# Patient Record
Sex: Female | Born: 1975 | Race: White | Hispanic: No | Marital: Married | Smoking: Never smoker
Health system: Southern US, Community
[De-identification: ages and names within clinical notes are randomized; demographics above are authoritative.]

## PROBLEM LIST (undated history)

## (undated) DIAGNOSIS — D242 Benign neoplasm of left breast: Secondary | ICD-10-CM

## (undated) DIAGNOSIS — Z1379 Encounter for other screening for genetic and chromosomal anomalies: Secondary | ICD-10-CM

## (undated) DIAGNOSIS — Z803 Family history of malignant neoplasm of breast: Secondary | ICD-10-CM

## (undated) DIAGNOSIS — R897 Abnormal histological findings in specimens from other organs, systems and tissues: Secondary | ICD-10-CM

## (undated) DIAGNOSIS — Z9189 Other specified personal risk factors, not elsewhere classified: Secondary | ICD-10-CM

## (undated) DIAGNOSIS — G43909 Migraine, unspecified, not intractable, without status migrainosus: Secondary | ICD-10-CM

## (undated) DIAGNOSIS — Z1502 Genetic susceptibility to malignant neoplasm of ovary: Secondary | ICD-10-CM

## (undated) DIAGNOSIS — Z9221 Personal history of antineoplastic chemotherapy: Secondary | ICD-10-CM

## (undated) DIAGNOSIS — Z8041 Family history of malignant neoplasm of ovary: Secondary | ICD-10-CM

## (undated) DIAGNOSIS — Z1501 Genetic susceptibility to malignant neoplasm of breast: Secondary | ICD-10-CM

## (undated) DIAGNOSIS — N6320 Unspecified lump in the left breast, unspecified quadrant: Secondary | ICD-10-CM

## (undated) DIAGNOSIS — Z923 Personal history of irradiation: Secondary | ICD-10-CM

## (undated) DIAGNOSIS — Z1589 Genetic susceptibility to other disease: Secondary | ICD-10-CM

## (undated) DIAGNOSIS — C50919 Malignant neoplasm of unspecified site of unspecified female breast: Secondary | ICD-10-CM

## (undated) DIAGNOSIS — Z1509 Genetic susceptibility to other malignant neoplasm: Secondary | ICD-10-CM

## (undated) DIAGNOSIS — I10 Essential (primary) hypertension: Secondary | ICD-10-CM

## (undated) HISTORY — DX: Family history of malignant neoplasm of breast: Z80.3

## (undated) HISTORY — DX: Migraine, unspecified, not intractable, without status migrainosus: G43.909

## (undated) HISTORY — PX: BREAST EXCISIONAL BIOPSY: SUR124

## (undated) HISTORY — DX: Unspecified lump in the left breast, unspecified quadrant: N63.20

## (undated) HISTORY — DX: Genetic susceptibility to other disease: Z15.89

## (undated) HISTORY — DX: Abnormal histological findings in specimens from other organs, systems and tissues: R89.7

## (undated) HISTORY — DX: Genetic susceptibility to malignant neoplasm of breast: Z15.01

## (undated) HISTORY — DX: Benign neoplasm of left breast: D24.2

## (undated) HISTORY — DX: Genetic susceptibility to other malignant neoplasm: Z15.09

## (undated) HISTORY — DX: Encounter for other screening for genetic and chromosomal anomalies: Z13.79

## (undated) HISTORY — DX: Other specified personal risk factors, not elsewhere classified: Z91.89

## (undated) HISTORY — DX: Genetic susceptibility to malignant neoplasm of ovary: Z15.02

## (undated) HISTORY — DX: Family history of malignant neoplasm of ovary: Z80.41

---

## 1998-08-18 HISTORY — PX: BREAST SURGERY: SHX581

## 2005-04-27 ENCOUNTER — Inpatient Hospital Stay: Payer: Self-pay

## 2008-10-30 ENCOUNTER — Observation Stay: Payer: Self-pay

## 2008-11-08 ENCOUNTER — Inpatient Hospital Stay: Payer: Self-pay | Admitting: Obstetrics & Gynecology

## 2010-01-20 ENCOUNTER — Encounter (INDEPENDENT_AMBULATORY_CARE_PROVIDER_SITE_OTHER): Payer: Self-pay | Admitting: *Deleted

## 2010-07-19 NOTE — Letter (Signed)
Summary: Nadara Eaton letter  [place] at [facility]  [address] [place], [place] [address]   Phone: [phone]  Fax: [phone]       01/20/2010 MRN: [id number]  [facility] [address] [place], [place]  [address]  Dear Ms. [facility] [place], and [place] announce the retirement of Arta Silence, M.D., from full-time practice at the [facility] office effective December 16, 2009 and his plans of returning part-time.  It is important to Dr. Hetty Ely and to our practice that you understand that [facility] has seven physicians in our office for your health care needs.  We will continue to offer the same exceptional care that you have today.    Dr. Hetty Ely has spoken to many of you about his plans for retirement and returning part-time in the fall.   We will continue to work with you through the transition to schedule appointments for you in the office and meet the high standards that [facility] is committed to.   Again, it is with great pleasure that we share the news that Dr. Hetty Ely will return to [facility] at [facility] in October of 2011 with a reduced schedule.    If you have any questions, or would like to request an appointment with one of our physicians, please call us at [phone] and press the option for Scheduling an appointment.  We take pleasure in providing you with excellent patient care and look forward to seeing you at your next office visit.  Our [facility] Physicians are:  Tillman Abide, M.D. Laurita Quint, M.D. Roxy Manns, M.D. Kerby Nora, M.D. Hannah Beat, M.D. Ruthe Mannan, M.D. We proudly welcomed Raechel Ache, M.D. and Eustaquio Boyden, M.D. to the practice in July/August 2011.  Sincerely,  [facility]

## 2013-07-02 ENCOUNTER — Ambulatory Visit: Payer: Self-pay

## 2013-07-23 ENCOUNTER — Ambulatory Visit: Payer: Self-pay | Admitting: General Surgery

## 2013-07-30 ENCOUNTER — Ambulatory Visit: Payer: Self-pay | Admitting: General Surgery

## 2013-07-30 ENCOUNTER — Ambulatory Visit (INDEPENDENT_AMBULATORY_CARE_PROVIDER_SITE_OTHER): Payer: BC Managed Care – PPO | Admitting: General Surgery

## 2013-07-30 ENCOUNTER — Encounter: Payer: Self-pay | Admitting: General Surgery

## 2013-07-30 ENCOUNTER — Other Ambulatory Visit: Payer: BC Managed Care – PPO

## 2013-07-30 VITALS — BP 128/84 | HR 82 | Resp 14 | Ht 67.0 in | Wt 165.0 lb

## 2013-07-30 DIAGNOSIS — N63 Unspecified lump in unspecified breast: Secondary | ICD-10-CM

## 2013-07-30 DIAGNOSIS — D249 Benign neoplasm of unspecified breast: Secondary | ICD-10-CM

## 2013-07-30 NOTE — Patient Instructions (Addendum)
Continue self breast exams. Call office for any new breast issues or concerns.  Patient's surgery has been scheduled for 08-08-13 at [facility].

## 2013-07-30 NOTE — Progress Notes (Signed)
Patient ID: Denise [name], female   DOB: 1976-02-05, 38 y.o.   MRN: [id number]  Chief Complaint  Patient presents with  . Breast Problem    mammogram, left breast lump    HPI Denise [name] is a 38 y.o. female.  who presents for a breast evaluation. The most recent mammogram was done on 06-20-13. Left breast ultrasound was 07-02-13. The patient is accompanied by her husband, Jaqlyn Gruenhagen, who was present for the interview and exam.   Patient does perform regular self breast checks and gets regular mammograms done.  States she can feel a lump in left breast since about November. Does not feel it has changed in size. Denies any breast injuries or pain. Recovering from upper respiratory sinus congestion.  The patient has 2 daughters, age 43 and 30. She nursed each without difficulty.  HPI  No past medical history on file.  Past Surgical History  Procedure Laterality Date  . Breast surgery Right March 2000    benign    Family History  Problem Relation Age of Onset  . Cancer Paternal Grandmother     breast  . Cancer Maternal Aunt     breast    Social History History  Substance Use Topics  . Smoking status: Never Smoker   . Smokeless tobacco: Not on file  . Alcohol Use: Yes     Comment: occasionally    No Known Allergies  Current Outpatient Prescriptions  Medication Sig Dispense Refill  . TRI-SPRINTEC 0.18/0.215/0.25 MG-35 MCG tablet Take 1 tablet by mouth daily.       No current facility-administered medications for this visit.    Review of Systems Review of Systems  Constitutional: Negative.   Respiratory: Negative.   Cardiovascular: Negative.     Blood pressure 128/84, pulse 82, resp. rate 14, height 5\' 7"  (1.702 m), weight 165 lb (74.844 kg), last menstrual period 07/13/2013.  Physical Exam Physical Exam  Constitutional: She is oriented to person, place, and time. She appears well-developed and well-nourished.  Neck: Neck supple.  Cardiovascular: Normal  rate, regular rhythm and normal heart sounds.   Pulmonary/Chest: Effort normal and breath sounds normal. Right breast exhibits no inverted nipple, no mass, no nipple discharge, no skin change and no tenderness. Left breast exhibits mass. Left breast exhibits no inverted nipple, no nipple discharge, no skin change and no tenderness.    Well heal circular areolar scar 12 to 3 o'clock  Right breast Left breast 4 CFN a 1.5 cm nodule  Lymphadenopathy:    She has no cervical adenopathy.    She has no axillary adenopathy.  Neurological: She is alert and oriented to person, place, and time.  Skin: Skin is warm and dry.    Data Reviewed Original mammogram dated 06/20/2013 showed dense breast with a suggestion of 2 nodular densities in the left. BI-RAD-0.  Focal spot compression views and ultrasound dated 07/02/2013 confirmed to well-circumscribed nodules. The palpable abnormality at 4:00 measuring 1.0 x 1.3 x 1.5 cm and at the 3:00 position, slightly deeper in the breast parenchyma a 0.8 x 1.8 x 2.1 cm nodules. These were thought to likely represent fibroadenomas.  Assessment    Left breast fibroadenoma. Past history right breast benign nodule excision.    Plan    Options for management were reviewed including 1) core biopsy of the larger lesion, was scheduled observation of both lesions; 2) core biopsy of the smaller, palpable lesion, 3) observation as recommended by the radiologist or 4) surgical excision  of both lesions. The patient has expressed a strong desire to proceed to excision.  The risks associated with surgery including those of bleeding, infection and less likely breast deformity were reviewed.  She expressed an interest in having a sedation, that reason the procedure would be completed as an outpatient at the hospital.    Patient's surgery has been scheduled for 08-08-13 at [facility].   [name] 07/31/2013, 8:40 AM

## 2013-07-31 ENCOUNTER — Other Ambulatory Visit: Payer: Self-pay | Admitting: General Surgery

## 2013-07-31 DIAGNOSIS — D249 Benign neoplasm of unspecified breast: Secondary | ICD-10-CM | POA: Insufficient documentation

## 2013-08-08 ENCOUNTER — Ambulatory Visit: Payer: Self-pay | Admitting: General Surgery

## 2013-08-08 DIAGNOSIS — N63 Unspecified lump in unspecified breast: Secondary | ICD-10-CM

## 2013-08-08 DIAGNOSIS — D249 Benign neoplasm of unspecified breast: Secondary | ICD-10-CM

## 2013-08-08 DIAGNOSIS — N6089 Other benign mammary dysplasias of unspecified breast: Secondary | ICD-10-CM | POA: Insufficient documentation

## 2013-08-08 HISTORY — PX: BREAST SURGERY: SHX581

## 2013-08-11 ENCOUNTER — Encounter: Payer: Self-pay | Admitting: General Surgery

## 2013-08-12 ENCOUNTER — Encounter: Payer: Self-pay | Admitting: General Surgery

## 2013-08-12 ENCOUNTER — Telehealth: Payer: Self-pay | Admitting: *Deleted

## 2013-08-12 LAB — PATHOLOGY REPORT

## 2013-08-12 NOTE — Progress Notes (Signed)
Patient ID: Denise [name], female   DOB: 12/25/1975, 38 y.o.   MRN: [id number]   Pathology report from the 08/08/2013 left breast biopsies showed a fibroadenoma at 3:00 with focal epithelial atypia. Margins were clear. The lesion at 4:00 was a simple fibroadenoma. The retroareolar tissue showed columnar cell changes without atypia.  The patient's [name] model risk for malignancy as 2% over the next 5 years, 43% lifetime. Will review the possibility of antiestrogen therapy at the time of her followup exam.  Family history is notable for a hand with breast cancer, no first degree relatives.

## 2013-08-12 NOTE — Telephone Encounter (Signed)
Message copied by Carson Myrtle on Tue Aug 12, 2013  2:04 PM ------      Message from: [place], [place]      Created: Tue Aug 12, 2013  1:55 PM       5 the patient that her recently completed breast biopsies of February 20 were all benign. Followup as scheduled. Thank you ------

## 2013-08-12 NOTE — Telephone Encounter (Signed)
Notified patient as instructed, patient pleased. Discussed follow-up appointments, patient agrees. Pt aware of weather forecast and may call sooner if needed to change appointment.

## 2013-08-14 ENCOUNTER — Ambulatory Visit: Payer: BC Managed Care – PPO | Admitting: General Surgery

## 2013-08-18 ENCOUNTER — Encounter: Payer: Self-pay | Admitting: General Surgery

## 2013-08-18 ENCOUNTER — Ambulatory Visit (INDEPENDENT_AMBULATORY_CARE_PROVIDER_SITE_OTHER): Payer: BC Managed Care – PPO | Admitting: General Surgery

## 2013-08-18 VITALS — BP 118/76 | HR 78 | Resp 12 | Ht 67.0 in | Wt 168.0 lb

## 2013-08-18 DIAGNOSIS — D249 Benign neoplasm of unspecified breast: Secondary | ICD-10-CM

## 2013-08-18 DIAGNOSIS — N6089 Other benign mammary dysplasias of unspecified breast: Secondary | ICD-10-CM

## 2013-08-18 NOTE — Patient Instructions (Addendum)
Follow up appointment to be announced. The patient is aware to use a heating pad as needed for comfort.

## 2013-08-18 NOTE — Progress Notes (Signed)
Patient ID: Denise [name], female   DOB: [birth year], 38 y.o.   MRN: [id number]  Chief Complaint  Patient presents with  . Routine Post Op    left breast excision    HPI Denise [name] is a 38 y.o. female here today for her post op left breast excision done on 08/08/13. Patient states she is doing well . HPI  No past medical history on file.  Past Surgical History  Procedure Laterality Date  . Breast surgery Right March 2000    benign  . Breast surgery Left 08/08/13    excision    Family History  Problem Relation Age of Onset  . Cancer Paternal Grandmother     breast  . Cancer Maternal Aunt     breast  . Bone cancer Paternal Aunt     Social History History  Substance Use Topics  . Smoking status: Never Smoker   . Smokeless tobacco: Never Used  . Alcohol Use: Yes     Comment: occasionally    No Known Allergies  Current Outpatient Prescriptions  Medication Sig Dispense Refill  . TRI-SPRINTEC 0.18/0.215/0.25 MG-35 MCG tablet Take 1 tablet by mouth daily.      Marland Kitchen HYDROcodone-acetaminophen (NORCO/VICODIN) 5-325 MG per tablet Take 1 tablet by mouth as needed.       No current facility-administered medications for this visit.    Review of Systems Review of Systems  Constitutional: Negative.   Respiratory: Negative.   Cardiovascular: Negative.     Blood pressure 118/76, pulse 78, resp. rate 12, height 5\' 7"  (1.702 m), weight 168 lb (76.204 kg), last menstrual period 07/13/2013.  Physical Exam Physical Exam  Constitutional: She is oriented to person, place, and time. She appears well-developed and well-nourished.  Pulmonary/Chest:  Left breast incisions looks clean and healing well. A little bruising noticed.   Neurological: She is alert and oriented to person, place, and time.  Skin: Skin is warm and dry.    Data Reviewed Pathology of the breast tissue showed evidence of flat epithelial hyperplasia with atypia at the 3:00 adjacent to the largest  fibroadenoma with negative margins. Retroareolar tissue at the 4:00 position showed columnar cell changes without atypia and the palpable mass which prompted the initial evaluation showed a simple fibroadenoma the 4:00 position.  [name] model risk analysis shows that she has a five-year risk of malignancy of 2%, lifetime risk of 40% based on her 3 previous biopsies and family history.  Assessment    Doing well status post breast biopsy.    Plan    The NSABP P.-1 chemoprevention trial involved woman's age over 31. The patient meets criteria for chemoprevention. The anticipated 50% reduction from 2% 1% is modest, especially considering it would be necessary for the patient to make use of an alternate form of contraception (presently using OCP) as well as assuming a mild risk of DVT, not greater from that of her present birth control pills. The risk of uterine cancers exceptionally low and menstruating women so this is not so much an issue. There is a potential for vasomotor instability.  We'll obtain a copy of the pathology from the biopsy done of the contralateral breast about 12 years ago when the patient forward to contact information.  We'll present her case at tumor Board for further recommendations regarding genetic screening. (The patient has a maternal grandmother and maternal aunt with breast cancer, but no first degree relatives. There is no family history of colon, pancreas, melanoma or ovarian cancer).  [name] 08/19/2013, 4:55 PM

## 2013-08-21 ENCOUNTER — Telehealth: Payer: Self-pay | Admitting: *Deleted

## 2013-08-21 NOTE — Telephone Encounter (Signed)
Notified patient as instructed, patient pleased. Discussed follow-up appointments, patient agrees  

## 2013-08-21 NOTE — Telephone Encounter (Signed)
Message copied by Carson Myrtle on Thu Aug 21, 2013  4:22 PM ------      Message from: [place], [place]      Created: Thu Aug 21, 2013  2:45 PM       Please notify the patient we received the [place] biopsy report and that I reviewed her situation with the senior medical oncologist for his recommendations re: anti-estrogen treatment.      Would recommend that she have BRCA testing completed and if negative, no additional treatment except careful clinical follow up.        You can arrange if she is amenable. Thanks. ------

## 2013-08-22 ENCOUNTER — Encounter: Payer: Self-pay | Admitting: General Surgery

## 2013-08-22 NOTE — Progress Notes (Signed)
Patient ID: Denise [name], female   DOB: 02-01-1976, 38 y.o.   MRN: [id number]   Outside pathology from [place] dated March 2000 showed multiple fibroadenomas in the right breast measuring from 0.7-2.8 cm.  February 2015 left breast biopsy showed 2 fibroadenomas measuring 1.6 and 2.8 cm respectively. The larger lesion was associated with focal epithelial atypia, margins negative. A separate area biopsy showed columnar cell changes without atypia.  The patient's family history for breast cancer is been clarified: Paternal grandmother, paternal aunt, maternal aunt.  [name] model risk for the patient is to percent 5 years, 40% lifetime. This puts her in a high risk group.  The case was reviewed with the head of medical oncology. No strong recommendation for tamoxifen therapy at this time, but BRCA testing was encouraged.  The patient has been contacted and is amenable to BRCA testing. This will be done next week with a nurse with the patient to be notified of the results when available.

## 2013-08-26 ENCOUNTER — Ambulatory Visit (INDEPENDENT_AMBULATORY_CARE_PROVIDER_SITE_OTHER): Payer: BC Managed Care – PPO | Admitting: *Deleted

## 2013-08-26 DIAGNOSIS — Z1379 Encounter for other screening for genetic and chromosomal anomalies: Secondary | ICD-10-CM

## 2013-08-26 DIAGNOSIS — N6089 Other benign mammary dysplasias of unspecified breast: Secondary | ICD-10-CM

## 2013-08-26 HISTORY — DX: Encounter for other screening for genetic and chromosomal anomalies: Z13.79

## 2013-08-26 NOTE — Progress Notes (Signed)
BRACA testing completed

## 2013-08-26 NOTE — Patient Instructions (Signed)
As scheduled

## 2013-09-05 ENCOUNTER — Telehealth: Payer: Self-pay | Admitting: General Surgery

## 2013-09-05 NOTE — Telephone Encounter (Signed)
Notified BRCA testing negative.

## 2013-09-09 ENCOUNTER — Encounter: Payer: Self-pay | Admitting: General Surgery

## 2014-07-24 ENCOUNTER — Ambulatory Visit: Payer: Self-pay

## 2014-08-11 ENCOUNTER — Ambulatory Visit (INDEPENDENT_AMBULATORY_CARE_PROVIDER_SITE_OTHER): Payer: BC Managed Care – PPO | Admitting: General Surgery

## 2014-08-11 ENCOUNTER — Encounter: Payer: Self-pay | Admitting: General Surgery

## 2014-08-11 VITALS — BP 124/72 | HR 76 | Resp 12 | Ht 67.0 in | Wt 178.0 lb

## 2014-08-11 DIAGNOSIS — D249 Benign neoplasm of unspecified breast: Secondary | ICD-10-CM

## 2014-08-11 NOTE — Progress Notes (Signed)
Patient ID: Denise [name], female   DOB: 02/23/1976, 39 y.o.   MRN: [id number]  Chief Complaint  Patient presents with  . Follow-up    mammogram    HPI Denise [name] is a 39 y.o. female who presents for a breast evaluation. The most recent mammogram was done on 07/24/14 . Patient states she has not feel any lumps in her breast. She states a little tenderness since her mammogram. Patient does perform regular self breast checks and gets regular mammograms done.    HPI  No past medical history on file.  Past Surgical History  Procedure Laterality Date  . Breast surgery Right March 2000    benign  . Breast surgery Left 08/08/13    excision    Family History  Problem Relation Age of Onset  . Cancer Paternal Grandmother     breast  . Cancer Maternal Aunt     breast  . Breast cancer Paternal Aunt 15    Social History History  Substance Use Topics  . Smoking status: Never Smoker   . Smokeless tobacco: Never Used  . Alcohol Use: Yes     Comment: occasionally    No Known Allergies  Current Outpatient Prescriptions  Medication Sig Dispense Refill  . TRI-SPRINTEC 0.18/0.215/0.25 MG-35 MCG tablet Take 1 tablet by mouth daily.     No current facility-administered medications for this visit.    Review of Systems Review of Systems  Constitutional: Negative.   Respiratory: Negative.   Cardiovascular: Negative.     Blood pressure 124/72, pulse 76, resp. rate 12, height 5' 7" (1.702 m), weight 178 lb (80.74 kg), last menstrual period 07/21/2014.  Physical Exam Physical Exam  Constitutional: She is oriented to person, place, and time. She appears well-developed and well-nourished.  Neck: Neck supple.  Cardiovascular: Normal rate, regular rhythm and normal heart sounds.   Pulmonary/Chest: Effort normal and breath sounds normal. Right breast exhibits no inverted nipple, no mass, no nipple discharge, no skin change and no tenderness. Left breast exhibits no inverted  nipple, no mass, no nipple discharge, no skin change and no tenderness.  Scar left breast 3 o'clock with slight irregularity and thickening  Lymphadenopathy:    She has no cervical adenopathy.    She has no axillary adenopathy.  Neurological: She is alert and oriented to person, place, and time.  Skin: Skin is warm and dry.    Data Reviewed PCP notes reviewed. Reported lifetime risk of breast cancer using the [place] reported 47%. Difficult to believe and a BRCA negative patient.  Recent mammogram and ultrasound completed [place] dated 07/24/2014 ported multiple nodular densities, questionable new versus old.  Ultrasound showed multiple lesions thought likely to be benign. Multiple recommendations for additional imaging studies and/or biopsy and/or observation. BI-RADS-3.  Assessment   Previous biopsies for fibroadenoma. Negative BRCA. Flat atypical changes on prior biopsy. (Margins of resection negative).    Plan    Options for management were reviewed: 1) serial ultrasound exam versus 2) core biopsy versus 3) antiestrogen therapy versus 4) bilateral breast MRI versus 5) bilateral prophylactic mastectomy. Opportunity for plastic surgical consultation was recommended, although I would not recommend mastectomy based on previous biopsy results. The patient was accompanied today by her husband who was present for the interview and exam.      Recommend follow up in 6 months. Discussed biopsy the areas of concern or anti-hormone therapy.  She will call back if she decides to complete a biopsy sooner.  PCP:  Denise [name] 08/12/2014, 7:23 PM

## 2014-08-11 NOTE — Patient Instructions (Signed)
Continue self breast exams. Call office for any new breast issues or concerns. 

## 2014-08-19 ENCOUNTER — Telehealth: Payer: Self-pay | Admitting: *Deleted

## 2014-08-19 NOTE — Telephone Encounter (Signed)
Please arrange for office ultrasound guided biopsy of both breasts when convenient for patient. She can be contacted any time (she is not in the classroom). Thanks.

## 2014-08-19 NOTE — Telephone Encounter (Signed)
Pt called and has decided that she wants to go ahead and have a BX on both breast. She wanted to talk to you about this and then schedule something.

## 2014-08-19 NOTE — Telephone Encounter (Signed)
Patient desires to proceed to biopsy of the areas recently identified. Will plan for a core biopsy of both to establish pathology and proceed from there.

## 2014-08-20 NOTE — Telephone Encounter (Signed)
Patient has been scheduled for 09-08-14 at 10 am. This is per patient's request to wait until after upcoming scheduled trip out of town with husband.

## 2014-09-08 ENCOUNTER — Encounter: Payer: Self-pay | Admitting: General Surgery

## 2014-09-08 ENCOUNTER — Other Ambulatory Visit: Payer: BC Managed Care – PPO

## 2014-09-08 ENCOUNTER — Ambulatory Visit (INDEPENDENT_AMBULATORY_CARE_PROVIDER_SITE_OTHER): Payer: BC Managed Care – PPO | Admitting: General Surgery

## 2014-09-08 VITALS — BP 174/68 | HR 74 | Resp 16 | Ht 67.0 in | Wt 174.0 lb

## 2014-09-08 DIAGNOSIS — N641 Fat necrosis of breast: Secondary | ICD-10-CM

## 2014-09-08 DIAGNOSIS — N6320 Unspecified lump in the left breast, unspecified quadrant: Secondary | ICD-10-CM

## 2014-09-08 DIAGNOSIS — N63 Unspecified lump in breast: Secondary | ICD-10-CM | POA: Diagnosis not present

## 2014-09-08 DIAGNOSIS — D249 Benign neoplasm of unspecified breast: Secondary | ICD-10-CM

## 2014-09-08 DIAGNOSIS — N6310 Unspecified lump in the right breast, unspecified quadrant: Secondary | ICD-10-CM

## 2014-09-08 HISTORY — PX: BREAST BIOPSY: SHX20

## 2014-09-08 NOTE — Progress Notes (Addendum)
Patient ID: Denise [name], female   DOB: Jun 23, 1975, 39 y.o.   MRN: [id number]  Chief Complaint  Patient presents with  . Procedure    bilateral biopsy    HPI [name] is a 39 y.o. female here today for a bilateral  breast biopsy. The patient is accompanied by her husband who was present for the procedure. HPI  No past medical history on file.  Past Surgical History  Procedure Laterality Date  . Breast surgery Right March 2000    benign  . Breast surgery Left 08/08/13    excision    Family History  Problem Relation Age of Onset  . Cancer Paternal Grandmother     breast  . Cancer Maternal Aunt     breast  . Breast cancer Paternal Aunt 40    Social History History  Substance Use Topics  . Smoking status: Never Smoker   . Smokeless tobacco: Never Used  . Alcohol Use: Yes     Comment: occasionally    No Known Allergies  Current Outpatient Prescriptions  Medication Sig Dispense Refill  . TRI-SPRINTEC 0.18/0.215/0.25 MG-35 MCG tablet Take 1 tablet by mouth daily.     No current facility-administered medications for this visit.    Review of Systems Review of Systems  Constitutional: Negative.   Respiratory: Negative.   Cardiovascular: Negative.     Blood pressure 174/68, pulse 74, resp. rate 16, height 5\' 7"  (1.702 m), weight 174 lb (78.926 kg), last menstrual period 07/21/2014.  Physical Exam Physical Exam  Pulmonary/Chest:      Data Reviewed Prior mammograms and ultrasound.  Assessment    Likely fat necrosis at the site of her previous wide excision in the 3:00 position of the left breast.  Additional fibroadenomas in the right and left breast.    Plan    The biopsy procedure was reviewed. The patient was amenable to proceed. In the left breast at the 4:00 position, 6 cm from the nipple a multilobulated area that was hypoechoic without significant acoustic enhancement or shadowing measuring in aggregate 1.18 cm in length and up to  0.8 cm in width was identified. At the 3:00 position of the left breast, 3 cm from the nipple a anechoic area with posterior acoustic enhancement at the medial aspect of her previous incision site was identified. On aspiration after alcohol prep this yielded 5 mL of this case fatty fluid consistent with liquefied fat. The area completely resolved.  Biopsy of the left breast lesion was completed making use of a 14-gauge Finesse vacuum-assisted device after the instillation of 10 mL of 0.5% Xylocaine with 0.25% Marcaine with 1-200,000 epinephrine.. This was passed under ultrasound guidance and core samples obtained from both dominant areas within the lesion. A postbiopsy clip was placed.  In the right breast a 0.86 x 1.1 x 1.12 cm well-circumscribed hypoechoic lesion with no stricture acoustic enhancement was identified. This was biopsied making use of a 14-gauge vacuum-assisted Finesse device after the instillation of 10 mL of 0.5% Xylocaine with 0.25% Marcaine with 1-200,000 epinephrine.. . Multiple core samples were obtained from both the superior and inferior aspect of the area. Postbiopsy clip placed.  Skin incisions were closed with benzoin and Steri-Strips followed by Telfa and Tegaderm dressing.  The patient tolerated the procedure well with minimal discomfort and no bleeding.  Postbiopsy instructions were provided.  The patient will follow-up with the nurse next week for wound evaluation. She will be contacted by phone tomorrow when pathology is available.  [name] 09/09/2014, 6:34 AM

## 2014-09-08 NOTE — Patient Instructions (Signed)

## 2014-09-09 DIAGNOSIS — N641 Fat necrosis of breast: Secondary | ICD-10-CM | POA: Insufficient documentation

## 2014-09-15 ENCOUNTER — Ambulatory Visit: Payer: BC Managed Care – PPO

## 2014-09-16 ENCOUNTER — Encounter: Payer: Self-pay | Admitting: General Surgery

## 2014-09-16 ENCOUNTER — Ambulatory Visit (INDEPENDENT_AMBULATORY_CARE_PROVIDER_SITE_OTHER): Payer: BC Managed Care – PPO | Admitting: General Surgery

## 2014-09-16 VITALS — BP 128/72 | HR 78 | Resp 12 | Ht 67.0 in | Wt 179.0 lb

## 2014-09-16 DIAGNOSIS — N6320 Unspecified lump in the left breast, unspecified quadrant: Secondary | ICD-10-CM

## 2014-09-16 DIAGNOSIS — N63 Unspecified lump in breast: Secondary | ICD-10-CM | POA: Diagnosis not present

## 2014-09-16 NOTE — Progress Notes (Signed)
Patient ID: Denise [name], female   DOB: 1975/07/14, 39 y.o.   MRN: [id number]  Chief Complaint  Patient presents with  . Routine Post Op    bilateral breast biopsy    HPI Denise [name] is a 39 y.o. female here today to discuss the path results from her bilateral breast biopsy done on 09/08/14.  HPI  No past medical history on file.  Past Surgical History  Procedure Laterality Date  . Breast surgery Right March 2000    benign  . Breast surgery Left 08/08/13    excision  . Breast biopsy Bilateral 09/08/14    Family History  Problem Relation Age of Onset  . Cancer Paternal Grandmother     breast  . Cancer Maternal Aunt     breast  . Breast cancer Paternal Aunt 19    Social History History  Substance Use Topics  . Smoking status: Never Smoker   . Smokeless tobacco: Never Used  . Alcohol Use: Yes     Comment: occasionally    No Known Allergies  Current Outpatient Prescriptions  Medication Sig Dispense Refill  . TRI-SPRINTEC 0.18/0.215/0.25 MG-35 MCG tablet Take 1 tablet by mouth daily.     No current facility-administered medications for this visit.    Review of Systems Review of Systems  Constitutional: Negative.   Respiratory: Negative.   Cardiovascular: Negative.     Blood pressure 128/72, pulse 78, resp. rate 12, height 5\' 7"  (1.702 m), weight 179 lb (81.194 kg).  Physical Exam Physical Exam  Constitutional: She is oriented to person, place, and time. She appears well-developed and well-nourished.  Eyes: Conjunctivae are normal. No scleral icterus.  Neck: Neck supple.  Cardiovascular: Normal rate, regular rhythm and normal heart sounds.   Pulmonary/Chest: Effort normal and breath sounds normal.  Lymphadenopathy:    She has no cervical adenopathy.  Neurological: She is alert and oriented to person, place, and time.  Skin: Skin is warm and dry.   Patient here today for follow up post bilateral breast biopsy. Minimal bruising noted.  The  patient is aware that a heating pad may be used for comfort as needed.     Data Reviewed Breast, left, needle core biopsy, 4 o'clock - BREAST TISSUE WITH PSEUDOANGIOMATOUS HYPERPLASIA AND BANAL SPINDLE CELL PROLIFERATION  Assessment    Likely fibroadenoma, formal excision recommended with conservative margins on phone review with the pathologist.  Plan    Discussed left breast excision. 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  While this is a superficial lesion, the patient reports that the core biopsy was is far she would like to go under local anesthesia or only receiving oral medications. For that reason I will be completed as an outpatient with sedation.    Patient's surgery has been scheduled for 09-23-14 at [facility].   PCP:  Deliah Boston 09/17/2014, 1:46 PM

## 2014-09-16 NOTE — Patient Instructions (Addendum)
Patient to have a left breast excision at [facility]   Patient's surgery has been scheduled for 09-23-14 at [facility].

## 2014-09-17 ENCOUNTER — Other Ambulatory Visit: Payer: Self-pay | Admitting: General Surgery

## 2014-09-17 DIAGNOSIS — C50419 Malignant neoplasm of upper-outer quadrant of unspecified female breast: Secondary | ICD-10-CM | POA: Insufficient documentation

## 2014-09-17 DIAGNOSIS — Z170 Estrogen receptor positive status [ER+]: Secondary | ICD-10-CM | POA: Insufficient documentation

## 2014-09-17 DIAGNOSIS — N6320 Unspecified lump in the left breast, unspecified quadrant: Secondary | ICD-10-CM

## 2014-09-17 DIAGNOSIS — C50412 Malignant neoplasm of upper-outer quadrant of left female breast: Secondary | ICD-10-CM | POA: Insufficient documentation

## 2014-09-18 HISTORY — PX: BREAST EXCISIONAL BIOPSY: SUR124

## 2014-09-23 ENCOUNTER — Ambulatory Visit: Admit: 2014-09-23 | Disposition: A | Discharge status: Home / Self Care | Payer: Self-pay | Admitting: General Surgery

## 2014-09-23 DIAGNOSIS — D242 Benign neoplasm of left breast: Secondary | ICD-10-CM | POA: Diagnosis not present

## 2014-09-23 HISTORY — PX: BREAST SURGERY: SHX581

## 2014-09-25 ENCOUNTER — Telehealth: Payer: Self-pay | Admitting: General Surgery

## 2014-09-25 NOTE — Telephone Encounter (Signed)
Notified path benign. Report minimal pain.  Follow up as scheduled.

## 2014-09-28 ENCOUNTER — Encounter: Payer: Self-pay | Admitting: General Surgery

## 2014-09-30 ENCOUNTER — Ambulatory Visit: Payer: BC Managed Care – PPO | Admitting: General Surgery

## 2014-09-30 ENCOUNTER — Encounter: Payer: Self-pay | Admitting: General Surgery

## 2014-09-30 ENCOUNTER — Ambulatory Visit (INDEPENDENT_AMBULATORY_CARE_PROVIDER_SITE_OTHER): Payer: BC Managed Care – PPO | Admitting: General Surgery

## 2014-09-30 DIAGNOSIS — D249 Benign neoplasm of unspecified breast: Secondary | ICD-10-CM

## 2014-09-30 NOTE — Patient Instructions (Signed)
Bilateral diagnostic mammograms in October 2016

## 2014-09-30 NOTE — Progress Notes (Signed)
Patient ID: Denise [name], female   DOB: 07-Jun-1976, 39 y.o.   MRN: [id number]  Chief Complaint  Patient presents with  . Follow-up    left breast excision     HPI Denise [name] is a 39 y.o. female here today for her post op left breast excision done on 09/23/14. Patient states she is doing well.  HPI  No past medical history on file.  Past Surgical History  Procedure Laterality Date  . Breast surgery Right March 2000    benign  . Breast surgery Left 08/08/13    excision  . Breast biopsy Bilateral 09/08/14  . Breast surgery Right 09/23/14    excision    Family History  Problem Relation Age of Onset  . Cancer Paternal Grandmother     breast  . Cancer Maternal Aunt     breast  . Breast cancer Paternal Aunt 18    Social History History  Substance Use Topics  . Smoking status: Never Smoker   . Smokeless tobacco: Never Used  . Alcohol Use: Yes     Comment: occasionally    No Known Allergies  Current Outpatient Prescriptions  Medication Sig Dispense Refill  . TRI-SPRINTEC 0.18/0.215/0.25 MG-35 MCG tablet Take 1 tablet by mouth daily.     No current facility-administered medications for this visit.    Review of Systems Review of Systems  Constitutional: Negative.   Respiratory: Negative.   Cardiovascular: Negative.     Blood pressure 124/80, pulse 78, resp. rate 12, height 5\' 7"  (1.702 m), weight 179 lb (81.194 kg).  Physical Exam Physical Exam  Constitutional: She is oriented to person, place, and time. She appears well-developed and well-nourished.  Pulmonary/Chest:    Left breast excision looks clean and healing well.   Neurological: She is alert and oriented to person, place, and time.  Skin: Skin is warm.    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 No residual fibroadenomatous tissue. No atypia.  Assessment    Doing well post biopsy.    Plan    Original evaluation with the area of focal alumni  cell atypia year ago included a discussion of chemoprevention. It was an estimated 1% overall improvement, and this was thought to be overwhelmed by the 1% risk of DVT/PE.  Patient to return in October 2016 bilateral diagnostic mammograms in October 2016     PCP:  Myrene Buddy, [name] 09/30/2014, 10:03 AM

## 2014-10-10 NOTE — Op Note (Signed)
PATIENT NAME:  Denise [name], Denise [name] MR#:  [id number] DATE OF BIRTH:  1976-02-21  DATE OF PROCEDURE:  08/08/2013  PREOPERATIVE DIAGNOSIS: Multiple left breast fibroadenomas.   POSTOPERATIVE DIAGNOSIS: Multiple left breast fibroadenomas.   OPERATIVE PROCEDURE: Excision of left breast fibroadenomas at the 3 o'clock and 4 o'clock position.   SURGEON: Hervey Ard, MD   ANESTHESIA: General by LMA, Marcaine 0.5% with 1:200,000 units epinephrine 27 mL local infiltration.   ESTIMATED BLOOD LOSS: Minimal.   CLINICAL NOTE: This 39 year old woman developed a palpable mass in the 4 o'clock position of the left breast. Subsequent evaluation suggested a second lesion at the 3 o'clock position that was deeper and nonpalpable. She desired excision.   DESCRIPTION OF PROCEDURE: With the patient under adequate general anesthesia, the breast was prepped with ChloraPrep and draped. Ultrasound was used to identify the area at the 3 o'clock position, approximately 4 cm from the nipple. A radial incision was made from the edge of the areola and carried down through the skin and subcutaneous tissue with hemostasis achieved by electrocautery. A multilobulated fibroadenoma was excised, orientated, and sent fresh per protocol. Palpation in the retroareolar space at the 4 o'clock position did not show a clearly definable area of thickening, but a 2 x 2 x 2 volume of tissue was removed and sent as retroareolar tissue. The 4 o'clock lesion was peripherally located 6 cm from the nipple. This was palpated and confirmed by ultrasound. A small radial incision overlying the mass was made and the area was excised and sent fresh for histology. The wounds were closed after assuring good hemostasis making use of 2-0 Vicryl figure-of-eight sutures. Skin incisions were closed with 4-0 Vicryl subcuticular sutures. Benzoin, Steri-Strips, Telfa, and Tegaderm dressings were applied. Telfa, fluff gauze, and the patient's bra were then applied  for dressing. She tolerated the procedure well.  ____________________________ [name], MD jwb:sb D: 08/08/2013 15:46:26 ET T: 08/08/2013 16:27:17 ET JOB#: [id number]  cc: [name], MD, <Dictator> Phillips Goulette Amedeo Kinsman MD ELECTRONICALLY SIGNED 08/08/2013 17:42

## 2014-10-12 LAB — SURGICAL PATHOLOGY

## 2014-10-18 NOTE — Op Note (Signed)
PATIENT NAME:  Denise [name], Denise [name] MR#:  [id number] DATE OF BIRTH:  September 07, 1975  DATE OF PROCEDURE:  09/23/2014  PREOPERATIVE DIAGNOSIS:  Left breast mass.   POSTOPERATIVE DIAGNOSIS:  Left breast mass.  OPERATIVE PROCEDURE:  Left breast biopsy with ultrasound guidance.   SURGEON:  [name], MD   ANESTHESIA:  Attended local, 10 mL of 0.5% Marcaine with 1:200,000 units of epinephrine.   ESTIMATED BLOOD LOSS:  Minimal.   CLINICAL NOTE:  This 39 year old woman had a new mammographic finding of a nodular density in the left breast. Ultrasound showed a multilobulated area at the 4 o'clock position, and biopsy completed on 09/09/2014, showed left breast tissue with pseudoangiomatous hyperplasia, as well as spindle cell proliferation thought to possibly represent a fibroadenoma or less likely a myofibroblastic tumor. Conservative resection had been recommended in discussion with the pathologist.   The patient is brought to the operating room at this time for planned excision.   OPERATIVE NOTE:  After adequate sedation, the breast was prepped with ChloraPrep and draped. The area at the 4 o'clock position, 6 cm from the nipple was again easily identified and the area marked. Local anesthesia was infiltrated and was well tolerated. A radial incision was made over the nodular area. The skin was incised sharply. The remaining dissection was completed with electrocautery. The nodular area was excised, orientated, and sent fresh for routine histology. The wound was approximated with 2-0 Vicryl to the breast parenchyma and then a running 4-0 Vicryl subcuticular suture for the skin. Benzoin, Steri-Strips, Telfa, and Tegaderm dressing was then applied.   The patient tolerated the procedure well and was taken to the recovery room in stable condition.   ____________________________ [name], MD jwb:nb D: 09/23/2014 21:42:36 ET T: 09/24/2014 06:25:57 ET JOB#: [id number]  cc: [name], MD, <Dictator> Deirdre Evener. Copland, PA Shiloh Swopes Amedeo Kinsman MD ELECTRONICALLY SIGNED 09/24/2014 19:05

## 2015-01-26 ENCOUNTER — Other Ambulatory Visit: Payer: Self-pay

## 2015-01-26 DIAGNOSIS — D242 Benign neoplasm of left breast: Secondary | ICD-10-CM

## 2015-02-18 ENCOUNTER — Ambulatory Visit: Payer: Self-pay

## 2015-02-18 ENCOUNTER — Other Ambulatory Visit: Payer: Self-pay

## 2015-02-19 ENCOUNTER — Other Ambulatory Visit: Payer: Self-pay | Admitting: General Surgery

## 2015-02-19 ENCOUNTER — Ambulatory Visit
Admission: RE | Admit: 2015-02-19 | Discharge: 2015-02-19 | Disposition: A | Discharge status: Home / Self Care | Payer: BC Managed Care – PPO | Source: Ambulatory Visit | Attending: General Surgery | Admitting: General Surgery

## 2015-02-19 DIAGNOSIS — N63 Unspecified lump in breast: Secondary | ICD-10-CM | POA: Insufficient documentation

## 2015-02-19 DIAGNOSIS — Z09 Encounter for follow-up examination after completed treatment for conditions other than malignant neoplasm: Secondary | ICD-10-CM | POA: Diagnosis present

## 2015-02-19 DIAGNOSIS — D242 Benign neoplasm of left breast: Secondary | ICD-10-CM | POA: Diagnosis present

## 2015-02-19 DIAGNOSIS — Z803 Family history of malignant neoplasm of breast: Secondary | ICD-10-CM | POA: Insufficient documentation

## 2015-02-19 DIAGNOSIS — Z808 Family history of malignant neoplasm of other organs or systems: Secondary | ICD-10-CM | POA: Insufficient documentation

## 2015-02-25 ENCOUNTER — Ambulatory Visit (INDEPENDENT_AMBULATORY_CARE_PROVIDER_SITE_OTHER): Payer: BC Managed Care – PPO | Admitting: General Surgery

## 2015-02-25 ENCOUNTER — Encounter: Payer: Self-pay | Admitting: General Surgery

## 2015-02-25 VITALS — BP 132/74 | HR 72 | Resp 16 | Ht 67.0 in | Wt 174.0 lb

## 2015-02-25 DIAGNOSIS — N63 Unspecified lump in breast: Secondary | ICD-10-CM | POA: Diagnosis not present

## 2015-02-25 DIAGNOSIS — N6320 Unspecified lump in the left breast, unspecified quadrant: Secondary | ICD-10-CM

## 2015-02-25 DIAGNOSIS — R928 Other abnormal and inconclusive findings on diagnostic imaging of breast: Secondary | ICD-10-CM | POA: Diagnosis not present

## 2015-02-25 NOTE — Patient Instructions (Addendum)
The patient is aware to call back for any questions or concerns.  Patient's surgery has been scheduled for 03-16-15 at [facility].

## 2015-02-25 NOTE — Progress Notes (Signed)
Patient ID: Denise [name], female   DOB: [birth year], 39 y.o.   MRN: [id number]  Chief Complaint  Patient presents with  . Follow-up    mammogram 02/19/15    HPI Denise [name] is a 39 y.o. female who presents for a breast evaluation. The most recent left breast mammogram and ultrasound was done on 02/19/15. Patient typically performs regular self breast checks and gets regular mammograms done. Patient states there are no new breast problems. The patient had undergone biopsies of the left breast earlier this year, and recently completed her first post biopsy imaging. She was distressed by the radiologist report, and especially stressed because the mother of a dear friend, Denise [name] had died that very same day from advanced breast cancer. The patient was accompanied today by her husband, Denise [name], who was present for the interview and exam.   HPI  No past medical history on file.  Past Surgical History  Procedure Laterality Date  . Breast surgery Right March 2000    benign fibroadenoma  . Breast surgery Left 08/08/13    excision  . Breast surgery Left 09/23/14    excision  . Breast biopsy Bilateral 09/08/14    neg    Family History  Problem Relation Age of Onset  . Cancer Paternal Grandmother     breast  . Cancer Maternal Aunt     breast  . Breast cancer Paternal Aunt 31    Social History Social History  Substance Use Topics  . Smoking status: Never Smoker   . Smokeless tobacco: Never Used  . Alcohol Use: Yes     Comment: occasionally    No Known Allergies  Current Outpatient Prescriptions  Medication Sig Dispense Refill  . TRI-SPRINTEC 0.18/0.215/0.25 MG-35 MCG tablet Take 1 tablet by mouth daily.     No current facility-administered medications for this visit.    Review of Systems Review of Systems  Constitutional: Negative.   Respiratory: Negative.   Cardiovascular: Negative.     Blood pressure 132/74, pulse 72, resp. rate 16, height _0  (1.702  m), weight 174 lb (78.926 kg), last menstrual period 01/31/2015.  Physical Exam Physical Exam  Constitutional: She is oriented to person, place, and time. She appears well-developed and well-nourished.  HENT:  Mouth/Throat: Oropharynx is clear and moist.  Eyes: Conjunctivae are normal. No scleral icterus.  Neck: Neck supple.  Cardiovascular: Normal rate, regular rhythm and normal heart sounds.   Pulmonary/Chest: Effort normal and breath sounds normal. Right breast exhibits no inverted nipple, no mass, no nipple discharge, no skin change and no tenderness. Left breast exhibits no inverted nipple, no mass, no nipple discharge, no skin change and no tenderness.    Lymphadenopathy:    She has no cervical adenopathy.    She has no axillary adenopathy.       Left: No supraclavicular adenopathy present.  Neurological: She is alert and oriented to person, place, and time.  Skin: Skin is warm and dry.  Psychiatric: Her behavior is normal.    Data Reviewed Left breast mammogram dated 02/19/2015 was reviewed as well as the report.: Biopsy clip noted in the 4:00 position of the left breast. Low-attenuation area in the lower outer quadrant unchanged.  Ultrasound at the 4:00 position, 6 cm from the left breast reported a 5 x 9 x 12 mm area which corresponds to the mammographic location of the March 2016 excision. (Biopsy site confirmed at the time of excisional biopsy completed on 09/23/2014). Periareolar intraductal solid hypoechoic  non-shadowing mass at 3:00 measuring 5 x 9 x 9 cm. (Site corresponds to location of aspiration site from 09/08/2014 at the 3:00 position of the left breast at which time liquefied fat was aspirated.) Incidentally noted a posterior lower outer quadrant mass in the left breast which is reported stable 6 month. BI-RADS-4.   Assessment    Patient with a history of multiple fibroadenomas of the left breast as well as confirmed benign papilloma of the right  breast.  Biopsy evidence of removal of the original site in the lower outer quadrant of the left breast based on identification of the biopsy cavity, albeit without the marking clip which may have migrated.  Retroareolar deformity at the site of previous excision of retroareolar tissue with findings of columnar cell changes.  Incidentally identified nodular density, 1 cm oval mass.    Plan    The patient had previously been classified at high risk for breast cancer based on ISIS algorithm with her GYN PCP. She is BRCA negative. Based on 4 previous biopsies (not including reexcision) and the findings of atypia, her 5 year [name] model risk is 2.3%, 32% lifetime risk. We had discussed chemoprevention at the time of the April 2016 visit, but with an estimated 1% overall risk reduction this was thought to be overshadowed by 1% risk of DVT/pulomonary embolus.  When the patient was originally evaluated she had toyed with the idea of prophylactic mastectomy. As this time, she is not pushing in this direction.  We'll arrange to have both the previously placed biopsy clip as well as the solid nodular density located with wires prior to excision. We'll assess the retroareolar area at the time of surgery, anticipating this is likely postsurgical change.      Plan left breast biopsy with needle localization at [facility] Day Surgery.  Patient's surgery has been scheduled for 03-16-15 at [facility]   PCP: Denise Perfect, PA  Denise [name] 02/26/2015, 8:22 AM

## 2015-02-26 ENCOUNTER — Encounter: Payer: Self-pay | Admitting: General Surgery

## 2015-02-26 DIAGNOSIS — R928 Other abnormal and inconclusive findings on diagnostic imaging of breast: Secondary | ICD-10-CM | POA: Insufficient documentation

## 2015-02-26 NOTE — H&P (Signed)
HPI  Denise [name] is a 39 y.o. female who presents for a breast evaluation. The most recent left breast mammogram and ultrasound was done on 02/19/15. Patient typically performs regular self breast checks and gets regular mammograms done. Patient states there are no new breast problems.  The patient had undergone biopsies of the left breast earlier this year, and recently completed her first post biopsy imaging. She was distressed by the radiologist report, and especially stressed because the mother of a dear friend, [name] had died that very same day from advanced breast cancer.  The patient was accompanied today by her husband, Linna Hoff, who was present for the interview and exam.  HPI  No past medical history on file.  Past Surgical History   Procedure  Laterality  Date   .  Breast surgery  Right  March 2000     benign fibroadenoma   .  Breast surgery  Left  08/08/13     excision   .  Breast surgery  Left  09/23/14     excision   .  Breast biopsy  Bilateral  09/08/14     neg    Family History   Problem  Relation  Age of Onset   .  Cancer  Paternal Grandmother      breast   .  Cancer  Maternal Aunt      breast   .  Breast cancer  Paternal Aunt  67    Social History  Social History   Substance Use Topics   .  Smoking status:  Never Smoker   .  Smokeless tobacco:  Never Used   .  Alcohol Use:  Yes      Comment: occasionally    No Known Allergies  Current Outpatient Prescriptions   Medication  Sig  Dispense  Refill   .  TRI-SPRINTEC 0.18/0.215/0.25 MG-35 MCG tablet  Take 1 tablet by mouth daily.      No current facility-administered medications for this visit.    Review of Systems  Review of Systems  Constitutional: Negative.  Respiratory: Negative.  Cardiovascular: Negative.   Blood pressure 132/74, pulse 72, resp. rate 16, height _0  (1.702 m), weight 174 lb (78.926 kg), last menstrual period 01/31/2015.  Physical Exam  Physical Exam  Constitutional: She is  oriented to person, place, and time. She appears well-developed and well-nourished.  HENT:  Mouth/Throat: Oropharynx is clear and moist.  Eyes: Conjunctivae are normal. No scleral icterus.  Neck: Neck supple.  Cardiovascular: Normal rate, regular rhythm and normal heart sounds.  Pulmonary/Chest: Effort normal and breath sounds normal. Right breast exhibits no inverted nipple, no mass, no nipple discharge, no skin change and no tenderness. Left breast exhibits no inverted nipple, no mass, no nipple discharge, no skin change and no tenderness.    Lymphadenopathy:  She has no cervical adenopathy.  She has no axillary adenopathy.  Left: No supraclavicular adenopathy present.  Neurological: She is alert and oriented to person, place, and time.  Skin: Skin is warm and dry.  Psychiatric: Her behavior is normal.   Data Reviewed  Left breast mammogram dated 02/19/2015 was reviewed as well as the report.: Biopsy clip noted in the 4:00 position of the left breast. Low-attenuation area in the lower outer quadrant unchanged.  Ultrasound at the 4:00 position, 6 cm from the left breast reported a 5 x 9 x 12 mm area which corresponds to the mammographic location of the March 2016 excision. (Biopsy site confirmed  at the time of excisional biopsy completed on 09/23/2014).  Periareolar intraductal solid hypoechoic non-shadowing mass at 3:00 measuring 5 x 9 x 9 cm. (Site corresponds to location of aspiration site from 09/08/2014 at the 3:00 position of the left breast at which time liquefied fat was aspirated.)  Incidentally noted a posterior lower outer quadrant mass in the left breast which is reported stable 6 month. BI-RADS-4.  Assessment   Patient with a history of multiple fibroadenomas of the left breast as well as confirmed benign papilloma of the right breast.  Biopsy evidence of removal of the original site in the lower outer quadrant of the left breast based on identification of the biopsy cavity,  albeit without the marking clip which may have migrated.  Retroareolar deformity at the site of previous excision of retroareolar tissue with findings of columnar cell changes.  Incidentally identified nodular density, 1 cm oval mass.   Plan   The patient had previously been classified at high risk for breast cancer based on ISIS algorithm with her GYN PCP. She is BRCA negative.  Based on 4 previous biopsies (not including reexcision) and the findings of atypia, her 5 year [name] model risk is 2.3%, 32% lifetime risk.  We had discussed chemoprevention at the time of the April 2016 visit, but with an estimated 1% overall risk reduction this was thought to be overshadowed by 1% risk of DVT/pulomonary embolus.  When the patient was originally evaluated she had toyed with the idea of prophylactic mastectomy. As this time, she is not pushing in this direction.  We'll arrange to have both the previously placed biopsy clip as well as the solid nodular density located with wires prior to excision. We'll assess the retroareolar area at the time of surgery, anticipating this is likely postsurgical change.   Plan left breast biopsy with needle localization at Same Day Surgery.  Patient's surgery has been scheduled for 03-16-15 at [facility].  PCP: Ardeth Perfect, PA  [name]  02/26/2015, 8:22 AM

## 2015-03-01 ENCOUNTER — Telehealth: Payer: Self-pay | Admitting: *Deleted

## 2015-03-01 ENCOUNTER — Telehealth: Payer: Self-pay | Admitting: General Surgery

## 2015-03-01 ENCOUNTER — Other Ambulatory Visit: Payer: Self-pay | Admitting: General Surgery

## 2015-03-01 DIAGNOSIS — R928 Other abnormal and inconclusive findings on diagnostic imaging of breast: Secondary | ICD-10-CM

## 2015-03-01 NOTE — Telephone Encounter (Signed)
-----   Message from [name], MD sent at 02/26/2015  8:59 AM EDT ----- Please notify radiology that both the previously placed biopsy clip as well as the new nodular density in the lower outer quadrant of the left breast need to be localized. Thank you

## 2015-03-01 NOTE — Telephone Encounter (Signed)
Patient asked to call back regarding MRI appropriateness.

## 2015-03-01 NOTE — Telephone Encounter (Signed)
Christy at the [facility] has been notified and needle localization has been scheduled accordingly.

## 2015-03-01 NOTE — Telephone Encounter (Signed)
Patient notified to be at the [facility] day of surgery, 03-16-15 at 9 am. She verbalizes understanding.   Also, questioning if she needs to have a breast MRI completed prior to surgery.

## 2015-03-01 NOTE — Telephone Encounter (Signed)
The patient had questions regarding whether an MRI would be of benefit prior to planned biopsy of the left breast. I think there is about a 5% chance that the MRI which shows something meaningful, 20% chance that she will wind up with an additional biopsy that was not malignant and a 75% chance that no new lesions would be appreciated. It would not change plans for the presently scheduled biopsy.  I'm not opposed to the study, but the patient is aware of a high false positive rate.  She'll consider her options and notify me in the near future. This will allow adequate time for scheduling and biopsy prior to her planned open surgical biopsy at the end of this month.

## 2015-03-02 ENCOUNTER — Telehealth: Payer: Self-pay | Admitting: *Deleted

## 2015-03-02 DIAGNOSIS — R928 Other abnormal and inconclusive findings on diagnostic imaging of breast: Secondary | ICD-10-CM

## 2015-03-02 DIAGNOSIS — N63 Unspecified lump in unspecified breast: Secondary | ICD-10-CM

## 2015-03-02 NOTE — Telephone Encounter (Signed)
Patient wanted to let you know that she wants to go ahead to proceed with the breast MRI. Also she wanted to let you know that she has been singing "meat loaf" all night!

## 2015-03-02 NOTE — Telephone Encounter (Signed)
We'll arrange for bilateral breast MRI prior to her upcoming surgical excision. Indication: Multiple breast masses, multiple previous biopsies. Increased risk for malignancy [name] model Denise [name] (.

## 2015-03-03 ENCOUNTER — Ambulatory Visit: Payer: BC Managed Care – PPO | Admitting: General Surgery

## 2015-03-03 ENCOUNTER — Other Ambulatory Visit: Payer: Self-pay | Admitting: *Deleted

## 2015-03-03 ENCOUNTER — Other Ambulatory Visit: Payer: Self-pay | Admitting: General Surgery

## 2015-03-03 DIAGNOSIS — R9402 Abnormal brain scan: Secondary | ICD-10-CM

## 2015-03-03 DIAGNOSIS — R928 Other abnormal and inconclusive findings on diagnostic imaging of breast: Secondary | ICD-10-CM

## 2015-03-03 DIAGNOSIS — N63 Unspecified lump in unspecified breast: Secondary | ICD-10-CM

## 2015-03-03 DIAGNOSIS — R9409 Abnormal results of other function studies of central nervous system: Principal | ICD-10-CM

## 2015-03-03 NOTE — Telephone Encounter (Signed)
Per new protocol at [facility], they wish for patient to call and arrange breast MRI.   Patient aware this needs to be completed prior to surgery that is scheduled at [facility] for 03-16-15.  This patient reports LMP was 02-28-15.

## 2015-03-03 NOTE — Progress Notes (Signed)
Patient has been scheduled for a bilateral breast MRI at the [facility] for 03-13-15 at 4 pm.  Per [name] at the [facility], patient will need to have a unilateral right breast mammogram prior to breast MRI.   Patient has been scheduled for a mammogram at the [facility] for 03-08-15 at 10 am. This patient is aware of date, time, and instructions.

## 2015-03-08 ENCOUNTER — Ambulatory Visit
Admission: RE | Admit: 2015-03-08 | Discharge: 2015-03-08 | Disposition: A | Discharge status: Home / Self Care | Payer: BC Managed Care – PPO | Source: Ambulatory Visit | Attending: General Surgery | Admitting: General Surgery

## 2015-03-08 DIAGNOSIS — N63 Unspecified lump in unspecified breast: Secondary | ICD-10-CM

## 2015-03-08 DIAGNOSIS — R9409 Abnormal results of other function studies of central nervous system: Principal | ICD-10-CM

## 2015-03-08 DIAGNOSIS — R928 Other abnormal and inconclusive findings on diagnostic imaging of breast: Secondary | ICD-10-CM

## 2015-03-08 DIAGNOSIS — D241 Benign neoplasm of right breast: Secondary | ICD-10-CM | POA: Insufficient documentation

## 2015-03-08 DIAGNOSIS — R9402 Abnormal brain scan: Secondary | ICD-10-CM

## 2015-03-11 ENCOUNTER — Other Ambulatory Visit: Payer: BC Managed Care – PPO

## 2015-03-11 ENCOUNTER — Encounter: Payer: Self-pay | Admitting: *Deleted

## 2015-03-11 NOTE — Patient Instructions (Signed)
  Your procedure is scheduled on: 03-15-16 Report to [place] @ 9 AM.   Remember: Instructions that are not followed completely may result in serious medical risk, up to and including death, or upon the discretion of your surgeon and anesthesiologist your surgery may need to be rescheduled.    __X__ 1. Do not eat food or drink liquids after midnight. No gum chewing or hard candies.     __X__ 2. No Alcohol for 24 hours before or after surgery.   ____ 3. Bring all medications with you on the day of surgery if instructed.    ____ 4. Notify your doctor if there is any change in your medical condition     (cold, fever, infections).     Do not wear jewelry, make-up, hairpins, clips or nail polish.  Do not wear lotions, powders, or perfumes. You may wear deodorant.  Do not shave 48 hours prior to surgery. Men may shave face and neck.  Do not bring valuables to the hospital.    [facility] is not responsible for any belongings or valuables.               Contacts, dentures or bridgework may not be worn into surgery.  Leave your suitcase in the car. After surgery it may be brought to your room.  For patients admitted to the hospital, discharge time is determined by your  treatment team.   Patients discharged the day of surgery will not be allowed to drive home.   Please read over the following fact sheets that you were given:     ____ Take these medicines the morning of surgery with A SIP OF WATER:    1. NONE  2.   3.   4.  5.  6.  ____ Fleet Enema (as directed)   ____ Use CHG Soap as directed  ____ Use inhalers on the day of surgery  ____ Stop metformin 2 days prior to surgery    ____ Take 1/2 of usual insulin dose the night before surgery and none on the morning of surgery.   ____ Stop Coumadin/Plavix/aspirin-N/A  ____ Stop Anti-inflammatories-NO NSAIDS OR ASA PRODUCTS-TYLENOL OK   ____ Stop supplements until after surgery.    ____ Bring C-Pap to the  hospital.

## 2015-03-13 ENCOUNTER — Inpatient Hospital Stay: Admission: RE | Admit: 2015-03-13 | Payer: BC Managed Care – PPO | Source: Ambulatory Visit

## 2015-03-15 ENCOUNTER — Telehealth: Payer: Self-pay | Admitting: General Surgery

## 2015-03-15 NOTE — Telephone Encounter (Signed)
PT CALLED TODAY AND HAD A RT MAMMO ON 03-08-15 & THEY RECOMMENDED A biopsy right breast mass demonstrating intraductal papilloma. Surgical excision is recommended.SHE WOULD LIKE TO KNOW IF THIS CAN BE DONE TOMORROW WITH THE SURGERY ALREADY SCH'D?

## 2015-03-15 NOTE — Telephone Encounter (Signed)
The radiologist had recommended excision of the benign papilloma previously identified in the right breast. There is no clinical or medical indication for this except relief of patient anxiety. We reviewed the pros and cons of reexcision.  The patient reports the MRI was declined by her insurance carrier. I've yet to receive notification of the rationale.  We plan to proceed to biopsy the 2 areas in the left breast which recently cause concern to [facility].

## 2015-03-16 ENCOUNTER — Ambulatory Visit: Payer: BC Managed Care – PPO

## 2015-03-16 ENCOUNTER — Encounter: Payer: Self-pay | Admitting: *Deleted

## 2015-03-16 ENCOUNTER — Ambulatory Visit
Admission: RE | Admit: 2015-03-16 | Discharge: 2015-03-16 | Disposition: A | Discharge status: Home / Self Care | Payer: BC Managed Care – PPO | Source: Ambulatory Visit | Attending: General Surgery | Admitting: General Surgery

## 2015-03-16 ENCOUNTER — Ambulatory Visit: Payer: BC Managed Care – PPO | Admitting: Anesthesiology

## 2015-03-16 ENCOUNTER — Encounter
Admission: RE | Disposition: A | Discharge status: Home / Self Care | Payer: Self-pay | Source: Ambulatory Visit | Attending: General Surgery

## 2015-03-16 DIAGNOSIS — Z9889 Other specified postprocedural states: Secondary | ICD-10-CM | POA: Insufficient documentation

## 2015-03-16 DIAGNOSIS — N62 Hypertrophy of breast: Secondary | ICD-10-CM | POA: Insufficient documentation

## 2015-03-16 DIAGNOSIS — Z803 Family history of malignant neoplasm of breast: Secondary | ICD-10-CM | POA: Insufficient documentation

## 2015-03-16 DIAGNOSIS — N6042 Mammary duct ectasia of left breast: Secondary | ICD-10-CM | POA: Diagnosis not present

## 2015-03-16 DIAGNOSIS — R928 Other abnormal and inconclusive findings on diagnostic imaging of breast: Secondary | ICD-10-CM | POA: Diagnosis present

## 2015-03-16 DIAGNOSIS — D242 Benign neoplasm of left breast: Secondary | ICD-10-CM | POA: Diagnosis not present

## 2015-03-16 DIAGNOSIS — N6022 Fibroadenosis of left breast: Secondary | ICD-10-CM | POA: Diagnosis not present

## 2015-03-16 HISTORY — PX: BREAST BIOPSY: SHX20

## 2015-03-16 LAB — POCT PREGNANCY, URINE: Preg Test, Ur: NEGATIVE

## 2015-03-16 SURGERY — BREAST BIOPSY WITH NEEDLE LOCALIZATION
Anesthesia: General | Laterality: Left | Wound class: Clean

## 2015-03-16 MED ORDER — FENTANYL CITRATE (PF) 100 MCG/2ML IJ SOLN
INTRAMUSCULAR | Status: AC
  Administered 2015-03-16: 25 ug via INTRAVENOUS
  Filled 2015-03-16: qty 2

## 2015-03-16 MED ORDER — FAMOTIDINE 20 MG PO TABS
ORAL_TABLET | ORAL | Status: AC
  Administered 2015-03-16: 20 mg via ORAL
  Filled 2015-03-16: qty 1

## 2015-03-16 MED ORDER — MIDAZOLAM HCL 5 MG/5ML IJ SOLN
INTRAMUSCULAR | Status: DC | PRN
  Administered 2015-03-16: 2 mg via INTRAVENOUS

## 2015-03-16 MED ORDER — KETOROLAC TROMETHAMINE 30 MG/ML IJ SOLN
INTRAMUSCULAR | Status: DC | PRN
  Administered 2015-03-16: 30 mg via INTRAVENOUS

## 2015-03-16 MED ORDER — ONDANSETRON HCL 4 MG/2ML IJ SOLN
INTRAMUSCULAR | Status: DC | PRN
  Administered 2015-03-16: 4 mg via INTRAVENOUS

## 2015-03-16 MED ORDER — BUPIVACAINE-EPINEPHRINE (PF) 0.5% -1:200000 IJ SOLN
INTRAMUSCULAR | Status: AC
  Filled 2015-03-16: qty 30

## 2015-03-16 MED ORDER — PROPOFOL 10 MG/ML IV BOLUS
INTRAVENOUS | Status: DC | PRN
  Administered 2015-03-16: 150 mg via INTRAVENOUS

## 2015-03-16 MED ORDER — BUPIVACAINE-EPINEPHRINE (PF) 0.5% -1:200000 IJ SOLN
INTRAMUSCULAR | Status: DC | PRN
  Administered 2015-03-16: 30 mL

## 2015-03-16 MED ORDER — OXYCODONE HCL 5 MG PO TABS: 5.0000 mg | ORAL_TABLET | Freq: Once | ORAL | Status: DC | PRN

## 2015-03-16 MED ORDER — FENTANYL CITRATE (PF) 100 MCG/2ML IJ SOLN
INTRAMUSCULAR | Status: DC | PRN
  Administered 2015-03-16 (×2): 50 ug via INTRAVENOUS

## 2015-03-16 MED ORDER — ACETAMINOPHEN 10 MG/ML IV SOLN
INTRAVENOUS | Status: DC | PRN
  Administered 2015-03-16: 1000 mg via INTRAVENOUS

## 2015-03-16 MED ORDER — EPHEDRINE SULFATE 50 MG/ML IJ SOLN: INTRAMUSCULAR | Status: DC | PRN

## 2015-03-16 MED ORDER — EPHEDRINE SULFATE 50 MG/ML IJ SOLN
INTRAMUSCULAR | Status: DC | PRN
  Administered 2015-03-16: 10 mg via INTRAVENOUS

## 2015-03-16 MED ORDER — DEXAMETHASONE SODIUM PHOSPHATE 10 MG/ML IJ SOLN
INTRAMUSCULAR | Status: DC | PRN
  Administered 2015-03-16: 10 mg via INTRAVENOUS

## 2015-03-16 MED ORDER — HYDROCODONE-ACETAMINOPHEN 5-325 MG PO TABS: 1.0000 | ORAL_TABLET | ORAL | Status: DC | PRN

## 2015-03-16 MED ORDER — LIDOCAINE HCL (CARDIAC) 20 MG/ML IV SOLN
INTRAVENOUS | Status: DC | PRN
  Administered 2015-03-16: 80 mg via INTRAVENOUS

## 2015-03-16 MED ORDER — LACTATED RINGERS IV SOLN
INTRAVENOUS | Status: DC
  Administered 2015-03-16: 12:00:00 via INTRAVENOUS

## 2015-03-16 MED ORDER — FAMOTIDINE 20 MG PO TABS
20.0000 mg | ORAL_TABLET | Freq: Once | ORAL | Status: AC
  Administered 2015-03-16: 20 mg via ORAL

## 2015-03-16 MED ORDER — FENTANYL CITRATE (PF) 100 MCG/2ML IJ SOLN
25.0000 ug | INTRAMUSCULAR | Status: DC | PRN
  Administered 2015-03-16 (×2): 25 ug via INTRAVENOUS

## 2015-03-16 MED ORDER — ACETAMINOPHEN 10 MG/ML IV SOLN
INTRAVENOUS | Status: AC
  Filled 2015-03-16: qty 100

## 2015-03-16 MED ORDER — OXYCODONE HCL 5 MG/5ML PO SOLN: 5.0000 mg | Freq: Once | ORAL | Status: DC | PRN

## 2015-03-16 SURGICAL SUPPLY — 38 items
BANDAGE ELASTIC 6 CLIP NS LF (GAUZE/BANDAGES/DRESSINGS) ×3 IMPLANT
BENZOIN TINCTURE PRP APPL 2/3 (GAUZE/BANDAGES/DRESSINGS) ×3 IMPLANT
BLADE SURG 15 STRL SS SAFETY (BLADE) ×6 IMPLANT
BNDG GAUZE 4.5X4.1 6PLY STRL (MISCELLANEOUS) ×3 IMPLANT
CANISTER SUCT 1200ML W/VALVE (MISCELLANEOUS) ×3 IMPLANT
CHLORAPREP W/TINT 26ML (MISCELLANEOUS) ×3 IMPLANT
CLOSURE WOUND 1/2 X4 (GAUZE/BANDAGES/DRESSINGS) ×1
CNTNR SPEC 2.5X3XGRAD LEK (MISCELLANEOUS)
CONT SPEC 4OZ STER OR WHT (MISCELLANEOUS)
CONTAINER SPEC 2.5X3XGRAD LEK (MISCELLANEOUS) IMPLANT
COVER PROBE FLX POLY STRL (MISCELLANEOUS) ×3 IMPLANT
DEVICE DUBIN SPECIMEN MAMMOGRA (MISCELLANEOUS) ×3 IMPLANT
DRAPE LAPAROTOMY 100X77 ABD (DRAPES) ×3 IMPLANT
DRESSING TELFA 4X3 1S ST N-ADH (GAUZE/BANDAGES/DRESSINGS) ×3 IMPLANT
ELECT CAUTERY BLADE TIP 2.5 (TIP) ×3
ELECTRODE CAUTERY BLDE TIP 2.5 (TIP) ×1 IMPLANT
GAUZE FLUFF 18X24 1PLY STRL (GAUZE/BANDAGES/DRESSINGS) ×3 IMPLANT
GLOVE BIO SURGEON STRL SZ7.5 (GLOVE) ×12 IMPLANT
GLOVE INDICATOR 8.0 STRL GRN (GLOVE) ×12 IMPLANT
GOWN STRL REUS W/ TWL LRG LVL3 (GOWN DISPOSABLE) ×4 IMPLANT
GOWN STRL REUS W/TWL LRG LVL3 (GOWN DISPOSABLE) ×8
KIT RM TURNOVER STRD PROC AR (KITS) ×3 IMPLANT
LABEL OR SOLS (LABEL) IMPLANT
MARGIN MAP 10MM (MISCELLANEOUS) ×3 IMPLANT
NDL SAFETY 22GX1.5 (NEEDLE) ×3 IMPLANT
NEEDLE HYPO 25X1 1.5 SAFETY (NEEDLE) ×3 IMPLANT
PACK BASIN MINOR ARMC (MISCELLANEOUS) ×3 IMPLANT
PAD GROUND ADULT SPLIT (MISCELLANEOUS) ×3 IMPLANT
STRIP CLOSURE SKIN 1/2X4 (GAUZE/BANDAGES/DRESSINGS) ×2 IMPLANT
SUT ETHILON 3-0 FS-10 30 BLK (SUTURE) ×3
SUT VIC AB 2-0 CT1 27 (SUTURE) ×2
SUT VIC AB 2-0 CT1 TAPERPNT 27 (SUTURE) ×1 IMPLANT
SUT VIC AB 4-0 FS2 27 (SUTURE) ×3 IMPLANT
SUTURE EHLN 3-0 FS-10 30 BLK (SUTURE) ×1 IMPLANT
SWABSTK COMLB BENZOIN TINCTURE (MISCELLANEOUS) ×3 IMPLANT
SYR CONTROL 10ML (SYRINGE) ×3 IMPLANT
TAPE TRANSPORE STRL 2 31045 (GAUZE/BANDAGES/DRESSINGS) ×3 IMPLANT
WATER STERILE IRR 1000ML POUR (IV SOLUTION) ×3 IMPLANT

## 2015-03-16 NOTE — Anesthesia Procedure Notes (Signed)
Procedure Name: LMA Insertion Date/Time: 03/16/2015 12:01 PM Performed by: Delaney Meigs Pre-anesthesia Checklist: Patient identified, Emergency Drugs available, Suction available, Patient being monitored and Timeout performed Patient Re-evaluated:Patient Re-evaluated prior to inductionOxygen Delivery Method: Circle system utilized and Simple face mask Preoxygenation: Pre-oxygenation with 100% oxygen Intubation Type: IV induction Ventilation: Mask ventilation without difficulty LMA Size: 3.5 Number of attempts: 1 Placement Confirmation: breath sounds checked- equal and bilateral and positive ETCO2 Tube secured with: Tape

## 2015-03-16 NOTE — H&P (Signed)
Patient for planned biopsy of 2 lesions of the left breast. Needle localization completed earlier today by radiology.  No change in clinical history.  No indication for rebiopsy of the retroareolar tissue on the left or formal excision of the benign papilloma the right breast.

## 2015-03-16 NOTE — Transfer of Care (Signed)
Immediate Anesthesia Transfer of Care Note  Patient: Denise [name]  Procedure(s) Performed: Procedure(s): BREAST BIOPSY WITH NEEDLE LOCALIZATION (Left)  Patient Location: PACU  Anesthesia Type:General  Level of Consciousness: sedated  Airway & Oxygen Therapy: Patient Spontanous Breathing and Patient connected to face mask oxygen  Post-op Assessment: Report given to RN and Post -op Vital signs reviewed and stable  Post vital signs: Reviewed and stable  Last Vitals: 102/53 bp Filed Vitals:   03/16/15 1312  BP:   Pulse: 75  Temp: 37 C  Resp: 14    Complications: No apparent anesthesia complications

## 2015-03-16 NOTE — Anesthesia Preprocedure Evaluation (Signed)
Anesthesia Evaluation  Patient identified by MRN, date of birth, ID band Patient awake    Reviewed: Allergy & Precautions, H&P , NPO status , Patient's Chart, lab work & pertinent test results  History of Anesthesia Complications Negative for: history of anesthetic complications  Airway Mallampati: II  TM Distance: >3 FB Neck ROM: full    Dental no notable dental hx. (+) Teeth Intact   Pulmonary neg pulmonary ROS, neg shortness of breath,    Pulmonary exam normal breath sounds clear to auscultation       Cardiovascular Exercise Tolerance: Good (-) Past MI negative cardio ROS Normal cardiovascular exam Rhythm:regular Rate:Normal     Neuro/Psych negative neurological ROS  negative psych ROS   GI/Hepatic negative GI ROS, Neg liver ROS, neg GERD  ,  Endo/Other  negative endocrine ROS  Renal/GU negative Renal ROS  negative genitourinary   Musculoskeletal   Abdominal   Peds  Hematology negative hematology ROS (+)   Anesthesia Other Findings History of breast cancer.   Reproductive/Obstetrics negative OB ROS                             Anesthesia Physical Anesthesia Plan  ASA: III  Anesthesia Plan: General LMA   Post-op Pain Management:    Induction:   Airway Management Planned:   Additional Equipment:   Intra-op Plan:   Post-operative Plan:   Informed Consent: I have reviewed the patients History and Physical, chart, labs and discussed the procedure including the risks, benefits and alternatives for the proposed anesthesia with the patient or authorized representative who has indicated his/her understanding and acceptance.   Dental Advisory Given  Plan Discussed with: Anesthesiologist, CRNA and Surgeon  Anesthesia Plan Comments:         Anesthesia Quick Evaluation

## 2015-03-16 NOTE — Op Note (Signed)
Preoperative diagnosis: Abnormal mammogram.  Postoperative diagnosis: Same.  Operative procedure: Wire localization of 2 lesions in the lower outer quadrant of the left breast.  Operative surgeon: Hervey Ard, M.D.  Anesthesia: Gen. by LMA, Marcaine 0.5% with 1-200,000 epinephrine, 30 mL local infiltration  Estimated blood loss: 10 mL  Clinical note: This 39 year old man has had a new change in her mammogram. The radiologist has raised a question about a residual biopsy clip status post excision of a fibroadenoma earlier this year. It was elected to have the patient have the previous clip as well as the new mammographic abnormality localized for excision.  Operative note: With the patient under adequate general anesthesia the breast was prepped with core prep and draped. Marcaine was infiltrated for postoperative analgesia. A radial incision from the 5:00 position at the edge of the areola to above the inframammary fold was made and carried down through skin and subcutaneous venous tissue encompassing both wires. The skin was incised sharply and the remaining dissection completed with electrocautery. A block of tissue 2 x 4 x 6 cm was removed, orientated and specimen radiograph confirmed the previously placed clip as well as the entirety of both wires.  The breast tissue was elevated off the underlying pectoralis fascia for approximation of the deep level with interrupted 2-0 Vicryls sutures. The adipose tissue was approximated similar fashion. The inferior skin was mobilized approximately 3 cm to allow easy and tension-free approximation and maintain the inframammary fold. The skin was approximated with a running 4-0 Vicryls septic suture. Benzoin, Steri-Strips, Telfa dressing applied. Fluff gauze, Kerlix and a strap place.  The patient tolerated the procedure well was brought to recovery in stable condition.

## 2015-03-16 NOTE — Anesthesia Postprocedure Evaluation (Signed)
  Anesthesia Post-op Note  Patient: Denise [name]  Procedure(s) Performed: Procedure(s): BREAST BIOPSY WITH NEEDLE LOCALIZATION (Left)  Anesthesia type:General LMA  Patient location: PACU  Post pain: Pain level controlled  Post assessment: Post-op Vital signs reviewed, Patient's Cardiovascular Status Stable, Respiratory Function Stable, Patent Airway and No signs of Nausea or vomiting  Post vital signs: Reviewed and stable  Last Vitals:  Filed Vitals:   03/16/15 1401  BP: 130/72  Pulse: 100  Temp: 36.4 C  Resp: 16    Level of consciousness: awake, alert  and patient cooperative  Complications: No apparent anesthesia complications

## 2015-03-17 ENCOUNTER — Ambulatory Visit
Admission: RE | Admit: 2015-03-17 | Discharge: 2015-03-17 | Disposition: A | Discharge status: Home / Self Care | Payer: BC Managed Care – PPO | Source: Ambulatory Visit | Attending: General Surgery | Admitting: General Surgery

## 2015-03-17 ENCOUNTER — Other Ambulatory Visit: Payer: Self-pay | Admitting: General Surgery

## 2015-03-17 ENCOUNTER — Telehealth: Payer: Self-pay | Admitting: General Surgery

## 2015-03-17 DIAGNOSIS — R928 Other abnormal and inconclusive findings on diagnostic imaging of breast: Secondary | ICD-10-CM

## 2015-03-17 DIAGNOSIS — N63 Unspecified lump in breast: Secondary | ICD-10-CM | POA: Diagnosis present

## 2015-03-17 LAB — SURGICAL PATHOLOGY

## 2015-03-17 NOTE — Telephone Encounter (Signed)
Notified path ok.  F/U as scheduled.

## 2015-03-23 ENCOUNTER — Ambulatory Visit (INDEPENDENT_AMBULATORY_CARE_PROVIDER_SITE_OTHER): Payer: BC Managed Care – PPO | Admitting: General Surgery

## 2015-03-23 ENCOUNTER — Encounter: Payer: Self-pay | Admitting: General Surgery

## 2015-03-23 VITALS — BP 116/70 | HR 78 | Resp 12 | Ht 67.0 in | Wt 168.0 lb

## 2015-03-23 DIAGNOSIS — N63 Unspecified lump in breast: Secondary | ICD-10-CM

## 2015-03-23 DIAGNOSIS — N6320 Unspecified lump in the left breast, unspecified quadrant: Secondary | ICD-10-CM

## 2015-03-23 DIAGNOSIS — D242 Benign neoplasm of left breast: Secondary | ICD-10-CM

## 2015-03-23 NOTE — Patient Instructions (Signed)
Patient to have plan on  having a MRI in December 2016.

## 2015-03-23 NOTE — Progress Notes (Signed)
Patient ID: Denise [name], female   DOB: [birth year], 39 y.o.   MRN: [id number]  Chief Complaint  Patient presents with  . Follow-up    left breast biopsy    HPI IVYANNA [name] is a 39 y.o. female. here today for her follow up left breast biopsy done on 03/16/15. Patient states she is doing well. HPI  No past medical history on file.  Past Surgical History  Procedure Laterality Date  . Breast surgery Right March 2000    benign fibroadenoma  . Breast surgery Left 08/08/13    excision  . Breast surgery Left 09/23/14    excision  . Breast biopsy Bilateral 09/08/14    neg  . Breast excisional biopsy Left 09/2014  . Breast biopsy Left 03/16/2015    Procedure: BREAST BIOPSY WITH NEEDLE LOCALIZATION;  Surgeon: [name], MD;  Location: [facility] ORS;  Service: General;  Laterality: Left;    Family History  Problem Relation Age of Onset  . Cancer Paternal Grandmother     breast  . Breast cancer Paternal Grandmother   . Cancer Maternal Aunt     breast  . Breast cancer Maternal Aunt   . Breast cancer Paternal Aunt 32    Social History Social History  Substance Use Topics  . Smoking status: Never Smoker   . Smokeless tobacco: Never Used  . Alcohol Use: Yes     Comment: occasionally    No Known Allergies  Current Outpatient Prescriptions  Medication Sig Dispense Refill  . HYDROcodone-acetaminophen (NORCO) 5-325 MG tablet Take 1-2 tablets by mouth every 4 (four) hours as needed. 30 tablet 0  . TRI-SPRINTEC 0.18/0.215/0.25 MG-35 MCG tablet Take 1 tablet by mouth daily.     No current facility-administered medications for this visit.    Review of Systems Review of Systems  Constitutional: Negative.   Respiratory: Negative.   Cardiovascular: Negative.     Blood pressure 116/70, pulse 78, resp. rate 12, height 5\' 7"  (1.702 m), weight 168 lb (76.204 kg), last menstrual period 02/28/2015.  Physical Exam Physical Exam  Constitutional: She is oriented to person, place,  and time. She appears well-developed and well-nourished.  Pulmonary/Chest:    Left breast incision site looks clean and healing well  Neurological: She is alert and oriented to person, place, and time.  Skin: Skin is warm and dry.    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    Plan    Plans were prebiopsy MRI were throated by an inability of the insurance company to appreciate her high risk status based on multiple previous biopsies rather than to address the lesions already identified on mammography. We'll attempt to arrange for an MRI in December, 3 months out from her recent surgery, hopefully far enough out to minimize any false positive studies.  Options for control of the plethora of benign lesion she is developed over the last several years were reviewed. This would include the use of antiestrogen therapy such as tamoxifen, but would necessitate some form of sterilization to illuminate the risk of fetal exposure. This would also put her at risk for significant vasomotor symptoms, but not osteoporosis based on the bone spurring effects of tamoxifen.      Patient to have plan on  Having a MRI in December 2016.   PCP:  Deliah Boston 03/23/2015, 8:20 PM

## 2015-03-24 ENCOUNTER — Other Ambulatory Visit: Payer: Self-pay | Admitting: *Deleted

## 2015-03-24 DIAGNOSIS — D242 Benign neoplasm of left breast: Secondary | ICD-10-CM

## 2015-03-24 DIAGNOSIS — N6320 Unspecified lump in the left breast, unspecified quadrant: Secondary | ICD-10-CM

## 2015-03-24 NOTE — Progress Notes (Unsigned)
Message left for patient to call the office.   Patient needs to call the [facility] to arrange breast MRI for early December. Their number is [phone]. She will need to notify our office once date is scheduled so we can work on getting approval since this was denied in the past.

## 2015-04-26 ENCOUNTER — Ambulatory Visit (INDEPENDENT_AMBULATORY_CARE_PROVIDER_SITE_OTHER): Payer: BC Managed Care – PPO

## 2015-04-26 ENCOUNTER — Ambulatory Visit (INDEPENDENT_AMBULATORY_CARE_PROVIDER_SITE_OTHER): Payer: BC Managed Care – PPO | Admitting: Podiatry

## 2015-04-26 ENCOUNTER — Encounter: Payer: Self-pay | Admitting: Podiatry

## 2015-04-26 VITALS — BP 116/66 | HR 84 | Resp 16

## 2015-04-26 DIAGNOSIS — M722 Plantar fascial fibromatosis: Secondary | ICD-10-CM

## 2015-04-26 MED ORDER — METHYLPREDNISOLONE 4 MG PO TBPK: ORAL_TABLET | ORAL | Status: DC

## 2015-04-26 MED ORDER — MELOXICAM 15 MG PO TABS: 15.0000 mg | ORAL_TABLET | Freq: Every day | ORAL | Status: DC

## 2015-04-26 NOTE — Patient Instructions (Signed)

## 2015-04-26 NOTE — Progress Notes (Signed)
   Subjective:    Patient ID: Denise [name], female    DOB: May 15, 1976, 39 y.o.   MRN: [id number]  HPI: He presents today with a 2 year duration of heel pain that initially was intermittent which has become consistent in its severity over the past several months. She states the mornings are particularly bad and after she's been sitting for a while and gets up to really ambulate. She has done nothing to treat it.    Review of Systems  Respiratory: Positive for cough and wheezing.   All other systems reviewed and are negative.      Objective:   Physical Exam: 39 year old female presents today with a chief complaint heel pain in no apparent distress. Vital signs stable alert and oriented 3. Pulses are strongly palpable. Neurologic sensorium is intact per Semmes-Weinstein monofilament. Deep tendon reflexes are intact. Muscle strength is 5 over 5 dorsiflexes plantar flexors and inverters everters all intrinsic musculature is intact. Orthopedic evaluation of his drapes all joints distal to the ankle for range of motion without crepitation. She has pain on palpation of the medial calcaneal tubercles bilateral. Radiographs do demonstrates small plantar distally oriented calcaneal heel spurs and soft tissue increase in density at the plantar fascial calcaneal insertion sites bilaterally. Cutaneous evaluation does demonstrates supple well-hydrated cutis no open lesions or wounds.          Assessment & Plan:  Plantar fasciitis bilateral right greater than left.  Plan: Started her on a Medrol Dosepak to be followed by meloxicam. Discussed appropriate shoe gear stretching exercises shoe gear therapy. We also injected the bilateral heels today with Kenalog and local anesthetic. Placed her in bilateral plantar fascial braces and a night splint. We'll follow up with her in 1 month may need to consider orthotics at that time.

## 2015-05-20 ENCOUNTER — Telehealth: Payer: Self-pay | Admitting: *Deleted

## 2015-05-20 NOTE — Telephone Encounter (Addendum)
Pt states has rash on her thigh, that was very red and itchy, didn't take Meloxicam today and the rash is much better.  I told pt to stop the Meloxicam and I would inform Dr. Milinda Pointer and ask for further instructions.  Dr. Stephenie Acres orders to pt and [facility].

## 2015-05-24 MED ORDER — DICLOFENAC SODIUM 75 MG PO TBEC: 75.0000 mg | DELAYED_RELEASE_TABLET | Freq: Two times a day (BID) | ORAL | Status: DC

## 2015-05-24 NOTE — Telephone Encounter (Signed)
Start her on Diclofenac 75mg  #60 one twice daily.

## 2015-05-31 ENCOUNTER — Ambulatory Visit
Admission: RE | Admit: 2015-05-31 | Discharge: 2015-05-31 | Disposition: A | Discharge status: Home / Self Care | Payer: BC Managed Care – PPO | Source: Ambulatory Visit | Attending: General Surgery | Admitting: General Surgery

## 2015-05-31 DIAGNOSIS — N6320 Unspecified lump in the left breast, unspecified quadrant: Secondary | ICD-10-CM

## 2015-05-31 DIAGNOSIS — D242 Benign neoplasm of left breast: Secondary | ICD-10-CM

## 2015-05-31 MED ORDER — GADOBENATE DIMEGLUMINE 529 MG/ML IV SOLN
15.0000 mL | Freq: Once | INTRAVENOUS | Status: AC | PRN
  Administered 2015-05-31: 15 mL via INTRAVENOUS

## 2015-06-01 ENCOUNTER — Other Ambulatory Visit: Payer: BC Managed Care – PPO

## 2015-06-01 ENCOUNTER — Telehealth: Payer: Self-pay | Admitting: General Surgery

## 2015-06-02 ENCOUNTER — Encounter: Payer: Self-pay | Admitting: Podiatry

## 2015-06-02 ENCOUNTER — Ambulatory Visit (INDEPENDENT_AMBULATORY_CARE_PROVIDER_SITE_OTHER): Payer: BC Managed Care – PPO | Admitting: Podiatry

## 2015-06-02 DIAGNOSIS — M722 Plantar fascial fibromatosis: Secondary | ICD-10-CM

## 2015-06-02 NOTE — Progress Notes (Signed)
She presents today for follow-up of her plantar fasciitis. She states that she is approximately 90% improved however the right heel is somewhat tender. She would like to consider a pair of orthotics.  Objective: Vital signs are stable she is alert and oriented 3. Pulses are strongly palpable. She has pain on palpation medial calcaneal tubercle of the right heel. No pain on palpation of the left heel.  Assessment: Resolving plantar fasciitis left foot. Slowly progressing plantar fasciitis right foot.  Plan: She was scanned for set of orthotics today after an injection to her right heel with Kenalog and local anesthetic. She will continue the use of plantar fascial braces and the night splint. She will also continue the use of appropriate shoe gear as well as her anti-inflammatories. Should she have questions or concerns she will notify us immediately otherwise I'll follow up with her typical for orthotics in the near future.

## 2015-06-02 NOTE — Telephone Encounter (Signed)
Patient returned your call, stated she would be available to speak with you today on her cell phone when you get a chance to call her back.

## 2015-06-03 NOTE — Telephone Encounter (Signed)
06-03-15 PT CALLED TO SEE IF YOU HAD RECEIVED HER MESSAGE. I EXPLAINED YOU WHERE IN CLINIC 06-02-15 & IN THE OR 06-03-15.IF SHE HAD NOT HEARD BACK FROM YOU ON 06-04-15 I WOULD CK WITH YOU DIRECTLY./MTH

## 2015-06-04 NOTE — Telephone Encounter (Signed)
Pt called again this morning. I just wanted to ck with you to make sure you received her message.Thank you Melanie.

## 2015-06-07 ENCOUNTER — Other Ambulatory Visit: Payer: Self-pay | Admitting: *Deleted

## 2015-06-07 ENCOUNTER — Other Ambulatory Visit: Payer: Self-pay | Admitting: General Surgery

## 2015-06-07 ENCOUNTER — Ambulatory Visit: Payer: BC Managed Care – PPO | Attending: General Surgery

## 2015-06-07 DIAGNOSIS — N6320 Unspecified lump in the left breast, unspecified quadrant: Secondary | ICD-10-CM

## 2015-06-15 ENCOUNTER — Other Ambulatory Visit: Payer: Self-pay | Admitting: General Surgery

## 2015-06-15 ENCOUNTER — Ambulatory Visit
Admission: RE | Admit: 2015-06-15 | Discharge: 2015-06-15 | Disposition: A | Discharge status: Home / Self Care | Payer: BC Managed Care – PPO | Source: Ambulatory Visit | Attending: General Surgery | Admitting: General Surgery

## 2015-06-15 DIAGNOSIS — N6320 Unspecified lump in the left breast, unspecified quadrant: Secondary | ICD-10-CM

## 2015-06-15 DIAGNOSIS — N63 Unspecified lump in unspecified breast: Secondary | ICD-10-CM

## 2015-06-16 LAB — SURGICAL PATHOLOGY

## 2015-06-18 ENCOUNTER — Telehealth: Payer: Self-pay | Admitting: General Surgery

## 2015-06-18 NOTE — Telephone Encounter (Signed)
Notified path benign.   Will need to discuss recommendation for MRI biopsy.

## 2015-06-20 DIAGNOSIS — Z9189 Other specified personal risk factors, not elsewhere classified: Secondary | ICD-10-CM

## 2015-06-20 DIAGNOSIS — Z1501 Genetic susceptibility to malignant neoplasm of breast: Secondary | ICD-10-CM

## 2015-06-20 DIAGNOSIS — Z1509 Genetic susceptibility to other malignant neoplasm: Secondary | ICD-10-CM

## 2015-06-20 DIAGNOSIS — Z1502 Genetic susceptibility to malignant neoplasm of ovary: Secondary | ICD-10-CM

## 2015-06-20 DIAGNOSIS — Z1589 Genetic susceptibility to other disease: Secondary | ICD-10-CM

## 2015-06-20 HISTORY — DX: Other specified personal risk factors, not elsewhere classified: Z91.89

## 2015-06-20 HISTORY — DX: Genetic susceptibility to malignant neoplasm of breast: Z15.01

## 2015-06-23 ENCOUNTER — Telehealth: Payer: Self-pay | Admitting: General Surgery

## 2015-06-23 NOTE — Telephone Encounter (Signed)
Patient was contacted to confirm she had had results from her recently completed left breast biopsy which showed a benign fibroadenoma.  We'll long discussion regarding the pros and cons of an MRI biopsy for the area in the upper inner quadrant of the left breast that is not visualized on either mammography or ultrasound.  At this time, and a new calendar year, with a $1250 deductible she has decided to postpone an intervention at this time.  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

## 2015-07-16 ENCOUNTER — Ambulatory Visit (INDEPENDENT_AMBULATORY_CARE_PROVIDER_SITE_OTHER): Payer: BC Managed Care – PPO | Admitting: *Deleted

## 2015-07-16 DIAGNOSIS — M722 Plantar fascial fibromatosis: Secondary | ICD-10-CM

## 2015-07-16 NOTE — Patient Instructions (Signed)

## 2015-07-16 NOTE — Progress Notes (Signed)
Patient presents today to pick up orthotics. Instructions were reviewed and a copy was given. Return for follow up in 6 weeks with Dr. Milinda Pointer.

## 2015-09-01 ENCOUNTER — Ambulatory Visit (INDEPENDENT_AMBULATORY_CARE_PROVIDER_SITE_OTHER): Payer: BC Managed Care – PPO | Admitting: Podiatry

## 2015-09-01 ENCOUNTER — Encounter: Payer: Self-pay | Admitting: Podiatry

## 2015-09-01 VITALS — BP 116/79 | HR 99 | Resp 16

## 2015-09-01 DIAGNOSIS — M722 Plantar fascial fibromatosis: Secondary | ICD-10-CM | POA: Diagnosis not present

## 2015-09-02 NOTE — Progress Notes (Signed)
She presents today for follow-up of her plantar fasciitis and her orthotics. She states that she is doing much better with her heels bilaterally. She states that only after she's been on them for a long period time to the refill little bit bruised but even that is getting better. She states that her cup each unit well of her orthotics but is still seems to do the job.  Objective: Vital signs are stable alert and oriented 3. Pulses are palpable. No pain on palpation medial calcaneal tubercles bilateral heels orthotics appear to fit well though the distal aspect of the right orthotic is somewhat chewed up.  Assessment: Well-healing plantar fasciitis.  Plan: Continue the use of the orthotics. We will see about getting her a different set of orthotics in the near future.

## 2015-09-16 ENCOUNTER — Other Ambulatory Visit: Payer: Self-pay | Admitting: *Deleted

## 2015-09-16 DIAGNOSIS — D242 Benign neoplasm of left breast: Secondary | ICD-10-CM

## 2015-09-21 ENCOUNTER — Encounter: Payer: Self-pay | Admitting: *Deleted

## 2015-11-22 ENCOUNTER — Ambulatory Visit
Admission: RE | Admit: 2015-11-22 | Discharge: 2015-11-22 | Disposition: A | Discharge status: Home / Self Care | Payer: BC Managed Care – PPO | Source: Ambulatory Visit | Attending: General Surgery | Admitting: General Surgery

## 2015-11-22 ENCOUNTER — Other Ambulatory Visit: Payer: Self-pay | Admitting: General Surgery

## 2015-11-22 DIAGNOSIS — D242 Benign neoplasm of left breast: Secondary | ICD-10-CM

## 2015-11-29 ENCOUNTER — Ambulatory Visit (INDEPENDENT_AMBULATORY_CARE_PROVIDER_SITE_OTHER): Payer: BC Managed Care – PPO | Admitting: General Surgery

## 2015-11-29 ENCOUNTER — Encounter: Payer: Self-pay | Admitting: General Surgery

## 2015-11-29 VITALS — BP 118/68 | HR 74 | Resp 12 | Ht 67.0 in | Wt 175.0 lb

## 2015-11-29 DIAGNOSIS — D242 Benign neoplasm of left breast: Secondary | ICD-10-CM | POA: Diagnosis not present

## 2015-11-29 DIAGNOSIS — D249 Benign neoplasm of unspecified breast: Secondary | ICD-10-CM | POA: Insufficient documentation

## 2015-11-29 DIAGNOSIS — D241 Benign neoplasm of right breast: Secondary | ICD-10-CM

## 2015-11-29 NOTE — Progress Notes (Signed)
Patient ID: Denise [name], female   DOB: August 03, 1975, 40 y.o.   MRN: [id number]  Chief Complaint  Patient presents with  . Follow-up    mammogram    HPI Denise [name] is a 40 y.o. female who presents for a breast evaluation. The most recent mammogram was done on 11/22/15.  Patient does perform regular self breast checks and gets regular mammograms done.  The patient reports perimenstrual breast tenderness, but no other clinical changes. The patient was accompanied today by her 37-year-old daughter, Denise [name].  I personally reviewed the patient's history and mammograms.    HPI  No past medical history on file.  Past Surgical History  Procedure Laterality Date  . Breast surgery Right March 2000    benign fibroadenoma  . Breast surgery Left 08/08/13    excision  . Breast surgery Left 09/23/14    excision  . Breast biopsy Bilateral 09/08/14    neg  . Breast excisional biopsy Left 09/2014  . Breast biopsy Left 03/16/2015    Procedure: BREAST BIOPSY WITH NEEDLE LOCALIZATION;  Surgeon: [name], MD;  Location: [facility] ORS;  Service: General;  Laterality: Left;    Family History  Problem Relation Age of Onset  . Cancer Paternal Grandmother     breast  . Breast cancer Paternal Grandmother   . Cancer Maternal Aunt     breast  . Breast cancer Maternal Aunt   . Breast cancer Paternal Aunt 88    Social History Social History  Substance Use Topics  . Smoking status: Never Smoker   . Smokeless tobacco: Never Used  . Alcohol Use: Yes     Comment: occasionally    No Known Allergies  Current Outpatient Prescriptions  Medication Sig Dispense Refill  . TRI-SPRINTEC 0.18/0.215/0.25 MG-35 MCG tablet Take 1 tablet by mouth daily.    . diclofenac (VOLTAREN) 75 MG EC tablet Take 1 tablet (75 mg total) by mouth 2 (two) times daily. (Patient not taking: Reported on 11/29/2015) 60 tablet 0  . meloxicam (MOBIC) 15 MG tablet Take 1 tablet (15 mg total) by mouth daily. (Patient not taking:  Reported on 11/29/2015) 30 tablet 3   No current facility-administered medications for this visit.    Review of Systems Review of Systems  Constitutional: Negative.   Respiratory: Negative.   Cardiovascular: Negative.     Blood pressure 118/68, pulse 74, resp. rate 12, height 5\' 7"  (1.702 m), weight 175 lb (79.379 kg), last menstrual period 11/07/2015.  Physical Exam Physical Exam  Constitutional: She is oriented to person, place, and time. She appears well-developed and well-nourished.  Eyes: Conjunctivae are normal. No scleral icterus.  Neck: Neck supple.  Cardiovascular: Normal rate, regular rhythm and normal heart sounds.   Pulmonary/Chest: Effort normal and breath sounds normal. Right breast exhibits no inverted nipple, no mass, no nipple discharge, no skin change and no tenderness. Left breast exhibits no inverted nipple, no mass, no nipple discharge, no skin change and no tenderness.    Abdominal: Soft. Normal appearance and bowel sounds are normal. There is no hepatomegaly. There is no tenderness. No hernia.  Lymphadenopathy:    She has no cervical adenopathy.    She has no axillary adenopathy.  Neurological: She is alert and oriented to person, place, and time.  Skin: Skin is warm and dry.    Data Reviewed Bilateral diagnostic mammograms with Toma synthesis dated 11/22/2015 were reviewed. 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 Not  supported by current surgical data. Recommend for biopsy of by MRI of a 4 mm indeterminate mass in the upper inner left breast not seen on past mammograms or ultrasound. BIRAD-2.  Previous recommendation for MRI biopsy had been reviewed with the patient and he elected to defer.  Assessment    Stable breast exam. Known right breast papilloma. No mammographic change.    Plan    The patient was concerned that on her review of the mammogram report she had had all she had completed was  a 2-D study. The Toma synthesis views were independently reviewed and this would constitute a 3-D exam.    Patient will be asked to return to the office in one year with a 3D bilateral screening mammogram.  PCP:  Copland, Elmo Putt   This information has been scribed by Gaspar Cola CMA.    [name] 11/29/2015, 8:59 PM

## 2015-11-29 NOTE — Patient Instructions (Signed)
Patient will be asked to return to the office in one year with a bilateral screening mammogram. 

## 2016-09-27 ENCOUNTER — Other Ambulatory Visit: Payer: Self-pay

## 2016-09-27 DIAGNOSIS — D249 Benign neoplasm of unspecified breast: Secondary | ICD-10-CM

## 2016-11-28 ENCOUNTER — Ambulatory Visit
Admission: RE | Admit: 2016-11-28 | Discharge: 2016-11-28 | Disposition: A | Discharge status: Home / Self Care | Payer: BC Managed Care – PPO | Source: Ambulatory Visit | Attending: General Surgery | Admitting: General Surgery

## 2016-11-28 DIAGNOSIS — D249 Benign neoplasm of unspecified breast: Secondary | ICD-10-CM

## 2016-11-28 DIAGNOSIS — D241 Benign neoplasm of right breast: Secondary | ICD-10-CM | POA: Insufficient documentation

## 2016-11-28 DIAGNOSIS — N6320 Unspecified lump in the left breast, unspecified quadrant: Secondary | ICD-10-CM | POA: Insufficient documentation

## 2016-12-05 ENCOUNTER — Ambulatory Visit (INDEPENDENT_AMBULATORY_CARE_PROVIDER_SITE_OTHER): Payer: BC Managed Care – PPO | Admitting: General Surgery

## 2016-12-05 ENCOUNTER — Encounter: Payer: Self-pay | Admitting: General Surgery

## 2016-12-05 ENCOUNTER — Inpatient Hospital Stay: Payer: Self-pay

## 2016-12-05 VITALS — BP 126/86 | HR 68 | Resp 12 | Ht 67.0 in | Wt 174.0 lb

## 2016-12-05 DIAGNOSIS — N6310 Unspecified lump in the right breast, unspecified quadrant: Secondary | ICD-10-CM

## 2016-12-05 NOTE — Patient Instructions (Addendum)
The patient is aware to call back for any questions or concerns. Patient will be asked to return to the office in one year with a bilateral screening mammogram. 

## 2016-12-05 NOTE — Progress Notes (Signed)
Patient ID: Denise [name], female   DOB: 29-Aug-1975, 41 y.o.   MRN: [id number]  Chief Complaint  Patient presents with  . Follow-up    mammogram    HPI Denise [name] is a 41 y.o. female who presents for a breast evaluation. The most recent mammogram was done on 11/28/16 . Occasional shooting pains both breast but most recently in right breast. The pain does not last very long. She is using frankincense oil to her chest once a week. Patient does perform regular self breast checks and gets regular mammograms done. Patient states no new breast changes.    HPI  No past medical history on file.  Past Surgical History:  Procedure Laterality Date  . BREAST BIOPSY Bilateral 09/08/14   neg  . BREAST BIOPSY Left 03/16/2015   Procedure: BREAST BIOPSY WITH NEEDLE LOCALIZATION;  Surgeon: [name], MD;  Location: [facility] ORS;  Service: General;  Laterality: Left;  . BREAST EXCISIONAL BIOPSY Left 09/2014  . BREAST SURGERY Right March 2000   benign fibroadenoma  . BREAST SURGERY Left 08/08/13   excision  . BREAST SURGERY Left 09/23/14   excision    Family History  Problem Relation Age of Onset  . Cancer Paternal Grandmother        breast  . Breast cancer Paternal Grandmother   . Cancer Maternal Aunt        breast  . Breast cancer Maternal Aunt   . Breast cancer Paternal Aunt 53    Social History Social History  Substance Use Topics  . Smoking status: Never Smoker  . Smokeless tobacco: Never Used  . Alcohol use Yes     Comment: occasionally    No Known Allergies  Current Outpatient Prescriptions  Medication Sig Dispense Refill  . TRI-SPRINTEC 0.18/0.215/0.25 MG-35 MCG tablet Take 1 tablet by mouth daily.     No current facility-administered medications for this visit.     Review of Systems Review of Systems  Constitutional: Negative.   Respiratory: Negative.   Cardiovascular: Negative.     Blood pressure 126/86, pulse 68, resp. rate 12, height 5\' 7"  (1.702 m),  weight 174 lb (78.9 kg), last menstrual period 12/03/2016.  Physical Exam Physical Exam  Constitutional: She is oriented to person, place, and time. She appears well-developed and well-nourished.  HENT:  Mouth/Throat: Oropharynx is clear and moist.  Eyes: Conjunctivae are normal. No scleral icterus.  Neck: Neck supple.  Cardiovascular: Normal rate, regular rhythm and normal heart sounds.   Pulmonary/Chest: Effort normal and breath sounds normal. Right breast exhibits no inverted nipple, no mass, no nipple discharge, no skin change and no tenderness. Left breast exhibits no inverted nipple, no mass, no nipple discharge, no skin change and no tenderness.    Thickening 11 o'clock 4 CFN right breast.   Lymphadenopathy:    She has no cervical adenopathy.    She has no axillary adenopathy.  Neurological: She is alert and oriented to person, place, and time.  Skin: Skin is warm and dry.  Psychiatric: Her behavior is normal.    Data Reviewed 11/28/2016 bilateral diagnostic mammograms were reviewed. Postsurgical change. BI-RADS-2.  Recommendation for repeat MRI to assess stability of indeterminate 4 mm left breast mass. Study originally completed in December 2016. No discernible mammographic change in 18 months. This was thought to possibly correlate with a previously described subareolar mass in the 3:00 position dating back to September 2016.  Ultrasound examination of the area of focal thickening in the 11:00  position of the left breast showed rest parenchyma approaching within 0.78 cm of the overall lying skin as opposed to the adjacent tissue where it was more typically 1.25 cm from the skin. This small plateau of breast parenchyma was otherwise unremarkable. BI-RADS-1.  06/15/2015 stereotactic biopsy other F breast at 3:00 position: Fibroadenoma.  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  09/23/2014 left breast biopsy: Focal pseudo-angiomatous stromal hyperplasia and biopsy cavity. No residual fibroadenoma.  09/08/2014 core biopsy S tissue with pseudo-angiomatous stromal hyperplasia and banal spindle cell proliferation:   08/08/2013 biopsies of the left breast showed a fibroadenoma with flat epithelial atypia, atypical columnar cell changes, margins negative. 3:00, 4:00 position: Fibroadenoma. (Based on the patient's very young age it was elected not to institute tamoxifen therapy.)  Assessment    Stable breast exam.  No mammographic interval change.  Previous identified 4 mm breast nodule unchanged over 18 months on serial mammograms.    Plan       Patient will be asked to return to the office in one year with a bilateral screening mammogram.   HPI, Physical Exam, Assessment and Plan have been scribed under the direction and in the presence of [name], MD.  Karie Fetch, RN  I have completed the exam and reviewed the above documentation for accuracy and completeness.  I agree with the above.  Haematologist has been used and any errors in dictation or transcription are unintentional.  Hervey Ard, M.D., F.A.C.S.  [name] 12/05/2016, 8:27 PM

## 2017-06-18 ENCOUNTER — Ambulatory Visit: Payer: BC Managed Care – PPO | Admitting: General Surgery

## 2017-06-18 ENCOUNTER — Inpatient Hospital Stay: Payer: Self-pay

## 2017-06-18 VITALS — BP 144/80 | HR 80 | Resp 14 | Ht 67.0 in | Wt 184.0 lb

## 2017-06-18 DIAGNOSIS — N6002 Solitary cyst of left breast: Secondary | ICD-10-CM

## 2017-06-18 DIAGNOSIS — N6323 Unspecified lump in the left breast, lower outer quadrant: Secondary | ICD-10-CM

## 2017-06-18 DIAGNOSIS — N6320 Unspecified lump in the left breast, unspecified quadrant: Secondary | ICD-10-CM

## 2017-06-18 HISTORY — PX: BREAST CYST ASPIRATION: SHX578

## 2017-06-18 HISTORY — DX: Unspecified lump in the left breast, unspecified quadrant: N63.20

## 2017-06-18 NOTE — Progress Notes (Addendum)
Patient ID: Denise [name], female   DOB: 1975/12/31, 41 y.o.   MRN: [id number]  Chief Complaint  Patient presents with  . Mass    HPI Denise [name] is a 41 y.o. female here today for a evaluation of a left breast mass. She found this about a month ago during her period. She reports it may have changed size or may be a different mass. She reports some off and on pain. Her last mammogram was in June. HPI  No past medical history on file.  Past Surgical History:  Procedure Laterality Date  . BREAST BIOPSY Bilateral 09/08/14   neg  . BREAST BIOPSY Left 03/16/2015   Procedure: BREAST BIOPSY WITH NEEDLE LOCALIZATION;  Surgeon: [name], MD;  Location: [facility] ORS;  Service: General;  Laterality: Left;  . BREAST EXCISIONAL BIOPSY Left 09/2014  . BREAST SURGERY Right March 2000   benign fibroadenoma  . BREAST SURGERY Left 08/08/13   excision  . BREAST SURGERY Left 09/23/14   excision    Family History  Problem Relation Age of Onset  . Cancer Paternal Grandmother        breast  . Breast cancer Paternal Grandmother   . Cancer Maternal Aunt        breast  . Breast cancer Maternal Aunt   . Breast cancer Paternal Aunt 62    Social History Social History   Tobacco Use  . Smoking status: Never Smoker  . Smokeless tobacco: Never Used  Substance Use Topics  . Alcohol use: Yes    Comment: occasionally  . Drug use: No    No Known Allergies  Current Outpatient Medications  Medication Sig Dispense Refill  . TRI-SPRINTEC 0.18/0.215/0.25 MG-35 MCG tablet Take 1 tablet by mouth daily.     No current facility-administered medications for this visit.     Review of Systems Review of Systems  Constitutional: Negative.   Respiratory: Negative.   Cardiovascular: Negative.     Blood pressure (!) 144/80, pulse 80, resp. rate 14, height 5\' 7"  (1.702 m), weight 184 lb (83.5 kg).  Physical Exam Physical Exam  Constitutional: She is oriented to person, place, and time. She  appears well-developed and well-nourished.  Eyes: Conjunctivae are normal. No scleral icterus.  Neck: Neck supple.  Cardiovascular: Normal rate, regular rhythm and normal heart sounds.  Pulmonary/Chest: Effort normal and breath sounds normal. Right breast exhibits no inverted nipple, no mass, no nipple discharge, no skin change and no tenderness. Left breast exhibits no inverted nipple, no mass, no nipple discharge, no skin change and no tenderness.    Lymphadenopathy:    She has no cervical adenopathy.    She has no axillary adenopathy.  Neurological: She is alert and oriented to person, place, and time.  Skin: Skin is warm and dry.  Psychiatric: She has a normal mood and affect.    Data Reviewed Ultrasound examination of the left breast in the area of palpable thickening in the 3 o'clock position approximately 4 cm from the nipple showed a well-defined anechoic lesion, good posterior acoustic enhancement and underlying fat necrosis as well as a small hypoechoic nodule just lateral to this with dense shadowing thought to represent scarring.  There is calcifications are identified inferior to the cystic mass at the 3 o'clock position, 3 cm from the nipple.  The cystic lesion measured 0.4 x 0.9 x 1.0 cm.  This is similar to appearance is that identified on September 08, 2014 when aspiration was undertaken.  The more solid area measuring 0.4 x 0.78 x 0.86 cm.  The patient was amenable to aspiration of the palpable cystic lesion.  This was completed with 1% Xylocaine and was well-tolerated.  The area completely resolved with return of viscus fluid.  Area completely resolved.  The smaller more lateral solid lesion was not clinically palpable.  BI-RADS-3.  Assessment    Palpable breast cyst, resolved on aspiration.  Hypoechoic nodule in the area of previous excision of a fibroadenoma with flat epithelial atypia in 2015, margins negative.  Left, 3:00, 4 cm from the nipple)    Plan    We will plan  for a screening mammogram exam as scheduled in spring 2019 with an office visit to follow.  Ultrasound reassessment of the 3:00 area will be completed at that time.     A diagram of the February 2015 through December 2018 breast procedures on the left side is scanned into the media section of the chart.  HPI, Physical Exam, Assessment and Plan have been scribed under the direction and in the presence of [name], MD  Concepcion Living, LPN  I have completed the exam and reviewed the above documentation for accuracy and completeness.  I agree with the above.  Haematologist has been used and any errors in dictation or transcription are unintentional.  Hervey Ard, M.D., F.A.C.S.    [name] 06/19/2017, 8:50 AM

## 2017-06-19 DIAGNOSIS — N6002 Solitary cyst of left breast: Secondary | ICD-10-CM | POA: Insufficient documentation

## 2017-08-02 ENCOUNTER — Encounter: Payer: Self-pay | Admitting: *Deleted

## 2017-08-06 ENCOUNTER — Ambulatory Visit (INDEPENDENT_AMBULATORY_CARE_PROVIDER_SITE_OTHER): Payer: BC Managed Care – PPO | Admitting: Obstetrics and Gynecology

## 2017-08-06 ENCOUNTER — Other Ambulatory Visit: Payer: Self-pay

## 2017-08-06 ENCOUNTER — Encounter: Payer: Self-pay | Admitting: Obstetrics and Gynecology

## 2017-08-06 VITALS — BP 118/78 | HR 84 | Ht 67.0 in | Wt 187.0 lb

## 2017-08-06 DIAGNOSIS — Z3041 Encounter for surveillance of contraceptive pills: Secondary | ICD-10-CM | POA: Diagnosis not present

## 2017-08-06 DIAGNOSIS — Z9189 Other specified personal risk factors, not elsewhere classified: Secondary | ICD-10-CM

## 2017-08-06 DIAGNOSIS — Z1589 Genetic susceptibility to other disease: Secondary | ICD-10-CM

## 2017-08-06 DIAGNOSIS — Z1501 Genetic susceptibility to malignant neoplasm of breast: Secondary | ICD-10-CM | POA: Diagnosis not present

## 2017-08-06 DIAGNOSIS — Z1509 Genetic susceptibility to other malignant neoplasm: Secondary | ICD-10-CM | POA: Diagnosis not present

## 2017-08-06 DIAGNOSIS — Z01419 Encounter for gynecological examination (general) (routine) without abnormal findings: Secondary | ICD-10-CM | POA: Diagnosis not present

## 2017-08-06 DIAGNOSIS — Z1239 Encounter for other screening for malignant neoplasm of breast: Secondary | ICD-10-CM

## 2017-08-06 DIAGNOSIS — Z1211 Encounter for screening for malignant neoplasm of colon: Secondary | ICD-10-CM | POA: Diagnosis not present

## 2017-08-06 DIAGNOSIS — Z1231 Encounter for screening mammogram for malignant neoplasm of breast: Secondary | ICD-10-CM

## 2017-08-06 DIAGNOSIS — Z148 Genetic carrier of other disease: Secondary | ICD-10-CM | POA: Insufficient documentation

## 2017-08-06 DIAGNOSIS — Z1502 Genetic susceptibility to malignant neoplasm of ovary: Secondary | ICD-10-CM | POA: Diagnosis not present

## 2017-08-06 MED ORDER — NORGESTIM-ETH ESTRAD TRIPHASIC 0.18/0.215/0.25 MG-35 MCG PO TABS: 1.0000 | ORAL_TABLET | Freq: Every day | ORAL | 3 refills | Status: DC

## 2017-08-06 NOTE — Patient Instructions (Signed)
I value your feedback and entrusting us with your care. If you get a [facility] patient survey, I would appreciate you taking the time to let us know about your experience today. Thank you! 

## 2017-08-06 NOTE — Progress Notes (Signed)
PCP:  Patient, No Pcp Per   Chief Complaint  Patient presents with  . Gynecologic Exam    No complaints    HPI:      Ms. Denise [name] is a 42 y.o. G2P2 who LMP was Patient's last menstrual period was 07/15/2017., presents today for her annual examination.  Her menses are regular every 28-30 days, lasting 5 days.  Dysmenorrhea none. She does not have intermenstrual bleeding.  Sex activity: single partner, contraception - OCP (estrogen/progesterone).  Last Pap: July 14, 2015  Results were: no abnormalities /neg HPV DNA  Hx of STDs: none  Last mammogram: December 05, 2016  Results were: normal--routine follow-up in 12 months There is a FH of breast cancer in her mat aunt, pat aunt and PGM. There is a FH of ovarian cancer in her mat aunt, and now pancreatic cancer in a 2rd mat aunt. 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 CHEK2 is increased risk of breast and colon cancer. She is s/p LT breast masses on mammo and MRI with PASH, atypia and fibroadenomas. IBIS=47%. The patient does do self-breast exams. She is taking Vit D supp. She declines scr breast MRI yearly for now due to cost. She has considered prophylactic mastectomy/reconstruction due to breast hx and CHEK2 but is following for right now. No other fam members have done genetic testing, which would give [place] more info since 2 mutations can be present in the family and since pt has FH on both sides. No FH colon cancer.   Tobacco use: The patient denies current or previous tobacco use. Alcohol use: social drinker No drug use.  Exercise: moderately active  She does get adequate calcium and Vitamin D in her diet.  Past Medical History:  Diagnosis Date  . Abnormal breast biopsy    PASH, [name]  . Family history of breast cancer   . Family history of ovarian cancer   . Fibroadenoma of breast, left 2000, 2016  . Genetic screening 08/26/2013   BRCA/BART negative/Myriad, CHEK2 POS 2017  . Increased risk of breast cancer 2017     IBIS=47%  . Left breast lump 06/18/2017   Fluid/drained [name]  . Migraine   . Monoallelic mutation of CHEK2 gene in female patient 2017   increased risk of breast and colon cancer    Past Surgical History:  Procedure Laterality Date  . BREAST BIOPSY Bilateral 09/08/14   neg  . BREAST BIOPSY Left 03/16/2015   Procedure: BREAST BIOPSY WITH NEEDLE LOCALIZATION;  Surgeon: [name], MD;  Location: [facility] ORS;  Service: General;  Laterality: Left;  . BREAST EXCISIONAL BIOPSY Left 09/2014  . BREAST SURGERY Right March 2000   benign fibroadenoma  . BREAST SURGERY Left 08/08/13   excision  . BREAST SURGERY Left 09/23/14   excision    Family History  Problem Relation Age of Onset  . Breast cancer Paternal Grandmother 42  . Breast cancer Maternal Aunt 60  . Ovarian cancer Maternal Aunt 70  . Breast cancer Paternal Aunt 29  . Pancreatic cancer Maternal Aunt 75    Social History   Socioeconomic History  . Marital status: Married    Spouse name: Not on file  . Number of children: Not on file  . Years of education: Not on file  . Highest education level: Not on file  Social Needs  . Financial resource strain: Not on file  . Food insecurity - worry: Not on file  . Food insecurity -  inability: Not on file  . Transportation needs - medical: Not on file  . Transportation needs - non-medical: Not on file  Occupational History  . Not on file  Tobacco Use  . Smoking status: Never Smoker  . Smokeless tobacco: Never Used  Substance and Sexual Activity  . Alcohol use: Yes    Comment: occasionally  . Drug use: No  . Sexual activity: Yes    Birth control/protection: Pill  Other Topics Concern  . Not on file  Social History Narrative  . Not on file    No outpatient medications have been marked as taking for the 08/06/17 encounter (Office Visit) with Copland, Deirdre Evener, PA-C.     ROS:  Review of Systems  Constitutional: Negative for fatigue, fever and unexpected weight  change.  Respiratory: Negative for cough, shortness of breath and wheezing.   Cardiovascular: Negative for chest pain, palpitations and leg swelling.  Gastrointestinal: Negative for blood in stool, constipation, diarrhea, nausea and vomiting.  Endocrine: Negative for cold intolerance, heat intolerance and polyuria.  Genitourinary: Negative for dyspareunia, dysuria, flank pain, frequency, genital sores, hematuria, menstrual problem, pelvic pain, urgency, vaginal bleeding, vaginal discharge and vaginal pain.  Musculoskeletal: Negative for back pain, joint swelling and myalgias.  Skin: Negative for rash.  Neurological: Negative for dizziness, syncope, light-headedness, numbness and headaches.  Hematological: Negative for adenopathy.  Psychiatric/Behavioral: Negative for agitation, confusion, sleep disturbance and suicidal ideas. The patient is not nervous/anxious.      Objective: BP 118/78 (BP Location: Left Arm, Patient Position: Sitting, Cuff Size: Normal)   Pulse 84   Ht 5' 7"  (1.702 m)   Wt 187 lb (84.8 kg)   LMP 07/15/2017   BMI 29.29 kg/m    Physical Exam  Constitutional: She is oriented to person, place, and time. She appears well-developed and well-nourished.  Genitourinary: Vagina normal and uterus normal. There is no rash or tenderness on the right labia. There is no rash or tenderness on the left labia. No erythema or tenderness in the vagina. No vaginal discharge found. Right adnexum does not display mass and does not display tenderness. Left adnexum does not display mass and does not display tenderness. Cervix does not exhibit motion tenderness or polyp. Uterus is not enlarged or tender.  Neck: Normal range of motion. No thyromegaly present.  Cardiovascular: Normal rate, regular rhythm and normal heart sounds.  No murmur heard. Pulmonary/Chest: Effort normal and breath sounds normal. Right breast exhibits no mass, no nipple discharge, no skin change and no tenderness. Left  breast exhibits no mass, no nipple discharge, no skin change and no tenderness.  Abdominal: Soft. There is no tenderness. There is no guarding.  Musculoskeletal: Normal range of motion.  Neurological: She is alert and oriented to person, place, and time. No cranial nerve deficit.  Psychiatric: She has a normal mood and affect. Her behavior is normal.  Vitals reviewed.   Assessment/Plan: Encounter for annual routine gynecological examination  Monoallelic mutation of CHEK2 gene in female patient - Increased risk of breast and colon cancer.  Screening for breast cancer - Pt followed by [name]. Mammo due 6/19.  Increased risk of breast cancer - IBIS=47%. Cont monthly SBE, yearly CBE and mammos. Offered scr br MRI if pt desires. Cont VIt D supp. Followed by [name]. Aware of preventive surg.  Screening for colon cancer - CHEK2 recommendations are for scr colonoscopy starting age 81 and Q5 yrs. Suggested pt discuss with [name] at 6/19 appt since he does  them.   Encounter for surveillance of contraceptive pills - OCP RF - Plan: Norgestimate-Ethinyl Estradiol Triphasic (TRI-SPRINTEC) 0.18/0.215/0.25 MG-35 MCG tablet  Meds ordered this encounter  Medications  . Norgestimate-Ethinyl Estradiol Triphasic (TRI-SPRINTEC) 0.18/0.215/0.25 MG-35 MCG tablet    Sig: Take 1 tablet by mouth daily.    Dispense:  3 Package    Refill:  3    Order Specific Question:   Supervising Provider    Answer:   Gae Dry [[id number]]             GYN counsel breast self exam, mammography screening, adequate intake of calcium and vitamin D, diet and exercise     F/U  Return in about 1 year (around 08/06/2018).  Alicia B. Copland, PA-C 08/06/2017 9:50 AM

## 2017-09-27 ENCOUNTER — Other Ambulatory Visit: Payer: Self-pay

## 2017-09-27 DIAGNOSIS — Z1231 Encounter for screening mammogram for malignant neoplasm of breast: Secondary | ICD-10-CM

## 2017-11-17 DIAGNOSIS — C50919 Malignant neoplasm of unspecified site of unspecified female breast: Secondary | ICD-10-CM

## 2017-11-17 HISTORY — PX: BREAST BIOPSY: SHX20

## 2017-11-17 HISTORY — DX: Malignant neoplasm of unspecified site of unspecified female breast: C50.919

## 2017-11-29 ENCOUNTER — Other Ambulatory Visit: Payer: Self-pay | Admitting: General Surgery

## 2017-11-29 ENCOUNTER — Ambulatory Visit
Admission: RE | Admit: 2017-11-29 | Discharge: 2017-11-29 | Disposition: A | Discharge status: Home / Self Care | Payer: BC Managed Care – PPO | Source: Ambulatory Visit | Attending: General Surgery | Admitting: General Surgery

## 2017-11-29 DIAGNOSIS — N6320 Unspecified lump in the left breast, unspecified quadrant: Secondary | ICD-10-CM

## 2017-11-29 DIAGNOSIS — Z1231 Encounter for screening mammogram for malignant neoplasm of breast: Secondary | ICD-10-CM | POA: Diagnosis not present

## 2017-11-29 DIAGNOSIS — R928 Other abnormal and inconclusive findings on diagnostic imaging of breast: Secondary | ICD-10-CM

## 2017-12-06 ENCOUNTER — Ambulatory Visit: Payer: Self-pay

## 2017-12-06 ENCOUNTER — Ambulatory Visit
Admission: RE | Admit: 2017-12-06 | Discharge: 2017-12-06 | Disposition: A | Discharge status: Home / Self Care | Payer: BC Managed Care – PPO | Source: Ambulatory Visit | Attending: General Surgery | Admitting: General Surgery

## 2017-12-06 ENCOUNTER — Encounter: Payer: Self-pay | Admitting: General Surgery

## 2017-12-06 ENCOUNTER — Ambulatory Visit: Payer: BC Managed Care – PPO | Admitting: General Surgery

## 2017-12-06 VITALS — BP 128/88 | HR 98 | Resp 20 | Ht 67.0 in | Wt 185.0 lb

## 2017-12-06 DIAGNOSIS — N6320 Unspecified lump in the left breast, unspecified quadrant: Secondary | ICD-10-CM | POA: Insufficient documentation

## 2017-12-06 DIAGNOSIS — N6322 Unspecified lump in the left breast, upper inner quadrant: Secondary | ICD-10-CM | POA: Diagnosis not present

## 2017-12-06 DIAGNOSIS — R590 Localized enlarged lymph nodes: Secondary | ICD-10-CM

## 2017-12-06 DIAGNOSIS — R928 Other abnormal and inconclusive findings on diagnostic imaging of breast: Secondary | ICD-10-CM | POA: Insufficient documentation

## 2017-12-06 NOTE — Progress Notes (Signed)
Patient ID: Denise [name], female   DOB: 1976-05-10, 42 y.o.   MRN: [id number]  Chief Complaint  Patient presents with  . Follow-up    HPI Denise [name] is a 42 y.o. female.  who presents for a breast evaluation. The most recent mammogram was done on 11-29-17.  Patient does perform regular self breast checks and gets regular mammograms done.   No new breast issues but she states the mammogram was abnormal and an ultrasound was done today. She is here with her husband, Denise [name].  HPI  Past Medical History:  Diagnosis Date  . Abnormal breast biopsy    PASH, Dr. Lemar Livings  . Family history of breast cancer   . Family history of ovarian cancer   . Fibroadenoma of breast, left 2000, 2016  . Genetic screening 08/26/2013   BRCA/BART negative/Myriad, CHEK2 POS 2017  . Increased risk of breast cancer 2017   IBIS=47%  . Left breast lump 06/18/2017   Fluid/drained J Demitri Kucinski  . Migraine   . Monoallelic mutation of CHEK2 gene in female patient 2017   increased risk of breast and colon cancer    Past Surgical History:  Procedure Laterality Date  . BREAST BIOPSY Bilateral 09/08/14   neg  . BREAST BIOPSY Left 03/16/2015   Procedure: BREAST BIOPSY WITH NEEDLE LOCALIZATION;  Surgeon: Earline Mayotte, MD;  Location: [facility] ORS;  Service: General;  Laterality: Left;  . BREAST CYST ASPIRATION Left 06/18/2017   cyst aspiration  . BREAST EXCISIONAL BIOPSY Left 09/2014  . BREAST EXCISIONAL BIOPSY Right    age 9's  . BREAST SURGERY Right March 2000   benign fibroadenoma  . BREAST SURGERY Left 08/08/13   excision  . BREAST SURGERY Left 09/23/14   excision    Family History  Problem Relation Age of Onset  . Prostate cancer Father 30  . Breast cancer Paternal Grandmother 10  . Breast cancer Maternal Aunt 60  . Ovarian cancer Maternal Aunt 70  . Breast cancer Paternal Aunt 24  . Pancreatic cancer Maternal Aunt 31    Social History Social History   Tobacco Use  . Smoking status: Never  Smoker  . Smokeless tobacco: Never Used  Substance Use Topics  . Alcohol use: Yes    Comment: occasionally  . Drug use: No    No Known Allergies  Current Outpatient Medications  Medication Sig Dispense Refill  . Norgestimate-Ethinyl Estradiol Triphasic (TRI-SPRINTEC) 0.18/0.215/0.25 MG-35 MCG tablet Take 1 tablet by mouth daily. 3 Package 3   No current facility-administered medications for this visit.     Review of Systems Review of Systems  Constitutional: Negative.   Respiratory: Negative.   Cardiovascular: Negative.     Blood pressure 128/88, pulse 98, resp. rate 20, height 5\' 7"  (1.702 m), weight 185 lb (83.9 kg), last menstrual period 12/02/2017.  Physical Exam Physical Exam  Constitutional: She is oriented to person, place, and time. She appears well-developed and well-nourished.  HENT:  Mouth/Throat: Oropharynx is clear and moist.  Eyes: Conjunctivae are normal. No scleral icterus.  Neck: Neck supple.  Cardiovascular: Normal rate, regular rhythm and normal heart sounds.  Pulmonary/Chest: Effort normal and breath sounds normal. Right breast exhibits no inverted nipple, no mass, no nipple discharge, no skin change and no tenderness. Left breast exhibits no inverted nipple, no mass, no nipple discharge, no skin change and no tenderness.    Lymphadenopathy:    She has no cervical adenopathy.    She has no axillary adenopathy.  Left: No supraclavicular adenopathy present.  Neurological: She is alert and oriented to person, place, and time.  Skin: Skin is warm and dry.  Psychiatric: Her behavior is normal.    Data Reviewed Bilateral mammograms and left breast ultrasound of November 29, 2017 reviewed.  Additional views and ultrasound completed earlier today reviewed.  Multiple findings, none in the previously biopsied areas of the lower outer quadrant of the left breast.  Right breast remains unremarkable.  Assessment    New, worrisome mammographic/ultrasound  findings involving the left breast.    Plan    We discussed the radiologic findings, and the patient is at a point where she is ready to let the left breast live elsewhere.  The importance of determining if malignancy is present was reviewed, and the patient is amenable to biopsy of the 2 most concerning areas 1) the new multilobulated mass in the 11 o'clock position of the breast and 2 the left axillary node which shows some thickening of the cortex.  Whether she has satellite lesions or not, and the posterior extent of calcifications will be immaterial if she chooses to undergo mastectomy.  Ultrasound examination of the upper inner quadrant of the left breast 3 cm from the nipple showed an irregular ill-defined hypoechoic mass measuring 1.1 x 1.38 x 1.45 cm.  The left axilla showed an enlarged node measuring 1.17 x 1.86 x 2.10 cm.  This is at the lower level of the axillary envelope.  Each area was infiltrated with 10 cc of 0.5% Xylocaine with 0.25% Marcaine with 1 to 200,000 units of epinephrine.  ChloraPrep was applied to the breast in the area draped.  A small incision was made and a 14-gauge Finesse biopsy device was passed under ultrasound guidance into the axillary node.  4 core samples were obtained with a good volume of tissue noted.  No bleeding.  Transient pain which passed with needle removal.  Postbiopsy clip placed.  The left breast mass was then approached in a similar fashion.  Pre-and post fire imaging showed the mass having been transversed in 2 separate areas were chosen for biopsy.  A core samples were obtained.  No significant bleeding noted.  Both sites were closed with benzoin and Steri-Strip followed by Telfa and Tegaderm dressing.  Post biopsy instructions reviewed.  The procedure was well-tolerated with transient pain.  The patient will be contacted when biopsy results are available.     HPI, Physical Exam, Assessment and Plan have been scribed under the direction and in  the presence of Earline Mayotte, MD. Dorathy Daft, RN  I have completed the exam and reviewed the above documentation for accuracy and completeness.  I agree with the above.  Museum/gallery conservator has been used and any errors in dictation or transcription are unintentional.  Donnalee Curry, M.D., F.A.C.S.  Merrily Pew Shardea Cwynar 12/06/2017, 9:53 PM

## 2017-12-06 NOTE — Patient Instructions (Addendum)

## 2017-12-07 ENCOUNTER — Other Ambulatory Visit: Payer: Self-pay

## 2017-12-07 DIAGNOSIS — C50212 Malignant neoplasm of upper-inner quadrant of left female breast: Secondary | ICD-10-CM

## 2017-12-10 ENCOUNTER — Ambulatory Visit
Admission: RE | Admit: 2017-12-10 | Discharge: 2017-12-10 | Disposition: A | Discharge status: Home / Self Care | Payer: BC Managed Care – PPO | Source: Ambulatory Visit | Attending: General Surgery | Admitting: General Surgery

## 2017-12-10 DIAGNOSIS — C50212 Malignant neoplasm of upper-inner quadrant of left female breast: Secondary | ICD-10-CM

## 2017-12-10 MED ORDER — GADOBENATE DIMEGLUMINE 529 MG/ML IV SOLN
15.0000 mL | Freq: Once | INTRAVENOUS | Status: AC | PRN
  Administered 2017-12-10: 15 mL via INTRAVENOUS

## 2017-12-12 ENCOUNTER — Encounter: Payer: Self-pay | Admitting: Urgent Care

## 2017-12-12 ENCOUNTER — Inpatient Hospital Stay: Payer: BC Managed Care – PPO | Attending: Hematology and Oncology | Admitting: Hematology and Oncology

## 2017-12-12 ENCOUNTER — Inpatient Hospital Stay: Payer: BC Managed Care – PPO

## 2017-12-12 VITALS — BP 144/89 | HR 103 | Temp 98.3°F | Resp 18 | Wt 183.9 lb

## 2017-12-12 DIAGNOSIS — C50412 Malignant neoplasm of upper-outer quadrant of left female breast: Secondary | ICD-10-CM

## 2017-12-12 DIAGNOSIS — Z170 Estrogen receptor positive status [ER+]: Secondary | ICD-10-CM | POA: Diagnosis not present

## 2017-12-12 DIAGNOSIS — C50212 Malignant neoplasm of upper-inner quadrant of left female breast: Secondary | ICD-10-CM | POA: Diagnosis present

## 2017-12-12 DIAGNOSIS — C773 Secondary and unspecified malignant neoplasm of axilla and upper limb lymph nodes: Secondary | ICD-10-CM | POA: Diagnosis not present

## 2017-12-12 DIAGNOSIS — Z803 Family history of malignant neoplasm of breast: Secondary | ICD-10-CM | POA: Insufficient documentation

## 2017-12-12 LAB — COMPREHENSIVE METABOLIC PANEL
ALT: 18 U/L (ref 0–44)
ANION GAP: 11 (ref 5–15)
AST: 23 U/L (ref 15–41)
Albumin: 4.1 g/dL (ref 3.5–5.0)
Alkaline Phosphatase: 53 U/L (ref 38–126)
BUN: 9 mg/dL (ref 6–20)
CALCIUM: 9 mg/dL (ref 8.9–10.3)
CO2: 24 mmol/L (ref 22–32)
CREATININE: 0.66 mg/dL (ref 0.44–1.00)
Chloride: 103 mmol/L (ref 98–111)
Glucose, Bld: 117 mg/dL — ABNORMAL HIGH (ref 70–99)
Potassium: 3.8 mmol/L (ref 3.5–5.1)
Sodium: 138 mmol/L (ref 135–145)
TOTAL PROTEIN: 7.7 g/dL (ref 6.5–8.1)
Total Bilirubin: 1 mg/dL (ref 0.3–1.2)

## 2017-12-12 LAB — CBC WITH DIFFERENTIAL/PLATELET
BASOS ABS: 0 10*3/uL (ref 0–0.1)
BASOS PCT: 1 %
EOS ABS: 0.1 10*3/uL (ref 0–0.7)
Eosinophils Relative: 1 %
HEMATOCRIT: 38.6 % (ref 35.0–47.0)
Hemoglobin: 13.1 g/dL (ref 12.0–16.0)
Lymphocytes Relative: 26 %
Lymphs Abs: 2 10*3/uL (ref 1.0–3.6)
MCH: 28.6 pg (ref 26.0–34.0)
MCHC: 34 g/dL (ref 32.0–36.0)
MCV: 84.1 fL (ref 80.0–100.0)
Monocytes Absolute: 0.4 10*3/uL (ref 0.2–0.9)
Monocytes Relative: 6 %
NEUTROS PCT: 66 %
Neutro Abs: 5.1 10*3/uL (ref 1.4–6.5)
Platelets: 203 10*3/uL (ref 150–440)
RBC: 4.6 MIL/uL (ref 3.80–5.20)
RDW: 13.2 % (ref 11.5–14.5)
WBC: 7.7 10*3/uL (ref 3.6–11.0)

## 2017-12-12 NOTE — Progress Notes (Signed)
  Oncology Nurse Navigator Documentation  Navigator Location: [facility] (12/12/17 1700)   )Navigator Encounter Type: Introductory phone call (12/12/17 1700)                         Barriers/Navigation Needs: Education (12/12/17 1700)   Interventions: Psycho-social support;Education (12/12/17 1700)        Support Groups/Services: [facility];Breast Support Group (12/12/17 1700)             Time Spent with Patient: 30 (12/12/17 1700)   Spoke to Denise [name] post patients initial Med Onc visit today. Phoned patient to introduce navigation service.  Patient has 2 daughters, and would like information on support group for her children.  Would like to meet Friday if possible.  She is spending a girls day with her 1 year younger daughter tomorrow. Will call tomorrow to schedule  meeting.

## 2017-12-12 NOTE — Progress Notes (Signed)
Patient here today as new evaluation regarding breast cancer.  Referred by [name].  Patient is accompanied by her husband, Denise [name] today.

## 2017-12-12 NOTE — Progress Notes (Signed)
[facility] day:  12/12/2017  Chief Complaint: Denise [name] is a 42 y.o. female with left breast cancer who is referred in consultation by [name] for assessment and management.  HPI: The patient has a history of multiple benign breast biopsies.  Patient had her first abnormal mammogram when she was in her 25s. Ultrasound core biopsy of the right breast at 6 o'clock in 08/2014 revealed intraductal papilloma.  Excision was not performed.  Breast MRI in 05/2015 demonstrated a 4 mm enhancing left breast mass for which biopsy was recommended, but not performed (mammographically and sonographically occult).  Bilateral diagnostic breast mammogram 11/28/2016 revealed stable biopsy-proven intraductal papilloma over the 6 o'clock position of the right breast.  Screening bilateral mammogram on 11/29/2017 revealed  Calcifications, possible mass, and possible left axillary adenopathy in the left breast.  There were no suspicious findings in the right breast.  Diagnostic left mammogram and ultrasound on 12/06/2017 revealed a 1.7 x 1.3 x 1.3 cm irregular hypoechoic mass with internal calcifications at the 11 o'clock position 2 cm from the nipple with internal calcifications. There was a similar appearing 0.9 x 0.8 x 0.7 cm mass at the 11 o'clock position 4 cm from the nipple (2.4 cm from the index lesion).  There were suspicious, segmental calcifications originating from the index lesion and extending 5 cm posteriorly.  There was a 0.6 cm morphologically abnormal left axillary lymph node.  She underwent left breast mass (index lesion) and axillary node biopsy on 12/06/2017.  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  There was metastatic carcinoma in 1 of 1 lymph nodes.  Tumor was ER + (100%), PR + (100%), and Ki67 5%.    Bilateral breast MRI on 12/10/2017 revealed the previously biopsied papilloma in the  right breast  There were no other suspicious changes on the right. The recently biopsied malignancy in the left breast (15 x 16 x 16 mm) was identified. The adjacent and more superiorly located 11 o'clock mass (7 x 12 mm) seen on recent ultrasound, 4 cm from the nipple on ultrasound, was also identified. There were 2 probable satellite lesions (7 mm, medial lesion; posterior and lateral lesion).  Assuming the probable satellite lesions are malignant, the total span of malignancy on the left was 3 x 3.1 x 4.5 cm.  There were calcifications extending up to 5 cm posterior to the biopsied malignancy which were highly suspicious on mammography but not appreciated.  There was a mass in the lower outer left breast which measured 5 mm, representing a change.  The known metastatic node in the left axilla was identified. A node along the posterolateral margin of the pectoralis minor (cortex 5 mm) was nonspecific but at least somewhat suspicious.  She has a family history of cancer:  Maternal aunt - recurrent breast cancer, ovarian cancer (age 67s)  Maternal aunt - pancreatic cancer (age 38s)  Maternal uncle - prostate cancer (age 56s)  Paternal grandmother - breast cancer (age 68s)  Paternal aunt - breast cancer (age 51s)  Father - prostate cancer (age 10)  Genetic testing on 08/26/2013 was negative for BRCA, BART negative. 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  She is premenopausal.  She had her first menses at age 37.  She is on birth control pills.  First pregnancy was at age 67.  She breast fed her children.  Patient follows up with surgery ([name])  every 6 months. She has routine follow up mammograms every 6 months due to her significant history.   Symptomatically, patient offers no significant complaints today. Patient  does perform monthly self breast examinations as recommended. She denies B symptoms and recurrent infections.   Patient advises that she  maintains an adequate appetite. She is eating well. Weight today is 183 lb 13.8 oz (83.4 kg).  Patient denies pain in the clinic today.   Past Medical History:  Diagnosis Date  . Abnormal breast biopsy    [facility], [name]  . Family history of breast cancer   . Family history of ovarian cancer   . Fibroadenoma of breast, left 2000, 2016  . Genetic screening 08/26/2013   BRCA/BART negative/[facility], CHEK2 POS 2017  . Increased risk of breast cancer 2017   IBIS=47%  . Left breast lump 06/18/2017   Fluid/drained [name]  . Migraine   . Monoallelic mutation of CHEK2 gene in female patient 2017   increased risk of breast and colon cancer    Past Surgical History:  Procedure Laterality Date  . BREAST BIOPSY Bilateral 09/08/14   neg  . BREAST BIOPSY Left 03/16/2015   Procedure: BREAST BIOPSY WITH NEEDLE LOCALIZATION;  Surgeon: [name], MD;  Location: [facility] ORS;  Service: General;  Laterality: Left;  . BREAST CYST ASPIRATION Left 06/18/2017   cyst aspiration  . BREAST EXCISIONAL BIOPSY Left 09/2014  . BREAST EXCISIONAL BIOPSY Right    age 60's  . BREAST SURGERY Right March 2000   benign fibroadenoma  . BREAST SURGERY Left 08/08/13   excision  . BREAST SURGERY Left 09/23/14   excision    Family History  Problem Relation Age of Onset  . Prostate cancer Father 65  . Breast cancer Paternal Grandmother 63  . Breast cancer Maternal Aunt 60  . Ovarian cancer Maternal Aunt 70  . Breast cancer Paternal Aunt 1  . Pancreatic cancer Maternal Aunt 75    Social History:  reports that she has never smoked. She has never used smokeless tobacco. She reports that she drinks alcohol. She reports that she does not use drugs.  She does not smoke. She "occasionally" drinks alcohol. 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 Patient denies known exposures to radiation on toxins. Her husband's name is [name]. She has 2 daughters, [name] and [name] (ages 58 1/2 and 42).   The patient is accompanied by her husband, [name], today.  Allergies: No Known Allergies  Current Medications: Current Outpatient Medications  Medication Sig Dispense Refill  . Norgestimate-Ethinyl Estradiol Triphasic (TRI-SPRINTEC) 0.18/0.215/0.25 MG-35 MCG tablet Take 1 tablet by mouth daily. 3 Package 3   No current facility-administered medications for this visit.     Review of Systems:  GENERAL:  Feels fine.  No fevers, sweats or weight loss. PERFORMANCE STATUS (ECOG):  0 HEENT:  Little congestion.  No visual changes, runny nose, sore throat, mouth sores or tenderness. Lungs: No shortness of breath or cough.  No hemoptysis. Cardiac:  No chest pain, palpitations, orthopnea, or PND. GI:  No nausea, vomiting, diarrhea, constipation, melena or hematochezia. GU:  No urgency, frequency, dysuria, or hematuria. Musculoskeletal:  No back pain.  No joint pain.  No muscle tenderness. Extremities:  No pain or swelling. Skin:  No rashes or skin changes. Neuro:  No headache, numbness or weakness, balance or coordination issues. Endocrine:  No diabetes, thyroid issues, hot flashes or night sweats. Psych:  No mood changes, depression or anxiety.  Pain:  No focal pain. Review of systems:  All other systems reviewed and found to be negative.  Physical Exam: Blood pressure (!) 144/89, pulse (!) 103, temperature 98.3 F (36.8 C), temperature source Tympanic, resp. rate 18, weight 183 lb 13.8 oz (83.4 kg), last menstrual period 12/02/2017. GENERAL:  Well developed, well nourished, woman sitting comfortably in the exam room in no acute distress. MENTAL STATUS:  Alert and oriented to person, place and time. HEAD:  Long brown hair.  Normocephalic, atraumatic, face symmetric, no Cushingoid features. EYES:  Pupils equal round and reactive to light and accomodation.  No conjunctivitis or scleral icterus. ENT:  Oropharynx clear without lesion.  Tongue normal. Mucous membranes moist.  RESPIRATORY:  Clear to  auscultation without rales, wheezes or rhonchi. CARDIOVASCULAR:  Regular rate and rhythm without murmur, rub or gallop. BREAST:  Right breast with fibrocystic changes superiorly.  No discrete masses, skin changes or nipple discharge.  Left breast with post biopsy changes.  No discrete masses, skin changes or nipple discharge.  ABDOMEN:  Soft, non-tender, with active bowel sounds, and no hepatosplenomegaly.  No masses. SKIN:  No rashes, ulcers or lesions. EXTREMITIES: No edema, no skin discoloration or tenderness.  No palpable cords. LYMPH NODES: No palpable cervical, supraclavicular, axillary or inguinal adenopathy  NEUROLOGICAL: Unremarkable. PSYCH:  Appropriate.   Office Visit on 12/12/2017  Component Date Value Ref Range Status  . Sodium 12/12/2017 138  135 - 145 mmol/L Final  . Potassium 12/12/2017 3.8  3.5 - 5.1 mmol/L Final  . Chloride 12/12/2017 103  98 - 111 mmol/L Final   Please note change in reference range.  . CO2 12/12/2017 24  22 - 32 mmol/L Final  . Glucose, Bld 12/12/2017 117* 70 - 99 mg/dL Final   Please note change in reference range.  . BUN 12/12/2017 9  6 - 20 mg/dL Final   Please note change in reference range.  . Creatinine, Ser 12/12/2017 0.66  0.44 - 1.00 mg/dL Final  . Calcium 12/12/2017 9.0  8.9 - 10.3 mg/dL Final  . Total Protein 12/12/2017 7.7  6.5 - 8.1 g/dL Final  . Albumin 12/12/2017 4.1  3.5 - 5.0 g/dL Final  . AST 12/12/2017 23  15 - 41 U/L Final  . ALT 12/12/2017 18  0 - 44 U/L Final   Please note change in reference range.  . Alkaline Phosphatase 12/12/2017 53  38 - 126 U/L Final  . Total Bilirubin 12/12/2017 1.0  0.3 - 1.2 mg/dL Final  . GFR calc non Af Amer 12/12/2017 >60  >60 mL/min Final  . GFR calc Af Amer 12/12/2017 >60  >60 mL/min Final   Comment: (NOTE) The eGFR has been calculated using the CKD EPI equation. This calculation has not been validated in all clinical situations. eGFR's persistently <60 mL/min signify possible Chronic  Kidney Disease.   Georgiann Hahn gap 12/12/2017 11  5 - 15 Final   Performed at [facility], [address] [place], [place] [address]  . WBC 12/12/2017 7.7  3.6 - 11.0 K/uL Final  . RBC 12/12/2017 4.60  3.80 - 5.20 MIL/uL Final  . Hemoglobin 12/12/2017 13.1  12.0 - 16.0 g/dL Final  . HCT 12/12/2017 38.6  35.0 - 47.0 % Final  . MCV 12/12/2017 84.1  80.0 - 100.0 fL Final  . MCH 12/12/2017 28.6  26.0 - 34.0 pg Final  . MCHC 12/12/2017 34.0  32.0 - 36.0 g/dL Final  . RDW 12/12/2017 13.2  11.5 -  14.5 % Final  . Platelets 12/12/2017 203  150 - 440 K/uL Final  . Neutrophils Relative % 12/12/2017 66  % Final  . Neutro Abs 12/12/2017 5.1  1.4 - 6.5 K/uL Final  . Lymphocytes Relative 12/12/2017 26  % Final  . Lymphs Abs 12/12/2017 2.0  1.0 - 3.6 K/uL Final  . Monocytes Relative 12/12/2017 6  % Final  . Monocytes Absolute 12/12/2017 0.4  0.2 - 0.9 K/uL Final  . Eosinophils Relative 12/12/2017 1  % Final  . Eosinophils Absolute 12/12/2017 0.1  0 - 0.7 K/uL Final  . Basophils Relative 12/12/2017 1  % Final  . Basophils Absolute 12/12/2017 0.0  0 - 0.1 K/uL Final   Performed at [facility], [address], [place], [place] [address]    Assessment:  Denise [name] is a 42 y.o. female with multi-focal stage IB left breast cancer s/p index breast mass and axillary node biopsy on 12/06/2017.  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  There was metastatic carcinoma in 1 of 1 lymph nodes.  Tumor was ER + (100%), PR + (100%), and Ki67 5%.  Her2/neu is pending.  Diagnostic left mammogram and ultrasound on 12/06/2017 revealed a 1.7 x 1.3 x 1.3 cm irregular hypoechoic mass with internal calcifications at the 11 o'clock position 2 cm from the nipple with internal calcifications. There was a similar appearing 0.9 x 0.8 x 0.7 cm mass at the 11 o'clock position 4 cm from the nipple (2.4 cm from the index lesion).  There were suspicious,  segmental calcifications originating from the index lesion and extending 5 cm posteriorly.  There was a 0.6 cm morphologically abnormal left axillary lymph node.  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 The adjacent and more superiorly located 11 o'clock mass (7 x 12 mm) seen on recent ultrasound, 4 cm from the nipple on ultrasound, was also identified. There were 2 probable satellite lesions (7 mm, medial lesion; posterior and lateral lesion). The total span of malignancy was 3 x 3.1 x 4.5 cm.  There were calcifications extending up to 5 cm posterior to the biopsied malignancy which were highly suspicious on mammography but not appreciated.  There was a mass in the lower outer left breast measured 5 mm, representing a change.  The known metastatic node in the left axilla was identified. A node along the posterolateral margin of the pectoralis minor (cortex 5 mm) was nonspecific but at least somewhat suspicious.  [facility] genetic testing on 08/26/2013 was negative for BRCA1 and 2. 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  She has a family history significant for breast, ovarian, pancreatic, and prostate cancer.  Symptomatically, she feels fine.  Exam reveals post-biopsy changes.  Plan: 1. Discuss multifocal T1c N1 tumor. ER/PR (+). Her2/neu by FISH is pending.  Proliferation rate low (Ki67 5%).  2. Discuss treatment options:  Surgical - given her significant past oncological history, patient strongly considering BILATERAL radical mastectomy, even though disease is currently only in the LEFT breast.  Discussed Oncotype testing if tumor is Her2/neu negative.   Medical oncology - if tumor is HER2/neu (+) patient will require neo-adjuvant chemotherapy. Discussed using TCHP regimen, followed by adjuvant Herceptin/Perjeta x 1 year. If Her2/neu (-) disease  confirmed, patient will proceed with surgery as planned by [name]. Discuss  post surgical endocrine therapy using tamoxifen (as she is premenopausal) or an aromatase inhibitor and a GNRH agonist (goserelin). Side effects briefly reviewed.  3. Labs today: CBC with diff, CMP, CA27.29 4. RTC after testing and tumor board - will plan on calling patient.   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 Plans are to pursue neoadjuvant therapy, as discussed, with TCHP chemotherapy every 21 days x 6 cycles. Patient will continue with adjuvant immunotherapy with trastuzumab, trastuzumab + pertuzumab, or Kadcyla x 1 year based on pathology from surgery. Patient will need the following in order to proceed with treatment: 1. PET scan 2. Port placement with [name] 3. Pre- authorization of chemotherapy 4. Pre-chemotherapy echocardiogram 5. Chemotherapy education class Attempted to contact patient to discuss further, however I was unable to reach her by phone. Brief summary of plan of care shared with patient via [place]. Patient is coming in on Friday, 12/13/2017, to meet he nurse navigator [place], RN). Plans will be reinforced at this time. I will proceed with ordering the aforementioned items in preparation for initiation of systemic neo-adjuvant chemotherapy.    Honor Loh, NP  12/12/2017, 3:15 PM   I saw and evaluated the patient, participating in the key portions of the service and reviewing pertinent diagnostic studies and records.  I reviewed the nurse practitioner's note and agree with the findings and the plan.  The assessment and plan were discussed with the patient. Multiple questions were asked by the patient and answered.   Nolon Stalls, MD 12/12/2017, 3:15 PM

## 2017-12-13 ENCOUNTER — Other Ambulatory Visit: Payer: Self-pay

## 2017-12-13 ENCOUNTER — Encounter: Payer: Self-pay | Admitting: Urgent Care

## 2017-12-13 LAB — CANCER ANTIGEN 27.29: CA 27.29: 24 U/mL (ref 0.0–38.6)

## 2017-12-14 ENCOUNTER — Other Ambulatory Visit: Payer: Self-pay | Admitting: General Surgery

## 2017-12-14 ENCOUNTER — Other Ambulatory Visit: Payer: Self-pay | Admitting: *Deleted

## 2017-12-14 ENCOUNTER — Other Ambulatory Visit: Payer: Self-pay

## 2017-12-14 ENCOUNTER — Encounter: Payer: Self-pay | Admitting: *Deleted

## 2017-12-14 ENCOUNTER — Telehealth: Payer: Self-pay | Admitting: General Surgery

## 2017-12-14 ENCOUNTER — Telehealth: Payer: Self-pay

## 2017-12-14 ENCOUNTER — Encounter
Admission: RE | Admit: 2017-12-14 | Discharge: 2017-12-14 | Disposition: A | Discharge status: Home / Self Care | Payer: BC Managed Care – PPO | Source: Ambulatory Visit | Attending: General Surgery | Admitting: General Surgery

## 2017-12-14 DIAGNOSIS — C50212 Malignant neoplasm of upper-inner quadrant of left female breast: Secondary | ICD-10-CM

## 2017-12-14 DIAGNOSIS — N6322 Unspecified lump in the left breast, upper inner quadrant: Secondary | ICD-10-CM

## 2017-12-14 NOTE — Progress Notes (Signed)
  Oncology Nurse Navigator Documentation  Navigator Location: [facility] (12/14/17 0800)   )Navigator Encounter Type: Telephone (12/14/17 0800)                                                    Time Spent with Patient: 15 (12/14/17 0800)   Left message for patient to return my call.  I would like to schedule her a time to come by today to discuss educational information.

## 2017-12-14 NOTE — Patient Instructions (Signed)
Your procedure is scheduled on: 12/17/17 Report to [facility] at 6 am .  Remember: Instructions that are not followed completely may result in serious medical risk, up to and including death, or upon the discretion of your surgeon and anesthesiologist your surgery may need to be rescheduled.     _X__ 1. Do not eat food after midnight the night before your procedure.                 No gum chewing or hard candies. You may drink clear liquids up to 2 hours                 before you are scheduled to arrive for your surgery- DO not drink clear                 liquids within 2 hours of the start of your surgery.                 Clear Liquids include:  water, apple juice without pulp, clear carbohydrate                 drink such as Clearfast or Gatorade, Black Coffee or Tea (Do not add                 anything to coffee or tea).  __X__2.  On the morning of surgery brush your teeth with toothpaste and water, you                 may rinse your mouth with mouthwash if you wish.  Do not swallow any              toothpaste of mouthwash.     _X__ 3.  No Alcohol for 24 hours before or after surgery.   _X__ 4.  Do Not Smoke or use e-cigarettes For 24 Hours Prior to Your Surgery.                 Do not use any chewable tobacco products for at least 6 hours prior to                 surgery.  ____  5.  Bring all medications with you on the day of surgery if instructed.   __X__  6.  Notify your doctor if there is any change in your medical condition      (cold, fever, infections).     Do not wear jewelry, make-up, hairpins, clips or nail polish. Do not wear lotions, powders, or perfumes.  Do not shave 48 hours prior to surgery. Men may shave face and neck. Do not bring valuables to the hospital.    [facility] is not responsible for any belongings or valuables.  Contacts, dentures/partials or body piercings may not be worn into surgery. Bring a  case for your contacts, glasses or hearing aids, a denture cup will be supplied. Leave your suitcase in the car. After surgery it may be brought to your room. For patients admitted to the hospital, discharge time is determined by your treatment team.   Patients discharged the day of surgery will not be allowed to drive home.   Please read over the following fact sheets that you were given:     __X__ Take these medicines the morning of surgery with A SIP OF WATER:    1.   2.   3.   4.  5.  6.  ____ Fleet Enema (as directed)   __ _ Use CHG Soap/SAGE wipes as directed  ____ Use inhalers on the day of surgery  ____ Stop metformin/Janumet/Farxiga 2 days prior to surgery    ____ Take 1/2 of usual insulin dose the night before surgery. No insulin the morning          of surgery.   ____ Stop Blood Thinners Coumadin/Plavix/Xarelto/Pleta/Pradaxa/Eliquis/Effient/Aspirin  on   Or contact your Surgeon, Cardiologist or Medical Doctor regarding  ability to stop your blood thinners  __X__ Stop Anti-inflammatories 7 days before surgery such as Advil, Ibuprofen, Motrin,  BC or Goodies Powder, Naprosyn, Naproxen, Aleve, Aspirin    __X__ Stopall herbal supplements, fish oil or vitamin E until after surgery.    ____ Bring C-Pap to the hospital.

## 2017-12-14 NOTE — Telephone Encounter (Signed)
We discussed the results of her recently completed consultation with medical oncology the took place on December 12, 2017.  As she is HER-2/neu positive, plans are for neoadjuvant chemotherapy.  I discussed the possibility of biopsying/removing the papilloma from the right breast previously identified to clear the right breast completely.  She expressed an interest in potentially undergoing bilateral mastectomy and reconstruction.  We had a moderate conversation conversation regarding this point, recognizing that contralateral mastectomy yields no survival advantage.  We will defer action on the small papilloma it has been biopsied in the past and it likely correlates to the MRI area on her recent study until later in the sequence of events when it is closer to the time for surgical intervention on the left side.  The risks associated with central venous access including arterial, pulmonary and venous injury were reviewed. The possible need for additional treatment if pulmonary injury occurs (chest tube placement) was discussed. 

## 2017-12-14 NOTE — Telephone Encounter (Signed)
Call to patient about setting up surgery. The patient is scheduled for surgery at [facility] on 12/17/17. She will pre admit by phone on 12/14/17. The patient is aware of dates and instructions.

## 2017-12-14 NOTE — Telephone Encounter (Signed)
-----   Message from [name], MD sent at 12/14/2017 10:24 AM EDT ----- Please call patient and arrange time for a right power placement. Thanks.

## 2017-12-14 NOTE — Progress Notes (Signed)
  Oncology Nurse Navigator Documentation  Navigator Location: [facility] (12/14/17 1100)   )Navigator Encounter Type: Clinic/MDC (12/14/17 1100)                     Patient Visit Type: Initial (12/14/17 1100)   Barriers/Navigation Needs: Education (12/14/17 1100) Education: Newly Diagnosed Cancer Education (12/14/17 1100) Interventions: Education (12/14/17 1100)     Education Method: Verbal;Written (12/14/17 1100)  Support Groups/Services: [facility];Breast Support Group (12/14/17 1100) Specialty Items/DME: Wigs (12/14/17 1100)           Time Spent with Patient: 60 (12/14/17 1100)   Met with patient today to review educational literature.  Gave patient breast cancer educational literature, "My Breast Cancer Treatment Handbook" by Josephine Igo, RN.  Reviewed treatment plan and next appointments.  Hand-out with plan and appointments given to patient.  Patient is very tearful today.  Offered support.  Referred to breast cancer support group, counseling services, [facility] and [place].  Patient is to call with any questions or needs.

## 2017-12-16 ENCOUNTER — Encounter: Payer: Self-pay | Admitting: Hematology and Oncology

## 2017-12-16 ENCOUNTER — Other Ambulatory Visit: Payer: Self-pay | Admitting: Hematology and Oncology

## 2017-12-16 DIAGNOSIS — C50412 Malignant neoplasm of upper-outer quadrant of left female breast: Secondary | ICD-10-CM

## 2017-12-16 DIAGNOSIS — Z170 Estrogen receptor positive status [ER+]: Principal | ICD-10-CM

## 2017-12-16 NOTE — Progress Notes (Signed)
START ON PATHWAY REGIMEN - Breast     A cycle is every 21 days:     Pertuzumab      Pertuzumab      Trastuzumab      Trastuzumab      Carboplatin      Docetaxel   **Always confirm dose/schedule in your pharmacy ordering system**  Patient Characteristics: Preoperative or Nonsurgical Candidate (Clinical Staging), Neoadjuvant Therapy followed by Surgery, Invasive Disease, Chemotherapy, HER2 Positive, ER Positive Therapeutic Status: Preoperative or Nonsurgical Candidate (Clinical Staging) AJCC M Category: cM0 AJCC Grade: GX Breast Surgical Plan: Neoadjuvant Therapy followed by Surgery ER Status: Positive (+) AJCC 8 Stage Grouping: Unknown HER2 Status: Positive (+) AJCC T Category: cT1c AJCC N Category: cN1 PR Status: Positive (+) Intent of Therapy: Curative Intent, Discussed with Patient

## 2017-12-17 ENCOUNTER — Telehealth: Payer: Self-pay | Admitting: *Deleted

## 2017-12-17 ENCOUNTER — Ambulatory Visit: Payer: BC Managed Care – PPO

## 2017-12-17 ENCOUNTER — Ambulatory Visit
Admission: RE | Admit: 2017-12-17 | Discharge: 2017-12-17 | Disposition: A | Discharge status: Home / Self Care | Payer: BC Managed Care – PPO | Source: Ambulatory Visit | Attending: General Surgery | Admitting: General Surgery

## 2017-12-17 ENCOUNTER — Encounter: Payer: Self-pay | Admitting: *Deleted

## 2017-12-17 ENCOUNTER — Encounter
Admission: RE | Disposition: A | Discharge status: Home / Self Care | Payer: Self-pay | Source: Ambulatory Visit | Attending: General Surgery

## 2017-12-17 ENCOUNTER — Ambulatory Visit: Payer: BC Managed Care – PPO | Admitting: Anesthesiology

## 2017-12-17 ENCOUNTER — Other Ambulatory Visit: Payer: Self-pay

## 2017-12-17 DIAGNOSIS — C50212 Malignant neoplasm of upper-inner quadrant of left female breast: Secondary | ICD-10-CM

## 2017-12-17 DIAGNOSIS — Z170 Estrogen receptor positive status [ER+]: Secondary | ICD-10-CM | POA: Insufficient documentation

## 2017-12-17 DIAGNOSIS — Z793 Long term (current) use of hormonal contraceptives: Secondary | ICD-10-CM | POA: Insufficient documentation

## 2017-12-17 DIAGNOSIS — Z8041 Family history of malignant neoplasm of ovary: Secondary | ICD-10-CM | POA: Diagnosis not present

## 2017-12-17 DIAGNOSIS — C773 Secondary and unspecified malignant neoplasm of axilla and upper limb lymph nodes: Secondary | ICD-10-CM | POA: Insufficient documentation

## 2017-12-17 DIAGNOSIS — Z803 Family history of malignant neoplasm of breast: Secondary | ICD-10-CM | POA: Diagnosis not present

## 2017-12-17 DIAGNOSIS — Z8042 Family history of malignant neoplasm of prostate: Secondary | ICD-10-CM | POA: Insufficient documentation

## 2017-12-17 DIAGNOSIS — Z95828 Presence of other vascular implants and grafts: Secondary | ICD-10-CM

## 2017-12-17 DIAGNOSIS — Z1501 Genetic susceptibility to malignant neoplasm of breast: Secondary | ICD-10-CM | POA: Diagnosis not present

## 2017-12-17 HISTORY — PX: PORTACATH PLACEMENT: SHX2246

## 2017-12-17 LAB — POCT PREGNANCY, URINE: PREG TEST UR: NEGATIVE

## 2017-12-17 SURGERY — INSERTION, TUNNELED CENTRAL VENOUS DEVICE, WITH PORT
Anesthesia: Monitor Anesthesia Care | Laterality: Right | Wound class: Clean

## 2017-12-17 MED ORDER — CEFAZOLIN SODIUM-DEXTROSE 2-4 GM/100ML-% IV SOLN
INTRAVENOUS | Status: AC
  Filled 2017-12-17: qty 100

## 2017-12-17 MED ORDER — SODIUM CHLORIDE 0.9 % IJ SOLN
INTRAMUSCULAR | Status: DC | PRN
  Administered 2017-12-17: 30 mL via INTRAVENOUS

## 2017-12-17 MED ORDER — FENTANYL CITRATE (PF) 100 MCG/2ML IJ SOLN: 25.0000 ug | INTRAMUSCULAR | Status: DC | PRN

## 2017-12-17 MED ORDER — PROPOFOL 500 MG/50ML IV EMUL
INTRAVENOUS | Status: AC
  Filled 2017-12-17: qty 50

## 2017-12-17 MED ORDER — HYDROCODONE-ACETAMINOPHEN 5-325 MG PO TABS
1.0000 | ORAL_TABLET | ORAL | Status: DC | PRN
  Administered 2017-12-17: 1 via ORAL

## 2017-12-17 MED ORDER — DEXAMETHASONE SODIUM PHOSPHATE 10 MG/ML IJ SOLN
INTRAMUSCULAR | Status: DC | PRN
  Administered 2017-12-17: 10 mg via INTRAVENOUS

## 2017-12-17 MED ORDER — LIDOCAINE HCL 1 % IJ SOLN
INTRAMUSCULAR | Status: DC | PRN
  Administered 2017-12-17: 10 mL

## 2017-12-17 MED ORDER — PROPOFOL 10 MG/ML IV BOLUS
INTRAVENOUS | Status: DC | PRN
  Administered 2017-12-17: 200 mg via INTRAVENOUS

## 2017-12-17 MED ORDER — ONDANSETRON HCL 4 MG/2ML IJ SOLN: 4.0000 mg | Freq: Once | INTRAMUSCULAR | Status: DC | PRN

## 2017-12-17 MED ORDER — SODIUM CHLORIDE FLUSH 0.9 % IV SOLN
INTRAVENOUS | Status: AC
  Filled 2017-12-17: qty 20

## 2017-12-17 MED ORDER — CEFAZOLIN SODIUM-DEXTROSE 2-4 GM/100ML-% IV SOLN
2.0000 g | INTRAVENOUS | Status: AC
  Administered 2017-12-17: 2 g via INTRAVENOUS

## 2017-12-17 MED ORDER — LIDOCAINE HCL (CARDIAC) PF 100 MG/5ML IV SOSY
PREFILLED_SYRINGE | INTRAVENOUS | Status: DC | PRN
  Administered 2017-12-17: 80 mg via INTRAVENOUS

## 2017-12-17 MED ORDER — MIDAZOLAM HCL 2 MG/2ML IJ SOLN
INTRAMUSCULAR | Status: AC
  Filled 2017-12-17: qty 2

## 2017-12-17 MED ORDER — LACTATED RINGERS IV SOLN
INTRAVENOUS | Status: DC
  Administered 2017-12-17 (×2): via INTRAVENOUS

## 2017-12-17 MED ORDER — FAMOTIDINE 20 MG PO TABS
ORAL_TABLET | ORAL | Status: AC
  Administered 2017-12-17: 20 mg via ORAL
  Filled 2017-12-17: qty 1

## 2017-12-17 MED ORDER — SODIUM CHLORIDE FLUSH 0.9 % IV SOLN
INTRAVENOUS | Status: AC
  Filled 2017-12-17: qty 30

## 2017-12-17 MED ORDER — FENTANYL CITRATE (PF) 100 MCG/2ML IJ SOLN
INTRAMUSCULAR | Status: DC | PRN
  Administered 2017-12-17: 50 ug via INTRAVENOUS

## 2017-12-17 MED ORDER — ONDANSETRON HCL 4 MG/2ML IJ SOLN
INTRAMUSCULAR | Status: DC | PRN
  Administered 2017-12-17: 4 mg via INTRAVENOUS

## 2017-12-17 MED ORDER — LIDOCAINE HCL (PF) 1 % IJ SOLN
INTRAMUSCULAR | Status: AC
  Filled 2017-12-17: qty 30

## 2017-12-17 MED ORDER — MIDAZOLAM HCL 2 MG/2ML IJ SOLN
INTRAMUSCULAR | Status: DC | PRN
  Administered 2017-12-17: 2 mg via INTRAVENOUS

## 2017-12-17 MED ORDER — HYDROCODONE-ACETAMINOPHEN 5-325 MG PO TABS
ORAL_TABLET | ORAL | Status: AC
  Filled 2017-12-17: qty 1

## 2017-12-17 MED ORDER — PROPOFOL 10 MG/ML IV BOLUS
INTRAVENOUS | Status: AC
  Filled 2017-12-17: qty 20

## 2017-12-17 MED ORDER — FAMOTIDINE 20 MG PO TABS
20.0000 mg | ORAL_TABLET | Freq: Once | ORAL | Status: AC
  Administered 2017-12-17: 20 mg via ORAL

## 2017-12-17 MED ORDER — FENTANYL CITRATE (PF) 100 MCG/2ML IJ SOLN
INTRAMUSCULAR | Status: AC
  Filled 2017-12-17: qty 2

## 2017-12-17 MED ORDER — HYDROCODONE-ACETAMINOPHEN 5-325 MG PO TABS: 1.0000 | ORAL_TABLET | ORAL | 0 refills | Status: DC | PRN

## 2017-12-17 SURGICAL SUPPLY — 30 items
BLADE SURG 15 STRL SS SAFETY (BLADE) ×3 IMPLANT
CHLORAPREP W/TINT 26ML (MISCELLANEOUS) ×3 IMPLANT
CLOSURE WOUND 1/2 X4 (GAUZE/BANDAGES/DRESSINGS) ×1
COVER LIGHT HANDLE STERIS (MISCELLANEOUS) ×6 IMPLANT
DECANTER SPIKE VIAL GLASS SM (MISCELLANEOUS) ×6 IMPLANT
DRAPE C-ARM XRAY 36X54 (DRAPES) ×3 IMPLANT
DRAPE LAPAROTOMY TRNSV 106X77 (MISCELLANEOUS) ×3 IMPLANT
DRSG TEGADERM 2-3/8X2-3/4 SM (GAUZE/BANDAGES/DRESSINGS) ×3 IMPLANT
DRSG TEGADERM 4X4.75 (GAUZE/BANDAGES/DRESSINGS) ×3 IMPLANT
DRSG TELFA 3X8 NADH (GAUZE/BANDAGES/DRESSINGS) ×3 IMPLANT
DRSG TELFA 4X3 1S NADH ST (GAUZE/BANDAGES/DRESSINGS) ×3 IMPLANT
ELECT REM PT RETURN 9FT ADLT (ELECTROSURGICAL) ×3
ELECTRODE REM PT RTRN 9FT ADLT (ELECTROSURGICAL) ×1 IMPLANT
GLOVE BIO SURGEON STRL SZ7.5 (GLOVE) ×3 IMPLANT
GLOVE INDICATOR 8.0 STRL GRN (GLOVE) ×3 IMPLANT
GOWN STRL REUS W/ TWL LRG LVL3 (GOWN DISPOSABLE) ×2 IMPLANT
GOWN STRL REUS W/TWL LRG LVL3 (GOWN DISPOSABLE) ×4
KIT PORT POWER 8FR ISP CVUE (Port) ×3 IMPLANT
KIT TURNOVER KIT A (KITS) ×3 IMPLANT
LABEL OR SOLS (LABEL) ×3 IMPLANT
NS IRRIG 500ML POUR BTL (IV SOLUTION) ×3 IMPLANT
PACK PORT-A-CATH (MISCELLANEOUS) ×3 IMPLANT
STRIP CLOSURE SKIN 1/2X4 (GAUZE/BANDAGES/DRESSINGS) ×2 IMPLANT
SUT PROLENE 3 0 SH DA (SUTURE) ×3 IMPLANT
SUT VIC AB 3-0 SH 27 (SUTURE) ×2
SUT VIC AB 3-0 SH 27X BRD (SUTURE) ×1 IMPLANT
SUT VIC AB 4-0 FS2 27 (SUTURE) ×3 IMPLANT
SWABSTK COMLB BENZOIN TINCTURE (MISCELLANEOUS) ×3 IMPLANT
SYR 10ML LL (SYRINGE) ×3 IMPLANT
SYR 10ML SLIP (SYRINGE) ×3 IMPLANT

## 2017-12-17 NOTE — Op Note (Signed)
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  Postoperative diagnosis: Same.  Operative procedure: Right subclavian PowerPort placement with ultrasound and fluoroscopic guidance.  Operating Surgeon: Hervey Ard, MD.  Anesthesia: LMA per anesthesia, 10 cc 1% plain Xylocaine.  Estimated blood loss: 0.  Clinical note: This 42 year old woman was recently diagnosed with breast cancer, HER-2/neu positive.  She is a candidate for adjuvant chemotherapy.  Central venous access was requested by her treating oncologist.  Operative note: With the patient under adequate general anesthesia the area of the right chest and neck was prepped with ChloraPrep and draped.  Ultrasound was used to confirm patency of the subclavian vein.  Local anesthesia was infiltrated.  The vein was cannulated under single stick under ultrasound guidance.  The guidewire was advanced followed by the dilator and catheter placement.  The catheter was tunneled to the right anterior chest where a pocket had been created.  The port was anchored to the deep tissue with 3-0 Prolene sutures x2.  The wound was closed with a running 3-0 Vicryl to the adipose layer and a running 4-0 Vicryl subcuticular suture for the skin.  The port was accessed and easily irrigated and aspirated in the supine position.  It was flushed with 10 cc of injectable saline.  Benzoin, Steri-Strips, Telfa and Tegaderm dressings were applied.  The patient tolerated the procedure well was taken recovery room in stable condition.  Portable chest x-ray in the recovery room showed no evidence pneumothorax and the catheter tip in the SVC/right atrial area.

## 2017-12-17 NOTE — Anesthesia Post-op Follow-up Note (Signed)
Anesthesia QCDR form completed.        

## 2017-12-17 NOTE — Discharge Instructions (Signed)

## 2017-12-17 NOTE — Anesthesia Procedure Notes (Signed)
Procedure Name: LMA Insertion Date/Time: 12/17/2017 7:38 AM Performed by: Justus Memory, CRNA Pre-anesthesia Checklist: Patient identified, Patient being monitored, Timeout performed, Emergency Drugs available and Suction available Patient Re-evaluated:Patient Re-evaluated prior to induction Oxygen Delivery Method: Circle system utilized Preoxygenation: Pre-oxygenation with 100% oxygen Induction Type: IV induction Ventilation: Mask ventilation without difficulty LMA: LMA inserted LMA Size: 3.5 Tube type: Oral Number of attempts: 1 Placement Confirmation: positive ETCO2 and breath sounds checked- equal and bilateral Tube secured with: Tape Dental Injury: Teeth and Oropharynx as per pre-operative assessment

## 2017-12-17 NOTE — OR Nursing (Signed)
[name] in to see pt postop, advises ok to d/c to home.

## 2017-12-17 NOTE — Telephone Encounter (Signed)
PET was cancelled as requested by [facility]. I called patient and make her aware that the PET scan was cancelled.

## 2017-12-17 NOTE — Anesthesia Preprocedure Evaluation (Signed)
Anesthesia Evaluation  Patient identified by MRN, date of birth, ID band Patient awake    Reviewed: Allergy & Precautions, H&P , NPO status , Patient's Chart, lab work & pertinent test results  History of Anesthesia Complications Negative for: history of anesthetic complications  Airway Mallampati: II  TM Distance: >3 FB Neck ROM: full    Dental no notable dental hx. (+) Teeth Intact   Pulmonary neg pulmonary ROS, neg shortness of breath,    Pulmonary exam normal breath sounds clear to auscultation       Cardiovascular Exercise Tolerance: Good (-) Past MI negative cardio ROS Normal cardiovascular exam Rhythm:regular Rate:Normal     Neuro/Psych negative neurological ROS  negative psych ROS   GI/Hepatic negative GI ROS, Neg liver ROS, neg GERD  ,  Endo/Other  negative endocrine ROS  Renal/GU negative Renal ROS  negative genitourinary   Musculoskeletal   Abdominal   Peds  Hematology negative hematology ROS (+)   Anesthesia Other Findings History of breast cancer.   Reproductive/Obstetrics negative OB ROS                             Anesthesia Physical  Anesthesia Plan  ASA: II  Anesthesia Plan: MAC and General   Post-op Pain Management:    Induction:   PONV Risk Score and Plan: TIVA  Airway Management Planned: Nasal Cannula  Additional Equipment:   Intra-op Plan:   Post-operative Plan:   Informed Consent: I have reviewed the patients History and Physical, chart, labs and discussed the procedure including the risks, benefits and alternatives for the proposed anesthesia with the patient or authorized representative who has indicated his/her understanding and acceptance.   Dental Advisory Given  Plan Discussed with: Anesthesiologist, CRNA and Surgeon  Anesthesia Plan Comments:         Anesthesia Quick Evaluation

## 2017-12-17 NOTE — Transfer of Care (Addendum)
Immediate Anesthesia Transfer of Care Note  Patient: Denise [name]  Procedure(s) Performed: INSERTION PORT-A-CATH (Right )  Patient Location: PACU  Anesthesia Type:General  Level of Consciousness: sedated  Airway & Oxygen Therapy: Patient Spontanous Breathing and Patient connected to face mask oxygen  Post-op Assessment: Report given to RN and Post -op Vital signs reviewed and stable  Post vital signs: Reviewed and stable  Last Vitals:  Vitals Value Taken Time  BP 117/73 12/17/2017  8:16 AM  Temp    Pulse 115 12/17/2017  8:23 AM  Resp 17 12/17/2017  8:23 AM  SpO2 98 % 12/17/2017  8:23 AM  Vitals shown include unvalidated device data.  Last Pain:  Vitals:   12/17/17 0623  PainSc: 0-No pain         Complications: No apparent anesthesia complications

## 2017-12-17 NOTE — H&P (Signed)
No change in clinical history or exam.  For right power port placement.

## 2017-12-18 ENCOUNTER — Ambulatory Visit: Admission: RE | Admit: 2017-12-18 | Payer: BC Managed Care – PPO | Source: Ambulatory Visit

## 2017-12-18 ENCOUNTER — Other Ambulatory Visit: Payer: Self-pay | Admitting: Hematology and Oncology

## 2017-12-18 ENCOUNTER — Telehealth: Payer: Self-pay | Admitting: *Deleted

## 2017-12-18 ENCOUNTER — Encounter: Payer: Self-pay | Admitting: General Surgery

## 2017-12-18 ENCOUNTER — Other Ambulatory Visit: Payer: Self-pay | Admitting: Urgent Care

## 2017-12-18 ENCOUNTER — Encounter: Payer: Self-pay | Admitting: *Deleted

## 2017-12-18 ENCOUNTER — Ambulatory Visit (HOSPITAL_COMMUNITY): Admission: RE | Admit: 2017-12-18 | Payer: BC Managed Care – PPO | Source: Ambulatory Visit

## 2017-12-18 DIAGNOSIS — Z006 Encounter for examination for normal comparison and control in clinical research program: Secondary | ICD-10-CM

## 2017-12-18 DIAGNOSIS — Z171 Estrogen receptor negative status [ER-]: Principal | ICD-10-CM

## 2017-12-18 DIAGNOSIS — C50412 Malignant neoplasm of upper-outer quadrant of left female breast: Secondary | ICD-10-CM

## 2017-12-18 NOTE — Addendum Note (Signed)
Addended by: Honor Loh on: 12/18/2017 03:09 PM   Modules accepted: Orders

## 2017-12-18 NOTE — Anesthesia Postprocedure Evaluation (Signed)
Anesthesia Post Note  Patient: Denise [name]  Procedure(s) Performed: INSERTION PORT-A-CATH (Right )  Patient location during evaluation: PACU Anesthesia Type: General Level of consciousness: awake and alert and oriented Pain management: pain level controlled Vital Signs Assessment: post-procedure vital signs reviewed and stable Respiratory status: spontaneous breathing Cardiovascular status: blood pressure returned to baseline Anesthetic complications: no     Last Vitals:  Vitals:   12/17/17 0858 12/17/17 0951  BP: 129/90 129/85  Pulse: 93 84  Resp: 14 14  Temp: 36.4 C   SpO2: 100% 99%    Last Pain:  Vitals:   12/17/17 0951  TempSrc:   PainSc: 6                  Aasha Dina

## 2017-12-18 NOTE — Progress Notes (Signed)
CT imaging of the chest, abdomen, and pelvis approved. Scheduling made aware. Will see if we can get imaging done STAT in order to determine immediate need for subsequent PET imaging prior to proceeding with treatment as planned on 12/21/2017.   Patient has been mae aware that CTs have been approved, and that we are in the process of scheduling now. Primary oncologist Mike Gip) updated.   Honor Loh, MSN, APRN, FNP-C, CEN Oncology/Hematology Nurse Practitioner  [facility] 12/18/17, 3:09 PM

## 2017-12-18 NOTE — Telephone Encounter (Signed)
Called patient to inform her that she is scheduled for CT of chest, abdomen and pelvis tomorrow morning (Wednesday, July 3) @ 8:00 am @ [facility] on [address].  Patient instructed to pick up contrast this afternoon @ [facility] before 5 PM.  NPO after midnight.  After her CT, she can eat and come to chemo class @ 9 AM.  Patient verbalized understanding and repeated instructions back to me.

## 2017-12-18 NOTE — Anesthesia Postprocedure Evaluation (Deleted)
Anesthesia Post Note  Patient: Denise [name]  Procedure(s) Performed: INSERTION PORT-A-CATH (Right )  Patient location during evaluation: PACU Anesthesia Type: MAC Level of consciousness: awake and alert and oriented Pain management: pain level controlled Vital Signs Assessment: post-procedure vital signs reviewed and stable Respiratory status: spontaneous breathing Cardiovascular status: blood pressure returned to baseline Anesthetic complications: no     Last Vitals:  Vitals:   12/17/17 0858 12/17/17 0951  BP: 129/90 129/85  Pulse: 93 84  Resp: 14 14  Temp: 36.4 C   SpO2: 100% 99%    Last Pain:  Vitals:   12/17/17 0951  TempSrc:   PainSc: 6                  Mariany Mackintosh

## 2017-12-18 NOTE — Progress Notes (Signed)
PET scan denied by insurance. They cited the need for standard imaging prior to authorization on PET imaging.   Insurance made aware of:  Patient age  Triple (+) tumor status  (+) lymph node biopsy  Concern for possible subpectoral lymph node involvement  Discussions from  multidisciplinary tumor board indicating need for PET imaging.   Despite the updated clinical information provided during P2P, the study was denied and we were advised to obtain standard imaging of the chest, abdomen, and pelvis.   Dr. Mike Gip and financials update. Orders placed for the CT scans.  Honor Loh, MSN, APRN, FNP-C, CEN Oncology/Hematology Nurse Practitioner  [facility] 12/18/17, 12:36 PM

## 2017-12-19 ENCOUNTER — Other Ambulatory Visit: Payer: Self-pay | Admitting: Urgent Care

## 2017-12-19 ENCOUNTER — Ambulatory Visit
Admission: RE | Admit: 2017-12-19 | Discharge: 2017-12-19 | Disposition: A | Discharge status: Home / Self Care | Payer: BC Managed Care – PPO | Source: Ambulatory Visit | Attending: Urgent Care | Admitting: Urgent Care

## 2017-12-19 ENCOUNTER — Other Ambulatory Visit: Payer: Self-pay | Admitting: Hematology and Oncology

## 2017-12-19 ENCOUNTER — Inpatient Hospital Stay: Payer: BC Managed Care – PPO | Attending: Hematology and Oncology

## 2017-12-19 ENCOUNTER — Ambulatory Visit: Payer: BC Managed Care – PPO

## 2017-12-19 DIAGNOSIS — Z170 Estrogen receptor positive status [ER+]: Principal | ICD-10-CM

## 2017-12-19 DIAGNOSIS — Z5112 Encounter for antineoplastic immunotherapy: Secondary | ICD-10-CM | POA: Insufficient documentation

## 2017-12-19 DIAGNOSIS — C50412 Malignant neoplasm of upper-outer quadrant of left female breast: Secondary | ICD-10-CM

## 2017-12-19 DIAGNOSIS — M899 Disorder of bone, unspecified: Secondary | ICD-10-CM | POA: Insufficient documentation

## 2017-12-19 DIAGNOSIS — R110 Nausea: Secondary | ICD-10-CM | POA: Insufficient documentation

## 2017-12-19 DIAGNOSIS — Z5111 Encounter for antineoplastic chemotherapy: Secondary | ICD-10-CM | POA: Insufficient documentation

## 2017-12-19 DIAGNOSIS — R197 Diarrhea, unspecified: Secondary | ICD-10-CM | POA: Insufficient documentation

## 2017-12-19 DIAGNOSIS — Z171 Estrogen receptor negative status [ER-]: Secondary | ICD-10-CM

## 2017-12-19 DIAGNOSIS — R51 Headache: Secondary | ICD-10-CM | POA: Insufficient documentation

## 2017-12-19 DIAGNOSIS — C773 Secondary and unspecified malignant neoplasm of axilla and upper limb lymph nodes: Secondary | ICD-10-CM | POA: Insufficient documentation

## 2017-12-19 DIAGNOSIS — Z5189 Encounter for other specified aftercare: Secondary | ICD-10-CM | POA: Insufficient documentation

## 2017-12-19 DIAGNOSIS — Z1501 Genetic susceptibility to malignant neoplasm of breast: Secondary | ICD-10-CM | POA: Insufficient documentation

## 2017-12-19 DIAGNOSIS — B370 Candidal stomatitis: Secondary | ICD-10-CM | POA: Insufficient documentation

## 2017-12-19 MED ORDER — PROCHLORPERAZINE MALEATE 10 MG PO TABS: 10.0000 mg | ORAL_TABLET | Freq: Four times a day (QID) | ORAL | 1 refills | Status: DC | PRN

## 2017-12-19 MED ORDER — IOHEXOL 300 MG/ML  SOLN
100.0000 mL | Freq: Once | INTRAMUSCULAR | Status: AC | PRN
  Administered 2017-12-19: 100 mL via INTRAVENOUS

## 2017-12-19 MED ORDER — ONDANSETRON HCL 8 MG PO TABS: 8.0000 mg | ORAL_TABLET | Freq: Two times a day (BID) | ORAL | 1 refills | Status: DC | PRN

## 2017-12-19 MED ORDER — LIDOCAINE-PRILOCAINE 2.5-2.5 % EX CREA
TOPICAL_CREAM | CUTANEOUS | 3 refills | Status: DC
Start: 2017-12-19 — End: 2018-05-07

## 2017-12-19 NOTE — Patient Instructions (Signed)
Trastuzumab injection for infusion What is this medicine? TRASTUZUMAB (tras TOO zoo mab) is a monoclonal antibody. It is used to treat breast cancer and stomach cancer. This medicine may be used for other purposes; ask your health care provider or pharmacist if you have questions. COMMON BRAND NAME(S): Herceptin What should I tell my health care provider before I take this medicine? They need to know if you have any of these conditions: -heart disease -heart failure -lung or breathing disease, like asthma -an unusual or allergic reaction to trastuzumab, benzyl alcohol, or other medications, foods, dyes, or preservatives -pregnant or trying to get pregnant -breast-feeding How should I use this medicine? This drug is given as an infusion into a vein. It is administered in a hospital or clinic by a specially trained health care professional. Talk to your pediatrician regarding the use of this medicine in children. This medicine is not approved for use in children. Overdosage: If you think you have taken too much of this medicine contact a poison control center or emergency room at once. NOTE: This medicine is only for you. Do not share this medicine with others. What if I miss a dose? It is important not to miss a dose. Call your doctor or health care professional if you are unable to keep an appointment. What may interact with this medicine? This medicine may interact with the following medications: -certain types of chemotherapy, such as daunorubicin, doxorubicin, epirubicin, and idarubicin This list may not describe all possible interactions. Give your health care provider a list of all the medicines, herbs, non-prescription drugs, or dietary supplements you use. Also tell them if you smoke, drink alcohol, or use illegal drugs. Some items may interact with your medicine. What should I watch for while using this medicine? Visit your doctor for checks on your progress. Report any side effects.  Continue your course of treatment even though you feel ill unless your doctor tells you to stop. Call your doctor or health care professional for advice if you get a fever, chills or sore throat, or other symptoms of a cold or flu. Do not treat yourself. Try to avoid being around people who are sick. You may experience fever, chills and shaking during your first infusion. These effects are usually mild and can be treated with other medicines. Report any side effects during the infusion to your health care professional. Fever and chills usually do not happen with later infusions. Do not become pregnant while taking this medicine or for 7 months after stopping it. Women should inform their doctor if they wish to become pregnant or think they might be pregnant. Women of child-bearing potential will need to have a negative pregnancy test before starting this medicine. There is a potential for serious side effects to an unborn child. Talk to your health care professional or pharmacist for more information. Do not breast-feed an infant while taking this medicine or for 7 months after stopping it. Women must use effective birth control with this medicine. What side effects may I notice from receiving this medicine? Side effects that you should report to your doctor or health care professional as soon as possible: -allergic reactions like skin rash, itching or hives, swelling of the face, lips, or tongue -chest pain or palpitations -cough -dizziness -feeling faint or lightheaded, falls -fever -general ill feeling or flu-like symptoms -signs of worsening heart failure like breathing problems; swelling in your legs and feet -unusually weak or tired Side effects that usually do not require medical   attention (report to your doctor or health care professional if they continue or are bothersome): -bone pain -changes in taste -diarrhea -joint pain -nausea/vomiting -weight loss This list may not describe all  possible side effects. Call your doctor for medical advice about side effects. You may report side effects to FDA at 1-800-FDA-1088. Where should I keep my medicine? This drug is given in a hospital or clinic and will not be stored at home. NOTE: This sheet is a summary. It may not cover all possible information. If you have questions about this medicine, talk to your doctor, pharmacist, or health care provider.  2018 [employer] Standard (2016-05-30 14:37:52) Pertuzumab injection What is this medicine? PERTUZUMAB (per TOOZ ue mab) is a monoclonal antibody. It is used to treat breast cancer. This medicine may be used for other purposes; ask your health care provider or pharmacist if you have questions. COMMON BRAND NAME(S): PERJETA What should I tell my health care provider before I take this medicine? They need to know if you have any of these conditions: -heart disease -heart failure -high blood pressure -history of irregular heart beat -recent or ongoing radiation therapy -an unusual or allergic reaction to pertuzumab, other medicines, foods, dyes, or preservatives -pregnant or trying to get pregnant -breast-feeding How should I use this medicine? This medicine is for infusion into a vein. It is given by a health care professional in a hospital or clinic setting. Talk to your pediatrician regarding the use of this medicine in children. Special care may be needed. Overdosage: If you think you have taken too much of this medicine contact a poison control center or emergency room at once. NOTE: This medicine is only for you. Do not share this medicine with others. What if I miss a dose? It is important not to miss your dose. Call your doctor or health care professional if you are unable to keep an appointment. What may interact with this medicine? Interactions are not expected. Give your health care provider a list of all the medicines, herbs, non-prescription drugs, or dietary  supplements you use. Also tell them if you smoke, drink alcohol, or use illegal drugs. Some items may interact with your medicine. This list may not describe all possible interactions. Give your health care provider a list of all the medicines, herbs, non-prescription drugs, or dietary supplements you use. Also tell them if you smoke, drink alcohol, or use illegal drugs. Some items may interact with your medicine. What should I watch for while using this medicine? Your condition will be monitored carefully while you are receiving this medicine. Report any side effects. Continue your course of treatment even though you feel ill unless your doctor tells you to stop. Do not become pregnant while taking this medicine or for 7 months after stopping it. Women should inform their doctor if they wish to become pregnant or think they might be pregnant. Women of child-bearing potential will need to have a negative pregnancy test before starting this medicine. There is a potential for serious side effects to an unborn child. Talk to your health care professional or pharmacist for more information. Do not breast-feed an infant while taking this medicine or for 7 months after stopping it. Women must use effective birth control with this medicine. Call your doctor or health care professional for advice if you get a fever, chills or sore throat, or other symptoms of a cold or flu. Do not treat yourself. Try to avoid being around people who are sick. You  may experience fever, chills, and headache during the infusion. Report any side effects during the infusion to your health care professional. What side effects may I notice from receiving this medicine? Side effects that you should report to your doctor or health care professional as soon as possible: -breathing problems -chest pain or palpitations -dizziness -feeling faint or lightheaded -fever or chills -skin rash, itching or hives -sore throat -swelling of the  face, lips, or tongue -swelling of the legs or ankles -unusually weak or tired Side effects that usually do not require medical attention (report to your doctor or health care professional if they continue or are bothersome): -diarrhea -hair loss -nausea, vomiting -tiredness This list may not describe all possible side effects. Call your doctor for medical advice about side effects. You may report side effects to FDA at 1-800-FDA-1088. Where should I keep my medicine? This drug is given in a hospital or clinic and will not be stored at home. NOTE: This sheet is a summary. It may not cover all possible information. If you have questions about this medicine, talk to your doctor, pharmacist, or health care provider.  2018 [employer] Standard (2015-07-08 12:08:50) Docetaxel injection What is this medicine? DOCETAXEL (doe se TAX el) is a chemotherapy drug. It targets fast dividing cells, like cancer cells, and causes these cells to die. This medicine is used to treat many types of cancers like breast cancer, certain stomach cancers, head and neck cancer, lung cancer, and prostate cancer. This medicine may be used for other purposes; ask your health care provider or pharmacist if you have questions. COMMON BRAND NAME(S): Docefrez, Taxotere What should I tell my health care provider before I take this medicine? They need to know if you have any of these conditions: -infection (especially a virus infection such as chickenpox, cold sores, or herpes) -liver disease -low blood counts, like low white cell, platelet, or red cell counts -an unusual or allergic reaction to docetaxel, polysorbate 80, other chemotherapy agents, other medicines, foods, dyes, or preservatives -pregnant or trying to get pregnant -breast-feeding How should I use this medicine? This drug is given as an infusion into a vein. It is administered in a hospital or clinic by a specially trained health care professional. Talk to  your pediatrician regarding the use of this medicine in children. Special care may be needed. Overdosage: If you think you have taken too much of this medicine contact a poison control center or emergency room at once. NOTE: This medicine is only for you. Do not share this medicine with others. What if I miss a dose? It is important not to miss your dose. Call your doctor or health care professional if you are unable to keep an appointment. What may interact with this medicine? -cyclosporine -erythromycin -ketoconazole -medicines to increase blood counts like filgrastim, pegfilgrastim, sargramostim -vaccines Talk to your doctor or health care professional before taking any of these medicines: -acetaminophen -aspirin -ibuprofen -ketoprofen -naproxen This list may not describe all possible interactions. Give your health care provider a list of all the medicines, herbs, non-prescription drugs, or dietary supplements you use. Also tell them if you smoke, drink alcohol, or use illegal drugs. Some items may interact with your medicine. What should I watch for while using this medicine? Your condition will be monitored carefully while you are receiving this medicine. You will need important blood work done while you are taking this medicine. This drug may make you feel generally unwell. This is not uncommon, as chemotherapy can  affect healthy cells as well as cancer cells. Report any side effects. Continue your course of treatment even though you feel ill unless your doctor tells you to stop. In some cases, you may be given additional medicines to help with side effects. Follow all directions for their use. Call your doctor or health care professional for advice if you get a fever, chills or sore throat, or other symptoms of a cold or flu. Do not treat yourself. This drug decreases your body's ability to fight infections. Try to avoid being around people who are sick. This medicine may increase your  risk to bruise or bleed. Call your doctor or health care professional if you notice any unusual bleeding. This medicine may contain alcohol in the product. You may get drowsy or dizzy. Do not drive, use machinery, or do anything that needs mental alertness until you know how this medicine affects you. Do not stand or sit up quickly, especially if you are an older patient. This reduces the risk of dizzy or fainting spells. Avoid alcoholic drinks. Do not become pregnant while taking this medicine. Women should inform their doctor if they wish to become pregnant or think they might be pregnant. There is a potential for serious side effects to an unborn child. Talk to your health care professional or pharmacist for more information. Do not breast-feed an infant while taking this medicine. What side effects may I notice from receiving this medicine? Side effects that you should report to your doctor or health care professional as soon as possible: -allergic reactions like skin rash, itching or hives, swelling of the face, lips, or tongue -low blood counts - This drug may decrease the number of white blood cells, red blood cells and platelets. You may be at increased risk for infections and bleeding. -signs of infection - fever or chills, cough, sore throat, pain or difficulty passing urine -signs of decreased platelets or bleeding - bruising, pinpoint red spots on the skin, black, tarry stools, nosebleeds -signs of decreased red blood cells - unusually weak or tired, fainting spells, lightheadedness -breathing problems -fast or irregular heartbeat -low blood pressure -mouth sores -nausea and vomiting -pain, swelling, redness or irritation at the injection site -pain, tingling, numbness in the hands or feet -swelling of the ankle, feet, hands -weight gain Side effects that usually do not require medical attention (report to your doctor or health care professional if they continue or are  bothersome): -bone pain -complete hair loss including hair on your head, underarms, pubic hair, eyebrows, and eyelashes -diarrhea -excessive tearing -changes in the color of fingernails -loosening of the fingernails -nausea -muscle pain -red flush to skin -sweating -weak or tired This list may not describe all possible side effects. Call your doctor for medical advice about side effects. You may report side effects to FDA at 1-800-FDA-1088. Where should I keep my medicine? This drug is given in a hospital or clinic and will not be stored at home. NOTE: This sheet is a summary. It may not cover all possible information. If you have questions about this medicine, talk to your doctor, pharmacist, or health care provider.  2018 [employer] Standard (2015-07-08 12:32:56) Carboplatin injection What is this medicine? CARBOPLATIN (KAR boe pla tin) is a chemotherapy drug. It targets fast dividing cells, like cancer cells, and causes these cells to die. This medicine is used to treat ovarian cancer and many other cancers. This medicine may be used for other purposes; ask your health care provider or pharmacist if  you have questions. COMMON BRAND NAME(S): Paraplatin What should I tell my health care provider before I take this medicine? They need to know if you have any of these conditions: -blood disorders -hearing problems -kidney disease -recent or ongoing radiation therapy -an unusual or allergic reaction to carboplatin, cisplatin, other chemotherapy, other medicines, foods, dyes, or preservatives -pregnant or trying to get pregnant -breast-feeding How should I use this medicine? This drug is usually given as an infusion into a vein. It is administered in a hospital or clinic by a specially trained health care professional. Talk to your pediatrician regarding the use of this medicine in children. Special care may be needed. Overdosage: If you think you have taken too much of this  medicine contact a poison control center or emergency room at once. NOTE: This medicine is only for you. Do not share this medicine with others. What if I miss a dose? It is important not to miss a dose. Call your doctor or health care professional if you are unable to keep an appointment. What may interact with this medicine? -medicines for seizures -medicines to increase blood counts like filgrastim, pegfilgrastim, sargramostim -some antibiotics like amikacin, gentamicin, neomycin, streptomycin, tobramycin -vaccines Talk to your doctor or health care professional before taking any of these medicines: -acetaminophen -aspirin -ibuprofen -ketoprofen -naproxen This list may not describe all possible interactions. Give your health care provider a list of all the medicines, herbs, non-prescription drugs, or dietary supplements you use. Also tell them if you smoke, drink alcohol, or use illegal drugs. Some items may interact with your medicine. What should I watch for while using this medicine? Your condition will be monitored carefully while you are receiving this medicine. You will need important blood work done while you are taking this medicine. This drug may make you feel generally unwell. This is not uncommon, as chemotherapy can affect healthy cells as well as cancer cells. Report any side effects. Continue your course of treatment even though you feel ill unless your doctor tells you to stop. In some cases, you may be given additional medicines to help with side effects. Follow all directions for their use. Call your doctor or health care professional for advice if you get a fever, chills or sore throat, or other symptoms of a cold or flu. Do not treat yourself. This drug decreases your body's ability to fight infections. Try to avoid being around people who are sick. This medicine may increase your risk to bruise or bleed. Call your doctor or health care professional if you notice any  unusual bleeding. Be careful brushing and flossing your teeth or using a toothpick because you may get an infection or bleed more easily. If you have any dental work done, tell your dentist you are receiving this medicine. Avoid taking products that contain aspirin, acetaminophen, ibuprofen, naproxen, or ketoprofen unless instructed by your doctor. These medicines may hide a fever. Do not become pregnant while taking this medicine. Women should inform their doctor if they wish to become pregnant or think they might be pregnant. There is a potential for serious side effects to an unborn child. Talk to your health care professional or pharmacist for more information. Do not breast-feed an infant while taking this medicine. What side effects may I notice from receiving this medicine? Side effects that you should report to your doctor or health care professional as soon as possible: -allergic reactions like skin rash, itching or hives, swelling of the face, lips, or tongue -signs  of infection - fever or chills, cough, sore throat, pain or difficulty passing urine -signs of decreased platelets or bleeding - bruising, pinpoint red spots on the skin, black, tarry stools, nosebleeds -signs of decreased red blood cells - unusually weak or tired, fainting spells, lightheadedness -breathing problems -changes in hearing -changes in vision -chest pain -high blood pressure -low blood counts - This drug may decrease the number of white blood cells, red blood cells and platelets. You may be at increased risk for infections and bleeding. -nausea and vomiting -pain, swelling, redness or irritation at the injection site -pain, tingling, numbness in the hands or feet -problems with balance, talking, walking -trouble passing urine or change in the amount of urine Side effects that usually do not require medical attention (report to your doctor or health care professional if they continue or are bothersome): -hair  loss -loss of appetite -metallic taste in the mouth or changes in taste This list may not describe all possible side effects. Call your doctor for medical advice about side effects. You may report side effects to FDA at 1-800-FDA-1088. Where should I keep my medicine? This drug is given in a hospital or clinic and will not be stored at home. NOTE: This sheet is a summary. It may not cover all possible information. If you have questions about this medicine, talk to your doctor, pharmacist, or health care provider.  2018 [employer] Standard (2007-09-10 14:38:05)

## 2017-12-20 ENCOUNTER — Other Ambulatory Visit: Payer: Self-pay | Admitting: Hematology and Oncology

## 2017-12-20 ENCOUNTER — Ambulatory Visit
Admission: RE | Admit: 2017-12-20 | Discharge: 2017-12-20 | Disposition: A | Discharge status: Home / Self Care | Payer: BC Managed Care – PPO | Source: Ambulatory Visit | Attending: Urgent Care | Admitting: Urgent Care

## 2017-12-20 DIAGNOSIS — I081 Rheumatic disorders of both mitral and tricuspid valves: Secondary | ICD-10-CM | POA: Insufficient documentation

## 2017-12-20 DIAGNOSIS — C50412 Malignant neoplasm of upper-outer quadrant of left female breast: Secondary | ICD-10-CM | POA: Insufficient documentation

## 2017-12-20 DIAGNOSIS — Z170 Estrogen receptor positive status [ER+]: Secondary | ICD-10-CM | POA: Diagnosis not present

## 2017-12-20 MED ORDER — DEXAMETHASONE 4 MG PO TABS: 8.0000 mg | ORAL_TABLET | Freq: Two times a day (BID) | ORAL | 1 refills | Status: DC

## 2017-12-21 ENCOUNTER — Inpatient Hospital Stay: Payer: BC Managed Care – PPO

## 2017-12-21 ENCOUNTER — Encounter: Payer: Self-pay | Admitting: Hematology and Oncology

## 2017-12-21 ENCOUNTER — Inpatient Hospital Stay (HOSPITAL_BASED_OUTPATIENT_CLINIC_OR_DEPARTMENT_OTHER): Payer: BC Managed Care – PPO | Admitting: Hematology and Oncology

## 2017-12-21 VITALS — BP 125/76 | HR 86 | Resp 20

## 2017-12-21 VITALS — BP 142/90 | HR 105 | Temp 97.4°F | Resp 18 | Wt 180.0 lb

## 2017-12-21 DIAGNOSIS — R110 Nausea: Secondary | ICD-10-CM | POA: Diagnosis not present

## 2017-12-21 DIAGNOSIS — C50412 Malignant neoplasm of upper-outer quadrant of left female breast: Secondary | ICD-10-CM

## 2017-12-21 DIAGNOSIS — Z170 Estrogen receptor positive status [ER+]: Secondary | ICD-10-CM

## 2017-12-21 DIAGNOSIS — Z5112 Encounter for antineoplastic immunotherapy: Secondary | ICD-10-CM | POA: Diagnosis not present

## 2017-12-21 DIAGNOSIS — Z1501 Genetic susceptibility to malignant neoplasm of breast: Secondary | ICD-10-CM

## 2017-12-21 DIAGNOSIS — B370 Candidal stomatitis: Secondary | ICD-10-CM | POA: Diagnosis not present

## 2017-12-21 DIAGNOSIS — C773 Secondary and unspecified malignant neoplasm of axilla and upper limb lymph nodes: Secondary | ICD-10-CM | POA: Diagnosis not present

## 2017-12-21 DIAGNOSIS — R51 Headache: Secondary | ICD-10-CM | POA: Diagnosis not present

## 2017-12-21 DIAGNOSIS — Z5111 Encounter for antineoplastic chemotherapy: Secondary | ICD-10-CM | POA: Diagnosis not present

## 2017-12-21 DIAGNOSIS — Z5189 Encounter for other specified aftercare: Secondary | ICD-10-CM | POA: Diagnosis not present

## 2017-12-21 DIAGNOSIS — R197 Diarrhea, unspecified: Secondary | ICD-10-CM | POA: Diagnosis not present

## 2017-12-21 DIAGNOSIS — N6322 Unspecified lump in the left breast, upper inner quadrant: Secondary | ICD-10-CM

## 2017-12-21 LAB — CBC WITH DIFFERENTIAL/PLATELET
Basophils Absolute: 0 10*3/uL (ref 0–0.1)
Basophils Relative: 0 %
Eosinophils Absolute: 0 10*3/uL (ref 0–0.7)
Eosinophils Relative: 0 %
HCT: 40.7 % (ref 35.0–47.0)
Hemoglobin: 14.3 g/dL (ref 12.0–16.0)
Lymphocytes Relative: 5 %
Lymphs Abs: 0.8 10*3/uL — ABNORMAL LOW (ref 1.0–3.6)
MCH: 30 pg (ref 26.0–34.0)
MCHC: 35.3 g/dL (ref 32.0–36.0)
MCV: 85.1 fL (ref 80.0–100.0)
Monocytes Absolute: 0.3 10*3/uL (ref 0.2–0.9)
Monocytes Relative: 2 %
Neutro Abs: 13.3 10*3/uL — ABNORMAL HIGH (ref 1.4–6.5)
Neutrophils Relative %: 93 %
Platelets: 267 10*3/uL (ref 150–440)
RBC: 4.78 MIL/uL (ref 3.80–5.20)
RDW: 13.1 % (ref 11.5–14.5)
WBC: 14.4 10*3/uL — ABNORMAL HIGH (ref 3.6–11.0)

## 2017-12-21 LAB — COMPREHENSIVE METABOLIC PANEL
ALT: 33 U/L (ref 0–44)
AST: 41 U/L (ref 15–41)
Albumin: 4.3 g/dL (ref 3.5–5.0)
Alkaline Phosphatase: 55 U/L (ref 38–126)
Anion gap: 14 (ref 5–15)
BUN: 12 mg/dL (ref 6–20)
CO2: 19 mmol/L — ABNORMAL LOW (ref 22–32)
Calcium: 9.6 mg/dL (ref 8.9–10.3)
Chloride: 103 mmol/L (ref 98–111)
Creatinine, Ser: 0.78 mg/dL (ref 0.44–1.00)
GFR calc Af Amer: >60 mL/min (ref >60)
GFR calc non Af Amer: >60 mL/min (ref >60)
Glucose, Bld: 279 mg/dL — ABNORMAL HIGH (ref 70–99)
Potassium: 3.7 mmol/L (ref 3.5–5.1)
Sodium: 136 mmol/L (ref 135–145)
Total Bilirubin: 0.8 mg/dL (ref 0.3–1.2)
Total Protein: 8.1 g/dL (ref 6.5–8.1)

## 2017-12-21 LAB — PREGNANCY, URINE: Preg Test, Ur: NEGATIVE

## 2017-12-21 LAB — MAGNESIUM: Magnesium: 2 mg/dL (ref 1.7–2.4)

## 2017-12-21 MED ORDER — SODIUM CHLORIDE 0.9 % IV SOLN
840.0000 mg | Freq: Once | INTRAVENOUS | Status: AC
  Administered 2017-12-21: 840 mg via INTRAVENOUS
  Filled 2017-12-21: qty 28

## 2017-12-21 MED ORDER — SODIUM CHLORIDE 0.9% FLUSH
10.0000 mL | Freq: Once | INTRAVENOUS | Status: AC
  Administered 2017-12-21: 10 mL via INTRAVENOUS
  Filled 2017-12-21: qty 10

## 2017-12-21 MED ORDER — PEGFILGRASTIM 6 MG/0.6ML ~~LOC~~ PSKT
6.0000 mg | PREFILLED_SYRINGE | Freq: Once | SUBCUTANEOUS | Status: AC
  Administered 2017-12-21: 6 mg via SUBCUTANEOUS
  Filled 2017-12-21: qty 0.6

## 2017-12-21 MED ORDER — HEPARIN SOD (PORK) LOCK FLUSH 100 UNIT/ML IV SOLN
500.0000 [IU] | Freq: Once | INTRAVENOUS | Status: AC
  Administered 2017-12-21: 500 [IU] via INTRAVENOUS
  Filled 2017-12-21: qty 5

## 2017-12-21 MED ORDER — HEPARIN SOD (PORK) LOCK FLUSH 100 UNIT/ML IV SOLN: 500.0000 [IU] | Freq: Once | INTRAVENOUS | Status: DC | PRN

## 2017-12-21 MED ORDER — SODIUM CHLORIDE 0.9 % IV SOLN
75.0000 mg/m2 | Freq: Once | INTRAVENOUS | Status: AC
  Administered 2017-12-21: 150 mg via INTRAVENOUS
  Filled 2017-12-21: qty 15

## 2017-12-21 MED ORDER — SODIUM CHLORIDE 0.9 % IV SOLN
Freq: Once | INTRAVENOUS | Status: AC
  Administered 2017-12-21: 10:00:00 via INTRAVENOUS
  Filled 2017-12-21: qty 1000

## 2017-12-21 MED ORDER — TRASTUZUMAB CHEMO 150 MG IV SOLR
600.0000 mg | Freq: Once | INTRAVENOUS | Status: AC
  Administered 2017-12-21: 600 mg via INTRAVENOUS
  Filled 2017-12-21: qty 28.57

## 2017-12-21 MED ORDER — PALONOSETRON HCL INJECTION 0.25 MG/5ML
0.2500 mg | Freq: Once | INTRAVENOUS | Status: AC
  Administered 2017-12-21: 0.25 mg via INTRAVENOUS
  Filled 2017-12-21: qty 5

## 2017-12-21 MED ORDER — SODIUM CHLORIDE 0.9 % IV SOLN
870.0000 mg | Freq: Once | INTRAVENOUS | Status: AC
  Administered 2017-12-21: 870 mg via INTRAVENOUS
  Filled 2017-12-21: qty 87

## 2017-12-21 MED ORDER — DIPHENHYDRAMINE HCL 25 MG PO CAPS
50.0000 mg | ORAL_CAPSULE | Freq: Once | ORAL | Status: AC
  Administered 2017-12-21: 50 mg via ORAL
  Filled 2017-12-21: qty 2

## 2017-12-21 MED ORDER — SODIUM CHLORIDE 0.9 % IV SOLN
Freq: Once | INTRAVENOUS | Status: AC
  Administered 2017-12-21: 11:00:00 via INTRAVENOUS
  Filled 2017-12-21: qty 5

## 2017-12-21 MED ORDER — ACETAMINOPHEN 325 MG PO TABS
650.0000 mg | ORAL_TABLET | Freq: Once | ORAL | Status: AC
  Administered 2017-12-21: 650 mg via ORAL
  Filled 2017-12-21: qty 2

## 2017-12-21 NOTE — Progress Notes (Signed)
[facility] day:  12/21/2017  Chief Complaint: Denise [name] is a 42 y.o. female with multi-focal Her2/neu + left breast cancer who is seen for assessment prior to cycle #1 neoadjuvant TCHP.  HPI: The patient was last seen in the medical oncology clinic on 12/12/2017 for initial consultation.  She had undergone biopsy which had revealed a grade II invasive ductal carcinoma with calcifications at the 11 o'clock position.  There was metastatic carcinoma in 1 of 1 lymph nodes.  Tumor was ER + (100%), PR + (100%), and Ki67 5%.  Her2/neu was pending and returned [place] positive.  Follow-up IHC testing was 3+.  Bilateral breast MRI on 12/10/2017 had also revealed a second suspicious node along the revealed the posterolateral margin of the pectoralis minor (cortex 5 mm).  She was denied PET scan.  Chest, abdomen, and pelvic CT on 12/19/2017 revealed a solitary enlarged biopsy-proven metastatic left axillary lymph node.  There were no additional findings suspicious for metastatic disease in the chest, abdomen or pelvis.  There was a nonspecific tiny sclerotic upper right sacral lesion, more likely a benign bone island. Bone scintigraphy correlation versus follow-up CT could be considered.  She underwent port-a-cath placement on 12/17/2017.  She had chemotherapy class on 12/19/2017.  Echo on 12/20/2017 revealed an EF of 60-65%.  During the interim, she has done well.  She denies any concerns.  She notes an allergy to the tape.   Past Medical History:  Diagnosis Date  . Abnormal breast biopsy    [facility], [name]  . Family history of breast cancer   . Family history of ovarian cancer   . Fibroadenoma of breast, left 2000, 2016  . Genetic screening 08/26/2013   BRCA/BART negative/[facility], CHEK2 POS 2017  . Increased risk of breast cancer 2017   IBIS=47%  . Left breast lump 06/18/2017   Fluid/drained [name]  . Migraine   . Monoallelic mutation of  CHEK2 gene in female patient 2017   increased risk of breast and colon cancer    Past Surgical History:  Procedure Laterality Date  . BREAST BIOPSY Bilateral 09/08/14   neg  . BREAST BIOPSY Left 03/16/2015   Procedure: BREAST BIOPSY WITH NEEDLE LOCALIZATION;  Surgeon: [name], MD;  Location: [facility] ORS;  Service: General;  Laterality: Left;  . BREAST CYST ASPIRATION Left 06/18/2017   cyst aspiration  . BREAST EXCISIONAL BIOPSY Left 09/2014  . BREAST EXCISIONAL BIOPSY Right    age 41's  . BREAST SURGERY Right March 2000   benign fibroadenoma  . BREAST SURGERY Left 08/08/13   excision  . BREAST SURGERY Left 09/23/14   excision  . PORTACATH PLACEMENT Right 12/17/2017   Procedure: INSERTION PORT-A-CATH;  Surgeon: [name], MD;  Location: [facility] ORS;  Service: General;  Laterality: Right;    Family History  Problem Relation Age of Onset  . Prostate cancer Father 55  . Breast cancer Paternal Grandmother 38  . Breast cancer Maternal Aunt 60  . Ovarian cancer Maternal Aunt 70  . Breast cancer Paternal Aunt 26  . Pancreatic cancer Maternal Aunt 75    Social History:  reports that she has never smoked. She has never used smokeless tobacco. She reports that she drinks alcohol. She reports that she does not use drugs.  She does not smoke. She "occasionally" drinks alcohol. 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 Patient denies known exposures to radiation on toxins.  Her husband's name is [name]. She has 2 daughters, [name] and [name] (ages 75 1/2 and 72).  The patient is accompanied by her husband, [name], today.  Allergies: No Known Allergies  Current Medications: Current Outpatient Medications  Medication Sig Dispense Refill  . dexamethasone (DECADRON) 4 MG tablet Take 2 tablets (8 mg total) by mouth 2 (two) times daily. Start the day before Taxotere. Take once the day after, then 2 times a day x 2d. 30 tablet 1  . lidocaine-prilocaine (EMLA) cream Apply  to affected area once 30 g 3  . HYDROcodone-acetaminophen (NORCO/VICODIN) 5-325 MG tablet Take 1 tablet by mouth every 4 (four) hours as needed for moderate pain. (Patient not taking: Reported on 12/21/2017) 10 tablet 0  . naproxen sodium (ALEVE) 220 MG tablet Take 440 mg by mouth daily as needed (pain).    . ondansetron (ZOFRAN) 8 MG tablet Take 1 tablet (8 mg total) by mouth 2 (two) times daily as needed (Nausea or vomiting). Begin 4 days after chemotherapy. (Patient not taking: Reported on 12/21/2017) 30 tablet 1  . prochlorperazine (COMPAZINE) 10 MG tablet Take 1 tablet (10 mg total) by mouth every 6 (six) hours as needed (Nausea or vomiting). (Patient not taking: Reported on 12/21/2017) 30 tablet 1   No current facility-administered medications for this visit.     Review of Systems:  GENERAL:  Feels good.  No fevers, sweats or weight loss. PERFORMANCE STATUS (ECOG):  0 HEENT:  No visual changes, runny nose, sore throat, mouth sores or tenderness. Lungs: No shortness of breath or cough.  No hemoptysis. Cardiac:  No chest pain, palpitations, orthopnea, or PND. GI:  No nausea, vomiting, diarrhea, constipation, melena or hematochezia. GU:  No urgency, frequency, dysuria, or hematuria. Musculoskeletal:  No back pain.  No joint pain.  No muscle tenderness. Extremities:  No pain or swelling. Skin:  Tape allergy.  Neuro:  No headache, numbness or weakness, balance or coordination issues. Endocrine:  No diabetes, thyroid issues, hot flashes or night sweats. Psych:  No mood changes, depression or anxiety. Pain:  No focal pain. Review of systems:  All other systems reviewed and found to be negative.   Physical Exam: Blood pressure (!) 142/90, pulse (!) 105, temperature (!) 97.4 F (36.3 C), temperature source Tympanic, resp. rate 18, weight 180 lb (81.6 kg), last menstrual period 12/15/2017. GENERAL:  Well developed, well nourished, woman sitting comfortably in the exam room in no acute  distress. MENTAL STATUS:  Alert and oriented to person, place and time. HEAD:  Short brown hair.  Normocephalic, atraumatic, face symmetric, no Cushingoid features. EYES:  Pupils equal round and reactive to light and accomodation.  No conjunctivitis or scleral icterus. ENT:  Oropharynx clear without lesion.  Tongue normal. Mucous membranes moist.  RESPIRATORY:  Clear to auscultation without rales, wheezes or rhonchi. CARDIOVASCULAR:  Regular rate and rhythm without murmur, rub or gallop. CHEST WALL:  Right sided port-a-cath.  Tape irritation.  No increased warmth or tenderness. ABDOMEN:  Soft, non-tender, with active bowel sounds, and no hepatosplenomegaly.  No masses. SKIN:  No rashes, ulcers or lesions. EXTREMITIES: No edema, no skin discoloration or tenderness.  No palpable cords. LYMPH NODES: No palpable cervical, supraclavicular, axillary or inguinal adenopathy  NEUROLOGICAL: Unremarkable. PSYCH:  Appropriate.    Infusion on 12/21/2017  Component Date Value Ref Range Status  . Preg Test, Ur 12/21/2017 NEGATIVE  NEGATIVE Final   Performed at [facility], [address] [place],  [address]  . Magnesium  12/21/2017 2.0  1.7 - 2.4 mg/dL Final   Performed at [facility], [address], [place], [place] [address]  . Sodium 12/21/2017 136  135 - 145 mmol/L Final  . Potassium 12/21/2017 3.7  3.5 - 5.1 mmol/L Final  . Chloride 12/21/2017 103  98 - 111 mmol/L Final   Please note change in reference range.  . CO2 12/21/2017 19* 22 - 32 mmol/L Final  . Glucose, Bld 12/21/2017 279* 70 - 99 mg/dL Final   Please note change in reference range.  . BUN 12/21/2017 12  6 - 20 mg/dL Final   Please note change in reference range.  . Creatinine, Ser 12/21/2017 0.78  0.44 - 1.00 mg/dL Final  . Calcium 12/21/2017 9.6  8.9 - 10.3 mg/dL Final  . Total Protein 12/21/2017 8.1  6.5 - 8.1 g/dL Final  . Albumin 12/21/2017 4.3  3.5 - 5.0 g/dL Final  . AST 12/21/2017 41  15 - 41 U/L  Final  . ALT 12/21/2017 33  0 - 44 U/L Final   Please note change in reference range.  . Alkaline Phosphatase 12/21/2017 55  38 - 126 U/L Final  . Total Bilirubin 12/21/2017 0.8  0.3 - 1.2 mg/dL Final  . GFR calc non Af Amer 12/21/2017 >60  >60 mL/min Final  . GFR calc Af Amer 12/21/2017 >60  >60 mL/min Final   Comment: (NOTE) The eGFR has been calculated using the CKD EPI equation. This calculation has not been validated in all clinical situations. eGFR's persistently <60 mL/min signify possible Chronic Kidney Disease.   Georgiann Hahn gap 12/21/2017 14  5 - 15 Final   Performed at [facility], [address] [place], [place] [address]  . WBC 12/21/2017 14.4* 3.6 - 11.0 K/uL Final  . RBC 12/21/2017 4.78  3.80 - 5.20 MIL/uL Final  . Hemoglobin 12/21/2017 14.3  12.0 - 16.0 g/dL Final  . HCT 12/21/2017 40.7  35.0 - 47.0 % Final  . MCV 12/21/2017 85.1  80.0 - 100.0 fL Final  . MCH 12/21/2017 30.0  26.0 - 34.0 pg Final  . MCHC 12/21/2017 35.3  32.0 - 36.0 g/dL Final  . RDW 12/21/2017 13.1  11.5 - 14.5 % Final  . Platelets 12/21/2017 267  150 - 440 K/uL Final  . Neutrophils Relative % 12/21/2017 93  % Final  . Neutro Abs 12/21/2017 13.3* 1.4 - 6.5 K/uL Final  . Lymphocytes Relative 12/21/2017 5  % Final  . Lymphs Abs 12/21/2017 0.8* 1.0 - 3.6 K/uL Final  . Monocytes Relative 12/21/2017 2  % Final  . Monocytes Absolute 12/21/2017 0.3  0.2 - 0.9 K/uL Final  . Eosinophils Relative 12/21/2017 0  % Final  . Eosinophils Absolute 12/21/2017 0.0  0 - 0.7 K/uL Final  . Basophils Relative 12/21/2017 0  % Final  . Basophils Absolute 12/21/2017 0.0  0 - 0.1 K/uL Final   Performed at [facility], [address] [place], [place] [address]    Assessment:  QUANESHIA WAREING is a 42 y.o. female with multi-focal stage IB Her2/neu + left breast cancer s/p index breast mass and axillary node biopsy on 12/06/2017.  Pathology revealed grade II invasive ductal carcinoma with calcifications at  the 11 o'clock position.  There was metastatic carcinoma in 1 of 1 lymph nodes.  Tumor was ER + (100%), PR + (100%), and Ki67 5%.  Her2/neu was 3+ by IHC (heterogeneous) and FISH +.  Diagnostic left mammogram and ultrasound on  12/06/2017 revealed a 1.7 x 1.3 x 1.3 cm irregular hypoechoic mass with internal calcifications at the 11 o'clock position 2 cm from the nipple with internal calcifications. There was a similar appearing 0.9 x 0.8 x 0.7 cm mass at the 11 o'clock position 4 cm from the nipple (2.4 cm from the index lesion).  There were suspicious, segmental calcifications originating from the index lesion and extending 5 cm posteriorly.  There was a 0.6 cm morphologically abnormal left axillary lymph node.  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 The adjacent and more superiorly located 11 o'clock mass (7 x 12 mm) seen on recent ultrasound, 4 cm from the nipple on ultrasound, was also identified. There were 2 probable satellite lesions (7 mm, medial lesion; posterior and lateral lesion). The total span of malignancy was 3 x 3.1 x 4.5 cm.  There were calcifications extending up to 5 cm posterior to the biopsied malignancy which were highly suspicious on mammography but not appreciated.  There was a mass in the lower outer left breast measured 5 mm, representing a change.  The known metastatic node in the left axilla was identified. A node along the posterolateral margin of the pectoralis minor (cortex 5 mm) was nonspecific but at least somewhat suspicious.  Chest, abdomen, and pelvic CT on 12/19/2017 revealed a solitary enlarged biopsy-proven metastatic left axillary lymph node.  There were no additional findings suspicious for metastatic disease in the chest, abdomen or pelvis.  There was a nonspecific tiny sclerotic upper right sacral lesion, more likely a benign bone island. Bone  scintigraphy correlation versus follow-up CT could be considered.  [facility] genetic testing on 08/26/2013 was negative for BRCA1 and 2. 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  She has a family history significant for breast, ovarian, pancreatic, and prostate cancer.  Symptomatically, she feels good.  She denies any bone pain.  Exam reveals minor tape irritation.  Plan: 1.  Labs today:  CBC with diff, CMP, Mg. 2.  Discuss chest, abdomen, pelvic CT.  Discuss known lymph node disease.  Discuss sacral lesion.  Lesion is very small (elliptical 9 mm x 3 mm) and appears to be a bone island.  Imaging reviewed with radiology.  Discuss inability to get PET scan approved.  Discuss planned bone scan.  Unable to be performed until next week.  Discuss delaying chemotherapy or rescheduling bone scan until right before 2nd cycle as imaging likely to be affected by Neulasta.  Patient wishes to proceed with chemotherapy. 3.  Discuss Her2/neu testing- confirmed by IHC (3+ heterogeneous). 4.  Discuss chemotherapy plan.  Side effects of chemotherapy reviewed.  Discussed use of Claritin for Neulasta induced bone pain.  Discuss use of anti-emetics. 5.  Cycle #1 TCHP today with OnPro Neulasta support.  Patient consented to treatment. 6.  Reschedule bone bone scan to 01/11/2018. 7.  RTC on 12/31/2017 for NP assessment and labs (CBC with diff, BMP). 8.  RTC on 01/14/2018 for MD assessment, labs (CBC with diff, CMP, Mg), and cycle #2 TCHP followed by Margarette Canada the next day.   Lequita Asal, MD  12/21/2017, 2:35 PM

## 2017-12-21 NOTE — Progress Notes (Signed)
Patient scheduled for 1st chemo treatment today.

## 2017-12-27 ENCOUNTER — Telehealth: Payer: Self-pay | Admitting: *Deleted

## 2017-12-27 ENCOUNTER — Other Ambulatory Visit: Payer: Self-pay | Admitting: Urgent Care

## 2017-12-27 ENCOUNTER — Encounter: Payer: Self-pay | Admitting: Urgent Care

## 2017-12-27 MED ORDER — HYDROCODONE-ACETAMINOPHEN 5-325 MG PO TABS: 1.0000 | ORAL_TABLET | Freq: Four times a day (QID) | ORAL | 0 refills | Status: DC | PRN

## 2017-12-27 NOTE — Telephone Encounter (Signed)
Spoke to patient and informed her the [place] had been sent to her pharmacy. She was instructed to call us if pain gets worse and not better. She verbalized understanding.     dhs

## 2017-12-27 NOTE — Telephone Encounter (Signed)
Patient called to inquire about what she can take for severe bony pain due to Neulasta injection. She has been taking Claritin as instructed every morning and has tried Tylenol with no relief. Please advise.  [phone]    dhs

## 2017-12-27 NOTE — Telephone Encounter (Signed)
Spoke to patient regarding her tolerance of the Norco she was on recently, and she stated that she had no problems with it. She is fine taking whatever we decide to send in for her. Patient's pharmacy is [facility].      dhs

## 2017-12-27 NOTE — Telephone Encounter (Signed)
How does she tolerate pain medications? I seen she has been on Norco in the recent past. If that was too strong, we could try some Tramadol. I am willing to do either in order to get her pain under control.

## 2017-12-27 NOTE — Telephone Encounter (Signed)
Very good. I sent in Norco 5/325 mg q6h PRN (Disp #20) to [facility] on [address].   Honor Loh, MSN, APRN, FNP-C, CEN Oncology/Hematology Nurse Practitioner  [facility] 12/27/17, 9:12 AM

## 2017-12-28 ENCOUNTER — Encounter: Payer: Self-pay | Admitting: *Deleted

## 2017-12-28 MED ORDER — NYSTATIN 100000 UNIT/ML MT SUSP: OROMUCOSAL | 0 refills | Status: DC

## 2017-12-30 NOTE — Progress Notes (Signed)
[facility] day:  12/31/2017  Chief Complaint: Denise [name] is a 42 y.o. female with multi-focal Her2/neu + left breast cancer who is seen for 10 day nadir assessment following cycle #1 neoadjuvant TCHP.  HPI: The patient was last seen in the medical oncology clinic on 12/21/2017. At that time, patient was doing well. She denied acute complaints. She was anxious about her first treatment. Exam revealed minor skin irritation related to the tape that was used. WBC 14,400 (ANC 13,300). She received cycle #1 TCHP with OnPro Neulasta.   Patient is scheduled for a bone scan on 01/11/2018 to further evaluate a nonspecific tiny sclerotic upper right sacral lesion noted on recent CT imaging. Of note, radiologist commented that area likely represents a benign bone island.  She contacted the clinic on 12/27/2017 with complaints of significant bone pain. She was using the recommended loratadine following her Neulasta injection, however it was not helping. Patient also using APAP without relief. Norco 5/325 mg q6h PRN (Disp #20) was sent in for patient to use. She notes that this intervention was effective in managing her pain.  Patient contacted the clinic on 12/28/2017 with concerns that she was developing oral candidiasis. Patient stated that her  "taste buds all feel like they are burnt, and they are a little raised. It also hurts a bit when I swallow". She did not some white coating on her tongue as well. Nystatin mouthwash was called in for patient to used 4x/day x 1 week, then PRN.   In the interim, patient has done well overall following her first chemotherapy treatment.  For the first 3 days following treatment patient had a nonspecific headache.  She developed a minor sore throat.  patient experiencing nausea, however described ondansetron is effective in managing this symptom.  She has had diarrhea 3x/day over the course of the last few days.  She is  taking loperamide, which has been effective.  Patient's energy remains stable.  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  Patient complains of xerostomia for the last 3 days.  Previously reported issues with her taste buds and sore throat resolved with the use of the prescribed antifungal mouthwash.  Patient experienced Neulasta associated bone pain which was not relieved with the use of, the recommended loratadine.  Patient required the use of opioid analgesic medications to help with her bone pain.  Patient only required the use of this medication "a few times".  Patient denies that she has experienced any B symptoms. She denies any interval infections. Patient advises that she maintains an adequate appetite. She is eating well. Weight today is 179 lb 4 oz (81.3 kg), which compared to her last visit to the clinic, represents a 1 pound decrease  Patient denies pain in the clinic today.  Past Medical History:  Diagnosis Date  . Abnormal breast biopsy    [facility], [name]  . Family history of breast cancer   . Family history of ovarian cancer   . Fibroadenoma of breast, left 2000, 2016  . Genetic screening 08/26/2013   BRCA/BART negative/[facility], CHEK2 POS 2017  . Increased risk of breast cancer 2017   IBIS=47%  . Left breast lump 06/18/2017   Fluid/drained [name]  . Migraine   . Monoallelic mutation of CHEK2 gene in female patient 2017   increased risk of breast and colon cancer    Past Surgical History:  Procedure Laterality Date  . BREAST BIOPSY  Bilateral 09/08/14   neg  . BREAST BIOPSY Left 03/16/2015   Procedure: BREAST BIOPSY WITH NEEDLE LOCALIZATION;  Surgeon: [name], MD;  Location: [facility] ORS;  Service: General;  Laterality: Left;  . BREAST CYST ASPIRATION Left 06/18/2017   cyst aspiration  . BREAST EXCISIONAL BIOPSY Left 09/2014  . BREAST EXCISIONAL BIOPSY Right    age 24's  . BREAST SURGERY Right March 2000   benign fibroadenoma  . BREAST  SURGERY Left 08/08/13   excision  . BREAST SURGERY Left 09/23/14   excision  . PORTACATH PLACEMENT Right 12/17/2017   Procedure: INSERTION PORT-A-CATH;  Surgeon: [name], MD;  Location: [facility] ORS;  Service: General;  Laterality: Right;    Family History  Problem Relation Age of Onset  . Prostate cancer Father 53  . Breast cancer Paternal Grandmother 82  . Breast cancer Maternal Aunt 60  . Ovarian cancer Maternal Aunt 70  . Breast cancer Paternal Aunt 34  . Pancreatic cancer Maternal Aunt 75    Social History:  reports that she has never smoked. She has never used smokeless tobacco. She reports that she drinks alcohol. She reports that she does not use drugs.  She does not smoke. She "occasionally" drinks alcohol. 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 Patient denies known exposures to radiation on toxins. Her husband's name is [name]. She has 2 daughters, [name] and [name] (ages 37 1/2 and 74).  The patient is accompanied by her husband, [name], today.  Allergies: No Known Allergies  Current Medications: Current Outpatient Medications  Medication Sig Dispense Refill  . dexamethasone (DECADRON) 4 MG tablet Take 2 tablets (8 mg total) by mouth 2 (two) times daily. Start the day before Taxotere. Take once the day after, then 2 times a day x 2d. 30 tablet 1  . HYDROcodone-acetaminophen (NORCO/VICODIN) 5-325 MG tablet Take 1 tablet by mouth every 6 (six) hours as needed for moderate pain. 20 tablet 0  . lidocaine-prilocaine (EMLA) cream Apply to affected area once 30 g 3  . nystatin (MYCOSTATIN) 100000 UNIT/ML suspension Swish and swallow 5 cc QID x 5 days, then PRN. 240 mL 0  . ondansetron (ZOFRAN) 8 MG tablet Take 1 tablet (8 mg total) by mouth every 8 (eight) hours as needed (Nausea or vomiting). 30 tablet 3  . prochlorperazine (COMPAZINE) 10 MG tablet Take 1 tablet (10 mg total) by mouth every 6 (six) hours as needed (Nausea or vomiting). 30 tablet 1  .  naproxen sodium (ALEVE) 220 MG tablet Take 440 mg by mouth daily as needed (pain).     No current facility-administered medications for this visit.     Review of Systems  Constitutional: Positive for weight loss (down 1 pound). Negative for diaphoresis, fever and malaise/fatigue.       "I am doing okay today".  HENT: Positive for sore throat (mild posterior pharyngeal erythema). Negative for nosebleeds.        Oral candidiasis declared as taste buds feeling "burnt and raised".  Xerostomia.  Eyes: Negative.   Respiratory: Negative for cough, hemoptysis, sputum production and shortness of breath.   Cardiovascular: Negative for chest pain, palpitations, orthopnea, leg swelling and PND.  Gastrointestinal: Positive for diarrhea (improved with loperamide) and nausea (controlled with ondansetron). Negative for abdominal pain, blood in stool, constipation, melena and vomiting.  Genitourinary: Negative for dysuria, frequency, hematuria and urgency.  Musculoskeletal: Negative for back pain, falls, joint pain and myalgias.  Neulasta-induced bone pain  Skin: Negative for itching and rash.       Power port to RIGHT chest wall.   Neurological: Positive for headaches (x 3 days following treatment ). Negative for dizziness, tremors and weakness.  Endo/Heme/Allergies: Does not bruise/bleed easily.  Psychiatric/Behavioral: Negative for depression, memory loss and suicidal ideas. The patient is not nervous/anxious and does not have insomnia.   All other systems reviewed and are negative.  Performance status (ECOG): 0 - Asymptomatic  Vital Signs BP 122/87 (BP Location: Left Arm, Patient Position: Sitting)   Pulse (!) 111 Comment: Patient came up the stairs  Temp 98.8 F (37.1 C) (Tympanic)   Resp 18   Wt 179 lb 4 oz (81.3 kg)   LMP 12/15/2017 Comment: neg preg test 12/17/17  BMI 28.07 kg/m   Physical Exam  Constitutional: She is oriented to person, place, and time and well-developed,  well-nourished, and in no distress.  HENT:  Head: Normocephalic and atraumatic.  Mouth/Throat: Uvula is midline. Mucous membranes are dry. Posterior oropharyngeal erythema present. No oropharyngeal exudate, posterior oropharyngeal edema or tonsillar abscesses.  Slight white coating to tongue and buccal mucosa. Short brown hair.  Eyes: Pupils are equal, round, and reactive to light. EOM are normal. No scleral icterus.  Neck: Normal range of motion. Neck supple. No tracheal deviation present. No thyromegaly present.  Cardiovascular: Normal rate, regular rhythm and normal heart sounds. Exam reveals no gallop and no friction rub.  No murmur heard. Pulmonary/Chest: Effort normal and breath sounds normal. No respiratory distress. She has no wheezes. She has no rales.  Port to RIGHT chest wall.   Abdominal: Soft. Bowel sounds are normal. She exhibits no distension. There is no tenderness.  Musculoskeletal: Normal range of motion. She exhibits no edema or tenderness.  Lymphadenopathy:    She has no cervical adenopathy.    She has no axillary adenopathy.       Right: No inguinal and no supraclavicular adenopathy present.       Left: No inguinal and no supraclavicular adenopathy present.  Neurological: She is alert and oriented to person, place, and time.  Skin: Skin is warm and dry. No rash noted. No erythema.  Psychiatric: Mood, affect and judgment normal.  Nursing note and vitals reviewed.   Appointment on 12/31/2017  Component Date Value Ref Range Status  . Sodium 12/31/2017 137  135 - 145 mmol/L Final  . Potassium 12/31/2017 3.6  3.5 - 5.1 mmol/L Final  . Chloride 12/31/2017 105  98 - 111 mmol/L Final   Please note change in reference range.  . CO2 12/31/2017 24  22 - 32 mmol/L Final  . Glucose, Bld 12/31/2017 93  70 - 99 mg/dL Final   Please note change in reference range.  . BUN 12/31/2017 8  6 - 20 mg/dL Final   Please note change in reference range.  . Creatinine, Ser 12/31/2017  0.65  0.44 - 1.00 mg/dL Final  . Calcium 12/31/2017 8.6* 8.9 - 10.3 mg/dL Final  . GFR calc non Af Amer 12/31/2017 >60  >60 mL/min Final  . GFR calc Af Amer 12/31/2017 >60  >60 mL/min Final   Comment: (NOTE) The eGFR has been calculated using the CKD EPI equation. This calculation has not been validated in all clinical situations. eGFR's persistently <60 mL/min signify possible Chronic Kidney Disease.   Georgiann Hahn gap 12/31/2017 8  5 - 15 Final   Performed at [facility], [address] [place], [place] [address]  .  WBC 12/31/2017 26.6* 3.6 - 11.0 K/uL Final  . RBC 12/31/2017 4.77  3.80 - 5.20 MIL/uL Final  . Hemoglobin 12/31/2017 13.6  12.0 - 16.0 g/dL Final  . HCT 12/31/2017 40.1  35.0 - 47.0 % Final  . MCV 12/31/2017 84.0  80.0 - 100.0 fL Final  . MCH 12/31/2017 28.6  26.0 - 34.0 pg Final  . MCHC 12/31/2017 34.0  32.0 - 36.0 g/dL Final  . RDW 12/31/2017 13.2  11.5 - 14.5 % Final  . Platelets 12/31/2017 185  150 - 440 K/uL Final  . Neutrophils Relative % 12/31/2017 82  % Final  . Neutro Abs 12/31/2017 21.8* 1.4 - 6.5 K/uL Final  . Lymphocytes Relative 12/31/2017 14  % Final  . Lymphs Abs 12/31/2017 3.6  1.0 - 3.6 K/uL Final  . Monocytes Relative 12/31/2017 4  % Final  . Monocytes Absolute 12/31/2017 1.0* 0.2 - 0.9 K/uL Final  . Eosinophils Relative 12/31/2017 0  % Final  . Eosinophils Absolute 12/31/2017 0.0  0 - 0.7 K/uL Final  . Basophils Relative 12/31/2017 0  % Final  . Basophils Absolute 12/31/2017 0.1  0 - 0.1 K/uL Final   Performed at [facility], [address] [place], [place] [address]    Assessment:  Denise [name] is a 41 y.o. female with multi-focal stage IB Her2/neu + left breast cancer s/p index breast mass and axillary node biopsy on 12/06/2017.  Pathology revealed grade II invasive ductal carcinoma with calcifications at the 11 o'clock position.  There was metastatic carcinoma in 1 of 1 lymph nodes.  Tumor was ER + (100%), PR + (100%), and Ki67  5%.  Her2/neu was 3+ by IHC (heterogeneous) and FISH +.  Diagnostic left mammogram and ultrasound on 12/06/2017 revealed a 1.7 x 1.3 x 1.3 cm irregular hypoechoic mass with internal calcifications at the 11 o'clock position 2 cm from the nipple with internal calcifications. There was a similar appearing 0.9 x 0.8 x 0.7 cm mass at the 11 o'clock position 4 cm from the nipple (2.4 cm from the index lesion).  There were suspicious, segmental calcifications originating from the index lesion and extending 5 cm posteriorly.  There was a 0.6 cm morphologically abnormal left axillary lymph node.  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 The adjacent and more superiorly located 11 o'clock mass (7 x 12 mm) seen on recent ultrasound, 4 cm from the nipple on ultrasound, was also identified. There were 2 probable satellite lesions (7 mm, medial lesion; posterior and lateral lesion). The total span of malignancy was 3 x 3.1 x 4.5 cm.  There were calcifications extending up to 5 cm posterior to the biopsied malignancy which were highly suspicious on mammography but not appreciated.  There was a mass in the lower outer left breast measured 5 mm, representing a change.  The known metastatic node in the left axilla was identified. A node along the posterolateral margin of the pectoralis minor (cortex 5 mm) was nonspecific but at least somewhat suspicious.  Chest, abdomen, and pelvic CT on 12/19/2017 revealed a solitary enlarged biopsy-proven metastatic left axillary lymph node.  There were no additional findings suspicious for metastatic disease in the chest, abdomen or pelvis.  There was a nonspecific tiny sclerotic upper right sacral lesion, more likely a benign bone island. Bone scintigraphy correlation versus follow-up CT could be considered.  [facility] genetic testing on 08/26/2013 was negative for BRCA1  and 2. 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  She has a family history significant for breast, ovarian, pancreatic, and prostate cancer.  She is day 10 s/p cycle #1 TCHP chemotherapy. She is tolerating treatment well.  Symptomatically, patient tolerated her first cycle of chemotherapy well.  She developed headache, sore throat, oral candidiasis, and diarrhea following her treatment.  Her mouth has been dry for the last 3 days.  Patient experiences some nausea, which is controlled by the prescribed ondansetron.  She denies vomiting.  Patient eating well and maintaining an adequate level of hydration.  Exam reveals slight posterior pharyngeal erythema, and evidence of persistent oral candidiasis.  Patient continues on nystatin mouthwash as prescribed.  WBC 26,600 with an [place] of 21,800.  Hemoglobin 13.6, hematocrit 40.1, and platelets 185,000.  Electrolytes normal.  BUN 18 creatinine 0.65 (CrCl 102.6 mL/min  Plan: 1. Labs today:  CBC with diff, CMP, Mg. 2. Discuss thrush.  Patient notes that her symptoms have markedly improved with the use of the nystatin.  Patient has been using mouthwash x3 days.  Encourage patient to continue to use as prescribed. Discuss symptom management.  Patient has antiemetics and pain medications at home to use on a PRN basis. Patient  advising that the  prescribed interventions are adequate at this point. Continue all medications as previously prescribed.  New refill called and on Zofran 8 mg.  Dose increased to 3 times daily (Disp #30 r2).  Discuss plans for bone scan on 01/11/2018 to further evaluate a nonspecific tiny sclerotic upper right sacral lesion noted on recent CT imaging RTC on 01/14/2018 for MD assessment, labs (CBC with diff, CMP, Mg), and cycle #2 TCHP followed by Margarette Canada the next day.   Honor Loh, NP  12/31/2017, 10:07 PM

## 2017-12-31 ENCOUNTER — Inpatient Hospital Stay: Payer: BC Managed Care – PPO | Admitting: Urgent Care

## 2017-12-31 ENCOUNTER — Inpatient Hospital Stay: Payer: BC Managed Care – PPO

## 2017-12-31 VITALS — BP 122/87 | HR 111 | Temp 98.8°F | Resp 18 | Wt 179.2 lb

## 2017-12-31 DIAGNOSIS — T451X5A Adverse effect of antineoplastic and immunosuppressive drugs, initial encounter: Secondary | ICD-10-CM

## 2017-12-31 DIAGNOSIS — B370 Candidal stomatitis: Secondary | ICD-10-CM | POA: Insufficient documentation

## 2017-12-31 DIAGNOSIS — R110 Nausea: Secondary | ICD-10-CM | POA: Diagnosis not present

## 2017-12-31 DIAGNOSIS — Z170 Estrogen receptor positive status [ER+]: Secondary | ICD-10-CM

## 2017-12-31 DIAGNOSIS — R682 Dry mouth, unspecified: Secondary | ICD-10-CM

## 2017-12-31 DIAGNOSIS — K117 Disturbances of salivary secretion: Secondary | ICD-10-CM

## 2017-12-31 DIAGNOSIS — C50412 Malignant neoplasm of upper-outer quadrant of left female breast: Secondary | ICD-10-CM

## 2017-12-31 DIAGNOSIS — R197 Diarrhea, unspecified: Secondary | ICD-10-CM

## 2017-12-31 DIAGNOSIS — C773 Secondary and unspecified malignant neoplasm of axilla and upper limb lymph nodes: Secondary | ICD-10-CM

## 2017-12-31 LAB — CBC WITH DIFFERENTIAL/PLATELET
Basophils Absolute: 0.1 10*3/uL (ref 0–0.1)
Basophils Relative: 0 %
Eosinophils Absolute: 0 10*3/uL (ref 0–0.7)
Eosinophils Relative: 0 %
HCT: 40.1 % (ref 35.0–47.0)
Hemoglobin: 13.6 g/dL (ref 12.0–16.0)
Lymphocytes Relative: 14 %
Lymphs Abs: 3.6 10*3/uL (ref 1.0–3.6)
MCH: 28.6 pg (ref 26.0–34.0)
MCHC: 34 g/dL (ref 32.0–36.0)
MCV: 84 fL (ref 80.0–100.0)
Monocytes Absolute: 1 10*3/uL — ABNORMAL HIGH (ref 0.2–0.9)
Monocytes Relative: 4 %
Neutro Abs: 21.8 10*3/uL — ABNORMAL HIGH (ref 1.4–6.5)
Neutrophils Relative %: 82 %
Platelets: 185 10*3/uL (ref 150–440)
RBC: 4.77 MIL/uL (ref 3.80–5.20)
RDW: 13.2 % (ref 11.5–14.5)
WBC: 26.6 10*3/uL — ABNORMAL HIGH (ref 3.6–11.0)

## 2017-12-31 LAB — BASIC METABOLIC PANEL
Anion gap: 8 (ref 5–15)
BUN: 8 mg/dL (ref 6–20)
CO2: 24 mmol/L (ref 22–32)
Calcium: 8.6 mg/dL — ABNORMAL LOW (ref 8.9–10.3)
Chloride: 105 mmol/L (ref 98–111)
Creatinine, Ser: 0.65 mg/dL (ref 0.44–1.00)
GFR calc Af Amer: >60 mL/min (ref >60)
GFR calc non Af Amer: >60 mL/min (ref >60)
Glucose, Bld: 93 mg/dL (ref 70–99)
Potassium: 3.6 mmol/L (ref 3.5–5.1)
Sodium: 137 mmol/L (ref 135–145)

## 2017-12-31 MED ORDER — ONDANSETRON HCL 8 MG PO TABS: 8.0000 mg | ORAL_TABLET | Freq: Three times a day (TID) | ORAL | 3 refills | Status: DC | PRN

## 2017-12-31 NOTE — Progress Notes (Signed)
Patient states she is experiencing dry mouth  Also states Dr. Mike Gip gave her different instructions for her Decadron than what is on the bottle.

## 2018-01-02 DIAGNOSIS — R197 Diarrhea, unspecified: Secondary | ICD-10-CM | POA: Insufficient documentation

## 2018-01-11 ENCOUNTER — Ambulatory Visit
Admission: RE | Admit: 2018-01-11 | Discharge: 2018-01-11 | Disposition: A | Discharge status: Home / Self Care | Payer: BC Managed Care – PPO | Source: Ambulatory Visit | Attending: Urgent Care | Admitting: Urgent Care

## 2018-01-11 ENCOUNTER — Encounter: Payer: Self-pay | Admitting: Urgent Care

## 2018-01-11 ENCOUNTER — Encounter: Admission: RE | Admit: 2018-01-11 | Payer: BC Managed Care – PPO | Source: Ambulatory Visit

## 2018-01-11 DIAGNOSIS — C50412 Malignant neoplasm of upper-outer quadrant of left female breast: Secondary | ICD-10-CM | POA: Diagnosis present

## 2018-01-11 DIAGNOSIS — Z170 Estrogen receptor positive status [ER+]: Secondary | ICD-10-CM | POA: Insufficient documentation

## 2018-01-11 MED ORDER — TECHNETIUM TC 99M MEDRONATE IV KIT
20.0000 | PACK | Freq: Once | INTRAVENOUS | Status: AC | PRN
  Administered 2018-01-11: 21.681 via INTRAVENOUS

## 2018-01-12 NOTE — Progress Notes (Signed)
[facility] day:  01/14/18  Chief Complaint: Denise [name] is a 42 y.o. female with multi-focal Her2/neu + left breast cancer who is seen for assessment prior to cycle #2 neoadjuvant TCHP with Udencya support.   HPI: The patient was last seen in the medical oncology clinic on 12/31/2017. At that time, patient was doing well following her initial TCHP cycle. She had a nonspecific headache x 3 days following treatment. She noted nausea without vomiting and some minor diarrhea. She experienced xerostomia and developed early mucositis, which was treated early with Nystatin mouthwash. She received Neulasta with her first cycle that caused her to experience significant bone pain not controlled with use of loratadine. Norco called in, which was effective. Eating well overall; weight down 1 pound. No pain in clinic at cycle 1 nadir visit. She was scheduled for a bone scan on 01/11/2018.   Bone scan performed on 01/11/2018 to further assess area of concern on recent CT imaging. Imaging was negative for metastatic pattern. No focal uptake noted in the sacral area highlighted on previous CT. Finding, as mentioned on the previous CT read, likely represents a bone island. Patient was contacted and results were reviewed.   In the interim, patient is feeling generally well today. Post-chemotherapy symptoms have fully resolved. She denies any recurrent symptoms. Nausea, vomiting, and diarrhea has resolved. Her thrush has fully resolved with the use of the prescribed Nystatin mouthwash. Patient denies that she has experienced any B symptoms. She denies any interval infections. Patient denies palmar-plantar erythrodysesthesia, neuropathy, and cold sensitivity associated with the current treatment regimen. She has some very mild tearing.   Patient advises that she maintains an adequate appetite. She is eating well. Weight today is 180 lb 9 oz (81.9 kg), which compared to her  last visit to the clinic, represents a 1 pound increase.   Patient denies pain in the clinic today.   Past Medical History:  Diagnosis Date  . Abnormal breast biopsy    [facility], [name]  . Family history of breast cancer   . Family history of ovarian cancer   . Fibroadenoma of breast, left 2000, 2016  . Genetic screening 08/26/2013   BRCA/BART negative/[facility], CHEK2 POS 2017  . Increased risk of breast cancer 2017   IBIS=47%  . Left breast lump 06/18/2017   Fluid/drained [name]  . Migraine   . Monoallelic mutation of CHEK2 gene in female patient 2017   increased risk of breast and colon cancer    Past Surgical History:  Procedure Laterality Date  . BREAST BIOPSY Bilateral 09/08/14   neg  . BREAST BIOPSY Left 03/16/2015   Procedure: BREAST BIOPSY WITH NEEDLE LOCALIZATION;  Surgeon: [name], MD;  Location: [facility] ORS;  Service: General;  Laterality: Left;  . BREAST CYST ASPIRATION Left 06/18/2017   cyst aspiration  . BREAST EXCISIONAL BIOPSY Left 09/2014  . BREAST EXCISIONAL BIOPSY Right    age 57's  . BREAST SURGERY Right March 2000   benign fibroadenoma  . BREAST SURGERY Left 08/08/13   excision  . BREAST SURGERY Left 09/23/14   excision  . PORTACATH PLACEMENT Right 12/17/2017   Procedure: INSERTION PORT-A-CATH;  Surgeon: [name], MD;  Location: [facility] ORS;  Service: General;  Laterality: Right;    Family History  Problem Relation Age of Onset  . Prostate cancer Father 53  . Breast cancer Paternal Grandmother 3  . Breast cancer Maternal Aunt 60  .  Ovarian cancer Maternal Aunt 70  . Breast cancer Paternal Aunt 83  . Pancreatic cancer Maternal Aunt 75    Social History:  reports that she has never smoked. She has never used smokeless tobacco. She reports that she drinks alcohol. She reports that she does not use drugs.  She does not smoke. She "occasionally" drinks alcohol. 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 Patient  denies known exposures to radiation on toxins. Her husband's name is [name]. She has 2 daughters, [name] and [name] (ages 46 1/2 and 62).  Her husband's name is [name].  Allergies:  Allergies  Allergen Reactions  . Tape     Minor skin irritation     Current Medications: Current Outpatient Medications  Medication Sig Dispense Refill  . dexamethasone (DECADRON) 4 MG tablet Take 2 tablets (8 mg total) by mouth 2 (two) times daily. Start the day before Taxotere. Take once the day after, then 2 times a day x 2d. 30 tablet 1  . HYDROcodone-acetaminophen (NORCO/VICODIN) 5-325 MG tablet Take 1 tablet by mouth every 6 (six) hours as needed for moderate pain. 20 tablet 0  . lidocaine-prilocaine (EMLA) cream Apply to affected area once 30 g 3  . naproxen sodium (ALEVE) 220 MG tablet Take 440 mg by mouth daily as needed (pain).    . nystatin (MYCOSTATIN) 100000 UNIT/ML suspension Swish and swallow 5 cc QID x 5 days, then PRN. 240 mL 0  . ondansetron (ZOFRAN) 8 MG tablet Take 1 tablet (8 mg total) by mouth every 8 (eight) hours as needed (Nausea or vomiting). 30 tablet 3  . prochlorperazine (COMPAZINE) 10 MG tablet Take 1 tablet (10 mg total) by mouth every 6 (six) hours as needed (Nausea or vomiting). 30 tablet 1   No current facility-administered medications for this visit.     Review of Systems  Constitutional: Negative for diaphoresis, fever, malaise/fatigue and weight loss.       "I feel so much better".   HENT: Negative for nosebleeds and sore throat.        Xerostomia - improved. Oral candidiasis - resolved.   Eyes: Negative.  Negative for pain and redness.       Mild tearing  Respiratory: Negative for cough, hemoptysis, sputum production and shortness of breath.   Cardiovascular: Negative for chest pain, palpitations, orthopnea, leg swelling and PND.       TACHYcardic to 120  Gastrointestinal: Negative for abdominal pain, blood in stool, constipation, diarrhea (resolved), melena, nausea  (resolved) and vomiting.  Genitourinary: Negative for dysuria, frequency, hematuria and urgency.  Musculoskeletal: Negative for back pain, falls, joint pain and myalgias.       (+) bone pain following Neulasta  Skin: Negative for itching and rash.       Power port to RIGHT chest wall  Neurological: Negative for dizziness, tremors, weakness and headaches.  Endo/Heme/Allergies: Does not bruise/bleed easily.  Psychiatric/Behavioral: Negative for depression, memory loss and suicidal ideas. The patient is not nervous/anxious and does not have insomnia.   All other systems reviewed and are negative.  Performance status (ECOG): 0 - Asymptomatic  Vital Signs BP 126/90 (BP Location: Left Arm, Patient Position: Sitting)   Pulse (!) 120   Temp (!) 96.7 F (35.9 C) (Tympanic)   Resp 18   Wt 180 lb 9 oz (81.9 kg)   LMP 12/15/2017 Comment: neg preg test 12/17/17  BMI 28.28 kg/m   Physical Exam  Constitutional: She is oriented to person, place, and  time and well-developed, well-nourished, and in no distress.  HENT:  Head: Normocephalic and atraumatic.  Wearing a dark gray wrap.  Eyes: Pupils are equal, round, and reactive to light. EOM are normal. No scleral icterus.  Neck: Normal range of motion. Neck supple. No tracheal deviation present. No thyromegaly present.  Cardiovascular: Regular rhythm and normal heart sounds. Tachycardia present. Exam reveals no gallop and no friction rub.  No murmur heard. Pulmonary/Chest: Effort normal and breath sounds normal. No respiratory distress. She has no wheezes. She has no rales.  Abdominal: Soft. Bowel sounds are normal. She exhibits no distension. There is no tenderness.  Musculoskeletal: Normal range of motion. She exhibits no edema or tenderness.  Lymphadenopathy:    She has no cervical adenopathy.    She has no axillary adenopathy.       Right: No inguinal and no supraclavicular adenopathy present.       Left: No inguinal and no supraclavicular  adenopathy present.  Neurological: She is alert and oriented to person, place, and time.  Skin: Skin is warm and dry. No rash noted. No erythema.  Power power to RIGHT chest wall  Psychiatric: Mood, affect and judgment normal.  Nursing note and vitals reviewed.   Infusion on 01/14/2018  Component Date Value Ref Range Status  . Magnesium 01/14/2018 1.9  1.7 - 2.4 mg/dL Final   Performed at [facility], [address], [place], [place] [address]  . Sodium 01/14/2018 136  135 - 145 mmol/L Final  . Potassium 01/14/2018 4.0  3.5 - 5.1 mmol/L Final  . Chloride 01/14/2018 104  98 - 111 mmol/L Final  . CO2 01/14/2018 20* 22 - 32 mmol/L Final  . Glucose, Bld 01/14/2018 263* 70 - 99 mg/dL Final  . BUN 01/14/2018 12  6 - 20 mg/dL Final  . Creatinine, Ser 01/14/2018 0.72  0.44 - 1.00 mg/dL Final  . Calcium 01/14/2018 9.6  8.9 - 10.3 mg/dL Final  . Total Protein 01/14/2018 7.7  6.5 - 8.1 g/dL Final  . Albumin 01/14/2018 4.2  3.5 - 5.0 g/dL Final  . AST 01/14/2018 45* 15 - 41 U/L Final  . ALT 01/14/2018 66* 0 - 44 U/L Final  . Alkaline Phosphatase 01/14/2018 67  38 - 126 U/L Final  . Total Bilirubin 01/14/2018 0.9  0.3 - 1.2 mg/dL Final  . GFR calc non Af Amer 01/14/2018 >60  >60 mL/min Final  . GFR calc Af Amer 01/14/2018 >60  >60 mL/min Final   Comment: (NOTE) The eGFR has been calculated using the CKD EPI equation. This calculation has not been validated in all clinical situations. eGFR's persistently <60 mL/min signify possible Chronic Kidney Disease.   Georgiann Hahn gap 01/14/2018 12  5 - 15 Final   Performed at [facility], [address] [place], [place] [address]  . WBC 01/14/2018 13.6* 3.6 - 11.0 K/uL Final  . RBC 01/14/2018 4.38  3.80 - 5.20 MIL/uL Final  . Hemoglobin 01/14/2018 12.7  12.0 - 16.0 g/dL Final  . HCT 01/14/2018 36.7  35.0 - 47.0 % Final  . MCV 01/14/2018 83.8  80.0 - 100.0 fL Final  . MCH 01/14/2018 28.9  26.0 - 34.0 pg Final  . MCHC 01/14/2018 34.5   32.0 - 36.0 g/dL Final  . RDW 01/14/2018 14.1  11.5 - 14.5 % Final  . Platelets 01/14/2018 392  150 - 440 K/uL Final  . Neutrophils Relative % 01/14/2018 94  % Final  . Neutro Abs 01/14/2018  12.8* 1.4 - 6.5 K/uL Final  . Lymphocytes Relative 01/14/2018 5  % Final  . Lymphs Abs 01/14/2018 0.6* 1.0 - 3.6 K/uL Final  . Monocytes Relative 01/14/2018 1  % Final  . Monocytes Absolute 01/14/2018 0.2  0.2 - 0.9 K/uL Final  . Eosinophils Relative 01/14/2018 0  % Final  . Eosinophils Absolute 01/14/2018 0.0  0 - 0.7 K/uL Final  . Basophils Relative 01/14/2018 0  % Final  . Basophils Absolute 01/14/2018 0.0  0 - 0.1 K/uL Final   Performed at [facility], [address] [place], [place] [address]  . Preg Test, Ur 01/14/2018 NEGATIVE  NEGATIVE Final   Performed at [facility], [address] [place], [place] [address]    Assessment:  Denise [name] is a 42 y.o. female with multi-focal stage IB Her2/neu + left breast cancer s/p index breast mass and axillary node biopsy on 12/06/2017.  Pathology revealed grade II invasive ductal carcinoma with calcifications at the 11 o'clock position.  There was metastatic carcinoma in 1 of 1 lymph nodes.  Tumor was ER + (100%), PR + (100%), and Ki67 5%.  Her2/neu was 3+ by IHC (heterogeneous) and FISH +.  Diagnostic left mammogram and ultrasound on 12/06/2017 revealed a 1.7 x 1.3 x 1.3 cm irregular hypoechoic mass with internal calcifications at the 11 o'clock position 2 cm from the nipple with internal calcifications. There was a similar appearing 0.9 x 0.8 x 0.7 cm mass at the 11 o'clock position 4 cm from the nipple (2.4 cm from the index lesion).  There were suspicious, segmental calcifications originating from the index lesion and extending 5 cm posteriorly.  There was a 0.6 cm morphologically abnormal left axillary lymph node.  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 The adjacent and more superiorly located 11 o'clock mass (7 x 12 mm) seen on recent ultrasound, 4 cm from the nipple on ultrasound, was also identified. There were 2 probable satellite lesions (7 mm, medial lesion; posterior and lateral lesion). The total span of malignancy was 3 x 3.1 x 4.5 cm.  There were calcifications extending up to 5 cm posterior to the biopsied malignancy which were highly suspicious on mammography but not appreciated.  There was a mass in the lower outer left breast measured 5 mm, representing a change.  The known metastatic node in the left axilla was identified. A node along the posterolateral margin of the pectoralis minor (cortex 5 mm) was nonspecific but at least somewhat suspicious.  Chest, abdomen, and pelvic CT on 12/19/2017 revealed a solitary enlarged biopsy-proven metastatic left axillary lymph node.  There were no additional findings suspicious for metastatic disease in the chest, abdomen or pelvis.  There was a nonspecific tiny sclerotic upper right sacral lesion, more likely a benign bone island. Bone scintigraphy correlation versus follow-up CT could be considered.  Bone scan on 01/11/2018 was negative for metastatic pattern. No areas of focal tracer uptake. Area of concern in sacrum on CT likely represents benign bone island.   [facility] genetic testing on 08/26/2013 was negative for BRCA1 and 2. 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  She has a family history significant for breast, ovarian, pancreatic, and prostate cancer.  She is s/p cycle #1 TCHP chemotherapy (12/21/2017) with Neulasta support. She is tolerating treatment well.  Symptomatically, patient is doing  well. Her post treatment symptoms have fully resolved. She denies nausea, vomiting, diarrhea, and pain. Oral candidiasis has resolved.  Patient eating well and maintaining an adequate level of hydration.  Exam stable.  WBC 13,600  with an ANC of 12,800.  Hemoglobin 12.7, hematocrit 36.7, and platelets 392,000.  Electrolytes normal.  BUN 12 creatinine 0.72 (CrCl 102.9 mL/min).  Plan: 1. Labs today:  CBC with diff, CMP, Mg. 2. Review bone scan - negative. 3. Labs reviewed. Blood counts stable and adequate enough for treatment. Will proceed with cycle #2 TCHP with Udencya tomorrow.  4. Discuss symptom management.  Patient has antiemetics and pain medications at home to use on a PRN basis. Patient  advising that the  prescribed interventions are adequate at this point. Continue all medications as previously prescribed.  5. RTC on 02/04/2018 for MD assessment, labs (CBC with diff, CMP, Mg), and cycle #3 TCHP followed by Udencya the next day.   Honor Loh, NP  01/14/18, 9:47 AM   I saw and evaluated the patient, participating in the key portions of the service and reviewing pertinent diagnostic studies and records.  I reviewed the nurse practitioner's note and agree with the findings and the plan.  The assessment and plan were discussed with the patient. Several questions were asked by the patient and answered.   Nolon Stalls, MD 01/14/2018,9:47 AM

## 2018-01-14 ENCOUNTER — Inpatient Hospital Stay: Payer: BC Managed Care – PPO

## 2018-01-14 ENCOUNTER — Encounter: Payer: Self-pay | Admitting: Hematology and Oncology

## 2018-01-14 ENCOUNTER — Inpatient Hospital Stay: Payer: BC Managed Care – PPO | Admitting: Hematology and Oncology

## 2018-01-14 VITALS — HR 118

## 2018-01-14 VITALS — BP 126/90 | HR 120 | Temp 96.7°F | Resp 18 | Wt 180.6 lb

## 2018-01-14 DIAGNOSIS — R110 Nausea: Secondary | ICD-10-CM | POA: Diagnosis not present

## 2018-01-14 DIAGNOSIS — Z170 Estrogen receptor positive status [ER+]: Secondary | ICD-10-CM

## 2018-01-14 DIAGNOSIS — C773 Secondary and unspecified malignant neoplasm of axilla and upper limb lymph nodes: Secondary | ICD-10-CM

## 2018-01-14 DIAGNOSIS — Z5111 Encounter for antineoplastic chemotherapy: Secondary | ICD-10-CM

## 2018-01-14 DIAGNOSIS — C50412 Malignant neoplasm of upper-outer quadrant of left female breast: Secondary | ICD-10-CM | POA: Diagnosis not present

## 2018-01-14 DIAGNOSIS — R197 Diarrhea, unspecified: Secondary | ICD-10-CM | POA: Diagnosis not present

## 2018-01-14 DIAGNOSIS — B370 Candidal stomatitis: Secondary | ICD-10-CM

## 2018-01-14 DIAGNOSIS — Z1501 Genetic susceptibility to malignant neoplasm of breast: Secondary | ICD-10-CM

## 2018-01-14 DIAGNOSIS — Z5112 Encounter for antineoplastic immunotherapy: Secondary | ICD-10-CM

## 2018-01-14 LAB — CBC WITH DIFFERENTIAL/PLATELET
Basophils Absolute: 0 10*3/uL (ref 0–0.1)
Basophils Relative: 0 %
Eosinophils Absolute: 0 10*3/uL (ref 0–0.7)
Eosinophils Relative: 0 %
HCT: 36.7 % (ref 35.0–47.0)
Hemoglobin: 12.7 g/dL (ref 12.0–16.0)
Lymphocytes Relative: 5 %
Lymphs Abs: 0.6 10*3/uL — ABNORMAL LOW (ref 1.0–3.6)
MCH: 28.9 pg (ref 26.0–34.0)
MCHC: 34.5 g/dL (ref 32.0–36.0)
MCV: 83.8 fL (ref 80.0–100.0)
Monocytes Absolute: 0.2 10*3/uL (ref 0.2–0.9)
Monocytes Relative: 1 %
Neutro Abs: 12.8 10*3/uL — ABNORMAL HIGH (ref 1.4–6.5)
Neutrophils Relative %: 94 %
Platelets: 392 10*3/uL (ref 150–440)
RBC: 4.38 MIL/uL (ref 3.80–5.20)
RDW: 14.1 % (ref 11.5–14.5)
WBC: 13.6 10*3/uL — ABNORMAL HIGH (ref 3.6–11.0)

## 2018-01-14 LAB — COMPREHENSIVE METABOLIC PANEL
ALT: 66 U/L — ABNORMAL HIGH (ref 0–44)
AST: 45 U/L — ABNORMAL HIGH (ref 15–41)
Albumin: 4.2 g/dL (ref 3.5–5.0)
Alkaline Phosphatase: 67 U/L (ref 38–126)
Anion gap: 12 (ref 5–15)
BUN: 12 mg/dL (ref 6–20)
CO2: 20 mmol/L — ABNORMAL LOW (ref 22–32)
Calcium: 9.6 mg/dL (ref 8.9–10.3)
Chloride: 104 mmol/L (ref 98–111)
Creatinine, Ser: 0.72 mg/dL (ref 0.44–1.00)
GFR calc Af Amer: >60 mL/min (ref >60)
GFR calc non Af Amer: >60 mL/min (ref >60)
Glucose, Bld: 263 mg/dL — ABNORMAL HIGH (ref 70–99)
Potassium: 4 mmol/L (ref 3.5–5.1)
Sodium: 136 mmol/L (ref 135–145)
Total Bilirubin: 0.9 mg/dL (ref 0.3–1.2)
Total Protein: 7.7 g/dL (ref 6.5–8.1)

## 2018-01-14 LAB — PREGNANCY, URINE: Preg Test, Ur: NEGATIVE

## 2018-01-14 LAB — MAGNESIUM: Magnesium: 1.9 mg/dL (ref 1.7–2.4)

## 2018-01-14 MED ORDER — SODIUM CHLORIDE 0.9 % IV SOLN
420.0000 mg | Freq: Once | INTRAVENOUS | Status: AC
  Administered 2018-01-14: 420 mg via INTRAVENOUS
  Filled 2018-01-14: qty 14

## 2018-01-14 MED ORDER — HEPARIN SOD (PORK) LOCK FLUSH 100 UNIT/ML IV SOLN
500.0000 [IU] | Freq: Once | INTRAVENOUS | Status: AC | PRN
  Administered 2018-01-14: 500 [IU]
  Filled 2018-01-14: qty 5

## 2018-01-14 MED ORDER — PALONOSETRON HCL INJECTION 0.25 MG/5ML
0.2500 mg | Freq: Once | INTRAVENOUS | Status: AC
  Administered 2018-01-14: 0.25 mg via INTRAVENOUS
  Filled 2018-01-14: qty 5

## 2018-01-14 MED ORDER — SODIUM CHLORIDE 0.9 % IV SOLN
450.0000 mg | Freq: Once | INTRAVENOUS | Status: AC
  Administered 2018-01-14: 450 mg via INTRAVENOUS
  Filled 2018-01-14: qty 21.43

## 2018-01-14 MED ORDER — ACETAMINOPHEN 325 MG PO TABS
650.0000 mg | ORAL_TABLET | Freq: Once | ORAL | Status: AC
  Administered 2018-01-14: 650 mg via ORAL
  Filled 2018-01-14: qty 2

## 2018-01-14 MED ORDER — SODIUM CHLORIDE 0.9 % IV SOLN
870.0000 mg | Freq: Once | INTRAVENOUS | Status: AC
  Administered 2018-01-14: 870 mg via INTRAVENOUS
  Filled 2018-01-14: qty 87

## 2018-01-14 MED ORDER — DIPHENHYDRAMINE HCL 25 MG PO CAPS
50.0000 mg | ORAL_CAPSULE | Freq: Once | ORAL | Status: AC
  Administered 2018-01-14: 50 mg via ORAL
  Filled 2018-01-14: qty 2

## 2018-01-14 MED ORDER — SODIUM CHLORIDE 0.9% FLUSH
10.0000 mL | INTRAVENOUS | Status: DC | PRN
  Filled 2018-01-14: qty 10

## 2018-01-14 MED ORDER — SODIUM CHLORIDE 0.9 % IV SOLN
75.0000 mg/m2 | Freq: Once | INTRAVENOUS | Status: AC
  Administered 2018-01-14: 150 mg via INTRAVENOUS
  Filled 2018-01-14: qty 15

## 2018-01-14 MED ORDER — SODIUM CHLORIDE 0.9 % IV SOLN
Freq: Once | INTRAVENOUS | Status: AC
  Administered 2018-01-14: 11:00:00 via INTRAVENOUS
  Filled 2018-01-14: qty 1000

## 2018-01-14 MED ORDER — SODIUM CHLORIDE 0.9 % IV SOLN
Freq: Once | INTRAVENOUS | Status: AC
  Administered 2018-01-14: 11:00:00 via INTRAVENOUS
  Filled 2018-01-14: qty 5

## 2018-01-14 NOTE — Progress Notes (Signed)
Patient states she took a Tylenol yesterday for a headache.  Otherwise, offers no complaints  Patient needs clarification on her steroid.

## 2018-01-14 NOTE — Progress Notes (Signed)
Per Honor Loh NP okay to proceed with treatment with HR of 118.

## 2018-01-15 ENCOUNTER — Inpatient Hospital Stay: Payer: BC Managed Care – PPO

## 2018-01-15 DIAGNOSIS — C50412 Malignant neoplasm of upper-outer quadrant of left female breast: Secondary | ICD-10-CM | POA: Diagnosis not present

## 2018-01-15 DIAGNOSIS — Z170 Estrogen receptor positive status [ER+]: Secondary | ICD-10-CM

## 2018-01-15 DIAGNOSIS — B370 Candidal stomatitis: Secondary | ICD-10-CM

## 2018-01-15 MED ORDER — PEGFILGRASTIM-CBQV 6 MG/0.6ML ~~LOC~~ SOSY
6.0000 mg | PREFILLED_SYRINGE | Freq: Once | SUBCUTANEOUS | Status: AC
  Administered 2018-01-15: 6 mg via SUBCUTANEOUS

## 2018-01-24 ENCOUNTER — Encounter: Payer: Self-pay | Admitting: Urgent Care

## 2018-02-04 ENCOUNTER — Encounter: Payer: Self-pay | Admitting: Hematology and Oncology

## 2018-02-04 ENCOUNTER — Inpatient Hospital Stay: Payer: BC Managed Care – PPO

## 2018-02-04 ENCOUNTER — Inpatient Hospital Stay: Payer: BC Managed Care – PPO | Attending: Hematology and Oncology

## 2018-02-04 ENCOUNTER — Inpatient Hospital Stay (HOSPITAL_BASED_OUTPATIENT_CLINIC_OR_DEPARTMENT_OTHER): Payer: BC Managed Care – PPO | Admitting: Hematology and Oncology

## 2018-02-04 ENCOUNTER — Encounter: Payer: Self-pay | Admitting: *Deleted

## 2018-02-04 VITALS — BP 123/85 | HR 111 | Temp 96.6°F | Resp 18 | Wt 183.3 lb

## 2018-02-04 DIAGNOSIS — R945 Abnormal results of liver function studies: Secondary | ICD-10-CM

## 2018-02-04 DIAGNOSIS — Z170 Estrogen receptor positive status [ER+]: Principal | ICD-10-CM

## 2018-02-04 DIAGNOSIS — R110 Nausea: Secondary | ICD-10-CM | POA: Insufficient documentation

## 2018-02-04 DIAGNOSIS — Z5112 Encounter for antineoplastic immunotherapy: Secondary | ICD-10-CM

## 2018-02-04 DIAGNOSIS — C773 Secondary and unspecified malignant neoplasm of axilla and upper limb lymph nodes: Secondary | ICD-10-CM

## 2018-02-04 DIAGNOSIS — Z5189 Encounter for other specified aftercare: Secondary | ICD-10-CM | POA: Insufficient documentation

## 2018-02-04 DIAGNOSIS — Z1501 Genetic susceptibility to malignant neoplasm of breast: Secondary | ICD-10-CM

## 2018-02-04 DIAGNOSIS — Z5111 Encounter for antineoplastic chemotherapy: Secondary | ICD-10-CM | POA: Insufficient documentation

## 2018-02-04 DIAGNOSIS — C50412 Malignant neoplasm of upper-outer quadrant of left female breast: Secondary | ICD-10-CM

## 2018-02-04 DIAGNOSIS — R7989 Other specified abnormal findings of blood chemistry: Secondary | ICD-10-CM | POA: Diagnosis not present

## 2018-02-04 LAB — COMPREHENSIVE METABOLIC PANEL
ALT: 146 U/L — ABNORMAL HIGH (ref 0–44)
ALT: 149 U/L — ABNORMAL HIGH (ref 0–44)
AST: 69 U/L — ABNORMAL HIGH (ref 15–41)
AST: 62 U/L — ABNORMAL HIGH (ref 15–41)
Albumin: 4 g/dL (ref 3.5–5.0)
Albumin: 4.3 g/dL (ref 3.5–5.0)
Alkaline Phosphatase: 68 U/L (ref 38–126)
Alkaline Phosphatase: 65 U/L (ref 38–126)
Anion gap: 12 (ref 5–15)
Anion gap: 8 (ref 5–15)
BUN: 12 mg/dL (ref 6–20)
BUN: 11 mg/dL (ref 6–20)
CO2: 20 mmol/L — ABNORMAL LOW (ref 22–32)
CO2: 23 mmol/L (ref 22–32)
Calcium: 9.4 mg/dL (ref 8.9–10.3)
Calcium: 9.6 mg/dL (ref 8.9–10.3)
Chloride: 106 mmol/L (ref 98–111)
Chloride: 107 mmol/L (ref 98–111)
Creatinine, Ser: 0.76 mg/dL (ref 0.44–1.00)
Creatinine, Ser: 0.57 mg/dL (ref 0.44–1.00)
GFR calc Af Amer: >60 mL/min (ref >60)
GFR calc Af Amer: >60 mL/min (ref >60)
GFR calc non Af Amer: >60 mL/min (ref >60)
GFR calc non Af Amer: >60 mL/min (ref >60)
Glucose, Bld: 291 mg/dL — ABNORMAL HIGH (ref 70–99)
Glucose, Bld: 166 mg/dL — ABNORMAL HIGH (ref 70–99)
Potassium: 4 mmol/L (ref 3.5–5.1)
Potassium: 3.9 mmol/L (ref 3.5–5.1)
Sodium: 138 mmol/L (ref 135–145)
Sodium: 138 mmol/L (ref 135–145)
Total Bilirubin: 0.8 mg/dL (ref 0.3–1.2)
Total Bilirubin: 0.8 mg/dL (ref 0.3–1.2)
Total Protein: 7 g/dL (ref 6.5–8.1)
Total Protein: 7.4 g/dL (ref 6.5–8.1)

## 2018-02-04 LAB — CBC WITH DIFFERENTIAL/PLATELET
Basophils Absolute: 0 10*3/uL (ref 0–0.1)
Basophils Relative: 0 %
Eosinophils Absolute: 0 10*3/uL (ref 0–0.7)
Eosinophils Relative: 0 %
HCT: 33.4 % — ABNORMAL LOW (ref 35.0–47.0)
Hemoglobin: 11.7 g/dL — ABNORMAL LOW (ref 12.0–16.0)
Lymphocytes Relative: 6 %
Lymphs Abs: 0.6 10*3/uL — ABNORMAL LOW (ref 1.0–3.6)
MCH: 30.1 pg (ref 26.0–34.0)
MCHC: 35 g/dL (ref 32.0–36.0)
MCV: 85.9 fL (ref 80.0–100.0)
Monocytes Absolute: 0.1 10*3/uL — ABNORMAL LOW (ref 0.2–0.9)
Monocytes Relative: 1 %
Neutro Abs: 9.6 10*3/uL — ABNORMAL HIGH (ref 1.4–6.5)
Neutrophils Relative %: 93 %
Platelets: 268 10*3/uL (ref 150–440)
RBC: 3.88 MIL/uL (ref 3.80–5.20)
RDW: 14.4 % (ref 11.5–14.5)
WBC: 10.3 10*3/uL (ref 3.6–11.0)

## 2018-02-04 LAB — MAGNESIUM: Magnesium: 1.7 mg/dL (ref 1.7–2.4)

## 2018-02-04 LAB — GAMMA GT: GGT: 56 U/L — AB (ref 7–50)

## 2018-02-04 LAB — PREGNANCY, URINE: Preg Test, Ur: NEGATIVE

## 2018-02-04 MED ORDER — HEPARIN SOD (PORK) LOCK FLUSH 100 UNIT/ML IV SOLN
INTRAVENOUS | Status: AC
  Filled 2018-02-04: qty 5

## 2018-02-04 MED ORDER — HEPARIN SOD (PORK) LOCK FLUSH 100 UNIT/ML IV SOLN
500.0000 [IU] | Freq: Once | INTRAVENOUS | Status: AC
  Administered 2018-02-04: 500 [IU] via INTRAVENOUS

## 2018-02-04 MED ORDER — SODIUM CHLORIDE 0.9% FLUSH
10.0000 mL | Freq: Once | INTRAVENOUS | Status: AC
  Administered 2018-02-04: 10 mL via INTRAVENOUS
  Filled 2018-02-04: qty 10

## 2018-02-04 NOTE — Progress Notes (Signed)
Patient states over the past few days her eyes have been watering a lot and her nose running.  Otherwise, offers no complaints.

## 2018-02-04 NOTE — Progress Notes (Signed)
[facility] day:  02/04/18  Chief Complaint: Denise [name] is a 42 y.o. female with multi-focal Her2/neu + left breast cancer who is seen for assessment prior to cycle #3 neoadjuvant TCHP with Udencya support.   HPI: The patient was last seen in the medical oncology clinic on 01/14/2018. At that time, she was doing well. Her post treatment symptoms had fully resolved. She denied nausea, vomiting, diarrhea, and pain. Oral candidiasis had resolved. She received cycle #3 TCHP with Udencya support.  During the interim, patient is doing well overall. She notes that the pain was "more intense" and "lasted longer". She is using the prescribed pain medications, which have been effective in managing her pain. Patient experienced a recurrence of previously treated oral candidiasis. Nausea has been well controlled. Patient denies that she has experienced any B symptoms. She denies any interval infections.   Patient advises that she maintains an adequate appetite. She is eating well. Weight today is 183 lb 5 oz (83.2 kg), which compared to her last visit to the clinic, represents a 3 pound increase.  Patient denies alcohol intake. She has never had hepatitis. She notes that she was going to donate blood to her friend with cancer 11 years ago. Results came back that she was unable to done because of concerns over "some hepatitis thing". Patient denies the use of any new medications or herbal products.   Patient denies pain in the clinic today.   Past Medical History:  Diagnosis Date  . Abnormal breast biopsy    [facility], [name]  . Family history of breast cancer   . Family history of ovarian cancer   . Fibroadenoma of breast, left 2000, 2016  . Genetic screening 08/26/2013   BRCA/BART negative/[facility], CHEK2 POS 2017  . Increased risk of breast cancer 2017   IBIS=47%  . Left breast lump 06/18/2017   Fluid/drained [name]  . Migraine   . Monoallelic  mutation of CHEK2 gene in female patient 2017   increased risk of breast and colon cancer    Past Surgical History:  Procedure Laterality Date  . BREAST BIOPSY Bilateral 09/08/14   neg  . BREAST BIOPSY Left 03/16/2015   Procedure: BREAST BIOPSY WITH NEEDLE LOCALIZATION;  Surgeon: [name], MD;  Location: [facility] ORS;  Service: General;  Laterality: Left;  . BREAST CYST ASPIRATION Left 06/18/2017   cyst aspiration  . BREAST EXCISIONAL BIOPSY Left 09/2014  . BREAST EXCISIONAL BIOPSY Right    age 83's  . BREAST SURGERY Right March 2000   benign fibroadenoma  . BREAST SURGERY Left 08/08/13   excision  . BREAST SURGERY Left 09/23/14   excision  . PORTACATH PLACEMENT Right 12/17/2017   Procedure: INSERTION PORT-A-CATH;  Surgeon: [name], MD;  Location: [facility] ORS;  Service: General;  Laterality: Right;    Family History  Problem Relation Age of Onset  . Prostate cancer Father 87  . Breast cancer Paternal Grandmother 6  . Breast cancer Maternal Aunt 60  . Ovarian cancer Maternal Aunt 70  . Breast cancer Paternal Aunt 63  . Pancreatic cancer Maternal Aunt 75    Social History:  reports that she has never smoked. She has never used smokeless tobacco. She reports that she drinks alcohol. She reports that she does not use drugs.  She does not smoke. She "occasionally" drinks alcohol. 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 Patient denies known exposures to  radiation on toxins. Her husband's name is [name]. She has 2 daughters, [name] and [name] (ages 80 1/2 and 33).  The patient is accompanied by her husband, [name], today.  Allergies:  Allergies  Allergen Reactions  . Tape     Minor skin irritation     Current Medications: Current Outpatient Medications  Medication Sig Dispense Refill  . dexamethasone (DECADRON) 4 MG tablet Take 2 tablets (8 mg total) by mouth 2 (two) times daily. Start the day before Taxotere. Take once the day after, then 2 times  a day x 2d. 30 tablet 1  . HYDROcodone-acetaminophen (NORCO/VICODIN) 5-325 MG tablet Take 1 tablet by mouth every 6 (six) hours as needed for moderate pain. 20 tablet 0  . lidocaine-prilocaine (EMLA) cream Apply to affected area once 30 g 3  . naproxen sodium (ALEVE) 220 MG tablet Take 440 mg by mouth daily as needed (pain).    . nystatin (MYCOSTATIN) 100000 UNIT/ML suspension Swish and swallow 5 cc QID x 5 days, then PRN. 240 mL 0  . ondansetron (ZOFRAN) 8 MG tablet Take 1 tablet (8 mg total) by mouth every 8 (eight) hours as needed (Nausea or vomiting). 30 tablet 3  . prochlorperazine (COMPAZINE) 10 MG tablet Take 1 tablet (10 mg total) by mouth every 6 (six) hours as needed (Nausea or vomiting). 30 tablet 1   No current facility-administered medications for this visit.     Review of Systems  Constitutional: Negative for diaphoresis, fever, malaise/fatigue and weight loss (weight up 3 pounds).       "I feel pretty good overall".   HENT: Negative.        Xerostomia - improved. (+) recurrence of oral candidiasis.   Eyes: Negative.        Mild tearing  Respiratory: Negative for cough, hemoptysis, sputum production and shortness of breath.   Cardiovascular: Negative for chest pain, palpitations, orthopnea, leg swelling and PND.  Gastrointestinal: Positive for nausea (controlled). Negative for abdominal pain, blood in stool, constipation, diarrhea, melena and vomiting.  Genitourinary: Positive for hematuria. Negative for dysuria, frequency and urgency.  Musculoskeletal: Negative for back pain, falls, joint pain and myalgias.       (+) bone pain following Udencya.  Skin: Negative for itching and rash.       Power port to RIGHT chest wall  Neurological: Negative for dizziness, tremors, weakness and headaches.  Endo/Heme/Allergies: Does not bruise/bleed easily.  Psychiatric/Behavioral: Negative for depression, memory loss and suicidal ideas. The patient is not nervous/anxious and does not have  insomnia.   All other systems reviewed and are negative.  Performance status (ECOG): 1 - Symptomatic but completely ambulatory  Vital Signs BP 123/85 (BP Location: Left Arm, Patient Position: Sitting)   Pulse (!) 111   Temp (!) 96.6 F (35.9 C) (Tympanic)   Resp 18   Wt 183 lb 5 oz (83.2 kg)   BMI 28.71 kg/m   Physical Exam  Constitutional: She is oriented to person, place, and time and well-developed, well-nourished, and in no distress.  HENT:  Head: Normocephalic and atraumatic.  Wearing a scarf.  Eyes: Pupils are equal, round, and reactive to light. EOM are normal. No scleral icterus.  Neck: Normal range of motion. Neck supple. No tracheal deviation present. No thyromegaly present.  Cardiovascular: Normal rate, regular rhythm and normal heart sounds. Exam reveals no gallop and no friction rub.  No murmur heard. Pulmonary/Chest: Effort normal and breath sounds normal. No respiratory distress. She has no wheezes. She has  no rales.  Power port to RIGHT chest wall  Abdominal: Soft. Bowel sounds are normal. She exhibits no distension. There is no tenderness.  Musculoskeletal: Normal range of motion. She exhibits no edema or tenderness.  Lymphadenopathy:    She has no cervical adenopathy.    She has no axillary adenopathy.       Right: No inguinal and no supraclavicular adenopathy present.       Left: No inguinal and no supraclavicular adenopathy present.  Neurological: She is alert and oriented to person, place, and time.  Skin: Skin is warm and dry. No rash noted. No erythema.  Psychiatric: Mood, affect and judgment normal.  Nursing note and vitals reviewed.   Appointment on 02/04/2018  Component Date Value Ref Range Status  . Sodium 02/04/2018 138  135 - 145 mmol/L Final  . Potassium 02/04/2018 3.9  3.5 - 5.1 mmol/L Final  . Chloride 02/04/2018 107  98 - 111 mmol/L Final  . CO2 02/04/2018 23  22 - 32 mmol/L Final  . Glucose, Bld 02/04/2018 166* 70 - 99 mg/dL Final  . BUN  02/04/2018 11  6 - 20 mg/dL Final  . Creatinine, Ser 02/04/2018 0.57  0.44 - 1.00 mg/dL Final  . Calcium 02/04/2018 9.6  8.9 - 10.3 mg/dL Final  . Total Protein 02/04/2018 7.4  6.5 - 8.1 g/dL Final  . Albumin 02/04/2018 4.3  3.5 - 5.0 g/dL Final  . AST 02/04/2018 62* 15 - 41 U/L Final  . ALT 02/04/2018 149* 0 - 44 U/L Final  . Alkaline Phosphatase 02/04/2018 65  38 - 126 U/L Final  . Total Bilirubin 02/04/2018 0.8  0.3 - 1.2 mg/dL Final  . GFR calc non Af Amer 02/04/2018 >60  >60 mL/min Final  . GFR calc Af Amer 02/04/2018 >60  >60 mL/min Final   Comment: (NOTE) The eGFR has been calculated using the CKD EPI equation. This calculation has not been validated in all clinical situations. eGFR's persistently <60 mL/min signify possible Chronic Kidney Disease.   Georgiann Hahn gap 02/04/2018 8  5 - 15 Final   Performed at [facility], [address] [place], [place] [address]  . GGT 02/04/2018 56* 7 - 50 U/L Final   Performed at [facility] [address], [place], [place] [address]  Appointment on 02/04/2018  Component Date Value Ref Range Status  . Magnesium 02/04/2018 1.7  1.7 - 2.4 mg/dL Final   Performed at [facility], [address] [place], [place] [address]  . Sodium 02/04/2018 138  135 - 145 mmol/L Final  . Potassium 02/04/2018 4.0  3.5 - 5.1 mmol/L Final  . Chloride 02/04/2018 106  98 - 111 mmol/L Final  . CO2 02/04/2018 20* 22 - 32 mmol/L Final  . Glucose, Bld 02/04/2018 291* 70 - 99 mg/dL Final  . BUN 02/04/2018 12  6 - 20 mg/dL Final  . Creatinine, Ser 02/04/2018 0.76  0.44 - 1.00 mg/dL Final  . Calcium 02/04/2018 9.4  8.9 - 10.3 mg/dL Final  . Total Protein 02/04/2018 7.0  6.5 - 8.1 g/dL Final  . Albumin 02/04/2018 4.0  3.5 - 5.0 g/dL Final  . AST 02/04/2018 69* 15 - 41 U/L Final  . ALT 02/04/2018 146* 0 - 44 U/L Final  . Alkaline Phosphatase 02/04/2018 68  38 - 126 U/L Final  . Total Bilirubin 02/04/2018 0.8  0.3 - 1.2 mg/dL Final  . GFR calc non  Af Amer 02/04/2018 >60  >60  mL/min Final  . GFR calc Af Amer 02/04/2018 >60  >60 mL/min Final   Comment: (NOTE) The eGFR has been calculated using the CKD EPI equation. This calculation has not been validated in all clinical situations. eGFR's persistently <60 mL/min signify possible Chronic Kidney Disease.   Georgiann Hahn gap 02/04/2018 12  5 - 15 Final   Performed at [facility], [address] [place], [place] [address]  . WBC 02/04/2018 10.3  3.6 - 11.0 K/uL Final  . RBC 02/04/2018 3.88  3.80 - 5.20 MIL/uL Final  . Hemoglobin 02/04/2018 11.7* 12.0 - 16.0 g/dL Final  . HCT 02/04/2018 33.4* 35.0 - 47.0 % Final  . MCV 02/04/2018 85.9  80.0 - 100.0 fL Final  . MCH 02/04/2018 30.1  26.0 - 34.0 pg Final  . MCHC 02/04/2018 35.0  32.0 - 36.0 g/dL Final  . RDW 02/04/2018 14.4  11.5 - 14.5 % Final  . Platelets 02/04/2018 268  150 - 440 K/uL Final  . Neutrophils Relative % 02/04/2018 93  % Final  . Neutro Abs 02/04/2018 9.6* 1.4 - 6.5 K/uL Final  . Lymphocytes Relative 02/04/2018 6  % Final  . Lymphs Abs 02/04/2018 0.6* 1.0 - 3.6 K/uL Final  . Monocytes Relative 02/04/2018 1  % Final  . Monocytes Absolute 02/04/2018 0.1* 0.2 - 0.9 K/uL Final  . Eosinophils Relative 02/04/2018 0  % Final  . Eosinophils Absolute 02/04/2018 0.0  0 - 0.7 K/uL Final  . Basophils Relative 02/04/2018 0  % Final  . Basophils Absolute 02/04/2018 0.0  0 - 0.1 K/uL Final   Performed at [facility], [address], [place], [place] [address]  . Preg Test, Ur 02/04/2018 NEGATIVE  NEGATIVE Final   Performed at [facility], [address] [place], [place] [address]    Assessment:  KYLEAH PENSABENE is a 42 y.o. female with multi-focal stage IB Her2/neu + left breast cancer s/p index breast mass and axillary node biopsy on 12/06/2017.  Pathology revealed grade II invasive ductal carcinoma with calcifications at the 11 o'clock position.  There was metastatic carcinoma in 1 of 1 lymph nodes.  Tumor  was ER + (100%), PR + (100%), and Ki67 5%.  Her2/neu was 3+ by IHC (heterogeneous) and FISH +.  Diagnostic left mammogram and ultrasound on 12/06/2017 revealed a 1.7 x 1.3 x 1.3 cm irregular hypoechoic mass with internal calcifications at the 11 o'clock position 2 cm from the nipple with internal calcifications. There was a similar appearing 0.9 x 0.8 x 0.7 cm mass at the 11 o'clock position 4 cm from the nipple (2.4 cm from the index lesion).  There were suspicious, segmental calcifications originating from the index lesion and extending 5 cm posteriorly.  There was a 0.6 cm morphologically abnormal left axillary lymph node.  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 The adjacent and more superiorly located 11 o'clock mass (7 x 12 mm) seen on recent ultrasound, 4 cm from the nipple on ultrasound, was also identified. There were 2 probable satellite lesions (7 mm, medial lesion; posterior and lateral lesion). The total span of malignancy was 3 x 3.1 x 4.5 cm.  There were calcifications extending up to 5 cm posterior to the biopsied malignancy which were highly suspicious on mammography but not appreciated.  There was a mass in the lower outer left breast  measured 5 mm, representing a change.  The known metastatic node in the left axilla was identified. A node along the posterolateral margin of the pectoralis minor (cortex 5 mm) was nonspecific but at least somewhat suspicious.  Chest, abdomen, and pelvic CT on 12/19/2017 revealed a solitary enlarged biopsy-proven metastatic left axillary lymph node.  There were no additional findings suspicious for metastatic disease in the chest, abdomen or pelvis.  There was a nonspecific tiny sclerotic upper right sacral lesion, more likely a benign bone island. Bone scintigraphy correlation versus follow-up CT could be considered.  Bone scan on  01/11/2018 was negative for metastatic pattern. No areas of focal tracer uptake. Area of concern in sacrum on CT likely represents benign bone island.   [facility] genetic testing on 08/26/2013 was negative for BRCA1 and 2. 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  She has a family history significant for breast, ovarian, pancreatic, and prostate cancer.  She is s/p 2 cycles of TCHP chemotherapy (12/21/2017 - 01/14/2018) with Neulasta support. She is tolerating treatment well.  Symptomatically, patient is doing well overall. She had more Neulasta induced pain with this last cycle. Nausea was well controlled. She has had a recurrence of oral candidiasis for which she was using Nystatin mouthwash. Exam is grossly unremarkable. WBC 10,300 ([place] [address]). Glucose 291 (steroid induced). AST 69 and ALT 146.  Plan: 1. Labs today:  CBC with diff, CMP, Mg, HBV sAg, HBV cAb, HCV Ab, GGT 2. Breast cancer - treatment ongoing  Doing well. Tolerating treatment with minimal side effects.  Labs reviewed. Blood counts stable and adequate enough for treatment, however there has been an acute elevation in her LFTs. Will postpone cycle #3 TCHP. Discuss symptom management.  Patient has antiemetics and pain medications at home to use on a PRN basis. Patient  advising that the  prescribed interventions are adequate at this point. Continue all medications as previously prescribed. 3. Elevated LFTs - acute  AST 69 and ALT 146.  Will had HBV sAg, HBV cAb, HCV Ab to further assess.  If negative, check abdominal ultrasound. 4. Oral candidiasis - recurrent  Related to systemic steroids.  Using PRN antifungal mouthwash as prescribed.  5. RTC on 02/11/2018 for MD assessment, labs (CBC with diff, CMP, Mg), and cycle #3 TCHP follow by Margarette Canada the next day.   Honor Loh, NP  02/04/18, 1:40 PM   I saw and evaluated the patient, participating in the key portions of the service and reviewing  pertinent diagnostic studies and records.  I reviewed the nurse practitioner's note and agree with the findings and the plan.  The assessment and plan were discussed with the patient.  Several questions were asked by the patient and answered.   Nolon Stalls, MD 02/04/2018,1:40 PM

## 2018-02-04 NOTE — Progress Notes (Signed)
No treatment today per Dr. Mike Gip.  Extra lab tubes obtained as ordered.  Pt left infusion suite stable and ambulatory.

## 2018-02-05 ENCOUNTER — Other Ambulatory Visit: Payer: Self-pay | Admitting: Urgent Care

## 2018-02-05 ENCOUNTER — Encounter: Payer: Self-pay | Admitting: Urgent Care

## 2018-02-05 ENCOUNTER — Inpatient Hospital Stay: Payer: BC Managed Care – PPO

## 2018-02-05 DIAGNOSIS — R1011 Right upper quadrant pain: Secondary | ICD-10-CM

## 2018-02-05 DIAGNOSIS — R7989 Other specified abnormal findings of blood chemistry: Secondary | ICD-10-CM

## 2018-02-05 DIAGNOSIS — R945 Abnormal results of liver function studies: Principal | ICD-10-CM

## 2018-02-05 LAB — HEPATITIS C ANTIBODY: HCV Ab: <0.1 s/co ratio (ref 0.0–0.9)

## 2018-02-05 LAB — HEPATITIS B SURFACE ANTIGEN: Hepatitis B Surface Ag: NEGATIVE

## 2018-02-05 LAB — HEPATITIS B CORE ANTIBODY, TOTAL: HEP B C TOTAL AB: NEGATIVE

## 2018-02-06 ENCOUNTER — Inpatient Hospital Stay: Payer: BC Managed Care – PPO

## 2018-02-06 ENCOUNTER — Ambulatory Visit
Admission: RE | Admit: 2018-02-06 | Discharge: 2018-02-06 | Disposition: A | Discharge status: Home / Self Care | Payer: BC Managed Care – PPO | Source: Ambulatory Visit | Attending: Urgent Care | Admitting: Urgent Care

## 2018-02-06 ENCOUNTER — Telehealth: Payer: Self-pay | Admitting: *Deleted

## 2018-02-06 ENCOUNTER — Encounter: Payer: Self-pay | Admitting: Urgent Care

## 2018-02-06 DIAGNOSIS — R945 Abnormal results of liver function studies: Principal | ICD-10-CM

## 2018-02-06 DIAGNOSIS — R1011 Right upper quadrant pain: Secondary | ICD-10-CM

## 2018-02-06 DIAGNOSIS — K824 Cholesterolosis of gallbladder: Secondary | ICD-10-CM | POA: Insufficient documentation

## 2018-02-06 DIAGNOSIS — C50412 Malignant neoplasm of upper-outer quadrant of left female breast: Secondary | ICD-10-CM | POA: Diagnosis not present

## 2018-02-06 DIAGNOSIS — R7989 Other specified abnormal findings of blood chemistry: Secondary | ICD-10-CM

## 2018-02-06 LAB — HEPATIC FUNCTION PANEL
ALBUMIN: 4 g/dL (ref 3.5–5.0)
ALT: 108 U/L — AB (ref 0–44)
AST: 33 U/L (ref 15–41)
Alkaline Phosphatase: 59 U/L (ref 38–126)
BILIRUBIN INDIRECT: 0.9 mg/dL (ref 0.3–0.9)
Bilirubin, Direct: 0.1 mg/dL (ref 0.0–0.2)
TOTAL PROTEIN: 6.7 g/dL (ref 6.5–8.1)
Total Bilirubin: 1 mg/dL (ref 0.3–1.2)

## 2018-02-06 LAB — AMYLASE: AMYLASE: 66 U/L (ref 28–100)

## 2018-02-06 LAB — LIPASE, BLOOD: Lipase: 30 U/L (ref 11–51)

## 2018-02-06 NOTE — Telephone Encounter (Signed)
I have already relayed the results to the patient regarding labs and [place]. [place] (-), amylase and lipase (-), and LFTs are improving.

## 2018-02-06 NOTE — Telephone Encounter (Signed)
Called report:  IMPRESSION: Small anterior gallbladder wall polyp. No gallstones or evidence of acute cholecystitis.  No acute findings.   Electronically Signed   By: Rolm Baptise M.D.   On: 02/06/2018 09:33

## 2018-02-10 NOTE — Progress Notes (Signed)
[facility] day:  02/11/18  Chief Complaint: Denise [name] is a 42 y.o. female with multi-focal Her2/neu + left breast cancer who is seen for assessment prior to cycle #3 neoadjuvant TCHP with Udencya support.   HPI: The patient was last seen in the medical oncology clinic on 02/04/2018. At that time, patient was doing well overall. She noted that she experienced more pain with this cycles following her Udencya injection. Pain well controlled with the prescribed interventions. She experienced a recurrence of oral candidiasis. Nausea controlled. Appetite stable; weight up 3 pounds. Exam was unremarkable. WBC 10,300 ([place] 9600). Glucose 291 (steriods induced). LFTs elevated; AST 69 and ALT 146. Cycle #3 TCHP was postponed due to acute elevation in LFTs. Additional lab studies were ordered.   In response of elevated LFTs, hepatitis serologies were checked on 02/04/2018. HBV sAg (-), HBV cAb (-), and HCV Ab (-). There was a mild elevation in her GGT level to 56 (7 - 50 U/L).  Patient contacted the clinic on 02/05/2018 reporting pain in the RIGHT upper quadrant of her abdomen. She denies increased nausea or vomiting. No fevers. Symptom not felt to be related to dietary intake, as she diet greasy/fatty/spicy/excess dairy intake. Given her acutely elevated LFTs, the decision was made to send patient for further lab testing and ultrasound imaging to assess of biliary obstruction.   Additional lab studies were done on 02/06/2018 that revealed some improvement in her LFTs; AST 33 and ALT 108. Amylase and lipase levels were normal. Ultrasound imaging of the RIGHT upper quadrant revealed a small anterior gallbladder wall polyp. There was no evidence of cholelithiasis or cholecystis. CBD measured normal at 2 mm, with no evidence of choledocholithiasis. There was normal direction of blood flow towards the liver noted. Patient notified of results by clinic NP.   In the  interim, she has done well.  She denies any complaint.  She comments that she took her Decadron yesterday.  She also ate a 3 scoop ice cream banana split.  Blood sugar is elevated today.   Past Medical History:  Diagnosis Date  . Abnormal breast biopsy    [facility], [name]  . Family history of breast cancer   . Family history of ovarian cancer   . Fibroadenoma of breast, left 2000, 2016  . Genetic screening 08/26/2013   BRCA/BART negative/[facility], CHEK2 POS 2017  . Increased risk of breast cancer 2017   IBIS=47%  . Left breast lump 06/18/2017   Fluid/drained [name]  . Migraine   . Monoallelic mutation of CHEK2 gene in female patient 2017   increased risk of breast and colon cancer    Past Surgical History:  Procedure Laterality Date  . BREAST BIOPSY Bilateral 09/08/14   neg  . BREAST BIOPSY Left 03/16/2015   Procedure: BREAST BIOPSY WITH NEEDLE LOCALIZATION;  Surgeon: [name], MD;  Location: [facility] ORS;  Service: General;  Laterality: Left;  . BREAST CYST ASPIRATION Left 06/18/2017   cyst aspiration  . BREAST EXCISIONAL BIOPSY Left 09/2014  . BREAST EXCISIONAL BIOPSY Right    age 49's  . BREAST SURGERY Right March 2000   benign fibroadenoma  . BREAST SURGERY Left 08/08/13   excision  . BREAST SURGERY Left 09/23/14   excision  . PORTACATH PLACEMENT Right 12/17/2017   Procedure: INSERTION PORT-A-CATH;  Surgeon: [name], MD;  Location: [facility] ORS;  Service: General;  Laterality: Right;    Family History  Problem  Relation Age of Onset  . Prostate cancer Father 89  . Breast cancer Paternal Grandmother 46  . Breast cancer Maternal Aunt 60  . Ovarian cancer Maternal Aunt 70  . Breast cancer Paternal Aunt 42  . Pancreatic cancer Maternal Aunt 75    Social History:  reports that she has never smoked. She has never used smokeless tobacco. She reports that she drinks alcohol. She reports that she does not use drugs.  She does not smoke. She "occasionally" drinks  alcohol. 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 Patient denies known exposures to radiation on toxins. Her husband's name is [name]. She has 2 daughters, [name] and [name] (ages 44 1/2 and 43).  The patient is accompanied by her husband, [name], today.  Allergies:  Allergies  Allergen Reactions  . Tape     Minor skin irritation     Current Medications: Current Outpatient Medications  Medication Sig Dispense Refill  . dexamethasone (DECADRON) 4 MG tablet Take 2 tablets (8 mg total) by mouth 2 (two) times daily. Start the day before Taxotere. Take once the day after, then 2 times a day x 2d. 30 tablet 1  . HYDROcodone-acetaminophen (NORCO/VICODIN) 5-325 MG tablet Take 1 tablet by mouth every 6 (six) hours as needed for moderate pain. 20 tablet 0  . lidocaine-prilocaine (EMLA) cream Apply to affected area once 30 g 3  . naproxen sodium (ALEVE) 220 MG tablet Take 440 mg by mouth daily as needed (pain).    . nystatin (MYCOSTATIN) 100000 UNIT/ML suspension Swish and swallow 5 cc QID x 5 days, then PRN. 240 mL 0  . omeprazole (PRILOSEC) 10 MG capsule Take 10 mg by mouth daily.    . ondansetron (ZOFRAN) 8 MG tablet Take 1 tablet (8 mg total) by mouth every 8 (eight) hours as needed (Nausea or vomiting). 30 tablet 3  . prochlorperazine (COMPAZINE) 10 MG tablet Take 1 tablet (10 mg total) by mouth every 6 (six) hours as needed (Nausea or vomiting). 30 tablet 1   No current facility-administered medications for this visit.     Review of Systems  Constitutional: Negative for diaphoresis, fever, malaise/fatigue and weight loss (1 pound).       Feels good.  HENT: Negative.  Negative for congestion, ear pain, hearing loss, nosebleeds, sinus pain, sore throat and tinnitus.   Eyes: Negative.  Negative for blurred vision, double vision, pain and redness.  Respiratory: Negative.  Negative for cough, hemoptysis, sputum production and shortness of breath.   Cardiovascular: Negative.   Negative for chest pain, palpitations, orthopnea, leg swelling and PND.  Gastrointestinal: Negative.  Negative for abdominal pain, blood in stool, constipation, diarrhea, melena, nausea and vomiting.  Genitourinary: Negative.  Negative for dysuria, frequency, hematuria and urgency.  Musculoskeletal: Negative.  Negative for back pain, falls, joint pain, myalgias and neck pain.  Skin: Negative.  Negative for itching and rash.  Neurological: Negative.  Negative for dizziness, tremors, sensory change, speech change, focal weakness, weakness and headaches.  Endo/Heme/Allergies: Does not bruise/bleed easily.       Elevated blood sugar s/p Decadron and ice cream last night.  Psychiatric/Behavioral: Negative for depression, memory loss and suicidal ideas. The patient is not nervous/anxious and does not have insomnia.   All other systems reviewed and are negative.  Performance status (ECOG): 1 - Symptomatic but completely ambulatory  Vital Signs BP 124/85 (BP Location: Left Arm, Patient Position: Sitting)   Pulse (!) 103   Temp (!) 96.7 F (  35.9 C) (Tympanic)   Resp 18   Wt 182 lb 9 oz (82.8 kg)   BMI 28.59 kg/m   Physical Exam  Constitutional: She is oriented to person, place, and time and well-developed, well-nourished, and in no distress.  HENT:  Head: Normocephalic and atraumatic.  Wearing a scarf.  Alopecia.  Eyes: Pupils are equal, round, and reactive to light. EOM are normal. No scleral icterus.  Neck: Normal range of motion. Neck supple. No tracheal deviation present. No thyromegaly present.  Cardiovascular: Normal rate, regular rhythm and normal heart sounds. Exam reveals no gallop and no friction rub.  No murmur heard. Pulmonary/Chest: Effort normal and breath sounds normal. No respiratory distress. She has no wheezes. She has no rales.  Power port to RIGHT chest wall  Abdominal: Soft. Bowel sounds are normal. She exhibits no distension. There is no tenderness.  Musculoskeletal:  Normal range of motion. She exhibits no edema or tenderness.  Lymphadenopathy:    She has no cervical adenopathy.    She has no axillary adenopathy.       Right: No inguinal and no supraclavicular adenopathy present.       Left: No inguinal and no supraclavicular adenopathy present.  Neurological: She is alert and oriented to person, place, and time.  Skin: Skin is warm and dry. No rash noted. No erythema.  Psychiatric: Mood, affect and judgment normal.  Nursing note and vitals reviewed.   Appointment on 02/11/2018  Component Date Value Ref Range Status  . Magnesium 02/11/2018 1.9  1.7 - 2.4 mg/dL Final   Performed at [facility], [address] [place], [place] [address]  . Sodium 02/11/2018 136  135 - 145 mmol/L Final  . Potassium 02/11/2018 4.0  3.5 - 5.1 mmol/L Final  . Chloride 02/11/2018 106  98 - 111 mmol/L Final  . CO2 02/11/2018 20* 22 - 32 mmol/L Final  . Glucose, Bld 02/11/2018 330* 70 - 99 mg/dL Final  . BUN 02/11/2018 12  6 - 20 mg/dL Final  . Creatinine, Ser 02/11/2018 0.77  0.44 - 1.00 mg/dL Final  . Calcium 02/11/2018 9.4  8.9 - 10.3 mg/dL Final  . Total Protein 02/11/2018 7.2  6.5 - 8.1 g/dL Final  . Albumin 02/11/2018 4.3  3.5 - 5.0 g/dL Final  . AST 02/11/2018 34  15 - 41 U/L Final  . ALT 02/11/2018 61* 0 - 44 U/L Final  . Alkaline Phosphatase 02/11/2018 60  38 - 126 U/L Final  . Total Bilirubin 02/11/2018 0.8  0.3 - 1.2 mg/dL Final  . GFR calc non Af Amer 02/11/2018 >60  >60 mL/min Final  . GFR calc Af Amer 02/11/2018 >60  >60 mL/min Final   Comment: (NOTE) The eGFR has been calculated using the CKD EPI equation. This calculation has not been validated in all clinical situations. eGFR's persistently <60 mL/min signify possible Chronic Kidney Disease.   Georgiann Hahn gap 02/11/2018 10  5 - 15 Final   Performed at [facility], [address] [place], [place] [address]  . WBC 02/11/2018 13.1* 3.6 - 11.0 K/uL Final  . RBC 02/11/2018 4.05  3.80 -  5.20 MIL/uL Final  . Hemoglobin 02/11/2018 12.2  12.0 - 16.0 g/dL Final  . HCT 02/11/2018 35.1  35.0 - 47.0 % Final  . MCV 02/11/2018 86.7  80.0 - 100.0 fL Final  . MCH 02/11/2018 30.1  26.0 - 34.0 pg Final  . MCHC 02/11/2018 34.8  32.0 - 36.0 g/dL Final  .  RDW 02/11/2018 16.5* 11.5 - 14.5 % Final  . Platelets 02/11/2018 304  150 - 440 K/uL Final  . Neutrophils Relative % 02/11/2018 92  % Final  . Neutro Abs 02/11/2018 12.1* 1.4 - 6.5 K/uL Final  . Lymphocytes Relative 02/11/2018 6  % Final  . Lymphs Abs 02/11/2018 0.7* 1.0 - 3.6 K/uL Final  . Monocytes Relative 02/11/2018 2  % Final  . Monocytes Absolute 02/11/2018 0.3  0.2 - 0.9 K/uL Final  . Eosinophils Relative 02/11/2018 0  % Final  . Eosinophils Absolute 02/11/2018 0.0  0 - 0.7 K/uL Final  . Basophils Relative 02/11/2018 0  % Final  . Basophils Absolute 02/11/2018 0.0  0 - 0.1 K/uL Final   Performed at [facility], [address], [place], [place] [address]  . Preg Test, Ur 02/11/2018 NEGATIVE  NEGATIVE Final   Performed at [facility], [address] [place], [place] [address]    Assessment:  RHYTHM GUBBELS is a 42 y.o. female with multi-focal stage IB Her2/neu + left breast cancer s/p index breast mass and axillary node biopsy on 12/06/2017.  Pathology revealed grade II invasive ductal carcinoma with calcifications at the 11 o'clock position.  There was metastatic carcinoma in 1 of 1 lymph nodes.  Tumor was ER + (100%), PR + (100%), and Ki67 5%.  Her2/neu was 3+ by IHC (heterogeneous) and FISH +.  Diagnostic left mammogram and ultrasound on 12/06/2017 revealed a 1.7 x 1.3 x 1.3 cm irregular hypoechoic mass with internal calcifications at the 11 o'clock position 2 cm from the nipple with internal calcifications. There was a similar appearing 0.9 x 0.8 x 0.7 cm mass at the 11 o'clock position 4 cm from the nipple (2.4 cm from the index lesion).  There were suspicious, segmental calcifications originating from the  index lesion and extending 5 cm posteriorly.  There was a 0.6 cm morphologically abnormal left axillary lymph node.  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 The adjacent and more superiorly located 11 o'clock mass (7 x 12 mm) seen on recent ultrasound, 4 cm from the nipple on ultrasound, was also identified. There were 2 probable satellite lesions (7 mm, medial lesion; posterior and lateral lesion). The total span of malignancy was 3 x 3.1 x 4.5 cm.  There were calcifications extending up to 5 cm posterior to the biopsied malignancy which were highly suspicious on mammography but not appreciated.  There was a mass in the lower outer left breast measured 5 mm, representing a change.  The known metastatic node in the left axilla was identified. A node along the posterolateral margin of the pectoralis minor (cortex 5 mm) was nonspecific but at least somewhat suspicious.  Chest, abdomen, and pelvic CT on 12/19/2017 revealed a solitary enlarged biopsy-proven metastatic left axillary lymph node.  There were no additional findings suspicious for metastatic disease in the chest, abdomen or pelvis.  There was a nonspecific tiny sclerotic upper right sacral lesion, more likely a benign bone island. Bone scintigraphy correlation versus follow-up CT could be considered.  Bone scan on 01/11/2018 was negative for metastatic pattern. No areas of focal tracer uptake. Area of concern in sacrum on CT likely represents benign bone island.   [facility] genetic testing on 08/26/2013 was negative for BRCA1 and 2. 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  She  has a family history significant for breast, ovarian, pancreatic, and prostate cancer.  She is s/p 2 cycles of TCHP chemotherapy (12/21/2017 - 01/14/2018) with Margarette Canada support. Cycle #3 was held due to elevated LFTs on  02/04/2018. She is tolerating treatment well.  She has a history of elevated LFTs.  Hepatitis B and C serologies were negative on 02/04/2018.  RUQ ultrasound on 02/06/2018 revealed a small anterior gallbladder wall polyp. There was no evidence of cholelithiasis or cholecystis. CBD measured normal at 2 mm, with no evidence of choledocholithiasis. There was normal direction of blood flow towards the liver noted.  Symptomatically, she is doing well.  Exam is normal.  WBC 13,100 (ANC 12,100). Glucose 330 (steroid and ice cream induced).  AST 34 and ALT 61.  Plan: 1. Labs today:  CBC with diff, CMP, Mg 2. Breast cancer - treatment ongoing  Doing well. Tolerating treatments with minimal side effects.   Labs reviewed. Blood counts stable and adequate enough for treatment. Will proceed with cycle #3 TCHP with Udencya tomorrow. Discuss symptom management.  Patient has antiemetics and pain medications at home to use on a PRN basis. Patient  advising that the  prescribed interventions are adequate at this point. Continue all medications as previously prescribed.  3. Elevated LFTs - improved  Labs reviewed. AST 34, ALT 61, ALP 60, and total bilirubin 0.8.  Review hepatitis serology results - negative.  Review mild elevation in GGT to 56 (7 -50 U/L).   Discuss RUQ ultrasound - no gallstones or evidence of bilary obstruction. Small gallbladder polyp.   Etiology unclear.  Possibly related to Decadron.  Check LFTs prior to next cycle. 4. Oral candidiasis  - resolved  Related to systemic steroid use.   Has PRN antifungal mouthwash to use for recurrence.  5. Elevated blood sugar:   Discuss referral to PCP for management.   Patient has appointment today. 6. RTC on 03/01/2018 for LFTs. 7. RTC on 03/04/2018 for MD assessment, labs (CBC with diff, CMP, Mg), and cycle #4 TCHP. 8. RTC on 03/05/2018 for Udencya.   Lequita Asal, MD  02/11/2018, 4:20 PM

## 2018-02-11 ENCOUNTER — Encounter: Payer: Self-pay | Admitting: Hematology and Oncology

## 2018-02-11 ENCOUNTER — Inpatient Hospital Stay: Payer: BC Managed Care – PPO

## 2018-02-11 ENCOUNTER — Inpatient Hospital Stay (HOSPITAL_BASED_OUTPATIENT_CLINIC_OR_DEPARTMENT_OTHER): Payer: BC Managed Care – PPO | Admitting: Hematology and Oncology

## 2018-02-11 VITALS — BP 124/85 | HR 103 | Temp 96.7°F | Resp 18 | Wt 182.6 lb

## 2018-02-11 DIAGNOSIS — Z1501 Genetic susceptibility to malignant neoplasm of breast: Secondary | ICD-10-CM

## 2018-02-11 DIAGNOSIS — C50412 Malignant neoplasm of upper-outer quadrant of left female breast: Secondary | ICD-10-CM

## 2018-02-11 DIAGNOSIS — Z5112 Encounter for antineoplastic immunotherapy: Secondary | ICD-10-CM

## 2018-02-11 DIAGNOSIS — R945 Abnormal results of liver function studies: Secondary | ICD-10-CM | POA: Diagnosis not present

## 2018-02-11 DIAGNOSIS — R7989 Other specified abnormal findings of blood chemistry: Secondary | ICD-10-CM

## 2018-02-11 DIAGNOSIS — Z5111 Encounter for antineoplastic chemotherapy: Secondary | ICD-10-CM

## 2018-02-11 DIAGNOSIS — B370 Candidal stomatitis: Secondary | ICD-10-CM

## 2018-02-11 DIAGNOSIS — Z170 Estrogen receptor positive status [ER+]: Secondary | ICD-10-CM

## 2018-02-11 DIAGNOSIS — C773 Secondary and unspecified malignant neoplasm of axilla and upper limb lymph nodes: Secondary | ICD-10-CM

## 2018-02-11 DIAGNOSIS — R739 Hyperglycemia, unspecified: Secondary | ICD-10-CM | POA: Diagnosis not present

## 2018-02-11 LAB — COMPREHENSIVE METABOLIC PANEL
ALT: 61 U/L — ABNORMAL HIGH (ref 0–44)
AST: 34 U/L (ref 15–41)
Albumin: 4.3 g/dL (ref 3.5–5.0)
Alkaline Phosphatase: 60 U/L (ref 38–126)
Anion gap: 10 (ref 5–15)
BUN: 12 mg/dL (ref 6–20)
CO2: 20 mmol/L — ABNORMAL LOW (ref 22–32)
Calcium: 9.4 mg/dL (ref 8.9–10.3)
Chloride: 106 mmol/L (ref 98–111)
Creatinine, Ser: 0.77 mg/dL (ref 0.44–1.00)
GFR calc Af Amer: >60 mL/min (ref >60)
GFR calc non Af Amer: >60 mL/min (ref >60)
Glucose, Bld: 330 mg/dL — ABNORMAL HIGH (ref 70–99)
Potassium: 4 mmol/L (ref 3.5–5.1)
Sodium: 136 mmol/L (ref 135–145)
Total Bilirubin: 0.8 mg/dL (ref 0.3–1.2)
Total Protein: 7.2 g/dL (ref 6.5–8.1)

## 2018-02-11 LAB — CBC WITH DIFFERENTIAL/PLATELET
Basophils Absolute: 0 10*3/uL (ref 0–0.1)
Basophils Relative: 0 %
Eosinophils Absolute: 0 10*3/uL (ref 0–0.7)
Eosinophils Relative: 0 %
HCT: 35.1 % (ref 35.0–47.0)
Hemoglobin: 12.2 g/dL (ref 12.0–16.0)
Lymphocytes Relative: 6 %
Lymphs Abs: 0.7 10*3/uL — ABNORMAL LOW (ref 1.0–3.6)
MCH: 30.1 pg (ref 26.0–34.0)
MCHC: 34.8 g/dL (ref 32.0–36.0)
MCV: 86.7 fL (ref 80.0–100.0)
Monocytes Absolute: 0.3 10*3/uL (ref 0.2–0.9)
Monocytes Relative: 2 %
Neutro Abs: 12.1 10*3/uL — ABNORMAL HIGH (ref 1.4–6.5)
Neutrophils Relative %: 92 %
Platelets: 304 10*3/uL (ref 150–440)
RBC: 4.05 MIL/uL (ref 3.80–5.20)
RDW: 16.5 % — ABNORMAL HIGH (ref 11.5–14.5)
WBC: 13.1 10*3/uL — ABNORMAL HIGH (ref 3.6–11.0)

## 2018-02-11 LAB — MAGNESIUM: Magnesium: 1.9 mg/dL (ref 1.7–2.4)

## 2018-02-11 LAB — PREGNANCY, URINE: Preg Test, Ur: NEGATIVE

## 2018-02-11 MED ORDER — SODIUM CHLORIDE 0.9 % IV SOLN
Freq: Once | INTRAVENOUS | Status: AC
  Administered 2018-02-11: 10:00:00 via INTRAVENOUS
  Filled 2018-02-11: qty 250

## 2018-02-11 MED ORDER — SODIUM CHLORIDE 0.9 % IV SOLN
75.0000 mg/m2 | Freq: Once | INTRAVENOUS | Status: AC
  Administered 2018-02-11: 150 mg via INTRAVENOUS
  Filled 2018-02-11: qty 15

## 2018-02-11 MED ORDER — PALONOSETRON HCL INJECTION 0.25 MG/5ML
0.2500 mg | Freq: Once | INTRAVENOUS | Status: AC
  Administered 2018-02-11: 0.25 mg via INTRAVENOUS
  Filled 2018-02-11: qty 5

## 2018-02-11 MED ORDER — SODIUM CHLORIDE 0.9 % IV SOLN
420.0000 mg | Freq: Once | INTRAVENOUS | Status: AC
  Administered 2018-02-11: 420 mg via INTRAVENOUS
  Filled 2018-02-11: qty 14

## 2018-02-11 MED ORDER — SODIUM CHLORIDE 0.9 % IV SOLN
870.0000 mg | Freq: Once | INTRAVENOUS | Status: AC
  Administered 2018-02-11: 870 mg via INTRAVENOUS
  Filled 2018-02-11: qty 87

## 2018-02-11 MED ORDER — TRASTUZUMAB CHEMO 150 MG IV SOLR
500.0000 mg | Freq: Once | INTRAVENOUS | Status: AC
  Administered 2018-02-11: 500 mg via INTRAVENOUS
  Filled 2018-02-11: qty 23.81

## 2018-02-11 MED ORDER — DIPHENHYDRAMINE HCL 25 MG PO CAPS
50.0000 mg | ORAL_CAPSULE | Freq: Once | ORAL | Status: AC
  Administered 2018-02-11: 50 mg via ORAL
  Filled 2018-02-11: qty 2

## 2018-02-11 MED ORDER — SODIUM CHLORIDE 0.9 % IV SOLN
Freq: Once | INTRAVENOUS | Status: AC
  Administered 2018-02-11: 10:00:00 via INTRAVENOUS
  Filled 2018-02-11: qty 5

## 2018-02-11 MED ORDER — ACETAMINOPHEN 325 MG PO TABS
650.0000 mg | ORAL_TABLET | Freq: Once | ORAL | Status: AC
  Administered 2018-02-11: 650 mg via ORAL
  Filled 2018-02-11: qty 2

## 2018-02-11 MED ORDER — HEPARIN SOD (PORK) LOCK FLUSH 100 UNIT/ML IV SOLN
500.0000 [IU] | Freq: Once | INTRAVENOUS | Status: AC
  Administered 2018-02-11: 500 [IU] via INTRAVENOUS

## 2018-02-11 MED ORDER — HEPARIN SOD (PORK) LOCK FLUSH 100 UNIT/ML IV SOLN
INTRAVENOUS | Status: AC
  Filled 2018-02-11: qty 5

## 2018-02-11 NOTE — Progress Notes (Signed)
Patient states she has noticed when she brushes her teeth the back of her throat feels "like she has been eating altoids, (minty feeling).  States this is the way she feels when she is getting thrush.  Otherwise, offers no complaints.

## 2018-02-12 ENCOUNTER — Inpatient Hospital Stay: Payer: BC Managed Care – PPO

## 2018-02-12 DIAGNOSIS — B370 Candidal stomatitis: Secondary | ICD-10-CM

## 2018-02-12 DIAGNOSIS — C50412 Malignant neoplasm of upper-outer quadrant of left female breast: Secondary | ICD-10-CM | POA: Diagnosis not present

## 2018-02-12 DIAGNOSIS — Z170 Estrogen receptor positive status [ER+]: Secondary | ICD-10-CM

## 2018-02-12 MED ORDER — PEGFILGRASTIM-CBQV 6 MG/0.6ML ~~LOC~~ SOSY
6.0000 mg | PREFILLED_SYRINGE | Freq: Once | SUBCUTANEOUS | Status: AC
  Administered 2018-02-12: 6 mg via SUBCUTANEOUS

## 2018-02-19 ENCOUNTER — Other Ambulatory Visit: Payer: Self-pay | Admitting: Urgent Care

## 2018-02-19 MED ORDER — NYSTATIN 100000 UNIT/ML MT SUSP: OROMUCOSAL | 0 refills | Status: DC

## 2018-03-01 ENCOUNTER — Other Ambulatory Visit: Payer: Self-pay | Admitting: *Deleted

## 2018-03-01 ENCOUNTER — Other Ambulatory Visit: Payer: Self-pay

## 2018-03-01 ENCOUNTER — Inpatient Hospital Stay: Payer: BC Managed Care – PPO | Attending: Hematology and Oncology

## 2018-03-01 DIAGNOSIS — Z7984 Long term (current) use of oral hypoglycemic drugs: Secondary | ICD-10-CM | POA: Diagnosis not present

## 2018-03-01 DIAGNOSIS — Z170 Estrogen receptor positive status [ER+]: Secondary | ICD-10-CM | POA: Diagnosis not present

## 2018-03-01 DIAGNOSIS — R197 Diarrhea, unspecified: Secondary | ICD-10-CM | POA: Diagnosis not present

## 2018-03-01 DIAGNOSIS — C50412 Malignant neoplasm of upper-outer quadrant of left female breast: Secondary | ICD-10-CM | POA: Diagnosis present

## 2018-03-01 DIAGNOSIS — C773 Secondary and unspecified malignant neoplasm of axilla and upper limb lymph nodes: Secondary | ICD-10-CM | POA: Insufficient documentation

## 2018-03-01 DIAGNOSIS — Z79899 Other long term (current) drug therapy: Secondary | ICD-10-CM | POA: Insufficient documentation

## 2018-03-01 DIAGNOSIS — Z5112 Encounter for antineoplastic immunotherapy: Secondary | ICD-10-CM | POA: Insufficient documentation

## 2018-03-01 DIAGNOSIS — Z5111 Encounter for antineoplastic chemotherapy: Secondary | ICD-10-CM | POA: Insufficient documentation

## 2018-03-01 DIAGNOSIS — N6310 Unspecified lump in the right breast, unspecified quadrant: Secondary | ICD-10-CM

## 2018-03-01 LAB — HEPATIC FUNCTION PANEL
ALT: 91 U/L — ABNORMAL HIGH (ref 0–44)
AST: 42 U/L — ABNORMAL HIGH (ref 15–41)
Albumin: 3.9 g/dL (ref 3.5–5.0)
Alkaline Phosphatase: 55 U/L (ref 38–126)
Bilirubin, Direct: <0.1 mg/dL (ref 0.0–0.2)
Total Bilirubin: 0.6 mg/dL (ref 0.3–1.2)
Total Protein: 6.3 g/dL — ABNORMAL LOW (ref 6.5–8.1)

## 2018-03-03 ENCOUNTER — Encounter: Payer: Self-pay | Admitting: Urgent Care

## 2018-03-03 DIAGNOSIS — Z170 Estrogen receptor positive status [ER+]: Principal | ICD-10-CM

## 2018-03-03 DIAGNOSIS — C50412 Malignant neoplasm of upper-outer quadrant of left female breast: Secondary | ICD-10-CM

## 2018-03-03 MED ORDER — DEXAMETHASONE 4 MG PO TABS: 8.0000 mg | ORAL_TABLET | Freq: Two times a day (BID) | ORAL | 2 refills | Status: DC

## 2018-03-03 NOTE — Progress Notes (Signed)
Denise [name] day:  03/04/18  Chief Complaint: Denise [name] is a 42 y.o. female with multi-focal Her2/neu + left breast cancer who is seen for assessment prior to cycle #4 neoadjuvant TCHP with Udencya support.   HPI: The patient was last seen in the medical oncology [name] on 02/11/2018. At that time, patient was doing well overall. She denies any acute symptoms. No significant nausea, vomiting, or changes to bowel habits. Denies fevers or infections. Exam stable. WBC 13,100 (ANC 12,100). Glucose 330. AST 34 and ALT 61. She received cycle #3 TCHP.   Patient was seen in consult on 02/11/2018 by Grayland Ormond, PA [facility]) to establish primary care. Notes reviewed.  Patient prescribed Metformin 500 mg BID x 7 days, then 1000 mg BID. She was provided with a prescription for a glucometer and advised to check CBGs daily. CBGs running in the 90-100s on the 500 mg dose, she was ultimately advised NOT to increase dose. Patient to follow up in 1 month.   Repeat hepatic function panel on 03/01/2018 revealed persistently elevated transaminases. AST 42 (15 - 41 U/L) and ALT 91 (0 - 44 U/L). ALP normal at 55. Total bilirubin normal at 0.6.   In the interim, patient has been having diarrhea. She has been having daily watery stools. Diarrhea attributed to initiation of oral antidiabetic medication (Metformin). Patient denies that she has experienced any B symptoms. She denies any interval infections.   Patient advises that she maintains an adequate appetite. She is eating well. Weight today is 180 lb 9.6 oz (81.9 kg), which compared to her last visit to the [name], represents a stable weight.    Patient denies pain in the [name] today.  PCP asking for additional labs (TSH, FLP, UA) to be obtained with routine labs in the cancer center.    Past Medical History:  Diagnosis Date  . Abnormal breast biopsy    [facility], [name]  . Family history of breast  cancer   . Family history of ovarian cancer   . Fibroadenoma of breast, left 2000, 2016  . Genetic screening 08/26/2013   BRCA/BART negative/Myriad, CHEK2 POS 2017  . Increased risk of breast cancer 2017   IBIS=47%  . Left breast lump 06/18/2017   Fluid/drained [name]  . Migraine   . Monoallelic mutation of CHEK2 gene in female patient 2017   increased risk of breast and colon cancer    Past Surgical History:  Procedure Laterality Date  . BREAST BIOPSY Bilateral 09/08/14   neg  . BREAST BIOPSY Left 03/16/2015   Procedure: BREAST BIOPSY WITH NEEDLE LOCALIZATION;  Surgeon: [name], MD;  Location: [facility] ORS;  Service: General;  Laterality: Left;  . BREAST CYST ASPIRATION Left 06/18/2017   cyst aspiration  . BREAST EXCISIONAL BIOPSY Left 09/2014  . BREAST EXCISIONAL BIOPSY Right    age 17's  . BREAST SURGERY Right March 2000   benign fibroadenoma  . BREAST SURGERY Left 08/08/13   excision  . BREAST SURGERY Left 09/23/14   excision  . PORTACATH PLACEMENT Right 12/17/2017   Procedure: INSERTION PORT-A-CATH;  Surgeon: [name], MD;  Location: [facility] ORS;  Service: General;  Laterality: Right;    Family History  Problem Relation Age of Onset  . Prostate cancer Father 9  . Breast cancer Paternal Grandmother 48  . Breast cancer Maternal Aunt 60  . Ovarian cancer Maternal Aunt 70  . Breast cancer Paternal Aunt 67  .  Pancreatic cancer Maternal Aunt 75    Social History:  reports that she has never smoked. She has never used smokeless tobacco. She reports that she drinks alcohol. She reports that she does not use drugs.  She does not smoke. She "occasionally" drinks alcohol. 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 Patient denies known exposures to radiation on toxins. Her husband's name is Denise [name]. She has 2 daughters, Denise [name] and Denise [name] (ages 36 1/2 and 76).  Her husband's name is Denise [name].  The patient is alone today.  Allergies:  Allergies   Allergen Reactions  . Tape     Minor skin irritation     Current Medications: Current Outpatient Medications  Medication Sig Dispense Refill  . dexamethasone (DECADRON) 4 MG tablet Take 2 tablets (8 mg total) by mouth 2 (two) times daily. Start the day before Taxotere. Take once the day after, then 2 times a day x 2d. 30 tablet 2  . lidocaine-prilocaine (EMLA) cream Apply to affected area once 30 g 3  . loperamide (IMODIUM) 2 MG capsule Take by mouth.    . loratadine (CLARITIN) 10 MG tablet Take by mouth.    . metFORMIN (GLUCOPHAGE) 500 MG tablet Take 500 mg by mouth 2 (two) times daily with a meal.     . ondansetron (ZOFRAN) 8 MG tablet Take 1 tablet (8 mg total) by mouth every 8 (eight) hours as needed (Nausea or vomiting). 30 tablet 3  . prochlorperazine (COMPAZINE) 10 MG tablet Take 1 tablet (10 mg total) by mouth every 6 (six) hours as needed (Nausea or vomiting). 30 tablet 1  . Blood Glucose Monitoring Suppl (FIFTY50 GLUCOSE METER 2.0) w/Device KIT Use as directed    . glucose blood (PRECISION QID TEST) test strip Use 1 each (1 strip total) once daily Use as instructed.    Marland Kitchen HYDROcodone-acetaminophen (NORCO/VICODIN) 5-325 MG tablet Take 1 tablet by mouth every 6 (six) hours as needed for moderate pain. (Patient not taking: Reported on 03/04/2018) 20 tablet 0  . naproxen sodium (ALEVE) 220 MG tablet Take 440 mg by mouth daily as needed (pain).    . nystatin (MYCOSTATIN) 100000 UNIT/ML suspension Swish and swallow 5 cc QID x 5 days, then PRN. (Patient not taking: Reported on 03/04/2018) 240 mL 0  . omeprazole (PRILOSEC) 10 MG capsule Take 10 mg by mouth daily.     No current facility-administered medications for this visit.    Facility-Administered Medications Ordered in Other Visits  Medication Dose Route Frequency Provider Last Rate Last Dose  . 0.9 %  sodium chloride infusion   Intravenous Continuous Lequita Asal, MD 10 mL/hr at 03/04/18 1049    . [COMPLETED] heparin lock  flush 100 unit/mL  500 Units Intravenous Once Lequita Asal, MD   500 Units at 03/04/18 1200  . magnesium sulfate IVPB 2 g 50 mL  2 g Intravenous Once Karen Kitchens, NP 50 mL/hr at 03/04/18 1050 2 g at 03/04/18 1050  . sodium chloride flush (NS) 0.9 % injection 10 mL  10 mL Intravenous PRN Lequita Asal, MD   10 mL at 03/04/18 2751    Review of Systems  Constitutional: Negative for diaphoresis, fever, malaise/fatigue and weight loss (stable).       Feels "ok".  HENT: Negative.   Eyes: Negative.   Respiratory: Negative for cough, hemoptysis, sputum production and shortness of breath.   Cardiovascular: Negative for chest pain, palpitations, orthopnea, leg swelling and PND.  Gastrointestinal: Positive  for diarrhea. Negative for abdominal pain, blood in stool, constipation, melena, nausea and vomiting.  Genitourinary: Negative for dysuria, frequency, hematuria and urgency.  Musculoskeletal: Negative for back pain, falls, joint pain and myalgias.  Skin: Negative for itching and rash.       Power port to RIGHT chest  Neurological: Negative for dizziness, tremors, weakness and headaches.  Endo/Heme/Allergies: Does not bruise/bleed easily.       Diabetes on Metformin  Psychiatric/Behavioral: Negative for depression, memory loss and suicidal ideas. The patient is not nervous/anxious and does not have insomnia.   All other systems reviewed and are negative.  Performance status (ECOG): 1 - Symptomatic but completely ambulatory  Vital Signs BP 122/83 (BP Location: Left Arm, Patient Position: Sitting)   Pulse 86   Temp 98.7 F (37.1 C) (Tympanic)   Resp 18   Wt 180 lb 9.6 oz (81.9 kg)   BMI 28.29 kg/m   Physical Exam  Constitutional: She is oriented to person, place, and time and well-developed, well-nourished, and in no distress.  HENT:  Head: Normocephalic and atraumatic.  Wearing a gray wrap.  Alopecia.  Eyes: Pupils are equal, round, and reactive to light. EOM are normal.  No scleral icterus.  Neck: Normal range of motion. Neck supple. No tracheal deviation present. No thyromegaly present.  Cardiovascular: Normal rate, regular rhythm and normal heart sounds. Exam reveals no gallop and no friction rub.  No murmur heard. Pulmonary/Chest: Effort normal and breath sounds normal. No respiratory distress. She has no wheezes. She has no rales.  Power port to RIGHT chest wall  Abdominal: Soft. Bowel sounds are normal. She exhibits no distension. There is no tenderness.  Musculoskeletal: Normal range of motion. She exhibits no edema or tenderness.  Lymphadenopathy:    She has no cervical adenopathy.    She has no axillary adenopathy.       Right: No inguinal and no supraclavicular adenopathy present.       Left: No inguinal and no supraclavicular adenopathy present.  Neurological: She is alert and oriented to person, place, and time.  Skin: Skin is warm and dry. No rash noted. No erythema.  Psychiatric: Mood, affect and judgment normal.  Nursing note and vitals reviewed.   Infusion on 03/04/2018  Component Date Value Ref Range Status  . Color, Urine 03/04/2018 YELLOW* YELLOW Final  . APPearance 03/04/2018 CLEAR* CLEAR Final  . Specific Gravity, Urine 03/04/2018 1.014  1.005 - 1.030 Final  . pH 03/04/2018 6.0  5.0 - 8.0 Final  . Glucose, UA 03/04/2018 NEGATIVE  NEGATIVE mg/dL Final  . Hgb urine dipstick 03/04/2018 NEGATIVE  NEGATIVE Final  . Bilirubin Urine 03/04/2018 NEGATIVE  NEGATIVE Final  . Ketones, ur 03/04/2018 NEGATIVE  NEGATIVE mg/dL Final  . Protein, ur 03/04/2018 NEGATIVE  NEGATIVE mg/dL Final  . Nitrite 03/04/2018 NEGATIVE  NEGATIVE Final  . Leukocytes, UA 03/04/2018 NEGATIVE  NEGATIVE Final  . RBC / HPF 03/04/2018 0-5  0 - 5 RBC/hpf Final  . WBC, UA 03/04/2018 0-5  0 - 5 WBC/hpf Final  . Bacteria, UA 03/04/2018 NONE SEEN  NONE SEEN Final  . Squamous Epithelial / LPF 03/04/2018 0-5  0 - 5 Final  . Mucus 03/04/2018 PRESENT   Final   Performed at  [facility], [address], [place], [place] [address]  . Cholesterol 03/04/2018 191  0 - 200 mg/dL Final  . Triglycerides 03/04/2018 47  <150 mg/dL Final  . HDL 03/04/2018 63  >40 mg/dL Final  . Total  CHOL/HDL Ratio 03/04/2018 3.0  RATIO Final  . VLDL 03/04/2018 9  0 - 40 mg/dL Final  . LDL Cholesterol 03/04/2018 119* 0 - 99 mg/dL Final   Comment:        Total Cholesterol/HDL:CHD Risk Coronary Heart Disease Risk Table                     Men   Women  1/2 Average Risk   3.4   3.3  Average Risk       5.0   4.4  2 X Average Risk   9.6   7.1  3 X Average Risk  23.4   11.0        Use the calculated Patient Ratio above and the CHD Risk Table to determine the patient's CHD Risk.        ATP III CLASSIFICATION (LDL):  <100     mg/dL   Optimal  100-129  mg/dL   Near or Above                    Optimal  130-159  mg/dL   Borderline  160-189  mg/dL   High  >190     mg/dL   Very High Performed at [facility], [address], [place], [place] [address]   . TSH 03/04/2018 0.169* 0.350 - 4.500 uIU/mL Final   Comment: Performed by a 3rd Generation assay with a functional sensitivity of <=0.01 uIU/mL. Performed at [facility], [address] [place], [place] [address]   . WBC 03/04/2018 12.8* 3.6 - 11.0 K/uL Final  . RBC 03/04/2018 3.76* 3.80 - 5.20 MIL/uL Final  . Hemoglobin 03/04/2018 11.3* 12.0 - 16.0 g/dL Final  . HCT 03/04/2018 32.8* 35.0 - 47.0 % Final  . MCV 03/04/2018 87.2  80.0 - 100.0 fL Final  . MCH 03/04/2018 30.1  26.0 - 34.0 pg Final  . MCHC 03/04/2018 34.5  32.0 - 36.0 g/dL Final  . RDW 03/04/2018 17.3* 11.5 - 14.5 % Final  . Platelets 03/04/2018 264  150 - 440 K/uL Final  . Neutrophils Relative % 03/04/2018 91  % Final  . Neutro Abs 03/04/2018 11.6* 1.4 - 6.5 K/uL Final  . Lymphocytes Relative 03/04/2018 7  % Final  . Lymphs Abs 03/04/2018 0.8* 1.0 - 3.6 K/uL Final  . Monocytes Relative 03/04/2018 2  % Final  . Monocytes Absolute  03/04/2018 0.3  0.2 - 0.9 K/uL Final  . Eosinophils Relative 03/04/2018 0  % Final  . Eosinophils Absolute 03/04/2018 0.0  0 - 0.7 K/uL Final  . Basophils Relative 03/04/2018 0  % Final  . Basophils Absolute 03/04/2018 0.0  0 - 0.1 K/uL Final   Performed at [facility], [address] [place], [place] [address]  . Magnesium 03/04/2018 1.6* 1.7 - 2.4 mg/dL Final   Performed at [facility], [address] [place], [place] [address]  . Sodium 03/04/2018 139  135 - 145 mmol/L Final  . Potassium 03/04/2018 4.0  3.5 - 5.1 mmol/L Final  . Chloride 03/04/2018 108  98 - 111 mmol/L Final  . CO2 03/04/2018 22  22 - 32 mmol/L Final  . Glucose, Bld 03/04/2018 138* 70 - 99 mg/dL Final  . BUN 03/04/2018 12  6 - 20 mg/dL Final  . Creatinine, Ser 03/04/2018 0.62  0.44 - 1.00 mg/dL Final  . Calcium 03/04/2018 9.5  8.9 - 10.3 mg/dL Final  . Total Protein 03/04/2018 6.9  6.5 -  8.1 g/dL Final  . Albumin 03/04/2018 4.4  3.5 - 5.0 g/dL Final  . AST 03/04/2018 38  15 - 41 U/L Final  . ALT 03/04/2018 78* 0 - 44 U/L Final  . Alkaline Phosphatase 03/04/2018 54  38 - 126 U/L Final  . Total Bilirubin 03/04/2018 0.8  0.3 - 1.2 mg/dL Final  . GFR calc non Af Amer 03/04/2018 >60  >60 mL/min Final  . GFR calc Af Amer 03/04/2018 >60  >60 mL/min Final   Comment: (NOTE) The eGFR has been calculated using the CKD EPI equation. This calculation has not been validated in all clinical situations. eGFR's persistently <60 mL/min signify possible Chronic Kidney Disease.   Georgiann Hahn gap 03/04/2018 9  5 - 15 Final   Performed at [facility], [address] [place], [place] [address]  . Preg Test, Ur 03/04/2018 NEGATIVE  NEGATIVE Final   Performed at [facility], [address] [place], [place] [address]    Assessment:  KALIANNA VERBEKE is a 42 y.o. female with multi-focal stage IB Her2/neu + left breast cancer s/p index breast mass and axillary node biopsy on 12/06/2017.  Pathology  revealed grade II invasive ductal carcinoma with calcifications at the 11 o'clock position.  There was metastatic carcinoma in 1 of 1 lymph nodes.  Tumor was ER + (100%), PR + (100%), and Ki67 5%.  Her2/neu was 3+ by IHC (heterogeneous) and FISH +.  Diagnostic left mammogram and ultrasound on 12/06/2017 revealed a 1.7 x 1.3 x 1.3 cm irregular hypoechoic mass with internal calcifications at the 11 o'clock position 2 cm from the nipple with internal calcifications. There was a similar appearing 0.9 x 0.8 x 0.7 cm mass at the 11 o'clock position 4 cm from the nipple (2.4 cm from the index lesion).  There were suspicious, segmental calcifications originating from the index lesion and extending 5 cm posteriorly.  There was a 0.6 cm morphologically abnormal left axillary lymph node.  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 The adjacent and more superiorly located 11 o'clock mass (7 x 12 mm) seen on recent ultrasound, 4 cm from the nipple on ultrasound, was also identified. There were 2 probable satellite lesions (7 mm, medial lesion; posterior and lateral lesion). The total span of malignancy was 3 x 3.1 x 4.5 cm.  There were calcifications extending up to 5 cm posterior to the biopsied malignancy which were highly suspicious on mammography but not appreciated.  There was a mass in the lower outer left breast measured 5 mm, representing a change.  The known metastatic node in the left axilla was identified. A node along the posterolateral margin of the pectoralis minor (cortex 5 mm) was nonspecific but at least somewhat suspicious.  Chest, abdomen, and pelvic CT on 12/19/2017 revealed a solitary enlarged biopsy-proven metastatic left axillary lymph node.  There were no additional findings suspicious for metastatic disease in the chest, abdomen or pelvis.  There was a nonspecific tiny sclerotic  upper right sacral lesion, more likely a benign bone island. Bone scintigraphy correlation versus follow-up CT could be considered.  Bone scan on 01/11/2018 was negative for metastatic pattern. No areas of focal tracer uptake. Area of concern in sacrum on CT likely represents benign bone island.   Myriad genetic testing on 08/26/2013 was negative for BRCA1 and 2. CHEK2 mutation positive (increased risk  of breast and colon cancer).  She has a family history significant for breast, ovarian, pancreatic, and prostate cancer.  She is s/p 3 cycles of TCHP chemotherapy (12/21/2017 - 02/11/2018) with Margarette Canada support. Cycle #3 was held due to elevated LFTs on 02/04/2018. She is tolerating treatment well.  She has a history of elevated LFTs.  Hepatitis B and C serologies were negative on 02/04/2018.  RUQ ultrasound on 02/06/2018 revealed a small anterior gallbladder wall polyp. There was no evidence of cholelithiasis or cholecystis. CBD measured normal at 2 mm, with no evidence of choledocholithiasis. There was normal direction of blood flow towards the liver noted.  Symptomatically, patient has been experiencing diarrhea. No fevers or associated abdominal pain. Patient denies that she has experienced any B symptoms. She denies any interval infections. Exam is stable. WBC 12,800 (ANC 11,600). Magnesium low at 1.6. Potassium normal at 4.0. Glucose 138. TSH low at 0.169.  Plan: 1. Labs today: CBC with diff, CMP, Mg, TSH, FLP, UA. 2. Breast cancer  Doing well. Tolerating treatments with minimal side effects.   Labs reviewed. Blood counts stable and adequate enough for treatment, however patient has an acute diarrheal illness of unknown etiology.  Will postpone cycle #4 TCH.  Discuss plan for follow-up ultrasound with  [name]. Spoke with surgeon via phone and he agreed to see her in follow at 1400 today.  Discuss symptom management.  Patient has antiemetics and pain medications at home to use on a PRN  basis. Patient  advising that the  prescribed interventions are adequate at this point. Continue all medications as previously prescribed.  3. Diarrhea  Etiology unknown. Could be related to Metformin, chemotherapy, or be of infectious nature.  Check C.diff and GI panel.   Encouraged to increase oral hydration with electrolyte fortified fluids.   Avoid antidiarrheals until infectious etiology ruled out.  4. Hypomagnesemia  Discuss low magnesium. Magnesium low at 1.6.   Will replace with 2 grams intravenous magnesium today.  5. Elevated LFTs  Labs reviewed. AST 38, ALT 78, ALP 54, and total bilirubin 0.8.  Review LFTs from 03/01/2018 (higher than 03/04/2018).  Etiology thus not related to Decadron. 6. Elevated blood sugars  Glucose 138 today. Patient took Decadron dose yesterday.   CBGs running 90-100s on Metformin 500 mg BID. Levels increase with oral steroid use.  Spoke with PCP's office via phone. Patient to be switched to Metformin ER 500 mg BID.    Scheduled follow up with PCP on 03/11/2018.  7. RTC based on resolution of diarrhea.    Addendum:  Stool for C diff and GI panel negative.  Diarrhea less.  Patient scheduled for cycle #4 TCHP on 03/07/2018 with Neulasta support.   Honor Loh, NP  03/04/18, 11:08 AM   I saw and evaluated the patient, participating in the key portions of the service and reviewing pertinent diagnostic studies and records.  I reviewed the nurse practitioner's note and agree with the findings and the plan.  The assessment and plan were discussed with the patient.  Multiple questions were asked by the patient and answered.   Nolon Stalls, MD 03/04/2018,11:08 AM

## 2018-03-04 ENCOUNTER — Inpatient Hospital Stay (HOSPITAL_BASED_OUTPATIENT_CLINIC_OR_DEPARTMENT_OTHER): Payer: BC Managed Care – PPO | Admitting: Hematology and Oncology

## 2018-03-04 ENCOUNTER — Encounter: Payer: Self-pay | Admitting: Hematology and Oncology

## 2018-03-04 ENCOUNTER — Encounter: Payer: Self-pay | Admitting: General Surgery

## 2018-03-04 ENCOUNTER — Inpatient Hospital Stay: Payer: BC Managed Care – PPO

## 2018-03-04 ENCOUNTER — Other Ambulatory Visit: Payer: Self-pay

## 2018-03-04 ENCOUNTER — Ambulatory Visit: Payer: Self-pay

## 2018-03-04 ENCOUNTER — Ambulatory Visit (INDEPENDENT_AMBULATORY_CARE_PROVIDER_SITE_OTHER): Payer: BC Managed Care – PPO | Admitting: General Surgery

## 2018-03-04 VITALS — BP 124/68 | HR 72 | Temp 98.6°F | Resp 13 | Ht 65.0 in | Wt 182.0 lb

## 2018-03-04 VITALS — BP 122/83 | HR 86 | Temp 98.7°F | Resp 18 | Wt 180.6 lb

## 2018-03-04 VITALS — BP 109/73 | HR 93 | Temp 97.8°F | Resp 18

## 2018-03-04 DIAGNOSIS — N6310 Unspecified lump in the right breast, unspecified quadrant: Secondary | ICD-10-CM

## 2018-03-04 DIAGNOSIS — C50212 Malignant neoplasm of upper-inner quadrant of left female breast: Secondary | ICD-10-CM

## 2018-03-04 DIAGNOSIS — Z170 Estrogen receptor positive status [ER+]: Secondary | ICD-10-CM

## 2018-03-04 DIAGNOSIS — R197 Diarrhea, unspecified: Secondary | ICD-10-CM

## 2018-03-04 DIAGNOSIS — C50412 Malignant neoplasm of upper-outer quadrant of left female breast: Secondary | ICD-10-CM

## 2018-03-04 DIAGNOSIS — R7989 Other specified abnormal findings of blood chemistry: Secondary | ICD-10-CM

## 2018-03-04 DIAGNOSIS — C773 Secondary and unspecified malignant neoplasm of axilla and upper limb lymph nodes: Secondary | ICD-10-CM | POA: Diagnosis not present

## 2018-03-04 DIAGNOSIS — R739 Hyperglycemia, unspecified: Secondary | ICD-10-CM

## 2018-03-04 DIAGNOSIS — R945 Abnormal results of liver function studies: Secondary | ICD-10-CM

## 2018-03-04 DIAGNOSIS — Z7189 Other specified counseling: Secondary | ICD-10-CM

## 2018-03-04 LAB — COMPREHENSIVE METABOLIC PANEL
ALT: 78 U/L — ABNORMAL HIGH (ref 0–44)
AST: 38 U/L (ref 15–41)
Albumin: 4.4 g/dL (ref 3.5–5.0)
Alkaline Phosphatase: 54 U/L (ref 38–126)
Anion gap: 9 (ref 5–15)
BUN: 12 mg/dL (ref 6–20)
CO2: 22 mmol/L (ref 22–32)
Calcium: 9.5 mg/dL (ref 8.9–10.3)
Chloride: 108 mmol/L (ref 98–111)
Creatinine, Ser: 0.62 mg/dL (ref 0.44–1.00)
GFR calc Af Amer: >60 mL/min (ref >60)
GFR calc non Af Amer: >60 mL/min (ref >60)
Glucose, Bld: 138 mg/dL — ABNORMAL HIGH (ref 70–99)
Potassium: 4 mmol/L (ref 3.5–5.1)
Sodium: 139 mmol/L (ref 135–145)
Total Bilirubin: 0.8 mg/dL (ref 0.3–1.2)
Total Protein: 6.9 g/dL (ref 6.5–8.1)

## 2018-03-04 LAB — URINALYSIS, COMPLETE (UACMP) WITH MICROSCOPIC
Bacteria, UA: NONE SEEN
Bilirubin Urine: NEGATIVE
Glucose, UA: NEGATIVE mg/dL
Hgb urine dipstick: NEGATIVE
Ketones, ur: NEGATIVE mg/dL
Leukocytes, UA: NEGATIVE
Nitrite: NEGATIVE
Protein, ur: NEGATIVE mg/dL
Specific Gravity, Urine: 1.014 (ref 1.005–1.030)
pH: 6 (ref 5.0–8.0)

## 2018-03-04 LAB — CBC WITH DIFFERENTIAL/PLATELET
Basophils Absolute: 0 10*3/uL (ref 0–0.1)
Basophils Relative: 0 %
Eosinophils Absolute: 0 10*3/uL (ref 0–0.7)
Eosinophils Relative: 0 %
HCT: 32.8 % — ABNORMAL LOW (ref 35.0–47.0)
Hemoglobin: 11.3 g/dL — ABNORMAL LOW (ref 12.0–16.0)
Lymphocytes Relative: 7 %
Lymphs Abs: 0.8 10*3/uL — ABNORMAL LOW (ref 1.0–3.6)
MCH: 30.1 pg (ref 26.0–34.0)
MCHC: 34.5 g/dL (ref 32.0–36.0)
MCV: 87.2 fL (ref 80.0–100.0)
Monocytes Absolute: 0.3 10*3/uL (ref 0.2–0.9)
Monocytes Relative: 2 %
Neutro Abs: 11.6 10*3/uL — ABNORMAL HIGH (ref 1.4–6.5)
Neutrophils Relative %: 91 %
Platelets: 264 10*3/uL (ref 150–440)
RBC: 3.76 MIL/uL — ABNORMAL LOW (ref 3.80–5.20)
RDW: 17.3 % — ABNORMAL HIGH (ref 11.5–14.5)
WBC: 12.8 10*3/uL — ABNORMAL HIGH (ref 3.6–11.0)

## 2018-03-04 LAB — PREGNANCY, URINE: Preg Test, Ur: NEGATIVE

## 2018-03-04 LAB — LIPID PANEL
Cholesterol: 191 mg/dL (ref 0–200)
HDL: 63 mg/dL (ref >40)
LDL Cholesterol: 119 mg/dL — ABNORMAL HIGH (ref 0–99)
Total CHOL/HDL Ratio: 3 RATIO
Triglycerides: 47 mg/dL (ref <150)
VLDL: 9 mg/dL (ref 0–40)

## 2018-03-04 LAB — TSH: TSH: 0.169 u[IU]/mL — ABNORMAL LOW (ref 0.350–4.500)

## 2018-03-04 LAB — MAGNESIUM: Magnesium: 1.6 mg/dL — ABNORMAL LOW (ref 1.7–2.4)

## 2018-03-04 MED ORDER — SODIUM CHLORIDE 0.9% FLUSH
10.0000 mL | INTRAVENOUS | Status: DC | PRN
  Administered 2018-03-04: 10 mL via INTRAVENOUS
  Filled 2018-03-04: qty 10

## 2018-03-04 MED ORDER — SODIUM CHLORIDE 0.9 % IV SOLN
INTRAVENOUS | Status: DC
  Administered 2018-03-04: 11:00:00 via INTRAVENOUS
  Filled 2018-03-04: qty 250

## 2018-03-04 MED ORDER — HEPARIN SOD (PORK) LOCK FLUSH 100 UNIT/ML IV SOLN
500.0000 [IU] | Freq: Once | INTRAVENOUS | Status: AC
  Administered 2018-03-04: 500 [IU] via INTRAVENOUS
  Filled 2018-03-04: qty 5

## 2018-03-04 MED ORDER — MAGNESIUM SULFATE 2 GM/50ML IV SOLN
2.0000 g | Freq: Once | INTRAVENOUS | Status: AC
  Administered 2018-03-04: 2 g via INTRAVENOUS
  Filled 2018-03-04: qty 50

## 2018-03-04 NOTE — Progress Notes (Signed)
Here for follow up. Per pt "alright today" stated she has been having " watery diarrhea stools daily"

## 2018-03-04 NOTE — Patient Instructions (Signed)
Referral to be made to Dr. Audelia Hives. Her contact info is as follows: Phone: [phone] Address: [address] [place] [address]

## 2018-03-04 NOTE — Progress Notes (Signed)
Patient ID: Denise [name], female   DOB: September 06, 1975, 42 y.o.   MRN: [id number]  Chief Complaint  Patient presents with  . Follow-up    HPI Denise [name] is a 42 y.o. female here today for a left breast ultrasound.  HPI  Past Medical History:  Diagnosis Date  . Abnormal breast biopsy    [facility], [name]  . Family history of breast cancer   . Family history of ovarian cancer   . Fibroadenoma of breast, left 2000, 2016  . Genetic screening 08/26/2013   BRCA/BART negative/[facility], CHEK2 POS 2017  . Increased risk of breast cancer 2017   IBIS=47%  . Left breast lump 06/18/2017   Fluid/drained J Denise [name]  . Migraine   . Monoallelic mutation of CHEK2 gene in female patient 2017   increased risk of breast and colon cancer    Past Surgical History:  Procedure Laterality Date  . BREAST BIOPSY Bilateral 09/08/14   neg  . BREAST BIOPSY Left 03/16/2015   Procedure: BREAST BIOPSY WITH NEEDLE LOCALIZATION;  Surgeon: [name], MD;  Location: [facility] ORS;  Service: General;  Laterality: Left;  . BREAST CYST ASPIRATION Left 06/18/2017   cyst aspiration  . BREAST EXCISIONAL BIOPSY Left 09/2014  . BREAST EXCISIONAL BIOPSY Right    age 39's  . BREAST SURGERY Right March 2000   benign fibroadenoma  . BREAST SURGERY Left 08/08/13   excision  . BREAST SURGERY Left 09/23/14   excision  . PORTACATH PLACEMENT Right 12/17/2017   Procedure: INSERTION PORT-A-CATH;  Surgeon: [name], MD;  Location: [facility] OR[facility];  Service: General;  Laterality: Right;    Family History  Problem Relation Age of Onset  . Prostate cancer Father 31  . Breast cancer Paternal Grandmother 2  . Breast cancer Maternal Aunt 60  . Ovarian cancer Maternal Aunt 70  . Breast cancer Paternal Aunt 75  . Pancreatic cancer Maternal Aunt 75    Social History Social History   Tobacco Use  . Smoking status: Never Smoker  . Smokeless tobacco: Never Used  Substance Use Topics  . Alcohol use: Yes    Comment:  occasionally  . Drug use: No    Allergies  Allergen Reactions  . Tape     Minor skin irritation     Current Outpatient Medications  Medication Sig Dispense Refill  . Blood Glucose Monitoring Suppl (FIFTY50 GLUCOSE METER 2.0) w/Device KIT Use as directed    . dexamethasone (DECADRON) 4 MG tablet Take 2 tablets (8 mg total) by mouth 2 (two) times daily. Start the day before Taxotere. Take once the day after, then 2 times a day x 2d. 30 tablet 2  . glucose blood (PRECISION QID TEST) test strip Use 1 each (1 strip total) once daily Use as instructed.    Marland Kitchen HYDROcodone-acetaminophen (NORCO/VICODIN) 5-325 MG tablet Take 1 tablet by mouth every 6 (six) hours as needed for moderate pain. 20 tablet 0  . lidocaine-prilocaine (EMLA) cream Apply to affected area once 30 g 3  . loperamide (IMODIUM) 2 MG capsule Take by mouth.    . loratadine (CLARITIN) 10 MG tablet Take by mouth.    . metFORMIN (GLUCOPHAGE) 500 MG tablet Take 500 mg by mouth 2 (two) times daily with a meal.     . naproxen sodium (ALEVE) 220 MG tablet Take 440 mg by mouth daily as needed (pain).    . nystatin (MYCOSTATIN) 100000 UNIT/ML suspension Swish and swallow 5 cc QID x 5  days, then PRN. 240 mL 0  . omeprazole (PRILOSEC) 10 MG capsule Take 10 mg by mouth daily.    . ondansetron (ZOFRAN) 8 MG tablet Take 1 tablet (8 mg total) by mouth every 8 (eight) hours as needed (Nausea or vomiting). 30 tablet 3  . prochlorperazine (COMPAZINE) 10 MG tablet Take 1 tablet (10 mg total) by mouth every 6 (six) hours as needed (Nausea or vomiting). 30 tablet 1   No current facility-administered medications for this visit.     Review of Systems Review of Systems  Constitutional: Negative.   Respiratory: Negative.   Cardiovascular: Negative.     Blood pressure 124/68, pulse 72, temperature 98.6 F (37 C), temperature source Skin, resp. rate 13, height 5' 5"  (1.651 m), weight 182 lb (82.6 kg).  Physical Exam Physical Exam  Constitutional:  She is oriented to person, place, and time. She appears well-developed and well-nourished.  Cardiovascular: Normal rate, regular rhythm and normal heart sounds.  Pulmonary/Chest: Effort normal and breath sounds normal.    Lymphadenopathy:    She has no cervical adenopathy.    She has no axillary adenopathy.       Right: No supraclavicular adenopathy present.       Left: No supraclavicular adenopathy present.  Neurological: She is alert and oriented to person, place, and time.  Skin: Skin is warm and dry.    Data Reviewed Ultrasound examination was completed at the request of her medical oncologist to assess response to neoadjuvant chemotherapy.  In the 11 o'clock position of the left breast 3 cm from the nipple the previous biopsy site is well identified measuring 0.48 x 0.77 x 0.83.  This suggests a volume of 0.3 cm. Prior to treatment the area measured 3.5 cm.  The left axillary lymph node is less well visualized than prior to treatment, but remains 1.75 cm in maximal diameter.  Previously 1.86 cm.  Perhaps a 10% change.  BI-RADS-6.  Assessment    Significant response and primary tumor response to neoadjuvant chemotherapy.    Plan  Had a long discussion regarding her progress, she is been frustrated by high sugars, and likely related to her steroid prep, and also mild elevation in serum transaminases.  Recently completed CT does not show evidence of metastatic disease.  We will arrange for consultation with plastic surgery as she is planning on mastectomy on the left side, and would like to have bilateral mastectomies.  We have gone over the pros and cons of bilateral procedures including the increased operative time, potential increased surgical risks and low risk of contralateral disease.  She is young, and certainly she is been through quite a bit with multiple biopsies on the left, none of which were in the quadrant where her eventual tumor developed.  All appear with based on  her completion of her neoadjuvant chemotherapy.   HPI, Physical Exam, Assessment and Plan have been scribed under the direction and in the presence of Hervey Ard, MD.  Gaspar Cola, CMA  I have completed the exam and reviewed the above documentation for accuracy and completeness.  I agree with the above.  Haematologist has been used and any errors in dictation or transcription are unintentional.  Hervey Ard, M.D., F.A.C.S.  Forest Gleason Bairon Klemann 03/04/2018, 3:40 PM

## 2018-03-05 ENCOUNTER — Inpatient Hospital Stay: Payer: BC Managed Care – PPO

## 2018-03-06 ENCOUNTER — Ambulatory Visit: Payer: BC Managed Care – PPO

## 2018-03-06 ENCOUNTER — Other Ambulatory Visit: Payer: Self-pay | Admitting: Urgent Care

## 2018-03-06 DIAGNOSIS — C50412 Malignant neoplasm of upper-outer quadrant of left female breast: Secondary | ICD-10-CM | POA: Diagnosis not present

## 2018-03-06 DIAGNOSIS — Z5111 Encounter for antineoplastic chemotherapy: Secondary | ICD-10-CM

## 2018-03-06 LAB — GASTROINTESTINAL PANEL BY PCR, STOOL (REPLACES STOOL CULTURE)
ADENOVIRUS F40/41: NOT DETECTED
ASTROVIRUS: NOT DETECTED
Campylobacter species: NOT DETECTED
Cryptosporidium: NOT DETECTED
Cyclospora cayetanensis: NOT DETECTED
ENTAMOEBA HISTOLYTICA: NOT DETECTED
ENTEROAGGREGATIVE E COLI (EAEC): NOT DETECTED
ENTEROTOXIGENIC E COLI (ETEC): NOT DETECTED
Enteropathogenic E coli (EPEC): NOT DETECTED
GIARDIA LAMBLIA: NOT DETECTED
Norovirus GI/GII: NOT DETECTED
Plesimonas shigelloides: NOT DETECTED
Rotavirus A: NOT DETECTED
Salmonella species: NOT DETECTED
Sapovirus (I, II, IV, and V): NOT DETECTED
Shiga like toxin producing E coli (STEC): NOT DETECTED
Shigella/Enteroinvasive E coli (EIEC): NOT DETECTED
Vibrio cholerae: NOT DETECTED
Vibrio species: NOT DETECTED
Yersinia enterocolitica: NOT DETECTED

## 2018-03-06 LAB — T4, FREE: Free T4: 0.71 ng/dL — ABNORMAL LOW (ref 0.82–1.77)

## 2018-03-06 LAB — C DIFFICILE QUICK SCREEN W PCR REFLEX
C DIFFICLE (CDIFF) ANTIGEN: NEGATIVE
C Diff interpretation: NOT DETECTED
C Diff toxin: NEGATIVE

## 2018-03-07 ENCOUNTER — Encounter: Payer: Self-pay | Admitting: Urgent Care

## 2018-03-07 ENCOUNTER — Inpatient Hospital Stay: Payer: BC Managed Care – PPO

## 2018-03-07 ENCOUNTER — Other Ambulatory Visit: Payer: Self-pay | Admitting: Hematology and Oncology

## 2018-03-07 VITALS — BP 116/72 | HR 92 | Temp 97.0°F | Resp 16

## 2018-03-07 DIAGNOSIS — C50412 Malignant neoplasm of upper-outer quadrant of left female breast: Secondary | ICD-10-CM | POA: Diagnosis not present

## 2018-03-07 DIAGNOSIS — Z170 Estrogen receptor positive status [ER+]: Principal | ICD-10-CM

## 2018-03-07 DIAGNOSIS — Z5111 Encounter for antineoplastic chemotherapy: Secondary | ICD-10-CM

## 2018-03-07 DIAGNOSIS — B370 Candidal stomatitis: Secondary | ICD-10-CM

## 2018-03-07 LAB — CBC WITH DIFFERENTIAL/PLATELET
Basophils Absolute: 0 10*3/uL (ref 0–0.1)
Basophils Relative: 0 %
Eosinophils Absolute: 0 10*3/uL (ref 0–0.7)
Eosinophils Relative: 0 %
HCT: 33.9 % — ABNORMAL LOW (ref 35.0–47.0)
Hemoglobin: 11.6 g/dL — ABNORMAL LOW (ref 12.0–16.0)
Lymphocytes Relative: 5 %
Lymphs Abs: 0.6 10*3/uL — ABNORMAL LOW (ref 1.0–3.6)
MCH: 29.9 pg (ref 26.0–34.0)
MCHC: 34.1 g/dL (ref 32.0–36.0)
MCV: 87.6 fL (ref 80.0–100.0)
Monocytes Absolute: 0.5 10*3/uL (ref 0.2–0.9)
Monocytes Relative: 5 %
Neutro Abs: 10.9 10*3/uL — ABNORMAL HIGH (ref 1.4–6.5)
Neutrophils Relative %: 90 %
Platelets: 293 10*3/uL (ref 150–440)
RBC: 3.88 MIL/uL (ref 3.80–5.20)
RDW: 17.4 % — ABNORMAL HIGH (ref 11.5–14.5)
WBC: 12 10*3/uL — ABNORMAL HIGH (ref 3.6–11.0)

## 2018-03-07 LAB — COMPREHENSIVE METABOLIC PANEL
ALT: 59 U/L — ABNORMAL HIGH (ref 0–44)
AST: 29 U/L (ref 15–41)
Albumin: 4.3 g/dL (ref 3.5–5.0)
Alkaline Phosphatase: 54 U/L (ref 38–126)
Anion gap: 11 (ref 5–15)
BUN: 13 mg/dL (ref 6–20)
CO2: 23 mmol/L (ref 22–32)
Calcium: 9.7 mg/dL (ref 8.9–10.3)
Chloride: 104 mmol/L (ref 98–111)
Creatinine, Ser: 0.57 mg/dL (ref 0.44–1.00)
GFR calc Af Amer: >60 mL/min (ref >60)
GFR calc non Af Amer: >60 mL/min (ref >60)
Glucose, Bld: 176 mg/dL — ABNORMAL HIGH (ref 70–99)
Potassium: 3.8 mmol/L (ref 3.5–5.1)
Sodium: 138 mmol/L (ref 135–145)
Total Bilirubin: 0.6 mg/dL (ref 0.3–1.2)
Total Protein: 7.3 g/dL (ref 6.5–8.1)

## 2018-03-07 LAB — MAGNESIUM: MAGNESIUM: 1.8 mg/dL (ref 1.7–2.4)

## 2018-03-07 MED ORDER — SODIUM CHLORIDE 0.9 % IV SOLN
Freq: Once | INTRAVENOUS | Status: AC
  Administered 2018-03-07: 10:00:00 via INTRAVENOUS
  Filled 2018-03-07: qty 250

## 2018-03-07 MED ORDER — TRASTUZUMAB CHEMO 150 MG IV SOLR
500.0000 mg | Freq: Once | INTRAVENOUS | Status: AC
  Administered 2018-03-07: 500 mg via INTRAVENOUS
  Filled 2018-03-07: qty 23.81

## 2018-03-07 MED ORDER — PALONOSETRON HCL INJECTION 0.25 MG/5ML
0.2500 mg | Freq: Once | INTRAVENOUS | Status: AC
  Administered 2018-03-07: 0.25 mg via INTRAVENOUS
  Filled 2018-03-07: qty 5

## 2018-03-07 MED ORDER — SODIUM CHLORIDE 0.9 % IV SOLN
Freq: Once | INTRAVENOUS | Status: AC
  Administered 2018-03-07: 10:00:00 via INTRAVENOUS
  Filled 2018-03-07: qty 5

## 2018-03-07 MED ORDER — DIPHENHYDRAMINE HCL 25 MG PO CAPS
50.0000 mg | ORAL_CAPSULE | Freq: Once | ORAL | Status: AC
  Administered 2018-03-07: 50 mg via ORAL
  Filled 2018-03-07: qty 2

## 2018-03-07 MED ORDER — SODIUM CHLORIDE 0.9 % IV SOLN
870.0000 mg | Freq: Once | INTRAVENOUS | Status: AC
  Administered 2018-03-07: 870 mg via INTRAVENOUS
  Filled 2018-03-07: qty 87

## 2018-03-07 MED ORDER — SODIUM CHLORIDE 0.9 % IV SOLN
420.0000 mg | Freq: Once | INTRAVENOUS | Status: AC
  Administered 2018-03-07: 420 mg via INTRAVENOUS
  Filled 2018-03-07: qty 14

## 2018-03-07 MED ORDER — SODIUM CHLORIDE 0.9% FLUSH
10.0000 mL | INTRAVENOUS | Status: DC | PRN
  Administered 2018-03-07: 10 mL via INTRAVENOUS
  Filled 2018-03-07: qty 10

## 2018-03-07 MED ORDER — SODIUM CHLORIDE 0.9 % IV SOLN
75.0000 mg/m2 | Freq: Once | INTRAVENOUS | Status: AC
  Administered 2018-03-07: 150 mg via INTRAVENOUS
  Filled 2018-03-07: qty 15

## 2018-03-07 MED ORDER — ACETAMINOPHEN 325 MG PO TABS
650.0000 mg | ORAL_TABLET | Freq: Once | ORAL | Status: AC
  Administered 2018-03-07: 650 mg via ORAL
  Filled 2018-03-07: qty 2

## 2018-03-07 MED ORDER — HEPARIN SOD (PORK) LOCK FLUSH 100 UNIT/ML IV SOLN
500.0000 [IU] | Freq: Once | INTRAVENOUS | Status: AC
  Administered 2018-03-07: 500 [IU] via INTRAVENOUS
  Filled 2018-03-07: qty 5

## 2018-03-07 NOTE — Patient Instructions (Signed)
Carboplatin injection What is this medicine? CARBOPLATIN (KAR boe pla tin) is a chemotherapy drug. It targets fast dividing cells, like cancer cells, and causes these cells to die. This medicine is used to treat ovarian cancer and many other cancers. This medicine may be used for other purposes; ask your health care provider or pharmacist if you have questions. COMMON BRAND NAME(S): Paraplatin What should I tell my health care provider before I take this medicine? They need to know if you have any of these conditions: -blood disorders -hearing problems -kidney disease -recent or ongoing radiation therapy -an unusual or allergic reaction to carboplatin, cisplatin, other chemotherapy, other medicines, foods, dyes, or preservatives -pregnant or trying to get pregnant -breast-feeding How should I use this medicine? This drug is usually given as an infusion into a vein. It is administered in a hospital or clinic by a specially trained health care professional. Talk to your pediatrician regarding the use of this medicine in children. Special care may be needed. Overdosage: If you think you have taken too much of this medicine contact a poison control center or emergency room at once. NOTE: This medicine is only for you. Do not share this medicine with others. What if I miss a dose? It is important not to miss a dose. Call your doctor or health care professional if you are unable to keep an appointment. What may interact with this medicine? -medicines for seizures -medicines to increase blood counts like filgrastim, pegfilgrastim, sargramostim -some antibiotics like amikacin, gentamicin, neomycin, streptomycin, tobramycin -vaccines Talk to your doctor or health care professional before taking any of these medicines: -acetaminophen -aspirin -ibuprofen -ketoprofen -naproxen This list may not describe all possible interactions. Give your health care provider a list of all the medicines, herbs,  non-prescription drugs, or dietary supplements you use. Also tell them if you smoke, drink alcohol, or use illegal drugs. Some items may interact with your medicine. What should I watch for while using this medicine? Your condition will be monitored carefully while you are receiving this medicine. You will need important blood work done while you are taking this medicine. This drug may make you feel generally unwell. This is not uncommon, as chemotherapy can affect healthy cells as well as cancer cells. Report any side effects. Continue your course of treatment even though you feel ill unless your doctor tells you to stop. In some cases, you may be given additional medicines to help with side effects. Follow all directions for their use. Call your doctor or health care professional for advice if you get a fever, chills or sore throat, or other symptoms of a cold or flu. Do not treat yourself. This drug decreases your body's ability to fight infections. Try to avoid being around people who are sick. This medicine may increase your risk to bruise or bleed. Call your doctor or health care professional if you notice any unusual bleeding. Be careful brushing and flossing your teeth or using a toothpick because you may get an infection or bleed more easily. If you have any dental work done, tell your dentist you are receiving this medicine. Avoid taking products that contain aspirin, acetaminophen, ibuprofen, naproxen, or ketoprofen unless instructed by your doctor. These medicines may hide a fever. Do not become pregnant while taking this medicine. Women should inform their doctor if they wish to become pregnant or think they might be pregnant. There is a potential for serious side effects to an unborn child. Talk to your health care professional or  pharmacist for more information. Do not breast-feed an infant while taking this medicine. What side effects may I notice from receiving this medicine? Side effects  that you should report to your doctor or health care professional as soon as possible: -allergic reactions like skin rash, itching or hives, swelling of the face, lips, or tongue -signs of infection - fever or chills, cough, sore throat, pain or difficulty passing urine -signs of decreased platelets or bleeding - bruising, pinpoint red spots on the skin, black, tarry stools, nosebleeds -signs of decreased red blood cells - unusually weak or tired, fainting spells, lightheadedness -breathing problems -changes in hearing -changes in vision -chest pain -high blood pressure -low blood counts - This drug may decrease the number of white blood cells, red blood cells and platelets. You may be at increased risk for infections and bleeding. -nausea and vomiting -pain, swelling, redness or irritation at the injection site -pain, tingling, numbness in the hands or feet -problems with balance, talking, walking -trouble passing urine or change in the amount of urine Side effects that usually do not require medical attention (report to your doctor or health care professional if they continue or are bothersome): -hair loss -loss of appetite -metallic taste in the mouth or changes in taste This list may not describe all possible side effects. Call your doctor for medical advice about side effects. You may report side effects to FDA at 1-800-FDA-1088. Where should I keep my medicine? This drug is given in a hospital or clinic and will not be stored at home. NOTE: This sheet is a summary. It may not cover all possible information. If you have questions about this medicine, talk to your doctor, pharmacist, or health care provider.  2018 Elsevier/Gold Standard (2007-09-10 14:38:05) Docetaxel injection What is this medicine? DOCETAXEL (doe se TAX el) is a chemotherapy drug. It targets fast dividing cells, like cancer cells, and causes these cells to die. This medicine is used to treat many types of cancers  like breast cancer, certain stomach cancers, head and neck cancer, lung cancer, and prostate cancer. This medicine may be used for other purposes; ask your health care provider or pharmacist if you have questions. COMMON BRAND NAME(S): Docefrez, Taxotere What should I tell my health care provider before I take this medicine? They need to know if you have any of these conditions: -infection (especially a virus infection such as chickenpox, cold sores, or herpes) -liver disease -low blood counts, like low white cell, platelet, or red cell counts -an unusual or allergic reaction to docetaxel, polysorbate 80, other chemotherapy agents, other medicines, foods, dyes, or preservatives -pregnant or trying to get pregnant -breast-feeding How should I use this medicine? This drug is given as an infusion into a vein. It is administered in a hospital or clinic by a specially trained health care professional. Talk to your pediatrician regarding the use of this medicine in children. Special care may be needed. Overdosage: If you think you have taken too much of this medicine contact a poison control center or emergency room at once. NOTE: This medicine is only for you. Do not share this medicine with others. What if I miss a dose? It is important not to miss your dose. Call your doctor or health care professional if you are unable to keep an appointment. What may interact with this medicine? -cyclosporine -erythromycin -ketoconazole -medicines to increase blood counts like filgrastim, pegfilgrastim, sargramostim -vaccines Talk to your doctor or health care professional before taking any of these  medicines: -acetaminophen -aspirin -ibuprofen -ketoprofen -naproxen This list may not describe all possible interactions. Give your health care provider a list of all the medicines, herbs, non-prescription drugs, or dietary supplements you use. Also tell them if you smoke, drink alcohol, or use illegal drugs.  Some items may interact with your medicine. What should I watch for while using this medicine? Your condition will be monitored carefully while you are receiving this medicine. You will need important blood work done while you are taking this medicine. This drug may make you feel generally unwell. This is not uncommon, as chemotherapy can affect healthy cells as well as cancer cells. Report any side effects. Continue your course of treatment even though you feel ill unless your doctor tells you to stop. In some cases, you may be given additional medicines to help with side effects. Follow all directions for their use. Call your doctor or health care professional for advice if you get a fever, chills or sore throat, or other symptoms of a cold or flu. Do not treat yourself. This drug decreases your body's ability to fight infections. Try to avoid being around people who are sick. This medicine may increase your risk to bruise or bleed. Call your doctor or health care professional if you notice any unusual bleeding. This medicine may contain alcohol in the product. You may get drowsy or dizzy. Do not drive, use machinery, or do anything that needs mental alertness until you know how this medicine affects you. Do not stand or sit up quickly, especially if you are an older patient. This reduces the risk of dizzy or fainting spells. Avoid alcoholic drinks. Do not become pregnant while taking this medicine. Women should inform their doctor if they wish to become pregnant or think they might be pregnant. There is a potential for serious side effects to an unborn child. Talk to your health care professional or pharmacist for more information. Do not breast-feed an infant while taking this medicine. What side effects may I notice from receiving this medicine? Side effects that you should report to your doctor or health care professional as soon as possible: -allergic reactions like skin rash, itching or hives,  swelling of the face, lips, or tongue -low blood counts - This drug may decrease the number of white blood cells, red blood cells and platelets. You may be at increased risk for infections and bleeding. -signs of infection - fever or chills, cough, sore throat, pain or difficulty passing urine -signs of decreased platelets or bleeding - bruising, pinpoint red spots on the skin, black, tarry stools, nosebleeds -signs of decreased red blood cells - unusually weak or tired, fainting spells, lightheadedness -breathing problems -fast or irregular heartbeat -low blood pressure -mouth sores -nausea and vomiting -pain, swelling, redness or irritation at the injection site -pain, tingling, numbness in the hands or feet -swelling of the ankle, feet, hands -weight gain Side effects that usually do not require medical attention (report to your doctor or health care professional if they continue or are bothersome): -bone pain -complete hair loss including hair on your head, underarms, pubic hair, eyebrows, and eyelashes -diarrhea -excessive tearing -changes in the color of fingernails -loosening of the fingernails -nausea -muscle pain -red flush to skin -sweating -weak or tired This list may not describe all possible side effects. Call your doctor for medical advice about side effects. You may report side effects to FDA at 1-800-FDA-1088. Where should I keep my medicine? This drug is given in a hospital  or clinic and will not be stored at home. NOTE: This sheet is a summary. It may not cover all possible information. If you have questions about this medicine, talk to your doctor, pharmacist, or health care provider.  2018 Elsevier/Gold Standard (2015-07-08 12:32:56) Pertuzumab injection What is this medicine? PERTUZUMAB (per TOOZ ue mab) is a monoclonal antibody. It is used to treat breast cancer. This medicine may be used for other purposes; ask your health care provider or pharmacist if you  have questions. COMMON BRAND NAME(S): PERJETA What should I tell my health care provider before I take this medicine? They need to know if you have any of these conditions: -heart disease -heart failure -high blood pressure -history of irregular heart beat -recent or ongoing radiation therapy -an unusual or allergic reaction to pertuzumab, other medicines, foods, dyes, or preservatives -pregnant or trying to get pregnant -breast-feeding How should I use this medicine? This medicine is for infusion into a vein. It is given by a health care professional in a hospital or clinic setting. Talk to your pediatrician regarding the use of this medicine in children. Special care may be needed. Overdosage: If you think you have taken too much of this medicine contact a poison control center or emergency room at once. NOTE: This medicine is only for you. Do not share this medicine with others. What if I miss a dose? It is important not to miss your dose. Call your doctor or health care professional if you are unable to keep an appointment. What may interact with this medicine? Interactions are not expected. Give your health care provider a list of all the medicines, herbs, non-prescription drugs, or dietary supplements you use. Also tell them if you smoke, drink alcohol, or use illegal drugs. Some items may interact with your medicine. This list may not describe all possible interactions. Give your health care provider a list of all the medicines, herbs, non-prescription drugs, or dietary supplements you use. Also tell them if you smoke, drink alcohol, or use illegal drugs. Some items may interact with your medicine. What should I watch for while using this medicine? Your condition will be monitored carefully while you are receiving this medicine. Report any side effects. Continue your course of treatment even though you feel ill unless your doctor tells you to stop. Do not become pregnant while taking  this medicine or for 7 months after stopping it. Women should inform their doctor if they wish to become pregnant or think they might be pregnant. Women of child-bearing potential will need to have a negative pregnancy test before starting this medicine. There is a potential for serious side effects to an unborn child. Talk to your health care professional or pharmacist for more information. Do not breast-feed an infant while taking this medicine or for 7 months after stopping it. Women must use effective birth control with this medicine. Call your doctor or health care professional for advice if you get a fever, chills or sore throat, or other symptoms of a cold or flu. Do not treat yourself. Try to avoid being around people who are sick. You may experience fever, chills, and headache during the infusion. Report any side effects during the infusion to your health care professional. What side effects may I notice from receiving this medicine? Side effects that you should report to your doctor or health care professional as soon as possible: -breathing problems -chest pain or palpitations -dizziness -feeling faint or lightheaded -fever or chills -skin rash, itching or  hives -sore throat -swelling of the face, lips, or tongue -swelling of the legs or ankles -unusually weak or tired Side effects that usually do not require medical attention (report to your doctor or health care professional if they continue or are bothersome): -diarrhea -hair loss -nausea, vomiting -tiredness This list may not describe all possible side effects. Call your doctor for medical advice about side effects. You may report side effects to FDA at 1-800-FDA-1088. Where should I keep my medicine? This drug is given in a hospital or clinic and will not be stored at home. NOTE: This sheet is a summary. It may not cover all possible information. If you have questions about this medicine, talk to your doctor, pharmacist, or  health care provider.  2018 Elsevier/Gold Standard (2015-07-08 12:08:50)

## 2018-03-08 ENCOUNTER — Inpatient Hospital Stay: Payer: BC Managed Care – PPO

## 2018-03-08 DIAGNOSIS — B370 Candidal stomatitis: Secondary | ICD-10-CM

## 2018-03-08 DIAGNOSIS — C50412 Malignant neoplasm of upper-outer quadrant of left female breast: Secondary | ICD-10-CM

## 2018-03-08 DIAGNOSIS — Z170 Estrogen receptor positive status [ER+]: Secondary | ICD-10-CM

## 2018-03-08 MED ORDER — PEGFILGRASTIM-CBQV 6 MG/0.6ML ~~LOC~~ SOSY
6.0000 mg | PREFILLED_SYRINGE | Freq: Once | SUBCUTANEOUS | Status: AC
  Administered 2018-03-08: 6 mg via SUBCUTANEOUS

## 2018-03-14 ENCOUNTER — Encounter: Payer: Self-pay | Admitting: Urgent Care

## 2018-03-14 ENCOUNTER — Other Ambulatory Visit: Payer: Self-pay | Admitting: *Deleted

## 2018-03-14 ENCOUNTER — Telehealth: Payer: Self-pay

## 2018-03-14 DIAGNOSIS — C50212 Malignant neoplasm of upper-inner quadrant of left female breast: Secondary | ICD-10-CM

## 2018-03-14 DIAGNOSIS — Z170 Estrogen receptor positive status [ER+]: Principal | ICD-10-CM

## 2018-03-14 NOTE — Telephone Encounter (Signed)
Pt returning ABC's call from a couple of weeks ago. She is off work all next week if ABC would want to get together then to drop off that thing. Cb#[phone]

## 2018-03-17 DIAGNOSIS — R7989 Other specified abnormal findings of blood chemistry: Secondary | ICD-10-CM | POA: Insufficient documentation

## 2018-03-17 DIAGNOSIS — Z7189 Other specified counseling: Secondary | ICD-10-CM | POA: Insufficient documentation

## 2018-03-17 DIAGNOSIS — R945 Abnormal results of liver function studies: Secondary | ICD-10-CM

## 2018-03-18 NOTE — Telephone Encounter (Signed)
Spoke with pt

## 2018-03-25 ENCOUNTER — Ambulatory Visit: Payer: BC Managed Care – PPO

## 2018-03-25 ENCOUNTER — Other Ambulatory Visit: Payer: BC Managed Care – PPO

## 2018-03-25 ENCOUNTER — Ambulatory Visit: Payer: BC Managed Care – PPO | Admitting: Hematology and Oncology

## 2018-03-26 ENCOUNTER — Telehealth: Payer: Self-pay | Admitting: *Deleted

## 2018-03-26 ENCOUNTER — Other Ambulatory Visit: Payer: Self-pay | Admitting: Urgent Care

## 2018-03-26 ENCOUNTER — Ambulatory Visit: Payer: BC Managed Care – PPO

## 2018-03-26 DIAGNOSIS — Z5111 Encounter for antineoplastic chemotherapy: Secondary | ICD-10-CM

## 2018-03-26 DIAGNOSIS — Z01818 Encounter for other preprocedural examination: Secondary | ICD-10-CM

## 2018-03-26 NOTE — Telephone Encounter (Signed)
Per Gaspar Bidding on 03/26/18 to schedule a STAT Echo.  STAT Echo is scheduled for 03/27/18 @ 11:00 Pt. must arrive by 10:45 a message was  left on patient vmail to make her aware of the Location, Date and Time  of the scheduled appt.

## 2018-03-27 ENCOUNTER — Ambulatory Visit: Payer: BC Managed Care – PPO

## 2018-03-27 ENCOUNTER — Ambulatory Visit
Admission: RE | Admit: 2018-03-27 | Discharge: 2018-03-27 | Disposition: A | Discharge status: Home / Self Care | Payer: BC Managed Care – PPO | Source: Ambulatory Visit | Attending: Urgent Care | Admitting: Urgent Care

## 2018-03-27 ENCOUNTER — Other Ambulatory Visit: Payer: Self-pay

## 2018-03-27 DIAGNOSIS — I071 Rheumatic tricuspid insufficiency: Secondary | ICD-10-CM | POA: Insufficient documentation

## 2018-03-27 DIAGNOSIS — Z01818 Encounter for other preprocedural examination: Secondary | ICD-10-CM | POA: Insufficient documentation

## 2018-03-27 DIAGNOSIS — G43009 Migraine without aura, not intractable, without status migrainosus: Secondary | ICD-10-CM | POA: Diagnosis not present

## 2018-03-27 DIAGNOSIS — C50412 Malignant neoplasm of upper-outer quadrant of left female breast: Secondary | ICD-10-CM

## 2018-03-27 DIAGNOSIS — Z803 Family history of malignant neoplasm of breast: Secondary | ICD-10-CM | POA: Diagnosis not present

## 2018-03-27 DIAGNOSIS — Z5111 Encounter for antineoplastic chemotherapy: Secondary | ICD-10-CM | POA: Diagnosis not present

## 2018-03-27 DIAGNOSIS — Z170 Estrogen receptor positive status [ER+]: Principal | ICD-10-CM

## 2018-03-27 DIAGNOSIS — Z8041 Family history of malignant neoplasm of ovary: Secondary | ICD-10-CM | POA: Diagnosis not present

## 2018-03-27 NOTE — Progress Notes (Signed)
*  PRELIMINARY RESULTS* Echocardiogram 2D Echocardiogram has been performed.  Denise [name] 03/27/2018, 11:48 AM

## 2018-03-28 ENCOUNTER — Inpatient Hospital Stay: Payer: BC Managed Care – PPO

## 2018-03-28 ENCOUNTER — Encounter: Payer: Self-pay | Admitting: Hematology and Oncology

## 2018-03-28 ENCOUNTER — Other Ambulatory Visit: Payer: Self-pay | Admitting: Hematology and Oncology

## 2018-03-28 ENCOUNTER — Inpatient Hospital Stay: Payer: BC Managed Care – PPO | Attending: Hematology and Oncology | Admitting: Hematology and Oncology

## 2018-03-28 VITALS — BP 126/84 | HR 92 | Temp 97.8°F | Resp 18 | Ht 65.0 in | Wt 181.8 lb

## 2018-03-28 DIAGNOSIS — B370 Candidal stomatitis: Secondary | ICD-10-CM

## 2018-03-28 DIAGNOSIS — C50412 Malignant neoplasm of upper-outer quadrant of left female breast: Secondary | ICD-10-CM | POA: Insufficient documentation

## 2018-03-28 DIAGNOSIS — R7989 Other specified abnormal findings of blood chemistry: Secondary | ICD-10-CM

## 2018-03-28 DIAGNOSIS — R232 Flushing: Secondary | ICD-10-CM | POA: Insufficient documentation

## 2018-03-28 DIAGNOSIS — Z5189 Encounter for other specified aftercare: Secondary | ICD-10-CM | POA: Diagnosis not present

## 2018-03-28 DIAGNOSIS — Z5112 Encounter for antineoplastic immunotherapy: Secondary | ICD-10-CM | POA: Diagnosis not present

## 2018-03-28 DIAGNOSIS — R197 Diarrhea, unspecified: Secondary | ICD-10-CM | POA: Diagnosis not present

## 2018-03-28 DIAGNOSIS — Z5111 Encounter for antineoplastic chemotherapy: Secondary | ICD-10-CM | POA: Insufficient documentation

## 2018-03-28 DIAGNOSIS — C773 Secondary and unspecified malignant neoplasm of axilla and upper limb lymph nodes: Secondary | ICD-10-CM | POA: Diagnosis not present

## 2018-03-28 DIAGNOSIS — N951 Menopausal and female climacteric states: Secondary | ICD-10-CM

## 2018-03-28 DIAGNOSIS — R945 Abnormal results of liver function studies: Secondary | ICD-10-CM

## 2018-03-28 DIAGNOSIS — R739 Hyperglycemia, unspecified: Secondary | ICD-10-CM | POA: Diagnosis not present

## 2018-03-28 DIAGNOSIS — Z170 Estrogen receptor positive status [ER+]: Secondary | ICD-10-CM | POA: Diagnosis not present

## 2018-03-28 DIAGNOSIS — Z23 Encounter for immunization: Secondary | ICD-10-CM | POA: Insufficient documentation

## 2018-03-28 DIAGNOSIS — Z79899 Other long term (current) drug therapy: Secondary | ICD-10-CM | POA: Insufficient documentation

## 2018-03-28 LAB — COMPREHENSIVE METABOLIC PANEL
ALT: 110 U/L — ABNORMAL HIGH (ref 0–44)
AST: 59 U/L — ABNORMAL HIGH (ref 15–41)
Albumin: 4.3 g/dL (ref 3.5–5.0)
Alkaline Phosphatase: 52 U/L (ref 38–126)
Anion gap: 11 (ref 5–15)
BUN: 16 mg/dL (ref 6–20)
CO2: 23 mmol/L (ref 22–32)
Calcium: 9.9 mg/dL (ref 8.9–10.3)
Chloride: 105 mmol/L (ref 98–111)
Creatinine, Ser: 0.63 mg/dL (ref 0.44–1.00)
GFR calc Af Amer: >60 mL/min (ref >60)
GFR calc non Af Amer: >60 mL/min (ref >60)
Glucose, Bld: 221 mg/dL — ABNORMAL HIGH (ref 70–99)
Potassium: 3.9 mmol/L (ref 3.5–5.1)
Sodium: 139 mmol/L (ref 135–145)
Total Bilirubin: 1.1 mg/dL (ref 0.3–1.2)
Total Protein: 7 g/dL (ref 6.5–8.1)

## 2018-03-28 LAB — CBC WITH DIFFERENTIAL/PLATELET
Abs Immature Granulocytes: 0.14 10*3/uL — ABNORMAL HIGH (ref 0.00–0.07)
Basophils Absolute: 0 10*3/uL (ref 0.0–0.1)
Basophils Relative: 0 %
Eosinophils Absolute: 0 10*3/uL (ref 0.0–0.5)
Eosinophils Relative: 0 %
HCT: 34.1 % — ABNORMAL LOW (ref 36.0–46.0)
Hemoglobin: 11.2 g/dL — ABNORMAL LOW (ref 12.0–15.0)
Immature Granulocytes: 1 %
Lymphocytes Relative: 6 %
Lymphs Abs: 0.7 10*3/uL (ref 0.7–4.0)
MCH: 29.9 pg (ref 26.0–34.0)
MCHC: 32.8 g/dL (ref 30.0–36.0)
MCV: 91.2 fL (ref 80.0–100.0)
Monocytes Absolute: 0.2 10*3/uL (ref 0.1–1.0)
Monocytes Relative: 2 %
Neutro Abs: 11.4 10*3/uL — ABNORMAL HIGH (ref 1.7–7.7)
Neutrophils Relative %: 91 %
Platelets: 245 10*3/uL (ref 150–400)
RBC: 3.74 MIL/uL — ABNORMAL LOW (ref 3.87–5.11)
RDW: 17.3 % — ABNORMAL HIGH (ref 11.5–15.5)
WBC: 12.5 10*3/uL — ABNORMAL HIGH (ref 4.0–10.5)
nRBC: 0 % (ref 0.0–0.2)

## 2018-03-28 LAB — PREGNANCY, URINE: Preg Test, Ur: NEGATIVE

## 2018-03-28 LAB — MAGNESIUM: Magnesium: 1.7 mg/dL (ref 1.7–2.4)

## 2018-03-28 MED ORDER — SODIUM CHLORIDE 0.9 % IV SOLN
Freq: Once | INTRAVENOUS | Status: AC
  Administered 2018-03-28: 11:00:00 via INTRAVENOUS
  Filled 2018-03-28: qty 5

## 2018-03-28 MED ORDER — SODIUM CHLORIDE 0.9% FLUSH
10.0000 mL | INTRAVENOUS | Status: DC | PRN
  Administered 2018-03-28: 10 mL via INTRAVENOUS
  Filled 2018-03-28: qty 10

## 2018-03-28 MED ORDER — HEPARIN SOD (PORK) LOCK FLUSH 100 UNIT/ML IV SOLN
500.0000 [IU] | Freq: Once | INTRAVENOUS | Status: AC
  Administered 2018-03-28: 500 [IU] via INTRAVENOUS
  Filled 2018-03-28: qty 5

## 2018-03-28 MED ORDER — PALONOSETRON HCL INJECTION 0.25 MG/5ML
0.2500 mg | Freq: Once | INTRAVENOUS | Status: AC
  Administered 2018-03-28: 0.25 mg via INTRAVENOUS
  Filled 2018-03-28: qty 5

## 2018-03-28 MED ORDER — SODIUM CHLORIDE 0.9 % IV SOLN
420.0000 mg | Freq: Once | INTRAVENOUS | Status: AC
  Administered 2018-03-28: 420 mg via INTRAVENOUS
  Filled 2018-03-28: qty 14

## 2018-03-28 MED ORDER — DIPHENHYDRAMINE HCL 25 MG PO CAPS
50.0000 mg | ORAL_CAPSULE | Freq: Once | ORAL | Status: AC
  Administered 2018-03-28: 50 mg via ORAL
  Filled 2018-03-28: qty 2

## 2018-03-28 MED ORDER — TRASTUZUMAB CHEMO 150 MG IV SOLR
500.0000 mg | Freq: Once | INTRAVENOUS | Status: AC
  Administered 2018-03-28: 500 mg via INTRAVENOUS
  Filled 2018-03-28: qty 23.81

## 2018-03-28 MED ORDER — VENLAFAXINE HCL ER 37.5 MG PO CP24: 37.5000 mg | ORAL_CAPSULE | Freq: Every day | ORAL | 0 refills | Status: DC

## 2018-03-28 MED ORDER — ACETAMINOPHEN 325 MG PO TABS
650.0000 mg | ORAL_TABLET | Freq: Once | ORAL | Status: AC
  Administered 2018-03-28: 650 mg via ORAL
  Filled 2018-03-28: qty 2

## 2018-03-28 MED ORDER — SODIUM CHLORIDE 0.9 % IV SOLN
55.0000 mg/m2 | Freq: Once | INTRAVENOUS | Status: AC
  Administered 2018-03-28: 110 mg via INTRAVENOUS
  Filled 2018-03-28: qty 11

## 2018-03-28 MED ORDER — INFLUENZA VAC SPLIT QUAD 0.5 ML IM SUSY: 0.5000 mL | PREFILLED_SYRINGE | Freq: Once | INTRAMUSCULAR | Status: DC

## 2018-03-28 MED ORDER — SODIUM CHLORIDE 0.9 % IV SOLN
870.0000 mg | Freq: Once | INTRAVENOUS | Status: AC
  Administered 2018-03-28: 870 mg via INTRAVENOUS
  Filled 2018-03-28: qty 87

## 2018-03-28 MED ORDER — SODIUM CHLORIDE 0.9 % IV SOLN
Freq: Once | INTRAVENOUS | Status: AC
  Administered 2018-03-28: 11:00:00 via INTRAVENOUS
  Filled 2018-03-28: qty 250

## 2018-03-28 NOTE — Progress Notes (Signed)
No new changes noted today 

## 2018-03-28 NOTE — Progress Notes (Signed)
AST/ALT levels high today, MD will reduce docetaxel to 75%

## 2018-03-28 NOTE — Progress Notes (Signed)
[facility] day:  03/28/18  Chief Complaint: Denise [name] is a 42 y.o. female with multi-focal Her2/neu + left breast cancer who is seen for assessment prior to cycle #5 neoadjuvant TCHP with Udencya support.   HPI: The patient was last seen in the medical oncology clinic on 03/04/2018. At that time,  patient had diarrhea.  She denied any fevers or associated abdominal pain.  Exam was stable. WBC was 12,800 (ANC 11,600). Magnesium was 1.6. Potassium was 4.0. Glucose 138. TSH was low at 0.169.  Diarrhea was felt possibly related to chemotherapy or Metformin.  Chemotherapy was held.  Stool for C diff and GI panel was negative.  Diarrhea decreased. She received cycle #4 on 03/07/2018 with Udencya support.  She saw Dr. Bary [name] on 03/04/2018.  Notes reviewed.  Ultrasound in the 11 o'clock position of the left breast 3 cm from the nipple the previous biopsy site revealed a 0.48 x 0.77 x 0.83 mass (volume 0.3 cm) which previously measured 3.5 cm.  The left axillary lymph node was less well visualized than prior to treatment, 1.75 cm in maximal diameter (previously 1.86 cm), a 10% change.   Echo on 03/27/2018 revealed an EF of 60-65%.   During the interim, patient is doing "just fine". Bowels have improved with the ER formulation of Metformin. She denies nausea or vomiting. She notes that she has been having difficulties sleeping due to nocturnal hot flashes. She is not having menstrual cycles. Patient denies fevers. She has no significant breast concerns. Patient has been referred to reconstructive surgery. She is scheduled to see Dr. Marla [name] on 04/12/2018 for initial consult.   Patient advises that she maintains an adequate appetite. She is eating well. Weight today is 181 lb 12.8 oz (82.5 kg), which compared to her last visit to the clinic, represents a 1 pound decrease.    Patient denies pain in the clinic today.   Past Medical History:   Diagnosis Date  . Abnormal breast biopsy    [facility], Dr. Bary [name]  . Family history of breast cancer   . Family history of ovarian cancer   . Fibroadenoma of breast, left 2000, 2016  . Genetic screening 08/26/2013   BRCA/BART negative/[facility], CHEK2 POS 2017  . Increased risk of breast cancer 2017   IBIS=47%  . Left breast lump 06/18/2017   Fluid/drained [name]  . Migraine   . Monoallelic mutation of CHEK2 gene in female patient 2017   increased risk of breast and colon cancer    Past Surgical History:  Procedure Laterality Date  . BREAST BIOPSY Bilateral 09/08/14   neg  . BREAST BIOPSY Left 03/16/2015   Procedure: BREAST BIOPSY WITH NEEDLE LOCALIZATION;  Surgeon: [name], MD;  Location: [facility] ORS;  Service: General;  Laterality: Left;  . BREAST CYST ASPIRATION Left 06/18/2017   cyst aspiration  . BREAST EXCISIONAL BIOPSY Left 09/2014  . BREAST EXCISIONAL BIOPSY Right    age 37's  . BREAST SURGERY Right March 2000   benign fibroadenoma  . BREAST SURGERY Left 08/08/13   excision  . BREAST SURGERY Left 09/23/14   excision  . PORTACATH PLACEMENT Right 12/17/2017   Procedure: INSERTION PORT-A-CATH;  Surgeon: [name], MD;  Location: [facility] ORS;  Service: General;  Laterality: Right;    Family History  Problem Relation Age of Onset  . Prostate cancer Father 80  . Breast cancer Paternal Grandmother 27  . Breast cancer Maternal Aunt 60  .  Ovarian cancer Maternal Aunt 70  . Breast cancer Paternal Aunt 26  . Pancreatic cancer Maternal Aunt 75    Social History:  reports that she has never smoked. She has never used smokeless tobacco. She reports that she drinks alcohol. She reports that she does not use drugs.  She does not smoke. She "occasionally" drinks alcohol. 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 Patient denies known exposures to radiation on toxins. Her husband's name is Denise [name]. She has 2 daughters, Denise [name] and Denise [name] (ages 49 1/2 and  30).  Her husband's name is Denise [name].  The patient is accompanied by her husband today.  Allergies:  Allergies  Allergen Reactions  . Tape     Minor skin irritation     Current Medications: Current Outpatient Medications  Medication Sig Dispense Refill  . Blood Glucose Monitoring Suppl (FIFTY50 GLUCOSE METER 2.0) w/Device KIT Use as directed    . glucose blood (PRECISION QID TEST) test strip Use 1 each (1 strip total) once daily Use as instructed.    . loratadine (CLARITIN) 10 MG tablet Take by mouth.    . metFORMIN (GLUCOPHAGE) 500 MG tablet Take 500 mg by mouth 2 (two) times daily with a meal.     . dexamethasone (DECADRON) 4 MG tablet Take 2 tablets (8 mg total) by mouth 2 (two) times daily. Start the day before Taxotere. Take once the day after, then 2 times a day x 2d. (Patient not taking: Reported on 03/28/2018) 30 tablet 2  . HYDROcodone-acetaminophen (NORCO/VICODIN) 5-325 MG tablet Take 1 tablet by mouth every 6 (six) hours as needed for moderate pain. (Patient not taking: Reported on 03/28/2018) 20 tablet 0  . lidocaine-prilocaine (EMLA) cream Apply to affected area once (Patient not taking: Reported on 03/28/2018) 30 g 3  . loperamide (IMODIUM) 2 MG capsule Take by mouth.    . naproxen sodium (ALEVE) 220 MG tablet Take 440 mg by mouth daily as needed (pain).    . nystatin (MYCOSTATIN) 100000 UNIT/ML suspension Swish and swallow 5 cc QID x 5 days, then PRN. (Patient not taking: Reported on 03/28/2018) 240 mL 0  . omeprazole (PRILOSEC) 10 MG capsule Take 10 mg by mouth daily.    . ondansetron (ZOFRAN) 8 MG tablet Take 1 tablet (8 mg total) by mouth every 8 (eight) hours as needed (Nausea or vomiting). (Patient not taking: Reported on 03/28/2018) 30 tablet 3  . prochlorperazine (COMPAZINE) 10 MG tablet Take 1 tablet (10 mg total) by mouth every 6 (six) hours as needed (Nausea or vomiting). (Patient not taking: Reported on 03/28/2018) 30 tablet 1   No current facility-administered  medications for this visit.    Facility-Administered Medications Ordered in Other Visits  Medication Dose Route Frequency Provider Last Rate Last Dose  . heparin lock flush 100 unit/mL  500 Units Intravenous Once Corcoran, Melissa C, MD      . sodium chloride flush (NS) 0.9 % injection 10 mL  10 mL Intravenous PRN Lequita Asal, MD   10 mL at 03/28/18 0905    Review of Systems  Constitutional: Positive for diaphoresis (nocturnal 2/2 hot flashes) and weight loss (down 1 pound). Negative for fever and malaise/fatigue.       Feels "alright".  HENT: Negative.  Negative for congestion, ear discharge, ear pain, hearing loss, nosebleeds, sinus pain, sore throat and tinnitus.   Eyes: Negative.  Negative for blurred vision, double vision, photophobia, pain, discharge and redness.  Respiratory: Negative.  Negative  for cough, hemoptysis, sputum production and shortness of breath.   Cardiovascular: Negative.  Negative for chest pain, palpitations, orthopnea, leg swelling and PND.  Gastrointestinal: Negative for abdominal pain, blood in stool, constipation, diarrhea, melena, nausea and vomiting.       Bowels are "better".  Genitourinary: Negative.  Negative for dysuria, frequency, hematuria and urgency.  Musculoskeletal: Negative.  Negative for back pain, falls, joint pain and myalgias.  Skin: Negative for itching and rash.       Power port to RIGHT chest wall  Neurological: Negative for dizziness, tremors, sensory change, focal weakness, weakness and headaches.  Endo/Heme/Allergies: Does not bruise/bleed easily.       PMH (+) for diabetes - on Metformin.  Blod sugar typically 80-110.  Psychiatric/Behavioral: Negative for depression and memory loss. The patient has insomnia (2/2 hot flashes). The patient is not nervous/anxious.   All other systems reviewed and are negative.  Performance status (ECOG): 1 - Symptomatic but completely ambulatory  Vital Signs BP 126/84 (BP Location: Left Arm,  Patient Position: Sitting)   Pulse 92   Temp 97.8 F (36.6 C) (Tympanic)   Resp 18   Ht 5' 5"  (1.651 m)   Wt 181 lb 12.8 oz (82.5 kg)   SpO2 97%   BMI 30.25 kg/m   Physical Exam  Constitutional: She is oriented to person, place, and time and well-developed, well-nourished, and in no distress.  HENT:  Head: Normocephalic and atraumatic. 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  Wearing a wrap.  Eyes: Pupils are equal, round, and reactive to light. Conjunctivae and EOM are normal. No scleral icterus.  Neck: Normal range of motion. Neck supple. No JVD present.  Cardiovascular: Normal rate, regular rhythm and normal heart sounds. Exam reveals no gallop and no friction rub.  No murmur heard. Pulmonary/Chest: Effort normal and breath sounds normal. No respiratory distress. She has no wheezes. She has no rales.  Power port to RIGHT chest wall  Abdominal: Soft. Bowel sounds are normal. She exhibits no distension and no mass. There is no tenderness. There is no rebound and no guarding.  Musculoskeletal: Normal range of motion. She exhibits no edema or tenderness.  Lymphadenopathy:    She has no cervical adenopathy.    She has no axillary adenopathy.       Right: No inguinal and no supraclavicular adenopathy present.       Left: No inguinal and no supraclavicular adenopathy present.  Neurological: She is alert and oriented to person, place, and time. Gait normal.  Skin: Skin is warm and dry. No rash noted. No erythema.  Psychiatric: Mood, affect and judgment normal.  Nursing note and vitals reviewed.   Infusion on 03/28/2018  Component Date Value Ref Range Status  . Magnesium 03/28/2018 1.7  1.7 - 2.4 mg/dL Final   Performed at [facility], [address], [place], [place] [address]  . Sodium 03/28/2018 139  135 - 145 mmol/L Final  . Potassium 03/28/2018 3.9  3.5 - 5.1 mmol/L Final  . Chloride 03/28/2018 105  98 - 111 mmol/L Final  . CO2 03/28/2018 23  22 - 32 mmol/L  Final  . Glucose, Bld 03/28/2018 221* 70 - 99 mg/dL Final  . BUN 03/28/2018 16  6 - 20 mg/dL Final  . Creatinine, Ser 03/28/2018 0.63  0.44 - 1.00 mg/dL Final  . Calcium 03/28/2018 9.9  8.9 - 10.3 mg/dL Final  . Total Protein 03/28/2018 7.0  6.5 - 8.1 g/dL Final  . Albumin 03/28/2018  4.3  3.5 - 5.0 g/dL Final  . AST 03/28/2018 59* 15 - 41 U/L Final  . ALT 03/28/2018 110* 0 - 44 U/L Final  . Alkaline Phosphatase 03/28/2018 52  38 - 126 U/L Final  . Total Bilirubin 03/28/2018 1.1  0.3 - 1.2 mg/dL Final  . GFR calc non Af Amer 03/28/2018 >60  >60 mL/min Final  . GFR calc Af Amer 03/28/2018 >60  >60 mL/min Final   Comment: (NOTE) The eGFR has been calculated using the CKD EPI equation. This calculation has not been validated in all clinical situations. eGFR's persistently <60 mL/min signify possible Chronic Kidney Disease.   Georgiann Hahn gap 03/28/2018 11  5 - 15 Final   Performed at [facility], [address] [place], [place] [address]  . WBC 03/28/2018 12.5* 4.0 - 10.5 K/uL Final  . RBC 03/28/2018 3.74* 3.87 - 5.11 MIL/uL Final  . Hemoglobin 03/28/2018 11.2* 12.0 - 15.0 g/dL Final  . HCT 03/28/2018 34.1* 36.0 - 46.0 % Final  . MCV 03/28/2018 91.2  80.0 - 100.0 fL Final  . MCH 03/28/2018 29.9  26.0 - 34.0 pg Final  . MCHC 03/28/2018 32.8  30.0 - 36.0 g/dL Final  . RDW 03/28/2018 17.3* 11.5 - 15.5 % Final  . Platelets 03/28/2018 245  150 - 400 K/uL Final  . nRBC 03/28/2018 0.0  0.0 - 0.2 % Final  . Neutrophils Relative % 03/28/2018 91  % Final  . Neutro Abs 03/28/2018 11.4* 1.7 - 7.7 K/uL Final  . Lymphocytes Relative 03/28/2018 6  % Final  . Lymphs Abs 03/28/2018 0.7  0.7 - 4.0 K/uL Final  . Monocytes Relative 03/28/2018 2  % Final  . Monocytes Absolute 03/28/2018 0.2  0.1 - 1.0 K/uL Final  . Eosinophils Relative 03/28/2018 0  % Final  . Eosinophils Absolute 03/28/2018 0.0  0.0 - 0.5 K/uL Final  . Basophils Relative 03/28/2018 0  % Final  . Basophils Absolute 03/28/2018 0.0   0.0 - 0.1 K/uL Final  . Immature Granulocytes 03/28/2018 1  % Final  . Abs Immature Granulocytes 03/28/2018 0.14* 0.00 - 0.07 K/uL Final   Performed at [facility], [address] [place], [place] [address]    Assessment:  Denise [name] is a 42 y.o. female with multi-focal stage IB Her2/neu + left breast cancer s/p index breast mass and axillary node biopsy on 12/06/2017.  Pathology revealed grade II invasive ductal carcinoma with calcifications at the 11 o'clock position.  There was metastatic carcinoma in 1 of 1 lymph nodes.  Tumor was ER + (100%), PR + (100%), and Ki67 5%.  Her2/neu was 3+ by IHC (heterogeneous) and FISH +.  Diagnostic left mammogram and ultrasound on 12/06/2017 revealed a 1.7 x 1.3 x 1.3 cm irregular hypoechoic mass with internal calcifications at the 11 o'clock position 2 cm from the nipple with internal calcifications. There was a similar appearing 0.9 x 0.8 x 0.7 cm mass at the 11 o'clock position 4 cm from the nipple (2.4 cm from the index lesion).  There were suspicious, segmental calcifications originating from the index lesion and extending 5 cm posteriorly.  There was a 0.6 cm morphologically abnormal left axillary lymph node.  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 The adjacent and more superiorly located 11 o'clock mass (7 x 12 mm) seen on recent ultrasound, 4  cm from the nipple on ultrasound, was also identified. There were 2 probable satellite lesions (7 mm, medial lesion; posterior and lateral lesion). The total span of malignancy was 3 x 3.1 x 4.5 cm.  There were calcifications extending up to 5 cm posterior to the biopsied malignancy which were highly suspicious on mammography but not appreciated.  There was a mass in the lower outer left breast measured 5 mm, representing a change.  The known metastatic node in the left axilla was  identified. A node along the posterolateral margin of the pectoralis minor (cortex 5 mm) was nonspecific but at least somewhat suspicious.  Chest, abdomen, and pelvic CT on 12/19/2017 revealed a solitary enlarged biopsy-proven metastatic left axillary lymph node.  There were no additional findings suspicious for metastatic disease in the chest, abdomen or pelvis.  There was a nonspecific tiny sclerotic upper right sacral lesion, more likely a benign bone island. Bone scintigraphy correlation versus follow-up CT could be considered.  Bone scan on 01/11/2018 was negative for metastatic pattern. No areas of focal tracer uptake. Area of concern in sacrum on CT likely represents benign bone island.   [facility] genetic testing on 08/26/2013 was negative for BRCA1 and 2. 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  She has a family history significant for breast, ovarian, pancreatic, and prostate cancer.  She is s/p 4 cycles of TCHP chemotherapy (12/21/2017 - 03/07/2018) with Margarette Canada support. Cycle #3 was held due to elevated LFTs on 02/04/2018. She is tolerating treatment well.  Echo on 12/20/2017 revealed an EF of 60-65%.  Echo on 03/27/2018 revealed an EF of 60-65%.  She has a history of elevated LFTs.  Hepatitis B and C serologies were negative on 02/04/2018.  RUQ ultrasound on 02/06/2018 revealed a small anterior gallbladder wall polyp. There was no evidence of cholelithiasis or cholecystis. CBD measured normal at 2 mm, with no evidence of choledocholithiasis. There was normal direction of blood flow towards the liver noted.  Symptomatically, she is doing well overall. She is having difficulties sleeping secondary to developing nocturnal diaphoresis (hot flashes). No nausea or vomiting. Bowels have improved with the change in her Metformin to the ER formulation. No breast concerns. Exam is grossly unremarkable.  WBC 12,500 (ANC 11,400).  Platelets 245,000.  Blood sugar elevated 221.   AST 59, ALT 110, ALP 52, and total bilirubin 1.1.  Magnesium normal 1.7.  Urine pregnancy test negative.  Plan: 1. Labs:  CBC with diff, CMP, Mg. 2. Multifocal stage IB LEFT breast cancer Doing well overall. Tolerating treatments with minimal side effects. Discuss recent visit with surgery Denise Castilla, MD). Ultrasound demonstrates improvement. Mass measures 0.48 x 0.77 x 0.83 cm (previously 3.5 cm). Labs reviewed. Blood counts stable and adequate enough for treatment. Will proceed with cycle #5 neoadjuvant TCHP with Udencya support.  Discuss symptom management.  Patient has antiemetics and pain medications at home to use on a PRN basis. Patient  advising that the  prescribed interventions are adequate at this point. Continue all medications as previously prescribed.  3. Transaminitis  Labs reviewed. AST 59, ALT 110, ALP 52, and total bilirubin 1.1.  Discuss need for potential dose reduction with pharmacy prior to today's treatment. 4. Vasomotor symptoms  Related to current chemotherapy regimen.   Significant symptoms that are affecting patient's quality of life and ability to sleep well at night.   Will try low dose venlafaxine XR 37.5 mg daily. 5. Hyperglycemia  Glucose elevated at 221 today.  Continues on  oral steroids as part of her pretreatment regimen.  Recent change in Metformin to the extended release formulation.  Patient followed regularly by PCP. 6. Electrolyte derangements  CMP demonstrates normal electrolytes today. No supplementation needed.   Continue efforts to increase oral hydration with electrolyte fortified fluids duration and further electrolyte derangements.  We will continue routine lab monitoring. 7. Diarrhea  Improved significantly with change to Metformin ER.   Bowels are well controlled at this time.  8. Annual influenza vaccination  Patient requesting annual influenza vaccination. Discussed risks and benefits of vaccination.    Patient took  steroid dose as part of her premedication regimen for her chemotherapy treatments.    Encouraged patient to hold off on vaccination x1 week.  Orders placed for Fluarix to be given on 04/05/2018.  In the interim, patient was encouraged to avoid sick contacts and ensure strict handwashing as we are at the beginning of influenza season. blood counts reviewed and found to be adequate enough for patient to proceed with vaccination.  9. RTC on 04/05/2018 for influenza vaccination.  10. RTC on 04/17/2018 for MD assessment, labs (CBC with diff, CMP, Mg), and cycle #6 TCHP with Udencya support   Honor Loh, NP  03/28/18, 9:46 AM   I saw and evaluated the patient, participating in the key portions of the service and reviewing pertinent diagnostic studies and records.  I reviewed the nurse practitioner's note and agree with the findings and the plan.  The assessment and plan were discussed with the patient.  Multiple questions were asked by the patient and answered.    Nolon Stalls, MD 03/28/2018,9:46 AM

## 2018-03-28 NOTE — Patient Instructions (Signed)
Venlafaxine tablets What is this medicine? VENLAFAXINE (VEN la fax een) is used to treat depression, anxiety and panic disorder. This medicine may be used for other purposes; ask your health care provider or pharmacist if you have questions. COMMON BRAND NAME(S): Effexor What should I tell my health care provider before I take this medicine? They need to know if you have any of these conditions: -bleeding disorders -glaucoma -heart disease -high blood pressure -high cholesterol -kidney disease -liver disease -low levels of sodium in the blood -mania or bipolar disorder -seizures -suicidal thoughts, plans, or attempt; a previous suicide attempt by you or a family -take medicines that treat or prevent blood clots -thyroid disease -an unusual or allergic reaction to venlafaxine, desvenlafaxine, other medicines, foods, dyes, or preservatives -pregnant or trying to get pregnant -breast-feeding How should I use this medicine? Take this medicine by mouth with a glass of water. Follow the directions on the prescription label. Take it with food. Take your medicine at regular intervals. Do not take your medicine more often than directed. Do not stop taking this medicine suddenly except upon the advice of your doctor. Stopping this medicine too quickly may cause serious side effects or your condition may worsen. A special MedGuide will be given to you by the pharmacist with each prescription and refill. Be sure to read this information carefully each time. Talk to your pediatrician regarding the use of this medicine in children. Special care may be needed. Overdosage: If you think you have taken too much of this medicine contact a poison control center or emergency room at once. NOTE: This medicine is only for you. Do not share this medicine with others. What if I miss a dose? If you miss a dose, take it as soon as you can. If it is almost time for your next dose, take only that dose. Do not take  double or extra doses. What may interact with this medicine? Do not take this medicine with any of the following medications: -certain medicines for fungal infections like fluconazole, itraconazole, ketoconazole, posaconazole, voriconazole -cisapride -desvenlafaxine -dofetilide -dronedarone -duloxetine -levomilnacipran -linezolid -MAOIs like Carbex, Eldepryl, Marplan, Nardil, and Parnate -methylene blue (injected into a vein) -milnacipran -pimozide -thioridazine -ziprasidone This medicine may also interact with the following medications: -amphetamines -aspirin and aspirin-like medicines -certain medicines for depression, anxiety, or psychotic disturbances -certain medicines for migraine headaches like almotriptan, eletriptan, frovatriptan, naratriptan, rizatriptan, sumatriptan, zolmitriptan -certain medicines for sleep -certain medicines that treat or prevent blood clots like dalteparin, enoxaparin, warfarin -cimetidine -clozapine -diuretics -fentanyl -furazolidone -indinavir -isoniazid -lithium -metoprolol -NSAIDS, medicines for pain and inflammation, like ibuprofen or naproxen -other medicines that prolong the QT interval (cause an abnormal heart rhythm) -procarbazine -rasagiline -supplements like St. John's wort, kava kava, valerian -tramadol -tryptophan This list may not describe all possible interactions. Give your health care provider a list of all the medicines, herbs, non-prescription drugs, or dietary supplements you use. Also tell them if you smoke, drink alcohol, or use illegal drugs. Some items may interact with your medicine. What should I watch for while using this medicine? Tell your doctor if your symptoms do not get better or if they get worse. Visit your doctor or health care professional for regular checks on your progress. Because it may take several weeks to see the full effects of this medicine, it is important to continue your treatment as prescribed  by your doctor. Patients and their families should watch out for new or worsening thoughts of suicide or depression. Also   watch out for sudden changes in feelings such as feeling anxious, agitated, panicky, irritable, hostile, aggressive, impulsive, severely restless, overly excited and hyperactive, or not being able to sleep. If this happens, especially at the beginning of treatment or after a change in dose, call your health care professional. This medicine can cause an increase in blood pressure. Check with your doctor for instructions on monitoring your blood pressure while taking this medicine. You may get drowsy or dizzy. Do not drive, use machinery, or do anything that needs mental alertness until you know how this medicine affects you. Do not stand or sit up quickly, especially if you are an older patient. This reduces the risk of dizzy or fainting spells. Alcohol may interfere with the effect of this medicine. Avoid alcoholic drinks. Your mouth may get dry. Chewing sugarless gum, sucking hard candy and drinking plenty of water will help. Contact your doctor if the problem does not go away or is severe. What side effects may I notice from receiving this medicine? Side effects that you should report to your doctor or health care professional as soon as possible: -allergic reactions like skin rash, itching or hives, swelling of the face, lips, or tongue -anxious -breathing problems -confusion -changes in vision -chest pain -confusion -elevated mood, decreased need for sleep, racing thoughts, impulsive behavior -eye pain -fast, irregular heartbeat -feeling faint or lightheaded, falls -feeling agitated, angry, or irritable -hallucination, loss of contact with reality -high blood pressure -loss of balance or coordination -palpitations -redness, blistering, peeling or loosening of the skin, including inside the mouth -restlessness, pacing, inability to keep still -seizures -stiff  muscles -suicidal thoughts or other mood changes -trouble passing urine or change in the amount of urine -trouble sleeping -unusual bleeding or bruising -unusually weak or tired -vomiting Side effects that usually do not require medical attention (report to your doctor or health care professional if they continue or are bothersome): -change in sex drive or performance -change in appetite or weight -constipation -dizziness -dry mouth -headache -increased sweating -nausea -tired This list may not describe all possible side effects. Call your doctor for medical advice about side effects. You may report side effects to FDA at 1-800-FDA-1088. Where should I keep my medicine? Keep out of the reach of children. Store at a controlled temperature between 20 and 25 degrees C (68 and 77 degrees F), in a dry place. Throw away any unused medicine after the expiration date. NOTE: This sheet is a summary. It may not cover all possible information. If you have questions about this medicine, talk to your doctor, pharmacist, or health care provider.  2018 Elsevier/Gold Standard (2015-11-04 18:42:26)

## 2018-03-29 ENCOUNTER — Inpatient Hospital Stay: Payer: BC Managed Care – PPO

## 2018-03-29 DIAGNOSIS — C50412 Malignant neoplasm of upper-outer quadrant of left female breast: Secondary | ICD-10-CM

## 2018-03-29 DIAGNOSIS — B370 Candidal stomatitis: Secondary | ICD-10-CM

## 2018-03-29 DIAGNOSIS — Z170 Estrogen receptor positive status [ER+]: Secondary | ICD-10-CM

## 2018-03-29 MED ORDER — PEGFILGRASTIM-CBQV 6 MG/0.6ML ~~LOC~~ SOSY
6.0000 mg | PREFILLED_SYRINGE | Freq: Once | SUBCUTANEOUS | Status: AC
  Administered 2018-03-29: 6 mg via SUBCUTANEOUS

## 2018-04-04 ENCOUNTER — Encounter: Payer: Self-pay | Admitting: Urgent Care

## 2018-04-05 ENCOUNTER — Inpatient Hospital Stay: Payer: BC Managed Care – PPO | Admitting: Urgent Care

## 2018-04-05 ENCOUNTER — Ambulatory Visit (INDEPENDENT_AMBULATORY_CARE_PROVIDER_SITE_OTHER): Payer: BC Managed Care – PPO | Admitting: Plastic Surgery

## 2018-04-05 ENCOUNTER — Inpatient Hospital Stay: Payer: BC Managed Care – PPO

## 2018-04-05 ENCOUNTER — Encounter: Payer: Self-pay | Admitting: Plastic Surgery

## 2018-04-05 VITALS — BP 110/60 | HR 110 | Resp 16 | Ht 67.0 in | Wt 180.0 lb

## 2018-04-05 DIAGNOSIS — Z23 Encounter for immunization: Secondary | ICD-10-CM

## 2018-04-05 DIAGNOSIS — C50412 Malignant neoplasm of upper-outer quadrant of left female breast: Secondary | ICD-10-CM

## 2018-04-05 DIAGNOSIS — Z170 Estrogen receptor positive status [ER+]: Secondary | ICD-10-CM

## 2018-04-05 MED ORDER — INFLUENZA VAC SPLIT QUAD 0.5 ML IM SUSY
0.5000 mL | PREFILLED_SYRINGE | Freq: Once | INTRAMUSCULAR | Status: AC
  Administered 2018-04-05: 0.5 mL via INTRAMUSCULAR

## 2018-04-05 NOTE — Progress Notes (Signed)
Patient ID: Denise [name], female    DOB: 11-04-1975, 42 y.o.   MRN: [id number]   Chief Complaint  Patient presents with  . Breast Cancer    The patient is a 42 year old white female here with her husband for consultation for breast reconstruction.  She has had several biopsies of her breast over the past several years.  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 The biopsy was done in June 2019 at the 11 o'clock position of the left breast.  One lymph node was positive.  The tumor was ER / PR positive and Ki-67 5%.  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  She has a very strong history in her family of breast cancer.  She denies any nicotine use. She is 5 feet 7 inches tall, weight 180 pounds, preop bra = 36 C.  She is interested in bilateral mastectomies.  She is aware Dr. Tollie Pizza is thinking to focus on the left side first.  She is open to that suggestion but would like a little more time to think about it.  She is still getting chemotherapy and this will be finished the end of October.  She is hoping to have her surgery the beginning of December.   Review of Systems  Constitutional: Negative.  Negative for activity change and appetite change.  HENT: Negative.   Eyes: Negative.   Respiratory: Negative.  Negative for shortness of breath.   Cardiovascular: Negative.   Gastrointestinal: Negative.   Endocrine: Negative.   Genitourinary: Negative.   Musculoskeletal: Negative.   Skin: Negative.  Negative for color change and wound.  Neurological: Negative.   Hematological: Negative.   Psychiatric/Behavioral: Negative.     Past Medical History:  Diagnosis Date  . Abnormal breast biopsy    [facility], [name]  . Family history of breast cancer   . Family history of ovarian cancer   . Fibroadenoma of breast, left 2000, 2016  . Genetic screening 08/26/2013   BRCA/BART  negative/[facility], CHEK2 POS 2017  . Increased risk of breast cancer 2017   IBIS=47%  . Left breast lump 06/18/2017   Fluid/drained [name]  . Migraine   . Monoallelic mutation of CHEK2 gene in female patient 2017   increased risk of breast and colon cancer    Past Surgical History:  Procedure Laterality Date  . BREAST BIOPSY Bilateral 09/08/14   neg  . BREAST BIOPSY Left 03/16/2015   Procedure: BREAST BIOPSY WITH NEEDLE LOCALIZATION;  Surgeon: [name], MD;  Location: [facility] ORS;  Service: General;  Laterality: Left;  . BREAST CYST ASPIRATION Left 06/18/2017   cyst aspiration  . BREAST EXCISIONAL BIOPSY Left 09/2014  . BREAST EXCISIONAL BIOPSY Right    age 42's  . BREAST SURGERY Right March 2000   benign fibroadenoma  . BREAST SURGERY Left 08/08/13   excision  . BREAST SURGERY Left 09/23/14   excision  . PORTACATH PLACEMENT Right 12/17/2017   Procedure: INSERTION PORT-A-CATH;  Surgeon: [name], MD;  Location: [facility] OR[facility];  Service: General;  Laterality: Right;      Current Outpatient Medications:  .  Blood Glucose Monitoring Suppl (FIFTY50 GLUCOSE METER 2.0) w/Device KIT, Use as directed, Disp: , Rfl:  .  dexamethasone (DECADRON) 4 MG tablet, Take 2 tablets (8 mg total) by mouth 2 (two) times daily. Start the day  before Taxotere. Take once the day after, then 2 times a day x 2d., Disp: 30 tablet, Rfl: 2 .  glucose blood (PRECISION QID TEST) test strip, Use 1 each (1 strip total) once daily Use as instructed., Disp: , Rfl:  .  lidocaine-prilocaine (EMLA) cream, Apply to affected area once, Disp: 30 g, Rfl: 3 .  loperamide (IMODIUM) 2 MG capsule, Take by mouth., Disp: , Rfl:  .  loratadine (CLARITIN) 10 MG tablet, Take by mouth., Disp: , Rfl:  .  metFORMIN (GLUCOPHAGE) 500 MG tablet, Take 500 mg by mouth 2 (two) times daily with a meal. , Disp: , Rfl:  .  naproxen sodium (ALEVE) 220 MG tablet, Take 440 mg by mouth daily as needed (pain)., Disp: , Rfl:  .  nystatin  (MYCOSTATIN) 100000 UNIT/ML suspension, Swish and swallow 5 cc QID x 5 days, then PRN., Disp: 240 mL, Rfl: 0 .  omeprazole (PRILOSEC) 10 MG capsule, Take 10 mg by mouth daily., Disp: , Rfl:  .  ondansetron (ZOFRAN) 8 MG tablet, Take 1 tablet (8 mg total) by mouth every 8 (eight) hours as needed (Nausea or vomiting)., Disp: 30 tablet, Rfl: 3 .  prochlorperazine (COMPAZINE) 10 MG tablet, Take 1 tablet (10 mg total) by mouth every 6 (six) hours as needed (Nausea or vomiting)., Disp: 30 tablet, Rfl: 1 .  venlafaxine XR (EFFEXOR-XR) 37.5 MG 24 hr capsule, Take 1 capsule (37.5 mg total) by mouth daily with breakfast., Disp: 30 capsule, Rfl: 0 No current facility-administered medications for this visit.   Facility-Administered Medications Ordered in Other Visits:  .  Influenza vac split quadrivalent PF (FLUARIX) injection 0.5 mL, 0.5 mL, Intramuscular, Once, Karen Kitchens, NP   Objective:   Vitals:   04/05/18 1014  BP: 110/60  Pulse: (!) 110  Resp: 16  SpO2: 98%    Physical Exam  Constitutional: She is oriented to person, place, and time. She appears well-developed and well-nourished.  HENT:  Head: Normocephalic and atraumatic.  Eyes: Pupils are equal, round, and reactive to light. EOM are normal.  Cardiovascular: Normal rate.  Pulmonary/Chest: Effort normal. No respiratory distress. She exhibits no tenderness.  Abdominal: Soft. She exhibits no distension.  Neurological: She is alert and oriented to person, place, and time.  Skin: Skin is warm.  Psychiatric: She has a normal mood and affect. Her behavior is normal. Thought content normal.    Assessment & Plan:  Malignant neoplasm of upper-outer quadrant of left breast in female, estrogen receptor positive ([place])   Assessment and Plan:  A long, detailed conversation was had regarding the patient's options for breast reconstruction. Five main points, which are explained to all breast reconstruction patients, were discussed.  1. Breast  reconstruction is an optional process.  2. Breast reconstruction is a multi-stage process which involves multiple surgeries spaced several months apart. The entire process can take over one year.  3. The major goal of breast reconstruction is to have the patient look normal in clothing. When naked, there will always be scars.  4. Asymmetries are often present during the reconstruction process. Several operations may be needed, including surgery to the non-cancerous breast, to achieve satisfactory results.  5. No matter the reconstructive method, there are ways that the reconstruction can fail and a secondary reconstructive plan would need to be created.   A general discussion regarding all available methods of breast reconstruction were discussed. The types of reconstructions described included.  1. Tissue expander and implant based reconstruction, both single and  multi-stage approaches.  2. Autologous only reconstructions, including free abdominal-tissue based reconstructions.  3. Combination procedures, particularly latissismus dorsi flaps combined with either expanders or implants.  For each of the reconstruction methods mentioned above, the risks, benefits, alternatives, scarring, and recovery time were discussed in great detail. Specific risks detailed included bleeding, infection, hematoma, seroma, scarring, pain, wound healing complications, flap loss, fat necrosis, capsular contracture, need for implant removal, donor site complications, bulge, hernia, umbilical necrosis, need for urgent reoperation, and need for dressing changes were discussed.   Assessment  Once all reconstruction options were presented, a focused discussion was had regarding the patient's suitability for each of these procedures.  A total of 50 minutes of face-to-face time was spent in this encounter, of which >50% was spent in counseling. The patient is interested in bilateral mastectomies.  She would like immediate  reconstruction with expanders and flex HD.  She is open to the suggestion of waiting to have the prophylactic mastectomy on the right until after she has completely healed up on the LEFT.  She needs a little more time to think about it.  She will let Dr. Tollie Pizza know next week.  We will plan for the beginning of December for her surgery.  Dr. Tollie Pizza is aware that I saw her today.  [place], DO

## 2018-04-06 ENCOUNTER — Encounter: Payer: Self-pay | Admitting: Urgent Care

## 2018-04-06 MED ORDER — NYSTATIN 100000 UNIT/ML MT SUSP: OROMUCOSAL | 1 refills | Status: DC

## 2018-04-17 ENCOUNTER — Encounter: Payer: Self-pay | Admitting: Hematology and Oncology

## 2018-04-17 ENCOUNTER — Other Ambulatory Visit: Payer: Self-pay | Admitting: Hematology and Oncology

## 2018-04-17 ENCOUNTER — Inpatient Hospital Stay: Payer: BC Managed Care – PPO

## 2018-04-17 ENCOUNTER — Inpatient Hospital Stay (HOSPITAL_BASED_OUTPATIENT_CLINIC_OR_DEPARTMENT_OTHER): Payer: BC Managed Care – PPO | Admitting: Hematology and Oncology

## 2018-04-17 VITALS — BP 123/85 | HR 98 | Temp 96.7°F | Resp 18 | Wt 183.2 lb

## 2018-04-17 DIAGNOSIS — R197 Diarrhea, unspecified: Secondary | ICD-10-CM

## 2018-04-17 DIAGNOSIS — Z170 Estrogen receptor positive status [ER+]: Secondary | ICD-10-CM | POA: Diagnosis not present

## 2018-04-17 DIAGNOSIS — Z5111 Encounter for antineoplastic chemotherapy: Secondary | ICD-10-CM

## 2018-04-17 DIAGNOSIS — R739 Hyperglycemia, unspecified: Secondary | ICD-10-CM

## 2018-04-17 DIAGNOSIS — C773 Secondary and unspecified malignant neoplasm of axilla and upper limb lymph nodes: Secondary | ICD-10-CM

## 2018-04-17 DIAGNOSIS — B370 Candidal stomatitis: Secondary | ICD-10-CM

## 2018-04-17 DIAGNOSIS — C50412 Malignant neoplasm of upper-outer quadrant of left female breast: Secondary | ICD-10-CM | POA: Diagnosis not present

## 2018-04-17 DIAGNOSIS — Z5112 Encounter for antineoplastic immunotherapy: Secondary | ICD-10-CM

## 2018-04-17 DIAGNOSIS — N951 Menopausal and female climacteric states: Secondary | ICD-10-CM | POA: Insufficient documentation

## 2018-04-17 DIAGNOSIS — Z79899 Other long term (current) drug therapy: Secondary | ICD-10-CM

## 2018-04-17 LAB — CBC WITH DIFFERENTIAL/PLATELET
Abs Immature Granulocytes: 0.09 10*3/uL — ABNORMAL HIGH (ref 0.00–0.07)
Basophils Absolute: 0 10*3/uL (ref 0.0–0.1)
Basophils Relative: 0 %
Eosinophils Absolute: 0 10*3/uL (ref 0.0–0.5)
Eosinophils Relative: 0 %
HCT: 31.7 % — ABNORMAL LOW (ref 36.0–46.0)
Hemoglobin: 10.6 g/dL — ABNORMAL LOW (ref 12.0–15.0)
Immature Granulocytes: 1 %
Lymphocytes Relative: 4 %
Lymphs Abs: 0.5 10*3/uL — ABNORMAL LOW (ref 0.7–4.0)
MCH: 31.5 pg (ref 26.0–34.0)
MCHC: 33.4 g/dL (ref 30.0–36.0)
MCV: 94.1 fL (ref 80.0–100.0)
Monocytes Absolute: 0.4 10*3/uL (ref 0.1–1.0)
Monocytes Relative: 3 %
Neutro Abs: 11 10*3/uL — ABNORMAL HIGH (ref 1.7–7.7)
Neutrophils Relative %: 92 %
Platelets: 187 10*3/uL (ref 150–400)
RBC: 3.37 MIL/uL — ABNORMAL LOW (ref 3.87–5.11)
RDW: 16.4 % — ABNORMAL HIGH (ref 11.5–15.5)
WBC: 12 10*3/uL — ABNORMAL HIGH (ref 4.0–10.5)
nRBC: 0 % (ref 0.0–0.2)

## 2018-04-17 LAB — MAGNESIUM: Magnesium: 1.5 mg/dL — ABNORMAL LOW (ref 1.7–2.4)

## 2018-04-17 LAB — COMPREHENSIVE METABOLIC PANEL
ALT: 53 U/L — ABNORMAL HIGH (ref 0–44)
AST: 50 U/L — ABNORMAL HIGH (ref 15–41)
Albumin: 4.1 g/dL (ref 3.5–5.0)
Alkaline Phosphatase: 56 U/L (ref 38–126)
Anion gap: 11 (ref 5–15)
BUN: 12 mg/dL (ref 6–20)
CO2: 21 mmol/L — ABNORMAL LOW (ref 22–32)
Calcium: 9.1 mg/dL (ref 8.9–10.3)
Chloride: 104 mmol/L (ref 98–111)
Creatinine, Ser: 0.65 mg/dL (ref 0.44–1.00)
GFR calc Af Amer: >60 mL/min (ref >60)
GFR calc non Af Amer: >60 mL/min (ref >60)
Glucose, Bld: 218 mg/dL — ABNORMAL HIGH (ref 70–99)
Potassium: 3.6 mmol/L (ref 3.5–5.1)
Sodium: 136 mmol/L (ref 135–145)
Total Bilirubin: 0.6 mg/dL (ref 0.3–1.2)
Total Protein: 6.8 g/dL (ref 6.5–8.1)

## 2018-04-17 LAB — PREGNANCY, URINE: Preg Test, Ur: NEGATIVE

## 2018-04-17 MED ORDER — SODIUM CHLORIDE 0.9 % IV SOLN
Freq: Once | INTRAVENOUS | Status: AC
  Administered 2018-04-17: 11:00:00 via INTRAVENOUS
  Filled 2018-04-17: qty 5

## 2018-04-17 MED ORDER — ACETAMINOPHEN 325 MG PO TABS
650.0000 mg | ORAL_TABLET | Freq: Once | ORAL | Status: AC
  Administered 2018-04-17: 650 mg via ORAL
  Filled 2018-04-17: qty 2

## 2018-04-17 MED ORDER — MAGNESIUM SULFATE 2 GM/50ML IV SOLN
2.0000 g | Freq: Once | INTRAVENOUS | Status: AC
  Administered 2018-04-17: 2 g via INTRAVENOUS
  Filled 2018-04-17: qty 50

## 2018-04-17 MED ORDER — SODIUM CHLORIDE 0.9 % IV SOLN
870.0000 mg | Freq: Once | INTRAVENOUS | Status: AC
  Administered 2018-04-17: 870 mg via INTRAVENOUS
  Filled 2018-04-17: qty 87

## 2018-04-17 MED ORDER — PALONOSETRON HCL INJECTION 0.25 MG/5ML
0.2500 mg | Freq: Once | INTRAVENOUS | Status: AC
  Administered 2018-04-17: 0.25 mg via INTRAVENOUS
  Filled 2018-04-17: qty 5

## 2018-04-17 MED ORDER — DIPHENHYDRAMINE HCL 25 MG PO CAPS
50.0000 mg | ORAL_CAPSULE | Freq: Once | ORAL | Status: AC
  Administered 2018-04-17: 50 mg via ORAL
  Filled 2018-04-17: qty 2

## 2018-04-17 MED ORDER — SODIUM CHLORIDE 0.9 % IV SOLN
Freq: Once | INTRAVENOUS | Status: AC
  Administered 2018-04-17: 10:00:00 via INTRAVENOUS
  Filled 2018-04-17: qty 250

## 2018-04-17 MED ORDER — HEPARIN SOD (PORK) LOCK FLUSH 100 UNIT/ML IV SOLN
500.0000 [IU] | Freq: Once | INTRAVENOUS | Status: AC
  Administered 2018-04-17: 500 [IU] via INTRAVENOUS
  Filled 2018-04-17: qty 5

## 2018-04-17 MED ORDER — TRASTUZUMAB CHEMO 150 MG IV SOLR
500.0000 mg | Freq: Once | INTRAVENOUS | Status: AC
  Administered 2018-04-17: 500 mg via INTRAVENOUS
  Filled 2018-04-17: qty 23.81

## 2018-04-17 MED ORDER — SODIUM CHLORIDE 0.9 % IV SOLN
75.0000 mg/m2 | Freq: Once | INTRAVENOUS | Status: AC
  Administered 2018-04-17: 150 mg via INTRAVENOUS
  Filled 2018-04-17: qty 15

## 2018-04-17 MED ORDER — SODIUM CHLORIDE 0.9 % IV SOLN
420.0000 mg | Freq: Once | INTRAVENOUS | Status: AC
  Administered 2018-04-17: 420 mg via INTRAVENOUS
  Filled 2018-04-17: qty 14

## 2018-04-17 MED ORDER — SODIUM CHLORIDE 0.9% FLUSH
10.0000 mL | INTRAVENOUS | Status: AC | PRN
  Administered 2018-04-17: 10 mL via INTRAVENOUS
  Filled 2018-04-17: qty 10

## 2018-04-17 NOTE — Progress Notes (Signed)
[facility] day:  04/17/18  Chief Complaint: Denise [name] is a 42 y.o. female with multi-focal Her2/neu + left breast cancer who is seen for assessment prior to cycle #6 neoadjuvant TCHP with Udencya support.   HPI: The patient was last seen in the medical oncology clinic on 03/28/2018.  At that time, she was doing well overall. She was having difficulties sleeping secondary to developing nocturnal diaphoresis (hot flashes). She denied any nausea or vomiting. Bowels had improved with the change in her Metformin to the ER formulation. She denied any breast concerns. Exam was grossly unremarkable.  WBC 12,500 (ANC 11,400).  Platelets 245,000.  Blood sugar was elevated 221.  AST 59, ALT 110, ALP 52, and total bilirubin 1.1.  Magnesium was normal 1.7.  Urine pregnancy test was negative.  She received cycle #5 TCHP with Udencya support.  Taxotere was decreased secondary to elevated LFTs.  She was started on Effexor XR 37.5 mg mg a day.  She received her influenza vaccine on 04/05/2018.  She met with Dr. Audelia Hives, plastic surgeon, on 04/05/2018.  Notes reviewed.  She was interested in bilateral mastectomies.  She would like immediate reconstruction with expanders and flex HD.  She was open to the suggestion of waiting to have the prophylactic mastectomy on the right until after she has completely healed up on the LEFT.  She needed a little more time to think about it.  She was to discuss further with Dr. Tollie Pizza.  Surgery was tentatively planned for the beginning of 05/2018.  During the interim, patient has been doing well. She tolerated her last chemotherapy cycles well. Patient's bowels are stable. She states, "It is definitely the Metformin. When I stop it and start back, my stools get yucky again". Vasomotor symptoms have improved on the low dose venlafaxine therapy. Patient denies that she has experienced any B symptoms. She denies any interval  infections.   Patient advises that she maintains an adequate appetite. She is eating well. Weight today is 183 lb 3.2 oz (83.1 kg), which compared to her last visit to the clinic, represents a 2 pound increase.  Patient denies pain in the clinic today.  Patient plans to go to a wedding in [place]. She is asking if she will be allowed to fly that soon following her surgery.    Past Medical History:  Diagnosis Date  . Abnormal breast biopsy    [facility], [name]  . Family history of breast cancer   . Family history of ovarian cancer   . Fibroadenoma of breast, left 2000, 2016  . Genetic screening 08/26/2013   BRCA/BART negative/[facility], CHEK2 POS 2017  . Increased risk of breast cancer 2017   IBIS=47%  . Left breast lump 06/18/2017   Fluid/drained [name]  . Migraine   . Monoallelic mutation of CHEK2 gene in female patient 2017   increased risk of breast and colon cancer    Past Surgical History:  Procedure Laterality Date  . BREAST BIOPSY Bilateral 09/08/14   neg  . BREAST BIOPSY Left 03/16/2015   Procedure: BREAST BIOPSY WITH NEEDLE LOCALIZATION;  Surgeon: [name], MD;  Location: [facility] ORS;  Service: General;  Laterality: Left;  . BREAST CYST ASPIRATION Left 06/18/2017   cyst aspiration  . BREAST EXCISIONAL BIOPSY Left 09/2014  . BREAST EXCISIONAL BIOPSY Right    age 27's  . BREAST SURGERY Right March 2000   benign fibroadenoma  . BREAST SURGERY Left  08/08/13   excision  . BREAST SURGERY Left 09/23/14   excision  . PORTACATH PLACEMENT Right 12/17/2017   Procedure: INSERTION PORT-A-CATH;  Surgeon: [name], MD;  Location: [facility] ORS;  Service: General;  Laterality: Right;    Family History  Problem Relation Age of Onset  . Prostate cancer Father 56  . Breast cancer Paternal Grandmother 24  . Breast cancer Maternal Aunt 60  . Ovarian cancer Maternal Aunt 70  . Breast cancer Paternal Aunt 63  . Pancreatic cancer Maternal Aunt 75    Social History:   reports that she has never smoked. She has never used smokeless tobacco. She reports that she drinks alcohol. She reports that she does not use drugs.  She does not smoke. She "occasionally" drinks alcohol. 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 Patient denies known exposures to radiation on toxins. Her husband's name is [name]. She has 2 daughters, [name] and [name] (ages 44 1/2 and 81).  Her husband's name is [name].  The patient is accompanied by her husband today.  Allergies:  Allergies  Allergen Reactions  . Tape     Minor skin irritation     Current Medications: Current Outpatient Medications  Medication Sig Dispense Refill  . Blood Glucose Monitoring Suppl (FIFTY50 GLUCOSE METER 2.0) w/Device KIT Use as directed    . dexamethasone (DECADRON) 4 MG tablet Take 2 tablets (8 mg total) by mouth 2 (two) times daily. Start the day before Taxotere. Take once the day after, then 2 times a day x 2d. 30 tablet 2  . glucose blood (PRECISION QID TEST) test strip Use 1 each (1 strip total) once daily Use as instructed.    . lidocaine-prilocaine (EMLA) cream Apply to affected area once 30 g 3  . loperamide (IMODIUM) 2 MG capsule Take by mouth.    . loratadine (CLARITIN) 10 MG tablet Take by mouth.    . metFORMIN (GLUCOPHAGE) 500 MG tablet Take 500 mg by mouth 2 (two) times daily with a meal.     . naproxen sodium (ALEVE) 220 MG tablet Take 440 mg by mouth daily as needed (pain).    . nystatin (MYCOSTATIN) 100000 UNIT/ML suspension Swish and swallow 5 cc QID x 5 days, then PRN. 240 mL 1  . omeprazole (PRILOSEC) 10 MG capsule Take 10 mg by mouth daily.    . ondansetron (ZOFRAN) 8 MG tablet Take 1 tablet (8 mg total) by mouth every 8 (eight) hours as needed (Nausea or vomiting). 30 tablet 3  . prochlorperazine (COMPAZINE) 10 MG tablet Take 1 tablet (10 mg total) by mouth every 6 (six) hours as needed (Nausea or vomiting). 30 tablet 1  . venlafaxine XR (EFFEXOR-XR) 37.5 MG 24 hr  capsule Take 1 capsule (37.5 mg total) by mouth daily with breakfast. 30 capsule 0   No current facility-administered medications for this visit.    Facility-Administered Medications Ordered in Other Visits  Medication Dose Route Frequency Provider Last Rate Last Dose  . heparin lock flush 100 unit/mL  500 Units Intravenous Once ,  C, MD      . sodium chloride flush (NS) 0.9 % injection 10 mL  10 mL Intravenous PRN Lequita Asal, MD   10 mL at 04/17/18 0844    Review of Systems  Constitutional: Positive for diaphoresis (nocturnal; improved). Negative for fever, malaise/fatigue and weight loss (up 2 pounds).       Feels "fine".  Notes "same ole, same ole".  HENT: Negative.   Eyes: Negative.   Respiratory: Negative for cough, hemoptysis, sputum production and shortness of breath.   Cardiovascular: Negative for chest pain, palpitations, orthopnea, leg swelling and PND.  Gastrointestinal: Negative for abdominal pain, blood in stool, constipation, diarrhea, melena, nausea and vomiting.       Bowels stable; loose stools 2/2 metformin  Genitourinary: Negative for dysuria, frequency, hematuria and urgency.  Musculoskeletal: Negative for back pain, falls, joint pain and myalgias.  Skin: Negative for itching and rash.       PowerPort right chest wall.  Neurological: Negative for dizziness, tremors, weakness and headaches.  Endo/Heme/Allergies: Does not bruise/bleed easily.       PMH (+) for diabetes - on metformin. (+) Vasomotor symptoms; improved.   Psychiatric/Behavioral: Negative for depression, memory loss and suicidal ideas. The patient is not nervous/anxious and does not have insomnia (improved).   All other systems reviewed and are negative.  Performance status (ECOG): 1 - Symptomatic but completely ambulatory  Vital Signs BP 123/85 (BP Location: Left Arm, Patient Position: Sitting)   Pulse 98   Temp (!) 96.7 F (35.9 C) (Tympanic)   Resp 18   Wt 183 lb 3.2 oz  (83.1 kg)   BMI 28.69 kg/m   Physical Exam  Constitutional: She is oriented to person, place, and time and well-developed, well-nourished, and in no distress.  HENT:  Head: Normocephalic and atraumatic. Hair is abnormal (chemotherapy induced alopecia; wearing a  head wrap).  Mouth/Throat: Oropharynx is clear and moist and mucous membranes are normal.  Eyes: Pupils are equal, round, and reactive to light. EOM are normal. No scleral icterus.  Neck: Normal range of motion. Neck supple. No tracheal deviation present. No thyromegaly present.  Cardiovascular: Normal rate, regular rhythm, normal heart sounds and intact distal pulses. Exam reveals no gallop and no friction rub.  No murmur heard. Pulmonary/Chest: Effort normal and breath sounds normal. No respiratory distress. She has no wheezes. She has no rales.  PowerPort to right chest wall.  Abdominal: Soft. Bowel sounds are normal. She exhibits no distension. There is no tenderness.  Musculoskeletal: Normal range of motion. She exhibits no edema or tenderness.  Lymphadenopathy:    She has no cervical adenopathy.    She has no axillary adenopathy.       Right: No inguinal and no supraclavicular adenopathy present.       Left: No inguinal and no supraclavicular adenopathy present.  Neurological: She is alert and oriented to person, place, and time.  Skin: Skin is warm and dry. No rash noted. No erythema.  Psychiatric: Mood, affect and judgment normal.  Nursing note and vitals reviewed.   Infusion on 04/17/2018  Component Date Value Ref Range Status  . Magnesium 04/17/2018 1.5* 1.7 - 2.4 mg/dL Final   Performed at [facility], [address] [place], [place] [address]  . Sodium 04/17/2018 136  135 - 145 mmol/L Final  . Potassium 04/17/2018 3.6  3.5 - 5.1 mmol/L Final  . Chloride 04/17/2018 104  98 - 111 mmol/L Final  . CO2 04/17/2018 21* 22 - 32 mmol/L Final  . Glucose, Bld 04/17/2018 218* 70 - 99 mg/dL Final  . BUN  04/17/2018 12  6 - 20 mg/dL Final  . Creatinine, Ser 04/17/2018 0.65  0.44 - 1.00 mg/dL Final  . Calcium 04/17/2018 9.1  8.9 - 10.3 mg/dL Final  . Total Protein 04/17/2018 6.8  6.5 - 8.1 g/dL Final  . Albumin 04/17/2018 4.1  3.5 -  5.0 g/dL Final  . AST 04/17/2018 50* 15 - 41 U/L Final  . ALT 04/17/2018 53* 0 - 44 U/L Final  . Alkaline Phosphatase 04/17/2018 56  38 - 126 U/L Final  . Total Bilirubin 04/17/2018 0.6  0.3 - 1.2 mg/dL Final  . GFR calc non Af Amer 04/17/2018 >60  >60 mL/min Final  . GFR calc Af Amer 04/17/2018 >60  >60 mL/min Final   Comment: (NOTE) The eGFR has been calculated using the CKD EPI equation. This calculation has not been validated in all clinical situations. eGFR's persistently <60 mL/min signify possible Chronic Kidney Disease.   Georgiann Hahn gap 04/17/2018 11  5 - 15 Final   Performed at [facility], [address], [place], [place] [address]  . WBC 04/17/2018 12.0* 4.0 - 10.5 K/uL Final  . RBC 04/17/2018 3.37* 3.87 - 5.11 MIL/uL Final  . Hemoglobin 04/17/2018 10.6* 12.0 - 15.0 g/dL Final  . HCT 04/17/2018 31.7* 36.0 - 46.0 % Final  . MCV 04/17/2018 94.1  80.0 - 100.0 fL Final  . MCH 04/17/2018 31.5  26.0 - 34.0 pg Final  . MCHC 04/17/2018 33.4  30.0 - 36.0 g/dL Final  . RDW 04/17/2018 16.4* 11.5 - 15.5 % Final  . Platelets 04/17/2018 187  150 - 400 K/uL Final  . nRBC 04/17/2018 0.0  0.0 - 0.2 % Final  . Neutrophils Relative % 04/17/2018 92  % Final  . Neutro Abs 04/17/2018 11.0* 1.7 - 7.7 K/uL Final  . Lymphocytes Relative 04/17/2018 4  % Final  . Lymphs Abs 04/17/2018 0.5* 0.7 - 4.0 K/uL Final  . Monocytes Relative 04/17/2018 3  % Final  . Monocytes Absolute 04/17/2018 0.4  0.1 - 1.0 K/uL Final  . Eosinophils Relative 04/17/2018 0  % Final  . Eosinophils Absolute 04/17/2018 0.0  0.0 - 0.5 K/uL Final  . Basophils Relative 04/17/2018 0  % Final  . Basophils Absolute 04/17/2018 0.0  0.0 - 0.1 K/uL Final  . Immature Granulocytes 04/17/2018 1   % Final  . Abs Immature Granulocytes 04/17/2018 0.09* 0.00 - 0.07 K/uL Final   Performed at [facility], [address] [place], [place] [address]    Assessment:  Denise [name] is a 42 y.o. female with multi-focal stage IB Her2/neu + left breast cancer s/p index breast mass and axillary node biopsy on 12/06/2017.  Pathology revealed grade II invasive ductal carcinoma with calcifications at the 11 o'clock position.  There was metastatic carcinoma in 1 of 1 lymph nodes.  Tumor was ER + (100%), PR + (100%), and Ki67 5%.  Her2/neu was 3+ by IHC (heterogeneous) and FISH +.  Diagnostic left mammogram and ultrasound on 12/06/2017 revealed a 1.7 x 1.3 x 1.3 cm irregular hypoechoic mass with internal calcifications at the 11 o'clock position 2 cm from the nipple with internal calcifications. There was a similar appearing 0.9 x 0.8 x 0.7 cm mass at the 11 o'clock position 4 cm from the nipple (2.4 cm from the index lesion).  There were suspicious, segmental calcifications originating from the index lesion and extending 5 cm posteriorly.  There was a 0.6 cm morphologically abnormal left axillary lymph node.  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 The adjacent and more superiorly located 11 o'clock mass (7 x 12 mm) seen on recent ultrasound, 4 cm from the  nipple on ultrasound, was also identified. There were 2 probable satellite lesions (7 mm, medial lesion; posterior and lateral lesion). The total span of malignancy was 3 x 3.1 x 4.5 cm.  There were calcifications extending up to 5 cm posterior to the biopsied malignancy which were highly suspicious on mammography but not appreciated.  There was a mass in the lower outer left breast measured 5 mm, representing a change.  The known metastatic node in the left axilla was identified. A node along the posterolateral margin of the  pectoralis minor (cortex 5 mm) was nonspecific but at least somewhat suspicious.  Chest, abdomen, and pelvic CT on 12/19/2017 revealed a solitary enlarged biopsy-proven metastatic left axillary lymph node.  There were no additional findings suspicious for metastatic disease in the chest, abdomen or pelvis.  There was a nonspecific tiny sclerotic upper right sacral lesion, more likely a benign bone island. Bone scintigraphy correlation versus follow-up CT could be considered.  Bone scan on 01/11/2018 was negative for metastatic pattern. No areas of focal tracer uptake. Area of concern in sacrum on CT likely represents benign bone island.   [facility] genetic testing on 08/26/2013 was negative for BRCA1 and 2. 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  She has a family history significant for breast, ovarian, pancreatic, and prostate cancer.  She is s/p 4 cycles of TCHP chemotherapy (12/21/2017 - 03/07/2018) with Margarette Canada support. Cycle #3 was held due to elevated LFTs on 02/04/2018. She is tolerating treatment well.  Echo on 12/20/2017 revealed an EF of 60-65%.  Echo on 03/27/2018 revealed an EF of 60-65%.  She has a history of elevated LFTs.  Hepatitis B and C serologies were negative on 02/04/2018.  RUQ ultrasound on 02/06/2018 revealed a small anterior gallbladder wall polyp. There was no evidence of cholelithiasis or cholecystis. CBD measured normal at 2 mm, with no evidence of choledocholithiasis. There was normal direction of blood flow towards the liver noted.  Symptomatically, she is doing well. She denies any major side effects associated with her last chemotherapy cycle. No B symptoms or interval infections. Vasomotor symptoms and sleep has improved with addition of low dose venlafaxine. Exam is grossly unremarkable.  WBC 12,000 (ANC 11,000).  Hemoglobin 10.6, hematocrit 31.7, and platelets 187,000.  Glucose elevated at 218.  AST 50, ALT 53, ALP 56, and total bilirubin  0.6.  Magnesium low at 1.5 mg/dL.  Plan: 1. Labs today:  CBC with diff, CMP, Mg. 2. Multifocal stage IB left breast cancer Doing well overall. Tolerating treatments with minimal side effects. Anticipate interval echocardiogram in 06/2018. Review surgical plans following neoadjuvant chemotherapy course.  Surgery being considered in early 05/2018. Asking if she will be ready to fly in 06/2018 - encouraged her to discuss with [name].  Discuss switch to Herceptin + Perjeta alone starting in 3 weeks.  Plan 1 year of therapy.  Consider Kadcyla if residual disease at time of surgery. Labs reviewed. Blood counts stable and adequate enough for treatment. Urine HCG (-). Will proceed with cycle #6 TCHP (final cycle) with Udenyca support. Discuss symptom management.  Patient has antiemetics and pain medications at home to use on a PRN basis. Patient  advising that the  prescribed interventions are adequate at this point. Continue all medications as previously prescribed.  3. Transaminitis  Improved. AST 50, ALT 53, ALP 56, and total bilirubin 0.6. 4. Vasomotor symptoms  Improved on low dose venlafaxine.   Symptoms persist, however patient not interested in up  titration of venlafaxine dose at this time.   Monitor for improvement.  5. Hypomagnesemia  Discuss low magnesium. Magnesium 1.5 today.   Will replace with 2 grams intravenous magnesium.  Continue routine monitoring.  6. Hyperglycemia  Blood sugars under better control.   Elevated at 218 mg/dL today due to pre-treatment steroids.   Continues on metformin ER. 7. Diarrhea  Improved overall. Controlled with OTC interventions.  Attributed to Metformin dose.   Encouraged ORS to prevent dehydration and electrolyte derangements.  8. RTC on 05/09/2018 for MD assessment, labs (CBC with diff, CMP, Mg), and continuation of Herceptin + Perjeta    Honor Loh, NP  04/17/18, 9:15 AM   I saw and evaluated the patient, participating  in the key portions of the service and reviewing pertinent diagnostic studies and records.  I reviewed the nurse practitioner's note and agree with the findings and the plan.  The assessment and plan were discussed with the patient.  Multiple questions were asked by the patient and answered.    Nolon Stalls, MD 04/17/2018,9:15 AM

## 2018-04-17 NOTE — Patient Instructions (Signed)
Carboplatin injection What is this medicine? CARBOPLATIN (KAR boe pla tin) is a chemotherapy drug. It targets fast dividing cells, like cancer cells, and causes these cells to die. This medicine is used to treat ovarian cancer and many other cancers. This medicine may be used for other purposes; ask your health care provider or pharmacist if you have questions. COMMON BRAND NAME(S): Paraplatin What should I tell my health care provider before I take this medicine? They need to know if you have any of these conditions: -blood disorders -hearing problems -kidney disease -recent or ongoing radiation therapy -an unusual or allergic reaction to carboplatin, cisplatin, other chemotherapy, other medicines, foods, dyes, or preservatives -pregnant or trying to get pregnant -breast-feeding How should I use this medicine? This drug is usually given as an infusion into a vein. It is administered in a hospital or clinic by a specially trained health care professional. Talk to your pediatrician regarding the use of this medicine in children. Special care may be needed. Overdosage: If you think you have taken too much of this medicine contact a poison control center or emergency room at once. NOTE: This medicine is only for you. Do not share this medicine with others. What if I miss a dose? It is important not to miss a dose. Call your doctor or health care professional if you are unable to keep an appointment. What may interact with this medicine? -medicines for seizures -medicines to increase blood counts like filgrastim, pegfilgrastim, sargramostim -some antibiotics like amikacin, gentamicin, neomycin, streptomycin, tobramycin -vaccines Talk to your doctor or health care professional before taking any of these medicines: -acetaminophen -aspirin -ibuprofen -ketoprofen -naproxen This list may not describe all possible interactions. Give your health care provider a list of all the medicines, herbs,  non-prescription drugs, or dietary supplements you use. Also tell them if you smoke, drink alcohol, or use illegal drugs. Some items may interact with your medicine. What should I watch for while using this medicine? Your condition will be monitored carefully while you are receiving this medicine. You will need important blood work done while you are taking this medicine. This drug may make you feel generally unwell. This is not uncommon, as chemotherapy can affect healthy cells as well as cancer cells. Report any side effects. Continue your course of treatment even though you feel ill unless your doctor tells you to stop. In some cases, you may be given additional medicines to help with side effects. Follow all directions for their use. Call your doctor or health care professional for advice if you get a fever, chills or sore throat, or other symptoms of a cold or flu. Do not treat yourself. This drug decreases your body's ability to fight infections. Try to avoid being around people who are sick. This medicine may increase your risk to bruise or bleed. Call your doctor or health care professional if you notice any unusual bleeding. Be careful brushing and flossing your teeth or using a toothpick because you may get an infection or bleed more easily. If you have any dental work done, tell your dentist you are receiving this medicine. Avoid taking products that contain aspirin, acetaminophen, ibuprofen, naproxen, or ketoprofen unless instructed by your doctor. These medicines may hide a fever. Do not become pregnant while taking this medicine. Women should inform their doctor if they wish to become pregnant or think they might be pregnant. There is a potential for serious side effects to an unborn child. Talk to your health care professional or  pharmacist for more information. Do not breast-feed an infant while taking this medicine. What side effects may I notice from receiving this medicine? Side effects  that you should report to your doctor or health care professional as soon as possible: -allergic reactions like skin rash, itching or hives, swelling of the face, lips, or tongue -signs of infection - fever or chills, cough, sore throat, pain or difficulty passing urine -signs of decreased platelets or bleeding - bruising, pinpoint red spots on the skin, black, tarry stools, nosebleeds -signs of decreased red blood cells - unusually weak or tired, fainting spells, lightheadedness -breathing problems -changes in hearing -changes in vision -chest pain -high blood pressure -low blood counts - This drug may decrease the number of white blood cells, red blood cells and platelets. You may be at increased risk for infections and bleeding. -nausea and vomiting -pain, swelling, redness or irritation at the injection site -pain, tingling, numbness in the hands or feet -problems with balance, talking, walking -trouble passing urine or change in the amount of urine Side effects that usually do not require medical attention (report to your doctor or health care professional if they continue or are bothersome): -hair loss -loss of appetite -metallic taste in the mouth or changes in taste This list may not describe all possible side effects. Call your doctor for medical advice about side effects. You may report side effects to FDA at 1-800-FDA-1088. Where should I keep my medicine? This drug is given in a hospital or clinic and will not be stored at home. NOTE: This sheet is a summary. It may not cover all possible information. If you have questions about this medicine, talk to your doctor, pharmacist, or health care provider.  2018 Elsevier/Gold Standard (2007-09-10 14:38:05) Docetaxel injection What is this medicine? DOCETAXEL (doe se TAX el) is a chemotherapy drug. It targets fast dividing cells, like cancer cells, and causes these cells to die. This medicine is used to treat many types of cancers  like breast cancer, certain stomach cancers, head and neck cancer, lung cancer, and prostate cancer. This medicine may be used for other purposes; ask your health care provider or pharmacist if you have questions. COMMON BRAND NAME(S): Docefrez, Taxotere What should I tell my health care provider before I take this medicine? They need to know if you have any of these conditions: -infection (especially a virus infection such as chickenpox, cold sores, or herpes) -liver disease -low blood counts, like low white cell, platelet, or red cell counts -an unusual or allergic reaction to docetaxel, polysorbate 80, other chemotherapy agents, other medicines, foods, dyes, or preservatives -pregnant or trying to get pregnant -breast-feeding How should I use this medicine? This drug is given as an infusion into a vein. It is administered in a hospital or clinic by a specially trained health care professional. Talk to your pediatrician regarding the use of this medicine in children. Special care may be needed. Overdosage: If you think you have taken too much of this medicine contact a poison control center or emergency room at once. NOTE: This medicine is only for you. Do not share this medicine with others. What if I miss a dose? It is important not to miss your dose. Call your doctor or health care professional if you are unable to keep an appointment. What may interact with this medicine? -cyclosporine -erythromycin -ketoconazole -medicines to increase blood counts like filgrastim, pegfilgrastim, sargramostim -vaccines Talk to your doctor or health care professional before taking any of these  medicines: -acetaminophen -aspirin -ibuprofen -ketoprofen -naproxen This list may not describe all possible interactions. Give your health care provider a list of all the medicines, herbs, non-prescription drugs, or dietary supplements you use. Also tell them if you smoke, drink alcohol, or use illegal drugs.  Some items may interact with your medicine. What should I watch for while using this medicine? Your condition will be monitored carefully while you are receiving this medicine. You will need important blood work done while you are taking this medicine. This drug may make you feel generally unwell. This is not uncommon, as chemotherapy can affect healthy cells as well as cancer cells. Report any side effects. Continue your course of treatment even though you feel ill unless your doctor tells you to stop. In some cases, you may be given additional medicines to help with side effects. Follow all directions for their use. Call your doctor or health care professional for advice if you get a fever, chills or sore throat, or other symptoms of a cold or flu. Do not treat yourself. This drug decreases your body's ability to fight infections. Try to avoid being around people who are sick. This medicine may increase your risk to bruise or bleed. Call your doctor or health care professional if you notice any unusual bleeding. This medicine may contain alcohol in the product. You may get drowsy or dizzy. Do not drive, use machinery, or do anything that needs mental alertness until you know how this medicine affects you. Do not stand or sit up quickly, especially if you are an older patient. This reduces the risk of dizzy or fainting spells. Avoid alcoholic drinks. Do not become pregnant while taking this medicine. Women should inform their doctor if they wish to become pregnant or think they might be pregnant. There is a potential for serious side effects to an unborn child. Talk to your health care professional or pharmacist for more information. Do not breast-feed an infant while taking this medicine. What side effects may I notice from receiving this medicine? Side effects that you should report to your doctor or health care professional as soon as possible: -allergic reactions like skin rash, itching or hives,  swelling of the face, lips, or tongue -low blood counts - This drug may decrease the number of white blood cells, red blood cells and platelets. You may be at increased risk for infections and bleeding. -signs of infection - fever or chills, cough, sore throat, pain or difficulty passing urine -signs of decreased platelets or bleeding - bruising, pinpoint red spots on the skin, black, tarry stools, nosebleeds -signs of decreased red blood cells - unusually weak or tired, fainting spells, lightheadedness -breathing problems -fast or irregular heartbeat -low blood pressure -mouth sores -nausea and vomiting -pain, swelling, redness or irritation at the injection site -pain, tingling, numbness in the hands or feet -swelling of the ankle, feet, hands -weight gain Side effects that usually do not require medical attention (report to your doctor or health care professional if they continue or are bothersome): -bone pain -complete hair loss including hair on your head, underarms, pubic hair, eyebrows, and eyelashes -diarrhea -excessive tearing -changes in the color of fingernails -loosening of the fingernails -nausea -muscle pain -red flush to skin -sweating -weak or tired This list may not describe all possible side effects. Call your doctor for medical advice about side effects. You may report side effects to FDA at 1-800-FDA-1088. Where should I keep my medicine? This drug is given in a hospital  or clinic and will not be stored at home. NOTE: This sheet is a summary. It may not cover all possible information. If you have questions about this medicine, talk to your doctor, pharmacist, or health care provider.  2018 Elsevier/Gold Standard (2015-07-08 12:32:56) Trastuzumab injection for infusion What is this medicine? TRASTUZUMAB (tras TOO zoo mab) is a monoclonal antibody. It is used to treat breast cancer and stomach cancer. This medicine may be used for other purposes; ask your health  care provider or pharmacist if you have questions. COMMON BRAND NAME(S): Herceptin What should I tell my health care provider before I take this medicine? They need to know if you have any of these conditions: -heart disease -heart failure -lung or breathing disease, like asthma -an unusual or allergic reaction to trastuzumab, benzyl alcohol, or other medications, foods, dyes, or preservatives -pregnant or trying to get pregnant -breast-feeding How should I use this medicine? This drug is given as an infusion into a vein. It is administered in a hospital or clinic by a specially trained health care professional. Talk to your pediatrician regarding the use of this medicine in children. This medicine is not approved for use in children. Overdosage: If you think you have taken too much of this medicine contact a poison control center or emergency room at once. NOTE: This medicine is only for you. Do not share this medicine with others. What if I miss a dose? It is important not to miss a dose. Call your doctor or health care professional if you are unable to keep an appointment. What may interact with this medicine? This medicine may interact with the following medications: -certain types of chemotherapy, such as daunorubicin, doxorubicin, epirubicin, and idarubicin This list may not describe all possible interactions. Give your health care provider a list of all the medicines, herbs, non-prescription drugs, or dietary supplements you use. Also tell them if you smoke, drink alcohol, or use illegal drugs. Some items may interact with your medicine. What should I watch for while using this medicine? Visit your doctor for checks on your progress. Report any side effects. Continue your course of treatment even though you feel ill unless your doctor tells you to stop. Call your doctor or health care professional for advice if you get a fever, chills or sore throat, or other symptoms of a cold or flu.  Do not treat yourself. Try to avoid being around people who are sick. You may experience fever, chills and shaking during your first infusion. These effects are usually mild and can be treated with other medicines. Report any side effects during the infusion to your health care professional. Fever and chills usually do not happen with later infusions. Do not become pregnant while taking this medicine or for 7 months after stopping it. Women should inform their doctor if they wish to become pregnant or think they might be pregnant. Women of child-bearing potential will need to have a negative pregnancy test before starting this medicine. There is a potential for serious side effects to an unborn child. Talk to your health care professional or pharmacist for more information. Do not breast-feed an infant while taking this medicine or for 7 months after stopping it. Women must use effective birth control with this medicine. What side effects may I notice from receiving this medicine? Side effects that you should report to your doctor or health care professional as soon as possible: -allergic reactions like skin rash, itching or hives, swelling of the face, lips, or  tongue -chest pain or palpitations -cough -dizziness -feeling faint or lightheaded, falls -fever -general ill feeling or flu-like symptoms -signs of worsening heart failure like breathing problems; swelling in your legs and feet -unusually weak or tired Side effects that usually do not require medical attention (report to your doctor or health care professional if they continue or are bothersome): -bone pain -changes in taste -diarrhea -joint pain -nausea/vomiting -weight loss This list may not describe all possible side effects. Call your doctor for medical advice about side effects. You may report side effects to FDA at 1-800-FDA-1088. Where should I keep my medicine? This drug is given in a hospital or clinic and will not be stored  at home. NOTE: This sheet is a summary. It may not cover all possible information. If you have questions about this medicine, talk to your doctor, pharmacist, or health care provider.  2018 Elsevier/Gold Standard (2016-05-30 14:37:52) Pertuzumab injection What is this medicine? PERTUZUMAB (per TOOZ ue mab) is a monoclonal antibody. It is used to treat breast cancer. This medicine may be used for other purposes; ask your health care provider or pharmacist if you have questions. COMMON BRAND NAME(S): PERJETA What should I tell my health care provider before I take this medicine? They need to know if you have any of these conditions: -heart disease -heart failure -high blood pressure -history of irregular heart beat -recent or ongoing radiation therapy -an unusual or allergic reaction to pertuzumab, other medicines, foods, dyes, or preservatives -pregnant or trying to get pregnant -breast-feeding How should I use this medicine? This medicine is for infusion into a vein. It is given by a health care professional in a hospital or clinic setting. Talk to your pediatrician regarding the use of this medicine in children. Special care may be needed. Overdosage: If you think you have taken too much of this medicine contact a poison control center or emergency room at once. NOTE: This medicine is only for you. Do not share this medicine with others. What if I miss a dose? It is important not to miss your dose. Call your doctor or health care professional if you are unable to keep an appointment. What may interact with this medicine? Interactions are not expected. Give your health care provider a list of all the medicines, herbs, non-prescription drugs, or dietary supplements you use. Also tell them if you smoke, drink alcohol, or use illegal drugs. Some items may interact with your medicine. This list may not describe all possible interactions. Give your health care provider a list of all the  medicines, herbs, non-prescription drugs, or dietary supplements you use. Also tell them if you smoke, drink alcohol, or use illegal drugs. Some items may interact with your medicine. What should I watch for while using this medicine? Your condition will be monitored carefully while you are receiving this medicine. Report any side effects. Continue your course of treatment even though you feel ill unless your doctor tells you to stop. Do not become pregnant while taking this medicine or for 7 months after stopping it. Women should inform their doctor if they wish to become pregnant or think they might be pregnant. Women of child-bearing potential will need to have a negative pregnancy test before starting this medicine. There is a potential for serious side effects to an unborn child. Talk to your health care professional or pharmacist for more information. Do not breast-feed an infant while taking this medicine or for 7 months after stopping it. Women must use effective  birth control with this medicine. Call your doctor or health care professional for advice if you get a fever, chills or sore throat, or other symptoms of a cold or flu. Do not treat yourself. Try to avoid being around people who are sick. You may experience fever, chills, and headache during the infusion. Report any side effects during the infusion to your health care professional. What side effects may I notice from receiving this medicine? Side effects that you should report to your doctor or health care professional as soon as possible: -breathing problems -chest pain or palpitations -dizziness -feeling faint or lightheaded -fever or chills -skin rash, itching or hives -sore throat -swelling of the face, lips, or tongue -swelling of the legs or ankles -unusually weak or tired Side effects that usually do not require medical attention (report to your doctor or health care professional if they continue or are  bothersome): -diarrhea -hair loss -nausea, vomiting -tiredness This list may not describe all possible side effects. Call your doctor for medical advice about side effects. You may report side effects to FDA at 1-800-FDA-1088. Where should I keep my medicine? This drug is given in a hospital or clinic and will not be stored at home. NOTE: This sheet is a summary. It may not cover all possible information. If you have questions about this medicine, talk to your doctor, pharmacist, or health care provider.  2018 Elsevier/Gold Standard (2015-07-08 12:08:50)

## 2018-04-17 NOTE — Progress Notes (Signed)
Patient offers no complaints today.  Does want to discuss some upcoming travel plans.

## 2018-04-18 ENCOUNTER — Inpatient Hospital Stay: Payer: BC Managed Care – PPO

## 2018-04-18 ENCOUNTER — Other Ambulatory Visit: Payer: BC Managed Care – PPO

## 2018-04-18 ENCOUNTER — Ambulatory Visit: Payer: BC Managed Care – PPO

## 2018-04-18 ENCOUNTER — Ambulatory Visit: Payer: BC Managed Care – PPO | Admitting: Hematology and Oncology

## 2018-04-18 DIAGNOSIS — C50412 Malignant neoplasm of upper-outer quadrant of left female breast: Secondary | ICD-10-CM | POA: Diagnosis not present

## 2018-04-18 DIAGNOSIS — B370 Candidal stomatitis: Secondary | ICD-10-CM

## 2018-04-18 DIAGNOSIS — Z170 Estrogen receptor positive status [ER+]: Secondary | ICD-10-CM

## 2018-04-18 MED ORDER — PEGFILGRASTIM-CBQV 6 MG/0.6ML ~~LOC~~ SOSY
6.0000 mg | PREFILLED_SYRINGE | Freq: Once | SUBCUTANEOUS | Status: AC
  Administered 2018-04-18: 6 mg via SUBCUTANEOUS

## 2018-04-19 ENCOUNTER — Ambulatory Visit: Payer: BC Managed Care – PPO

## 2018-04-23 ENCOUNTER — Encounter: Payer: Self-pay | Admitting: Urgent Care

## 2018-04-30 ENCOUNTER — Encounter: Payer: Self-pay | Admitting: Urgent Care

## 2018-04-30 ENCOUNTER — Other Ambulatory Visit: Payer: Self-pay | Admitting: General Surgery

## 2018-04-30 ENCOUNTER — Telehealth: Payer: Self-pay

## 2018-04-30 ENCOUNTER — Other Ambulatory Visit: Payer: Self-pay

## 2018-04-30 ENCOUNTER — Other Ambulatory Visit: Payer: Self-pay | Admitting: Urgent Care

## 2018-04-30 DIAGNOSIS — C50212 Malignant neoplasm of upper-inner quadrant of left female breast: Secondary | ICD-10-CM

## 2018-04-30 DIAGNOSIS — Z170 Estrogen receptor positive status [ER+]: Principal | ICD-10-CM

## 2018-04-30 MED ORDER — VENLAFAXINE HCL ER 37.5 MG PO CP24: 37.5000 mg | ORAL_CAPSULE | Freq: Every day | ORAL | 2 refills | Status: DC

## 2018-04-30 NOTE — Telephone Encounter (Signed)
Call to patient to discuss surgery. The patient is scheduled for surgery at [facility] with Dr Bary Castilla on 05/20/18. She will arrive that morning and report to the Radiology desk at 7:45 am. She will make use of EMLA cream and apply to her left areola one hour prior to leaving and cover with plastic wrap. She will be seen in office for a short pre op visit on 05/09/18 at 4:15 pm. The patient is aware of dates, times, and instructions.

## 2018-05-03 ENCOUNTER — Encounter: Payer: Self-pay | Admitting: Urgent Care

## 2018-05-07 ENCOUNTER — Other Ambulatory Visit: Payer: Self-pay | Admitting: Hematology and Oncology

## 2018-05-07 NOTE — Progress Notes (Signed)
DISCONTINUE ON PATHWAY REGIMEN - Breast     A cycle is every 21 days:     Pertuzumab      Pertuzumab      Trastuzumab      Trastuzumab      Carboplatin      Docetaxel   **Always confirm dose/schedule in your pharmacy ordering system**  REASON: Continuation Of Treatment PRIOR TREATMENT: [id number]: Docetaxel + Carboplatin + Trastuzumab + Pertuzumab (TCHP) q21 Days x 6 Cycles TREATMENT RESPONSE: Unable to Evaluate    Patient Characteristics: Post-Neoadjuvant Therapy and Resection, HER2 Positive, ER Positive Therapeutic Status: Post-Neoadjuvant Therapy and Resection ER Status: Positive (+) HER2 Status: Positive (+) PR Status: Positive (+)

## 2018-05-07 NOTE — Progress Notes (Signed)
ON PATHWAY REGIMEN - Breast  No Change  Continue With Treatment as Ordered.     A cycle is every 21 days:     Pertuzumab      Pertuzumab      Trastuzumab      Trastuzumab      Carboplatin      Docetaxel   **Always confirm dose/schedule in your pharmacy ordering system**  Patient Characteristics: Preoperative or Nonsurgical Candidate (Clinical Staging), Neoadjuvant Therapy followed by Surgery, Invasive Disease, Chemotherapy, HER2 Positive, ER Positive Therapeutic Status: Preoperative or Nonsurgical Candidate (Clinical Staging) AJCC M Category: cM0 AJCC Grade: GX Breast Surgical Plan: Neoadjuvant Therapy followed by Surgery ER Status: Positive (+) AJCC 8 Stage Grouping: Unknown HER2 Status: Positive (+) AJCC T Category: cT1c AJCC N Category: cN1 PR Status: Positive (+) Intent of Therapy: Curative Intent, Discussed with Patient 

## 2018-05-08 ENCOUNTER — Encounter: Payer: Self-pay | Admitting: Hematology and Oncology

## 2018-05-09 ENCOUNTER — Inpatient Hospital Stay: Payer: BC Managed Care – PPO

## 2018-05-09 ENCOUNTER — Encounter: Payer: Self-pay | Admitting: General Surgery

## 2018-05-09 ENCOUNTER — Other Ambulatory Visit: Payer: Self-pay

## 2018-05-09 ENCOUNTER — Inpatient Hospital Stay: Payer: BC Managed Care – PPO | Attending: Hematology and Oncology

## 2018-05-09 ENCOUNTER — Ambulatory Visit: Payer: Self-pay

## 2018-05-09 ENCOUNTER — Inpatient Hospital Stay (HOSPITAL_BASED_OUTPATIENT_CLINIC_OR_DEPARTMENT_OTHER): Payer: BC Managed Care – PPO | Admitting: Hematology and Oncology

## 2018-05-09 ENCOUNTER — Encounter: Payer: Self-pay | Admitting: Hematology and Oncology

## 2018-05-09 ENCOUNTER — Ambulatory Visit (INDEPENDENT_AMBULATORY_CARE_PROVIDER_SITE_OTHER): Payer: BC Managed Care – PPO | Admitting: General Surgery

## 2018-05-09 ENCOUNTER — Other Ambulatory Visit: Payer: Self-pay | Admitting: Hematology and Oncology

## 2018-05-09 VITALS — BP 130/85 | HR 104 | Temp 98.2°F | Resp 18 | Wt 185.3 lb

## 2018-05-09 VITALS — BP 113/74 | HR 103 | Temp 96.0°F | Resp 18

## 2018-05-09 VITALS — BP 130/85 | HR 101 | Temp 97.7°F | Resp 13 | Ht 64.0 in | Wt 187.0 lb

## 2018-05-09 DIAGNOSIS — Z79899 Other long term (current) drug therapy: Secondary | ICD-10-CM

## 2018-05-09 DIAGNOSIS — Z170 Estrogen receptor positive status [ER+]: Principal | ICD-10-CM

## 2018-05-09 DIAGNOSIS — C773 Secondary and unspecified malignant neoplasm of axilla and upper limb lymph nodes: Secondary | ICD-10-CM

## 2018-05-09 DIAGNOSIS — Z5112 Encounter for antineoplastic immunotherapy: Secondary | ICD-10-CM

## 2018-05-09 DIAGNOSIS — J40 Bronchitis, not specified as acute or chronic: Secondary | ICD-10-CM

## 2018-05-09 DIAGNOSIS — C50412 Malignant neoplasm of upper-outer quadrant of left female breast: Secondary | ICD-10-CM | POA: Insufficient documentation

## 2018-05-09 DIAGNOSIS — C50212 Malignant neoplasm of upper-inner quadrant of left female breast: Secondary | ICD-10-CM

## 2018-05-09 DIAGNOSIS — N951 Menopausal and female climacteric states: Secondary | ICD-10-CM

## 2018-05-09 LAB — CBC WITH DIFFERENTIAL/PLATELET
Abs Immature Granulocytes: 0.02 10*3/uL (ref 0.00–0.07)
Basophils Absolute: 0 10*3/uL (ref 0.0–0.1)
Basophils Relative: 0 %
Eosinophils Absolute: 0 10*3/uL (ref 0.0–0.5)
Eosinophils Relative: 0 %
HCT: 32 % — ABNORMAL LOW (ref 36.0–46.0)
Hemoglobin: 10.3 g/dL — ABNORMAL LOW (ref 12.0–15.0)
Immature Granulocytes: 0 %
Lymphocytes Relative: 19 %
Lymphs Abs: 1.4 10*3/uL (ref 0.7–4.0)
MCH: 30.3 pg (ref 26.0–34.0)
MCHC: 32.2 g/dL (ref 30.0–36.0)
MCV: 94.1 fL (ref 80.0–100.0)
Monocytes Absolute: 0.7 10*3/uL (ref 0.1–1.0)
Monocytes Relative: 10 %
Neutro Abs: 5.3 10*3/uL (ref 1.7–7.7)
Neutrophils Relative %: 71 %
Platelets: 214 10*3/uL (ref 150–400)
RBC: 3.4 MIL/uL — ABNORMAL LOW (ref 3.87–5.11)
RDW: 15.7 % — ABNORMAL HIGH (ref 11.5–15.5)
WBC: 7.5 10*3/uL (ref 4.0–10.5)
nRBC: 0 % (ref 0.0–0.2)

## 2018-05-09 LAB — COMPREHENSIVE METABOLIC PANEL
ALT: 46 U/L — ABNORMAL HIGH (ref 0–44)
AST: 39 U/L (ref 15–41)
Albumin: 3.7 g/dL (ref 3.5–5.0)
Alkaline Phosphatase: 54 U/L (ref 38–126)
Anion gap: 6 (ref 5–15)
BUN: 11 mg/dL (ref 6–20)
CO2: 26 mmol/L (ref 22–32)
Calcium: 8.9 mg/dL (ref 8.9–10.3)
Chloride: 108 mmol/L (ref 98–111)
Creatinine, Ser: 0.5 mg/dL (ref 0.44–1.00)
GFR calc Af Amer: >60 mL/min (ref >60)
GFR calc non Af Amer: >60 mL/min (ref >60)
Glucose, Bld: 111 mg/dL — ABNORMAL HIGH (ref 70–99)
Potassium: 4 mmol/L (ref 3.5–5.1)
Sodium: 140 mmol/L (ref 135–145)
Total Bilirubin: 0.6 mg/dL (ref 0.3–1.2)
Total Protein: 6.3 g/dL — ABNORMAL LOW (ref 6.5–8.1)

## 2018-05-09 LAB — MAGNESIUM: Magnesium: 1.8 mg/dL (ref 1.7–2.4)

## 2018-05-09 MED ORDER — DIPHENHYDRAMINE HCL 25 MG PO CAPS
50.0000 mg | ORAL_CAPSULE | Freq: Once | ORAL | Status: AC
  Administered 2018-05-09: 50 mg via ORAL
  Filled 2018-05-09: qty 2

## 2018-05-09 MED ORDER — ACETAMINOPHEN 325 MG PO TABS
650.0000 mg | ORAL_TABLET | Freq: Once | ORAL | Status: AC
  Administered 2018-05-09: 650 mg via ORAL
  Filled 2018-05-09: qty 2

## 2018-05-09 MED ORDER — SODIUM CHLORIDE 0.9 % IV SOLN
Freq: Once | INTRAVENOUS | Status: AC
  Administered 2018-05-09: 10:00:00 via INTRAVENOUS
  Filled 2018-05-09: qty 250

## 2018-05-09 MED ORDER — SODIUM CHLORIDE 0.9 % IV SOLN
420.0000 mg | Freq: Once | INTRAVENOUS | Status: AC
  Administered 2018-05-09: 420 mg via INTRAVENOUS
  Filled 2018-05-09: qty 14

## 2018-05-09 MED ORDER — TRASTUZUMAB CHEMO 150 MG IV SOLR
500.0000 mg | Freq: Once | INTRAVENOUS | Status: AC
  Administered 2018-05-09: 500 mg via INTRAVENOUS
  Filled 2018-05-09: qty 23.81

## 2018-05-09 MED ORDER — AZITHROMYCIN 250 MG PO TABS: ORAL_TABLET | ORAL | 0 refills | Status: DC

## 2018-05-09 MED ORDER — HEPARIN SOD (PORK) LOCK FLUSH 100 UNIT/ML IV SOLN: 500.0000 [IU] | Freq: Once | INTRAVENOUS | Status: DC | PRN

## 2018-05-09 MED ORDER — HEPARIN SOD (PORK) LOCK FLUSH 100 UNIT/ML IV SOLN
500.0000 [IU] | Freq: Once | INTRAVENOUS | Status: AC
  Administered 2018-05-09: 500 [IU] via INTRAVENOUS
  Filled 2018-05-09: qty 5

## 2018-05-09 MED ORDER — SODIUM CHLORIDE 0.9% FLUSH
10.0000 mL | INTRAVENOUS | Status: DC | PRN
  Administered 2018-05-09: 10 mL via INTRAVENOUS
  Filled 2018-05-09: qty 10

## 2018-05-09 NOTE — Progress Notes (Signed)
Pt declines to stay for 30 minutes post Perjeta observation. Pt tolerated infusion well. Pt and VS stable at discharge.

## 2018-05-09 NOTE — Progress Notes (Signed)
Patient ID: Denise [name], female   DOB: [birth year], 42 y.o.   MRN: [id number]  Chief Complaint  Patient presents with  . Pre-op Exam    HPI Denise [name] is a 42 y.o. female here today f\or her pre op left mastectomy scheduled for 05/20/2018.Patient states she is doing well at this time.  HPI  Past Medical History:  Diagnosis Date  . Abnormal breast biopsy    PASH, Dr. Lemar [name]  . Family history of breast cancer   . Family history of ovarian cancer   . Fibroadenoma of breast, left 2000, 2016  . Genetic screening 08/26/2013   BRCA/BART negative/[facility], CHEK2 POS 2017  . Increased risk of breast cancer 2017   IBIS=47%  . Left breast lump 06/18/2017   Fluid/drained Denise [name]  . Migraine   . Monoallelic mutation of CHEK2 gene in female patient 2017   increased risk of breast and colon cancer    Past Surgical History:  Procedure Laterality Date  . BREAST BIOPSY Bilateral 09/08/14   neg  . BREAST BIOPSY Left 03/16/2015   Procedure: BREAST BIOPSY WITH NEEDLE LOCALIZATION;  Surgeon: Denise Mayotte, MD;  Location: [facility] ORS;  Service: General;  Laterality: Left;  . BREAST CYST ASPIRATION Left 06/18/2017   cyst aspiration  . BREAST EXCISIONAL BIOPSY Left 09/2014  . BREAST EXCISIONAL BIOPSY Right    age 5's  . BREAST SURGERY Right March 2000   benign fibroadenoma  . BREAST SURGERY Left 08/08/13   excision  . BREAST SURGERY Left 09/23/14   excision  . PORTACATH PLACEMENT Right 12/17/2017   Procedure: INSERTION PORT-A-CATH;  Surgeon: Denise Mayotte, MD;  Location: [facility] ORS;  Service: General;  Laterality: Right;    Family History  Problem Relation Age of Onset  . Prostate cancer Father 67  . Breast cancer Paternal Grandmother 80  . Breast cancer Maternal Aunt 60  . Ovarian cancer Maternal Aunt 70  . Breast cancer Paternal Aunt 29  . Pancreatic cancer Maternal Aunt 88    Social History Social History   Tobacco Use  . Smoking status: Never Smoker  . Smokeless  tobacco: Never Used  Substance Use Topics  . Alcohol use: Yes    Comment: occasionally  . Drug use: No    Allergies  Allergen Reactions  . Steri-Strip Compound Benzoin [Benzoin Compound]     Steri-strip ,   . Tape Other (See Comments)    Minor skin irritation     Current Outpatient Medications  Medication Sig Dispense Refill  . acetaminophen (TYLENOL) 500 MG tablet Take 500 mg by mouth every 6 (six) hours as needed for moderate pain or headache.    Marland Kitchen azithromycin (ZITHROMAX) 250 MG tablet 2 tabs (500 mg) x 1 day, then 1 tab (250 mg) x 4 days. 6 tablet 0  . Chlorphen-Pseudoephed-APAP (CORICIDIN D PO) Take by mouth.    Marland Kitchen omeprazole (PRILOSEC OTC) 20 MG tablet Take 20 mg by mouth daily as needed (for acid reflux).    . venlafaxine XR (EFFEXOR-XR) 37.5 MG 24 hr capsule Take 1 capsule (37.5 mg total) by mouth daily with breakfast. 30 capsule 2   No current facility-administered medications for this visit.    Facility-Administered Medications Ordered in Other Visits  Medication Dose Route Frequency Provider Last Rate Last Dose  . sodium chloride flush (NS) 0.9 % injection 10 mL  10 mL Intravenous PRN Denise Bath, MD   10 mL at 04/17/18 0844    Review of  Systems Review of Systems  Constitutional: Negative.   Respiratory: Negative.   Cardiovascular: Negative.     Blood pressure 130/85, pulse (!) 101, temperature 97.7 F (36.5 C), temperature source Skin, resp. rate 13, height 5\' 4"  (1.626 m), weight 187 lb (84.8 kg), SpO2 99 %.  Physical Exam Physical Exam  Constitutional: She is oriented to person, place, and time. She appears well-developed and well-nourished.  Cardiovascular: Normal rate, regular rhythm and normal heart sounds.  Pulmonary/Chest: Effort normal and breath sounds normal.    Lymphadenopathy:    She has cervical adenopathy.    She has axillary adenopathy (fullness left mid-axilla).       Right: No supraclavicular adenopathy present.       Left: No  supraclavicular adenopathy present.  Neurological: She is alert and oriented to person, place, and time.  Skin: Skin is warm and dry.    Data Reviewed Ultrasound examination was undertaken to assess visibility of the previously biopsied axillary node.  At the level of the sternal notch a 0.84 x 1.03 x 1.16 lymph node is identified corresponding to the previously noted lesion.  BI-RADS-6.  Assessment    Completion of neoadjuvant chemotherapy, plans for left mastectomy, sentinel node biopsy with possible axillary dissection followed by immediate reconstruction.  Plan  The surgical procedure was reviewed.  Anticipated time away from activities reviewed.  HPI, Physical Exam, Assessment and Plan have been scribed under the direction and in the presence of Denise Curry, MD.  Denise [name], CMA   I have completed the exam and reviewed the above documentation for accuracy and completeness.  I agree with the above.  Denise [name] has been used and any errors in dictation or transcription are unintentional.  Denise [name], M.D., F.A.C.S.   Denise [name] 05/09/2018, 9:02 PM

## 2018-05-09 NOTE — Progress Notes (Signed)
Patient had had a cold and is currently taking Coricidin.  Offers no other complaints today.

## 2018-05-09 NOTE — Progress Notes (Signed)
[facility] day:  05/09/18  Chief Complaint: Denise [name] is a 42 y.o. female with multi-focal Her2/neu + left breast cancer who is seen for assessment prior to ongoing neoadjuvant Herceptin and Perjeta.   HPI: The patient was last seen in the medical oncology clinic on 04/17/2018.  At that time,  she was doing well. She denied any major side effects associated with her last chemotherapy cycle.  Vasomotor symptoms and sleep had improved with addition of low dose venlafaxine. Exam was grossly unremarkable.  WBC was 12,000 (ANC 11,000).  Hemoglobin was 10.6, hematocrit 31.7, and platelets 187,000.  Glucose was 218.  AST 50, ALT 53, ALP 56, and total bilirubin 0.6.  Magnesium was 1.5 mg/dL.  She received cycle #6 TCHP with Udencya support.  She received 2 gm of magnesium.  She is scheduled for surgery on 05/20/2018.  During the interim, patient has been having cold symptoms since 05/03/2018. She has been having congestion, post nasal drip, sore throat, and low grade temperatures (tmax 99). She has a "phlegmy" cough. Sputum is yellow in color. Cough is only in the am and when supine. Patient has treating with conservative measures, which have caused her symptoms to wax and wane. She began Corcidin yesterday, which also has helped improved her symptoms. Patient denies any chills.   She has not had any diarrhea in over a week. She notes that her vasomotor symptoms have resolved with the addition of the prescribed venlafaxine. Patient notes "chemo acne" the week after chemotherapy treatment. She states, "I have little white heads popping up on my arms. Some are little red dots. They have lasted longer this time.   Patient advises that she maintains an adequate appetite. She is eating well. Weight today is 185 lb 5 oz (84.1 kg), which compared to her last visit to the clinic, represents a 2 pound increase.    Patient denies pain in the clinic  today.   Past Medical History:  Diagnosis Date  . Abnormal breast biopsy    PASH, [name]  . Family history of breast cancer   . Family history of ovarian cancer   . Fibroadenoma of breast, left 2000, 2016  . Genetic screening 08/26/2013   BRCA/BART negative/[facility], CHEK2 POS 2017  . Increased risk of breast cancer 2017   IBIS=47%  . Left breast lump 06/18/2017   Fluid/drained [name]  . Migraine   . Monoallelic mutation of CHEK2 gene in female patient 2017   increased risk of breast and colon cancer    Past Surgical History:  Procedure Laterality Date  . BREAST BIOPSY Bilateral 09/08/14   neg  . BREAST BIOPSY Left 03/16/2015   Procedure: BREAST BIOPSY WITH NEEDLE LOCALIZATION;  Surgeon: [name], MD;  Location: [facility] ORS;  Service: General;  Laterality: Left;  . BREAST CYST ASPIRATION Left 06/18/2017   cyst aspiration  . BREAST EXCISIONAL BIOPSY Left 09/2014  . BREAST EXCISIONAL BIOPSY Right    age 75's  . BREAST SURGERY Right March 2000   benign fibroadenoma  . BREAST SURGERY Left 08/08/13   excision  . BREAST SURGERY Left 09/23/14   excision  . PORTACATH PLACEMENT Right 12/17/2017   Procedure: INSERTION PORT-A-CATH;  Surgeon: [name], MD;  Location: [facility] ORS;  Service: General;  Laterality: Right;    Family History  Problem Relation Age of Onset  . Prostate cancer Father 30  . Breast cancer Paternal Grandmother 14  . Breast  cancer Maternal Aunt 60  . Ovarian cancer Maternal Aunt 70  . Breast cancer Paternal Aunt 11  . Pancreatic cancer Maternal Aunt 75    Social History:  reports that she has never smoked. She has never used smokeless tobacco. She reports that she drinks alcohol. She reports that she does not use drugs.  She does not smoke. She "occasionally" drinks alcohol. 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 Patient denies known exposures to radiation on toxins. Her husband's name is [name]. She has 2 daughters,  [name] and [name] (ages 61 1/2 and 58).  Her husband's name is [name].  The patient is accompanied by her husband today.  Allergies:  Allergies  Allergen Reactions  . Tape Other (See Comments)    Minor skin irritation     Current Medications: Current Outpatient Medications  Medication Sig Dispense Refill  . acetaminophen (TYLENOL) 500 MG tablet Take 500 mg by mouth every 6 (six) hours as needed for moderate pain or headache.    . Chlorphen-Pseudoephed-APAP (CORICIDIN D PO) Take by mouth.    Marland Kitchen omeprazole (PRILOSEC OTC) 20 MG tablet Take 20 mg by mouth daily as needed (for acid reflux).    . venlafaxine XR (EFFEXOR-XR) 37.5 MG 24 hr capsule Take 1 capsule (37.5 mg total) by mouth daily with breakfast. 30 capsule 2   No current facility-administered medications for this visit.    Facility-Administered Medications Ordered in Other Visits  Medication Dose Route Frequency Provider Last Rate Last Dose  . heparin lock flush 100 unit/mL  500 Units Intravenous Once Corcoran, Melissa C, MD      . sodium chloride flush (NS) 0.9 % injection 10 mL  10 mL Intravenous PRN Lequita Asal, MD   10 mL at 04/17/18 0844  . sodium chloride flush (NS) 0.9 % injection 10 mL  10 mL Intravenous PRN Lequita Asal, MD   10 mL at 05/09/18 0855    Review of Systems  Constitutional: Negative for chills, diaphoresis, fever, malaise/fatigue and weight loss (up 2 pounds).       Feels "ok".   HENT: Positive for congestion and sore throat. Negative for ear discharge, ear pain, nosebleeds and tinnitus.   Eyes: Negative.  Negative for blurred vision, double vision, photophobia, pain, discharge and redness.  Respiratory: Positive for cough and sputum production (yellow). Negative for hemoptysis and shortness of breath.   Cardiovascular: Negative.  Negative for chest pain, palpitations, orthopnea, leg swelling and PND.  Gastrointestinal: Positive for diarrhea (occasional). Negative for abdominal pain, blood in  stool, constipation, melena, nausea and vomiting.  Genitourinary: Negative for dysuria, frequency, hematuria and urgency.  Musculoskeletal: Negative for back pain, falls, joint pain and myalgias.  Skin: Negative.  Negative for itching and rash.       Acne.  Neurological: Negative for dizziness, tremors, sensory change, speech change, focal weakness, weakness and headaches.  Endo/Heme/Allergies: Does not bruise/bleed easily.       Diabetes - on metformin. Vasomotor symptoms- improved on Effexor.   Psychiatric/Behavioral: Negative for depression and memory loss. The patient is not nervous/anxious and does not have insomnia.   All other systems reviewed and are negative.  Performance status (ECOG): 1  Vital Signs BP 130/85 (BP Location: Left Arm, Patient Position: Sitting)   Pulse (!) 104   Temp 98.2 F (36.8 C) (Tympanic)   Resp 18   Wt 185 lb 5 oz (84.1 kg)   BMI 29.02 kg/m   Physical Exam  Constitutional:  She is oriented to person, place, and time and well-developed, well-nourished, and in no distress. No distress.  HENT:  Head: Normocephalic and atraumatic. Hair is abnormal (chemotherapy induced alopecia; wearing a scarf).  Mouth/Throat: Oropharynx is clear and moist and mucous membranes are normal.  Eyes: Pupils are equal, round, and reactive to light. Conjunctivae and EOM are normal. No scleral icterus.  Neck: Normal range of motion. Neck supple. No JVD present.  Cardiovascular: Normal rate, regular rhythm, normal heart sounds and intact distal pulses. Exam reveals no gallop and no friction rub.  No murmur heard. Pulmonary/Chest: Effort normal and breath sounds normal. No respiratory distress. She has no wheezes. She has no rales.  PowerPort to right chest wall.  Abdominal: Soft. Bowel sounds are normal. She exhibits no distension and no mass. There is no abdominal tenderness. There is no rebound and no guarding.  Musculoskeletal: Normal range of motion.        General: No  tenderness or edema.  Lymphadenopathy:    She has no cervical adenopathy.    She has no axillary adenopathy.       Right: No inguinal and no supraclavicular adenopathy present.       Left: No inguinal and no supraclavicular adenopathy present.  Neurological: She is alert and oriented to person, place, and time. Gait normal.  Skin: Skin is warm and dry. No rash noted. She is not diaphoretic. No erythema.  Psychiatric: Mood, affect and judgment normal.  Nursing note and vitals reviewed.   Infusion on 05/09/2018  Component Date Value Ref Range Status  . WBC 05/09/2018 7.5  4.0 - 10.5 K/uL Final  . RBC 05/09/2018 3.40* 3.87 - 5.11 MIL/uL Final  . Hemoglobin 05/09/2018 10.3* 12.0 - 15.0 g/dL Final  . HCT 05/09/2018 32.0* 36.0 - 46.0 % Final  . MCV 05/09/2018 94.1  80.0 - 100.0 fL Final  . MCH 05/09/2018 30.3  26.0 - 34.0 pg Final  . MCHC 05/09/2018 32.2  30.0 - 36.0 g/dL Final  . RDW 05/09/2018 15.7* 11.5 - 15.5 % Final  . Platelets 05/09/2018 214  150 - 400 K/uL Final  . nRBC 05/09/2018 0.0  0.0 - 0.2 % Final  . Neutrophils Relative % 05/09/2018 71  % Final  . Neutro Abs 05/09/2018 5.3  1.7 - 7.7 K/uL Final  . Lymphocytes Relative 05/09/2018 19  % Final  . Lymphs Abs 05/09/2018 1.4  0.7 - 4.0 K/uL Final  . Monocytes Relative 05/09/2018 10  % Final  . Monocytes Absolute 05/09/2018 0.7  0.1 - 1.0 K/uL Final  . Eosinophils Relative 05/09/2018 0  % Final  . Eosinophils Absolute 05/09/2018 0.0  0.0 - 0.5 K/uL Final  . Basophils Relative 05/09/2018 0  % Final  . Basophils Absolute 05/09/2018 0.0  0.0 - 0.1 K/uL Final  . Immature Granulocytes 05/09/2018 0  % Final  . Abs Immature Granulocytes 05/09/2018 0.02  0.00 - 0.07 K/uL Final   Performed at [facility], [address] [place], [place] [address]  . Magnesium 05/09/2018 1.8  1.7 - 2.4 mg/dL Final   Performed at [facility], [address] [place], [place] [address]  . Sodium 05/09/2018 140  135 - 145 mmol/L Final  .  Potassium 05/09/2018 4.0  3.5 - 5.1 mmol/L Final  . Chloride 05/09/2018 108  98 - 111 mmol/L Final  . CO2 05/09/2018 26  22 - 32 mmol/L Final  . Glucose, Bld 05/09/2018 111* 70 - 99 mg/dL Final  .  BUN 05/09/2018 11  6 - 20 mg/dL Final  . Creatinine, Ser 05/09/2018 0.50  0.44 - 1.00 mg/dL Final  . Calcium 05/09/2018 8.9  8.9 - 10.3 mg/dL Final  . Total Protein 05/09/2018 6.3* 6.5 - 8.1 g/dL Final  . Albumin 05/09/2018 3.7  3.5 - 5.0 g/dL Final  . AST 05/09/2018 39  15 - 41 U/L Final  . ALT 05/09/2018 46* 0 - 44 U/L Final  . Alkaline Phosphatase 05/09/2018 54  38 - 126 U/L Final  . Total Bilirubin 05/09/2018 0.6  0.3 - 1.2 mg/dL Final  . GFR calc non Af Amer 05/09/2018 >60  >60 mL/min Final  . GFR calc Af Amer 05/09/2018 >60  >60 mL/min Final   Comment: (NOTE) The eGFR has been calculated using the CKD EPI equation. This calculation has not been validated in all clinical situations. eGFR's persistently <60 mL/min signify possible Chronic Kidney Disease.   Georgiann Hahn gap 05/09/2018 6  5 - 15 Final   Performed at [facility], [address] [place], [place] [address]    Assessment:  Denise [name] is a 42 y.o. female with multi-focal stage IB Her2/neu + left breast cancer s/p index breast mass and axillary node biopsy on 12/06/2017.  Pathology revealed grade II invasive ductal carcinoma with calcifications at the 11 o'clock position.  There was metastatic carcinoma in 1 of 1 lymph nodes.  Tumor was ER + (100%), PR + (100%), and Ki67 5%.  Her2/neu was 3+ by IHC (heterogeneous) and FISH +.  Diagnostic left mammogram and ultrasound on 12/06/2017 revealed a 1.7 x 1.3 x 1.3 cm irregular hypoechoic mass with internal calcifications at the 11 o'clock position 2 cm from the nipple with internal calcifications. There was a similar appearing 0.9 x 0.8 x 0.7 cm mass at the 11 o'clock position 4 cm from the nipple (2.4 cm from the index lesion).  There were suspicious, segmental calcifications  originating from the index lesion and extending 5 cm posteriorly.  There was a 0.6 cm morphologically abnormal left axillary lymph node.  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 The adjacent and more superiorly located 11 o'clock mass (7 x 12 mm) seen on recent ultrasound, 4 cm from the nipple on ultrasound, was also identified. There were 2 probable satellite lesions (7 mm, medial lesion; posterior and lateral lesion). The total span of malignancy was 3 x 3.1 x 4.5 cm.  There were calcifications extending up to 5 cm posterior to the biopsied malignancy which were highly suspicious on mammography but not appreciated.  There was a mass in the lower outer left breast measured 5 mm, representing a change.  The known metastatic node in the left axilla was identified. A node along the posterolateral margin of the pectoralis minor (cortex 5 mm) was nonspecific but at least somewhat suspicious.  Chest, abdomen, and pelvic CT on 12/19/2017 revealed a solitary enlarged biopsy-proven metastatic left axillary lymph node.  There were no additional findings suspicious for metastatic disease in the chest, abdomen or pelvis.  There was a nonspecific tiny sclerotic upper right sacral lesion, more likely a benign bone island. Bone scintigraphy correlation versus follow-up CT could be considered.  Bone scan on 01/11/2018 was negative for metastatic pattern. No areas of focal tracer uptake. Area of concern in sacrum on CT likely represents benign bone island.   [facility]  genetic testing on 08/26/2013 was negative for BRCA1 and 2. 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  She has a family history significant for breast, ovarian, pancreatic, and prostate cancer.  She is s/p 6 cycles of TCHP chemotherapy (12/21/2017 - 04/17/2018) with Margarette Canada support. Cycle #3 was held due to  elevated LFTs on 02/04/2018. She is tolerating treatment well.  Echo on 12/20/2017 revealed an EF of 60-65%.  Echo on 03/27/2018 revealed an EF of 60-65%.  She has a history of elevated LFTs.  Hepatitis B and C serologies were negative on 02/04/2018.  RUQ ultrasound on 02/06/2018 revealed a small anterior gallbladder wall polyp. There was no evidence of cholelithiasis or cholecystis. CBD measured normal at 2 mm, with no evidence of choledocholithiasis. There was normal direction of blood flow towards the liver noted.  Symptomatically, she has bronchitis.  She denies any breast concerns.  Vasomotor symptoms have improved on Effexor.  Exam is stable.  Plan: 1. Labs today:  CBC with diff, CMP, Mg. 2. Multifocal stage IB left breast cancer Clinically doing well. Surgery planned 05/20/2018. Follow-up echocardiogram on 06/27/2018. Continue Herceptin + Perjeta alone.  Plan 1 year of therapy.  Consider Kadcyla if residual disease at time of surgery. Labs reviewed. Blood counts stable and adequate enough for treatment. Urine HCG (-). Will proceed with Herceptin and Perjeta. Discuss symptom management.  She has antiemetics and pain medications at home to use on a prn bases.  Interventions are adequate.     3. Transaminitis  Improved. AST 39, ALT 46, ALP 54, and total bilirubin 0.6. 4. Vasomotor symptoms  Improved on Effexor 37.5 mg a day.   5. Hypomagnesemia  Magnesium 1.8 today.   Continue routine monitoring.  6. Hyperglycemia  Blood sugar 111 and under better control.  7. Diarrhea  Occasionally.  No intervention needed. 8. Bronchitis  Rx:  Azithromycin (Z-pack). 9.   RTC on 05/30/2018 for MD assessment, labs (CBC with diff, CMP, Mg), and continuation of Herceptin + Perjeta    Honor Loh, NP  05/09/18, 9:33 AM   I saw and evaluated the patient, participating in the key portions of the service and reviewing pertinent diagnostic studies and records.  I reviewed the nurse  practitioner's note and agree with the findings and the plan.  The assessment and plan were discussed with the patient.  Multiple questions were asked by the patient and answered.    Nolon Stalls, MD 05/09/2018,9:33 AM

## 2018-05-10 ENCOUNTER — Encounter
Admission: RE | Admit: 2018-05-10 | Discharge: 2018-05-10 | Disposition: A | Discharge status: Home / Self Care | Payer: BC Managed Care – PPO | Source: Ambulatory Visit | Attending: General Surgery | Admitting: General Surgery

## 2018-05-10 ENCOUNTER — Encounter: Payer: Self-pay | Admitting: General Surgery

## 2018-05-10 ENCOUNTER — Other Ambulatory Visit: Payer: Self-pay | Admitting: Hematology and Oncology

## 2018-05-10 DIAGNOSIS — R748 Abnormal levels of other serum enzymes: Secondary | ICD-10-CM

## 2018-05-10 NOTE — Patient Instructions (Signed)
Your procedure is scheduled on: 05-20-18 Report to [facility] @ [phone]  Remember: Instructions that are not followed completely may result in serious medical risk, up to and including death, or upon the discretion of your surgeon and anesthesiologist your surgery may need to be rescheduled.    _x___ 1. Do not eat food after midnight the night before your procedure. You may drink clear liquids up to 2 hours before you are scheduled to arrive at the hospital for your procedure.  Do not drink clear liquids within 2 hours of your scheduled arrival to the hospital.  Clear liquids include  --Water or Apple juice without pulp  --Clear carbohydrate beverage such as ClearFast or Gatorade  --Black Coffee or Clear Tea (No milk, no creamers, do not add anything to the coffee or Tea   ____Ensure clear carbohydrate drink on the way to the hospital for bariatric patients  ____Ensure clear carbohydrate drink 3 hours before surgery for Dr Dwyane Luo patients if physician instructed.   No gum chewing or hard candies.     __x__ 2. No Alcohol for 24 hours before or after surgery.   __x__3. No Smoking or e-cigarettes for 24 prior to surgery.  Do not use any chewable tobacco products for at least 6 hour prior to surgery   ____  4. Bring all medications with you on the day of surgery if instructed.    __x__ 5. Notify your doctor if there is any change in your medical condition     (cold, fever, infections).    x___6. On the morning of surgery brush your teeth with toothpaste and water.  You may rinse your mouth with mouth wash if you wish.  Do not swallow any toothpaste or mouthwash.   Do not wear jewelry, make-up, hairpins, clips or nail polish.  Do not wear lotions, powders, or perfumes. You may wear deodorant.  Do not shave 48 hours prior to surgery. Men may shave face and neck.  Do not bring valuables to the hospital.    [facility] is not responsible for any belongings or  valuables.               Contacts, dentures or bridgework may not be worn into surgery.  Leave your suitcase in the car. After surgery it may be brought to your room.  For patients admitted to the hospital, discharge time is determined by your treatment team.  _  Patients discharged the day of surgery will not be allowed to drive home.  You will need someone to drive you home and stay with you the night of your procedure.    Please read over the following fact sheets that you were given:   [facility] Preparing for Surgery and or MRSA Information   _x___ TAKE THE FOLLOWING MEDICATION THE MORNING OF SURGERY WITH A SMALL SIP OF WATER. These include:  1. PRILOSEC   2. TAKE A PRILOSEC THE NIGHT BEFORE YOUR SURGERY  3. EFFEXOR  4.  5.  6.  ____Fleets enema or Magnesium Citrate as directed.   ____ Use CHG Soap or sage wipes as directed on instruction sheet   ____ Use inhalers on the day of surgery and bring to hospital day of surgery  ____ Stop Metformin and Janumet 2 days prior to surgery.    ____ Take 1/2 of usual insulin dose the night before surgery and none on the morning surgery.   ____ Follow recommendations from Cardiologist, Pulmonologist or PCP regarding stopping  Aspirin, Coumadin, Plavix ,Eliquis, Effient, or Pradaxa, and Pletal.  ____Stop Anti-inflammatories such as Advil, Aleve, Ibuprofen, Motrin, Naproxen, Naprosyn, Goodies powders or aspirin products. OK to take Tylenol    ____ Stop supplements until after surgery.     ____ Bring C-Pap to the hospital.

## 2018-05-10 NOTE — Pre-Procedure Instructions (Signed)
Denise [name]  ECHO COMPLETE WO IMAGING ENHANCING AGENT  Order# [id number]  Reading physician: Minna Merritts, MD Ordering physician: Karen Kitchens, NP Study date: 03/27/18  Study Result   Result status: Final result                   [facility]*                       [place], [place] [address]                            [phone]  ------------------------------------------------------------------- Transthoracic Echocardiography  Patient:    Denise [name], Denise [name] MR #:       [id number] Study Date: 03/27/2018 Gender:     F Age:        42 Height:     165.1 cm Weight:     82.6 kg BSA:        1.97 m^2 Pt. Status: Room:   SONOGRAPHER  [facility] RD[facility]  PERFORMING   [facility]  REFERRING    Venetia Maxon, Corene Cornea Hestle  ATTENDING    Winifred Olive E  REFERRING    Honor Loh E  cc:  ------------------------------------------------------------------- LV EF: 60% -   65%  ------------------------------------------------------------------- Indications:      Encounter for antineoplastic chemotherapy, Pre- procedural examination.  ------------------------------------------------------------------- History:   PMH:  Family history of breast and ovarian cancer, migraine, fibroadenomia of left breast.  ------------------------------------------------------------------- Study Conclusions  - Left ventricle: The cavity size was normal. Systolic function was   normal. The estimated ejection fraction was in the range of 60%   to 65%. Wall motion was normal; there were no regional wall   motion abnormalities. Doppler parameters are consistent with   abnormal left ventricular relaxation (grade 1 diastolic   dysfunction). - Left atrium: The atrium was normal in size. - Right ventricle: Systolic function was normal. - Pulmonary arteries: Systolic pressure was within the normal    range.  ------------------------------------------------------------------- Study data:   Study status:  Routine.  Procedure:  The patient reported no pain pre or post test. Transthoracic echocardiography. Image quality was good.          Transthoracic echocardiography. M-mode, complete 2D, spectral Doppler, and color Doppler. Birthdate:  Patient birthdate: 1976/03/18.  Age:  Patient is 42 yr old.  Sex:  Gender: female.    BMI: 30.3 kg/m^2.  Blood pressure:   116/72  Patient status:  Outpatient.  Study date:  Study date: 03/27/2018. Study time: 11:17 AM.  Location:  Echo laboratory.  -------------------------------------------------------------------  ------------------------------------------------------------------- Left ventricle:  The cavity size was normal. Systolic function was normal. The estimated ejection fraction was in the range of 60% to 65%. Wall motion was normal; there were no regional wall motion abnormalities. Doppler parameters are consistent with abnormal left ventricular relaxation (grade 1 diastolic dysfunction).  ------------------------------------------------------------------- Aortic valve:  Poorly visualized.  Trileaflet; normal thickness leaflets. Mobility was not restricted.  Doppler:  Transvalvular velocity was within the normal range. There was no stenosis. There was no regurgitation.    Peak velocity ratio of LVOT to aortic valve: 0.88. Valve area (Vmax): 2.77 cm^2. Indexed valve area (Vmax): 1.4 cm^2/m^2.    Peak  gradient (S): 7 mm Hg.  ------------------------------------------------------------------- Aorta:  Aortic root: The aortic root was normal in size.  ------------------------------------------------------------------- Mitral valve:   Structurally normal valve.   Mobility was not restricted.  Doppler:  Transvalvular velocity was within the normal range. There was no evidence for stenosis. There was no regurgitation.    Peak gradient  (D): 2 mm Hg.  ------------------------------------------------------------------- Left atrium:  The atrium was normal in size.  ------------------------------------------------------------------- Right ventricle:  The cavity size was normal. Wall thickness was normal. Systolic function was normal.  ------------------------------------------------------------------- Pulmonic valve:    Doppler:  Transvalvular velocity was within the normal range. There was no evidence for stenosis.  ------------------------------------------------------------------- Tricuspid valve:   Structurally normal valve.    Doppler: Transvalvular velocity was within the normal range. There was mild regurgitation.  ------------------------------------------------------------------- Pulmonary artery:   The main pulmonary artery was normal-sized. Systolic pressure was within the normal range.  ------------------------------------------------------------------- Right atrium:  The atrium was normal in size.  ------------------------------------------------------------------- Pericardium:  There was no pericardial effusion.  ------------------------------------------------------------------- Systemic veins: Inferior vena cava: The vessel was normal in size. The respirophasic diameter changes were in the normal range (>= 50%), consistent with normal central venous pressure.  ------------------------------------------------------------------- Measurements   Left ventricle                           Value          Reference  LV ID, ED, PLAX chordal                  47    mm       43 - 52  LV ID, ES, PLAX chordal                  30.9  mm       23 - 38  LV fx shortening, PLAX chordal           34    %        >=29  LV PW thickness, ED                      9.48  mm       ----------  IVS/LV PW ratio, ED                      0.9            <=1.3  LV e&', lateral                           9.57  cm/s      ----------  LV E/e&', lateral                         8.06           ----------  LV e&', medial                            7.18  cm/s     ----------  LV E/e&', medial                          10.74          ----------  LV e&', average  8.38  cm/s     ----------  LV E/e&', average                         9.21           ----------    Ventricular septum                       Value          Reference  IVS thickness, ED                        8.55  mm       ----------    LVOT                                     Value          Reference  LVOT ID, S                               20    mm       ----------  LVOT area                                3.14  cm^2     ----------  LVOT peak velocity, S                    114   cm/s     ----------  LVOT peak gradient, S                    5     mm Hg    ----------    Aortic valve                             Value          Reference  Aortic valve peak velocity, S            129   cm/s     ----------  Aortic peak gradient, S                  7     mm Hg    ----------  Velocity ratio, peak, LVOT/AV            0.88           ----------  Aortic valve area, peak velocity         2.77  cm^2     ----------  Aortic valve area/bsa, peak              1.4   cm^2/m^2 ----------  velocity    Aorta                                    Value          Reference  Aortic root ID, ED                       27    mm       ----------  Left atrium                              Value          Reference  LA ID, A-P, ES                           33    mm       ----------  LA ID/bsa, A-P                           1.67  cm/m^2   <=2.2  LA volume, S                             52.2  ml       ----------  LA volume/bsa, S                         26.5  ml/m^2   ----------  LA volume, ES, 1-p A4C                   54.9  ml       ----------  LA volume/bsa, ES, 1-p A4C               27.8  ml/m^2   ----------  LA volume, ES, 1-p A2C                   45.1  ml        ----------  LA volume/bsa, ES, 1-p A2C               22.9  ml/m^2   ----------    Mitral valve                             Value          Reference  Mitral E-wave peak velocity              77.1  cm/s     ----------  Mitral A-wave peak velocity              94.1  cm/s     ----------  Mitral deceleration time         (L)     4     ms       150 - 230  Mitral peak gradient, D                  2     mm Hg    ----------  Mitral E/A ratio, peak                   0.8            ----------    Right atrium                             Value          Reference  RA ID, S-I, ES, A4C                      41.8  mm       34 - 49  RA  area, ES, A4C                         13.6  cm^2     8.3 - 19.5  RA volume, ES, A/L                       35.3  ml       ----------  RA volume/bsa, ES, A/L                   17.9  ml/m^2   ----------    Right ventricle                          Value          Reference  RV ID, ED, PLAX                          29.6  mm       19 - 38  RV ID, minor axis, ED, A4C base          31.3  mm       ----------  TAPSE                                    28.4  mm       ----------  RV s&', lateral, S                        17.2  cm/s     ----------    Pulmonic valve                           Value          Reference  Pulmonic valve peak velocity, S          63.5  cm/s     ----------  Legend: (L)  and  (H)  mark values outside specified reference range.  ------------------------------------------------------------------- Prepared and Electronically Authenticated by  Esmond Plants, MD, [facility] 2019-10-09T13:04:13  [facility] Images   Show images for ECHOCARDIOGRAM COMPLETE  Patient Information   Patient Name Denise [name], Denise [name] Sex Female DOB [birth year] SSN [id number]  Reason for Exam  Priority: STAT  Dx: Encounter for antineoplastic chemotherapy [Z51.11 (ICD-10-CM)]; Pre-procedural examination [M09.470 (ICD-10-CM)]  Comments:   Surgical History   Surgical History   No past  medical history on file.    Other Surgical History   Procedure Laterality Date Comment Source  BREAST BIOPSY Bilateral 09/08/14 neg Provider  BREAST BIOPSY Left 03/16/2015 Procedure: BREAST BIOPSY WITH NEEDLE LOCALIZATION; Surgeon: [name], MD; Location: [facility] ORS; Service: General; Laterality: Left; Provider  BREAST CYST ASPIRATION Left 06/18/2017 cyst aspiration Provider  BREAST EXCISIONAL BIOPSY Left 09/2014  Provider  BREAST EXCISIONAL BIOPSY Right  age 63's Provider  BREAST SURGERY Right March 2000 benign fibroadenoma Provider  BREAST SURGERY Left 08/08/13 excision Provider  BREAST SURGERY Left 09/23/14 excision Provider  PORTACATH PLACEMENT Right 12/17/2017 Procedure: INSERTION PORT-A-CATH; Surgeon: [name], MD; Location: [facility] ORS; Service: General; Laterality: Right; Provider    Performing Technologist/Nurse   Performing Technologist/Nurse: Gabriel Cirri                    Implants  No active implants to display in this view.  Order-Level Documents:   There are no order-level documents.  Encounter-Level Documents - 03/27/2018:   Electronic signature on 03/27/2018 10:42 AM - Signed      Signed   Electronically signed by Minna Merritts, MD on 03/27/18 at 100 EDT  Printable Result Report   Result Report   External Result Report   External Result Report

## 2018-05-13 ENCOUNTER — Telehealth: Payer: Self-pay | Admitting: *Deleted

## 2018-05-13 ENCOUNTER — Encounter: Payer: Self-pay | Admitting: *Deleted

## 2018-05-13 NOTE — Telephone Encounter (Signed)
Letter to be picked up

## 2018-05-13 NOTE — Telephone Encounter (Signed)
Patient called and stated that she needs a note for her work about her upcoming surgery that is scheduled from Monday 05/20/18

## 2018-05-13 NOTE — Telephone Encounter (Signed)
We can wrote a note for work . Please call office

## 2018-05-14 ENCOUNTER — Ambulatory Visit (INDEPENDENT_AMBULATORY_CARE_PROVIDER_SITE_OTHER): Payer: Self-pay | Admitting: Plastic Surgery

## 2018-05-14 ENCOUNTER — Encounter: Payer: Self-pay | Admitting: Plastic Surgery

## 2018-05-14 VITALS — BP 125/82 | HR 89 | Resp 12 | Ht 67.0 in | Wt 183.0 lb

## 2018-05-14 DIAGNOSIS — Z170 Estrogen receptor positive status [ER+]: Secondary | ICD-10-CM

## 2018-05-14 DIAGNOSIS — C50412 Malignant neoplasm of upper-outer quadrant of left female breast: Secondary | ICD-10-CM

## 2018-05-14 NOTE — Progress Notes (Signed)
Patient ID: Denise [name], female    DOB: [birth year], 42 y.o.   MRN: [id number]   Chief Complaint  Patient presents with  . Breast Cancer    The patient is a 42 yrs old wf here with her husband for a history and physical for breast reconstruction.  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  It was at the 11 o'clock position of the left breast.  She also has 1+ lymph node on the left side.  She works with the school system.  Her family history is positive for breast cancer.  She is not a smoker.  She is 5 feet 7 inches tall and weighs 180 pounds.  Preop bra = 36 C.  She was initially interested in bilateral mastectomies and may do a prophylactic mastectomy in the future.  For now she would like to have the left mastectomy with immediate reconstruction.  She has finished chemotherapy.  She has had no recent illnesses and seems to be emotionally ready for the surgery.    Review of Systems  Constitutional: Negative.  Negative for activity change and appetite change.  Eyes: Negative.   Respiratory: Negative.  Negative for chest tightness.   Cardiovascular: Negative.   Endocrine: Negative.   Musculoskeletal: Negative.   Skin: Negative.  Negative for color change and wound.  Hematological: Negative.   Psychiatric/Behavioral: Negative.     Past Medical History:  Diagnosis Date  . Abnormal breast biopsy    [facility], [name]  . Family history of breast cancer   . Family history of ovarian cancer   . Fibroadenoma of breast, left 2000, 2016  . Genetic screening 08/26/2013   BRCA/BART negative/[facility], CHEK2 POS 2017  . Increased risk of breast cancer 2017   IBIS=47%  . Left breast lump 06/18/2017   Fluid/drained [name]  . Migraine   . Monoallelic mutation of CHEK2 gene in female patient 2017   increased risk of breast and colon cancer    Past Surgical History:  Procedure Laterality Date  .  BREAST BIOPSY Bilateral 09/08/14   neg  . BREAST BIOPSY Left 03/16/2015   Procedure: BREAST BIOPSY WITH NEEDLE LOCALIZATION;  Surgeon: [name], MD;  Location: [facility] ORS;  Service: General;  Laterality: Left;  . BREAST CYST ASPIRATION Left 06/18/2017   cyst aspiration  . BREAST EXCISIONAL BIOPSY Left 09/2014  . BREAST EXCISIONAL BIOPSY Right    age 74's  . BREAST SURGERY Right March 2000   benign fibroadenoma  . BREAST SURGERY Left 08/08/13   excision  . BREAST SURGERY Left 09/23/14   excision  . PORTACATH PLACEMENT Right 12/17/2017   Procedure: INSERTION PORT-A-CATH;  Surgeon: [name], MD;  Location: [facility] ORS;  Service: General;  Laterality: Right;      Current Outpatient Medications:  .  acetaminophen (TYLENOL) 500 MG tablet, Take 500 mg by mouth every 6 (six) hours as needed for moderate pain or headache., Disp: , Rfl:  .  azithromycin (ZITHROMAX) 250 MG tablet, 2 tabs (500 mg) x 1 day, then 1 tab (250 mg) x 4 days., Disp: 6 tablet, Rfl: 0 .  Chlorphen-Pseudoephed-APAP (CORICIDIN D PO), Take by mouth., Disp: , Rfl:  .  omeprazole (PRILOSEC OTC) 20 MG tablet, Take 20 mg by mouth daily as needed (for acid reflux)., Disp: , Rfl:  .  venlafaxine XR (EFFEXOR-XR) 37.5 MG 24 hr capsule,  Take 1 capsule (37.5 mg total) by mouth daily with breakfast., Disp: 30 capsule, Rfl: 2 No current facility-administered medications for this visit.   Facility-Administered Medications Ordered in Other Visits:  .  sodium chloride flush (NS) 0.9 % injection 10 mL, 10 mL, Intravenous, PRN, Mike Gip, Melissa C, MD, 10 mL at 04/17/18 0844   Objective:   Vitals:   05/14/18 1537  BP: 125/82  Pulse: 89  Resp: 12  SpO2: 100%    Physical Exam  Constitutional: She is oriented to person, place, and time. She appears well-developed and well-nourished.  HENT:  Head: Normocephalic and atraumatic.  Eyes: Pupils are equal, round, and reactive to light. EOM are normal.  Cardiovascular: Normal rate.   Pulmonary/Chest: Effort normal.  Abdominal: Soft. She exhibits no distension. There is no tenderness.  Neurological: She is alert and oriented to person, place, and time.  Skin: Skin is warm.  Psychiatric: She has a normal mood and affect. Her behavior is normal. Judgment and thought content normal.    Assessment & Plan:  Malignant neoplasm of upper-outer quadrant of left breast in female, estrogen receptor positive ([place])  Plan is for a left immediate breast reconstruction with expander and Flex HD. The risks that can be encountered with and after placement of a breast expander placement were discussed and include the following but not limited to these: bleeding, infection, delayed healing, anesthesia risks, skin sensation changes, injury to structures including nerves, blood vessels, and muscles which may be temporary or permanent, allergies to tape, suture materials and glues, blood products, topical preparations or injected agents, skin contour irregularities, skin discoloration and swelling, deep vein thrombosis, cardiac and pulmonary complications, pain, which may persist, fluid accumulation, wrinkling of the skin over the expander, changes in nipple or breast sensation, expander leakage or rupture, faulty position of the expander, persistent pain, formation of tight scar tissue around the expander (capsular contracture), possible need for revisional surgery or staged procedures.   Cass, DO

## 2018-05-14 NOTE — H&P (View-Only) (Signed)
Patient ID: Denise [name], female    DOB: May 08, 1976, 42 y.o.   MRN: [id number]   Chief Complaint  Patient presents with  . Breast Cancer    The patient is a 42 yrs old wf here with her husband for a history and physical for breast reconstruction.  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  It was at the 11 o'clock position of the left breast.  She also has 1+ lymph node on the left side.  She works with the school system.  Her family history is positive for breast cancer.  She is not a smoker.  She is 5 feet 7 inches tall and weighs 180 pounds.  Preop bra = 36 C.  She was initially interested in bilateral mastectomies and may do a prophylactic mastectomy in the future.  For now she would like to have the left mastectomy with immediate reconstruction.  She has finished chemotherapy.  She has had no recent illnesses and seems to be emotionally ready for the surgery.    Review of Systems  Constitutional: Negative.  Negative for activity change and appetite change.  Eyes: Negative.   Respiratory: Negative.  Negative for chest tightness.   Cardiovascular: Negative.   Endocrine: Negative.   Musculoskeletal: Negative.   Skin: Negative.  Negative for color change and wound.  Hematological: Negative.   Psychiatric/Behavioral: Negative.     Past Medical History:  Diagnosis Date  . Abnormal breast biopsy    PASH, [name]  . Family history of breast cancer   . Family history of ovarian cancer   . Fibroadenoma of breast, left 2000, 2016  . Genetic screening 08/26/2013   BRCA/BART negative/Myriad, CHEK2 POS 2017  . Increased risk of breast cancer 2017   IBIS=47%  . Left breast lump 06/18/2017   Fluid/drained [name]  . Migraine   . Monoallelic mutation of CHEK2 gene in female patient 2017   increased risk of breast and colon cancer    Past Surgical History:  Procedure Laterality Date  .  BREAST BIOPSY Bilateral 09/08/14   neg  . BREAST BIOPSY Left 03/16/2015   Procedure: BREAST BIOPSY WITH NEEDLE LOCALIZATION;  Surgeon: [name], MD;  Location: [facility] ORS;  Service: General;  Laterality: Left;  . BREAST CYST ASPIRATION Left 06/18/2017   cyst aspiration  . BREAST EXCISIONAL BIOPSY Left 09/2014  . BREAST EXCISIONAL BIOPSY Right    age 42's  . BREAST SURGERY Right March 2000   benign fibroadenoma  . BREAST SURGERY Left 08/08/13   excision  . BREAST SURGERY Left 09/23/14   excision  . PORTACATH PLACEMENT Right 12/17/2017   Procedure: INSERTION PORT-A-CATH;  Surgeon: [name], MD;  Location: [facility] ORS;  Service: General;  Laterality: Right;      Current Outpatient Medications:  .  acetaminophen (TYLENOL) 500 MG tablet, Take 500 mg by mouth every 6 (six) hours as needed for moderate pain or headache., Disp: , Rfl:  .  azithromycin (ZITHROMAX) 250 MG tablet, 2 tabs (500 mg) x 1 day, then 1 tab (250 mg) x 4 days., Disp: 6 tablet, Rfl: 0 .  Chlorphen-Pseudoephed-APAP (CORICIDIN D PO), Take by mouth., Disp: , Rfl:  .  omeprazole (PRILOSEC OTC) 20 MG tablet, Take 20 mg by mouth daily as needed (for acid reflux)., Disp: , Rfl:  .  venlafaxine XR (EFFEXOR-XR) 37.5 MG 24 hr capsule,  Take 1 capsule (37.5 mg total) by mouth daily with breakfast., Disp: 30 capsule, Rfl: 2 No current facility-administered medications for this visit.   Facility-Administered Medications Ordered in Other Visits:  .  sodium chloride flush (NS) 0.9 % injection 10 mL, 10 mL, Intravenous, PRN, Mike Gip, Melissa C, MD, 10 mL at 04/17/18 0844   Objective:   Vitals:   05/14/18 1537  BP: 125/82  Pulse: 89  Resp: 12  SpO2: 100%    Physical Exam  Constitutional: She is oriented to person, place, and time. She appears well-developed and well-nourished.  HENT:  Head: Normocephalic and atraumatic.  Eyes: Pupils are equal, round, and reactive to light. EOM are normal.  Cardiovascular: Normal rate.   Pulmonary/Chest: Effort normal.  Abdominal: Soft. She exhibits no distension. There is no tenderness.  Neurological: She is alert and oriented to person, place, and time.  Skin: Skin is warm.  Psychiatric: She has a normal mood and affect. Her behavior is normal. Judgment and thought content normal.    Assessment & Plan:  Malignant neoplasm of upper-outer quadrant of left breast in female, estrogen receptor positive ([place])  Plan is for a left immediate breast reconstruction with expander and Flex HD. The risks that can be encountered with and after placement of a breast expander placement were discussed and include the following but not limited to these: bleeding, infection, delayed healing, anesthesia risks, skin sensation changes, injury to structures including nerves, blood vessels, and muscles which may be temporary or permanent, allergies to tape, suture materials and glues, blood products, topical preparations or injected agents, skin contour irregularities, skin discoloration and swelling, deep vein thrombosis, cardiac and pulmonary complications, pain, which may persist, fluid accumulation, wrinkling of the skin over the expander, changes in nipple or breast sensation, expander leakage or rupture, faulty position of the expander, persistent pain, formation of tight scar tissue around the expander (capsular contracture), possible need for revisional surgery or staged procedures.   [place], DO

## 2018-05-17 ENCOUNTER — Encounter: Payer: Self-pay | Admitting: General Surgery

## 2018-05-19 ENCOUNTER — Encounter: Payer: Self-pay | Admitting: Plastic Surgery

## 2018-05-19 MED ORDER — DIAZEPAM 2 MG PO TABS: 2.0000 mg | ORAL_TABLET | Freq: Three times a day (TID) | ORAL | 0 refills | Status: AC | PRN

## 2018-05-19 MED ORDER — ONDANSETRON HCL 4 MG PO TABS: 4.0000 mg | ORAL_TABLET | Freq: Three times a day (TID) | ORAL | 0 refills | Status: AC | PRN

## 2018-05-19 MED ORDER — HYDROCODONE-ACETAMINOPHEN 5-325 MG PO TABS: 1.0000 | ORAL_TABLET | Freq: Three times a day (TID) | ORAL | 0 refills | Status: DC | PRN

## 2018-05-19 MED ORDER — CEPHALEXIN 500 MG PO CAPS: 500.0000 mg | ORAL_CAPSULE | Freq: Four times a day (QID) | ORAL | 0 refills | Status: AC

## 2018-05-20 ENCOUNTER — Observation Stay
Admission: RE | Admit: 2018-05-20 | Discharge: 2018-05-21 | Disposition: A | Discharge status: Home / Self Care | Payer: BC Managed Care – PPO | Source: Ambulatory Visit | Attending: General Surgery | Admitting: General Surgery

## 2018-05-20 ENCOUNTER — Other Ambulatory Visit: Payer: Self-pay

## 2018-05-20 ENCOUNTER — Encounter
Admission: RE | Disposition: A | Discharge status: Home / Self Care | Payer: Self-pay | Source: Ambulatory Visit | Attending: General Surgery

## 2018-05-20 ENCOUNTER — Ambulatory Visit: Payer: BC Managed Care – PPO | Admitting: Registered Nurse

## 2018-05-20 ENCOUNTER — Encounter
Admission: RE | Admit: 2018-05-20 | Discharge: 2018-05-20 | Disposition: A | Discharge status: Home / Self Care | Payer: BC Managed Care – PPO | Source: Ambulatory Visit | Attending: General Surgery | Admitting: General Surgery

## 2018-05-20 ENCOUNTER — Ambulatory Visit: Payer: BC Managed Care – PPO

## 2018-05-20 DIAGNOSIS — Z170 Estrogen receptor positive status [ER+]: Principal | ICD-10-CM

## 2018-05-20 DIAGNOSIS — Z803 Family history of malignant neoplasm of breast: Secondary | ICD-10-CM | POA: Insufficient documentation

## 2018-05-20 DIAGNOSIS — C50412 Malignant neoplasm of upper-outer quadrant of left female breast: Principal | ICD-10-CM | POA: Insufficient documentation

## 2018-05-20 DIAGNOSIS — C773 Secondary and unspecified malignant neoplasm of axilla and upper limb lymph nodes: Secondary | ICD-10-CM | POA: Diagnosis not present

## 2018-05-20 DIAGNOSIS — C50212 Malignant neoplasm of upper-inner quadrant of left female breast: Secondary | ICD-10-CM | POA: Diagnosis not present

## 2018-05-20 DIAGNOSIS — Z79899 Other long term (current) drug therapy: Secondary | ICD-10-CM | POA: Insufficient documentation

## 2018-05-20 DIAGNOSIS — Z8041 Family history of malignant neoplasm of ovary: Secondary | ICD-10-CM | POA: Insufficient documentation

## 2018-05-20 DIAGNOSIS — C50919 Malignant neoplasm of unspecified site of unspecified female breast: Secondary | ICD-10-CM | POA: Diagnosis present

## 2018-05-20 DIAGNOSIS — C50912 Malignant neoplasm of unspecified site of left female breast: Secondary | ICD-10-CM

## 2018-05-20 HISTORY — PX: BREAST RECONSTRUCTION WITH PLACEMENT OF TISSUE EXPANDER AND FLEX HD (ACELLULAR HYDRATED DERMIS): SHX6295

## 2018-05-20 HISTORY — PX: MASTECTOMY: SHX3

## 2018-05-20 HISTORY — PX: MASTECTOMY W/ SENTINEL NODE BIOPSY: SHX2001

## 2018-05-20 LAB — POCT PREGNANCY, URINE: PREG TEST UR: NEGATIVE

## 2018-05-20 SURGERY — MASTECTOMY WITH SENTINEL LYMPH NODE BIOPSY
Anesthesia: General | Laterality: Left

## 2018-05-20 MED ORDER — POLYETHYLENE GLYCOL 3350 17 G PO PACK
17.0000 g | PACK | Freq: Every day | ORAL | Status: DC | PRN
  Filled 2018-05-20: qty 1

## 2018-05-20 MED ORDER — KETOROLAC TROMETHAMINE 30 MG/ML IJ SOLN
INTRAMUSCULAR | Status: DC | PRN
  Administered 2018-05-20: 30 mg via INTRAVENOUS

## 2018-05-20 MED ORDER — HYDROMORPHONE HCL 1 MG/ML IJ SOLN: 1.0000 mg | INTRAMUSCULAR | Status: DC | PRN

## 2018-05-20 MED ORDER — BUPIVACAINE-EPINEPHRINE (PF) 0.25% -1:200000 IJ SOLN
INTRAMUSCULAR | Status: AC
  Filled 2018-05-20: qty 30

## 2018-05-20 MED ORDER — VENLAFAXINE HCL ER 37.5 MG PO CP24
37.5000 mg | ORAL_CAPSULE | Freq: Every day | ORAL | Status: DC
  Administered 2018-05-21: 37.5 mg via ORAL
  Filled 2018-05-20: qty 1

## 2018-05-20 MED ORDER — DEXAMETHASONE SODIUM PHOSPHATE 10 MG/ML IJ SOLN
INTRAMUSCULAR | Status: DC | PRN
  Administered 2018-05-20: 5 mg via INTRAVENOUS

## 2018-05-20 MED ORDER — LIDOCAINE HCL (CARDIAC) PF 100 MG/5ML IV SOSY
PREFILLED_SYRINGE | INTRAVENOUS | Status: DC | PRN
  Administered 2018-05-20: 60 mg via INTRAVENOUS

## 2018-05-20 MED ORDER — PANTOPRAZOLE SODIUM 40 MG PO TBEC: 40.0000 mg | DELAYED_RELEASE_TABLET | Freq: Every day | ORAL | Status: DC | PRN

## 2018-05-20 MED ORDER — IBUPROFEN 400 MG PO TABS: 400.0000 mg | ORAL_TABLET | ORAL | Status: DC | PRN

## 2018-05-20 MED ORDER — KETOROLAC TROMETHAMINE 30 MG/ML IJ SOLN
INTRAMUSCULAR | Status: AC
  Filled 2018-05-20: qty 1

## 2018-05-20 MED ORDER — CEFAZOLIN SODIUM-DEXTROSE 2-4 GM/100ML-% IV SOLN: 2.0000 g | INTRAVENOUS | Status: DC

## 2018-05-20 MED ORDER — NAPROXEN 500 MG PO TABS
500.0000 mg | ORAL_TABLET | Freq: Two times a day (BID) | ORAL | Status: DC | PRN
  Filled 2018-05-20: qty 1

## 2018-05-20 MED ORDER — CELECOXIB 200 MG PO CAPS
200.0000 mg | ORAL_CAPSULE | ORAL | Status: AC
  Administered 2018-05-20: 200 mg via ORAL

## 2018-05-20 MED ORDER — ONDANSETRON HCL 4 MG/2ML IJ SOLN
INTRAMUSCULAR | Status: AC
  Filled 2018-05-20: qty 2

## 2018-05-20 MED ORDER — FENTANYL CITRATE (PF) 100 MCG/2ML IJ SOLN
INTRAMUSCULAR | Status: AC
  Filled 2018-05-20: qty 2

## 2018-05-20 MED ORDER — DEXAMETHASONE SODIUM PHOSPHATE 10 MG/ML IJ SOLN
INTRAMUSCULAR | Status: AC
  Filled 2018-05-20: qty 1

## 2018-05-20 MED ORDER — ACETAMINOPHEN 10 MG/ML IV SOLN
INTRAVENOUS | Status: AC
  Filled 2018-05-20: qty 100

## 2018-05-20 MED ORDER — LIDOCAINE HCL URETHRAL/MUCOSAL 2 % EX GEL
CUTANEOUS | Status: AC
  Filled 2018-05-20: qty 5

## 2018-05-20 MED ORDER — SODIUM CHLORIDE (PF) 0.9 % IJ SOLN
INTRAMUSCULAR | Status: AC
  Filled 2018-05-20: qty 10

## 2018-05-20 MED ORDER — TECHNETIUM TC 99M SULFUR COLLOID FILTERED
0.7600 | Freq: Once | INTRAVENOUS | Status: AC | PRN
  Administered 2018-05-20: 0.76 via INTRADERMAL

## 2018-05-20 MED ORDER — PROPOFOL 10 MG/ML IV BOLUS
INTRAVENOUS | Status: AC
  Filled 2018-05-20: qty 20

## 2018-05-20 MED ORDER — ONDANSETRON HCL 4 MG/2ML IJ SOLN
INTRAMUSCULAR | Status: DC | PRN
  Administered 2018-05-20: 4 mg via INTRAVENOUS

## 2018-05-20 MED ORDER — PROMETHAZINE HCL 25 MG/ML IJ SOLN: 6.2500 mg | INTRAMUSCULAR | Status: DC | PRN

## 2018-05-20 MED ORDER — PHENYLEPHRINE HCL 10 MG/ML IJ SOLN
INTRAMUSCULAR | Status: DC | PRN
  Administered 2018-05-20 (×2): 50 ug via INTRAVENOUS

## 2018-05-20 MED ORDER — LIDOCAINE HCL (PF) 2 % IJ SOLN
INTRAMUSCULAR | Status: AC
  Filled 2018-05-20: qty 10

## 2018-05-20 MED ORDER — MORPHINE SULFATE (PF) 2 MG/ML IV SOLN
2.0000 mg | INTRAVENOUS | Status: DC | PRN
  Administered 2018-05-20: 2 mg via INTRAVENOUS
  Filled 2018-05-20: qty 1

## 2018-05-20 MED ORDER — LACTATED RINGERS IV SOLN
INTRAVENOUS | Status: DC
  Administered 2018-05-20: 09:00:00 via INTRAVENOUS

## 2018-05-20 MED ORDER — PROPOFOL 10 MG/ML IV BOLUS
INTRAVENOUS | Status: DC | PRN
  Administered 2018-05-20: 150 mg via INTRAVENOUS

## 2018-05-20 MED ORDER — FENTANYL CITRATE (PF) 100 MCG/2ML IJ SOLN
INTRAMUSCULAR | Status: DC | PRN
  Administered 2018-05-20 (×7): 25 ug via INTRAVENOUS
  Administered 2018-05-20: 50 ug via INTRAVENOUS

## 2018-05-20 MED ORDER — ENOXAPARIN SODIUM 40 MG/0.4ML ~~LOC~~ SOLN
40.0000 mg | SUBCUTANEOUS | Status: DC
  Filled 2018-05-20: qty 0.4

## 2018-05-20 MED ORDER — HYDROCODONE-ACETAMINOPHEN 5-325 MG PO TABS
1.0000 | ORAL_TABLET | ORAL | Status: DC | PRN
  Administered 2018-05-20: 1 via ORAL
  Administered 2018-05-20: 2 via ORAL
  Filled 2018-05-20: qty 1
  Filled 2018-05-20: qty 2

## 2018-05-20 MED ORDER — CELECOXIB 200 MG PO CAPS
ORAL_CAPSULE | ORAL | Status: AC
  Filled 2018-05-20: qty 1

## 2018-05-20 MED ORDER — MIDAZOLAM HCL 2 MG/2ML IJ SOLN
INTRAMUSCULAR | Status: AC
  Filled 2018-05-20: qty 2

## 2018-05-20 MED ORDER — ACETAMINOPHEN 325 MG PO TABS
650.0000 mg | ORAL_TABLET | ORAL | Status: DC
  Administered 2018-05-20 – 2018-05-21 (×3): 650 mg via ORAL
  Filled 2018-05-20 (×4): qty 2

## 2018-05-20 MED ORDER — NEOMYCIN-POLYMYXIN B GU 40-200000 IR SOLN
Status: AC
  Filled 2018-05-20: qty 1

## 2018-05-20 MED ORDER — ACETAMINOPHEN 500 MG PO TABS: 500.0000 mg | ORAL_TABLET | Freq: Four times a day (QID) | ORAL | Status: DC

## 2018-05-20 MED ORDER — CEFAZOLIN SODIUM-DEXTROSE 2-4 GM/100ML-% IV SOLN
INTRAVENOUS | Status: AC
  Filled 2018-05-20: qty 100

## 2018-05-20 MED ORDER — GABAPENTIN 300 MG PO CAPS
ORAL_CAPSULE | ORAL | Status: AC
  Administered 2018-05-20: 300 mg via ORAL
  Filled 2018-05-20: qty 1

## 2018-05-20 MED ORDER — ONDANSETRON HCL 4 MG/2ML IJ SOLN: 4.0000 mg | Freq: Four times a day (QID) | INTRAMUSCULAR | Status: DC | PRN

## 2018-05-20 MED ORDER — SEVOFLURANE IN SOLN
RESPIRATORY_TRACT | Status: AC
  Filled 2018-05-20: qty 250

## 2018-05-20 MED ORDER — GABAPENTIN 300 MG PO CAPS
300.0000 mg | ORAL_CAPSULE | ORAL | Status: AC
  Administered 2018-05-20: 300 mg via ORAL

## 2018-05-20 MED ORDER — METHYLENE BLUE 0.5 % INJ SOLN
INTRAVENOUS | Status: AC
  Filled 2018-05-20: qty 10

## 2018-05-20 MED ORDER — MIDAZOLAM HCL 2 MG/2ML IJ SOLN
INTRAMUSCULAR | Status: DC | PRN
  Administered 2018-05-20: 2 mg via INTRAVENOUS

## 2018-05-20 MED ORDER — ONDANSETRON 4 MG PO TBDP: 4.0000 mg | ORAL_TABLET | Freq: Four times a day (QID) | ORAL | Status: DC | PRN

## 2018-05-20 MED ORDER — KCL IN DEXTROSE-NACL 20-5-0.45 MEQ/L-%-% IV SOLN
INTRAVENOUS | Status: DC
  Administered 2018-05-20 – 2018-05-21 (×2): via INTRAVENOUS
  Filled 2018-05-20 (×5): qty 1000

## 2018-05-20 MED ORDER — DIPHENHYDRAMINE HCL 12.5 MG/5ML PO ELIX
12.5000 mg | ORAL_SOLUTION | Freq: Four times a day (QID) | ORAL | Status: DC | PRN
  Filled 2018-05-20: qty 5

## 2018-05-20 MED ORDER — ONDANSETRON HCL 4 MG PO TABS: 4.0000 mg | ORAL_TABLET | Freq: Three times a day (TID) | ORAL | Status: DC | PRN

## 2018-05-20 MED ORDER — DIPHENHYDRAMINE HCL 50 MG/ML IJ SOLN: 12.5000 mg | Freq: Four times a day (QID) | INTRAMUSCULAR | Status: DC | PRN

## 2018-05-20 MED ORDER — CEFAZOLIN SODIUM-DEXTROSE 2-4 GM/100ML-% IV SOLN
2.0000 g | Freq: Three times a day (TID) | INTRAVENOUS | Status: DC
  Administered 2018-05-20 – 2018-05-21 (×3): 2 g via INTRAVENOUS
  Filled 2018-05-20 (×5): qty 100

## 2018-05-20 MED ORDER — ACETAMINOPHEN 10 MG/ML IV SOLN
INTRAVENOUS | Status: DC | PRN
  Administered 2018-05-20: 1000 mg via INTRAVENOUS

## 2018-05-20 MED ORDER — HYDROCODONE-ACETAMINOPHEN 5-325 MG PO TABS: 1.0000 | ORAL_TABLET | ORAL | Status: DC | PRN

## 2018-05-20 MED ORDER — SENNA 8.6 MG PO TABS
1.0000 | ORAL_TABLET | Freq: Two times a day (BID) | ORAL | Status: DC
  Administered 2018-05-20 – 2018-05-21 (×3): 8.6 mg via ORAL
  Filled 2018-05-20 (×3): qty 1

## 2018-05-20 MED ORDER — FAMOTIDINE 20 MG PO TABS: 20.0000 mg | ORAL_TABLET | Freq: Once | ORAL | Status: DC

## 2018-05-20 MED ORDER — OMEPRAZOLE MAGNESIUM 20 MG PO TBEC: 20.0000 mg | DELAYED_RELEASE_TABLET | Freq: Every day | ORAL | Status: DC | PRN

## 2018-05-20 MED ORDER — FENTANYL CITRATE (PF) 100 MCG/2ML IJ SOLN
INTRAMUSCULAR | Status: AC
  Administered 2018-05-20: 50 ug via INTRAVENOUS
  Filled 2018-05-20: qty 2

## 2018-05-20 MED ORDER — DIAZEPAM 2 MG PO TABS: 2.0000 mg | ORAL_TABLET | Freq: Two times a day (BID) | ORAL | Status: DC | PRN

## 2018-05-20 MED ORDER — METHYLENE BLUE 0.5 % INJ SOLN
INTRAVENOUS | Status: DC | PRN
  Administered 2018-05-20: 5 mL via SUBMUCOSAL

## 2018-05-20 MED ORDER — CEFAZOLIN SODIUM-DEXTROSE 2-4 GM/100ML-% IV SOLN
2.0000 g | INTRAVENOUS | Status: AC
  Administered 2018-05-20: 2 g via INTRAVENOUS

## 2018-05-20 MED ORDER — DIAZEPAM 2 MG PO TABS: 2.0000 mg | ORAL_TABLET | Freq: Three times a day (TID) | ORAL | Status: DC | PRN

## 2018-05-20 MED ORDER — FENTANYL CITRATE (PF) 100 MCG/2ML IJ SOLN
25.0000 ug | INTRAMUSCULAR | Status: DC | PRN
  Administered 2018-05-20 (×3): 50 ug via INTRAVENOUS

## 2018-05-20 SURGICAL SUPPLY — 92 items
APPLIER CLIP 11 MED OPEN (CLIP)
APPLIER CLIP 13 LRG OPEN (CLIP)
BAG DECANTER FOR FLEXI CONT (MISCELLANEOUS) IMPLANT
BINDER BREAST LRG (GAUZE/BANDAGES/DRESSINGS) ×3 IMPLANT
BINDER BREAST MEDIUM (GAUZE/BANDAGES/DRESSINGS) IMPLANT
BINDER BREAST XLRG (GAUZE/BANDAGES/DRESSINGS) IMPLANT
BINDER BREAST XXLRG (GAUZE/BANDAGES/DRESSINGS) IMPLANT
BIOPATCH WHT 1IN DISK W/4.0 H (GAUZE/BANDAGES/DRESSINGS) ×3 IMPLANT
BLADE BOVIE TIP EXT 4 (BLADE) IMPLANT
BLADE PHOTON ILLUMINATED (MISCELLANEOUS) ×3 IMPLANT
BLADE SURG 15 STRL LF DISP TIS (BLADE) ×1 IMPLANT
BLADE SURG 15 STRL SS (BLADE) ×2
BLADE SURG 15 STRL SS SAFETY (BLADE) ×3 IMPLANT
BNDG GAUZE 4.5X4.1 6PLY STRL (MISCELLANEOUS) IMPLANT
BULB RESERV EVAC DRAIN JP 100C (MISCELLANEOUS) IMPLANT
CANISTER SUCT 1200ML W/VALVE (MISCELLANEOUS) ×3 IMPLANT
CHLORAPREP W/TINT 26ML (MISCELLANEOUS) ×3 IMPLANT
CLIP APPLIE 11 MED OPEN (CLIP) IMPLANT
CLIP APPLIE 13 LRG OPEN (CLIP) IMPLANT
CLOSURE WOUND 1/2 X4 (GAUZE/BANDAGES/DRESSINGS)
CNTNR SPEC 2.5X3XGRAD LEK (MISCELLANEOUS) ×1
CONT SPEC 4OZ STER OR WHT (MISCELLANEOUS) ×2
CONTAINER SPEC 2.5X3XGRAD LEK (MISCELLANEOUS) ×1 IMPLANT
COVER WAND RF STERILE (DRAPES) IMPLANT
DECANTER SPIKE VIAL GLASS SM (MISCELLANEOUS) ×3 IMPLANT
DERMABOND ADVANCED (GAUZE/BANDAGES/DRESSINGS) ×2
DERMABOND ADVANCED .7 DNX12 (GAUZE/BANDAGES/DRESSINGS) ×1 IMPLANT
DRAIN CHANNEL 19F RND (DRAIN) ×3 IMPLANT
DRAIN CHANNEL JP 15F RND 16 (MISCELLANEOUS) IMPLANT
DRAPE LAPAROTOMY TRNSV 106X77 (MISCELLANEOUS) IMPLANT
DRSG GAUZE FLUFF 36X18 (GAUZE/BANDAGES/DRESSINGS) IMPLANT
DRSG TELFA 3X8 NADH (GAUZE/BANDAGES/DRESSINGS) ×3 IMPLANT
ELECT CAUTERY BLADE 6.4 (BLADE) ×3 IMPLANT
ELECT CAUTERY BLADE TIP 2.5 (TIP) ×3
ELECT REM PT RETURN 9FT ADLT (ELECTROSURGICAL) ×3
ELECTRODE CAUTERY BLDE TIP 2.5 (TIP) ×1 IMPLANT
ELECTRODE REM PT RTRN 9FT ADLT (ELECTROSURGICAL) ×1 IMPLANT
GAUZE SPONGE 4X4 12PLY STRL (GAUZE/BANDAGES/DRESSINGS) IMPLANT
GLOVE BIO SURGEON STRL SZ 6.5 (GLOVE) ×4 IMPLANT
GLOVE BIO SURGEON STRL SZ7.5 (GLOVE) ×12 IMPLANT
GLOVE BIO SURGEONS STRL SZ 6.5 (GLOVE) ×2
GLOVE INDICATOR 8.0 STRL GRN (GLOVE) ×12 IMPLANT
GOWN STRL REUS W/ TWL LRG LVL3 (GOWN DISPOSABLE) ×4 IMPLANT
GOWN STRL REUS W/TWL LRG LVL3 (GOWN DISPOSABLE) ×8
GRAFT FLEX HD 4X16 THICK (Tissue Mesh) ×3 IMPLANT
IMPL EXPANDER BREAST 375CC (Breast) ×1 IMPLANT
IMPLANT BREAST 375CC (Breast) ×2 IMPLANT
IMPLANT EXPANDER BREAST 375CC (Breast) ×1 IMPLANT
IV NS 1000ML (IV SOLUTION)
IV NS 1000ML BAXH (IV SOLUTION) IMPLANT
IV NS 500ML (IV SOLUTION) ×2
IV NS 500ML BAXH (IV SOLUTION) ×1 IMPLANT
LABEL OR SOLS (LABEL) ×3 IMPLANT
NEEDLE 21 GA WING INFUSION (NEEDLE) ×3 IMPLANT
NEEDLE HYPO 25X1 1.5 SAFETY (NEEDLE) ×3 IMPLANT
PACK BASIN MAJOR ARMC (MISCELLANEOUS) ×3 IMPLANT
PACK BASIN MINOR ARMC (MISCELLANEOUS) ×3 IMPLANT
PACK UNIVERSAL (MISCELLANEOUS) IMPLANT
PAD ABD DERMACEA PRESS 5X9 (GAUZE/BANDAGES/DRESSINGS) IMPLANT
PIN SAFETY STRL (MISCELLANEOUS) IMPLANT
RETRACTOR RING XSMALL (MISCELLANEOUS) IMPLANT
RTRCTR WOUND ALEXIS 13CM XS SH (MISCELLANEOUS)
SET ASEPTIC TRANSFER (MISCELLANEOUS) ×3 IMPLANT
SHEARS FOC LG CVD HARMONIC 17C (MISCELLANEOUS) IMPLANT
SLEVE PROBE SENORX GAMMA FIND (MISCELLANEOUS) ×3 IMPLANT
SPONGE LAP 18X18 RF (DISPOSABLE) ×6 IMPLANT
STRIP CLOSURE SKIN 1/2X4 (GAUZE/BANDAGES/DRESSINGS) IMPLANT
SUT ETHILON 3-0 FS-10 30 BLK (SUTURE) ×3
SUT MNCRL 3-0 UNDYED SH (SUTURE) ×2 IMPLANT
SUT MNCRL AB 4-0 PS2 18 (SUTURE) ×6 IMPLANT
SUT MNCRL+ 5-0 UNDYED PC-3 (SUTURE) IMPLANT
SUT MONOCRYL 3-0 UNDYED (SUTURE) ×4
SUT MONOCRYL 5-0 (SUTURE)
SUT PDS AB 1 CT1 27 (SUTURE) IMPLANT
SUT PDS PLUS 2 (SUTURE) ×4
SUT PDS PLUS AB 2-0 CT-1 (SUTURE) ×2 IMPLANT
SUT SILK 2 0 (SUTURE) ×2
SUT SILK 2-0 30XBRD TIE 12 (SUTURE) ×1 IMPLANT
SUT SILK 3 0 (SUTURE)
SUT SILK 3-0 18XBRD TIE 12 (SUTURE) IMPLANT
SUT SILK 4 0 SH (SUTURE) ×3 IMPLANT
SUT VIC AB 2-0 CT1 27 (SUTURE) ×4
SUT VIC AB 2-0 CT1 TAPERPNT 27 (SUTURE) ×2 IMPLANT
SUT VIC AB 3-0 SH 27 (SUTURE) ×4
SUT VIC AB 3-0 SH 27X BRD (SUTURE) ×2 IMPLANT
SUT VICRYL+ 3-0 144IN (SUTURE) IMPLANT
SUTURE EHLN 3-0 FS-10 30 BLK (SUTURE) ×1 IMPLANT
SWABSTK COMLB BENZOIN TINCTURE (MISCELLANEOUS) IMPLANT
SYR BULB IRRIG 60ML STRL (SYRINGE) ×3 IMPLANT
SYR CONTROL 10ML (SYRINGE) IMPLANT
TAPE TRANSPORE STRL 2 31045 (GAUZE/BANDAGES/DRESSINGS) ×3 IMPLANT
TOWEL OR 17X26 4PK STRL BLUE (TOWEL DISPOSABLE) ×3 IMPLANT

## 2018-05-20 NOTE — Op Note (Signed)
Op report    DATE OF OPERATION:  05/20/2018  LOCATION: [facility]  SURGICAL DIVISION: Plastic Surgery  PREOPERATIVE DIAGNOSES:  1. Left Breast cancer.    POSTOPERATIVE DIAGNOSES:  1. Left Breast cancer.   PROCEDURE:  1. Left immediate breast reconstruction with placement of Acellular Dermal Matrix (FlexHD) and tissue expanders.  SURGEON: Chanc Kervin Sanger Shanae Luo, DO  ANESTHESIA:  General.   COMPLICATIONS: None.   IMPLANTS: Left - Mentor 375 cc. Ref #[id number].  Serial Number [id number], 150 cc of injectable saline placed in the expander. Acellular Dermal Matrix - Flex HD 4 x 16 cm  INDICATIONS FOR PROCEDURE:  The patient, Denise [name], is a 42 y.o. female born on 05/21/1976, is here for  immediate first stage breast reconstruction with placement of left tissue expander and Acellular dermal matrix. MRN: [id number]  CONSENT:  Informed consent was obtained directly from the patient. Risks, benefits and alternatives were fully discussed. Specific risks including but not limited to bleeding, infection, hematoma, seroma, scarring, pain, implant infection, implant extrusion, capsular contracture, asymmetry, wound healing problems, and need for further surgery were all discussed. The patient did have an ample opportunity to have her questions answered to her satisfaction.   DESCRIPTION OF PROCEDURE:  The patient was taken to the operating room by the general surgery team. SCDs were placed and IV antibiotics were given. The patient's chest was prepped and draped in a sterile fashion. A time out was performed and the implants to be used were identified.  A left mastectomy was performed.  Once the general surgery team had completed their portion of the case the patient was rendered to the plastic and reconstructive surgery team.  Left:  The pectoralis major muscle was lifted from the chest wall with release of the lateral edge and lateral inframammary fold.  The pocket was  irrigated with antibiotic solution and hemostasis was achieved with electrocautery.  The ADM was then prepared according to the manufacture guidelines and slits placed to help with postoperative fluid management.  The ADM was then sutured to the inferior and lateral edge of the inframammary fold with 2-0 PDS starting with an interrupted stitch and then a running stitch.  The lateral portion was sutured to with interrupted sutures after the expander was placed.  The expander was prepared according to the manufacture guidelines, the air evacuated and then it was placed under the ADM and pectoralis major muscle.  The inferior and lateral tabs were used to secure the expander to the chest wall with 2-0 PDS.  The drain was placed at the inframammary fold over the ADM and secured to the skin with 4-0 Silk.    The deep layers were closed with 3-0 Monocryl followed by 4-0 Monocryl.  The skin was closed with 5-0 Monocryl and then dermabond was applied.  The ABDs and breast binder were placed.  The patient tolerated the procedure well and there were no complications.  The patient was allowed to wake from anesthesia and taken to the recovery room in satisfactory condition.

## 2018-05-20 NOTE — Discharge Instructions (Signed)
INSTRUCTIONS FOR AFTER BREAST SURGERY ° ° °You are getting ready to undergo breast surgery.  You will likely have some questions about what to expect following your operation.  The following information will help you and your family understand what to expect when you are discharged from the hospital.  Following these guidelines will help ensure a smooth recovery and reduce risks of complications.   °Postoperative instructions include information on: diet, wound care, medications and physical activity. ° °AFTER SURGERY °Expect to go home after the procedure.  In some cases, you may need to spend one night in the hospital for observation. ° °DIET °Breast surgery does not require a specific diet.  However, I have to mention that the healthier you eat the better your body can start healing. It is important to increasing your protein intake.  This means limiting the foods with sugar and carbohydrates.  Focus on vegetables and some meat.  If you have any liposuction during your procedure be sure to drink water.  If your urine is bright yellow, then it is concentrated, and you need to drink more water.  As a general rule after surgery, you should have 8 ounces of water every hour while awake.  If you find you are persistently nauseated or unable to take in liquids let us know.  NO TOBACCO USE or EXPOSURE.  This will slow your healing process and increase the risk of a wound. ° °WOUND CARE °You can shower the day after surgery if you don't have a drain.  Use fragrance free soap.  Dial, Dove and Ivory are usually mild on the skin. If you have a drain clean with baby wipes until the drain is removed.  If you have steri-strips / tape directly attached to your skin leave them in place. It is OK to get these wet.  No baths, pools or hot tubs for two weeks. °We close your incision to leave the smallest and best-looking scar. No ointment or creams on your incisions until given the go ahead.  Especially not Neosporin (Too many skin  reactions with this one).  A few weeks after surgery you can use Mederma and start massaging the scar. °We ask you to wear your binder or sports bra for the first 6 weeks around the clock, including while sleeping. This provides added comfort and helps reduce the fluid accumulation at the surgery site. ° °ACTIVITY °No heavy lifting until cleared by the doctor.  This usually means no more than a half-gallon of milk.  It is OK to walk and climb stairs. In fact, moving your legs is very important to decrease your risk of a blood clot.  It will also help keep you from getting deconditioned.  Every 1 to 2 hours get up and walk for 5 minutes. This will help with a quicker recovery back to normal.  Let pain be your guide so you don't do too much.  NO, you cannot do the spring cleaning and don't plan on taking care of anyone else.  This is your time for TLC.  °You will be more comfortable if you sleep and rest with your head elevated either with a few pillows under you or in a recliner.  No stomach sleeping for a few months. ° °WORK °Everyone returns to work at different times. As a rough guide, most people take at least 1 - 2 weeks off prior to returning to work. If you need documentation for your job, bring the forms to your postoperative follow   up visit. ° °DRIVING °Arrange for someone to bring you home from the hospital.  You may be able to drive a few days after surgery but not while taking any narcotics or valium. ° °BOWEL MOVEMENTS °Constipation can occur after anesthesia and while taking pain medication.  It is important to stay ahead for your comfort.  We recommend taking Milk of Magnesia (2 tablespoons; twice a day) while taking the pain pills. ° °SEROMA °This is fluid your body tried to put in the surgical site.  This is normal but if it creates tight skinny skin let us know.  It usually decreases in a few weeks. ° °WHEN TO CALL °Call your surgeon's office if any of the following occur: °• Fever 101 degrees F or  greater °• Excessive bleeding or fluid from the incision site. °• Pain that increases over time without aid from the medications °• Redness, warmth, or pus draining from incision sites °• Persistent nausea or inability to take in liquids °• Severe misshapen area that underwent the operation. ° °Here are some resources: ° °1. Plastic surgery website: https://www.plasticsurgery.org/for-medical-professionals/education-and-resources/publications/breast-reconstruction-magazine °2. Breast Reconstruction Awareness Campaign:  http://www.breastreconusa.org/ °3. Plastic surgery Implant information:  https://www.plasticsurgery.org/patient-safety/breast-implant-safety ° °

## 2018-05-20 NOTE — Interval H&P Note (Signed)
History and Physical Interval Note:  05/20/2018 10:43 AM  Denise [name]  has presented today for surgery, with the diagnosis of left breast cancer  The various methods of treatment have been discussed with the patient and family. After consideration of risks, benefits and other options for treatment, the patient has consented to  Procedure(s): MASTECTOMY WITH SENTINEL LYMPH NODE BIOPSY (Left) BREAST RECONSTRUCTION WITH PLACEMENT OF TISSUE EXPANDER AND FLEX HD (ACELLULAR HYDRATED DERMIS) (Bilateral) as a surgical intervention .  The patient's history has been reviewed, patient examined, no change in status, stable for surgery.  I have reviewed the patient's chart and labs.  Questions were answered to the patient's satisfaction.     [name] Shantae Vantol

## 2018-05-20 NOTE — Anesthesia Post-op Follow-up Note (Signed)
Anesthesia QCDR form completed.        

## 2018-05-20 NOTE — Anesthesia Procedure Notes (Signed)
Procedure Name: LMA Insertion Date/Time: 05/20/2018 10:29 AM Performed by: Hedda Slade, CRNA Pre-anesthesia Checklist: Patient identified, Patient being monitored, Timeout performed, Emergency Drugs available and Suction available Patient Re-evaluated:Patient Re-evaluated prior to induction Oxygen Delivery Method: Circle system utilized Preoxygenation: Pre-oxygenation with 100% oxygen Induction Type: IV induction Ventilation: Mask ventilation without difficulty LMA: LMA inserted LMA Size: 3.5 Tube type: Oral Number of attempts: 1 Placement Confirmation: positive ETCO2 and breath sounds checked- equal and bilateral Tube secured with: Tape Dental Injury: Teeth and Oropharynx as per pre-operative assessment

## 2018-05-20 NOTE — Anesthesia Preprocedure Evaluation (Signed)
Anesthesia Evaluation  Patient identified by MRN, date of birth, ID band Patient awake    Reviewed: Allergy & Precautions, H&P , NPO status , Patient's Chart, lab work & pertinent test results  History of Anesthesia Complications Negative for: history of anesthetic complications  Airway Mallampati: II  TM Distance: >3 FB Neck ROM: full    Dental no notable dental hx. (+) Teeth Intact   Pulmonary neg pulmonary ROS, neg shortness of breath,    Pulmonary exam normal breath sounds clear to auscultation       Cardiovascular Exercise Tolerance: Good (-) Past MI negative cardio ROS Normal cardiovascular exam Rhythm:regular Rate:Normal     Neuro/Psych negative neurological ROS  negative psych ROS   GI/Hepatic negative GI ROS, Neg liver ROS, neg GERD  ,  Endo/Other  negative endocrine ROS  Renal/GU negative Renal ROS  negative genitourinary   Musculoskeletal   Abdominal   Peds  Hematology negative hematology ROS (+)   Anesthesia Other Findings History of breast cancer.  Past Medical History: No date: Abnormal breast biopsy     Comment:  PASH, [name] No date: Family history of breast cancer No date: Family history of ovarian cancer 2000, 2016: Fibroadenoma of breast, left 08/26/2013: Genetic screening     Comment:  BRCA/BART negative/[facility]riad, CHEK2 POS 2017 2017: Increased risk of breast cancer     Comment:  IBIS=47% 06/18/2017: Left breast lump     Comment:  Fluid/drained [name] No date: Migraine 4199: Monoallelic mutation of CHEK2 gene in female patient     Comment:  increased risk of breast and colon cancer   Reproductive/Obstetrics negative OB ROS                             Anesthesia Physical  Anesthesia Plan  ASA: II  Anesthesia Plan: General   Post-op Pain Management:    Induction: Intravenous  PONV Risk Score and Plan: Ondansetron, Dexamethasone, Midazolam,  Promethazine and Treatment may vary due to age or medical condition  Airway Management Planned: LMA  Additional Equipment:   Intra-op Plan:   Post-operative Plan: Extubation in OR  Informed Consent: I have reviewed the patients History and Physical, chart, labs and discussed the procedure including the risks, benefits and alternatives for the proposed anesthesia with the patient or authorized representative who has indicated his/her understanding and acceptance.   Dental Advisory Given  Plan Discussed with: Anesthesiologist, CRNA and Surgeon  Anesthesia Plan Comments:         Anesthesia Quick Evaluation

## 2018-05-20 NOTE — Transfer of Care (Signed)
Immediate Anesthesia Transfer of Care Note  Patient: Denise [name]  Procedure(s) Performed: MASTECTOMY WITH SENTINEL LYMPH NODE BIOPSY (Left ) BREAST RECONSTRUCTION WITH PLACEMENT OF TISSUE EXPANDER AND FLEX HD (ACELLULAR HYDRATED DERMIS) (Left )  Patient Location: PACU  Anesthesia Type:General  Level of Consciousness: sedated  Airway & Oxygen Therapy: Patient Spontanous Breathing and Patient connected to face mask oxygen  Post-op Assessment: Report given to RN and Post -op Vital signs reviewed and stable  Post vital signs: Reviewed and stable  Last Vitals:  Vitals Value Taken Time  BP 123/70 05/20/2018  1:14 PM  Temp    Pulse 98 05/20/2018  1:14 PM  Resp 13 05/20/2018  1:14 PM  SpO2 96 % 05/20/2018  1:14 PM  Vitals shown include unvalidated device data.  Last Pain:  Vitals:   05/20/18 1313  TempSrc:   PainSc: 0-No pain         Complications: No apparent anesthesia complications

## 2018-05-20 NOTE — H&P (Signed)
No interval change in clinical exam or history. For left mastectomy with immediate reconstruction.

## 2018-05-20 NOTE — Op Note (Signed)
Preoperative diagnosis: Left breast cancer.  Status post neoadjuvant treatment.  Desire for immediate reconstruction.  Postoperative diagnosis: Same.  Operative procedure: Left simple mastectomy with sentinel node biopsy.  Hervey Ard, MD); left breast tissue expander placement Audelia Hives, DO)  Operating Surgeon: Hervey Ard, MD; Audelia Hives, DO.  Assistant: Arvilla Meres, RNFA.  Anesthesia: General by LMA.  Estimated blood loss: 50 cc.  Clinical note: This 42 year old woman developed a her to positive breast cancer with a positive node on preoperative biopsy.  She underwent neoadjuvant chemotherapy.  She showed a tremendous response in the breast with a less impressive response in the lymph gland.  She is brought to the operative at this time for planned mastectomy due to tumor size and node biopsy with possibility of axillary dissection.  She had SCD stockings for DVT prevention.  She received Ancef prior to the procedure.  Operative note: With the patient under adequate general anesthesia 5 cc of 0.5% methylene blue was injected in subareolar plexus.  The left breast chest and axilla was then cleansed with ChloraPrep and draped.  Ultrasound was used to confirm the location of the previously identified lymph node at the level of the sternal notch adjacent to the posterior chest wall.  The breast flaps are outlined maintaining about a 5-6 mm edge from the areola.  The skin was incised sharply and the remaining dissection completed with the photon blade.  Flaps were maintained at about 5 mm in thickness.  The superior flap was elevated in the axillary envelope entered.  A large hot node with counts of 14,000 was identified as well as a large non-hot lymph node immediately adjacent to 1 out of the.  These were sent as sentinel nodes 1 and 2.  The first hot, blue node showed the previously placed biopsy clip within it.  Touch preps due to the fatty replacement of both nodes showed  a few atypical cells but no evidence of clear-cut malignancy.  Permanent sections deferred.  A third small 6 mm normal sized lymph node was identified in the axillary tail that had counts of 1400.  This was sent in formalin for routine histology.  Once the flaps were elevated the breast was removed from the underlying pectoralis muscle taking the fascia that muscle with the specimen.  Hemostasis here was with 3-0 Vicryl ties for the perforating vessels.  With the breast removed and sentinel nodes removed the procedure was turned over to [facility], DO who will dictate the reconstructive portion of the procedure.

## 2018-05-21 ENCOUNTER — Encounter: Payer: Self-pay | Admitting: General Surgery

## 2018-05-21 DIAGNOSIS — C50412 Malignant neoplasm of upper-outer quadrant of left female breast: Secondary | ICD-10-CM | POA: Diagnosis not present

## 2018-05-21 LAB — HIV ANTIBODY (ROUTINE TESTING W REFLEX): HIV Screen 4th Generation wRfx: NONREACTIVE

## 2018-05-21 NOTE — Care Management (Signed)
During progression there were no discharge needs identified by members of the care team.

## 2018-05-21 NOTE — Discharge Summary (Signed)
Physician Discharge Summary  Patient ID: Denise [name] MRN: [id number] DOB/AGE: 40/06/1975 40 y.o.  Admit date: 05/20/2018 Discharge date: 05/21/2018  Admission Diagnoses: Breast cancer  Discharge Diagnoses:  Active Problems:   Breast cancer [facility])   Discharged Condition: good  Hospital Course: Minimal pain. Tolerating diet.   Consults: None  Significant Diagnostic Studies: pathology pending.   Treatments: surgery: Mastectomy with expander inplantation.  Discharge Exam: Blood pressure 116/66, pulse 87, temperature 98 F (36.7 C), temperature source Oral, resp. rate 18, height 5\' 7"  (1.702 m), weight 83 kg, last menstrual period 11/28/2017, SpO2 100 %. Flaps healthy.   Disposition: Discharge disposition: 01-Home or [place], DO In 1 week.   Specialty:  Plastic Surgery Contact information: [address] [place] [address] [phone]        [name], MD In 2 weeks.   Specialties:  General Surgery, Radiology Contact information: [address] [place] [address] [phone]           Signed: [name] 05/21/2018, 8:09 AM

## 2018-05-21 NOTE — Progress Notes (Signed)
Miquel Dunn Ramos  A and O x 4. VSS. Pt tolerating diet well. No complaints of pain or nausea. IV removed intact, no new prescriptions given. RN provided education to pt how to empty JP drain. Also provided pt with supplies. Pt voiced understanding of discharge instructions with no further questions. Pt discharged via wheelchair with axillary.    Allergies as of 05/21/2018      Reactions   Steri-strip Compound Benzoin [benzoin Compound]    Steri-strip ,    Tape Other (See Comments)   Minor skin irritation      Medication List    STOP taking these medications   azithromycin 250 MG tablet Commonly known as:  ZITHROMAX     TAKE these medications   acetaminophen 500 MG tablet Commonly known as:  TYLENOL Take 500 mg by mouth every 6 (six) hours as needed for moderate pain or headache.   cephALEXin 500 MG capsule Commonly known as:  KEFLEX Take 1 capsule (500 mg total) by mouth 4 (four) times daily for 5 days.   CORICIDIN D PO Take by mouth.   diazepam 2 MG tablet Commonly known as:  VALIUM Take 1 tablet (2 mg total) by mouth every 8 (eight) hours as needed for up to 10 days for muscle spasms.   HYDROcodone-acetaminophen 5-325 MG tablet Commonly known as:  NORCO/VICODIN Take 1 tablet by mouth every 8 (eight) hours as needed for moderate pain.   omeprazole 20 MG tablet Commonly known as:  PRILOSEC OTC Take 20 mg by mouth daily as needed (for acid reflux).   ondansetron 4 MG tablet Commonly known as:  ZOFRAN Take 1 tablet (4 mg total) by mouth every 8 (eight) hours as needed for up to 5 days for nausea or vomiting.   venlafaxine XR 37.5 MG 24 hr capsule Commonly known as:  EFFEXOR-XR Take 1 capsule (37.5 mg total) by mouth daily with breakfast.       Vitals:   05/21/18 0013 05/21/18 0504  BP: 114/82 116/66  Pulse: 94 87  Resp: 16 18  Temp: 98.2 F (36.8 C) 98 F (36.7 C)  SpO2: 96% 100%    Francesco Sor

## 2018-05-21 NOTE — Anesthesia Postprocedure Evaluation (Signed)
Anesthesia Post Note  Patient: Denise [name]  Procedure(s) Performed: MASTECTOMY WITH SENTINEL LYMPH NODE BIOPSY (Left ) BREAST RECONSTRUCTION WITH PLACEMENT OF TISSUE EXPANDER AND FLEX HD (ACELLULAR HYDRATED DERMIS) (Left )  Patient location during evaluation: PACU Anesthesia Type: General Level of consciousness: awake and alert Pain management: pain level controlled Vital Signs Assessment: post-procedure vital signs reviewed and stable Respiratory status: spontaneous breathing, nonlabored ventilation, respiratory function stable and patient connected to nasal cannula oxygen Cardiovascular status: blood pressure returned to baseline and stable Postop Assessment: no apparent nausea or vomiting Anesthetic complications: no     Last Vitals:  Vitals:   05/21/18 0013 05/21/18 0504  BP: 114/82 116/66  Pulse: 94 87  Resp: 16 18  Temp: 36.8 C 36.7 C  SpO2: 96% 100%    Last Pain:  Vitals:   05/21/18 0928  TempSrc:   PainSc: 6                  Martha Clan

## 2018-05-22 LAB — SURGICAL PATHOLOGY

## 2018-05-23 ENCOUNTER — Other Ambulatory Visit: Payer: Self-pay | Admitting: General Surgery

## 2018-05-28 ENCOUNTER — Ambulatory Visit (INDEPENDENT_AMBULATORY_CARE_PROVIDER_SITE_OTHER): Payer: BC Managed Care – PPO | Admitting: Plastic Surgery

## 2018-05-28 ENCOUNTER — Encounter: Payer: Self-pay | Admitting: Plastic Surgery

## 2018-05-28 VITALS — BP 123/86 | HR 113 | Resp 18 | Ht 67.0 in | Wt 183.0 lb

## 2018-05-28 DIAGNOSIS — Z170 Estrogen receptor positive status [ER+]: Secondary | ICD-10-CM

## 2018-05-28 DIAGNOSIS — Z9882 Breast implant status: Secondary | ICD-10-CM

## 2018-05-28 DIAGNOSIS — C50412 Malignant neoplasm of upper-outer quadrant of left female breast: Secondary | ICD-10-CM

## 2018-05-28 DIAGNOSIS — Z9889 Other specified postprocedural states: Secondary | ICD-10-CM | POA: Insufficient documentation

## 2018-05-28 NOTE — Progress Notes (Signed)
   Subjective:    Patient ID: Denise [name], female    DOB: 1975/10/22, 42 y.o.   MRN: [id number]  Denise [name] is a 42 year old white female here with her husband for postop follow-up after left breast reconstruction.  She underwent a left mastectomy and expander flex HD placement last week.  She did very well over the past week.  The drain output is as expected.  There does not appear to be any seroma or hematoma.  Her incision is healing well.  There is no redness or sign of infection.  She is a little tearful today but overall handling things very well.   Review of Systems  Constitutional: Negative.   HENT: Negative.   Eyes: Negative.   Respiratory: Negative.   Gastrointestinal: Negative.   Endocrine: Negative.   Genitourinary: Negative.   Musculoskeletal: Negative.   Skin: Negative.        Objective:   Physical Exam  Constitutional: She appears well-developed and well-nourished.  HENT:  Head: Normocephalic and atraumatic.  Eyes: Pupils are equal, round, and reactive to light.  Pulmonary/Chest: Effort normal.  Neurological: She is alert.  Psychiatric: She has a normal mood and affect. Her behavior is normal. Thought content normal.      Assessment & Plan:  Malignant neoplasm of upper-outer quadrant of left breast in female, estrogen receptor positive (HCC)  S/P breast reconstruction We placed injectable saline in the Expander using a sterile technique: Left: 50 cc for a total of 200 / 375 cc

## 2018-05-30 ENCOUNTER — Encounter: Payer: Self-pay | Admitting: Hematology and Oncology

## 2018-05-30 ENCOUNTER — Inpatient Hospital Stay: Payer: BC Managed Care – PPO | Attending: Hematology and Oncology

## 2018-05-30 ENCOUNTER — Inpatient Hospital Stay (HOSPITAL_BASED_OUTPATIENT_CLINIC_OR_DEPARTMENT_OTHER): Payer: BC Managed Care – PPO | Admitting: Hematology and Oncology

## 2018-05-30 ENCOUNTER — Inpatient Hospital Stay: Payer: BC Managed Care – PPO

## 2018-05-30 VITALS — BP 132/88 | HR 76 | Temp 97.9°F | Resp 18 | Wt 181.2 lb

## 2018-05-30 DIAGNOSIS — C50412 Malignant neoplasm of upper-outer quadrant of left female breast: Secondary | ICD-10-CM | POA: Insufficient documentation

## 2018-05-30 DIAGNOSIS — C773 Secondary and unspecified malignant neoplasm of axilla and upper limb lymph nodes: Secondary | ICD-10-CM | POA: Diagnosis not present

## 2018-05-30 DIAGNOSIS — Z170 Estrogen receptor positive status [ER+]: Principal | ICD-10-CM

## 2018-05-30 DIAGNOSIS — Z7189 Other specified counseling: Secondary | ICD-10-CM

## 2018-05-30 DIAGNOSIS — Z5112 Encounter for antineoplastic immunotherapy: Secondary | ICD-10-CM

## 2018-05-30 DIAGNOSIS — R945 Abnormal results of liver function studies: Secondary | ICD-10-CM | POA: Insufficient documentation

## 2018-05-30 DIAGNOSIS — R17 Unspecified jaundice: Secondary | ICD-10-CM

## 2018-05-30 DIAGNOSIS — Z9012 Acquired absence of left breast and nipple: Secondary | ICD-10-CM

## 2018-05-30 DIAGNOSIS — Z79899 Other long term (current) drug therapy: Secondary | ICD-10-CM | POA: Diagnosis not present

## 2018-05-30 LAB — CBC WITH DIFFERENTIAL/PLATELET
Abs Immature Granulocytes: 0.01 10*3/uL (ref 0.00–0.07)
Basophils Absolute: 0 10*3/uL (ref 0.0–0.1)
Basophils Relative: 1 %
Eosinophils Absolute: 0.3 10*3/uL (ref 0.0–0.5)
Eosinophils Relative: 6 %
HCT: 35.8 % — ABNORMAL LOW (ref 36.0–46.0)
Hemoglobin: 11.8 g/dL — ABNORMAL LOW (ref 12.0–15.0)
Immature Granulocytes: 0 %
Lymphocytes Relative: 32 %
Lymphs Abs: 1.6 10*3/uL (ref 0.7–4.0)
MCH: 29.9 pg (ref 26.0–34.0)
MCHC: 33 g/dL (ref 30.0–36.0)
MCV: 90.6 fL (ref 80.0–100.0)
Monocytes Absolute: 0.4 10*3/uL (ref 0.1–1.0)
Monocytes Relative: 8 %
Neutro Abs: 2.6 10*3/uL (ref 1.7–7.7)
Neutrophils Relative %: 53 %
Platelets: 212 10*3/uL (ref 150–400)
RBC: 3.95 MIL/uL (ref 3.87–5.11)
RDW: 13 % (ref 11.5–15.5)
WBC: 4.9 10*3/uL (ref 4.0–10.5)
nRBC: 0 % (ref 0.0–0.2)

## 2018-05-30 LAB — BILIRUBIN, DIRECT: Bilirubin, Direct: 0.2 mg/dL (ref 0.0–0.2)

## 2018-05-30 LAB — MAGNESIUM: Magnesium: 1.8 mg/dL (ref 1.7–2.4)

## 2018-05-30 LAB — COMPREHENSIVE METABOLIC PANEL
ALT: 43 U/L (ref 0–44)
AST: 31 U/L (ref 15–41)
Albumin: 4 g/dL (ref 3.5–5.0)
Alkaline Phosphatase: 62 U/L (ref 38–126)
Anion gap: 6 (ref 5–15)
BUN: 18 mg/dL (ref 6–20)
CO2: 26 mmol/L (ref 22–32)
Calcium: 9.2 mg/dL (ref 8.9–10.3)
Chloride: 105 mmol/L (ref 98–111)
Creatinine, Ser: 0.61 mg/dL (ref 0.44–1.00)
GFR calc Af Amer: >60 mL/min (ref >60)
GFR calc non Af Amer: >60 mL/min (ref >60)
Glucose, Bld: 119 mg/dL — ABNORMAL HIGH (ref 70–99)
Potassium: 3.7 mmol/L (ref 3.5–5.1)
Sodium: 137 mmol/L (ref 135–145)
Total Bilirubin: 1.3 mg/dL — ABNORMAL HIGH (ref 0.3–1.2)
Total Protein: 6.7 g/dL (ref 6.5–8.1)

## 2018-05-30 MED ORDER — TRASTUZUMAB CHEMO 150 MG IV SOLR
500.0000 mg | Freq: Once | INTRAVENOUS | Status: AC
  Administered 2018-05-30: 500 mg via INTRAVENOUS
  Filled 2018-05-30: qty 23.81

## 2018-05-30 MED ORDER — HEPARIN SOD (PORK) LOCK FLUSH 100 UNIT/ML IV SOLN
500.0000 [IU] | Freq: Once | INTRAVENOUS | Status: AC
  Administered 2018-05-30: 500 [IU] via INTRAVENOUS
  Filled 2018-05-30: qty 5

## 2018-05-30 MED ORDER — SODIUM CHLORIDE 0.9 % IV SOLN
420.0000 mg | Freq: Once | INTRAVENOUS | Status: AC
  Administered 2018-05-30: 420 mg via INTRAVENOUS
  Filled 2018-05-30: qty 14

## 2018-05-30 MED ORDER — ACETAMINOPHEN 325 MG PO TABS
650.0000 mg | ORAL_TABLET | Freq: Once | ORAL | Status: AC
  Administered 2018-05-30: 650 mg via ORAL
  Filled 2018-05-30: qty 2

## 2018-05-30 MED ORDER — DIPHENHYDRAMINE HCL 25 MG PO CAPS
50.0000 mg | ORAL_CAPSULE | Freq: Once | ORAL | Status: AC
  Administered 2018-05-30: 50 mg via ORAL
  Filled 2018-05-30: qty 2

## 2018-05-30 MED ORDER — SODIUM CHLORIDE 0.9 % IV SOLN
Freq: Once | INTRAVENOUS | Status: AC
  Administered 2018-05-30: 10:00:00 via INTRAVENOUS
  Filled 2018-05-30: qty 250

## 2018-05-30 MED ORDER — SODIUM CHLORIDE 0.9% FLUSH
10.0000 mL | INTRAVENOUS | Status: DC | PRN
  Administered 2018-05-30: 10 mL via INTRAVENOUS
  Filled 2018-05-30: qty 10

## 2018-05-30 NOTE — Progress Notes (Signed)
[facility] day:  05/30/18  Chief Complaint: Denise [name] is a 42 y.o. female with multi-focal Her2/neu + left breast cancer who is seen for assessment following interval surgery and continuation of Herceptin and Perjeta.   HPI: The patient was last seen in the medical oncology clinic on 05/09/2018.  At that time, she had bronchitis.  Vasomotor symptoms had resolved on venlafaxine.  She received Hereptin and Perjeta.  She received azithromycin.  She underwent mastectomy with sentinel lymph node biopsy on 05/20/2018 by [name].  Pathology revealed no residual carcinoma in the breast.  One of three sentinel lymph nodes were positive.  One lymph node had 2 metastatic foci (2.1 mm and 1 mm).  Pathologic stage revealed ypT0 ypN1a.  During the interim, patient has done well following her mastectomy. She denies any significant complications. She has been seen in post-operative consult by surgery and advised that she was progressing as expected.   During her last visit, patient complained of URI symptoms. She was prescribed a course of azithromycin. She notes complete resolution symptoms prior to her surgery.   Patient denies that she has experienced any B symptoms. She denies any interval infections. Patient advises that she maintains an adequate appetite. She is eating well. Weight today is 181 lb 3 oz (82.2 kg), which compared to her last visit to the clinic, represents a 4 pound decrease.    Patient denies pain in the clinic today.   Past Medical History:  Diagnosis Date  . Abnormal breast biopsy    [facility], [name]  . Family history of breast cancer   . Family history of ovarian cancer   . Fibroadenoma of breast, left 2000, 2016  . Genetic screening 08/26/2013   BRCA/BART negative/[facility], CHEK2 POS 2017  . Increased risk of breast cancer 2017   IBIS=47%  . Left breast lump 06/18/2017   Fluid/drained [name]  . Migraine   .  Monoallelic mutation of CHEK2 gene in female patient 2017   increased risk of breast and colon cancer    Past Surgical History:  Procedure Laterality Date  . BREAST BIOPSY Bilateral 09/08/14   neg  . BREAST BIOPSY Left 03/16/2015   Procedure: BREAST BIOPSY WITH NEEDLE LOCALIZATION;  Surgeon: [name], MD;  Location: [facility] ORS;  Service: General;  Laterality: Left;  . BREAST CYST ASPIRATION Left 06/18/2017   cyst aspiration  . BREAST EXCISIONAL BIOPSY Left 09/2014  . BREAST EXCISIONAL BIOPSY Right    age 44's  . BREAST RECONSTRUCTION WITH PLACEMENT OF TISSUE EXPANDER AND FLEX HD (ACELLULAR HYDRATED DERMIS) Left 05/20/2018   Procedure: BREAST RECONSTRUCTION WITH PLACEMENT OF TISSUE EXPANDER AND FLEX HD (ACELLULAR HYDRATED DERMIS);  Surgeon: [name], DO;  Location: [facility] ORS;  Service: Plastics;  Laterality: Left;  . BREAST SURGERY Right March 2000   benign fibroadenoma  . BREAST SURGERY Left 08/08/13   excision  . BREAST SURGERY Left 09/23/14   excision  . MASTECTOMY W/ SENTINEL NODE BIOPSY Left 05/20/2018   Procedure: MASTECTOMY WITH SENTINEL LYMPH NODE BIOPSY;  Surgeon: [name], MD;  Location: [facility] ORS;  Service: General;  Laterality: Left;  . PORTACATH PLACEMENT Right 12/17/2017   Procedure: INSERTION PORT-A-CATH;  Surgeon: [name], MD;  Location: [facility] ORS;  Service: General;  Laterality: Right;    Family History  Problem Relation Age of Onset  . Prostate cancer Father 38  . Breast cancer Paternal Grandmother 7  .  Breast cancer Maternal Aunt 60  . Ovarian cancer Maternal Aunt 70  . Breast cancer Paternal Aunt 85  . Pancreatic cancer Maternal Aunt 75    Social History:  reports that she has never smoked. She has never used smokeless tobacco. She reports current alcohol use. She reports that she does not use drugs.  She does not smoke. She "occasionally" drinks alcohol. 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 Patient  denies known exposures to radiation on toxins. Her husband's name is [name]. She has 2 daughters, [name] and [name] (ages 60 1/2 and 95).  Her husband's name is [name].  The patient is accompanied by her mother today.  Allergies:  Allergies  Allergen Reactions  . Steri-Strip Compound Benzoin [Benzoin Compound]     Steri-strip ,   . Tape Other (See Comments)    Minor skin irritation     Current Medications: Current Outpatient Medications  Medication Sig Dispense Refill  . diazepam (VALIUM) 2 MG tablet Take 2 mg by mouth every 6 (six) hours as needed for anxiety.    Marland Kitchen venlafaxine XR (EFFEXOR-XR) 37.5 MG 24 hr capsule Take 1 capsule (37.5 mg total) by mouth daily with breakfast. 30 capsule 2  . acetaminophen (TYLENOL) 500 MG tablet Take 500 mg by mouth every 6 (six) hours as needed for moderate pain or headache.    . Chlorphen-Pseudoephed-APAP (CORICIDIN D PO) Take by mouth.    Marland Kitchen HYDROcodone-acetaminophen (NORCO) 5-325 MG tablet Take 1 tablet by mouth every 8 (eight) hours as needed for moderate pain. (Patient not taking: Reported on 05/30/2018) 20 tablet 0  . omeprazole (PRILOSEC OTC) 20 MG tablet Take 20 mg by mouth daily as needed (for acid reflux).     No current facility-administered medications for this visit.    Facility-Administered Medications Ordered in Other Visits  Medication Dose Route Frequency Provider Last Rate Last Dose  . heparin lock flush 100 unit/mL  500 Units Intravenous Once Corcoran, Melissa C, MD      . sodium chloride flush (NS) 0.9 % injection 10 mL  10 mL Intravenous PRN Lequita Asal, MD   10 mL at 04/17/18 0844  . sodium chloride flush (NS) 0.9 % injection 10 mL  10 mL Intravenous PRN Lequita Asal, MD   10 mL at 05/30/18 3664    Review of Systems  Constitutional: Positive for weight loss (4 pounds). Negative for chills, diaphoresis, fever and malaise/fatigue.       Feels "much better".  HENT: Negative.  Negative for congestion, ear discharge, ear  pain, nosebleeds, sinus pain, sore throat and tinnitus.   Eyes: Negative.  Negative for blurred vision, double vision, photophobia, pain, discharge and redness.  Respiratory: Negative.  Negative for cough, hemoptysis, sputum production, shortness of breath and wheezing.   Cardiovascular: Negative.  Negative for chest pain, palpitations, orthopnea, leg swelling and PND.  Gastrointestinal: Negative for abdominal pain, blood in stool, constipation, diarrhea, melena, nausea and vomiting.       Bowels stable.  Genitourinary: Negative.  Negative for dysuria, frequency, hematuria and urgency.  Musculoskeletal: Negative.  Negative for back pain, falls, joint pain, myalgias and neck pain.  Skin: Negative.  Negative for itching and rash.       PowerPort right chest wall.  Neurological: Negative for dizziness, tingling, tremors, sensory change, speech change, focal weakness, weakness and headaches.  Endo/Heme/Allergies: Does not bruise/bleed easily.       Diabetes - on metformin. Vasomotor symptoms on Effexor.  Psychiatric/Behavioral: Negative for depression, memory loss and suicidal ideas. The patient is not nervous/anxious and does not have insomnia (improved).   All other systems reviewed and are negative.  Performance status (ECOG): 1 - Symptomatic but completely ambulatory  Vital Signs BP 132/88 (BP Location: Right Arm, Patient Position: Sitting)   Pulse 76   Temp 97.9 F (36.6 C) (Tympanic)   Resp 18   Wt 181 lb 3 oz (82.2 kg)   BMI 28.38 kg/m   Physical Exam  Constitutional: She is oriented to person, place, and time and well-developed, well-nourished, and in no distress. No distress.  HENT:  Head: Normocephalic and atraumatic. 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  Mouth/Throat: Oropharynx is clear and moist and mucous membranes are normal. No oropharyngeal exudate.  Eyes: Pupils are equal, round, and reactive to light. Conjunctivae and EOM are normal. No scleral icterus.   Neck: Normal range of motion. Neck supple. No JVD present.  Cardiovascular: Normal rate, regular rhythm, normal heart sounds and intact distal pulses. Exam reveals no gallop and no friction rub.  No murmur heard. Pulmonary/Chest: Effort normal and breath sounds normal. No respiratory distress. She has no wheezes. She has no rales. Right breast exhibits no mass, no nipple discharge, no skin change and no tenderness. Breasts are asymmetrical (left breast s/p mastectomy with reconstruction.  Drain in place.).  PowerPort to right chest wall.  Abdominal: Soft. Bowel sounds are normal. She exhibits no distension and no mass. There is no abdominal tenderness. There is no rebound and no guarding.  Musculoskeletal: Normal range of motion.        General: No tenderness or edema.  Lymphadenopathy:    She has no cervical adenopathy.    She has no axillary adenopathy.       Right: No supraclavicular adenopathy present.       Left: No supraclavicular adenopathy present.  Neurological: She is alert and oriented to person, place, and time. Gait normal.  Skin: Skin is warm and dry. No rash noted. She is not diaphoretic. No erythema.  Psychiatric: Mood, affect and judgment normal.  Nursing note and vitals reviewed.   Infusion on 05/30/2018  Component Date Value Ref Range Status  . Magnesium 05/30/2018 1.8  1.7 - 2.4 mg/dL Final   Performed at [facility], [address] [place], [place] [address]  . Sodium 05/30/2018 137  135 - 145 mmol/L Final  . Potassium 05/30/2018 3.7  3.5 - 5.1 mmol/L Final  . Chloride 05/30/2018 105  98 - 111 mmol/L Final  . CO2 05/30/2018 26  22 - 32 mmol/L Final  . Glucose, Bld 05/30/2018 119* 70 - 99 mg/dL Final  . BUN 05/30/2018 18  6 - 20 mg/dL Final  . Creatinine, Ser 05/30/2018 0.61  0.44 - 1.00 mg/dL Final  . Calcium 05/30/2018 9.2  8.9 - 10.3 mg/dL Final  . Total Protein 05/30/2018 6.7  6.5 - 8.1 g/dL Final  . Albumin 05/30/2018 4.0  3.5 - 5.0 g/dL Final  .  AST 05/30/2018 31  15 - 41 U/L Final  . ALT 05/30/2018 43  0 - 44 U/L Final  . Alkaline Phosphatase 05/30/2018 62  38 - 126 U/L Final  . Total Bilirubin 05/30/2018 1.3* 0.3 - 1.2 mg/dL Final  . GFR calc non Af Amer 05/30/2018 >60  >60 mL/min Final  . GFR calc Af Amer 05/30/2018 >60  >60 mL/min Final  . Anion gap 05/30/2018 6  5 - 15 Final   Performed at  [facility], [address], [place], [place] [address]  . WBC 05/30/2018 4.9  4.0 - 10.5 K/uL Final  . RBC 05/30/2018 3.95  3.87 - 5.11 MIL/uL Final  . Hemoglobin 05/30/2018 11.8* 12.0 - 15.0 g/dL Final  . HCT 05/30/2018 35.8* 36.0 - 46.0 % Final  . MCV 05/30/2018 90.6  80.0 - 100.0 fL Final  . MCH 05/30/2018 29.9  26.0 - 34.0 pg Final  . MCHC 05/30/2018 33.0  30.0 - 36.0 g/dL Final  . RDW 05/30/2018 13.0  11.5 - 15.5 % Final  . Platelets 05/30/2018 212  150 - 400 K/uL Final  . nRBC 05/30/2018 0.0  0.0 - 0.2 % Final  . Neutrophils Relative % 05/30/2018 53  % Final  . Neutro Abs 05/30/2018 2.6  1.7 - 7.7 K/uL Final  . Lymphocytes Relative 05/30/2018 32  % Final  . Lymphs Abs 05/30/2018 1.6  0.7 - 4.0 K/uL Final  . Monocytes Relative 05/30/2018 8  % Final  . Monocytes Absolute 05/30/2018 0.4  0.1 - 1.0 K/uL Final  . Eosinophils Relative 05/30/2018 6  % Final  . Eosinophils Absolute 05/30/2018 0.3  0.0 - 0.5 K/uL Final  . Basophils Relative 05/30/2018 1  % Final  . Basophils Absolute 05/30/2018 0.0  0.0 - 0.1 K/uL Final  . Immature Granulocytes 05/30/2018 0  % Final  . Abs Immature Granulocytes 05/30/2018 0.01  0.00 - 0.07 K/uL Final   Performed at [facility], [address] [place], [place] [address]    Assessment:  Denise [name] is a 42 y.o. 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  Pathology revealed no residual carcinoma in the breast.  One of three sentinel lymph nodes were positive.  One  lymph node had 2 metastatic foci (2.1 mm and 1 mm).  Pathologic stage revealed ypT0 ypN1a.  Index breast mass and axillary node biopsy on 12/06/2017 revealed grade II invasive ductal carcinoma with calcifications at the 11 o'clock position.  There was metastatic carcinoma in 1 of 1 lymph nodes.  Tumor was ER + (100%), PR + (100%), and Ki67 5%.  Her2/neu was 3+ by IHC (heterogeneous) and FISH +.  Diagnostic left mammogram and ultrasound on 12/06/2017 revealed a 1.7 x 1.3 x 1.3 cm irregular hypoechoic mass with internal calcifications at the 11 o'clock position 2 cm from the nipple with internal calcifications. There was a similar appearing 0.9 x 0.8 x 0.7 cm mass at the 11 o'clock position 4 cm from the nipple (2.4 cm from the index lesion).  There were suspicious, segmental calcifications originating from the index lesion and extending 5 cm posteriorly.  There was a 0.6 cm morphologically abnormal left axillary lymph node.  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 The adjacent and more superiorly located 11 o'clock mass (7 x 12 mm) seen on recent ultrasound, 4 cm from the nipple on ultrasound, was also identified. There were 2 probable satellite lesions (7 mm, medial lesion; posterior and lateral lesion). The total span of malignancy was 3 x 3.1 x 4.5 cm.  There were calcifications extending up to 5 cm posterior to the biopsied malignancy which were highly suspicious on mammography but not appreciated.  There was a mass in the lower outer left breast measured 5 mm,  representing a change.  The known metastatic node in the left axilla was identified. A node along the posterolateral margin of the pectoralis minor (cortex 5 mm) was nonspecific but at least somewhat suspicious.  Chest, abdomen, and pelvic CT on 12/19/2017 revealed a solitary enlarged biopsy-proven metastatic left axillary  lymph node.  There were no additional findings suspicious for metastatic disease in the chest, abdomen or pelvis.  There was a nonspecific tiny sclerotic upper right sacral lesion, more likely a benign bone island. Bone scintigraphy correlation versus follow-up CT could be considered.  Bone scan on 01/11/2018 was negative for metastatic pattern. No areas of focal tracer uptake. Area of concern in sacrum on CT likely represents benign bone island.   [facility] genetic testing on 08/26/2013 was negative for BRCA1 and 2. 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  She has a family history significant for breast, ovarian, pancreatic, and prostate cancer.  She received 6 cycles of TCHP chemotherapy (12/21/2017 - 04/17/2018) with Margarette Canada support. Cycle #3 was held due to elevated LFTs on 02/04/2018. She is tolerating treatment well.  Echo on 12/20/2017 revealed an EF of 60-65%.  Echo on 03/27/2018 revealed an EF of 60-65%.  She has a history of elevated LFTs.  Hepatitis B and C serologies were negative on 02/04/2018.  RUQ ultrasound on 02/06/2018 revealed a small anterior gallbladder wall polyp. There was no evidence of cholelithiasis or cholecystis. CBD measured normal at 2 mm, with no evidence of choledocholithiasis. There was normal direction of blood flow towards the liver noted.  Symptomatically, she is doing well following mastectomy.  Exam reveals post-operative changes.  Plan: 1. Labs today:  CBC with diff, CMP, Mg. 2. Multifocal stage IB left breast cancer:    Review interval pathology.    Discuss referral to radiation oncology.    Discuss NEJM study: Herceptin versus Kadcyla in patients with residual disease after neoadjuvant therapy.   Randomized trial of 1486 women with 14 cycles of T-DM1 (Kadcyla) vs trastuzumab.   Improvement in 3-year invasive DFS (88% vs 77%; hazard ratio 0.50, 95% CI 0.39-0.64)   Patients treated with neoadjuvant trastuzumab +  Perjeta:    Nonsignificant trend towards improved invasive DFS (HR 0.54, 95% CI 0.27-1.06).     Herceptin and Perjeta today. Information on Kadcyla provided.  Patient considering. Discuss symptom management.  Patient has antiemetics and pain medications at home to use on a PRN basis. Patient  advising that the  prescribed interventions are adequate at this point. Continue all medications as previously prescribed.  3. Abnormal LFTs  AST 31, ALT 43, ALP 62, and total bilirubin 1.3.  Enzymes normal.  Fractionate bilirubin. 4. Vasomotor symptoms  Improved on venlafaxine.  5.   Echo on 06/18/2018. 6.   RTC on 06/20/2018 in [place] for NP assessment, labs (CBC with diff, CMP, Mg, direct bilirubin), review of echo, and continuation of therapy (Herceptin and Perjeta OR Kadcyla).   Honor Loh, NP  05/30/18, 9:27 AM   I saw and evaluated the patient, participating in the key portions of the service and reviewing pertinent diagnostic studies and records.  I reviewed the nurse practitioner's note and agree with the findings and the plan.  The assessment and plan were discussed with the patient.  Multiple questions were asked by the patient and answered.    Nolon Stalls, MD 05/30/2018,9:27 AM

## 2018-05-30 NOTE — Progress Notes (Signed)
Pt in for follow up reports having some "muscle soreness in legs and arms".  States finished antibiotic 'over a week ago".

## 2018-05-31 ENCOUNTER — Encounter: Payer: Self-pay | Admitting: Urgent Care

## 2018-06-04 ENCOUNTER — Encounter: Payer: Self-pay | Admitting: General Surgery

## 2018-06-04 ENCOUNTER — Ambulatory Visit (INDEPENDENT_AMBULATORY_CARE_PROVIDER_SITE_OTHER): Payer: BC Managed Care – PPO | Admitting: General Surgery

## 2018-06-04 ENCOUNTER — Encounter: Payer: Self-pay | Admitting: Plastic Surgery

## 2018-06-04 ENCOUNTER — Ambulatory Visit (INDEPENDENT_AMBULATORY_CARE_PROVIDER_SITE_OTHER): Payer: BC Managed Care – PPO | Admitting: Plastic Surgery

## 2018-06-04 ENCOUNTER — Other Ambulatory Visit: Payer: Self-pay

## 2018-06-04 VITALS — BP 116/70 | HR 104 | Ht 67.0 in | Wt 181.0 lb

## 2018-06-04 VITALS — BP 144/95 | HR 111 | Temp 98.0°F | Ht 67.0 in | Wt 181.0 lb

## 2018-06-04 DIAGNOSIS — Z9012 Acquired absence of left breast and nipple: Secondary | ICD-10-CM

## 2018-06-04 DIAGNOSIS — C50212 Malignant neoplasm of upper-inner quadrant of left female breast: Secondary | ICD-10-CM

## 2018-06-04 DIAGNOSIS — Z170 Estrogen receptor positive status [ER+]: Secondary | ICD-10-CM

## 2018-06-04 NOTE — Progress Notes (Signed)
   Subjective:    Patient ID: Denise [name], female    DOB: 1975-12-16, 42 y.o.   MRN: [id number]  Denise [name] is a 42 year old female here with her husband for follow-up on her left breast reconstruction from 2 weeks ago.  The drain is in with minimal output.  There is no sign of infection or seroma.  Overall she is doing well and handling the changes emotionally well.   Review of Systems  Constitutional: Negative.   HENT: Negative.   Respiratory: Negative.   Gastrointestinal: Negative.   Musculoskeletal: Negative.   Skin: Negative.        Objective:   Physical Exam Vitals signs and nursing note reviewed.  HENT:     Head: Normocephalic.  Neck:     Musculoskeletal: Normal range of motion.  Cardiovascular:     Pulses: Normal pulses.  Neurological:     General: No focal deficit present.     Mental Status: She is alert.  Psychiatric:        Mood and Affect: Mood normal.        Thought Content: Thought content normal.        Judgment: Judgment normal.        Assessment & Plan:  Status post left mastectomy - Plan: Ambulatory referral to Physical Therapy  Acquired absence of left breast  We placed injectable saline in the Expander using a sterile technique: Left: 50 cc for a total of 250 / 375 cc

## 2018-06-04 NOTE — Progress Notes (Signed)
Patient ID: Denise [name], female   DOB: 1975/12/31, 42 y.o.   MRN: [id number]  Chief Complaint  Patient presents with  . Routine Post Op    HPI Denise [name] is a 42 y.o. female here today for post op left mastectomy done on 05/20/2018.Patient states she is doing well at this time. Drain was removed this morning.   HPI  Past Medical History:  Diagnosis Date  . Abnormal breast biopsy    [facility], Dr. Lemar Livings  . Family history of breast cancer   . Family history of ovarian cancer   . Fibroadenoma of breast, left 2000, 2016  . Genetic screening 08/26/2013   BRCA/BART negative/[facility], CHEK2 POS 2017  . Increased risk of breast cancer 2017   IBIS=47%  . Left breast lump 06/18/2017   Fluid/drained [name]  . Migraine   . Monoallelic mutation of CHEK2 gene in female patient 2017   increased risk of breast and colon cancer    Past Surgical History:  Procedure Laterality Date  . BREAST BIOPSY Bilateral 09/08/14   neg  . BREAST BIOPSY Left 03/16/2015   Procedure: BREAST BIOPSY WITH NEEDLE LOCALIZATION;  Surgeon: Earline Mayotte, MD;  Location: [facility];  Service: General;  Laterality: Left;  . BREAST CYST ASPIRATION Left 06/18/2017   cyst aspiration  . BREAST EXCISIONAL BIOPSY Left 09/2014  . BREAST EXCISIONAL BIOPSY Right    age 25's  . BREAST RECONSTRUCTION WITH PLACEMENT OF TISSUE EXPANDER AND FLEX HD (ACELLULAR HYDRATED DERMIS) Left 05/20/2018   Procedure: BREAST RECONSTRUCTION WITH PLACEMENT OF TISSUE EXPANDER AND FLEX HD (ACELLULAR HYDRATED DERMIS);  Surgeon: Peggye Form, DO;  Location: [facility];  Service: Plastics;  Laterality: Left;  . BREAST SURGERY Right March 2000   benign fibroadenoma  . BREAST SURGERY Left 08/08/13   excision  . BREAST SURGERY Left 09/23/14   excision  . MASTECTOMY W/ SENTINEL NODE BIOPSY Left 05/20/2018   Procedure: MASTECTOMY WITH SENTINEL LYMPH NODE BIOPSY;  Surgeon: Earline Mayotte, MD;  Location: [facility];  Service: General;   Laterality: Left;  . PORTACATH PLACEMENT Right 12/17/2017   Procedure: INSERTION PORT-A-CATH;  Surgeon: Earline Mayotte, MD;  Location: [facility];  Service: General;  Laterality: Right;    Family History  Problem Relation Age of Onset  . Prostate cancer Father 57  . Breast cancer Paternal Grandmother 46  . Breast cancer Maternal Aunt 60  . Ovarian cancer Maternal Aunt 70  . Breast cancer Paternal Aunt 87  . Pancreatic cancer Maternal Aunt 38    Social History Social History   Tobacco Use  . Smoking status: Never Smoker  . Smokeless tobacco: Never Used  Substance Use Topics  . Alcohol use: Yes    Comment: occasionally  . Drug use: No    Allergies  Allergen Reactions  . Steri-Strip Compound Benzoin [Benzoin Compound]     Steri-strip ,   . Tape Other (See Comments)    Minor skin irritation     Current Outpatient Medications  Medication Sig Dispense Refill  . acetaminophen (TYLENOL) 500 MG tablet Take 500 mg by mouth every 6 (six) hours as needed for moderate pain or headache.    . Chlorphen-Pseudoephed-APAP (CORICIDIN D PO) Take by mouth.    . diazepam (VALIUM) 2 MG tablet Take 2 mg by mouth every 6 (six) hours as needed for anxiety.    Marland Kitchen HYDROcodone-acetaminophen (NORCO) 5-325 MG tablet Take 1 tablet by mouth every 8 (eight) hours as needed for  moderate pain. 20 tablet 0  . omeprazole (PRILOSEC OTC) 20 MG tablet Take 20 mg by mouth daily as needed (for acid reflux).    . venlafaxine XR (EFFEXOR-XR) 37.5 MG 24 hr capsule Take 1 capsule (37.5 mg total) by mouth daily with breakfast. 30 capsule 2   No current facility-administered medications for this visit.    Facility-Administered Medications Ordered in Other Visits  Medication Dose Route Frequency Provider Last Rate Last Dose  . sodium chloride flush (NS) 0.9 % injection 10 mL  10 mL Intravenous PRN [name], MD   10 mL at 04/17/18 0844    Review of Systems Review of Systems  Constitutional: Negative.    Respiratory: Negative.   Cardiovascular: Negative.     Blood pressure (!) 144/95, pulse (!) 111, temperature 98 F (36.7 C), temperature source Skin, height 5\' 7"  (1.702 m), weight 181 lb (82.1 kg), SpO2 98 %.  Physical Exam Physical Exam Chest:    Skin:    General: Skin is warm.  Neurological:     Mental Status: She is alert and oriented to person, place, and time.     Data Reviewed Measurements of the upper extremities were obtained at a location 15 cm above as well as 10 and 20 cm below the olecranon process.  Right: 34.5, 28, 21 cm.  Left: 35, 27, 20 cm.  No evidence of lymphedema.  Assessment    Doing well status post a mastectomy with immediate reconstruction.  Near complete pathologic response with only a 2 mm and smaller secondary foci of disease in 1 of multiple axillary nodes.    Plan Present discussion is for radiation therapy some time in the future.  Its unclear whether this will be done after the permanent implant is placed or before.  At present we will plan for repeat examination in 2 months.   The patient is aware to call back for any questions or concerns.   HPI, Physical Exam, Assessment and Plan have been scribed under the direction and in the presence of Donnalee Curry, MD.  Ples Specter, CMA  I have completed the exam and reviewed the above documentation for accuracy and completeness.  I agree with the above.  Museum/gallery conservator has been used and any errors in dictation or transcription are unintentional.  Donnalee Curry, M.D., F.A.C.S.  Merrily Pew Tamim Skog 06/05/2018, 5:47 AM

## 2018-06-04 NOTE — Patient Instructions (Signed)
Return in two months.The patient is aware to call back for any questions or concerns. 

## 2018-06-05 ENCOUNTER — Ambulatory Visit
Admission: RE | Admit: 2018-06-05 | Discharge: 2018-06-05 | Disposition: A | Discharge status: Home / Self Care | Payer: BC Managed Care – PPO | Source: Ambulatory Visit | Attending: Radiation Oncology | Admitting: Radiation Oncology

## 2018-06-05 ENCOUNTER — Other Ambulatory Visit: Payer: Self-pay

## 2018-06-05 ENCOUNTER — Encounter: Payer: Self-pay | Admitting: Radiation Oncology

## 2018-06-05 VITALS — BP 132/78 | HR 79 | Temp 97.9°F | Wt 183.8 lb

## 2018-06-05 DIAGNOSIS — Z8041 Family history of malignant neoplasm of ovary: Secondary | ICD-10-CM | POA: Diagnosis not present

## 2018-06-05 DIAGNOSIS — C773 Secondary and unspecified malignant neoplasm of axilla and upper limb lymph nodes: Secondary | ICD-10-CM | POA: Insufficient documentation

## 2018-06-05 DIAGNOSIS — Z1501 Genetic susceptibility to malignant neoplasm of breast: Secondary | ICD-10-CM | POA: Insufficient documentation

## 2018-06-05 DIAGNOSIS — Z9012 Acquired absence of left breast and nipple: Secondary | ICD-10-CM | POA: Insufficient documentation

## 2018-06-05 DIAGNOSIS — Z79899 Other long term (current) drug therapy: Secondary | ICD-10-CM | POA: Diagnosis not present

## 2018-06-05 DIAGNOSIS — Z170 Estrogen receptor positive status [ER+]: Secondary | ICD-10-CM | POA: Diagnosis not present

## 2018-06-05 DIAGNOSIS — C50412 Malignant neoplasm of upper-outer quadrant of left female breast: Secondary | ICD-10-CM | POA: Insufficient documentation

## 2018-06-05 DIAGNOSIS — C50411 Malignant neoplasm of upper-outer quadrant of right female breast: Secondary | ICD-10-CM

## 2018-06-05 DIAGNOSIS — Z9221 Personal history of antineoplastic chemotherapy: Secondary | ICD-10-CM | POA: Diagnosis not present

## 2018-06-05 DIAGNOSIS — Z803 Family history of malignant neoplasm of breast: Secondary | ICD-10-CM | POA: Diagnosis not present

## 2018-06-05 NOTE — Consult Note (Signed)
NEW PATIENT EVALUATION  Name: Denise [name]  MRN: [id number]  Date:   06/05/2018     DOB: [birth year]   This 42 y.o. 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  REFERRING PHYSICIAN: Lequita Asal, MD  CHIEF COMPLAINT:  Chief Complaint  Patient presents with  . Breast Cancer    Initial Eval    DIAGNOSIS: The encounter diagnosis was Malignant neoplasm of upper-outer quadrant of right female breast, unspecified estrogen receptor status ([place]).   PREVIOUS INVESTIGATIONS:  Mammograms ultrasound and MRI scans reviewed Clinical notes reviewed Pathology reports reviewed  HPI: patient is a 42 year old female presented with an abnormal mammogram of her left breast showing a 1.6 cm mass in the upper outer quadrant as well as abnormal axillary lymph node.showed an MRI scan showing mass at 11:00 position 4 cm from the nipple and probably 2 satellite lesions as noted above. Calcifications extend up to 5 cm posteriorly to the biopsy malignancy.there is also a node suspicious in the posterior lateral margin of the pectoralis minor.Biopsy was positive for triple positive invasive mammary carcinoma.she underwent neoadjuvant chemotherapy withTCHP with Udencya support. She tolerated her chemotherapy well she then underwent a left modified radical mastectomy with spacer plate stage for reconstruction.pathology revealed2 metastatic foci in one of 2 axillary sentinel lymph nodes measuring 2 and 1 mm. The was evidence of tumor progression. Left breast had no evidence of residual carcinoma with evidence of tumor regression and extensive microcalcifications.she's had a spacer placed at the time of mastectomy. She is now continuing on Herceptin and perJetta which she is tolerating well. Her case was discussed at our breast  cancer conference based on the limited axillary dissection and one of 2 positive nodes as well as triple-positive disease and young age I recommended adjuvant whole breast and peripheral lymphatic radiation. She is seen today she is doing well. She specifically denies breast tenderness cough or bone pain. She did have a negative bone scan as part of her workup.  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    PAST SURGICAL HISTORY:  Past Surgical History:  Procedure Laterality Date  . BREAST BIOPSY Bilateral 09/08/14   neg  . BREAST BIOPSY Left 03/16/2015   Procedure: BREAST BIOPSY WITH NEEDLE LOCALIZATION;  Surgeon: [name], MD;  Location: [facility];  Service: General;  Laterality: Left;  . BREAST CYST ASPIRATION Left 06/18/2017   cyst aspiration  . BREAST EXCISIONAL BIOPSY Left 09/2014  . BREAST EXCISIONAL BIOPSY Right    age 72's  . BREAST RECONSTRUCTION WITH PLACEMENT OF TISSUE EXPANDER AND FLEX HD (ACELLULAR HYDRATED DERMIS) Left 05/20/2018   Procedure: BREAST RECONSTRUCTION WITH PLACEMENT OF TISSUE EXPANDER AND FLEX HD (ACELLULAR HYDRATED DERMIS);  Surgeon: [name], DO;  Location: [facility];  Service: Plastics;  Laterality: Left;  . BREAST SURGERY Right March 2000   benign fibroadenoma  . BREAST SURGERY Left 08/08/13   excision  . BREAST SURGERY Left 09/23/14   excision  . MASTECTOMY W/ SENTINEL NODE BIOPSY Left  05/20/2018   Procedure: MASTECTOMY WITH SENTINEL LYMPH NODE BIOPSY;  Surgeon: [name], MD;  Location: [facility];  Service: General;  Laterality: Left;  .  PORTACATH PLACEMENT Right 12/17/2017   Procedure: INSERTION PORT-A-CATH;  Surgeon: [name], MD;  Location: [facility];  Service: General;  Laterality: Right;    FAMILY HISTORY: family history includes Breast cancer (age of onset: 50) in her maternal aunt and paternal aunt; Breast cancer (age of onset: 74) in her paternal grandmother; Ovarian cancer (age of onset: 84) in her maternal aunt; Pancreatic cancer (age of onset: 52) in her maternal aunt; Prostate cancer (age of onset: 64) in her father.  SOCIAL HISTORY:  reports that she has never smoked. She has never used smokeless tobacco. She reports current alcohol use. She reports that she does not use drugs.  ALLERGIES: Steri-strip compound benzoin [benzoin compound] and Tape  MEDICATIONS:  Current Outpatient Medications  Medication Sig Dispense Refill  . acetaminophen (TYLENOL) 500 MG tablet Take 500 mg by mouth every 6 (six) hours as needed for moderate pain or headache.    . Chlorphen-Pseudoephed-APAP (CORICIDIN D PO) Take by mouth.    . diazepam (VALIUM) 2 MG tablet Take 2 mg by mouth every 6 (six) hours as needed for anxiety.    Marland Kitchen HYDROcodone-acetaminophen (NORCO) 5-325 MG tablet Take 1 tablet by mouth every 8 (eight) hours as needed for moderate pain. 20 tablet 0  . omeprazole (PRILOSEC OTC) 20 MG tablet Take 20 mg by mouth daily as needed (for acid reflux).    . venlafaxine XR (EFFEXOR-XR) 37.5 MG 24 hr capsule Take 1 capsule (37.5 mg total) by mouth daily with breakfast. 30 capsule 2   No current facility-administered medications for this encounter.    Facility-Administered Medications Ordered in Other Encounters  Medication Dose Route Frequency Provider Last Rate Last Dose  . sodium chloride flush (NS) 0.9 % injection 10 mL  10 mL Intravenous PRN Nolon Stalls C, MD   10 mL at 04/17/18 0844    ECOG PERFORMANCE STATUS:  0 - Asymptomatic  REVIEW OF SYSTEMS: Patient denies any weight loss, fatigue, weakness, fever,  chills or night sweats. Patient denies any loss of vision, blurred vision. Patient denies any ringing  of the ears or hearing loss. No irregular heartbeat. Patient denies heart murmur or history of fainting. Patient denies any chest pain or pain radiating to her upper extremities. Patient denies any shortness of breath, difficulty breathing at night, cough or hemoptysis. Patient denies any swelling in the lower legs. Patient denies any nausea vomiting, vomiting of blood, or coffee ground material in the vomitus. Patient denies any stomach pain. Patient states has had normal bowel movements no significant constipation or diarrhea. Patient denies any dysuria, hematuria or significant nocturia. Patient denies any problems walking, swelling in the joints or loss of balance. Patient denies any skin changes, loss of hair or loss of weight. Patient denies any excessive worrying or anxiety or significant depression. Patient denies any problems with insomnia. Patient denies excessive thirst, polyuria, polydipsia. Patient denies any swollen glands, patient denies easy bruising or easy bleeding. Patient denies any recent infections, allergies or URI. Patient "s visual fields have not changed significantly in recent time.   PHYSICAL EXAM: BP 132/78   Pulse 79   Temp 97.9 F (36.6 C)   Wt 183 lb 12.1 oz (83.3 kg)   BMI 28.78 kg/m  Patient is status post left modified radical mastectomy with spacer placed. No dominant mass or  nodularity is noted in the left chest wall or right breast in 2 positions examined no axillary or supraclavicular adenopathy is identified. No evidence of lymphedema of her left upper extremity is noted.Well-developed well-nourished patient in NAD. HEENT reveals PERLA, EOMI, discs not visualized.  Oral cavity is clear. No oral mucosal lesions are identified. Neck is clear without evidence of cervical or supraclavicular adenopathy. Lungs are clear to A&P. Cardiac examination is essentially  unremarkable with regular rate and rhythm without murmur rub or thrill. Abdomen is benign with no organomegaly or masses noted. Motor sensory and DTR levels are equal and symmetric in the upper and lower extremities. Cranial nerves II through XII are grossly intact. Proprioception is intact. No peripheral adenopathy or edema is identified. No motor or sensory levels are noted. Crude visual fields are within normal range.  LABORATORY DATA: pathology reports reviewed    RADIOLOGY RESULTS:mammogram and ultrasound reviewed   IMPRESSION: stage IIa(T1 N1 M0) triple positive invasive mammary carcinoma left breast as was new adjuvant chemotherapy than left modified radical mastectomy in 42 year old female  PLAN: at the present time based on her young age triple positive disease one of 2 sentinel nodes positive without full axillary clearing would recommend left chest wall peripheral lymphatic radiation. Would plan on delivering 5040 cGy in 28 fractions to both areas. Risks and benefits of treatment including skin reaction fatigue alteration of blood counts and slight chance of lymphedema of left upper extremity and chance of scarring preventing reconstruction all were discussed in detail with the patient and her husband. They both seem to comprehend my treatment plan well. Plastic surgery has recommended 6 months between radiation and reconstruction. I have personally set up and ordered CT simulation after the Christmas holiday.There will be extra effort by both professional staff as well as technical staff to coordinate and manage concurrent chemoradiation and ensuing side effects during her treatments.patient seems to comprehend my treatment plan well.  I would like to take this opportunity to thank you for allowing me to participate in the care of your patient.Noreene Filbert, MD

## 2018-06-07 ENCOUNTER — Other Ambulatory Visit: Payer: Self-pay | Admitting: Hematology and Oncology

## 2018-06-07 DIAGNOSIS — Z170 Estrogen receptor positive status [ER+]: Principal | ICD-10-CM

## 2018-06-07 DIAGNOSIS — C50412 Malignant neoplasm of upper-outer quadrant of left female breast: Secondary | ICD-10-CM

## 2018-06-07 NOTE — Progress Notes (Signed)
START ON PATHWAY REGIMEN - Breast     A cycle is every 21 days:     Ado-trastuzumab emtansine   **Always confirm dose/schedule in your pharmacy ordering system**  Patient Characteristics: Post-Neoadjuvant Therapy and Resection, HER2 Positive, ER Positive, Residual Disease, Adjuvant Targeted Therapy After Neoadjuvant Chemo/Targeted Therapy Therapeutic Status: Post-Neoadjuvant Therapy and Resection ER Status: Positive (+) HER2 Status: Positive (+) PR Status: Positive (+) Residual Invasive Disease Post-Neoadjuvant Therapy<= Yes Intent of Therapy: Curative Intent, Not Discussed with Patient

## 2018-06-17 ENCOUNTER — Ambulatory Visit
Admission: RE | Admit: 2018-06-17 | Discharge: 2018-06-17 | Disposition: A | Discharge status: Home / Self Care | Payer: BC Managed Care – PPO | Source: Ambulatory Visit | Attending: Radiation Oncology | Admitting: Radiation Oncology

## 2018-06-17 DIAGNOSIS — Z170 Estrogen receptor positive status [ER+]: Secondary | ICD-10-CM | POA: Diagnosis not present

## 2018-06-17 DIAGNOSIS — C50412 Malignant neoplasm of upper-outer quadrant of left female breast: Secondary | ICD-10-CM | POA: Insufficient documentation

## 2018-06-18 ENCOUNTER — Ambulatory Visit (INDEPENDENT_AMBULATORY_CARE_PROVIDER_SITE_OTHER): Payer: BC Managed Care – PPO | Admitting: Plastic Surgery

## 2018-06-18 ENCOUNTER — Encounter: Payer: Self-pay | Admitting: Plastic Surgery

## 2018-06-18 ENCOUNTER — Ambulatory Visit
Admission: RE | Admit: 2018-06-18 | Discharge: 2018-06-18 | Disposition: A | Discharge status: Home / Self Care | Payer: BC Managed Care – PPO | Source: Ambulatory Visit | Attending: Hematology and Oncology | Admitting: Hematology and Oncology

## 2018-06-18 VITALS — BP 134/94 | HR 90 | Temp 98.2°F | Ht 67.0 in | Wt 181.0 lb

## 2018-06-18 DIAGNOSIS — Z170 Estrogen receptor positive status [ER+]: Secondary | ICD-10-CM | POA: Insufficient documentation

## 2018-06-18 DIAGNOSIS — C50411 Malignant neoplasm of upper-outer quadrant of right female breast: Secondary | ICD-10-CM

## 2018-06-18 DIAGNOSIS — C50412 Malignant neoplasm of upper-outer quadrant of left female breast: Secondary | ICD-10-CM | POA: Insufficient documentation

## 2018-06-18 DIAGNOSIS — N6310 Unspecified lump in the right breast, unspecified quadrant: Secondary | ICD-10-CM

## 2018-06-18 DIAGNOSIS — Z9889 Other specified postprocedural states: Secondary | ICD-10-CM

## 2018-06-18 DIAGNOSIS — Z9882 Breast implant status: Secondary | ICD-10-CM

## 2018-06-18 DIAGNOSIS — N951 Menopausal and female climacteric states: Secondary | ICD-10-CM | POA: Insufficient documentation

## 2018-06-18 DIAGNOSIS — Z9012 Acquired absence of left breast and nipple: Secondary | ICD-10-CM

## 2018-06-18 NOTE — Progress Notes (Signed)
   Subjective:    Patient ID: Denise [name], female    DOB: 08-08-1975, 42 y.o.   MRN: [id number]  Denise [name] is a 42 year old female here with her husband for follow-up on her left breast reconstruction.  She had the mastectomy and the expander placed and we have been expanding.  She has been doing very well with the expansion.  She was marked for the radiation and that is scheduled to start this week.  She did check with the radiation oncologist who said she could continue to expand.  I will keep in touch with them to be sure that there is no interference.     Review of Systems  Constitutional: Negative.   HENT: Negative.   Eyes: Negative.   Respiratory: Negative.   Cardiovascular: Negative.   Gastrointestinal: Negative.   Endocrine: Negative.   Genitourinary: Negative.   Musculoskeletal: Negative.        Objective:   Physical Exam Vitals signs and nursing note reviewed.  Constitutional:      Appearance: Normal appearance.  HENT:     Head: Normocephalic.  Cardiovascular:     Rate and Rhythm: Normal rate.  Skin:    General: Skin is warm.  Neurological:     Mental Status: She is alert.  Psychiatric:        Mood and Affect: Mood normal.        Thought Content: Thought content normal.        Judgment: Judgment normal.        Assessment & Plan:  Malignant neoplasm of upper-outer quadrant of right female breast, unspecified estrogen receptor status ([facility])  Breast mass, right  Status post left mastectomy  S/P breast reconstruction  We placed injectable saline in the Expander using a sterile technique: Left: 50 cc for a total of 300 / 375 cc

## 2018-06-18 NOTE — Progress Notes (Signed)
*  PRELIMINARY RESULTS* Echocardiogram 2D Echocardiogram has been performed.  Denise [name] 06/18/2018, 10:49 AM

## 2018-06-19 LAB — ECHOCARDIOGRAM COMPLETE
Height: 67 in
Weight: 2896 oz

## 2018-06-20 ENCOUNTER — Inpatient Hospital Stay: Payer: BC Managed Care – PPO

## 2018-06-20 ENCOUNTER — Inpatient Hospital Stay: Payer: BC Managed Care – PPO | Attending: Urgent Care

## 2018-06-20 ENCOUNTER — Inpatient Hospital Stay (HOSPITAL_BASED_OUTPATIENT_CLINIC_OR_DEPARTMENT_OTHER): Payer: BC Managed Care – PPO | Admitting: Urgent Care

## 2018-06-20 VITALS — BP 138/91 | HR 94 | Temp 97.9°F | Resp 18 | Wt 184.1 lb

## 2018-06-20 VITALS — BP 130/87 | HR 87

## 2018-06-20 DIAGNOSIS — R945 Abnormal results of liver function studies: Secondary | ICD-10-CM | POA: Diagnosis not present

## 2018-06-20 DIAGNOSIS — C50412 Malignant neoplasm of upper-outer quadrant of left female breast: Secondary | ICD-10-CM

## 2018-06-20 DIAGNOSIS — C773 Secondary and unspecified malignant neoplasm of axilla and upper limb lymph nodes: Secondary | ICD-10-CM

## 2018-06-20 DIAGNOSIS — Z79899 Other long term (current) drug therapy: Secondary | ICD-10-CM | POA: Insufficient documentation

## 2018-06-20 DIAGNOSIS — R232 Flushing: Secondary | ICD-10-CM

## 2018-06-20 DIAGNOSIS — Z170 Estrogen receptor positive status [ER+]: Secondary | ICD-10-CM | POA: Insufficient documentation

## 2018-06-20 DIAGNOSIS — Z5112 Encounter for antineoplastic immunotherapy: Secondary | ICD-10-CM | POA: Insufficient documentation

## 2018-06-20 DIAGNOSIS — N6310 Unspecified lump in the right breast, unspecified quadrant: Secondary | ICD-10-CM

## 2018-06-20 DIAGNOSIS — Z9012 Acquired absence of left breast and nipple: Secondary | ICD-10-CM | POA: Insufficient documentation

## 2018-06-20 DIAGNOSIS — M791 Myalgia, unspecified site: Secondary | ICD-10-CM | POA: Insufficient documentation

## 2018-06-20 DIAGNOSIS — Z5111 Encounter for antineoplastic chemotherapy: Secondary | ICD-10-CM

## 2018-06-20 DIAGNOSIS — C50419 Malignant neoplasm of upper-outer quadrant of unspecified female breast: Secondary | ICD-10-CM

## 2018-06-20 DIAGNOSIS — R7989 Other specified abnormal findings of blood chemistry: Secondary | ICD-10-CM

## 2018-06-20 DIAGNOSIS — N951 Menopausal and female climacteric states: Secondary | ICD-10-CM

## 2018-06-20 LAB — CBC WITH DIFFERENTIAL/PLATELET
Abs Immature Granulocytes: 0 10*3/uL (ref 0.00–0.07)
Basophils Absolute: 0 10*3/uL (ref 0.0–0.1)
Basophils Relative: 1 %
Eosinophils Absolute: 0.3 10*3/uL (ref 0.0–0.5)
Eosinophils Relative: 8 %
HCT: 37.1 % (ref 36.0–46.0)
Hemoglobin: 12.3 g/dL (ref 12.0–15.0)
Immature Granulocytes: 0 %
Lymphocytes Relative: 37 %
Lymphs Abs: 1.5 10*3/uL (ref 0.7–4.0)
MCH: 29.4 pg (ref 26.0–34.0)
MCHC: 33.2 g/dL (ref 30.0–36.0)
MCV: 88.8 fL (ref 80.0–100.0)
Monocytes Absolute: 0.4 10*3/uL (ref 0.1–1.0)
Monocytes Relative: 10 %
Neutro Abs: 1.7 10*3/uL (ref 1.7–7.7)
Neutrophils Relative %: 44 %
Platelets: 168 10*3/uL (ref 150–400)
RBC: 4.18 MIL/uL (ref 3.87–5.11)
RDW: 12.1 % (ref 11.5–15.5)
WBC: 3.9 10*3/uL — ABNORMAL LOW (ref 4.0–10.5)
nRBC: 0 % (ref 0.0–0.2)

## 2018-06-20 LAB — COMPREHENSIVE METABOLIC PANEL
ALT: 42 U/L (ref 0–44)
AST: 32 U/L (ref 15–41)
Albumin: 4.3 g/dL (ref 3.5–5.0)
Alkaline Phosphatase: 78 U/L (ref 38–126)
Anion gap: 7 (ref 5–15)
BUN: 11 mg/dL (ref 6–20)
CO2: 27 mmol/L (ref 22–32)
Calcium: 9.4 mg/dL (ref 8.9–10.3)
Chloride: 106 mmol/L (ref 98–111)
Creatinine, Ser: 0.6 mg/dL (ref 0.44–1.00)
GFR calc Af Amer: >60 mL/min (ref >60)
GFR calc non Af Amer: >60 mL/min (ref >60)
Glucose, Bld: 105 mg/dL — ABNORMAL HIGH (ref 70–99)
Potassium: 4 mmol/L (ref 3.5–5.1)
Sodium: 140 mmol/L (ref 135–145)
Total Bilirubin: 0.9 mg/dL (ref 0.3–1.2)
Total Protein: 7.1 g/dL (ref 6.5–8.1)

## 2018-06-20 LAB — MAGNESIUM: Magnesium: 1.7 mg/dL (ref 1.7–2.4)

## 2018-06-20 LAB — BILIRUBIN, DIRECT: Bilirubin, Direct: 0.1 mg/dL (ref 0.0–0.2)

## 2018-06-20 MED ORDER — SODIUM CHLORIDE 0.9 % IV SOLN
320.0000 mg | Freq: Once | INTRAVENOUS | Status: AC
  Administered 2018-06-20: 320 mg via INTRAVENOUS
  Filled 2018-06-20: qty 8

## 2018-06-20 MED ORDER — SODIUM CHLORIDE 0.9 % IV SOLN
Freq: Once | INTRAVENOUS | Status: AC
  Administered 2018-06-20: 10:00:00 via INTRAVENOUS
  Filled 2018-06-20: qty 250

## 2018-06-20 MED ORDER — HEPARIN SOD (PORK) LOCK FLUSH 100 UNIT/ML IV SOLN
500.0000 [IU] | Freq: Once | INTRAVENOUS | Status: AC | PRN
  Administered 2018-06-20: 500 [IU]
  Filled 2018-06-20: qty 5

## 2018-06-20 MED ORDER — ACETAMINOPHEN 325 MG PO TABS
650.0000 mg | ORAL_TABLET | Freq: Once | ORAL | Status: AC
  Administered 2018-06-20: 650 mg via ORAL
  Filled 2018-06-20: qty 2

## 2018-06-20 MED ORDER — DIPHENHYDRAMINE HCL 25 MG PO CAPS
50.0000 mg | ORAL_CAPSULE | Freq: Once | ORAL | Status: AC
  Administered 2018-06-20: 50 mg via ORAL
  Filled 2018-06-20: qty 2

## 2018-06-20 NOTE — Progress Notes (Signed)
[facility] day:  06/20/18  Chief Complaint: Denise [name] is a 43 y.o. female with multi-focal Her2/neu + left breast cancer who is seen for assessment prior to initiation of Kadcyla (new).  HPI: The patient was last seen in the medical oncology clinic on 05/30/2018.  At that time, URI symptoms had resolved with the prescribed course of azithromycin. She was recovering well from her mastectomy (no complications). Patient denies B symptoms. Eating well; weight down 4 pounds. Treatment related symptoms well managed. Exam revealed post-operative changes.  WBC 4900 ([place] 2600).  Hemoglobin 11.8, hematocrit 35.8, and platelets 212,000.  Total bilirubin elevated 1.3 mg/dL (direct 0.2 mg/dL).  She received Herceptin + Perjeta on 05/30/2018.  She was seen in follow up consult on 06/04/2018 and 06/18/2018 by Dr. Audelia Hives (plastic surgery).  Notes reviewed.  Patient with tissue expander in place following breast resonstruction.  Additional 50 cc fluid added each visit (total 300 of 375 cc capacity).    She was seen in follow up consult on 06/04/2018 by Dr. Hervey Ard (general surgery).  Notes reviewed.  Patient was doing well. There was no evidence of seroma or infection noted. Drain was removed. Patient scheduled to follow-up with surgery in 2 months.  Patient was seen in consult on 06/05/2018 by Dr. Noreene Filbert (radiation oncology). She was deemed to be an appropriate candidate for treatment with radiation. Plans are for radiation therapy to LEFT chest wall and peripheral lymphatics, delivering 5040 cGy in 28 fractions. CT simulation planned for 06/17/2018. Radiation therapy treatments scheduled to begin on 06/25/2018.  Repeat echocardiogram on 06/18/2018 revealed an LVEF of 55 to 60%, with no evidence of RWMA's.  Symptomatically, patient is doing well. She denies any acute problems. Her energy level has been stable. She has had some minor  "head cold" symptoms over the last few days, however she has not mounted a fever response. Symptoms started on 06/18/2018. 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 She denies nausea, vomiting, or changes to her bowel habits. Patient denies that she has experienced any B symptoms.   Patient advises that she maintains an adequate appetite. She is eating well. Weight today is 184 lb 2 oz (83.5 kg), which compared to her last visit to the clinic, represents a 3 pond increase.     Patient denies pain in the clinic today.   Past Medical History:  Diagnosis Date  . Abnormal breast biopsy    [facility], [name]  . Family history of breast cancer   . Family history of ovarian cancer   . Fibroadenoma of breast, left 2000, 2016  . Genetic screening 08/26/2013   BRCA/BART negative/[facility], CHEK2 POS 2017  . Increased risk of breast cancer 2017   IBIS=47%  . Left breast lump 06/18/2017   Fluid/drained [name]  . Migraine   . Monoallelic mutation of CHEK2 gene in female patient 2017   increased risk of breast and colon cancer    Past Surgical History:  Procedure Laterality Date  . BREAST BIOPSY Bilateral 09/08/14   neg  . BREAST BIOPSY Left 03/16/2015   Procedure: BREAST BIOPSY WITH NEEDLE LOCALIZATION;  Surgeon: [name], MD;  Location: [facility] ORS;  Service: General;  Laterality: Left;  . BREAST CYST ASPIRATION Left 06/18/2017   cyst aspiration  . BREAST EXCISIONAL BIOPSY Left 09/2014  . BREAST EXCISIONAL BIOPSY Right    age 61's  .  BREAST RECONSTRUCTION WITH PLACEMENT OF TISSUE EXPANDER AND FLEX HD (ACELLULAR HYDRATED DERMIS) Left 05/20/2018   Procedure: BREAST RECONSTRUCTION WITH PLACEMENT OF TISSUE EXPANDER AND FLEX HD (ACELLULAR HYDRATED DERMIS);  Surgeon: [name], DO;  Location: [facility] ORS;  Service: Plastics;  Laterality: Left;  . BREAST SURGERY Right March 2000   benign fibroadenoma  . BREAST SURGERY Left 08/08/13   excision   . BREAST SURGERY Left 09/23/14   excision  . MASTECTOMY W/ SENTINEL NODE BIOPSY Left 05/20/2018   Procedure: MASTECTOMY WITH SENTINEL LYMPH NODE BIOPSY;  Surgeon: [name], MD;  Location: [facility] OR[facility];  Service: General;  Laterality: Left;  . PORTACATH PLACEMENT Right 12/17/2017   Procedure: INSERTION PORT-A-CATH;  Surgeon: [name], MD;  Location: [facility] OR[facility];  Service: General;  Laterality: Right;    Family History  Problem Relation Age of Onset  . Prostate cancer Father 60  . Breast cancer Paternal Grandmother 67  . Breast cancer Maternal Aunt 60  . Ovarian cancer Maternal Aunt 70  . Breast cancer Paternal Aunt 60  . Pancreatic cancer Maternal Aunt 75    Social History:  reports that she has never smoked. She has never used smokeless tobacco. She reports current alcohol use. She reports that she does not use drugs.  She does not smoke. She "occasionally" drinks alcohol. 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 Patient denies known exposures to radiation on toxins. Her husband's name is [name]. She has 2 daughters, [name] and [name] (ages 31 1/2 and 62).  Her husband's name is [name].  The patient is accompanied by her mother today.  Allergies:  Allergies  Allergen Reactions  . Steri-Strip Compound Benzoin [Benzoin Compound]     Steri-strip ,   . Tape Other (See Comments)    Minor skin irritation     Current Medications: Current Outpatient Medications  Medication Sig Dispense Refill  . acetaminophen (TYLENOL) 500 MG tablet Take 500 mg by mouth every 6 (six) hours as needed for moderate pain or headache.    . Chlorphen-Pseudoephed-APAP (CORICIDIN D PO) Take by mouth.    . diazepam (VALIUM) 2 MG tablet Take 2 mg by mouth every 6 (six) hours as needed for anxiety.    Marland Kitchen HYDROcodone-acetaminophen (NORCO) 5-325 MG tablet Take 1 tablet by mouth every 8 (eight) hours as needed for moderate pain. 20 tablet 0  . omeprazole (PRILOSEC OTC) 20 MG tablet Take  20 mg by mouth daily as needed (for acid reflux).    . venlafaxine XR (EFFEXOR-XR) 37.5 MG 24 hr capsule Take 1 capsule (37.5 mg total) by mouth daily with breakfast. 30 capsule 2   No current facility-administered medications for this visit.    Facility-Administered Medications Ordered in Other Visits  Medication Dose Route Frequency Provider Last Rate Last Dose  . sodium chloride flush (NS) 0.9 % injection 10 mL  10 mL Intravenous PRN Nolon Stalls C, MD   10 mL at 04/17/18 0844    Review of Systems  Constitutional: Negative for diaphoresis, fever, malaise/fatigue and weight loss (up 3 pounds).  HENT: Positive for congestion. Negative for sinus pain and sore throat.   Eyes: Negative.   Respiratory: Negative for cough, hemoptysis, sputum production and shortness of breath.   Cardiovascular: Negative for chest pain, palpitations, orthopnea, leg swelling and PND.  Gastrointestinal: Negative for abdominal pain, blood in stool, constipation, diarrhea, melena, nausea and vomiting.  Genitourinary: Negative for dysuria, frequency, hematuria and urgency.  Musculoskeletal: Negative for  back pain, falls, joint pain and myalgias.  Skin: Negative for itching and rash.  Neurological: Negative for dizziness, tremors, weakness and headaches.  Endo/Heme/Allergies: Does not bruise/bleed easily.       PMH (+) for diabetes. Vasomotor symptoms; improved.   Psychiatric/Behavioral: Negative for depression, memory loss and suicidal ideas. The patient is not nervous/anxious and does not have insomnia.   All other systems reviewed and are negative.  Performance status (ECOG): 1 - Symptomatic but completely ambulatory  Vital Signs BP (!) 138/91 (BP Location: Left Arm, Patient Position: Sitting)   Pulse 94   Temp 97.9 F (36.6 C) (Tympanic)   Resp 18   Wt 184 lb 2 oz (83.5 kg)   BMI 28.84 kg/m   Physical Exam  Constitutional: She is oriented to person, place, and time and well-developed,  well-nourished, and in no distress.  HENT:  Head: Normocephalic and atraumatic. 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  Nose: Rhinorrhea present. No mucosal edema or sinus tenderness.  Mouth/Throat: Oropharynx is clear and moist and mucous membranes are normal.  Eyes: Pupils are equal, round, and reactive to light. EOM are normal. No scleral icterus.  Neck: Normal range of motion. Neck supple. No tracheal deviation present. No thyromegaly present.  Cardiovascular: Normal rate, regular rhythm, normal heart sounds and intact distal pulses. Exam reveals no gallop and no friction rub.  No murmur heard. Pulmonary/Chest: Effort normal and breath sounds normal. No respiratory distress. She has no wheezes. She has no rales. Breasts are asymmetrical (LEFT mastectomy s/p resconstruction; tissue expander in place).  Abdominal: Soft. Bowel sounds are normal. She exhibits no distension. There is no abdominal tenderness.  Musculoskeletal: Normal range of motion.        General: No tenderness or edema.  Lymphadenopathy:    She has no cervical adenopathy.    She has no axillary adenopathy.       Right: No inguinal and no supraclavicular adenopathy present.       Left: No inguinal and no supraclavicular adenopathy present.  Neurological: She is alert and oriented to person, place, and time.  Skin: Skin is warm and dry. No rash noted. No erythema.  Psychiatric: Mood, affect and judgment normal.  Nursing note and vitals reviewed.   Infusion on 06/20/2018  Component Date Value Ref Range Status  . Sodium 06/20/2018 140  135 - 145 mmol/L Final  . Potassium 06/20/2018 4.0  3.5 - 5.1 mmol/L Final  . Chloride 06/20/2018 106  98 - 111 mmol/L Final  . CO2 06/20/2018 27  22 - 32 mmol/L Final  . Glucose, Bld 06/20/2018 105* 70 - 99 mg/dL Final  . BUN 06/20/2018 11  6 - 20 mg/dL Final  . Creatinine, Ser 06/20/2018 0.60  0.44 - 1.00 mg/dL Final  . Calcium 06/20/2018 9.4  8.9 - 10.3 mg/dL Final  . Total  Protein 06/20/2018 7.1  6.5 - 8.1 g/dL Final  . Albumin 06/20/2018 4.3  3.5 - 5.0 g/dL Final  . AST 06/20/2018 32  15 - 41 U/L Final  . ALT 06/20/2018 42  0 - 44 U/L Final  . Alkaline Phosphatase 06/20/2018 78  38 - 126 U/L Final  . Total Bilirubin 06/20/2018 0.9  0.3 - 1.2 mg/dL Final  . GFR calc non Af Amer 06/20/2018 >60  >60 mL/min Final  . GFR calc Af Amer 06/20/2018 >60  >60 mL/min Final  . Anion gap 06/20/2018 7  5 - 15 Final   Performed at [facility], [place]  [address] [place], [place] [address]  . WBC 06/20/2018 3.9* 4.0 - 10.5 K/uL Final  . RBC 06/20/2018 4.18  3.87 - 5.11 MIL/uL Final  . Hemoglobin 06/20/2018 12.3  12.0 - 15.0 g/dL Final  . HCT 06/20/2018 37.1  36.0 - 46.0 % Final  . MCV 06/20/2018 88.8  80.0 - 100.0 fL Final  . MCH 06/20/2018 29.4  26.0 - 34.0 pg Final  . MCHC 06/20/2018 33.2  30.0 - 36.0 g/dL Final  . RDW 06/20/2018 12.1  11.5 - 15.5 % Final  . Platelets 06/20/2018 168  150 - 400 K/uL Final  . nRBC 06/20/2018 0.0  0.0 - 0.2 % Final  . Neutrophils Relative % 06/20/2018 44  % Final  . Neutro Abs 06/20/2018 1.7  1.7 - 7.7 K/uL Final  . Lymphocytes Relative 06/20/2018 37  % Final  . Lymphs Abs 06/20/2018 1.5  0.7 - 4.0 K/uL Final  . Monocytes Relative 06/20/2018 10  % Final  . Monocytes Absolute 06/20/2018 0.4  0.1 - 1.0 K/uL Final  . Eosinophils Relative 06/20/2018 8  % Final  . Eosinophils Absolute 06/20/2018 0.3  0.0 - 0.5 K/uL Final  . Basophils Relative 06/20/2018 1  % Final  . Basophils Absolute 06/20/2018 0.0  0.0 - 0.1 K/uL Final  . Immature Granulocytes 06/20/2018 0  % Final  . Abs Immature Granulocytes 06/20/2018 0.00  0.00 - 0.07 K/uL Final   Performed at [facility], [address] [place], [place] [address]  . Bilirubin, Direct 06/20/2018 0.1  0.0 - 0.2 mg/dL Final   Performed at [facility], [address] [place], [place] [address]  . Magnesium 06/20/2018 1.7  1.7 - 2.4 mg/dL Final   Performed at [facility],  [address], [place], [place] [address]  Hospital Outpatient Visit on 06/18/2018  Component Date Value Ref Range Status  . Weight 06/18/2018 2,896  oz Final  . Height 06/18/2018 67  in Final  . BP 06/18/2018 134/94  mmHg Final    Assessment:  BONNITA NEWBY is a 43 y.o. 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  Pathology revealed no residual carcinoma in the breast.  One of three sentinel lymph nodes were positive.  One lymph node had 2 metastatic foci (2.1 mm and 1 mm).  Pathologic stage revealed ypT0 ypN1a.  Index breast mass and axillary node biopsy on 12/06/2017 revealed grade II invasive ductal carcinoma with calcifications at the 11 o'clock position.  There was metastatic carcinoma in 1 of 1 lymph nodes.  Tumor was ER + (100%), PR + (100%), and Ki67 5%.  Her2/neu was 3+ by IHC (heterogeneous) and FISH +.  Diagnostic left mammogram and ultrasound on 12/06/2017 revealed a 1.7 x 1.3 x 1.3 cm irregular hypoechoic mass with internal calcifications at the 11 o'clock position 2 cm from the nipple with internal calcifications. There was a similar appearing 0.9 x 0.8 x 0.7 cm mass at the 11 o'clock position 4 cm from the nipple (2.4 cm from the index lesion).  There were suspicious, segmental calcifications originating from the index lesion and extending 5 cm posteriorly.  There was a 0.6 cm morphologically abnormal left axillary lymph node.  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 The adjacent and more superiorly located  11 o'clock mass (7 x 12 mm) seen on recent ultrasound, 4 cm from the nipple on ultrasound, was also identified. There were 2 probable satellite lesions (7 mm, medial lesion; posterior and lateral lesion). The total span of malignancy was 3 x  3.1 x 4.5 cm.  There were calcifications extending up to 5 cm posterior to the biopsied malignancy which were highly suspicious on mammography but not appreciated.  There was a mass in the lower outer left breast measured 5 mm, representing a change.  The known metastatic node in the left axilla was identified. A node along the posterolateral margin of the pectoralis minor (cortex 5 mm) was nonspecific but at least somewhat suspicious.  Chest, abdomen, and pelvic CT on 12/19/2017 revealed a solitary enlarged biopsy-proven metastatic left axillary lymph node.  There were no additional findings suspicious for metastatic disease in the chest, abdomen or pelvis.  There was a nonspecific tiny sclerotic upper right sacral lesion, more likely a benign bone island. Bone scintigraphy correlation versus follow-up CT could be considered.  Bone scan on 01/11/2018 was negative for metastatic pattern. No areas of focal tracer uptake. Area of concern in sacrum on CT likely represents benign bone island.   Myriad genetic testing on 08/26/2013 was negative for BRCA1 and 2. 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  She has a family history significant for breast, ovarian, pancreatic, and prostate cancer.  She received 6 cycles of TCHP chemotherapy (12/21/2017 - 04/17/2018) with Margarette Canada support. Cycle #3 was held due to elevated LFTs on 02/04/2018. She received Herceptin + Perjeta alone (04/30/2018 - 05/30/2018).   Echocardiograms:  12/20/2017 - EF of 60-65%, 03/27/2018 - EF of 60-65%, and 06/18/2018 - EF of 55-60%.  She has a history of elevated LFTs.  Hepatitis B and C serologies were negative on 02/04/2018.  RUQ ultrasound on 02/06/2018 revealed a small anterior gallbladder wall polyp. There was no evidence of cholelithiasis or cholecystis. CBD measured normal at 2 mm, with no evidence of choledocholithiasis. There was normal direction of blood flow towards the liver  noted.  Symptomatically, patient has minor cold symptoms that developed earlier in th eweek. No fevers. No B symptoms. No significant treatment related symptoms. Exam unremarkable. WBC 3900 ([place] 1700).    Plan: 1. Labs today CBC with differential, CMP, magnesium, direct bilirubin 2. Stage IB multifocal LEFT breast cancer  Review echo from 06/18/2018 that revealed an LVEF of 55 to 60% (previously 60 to 65%); no RWMAs  Discuss consult with radiation oncology  Plans are for 5040 cGy over 28 fractions to left chest wall and peripheral lymphatics.   Simulation scheduled for 06/17/2018.  First treatment is scheduled for 06/25/2018.  Reviewed plans for breast reconstruction  Patient with tissue expander in place in preparation for reconstructive surgery (current volume is 300 of 375 cc capacity).  [name] planning surgery 6 months following completion of radiation therapy.  Review plans for change in therapy to Kadcyla.  Briefly re-visited previously presented study data that suggested improvement in 3-year invasive DFS (88% versus 77%; has a ratio 0.50, 95% CI 0.27 - 1.06) in patients treated with Kadcyla vs. trastuzumab.   Side effects reviewed and questions fielded.   Patient provided verbal consent to pursue change in therapy as discussed.  Labs reviewed. Blood counts stable and adequate enough for treatment. Will proceed with cycle #1 Kadcyla.  Patient has antiemetics and pain medications at home to use on a PRN basis, and advises that the prescribed  interventions are adequately managing her symptoms at this point. Continue all medications as previously prescribed.  3. Abnormal LFTs  Labs reviewed. All LFTs normal.  Continue routine monitoring. 4. Vasomotor symptoms  Improved with addition of daily venlafaxine.   Monitor for progression. No need to up titrate venlafaxine dose at this time.  5. RTC on 06/28/2018 for labs (CBC with diff, BMP) 6.   RTC on 07/11/2018 for  MD assessment, labs (CBC with diff, CMP, Mg), and cycle #2 Richrd Prime, NP  06/20/18, 8:37 AM

## 2018-06-20 NOTE — Patient Instructions (Signed)
Ado-Trastuzumab Emtansine for injection What is this medicine? ADO-TRASTUZUMAB EMTANSINE (ADD oh traz TOO zuh mab em TAN zine) is a monoclonal antibody combined with chemotherapy. It is used to treat breast cancer. This medicine may be used for other purposes; ask your health care provider or pharmacist if you have questions. COMMON BRAND NAME(S): Kadcyla What should I tell my health care provider before I take this medicine? They need to know if you have any of these conditions: -heart disease -heart failure -infection (especially a virus infection such as chickenpox, cold sores, or herpes) -liver disease -lung or breathing disease, like asthma -an unusual or allergic reaction to ado-trastuzumab emtansine, other medications, foods, dyes, or preservatives -pregnant or trying to get pregnant -breast-feeding How should I use this medicine? This medicine is for infusion into a vein. It is given by a health care professional in a hospital or clinic setting. Talk to your pediatrician regarding the use of this medicine in children. Special care may be needed. Overdosage: If you think you have taken too much of this medicine contact a poison control center or emergency room at once. NOTE: This medicine is only for you. Do not share this medicine with others. What if I miss a dose? It is important not to miss your dose. Call your doctor or health care professional if you are unable to keep an appointment. What may interact with this medicine? This medicine may also interact with the following medications: -atazanavir -boceprevir -clarithromycin -delavirdine -indinavir -dalfopristin; quinupristin -isoniazid, INH -itraconazole -ketoconazole -nefazodone -nelfinavir -ritonavir -telaprevir -telithromycin -tipranavir -voriconazole This list may not describe all possible interactions. Give your health care provider a list of all the medicines, herbs, non-prescription drugs, or dietary  supplements you use. Also tell them if you smoke, drink alcohol, or use illegal drugs. Some items may interact with your medicine. What should I watch for while using this medicine? Visit your doctor for checks on your progress. This drug may make you feel generally unwell. This is not uncommon, as chemotherapy can affect healthy cells as well as cancer cells. Report any side effects. Continue your course of treatment even though you feel ill unless your doctor tells you to stop. You may need blood work done while you are taking this medicine. Call your doctor or health care professional for advice if you get a fever, chills or sore throat, or other symptoms of a cold or flu. Do not treat yourself. This drug decreases your body's ability to fight infections. Try to avoid being around people who are sick. Be careful brushing and flossing your teeth or using a toothpick because you may get an infection or bleed more easily. If you have any dental work done, tell your dentist you are receiving this medicine. Avoid taking products that contain aspirin, acetaminophen, ibuprofen, naproxen, or ketoprofen unless instructed by your doctor. These medicines may hide a fever. Do not become pregnant while taking this medicine or for 7 months after stopping it, men with female partners should use contraception during treatment and for 4 months after the last dose. Women should inform their doctor if they wish to become pregnant or think they might be pregnant. There is a potential for serious side effects to an unborn child. Do not breast-feed an infant while taking this medicine or for 7 months after the last dose. Men who have a partner who is pregnant or who is capable of becoming pregnant should use a condom during sexual activity while taking this medicine and for   4 months after stopping it. Men should inform their doctors if they wish to father a child. This medicine may lower sperm counts. Talk to your health care  professional or pharmacist for more information. What side effects may I notice from receiving this medicine? Side effects that you should report to your doctor or health care professional as soon as possible: -allergic reactions like skin rash, itching or hives, swelling of the face, lips, or tongue -breathing problems -chest pain or palpitations -fever or chills, sore throat -general ill feeling or flu-like symptoms -light-colored stools -nausea, vomiting -pain, tingling, numbness in the hands or feet -signs and symptoms of bleeding such as bloody or black, tarry stools; red or dark-brown urine; spitting up blood or brown material that looks like coffee grounds; red spots on the skin; unusual bruising or bleeding from the eye, gums, or nose -swelling of the legs or ankles -yellowing of the eyes or skin Side effects that usually do not require medical attention (report to your doctor or health care professional if they continue or are bothersome): -changes in taste -constipation -dizziness -headache -joint pain -muscle pain -trouble sleeping -unusually weak or tired This list may not describe all possible side effects. Call your doctor for medical advice about side effects. You may report side effects to FDA at 1-800-FDA-1088. Where should I keep my medicine? This drug is given in a hospital or clinic and will not be stored at home. NOTE: This sheet is a summary. It may not cover all possible information. If you have questions about this medicine, talk to your doctor, pharmacist, or health care provider.  2019 Elsevier/Gold Standard (2015-07-26 12:11:06)  

## 2018-06-20 NOTE — Progress Notes (Signed)
Patient states her fingernails have changed (most likely taxol).  Otherwise, offers no complaints.

## 2018-06-21 ENCOUNTER — Encounter: Payer: Self-pay | Admitting: Physical Therapy

## 2018-06-21 ENCOUNTER — Ambulatory Visit: Payer: BC Managed Care – PPO | Attending: Plastic Surgery | Admitting: Physical Therapy

## 2018-06-21 DIAGNOSIS — M25512 Pain in left shoulder: Secondary | ICD-10-CM | POA: Diagnosis not present

## 2018-06-21 NOTE — Therapy (Signed)
[facility] [address], [place], [address] Phone: [phone]   Fax:  [phone]  Physical Therapy Treatment  Patient Details  Name: Denise [name] MRN: [id number] Date of Birth: 1975/12/27 Referring Provider (PT): Dillingham   Encounter Date: 06/21/2018  PT End of Session - 06/21/18 4503    Visit Number  1    Number of Visits  17      .   Past Medical History:  Diagnosis Date  . Abnormal breast biopsy    PASH, [name]  . Family history of breast cancer   . Family history of ovarian cancer   . Fibroadenoma of breast, left 2000, 2016  . Genetic screening 08/26/2013   BRCA/BART negative/Myriad, CHEK2 POS 2017  . Increased risk of breast cancer 2017   IBIS=47%  . Left breast lump 06/18/2017   Fluid/drained [name]  . Migraine   . Monoallelic mutation of CHEK2 gene in female patient 2017   increased risk of breast and colon cancer    Past Surgical History:  Procedure Laterality Date  . BREAST BIOPSY Bilateral 09/08/14   neg  . BREAST BIOPSY Left 03/16/2015   Procedure: BREAST BIOPSY WITH NEEDLE LOCALIZATION;  Surgeon: [name], MD;  Location: [facility] ORS;  Service: General;  Laterality: Left;  . BREAST CYST ASPIRATION Left 06/18/2017   cyst aspiration  . BREAST EXCISIONAL BIOPSY Left 09/2014  . BREAST EXCISIONAL BIOPSY Right    age 60's  . BREAST RECONSTRUCTION WITH PLACEMENT OF TISSUE EXPANDER AND FLEX HD (ACELLULAR HYDRATED DERMIS) Left 05/20/2018   Procedure: BREAST RECONSTRUCTION WITH PLACEMENT OF TISSUE EXPANDER AND FLEX HD (ACELLULAR HYDRATED DERMIS);  Surgeon: [name], DO;  Location: [facility] ORS;  Service: Plastics;  Laterality: Left;  . BREAST SURGERY Right March 2000   benign fibroadenoma  . BREAST SURGERY Left 08/08/13   excision  . BREAST SURGERY Left 09/23/14   excision  . MASTECTOMY W/ SENTINEL NODE BIOPSY Left 05/20/2018   Procedure: MASTECTOMY WITH SENTINEL  LYMPH NODE BIOPSY;  Surgeon: [name], MD;  Location: [facility];  Service: General;  Laterality: Left;  . PORTACATH PLACEMENT Right 12/17/2017   Procedure: INSERTION PORT-A-CATH;  Surgeon: [name], MD;  Location: [facility] ORS;  Service: General;  Laterality: Right;    There were no vitals filed for this visit.  Subjective Assessment - 06/21/18 0805    Subjective  --    Pertinent History  Patient is a 43 year old female with breast cancer history beginning in 2016 with multiple benign lumpectomies. Patient reports first malignant finding this past June 2019 in L breast, with following masectomy with saline expander placed 05/20/18. Patient reports chemo prior with radiation beginning next week 5days/week, for 6 weeks. Patient reports she is also planning to have R masectomy with saline expansion in the future, unscheduled. 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 Patient reports she is a mother of a 38 year old and 77 year old that she reports are very involved in extra-curriculars. Patient reports her ROM in the shoulder is improving, but that  she is having some pain in the inside elbow and into medial forearm with straightening. Patient reports some TTP at incision site, but most all pain at elbow into forearm. Worst pain in past week 7/10 and best 0/10.  Reports pain is sharp in the medial elbow and forearm; denies numbness or tingling  Limitations  House hold activities;Lifting    How long can you sit comfortably?  unlimited    How long can you stand comfortably?  unlimited    How long can you walk comfortably?  unlimited    Patient Stated Goals  Decrease pain    Currently in Pain?  Yes    Pain Score  0-No pain    Pain Location  Elbow    Pain Orientation  Left    Pain Descriptors / Indicators  Sharp    Pain Type  Surgical pain    Pain Radiating Towards  From elbow into L forearm    Pain Onset  1 to 4 weeks ago    Pain Frequency  Intermittent     Aggravating Factors   elbow ext with shoulder overhead, wrist movement with elbow ext    Pain Relieving Factors  rest    Effect of Pain on Daily Activities  unable to complete overhead activities          OBJECTIVE  MUSCULOSKELETAL: Tremor: Normal Bulk: Normal Tone: Normal  Cervical Screen AROM: WFL and painless with overpressure in all planes Spurlings A (ipsilateral lateral flexion/axial compression): Negative Spurlings B (ipsilateral lateral flexion/contralateral rotation/axial compression): Negative Repeated movement: No centralization or peripheralization with protraction or retraction Hoffman Sign (cervical cord compression): Negative bilat ULTT Median: R: negative  L Positive ULTT Ulnar:negative bilat ULTT Radial: R: negative bilat  Elbow Screen Elbow AROM: wnl with 2/10 pain with ext  Palpation TTP with noted tension at L lat/teres major/minor and wrist flexion Scar tissue at incision site that patient reports it tender to touch   Strength R/L 5/5 Shoulder flexion (anterior deltoid/pec major/coracobrachialis, axillary n. (C5-6) and musculocutaneous n. (C5-7)) 5/5 Shoulder abduction (deltoid/supraspinatus, axillary/suprascapular n, C5) 5/5 Shoulder external rotation (infraspinatus/teres minor) 5/5 Shoulder internal rotation (subcapularis/lats/pec major) 5/5 Shoulder extension (posterior deltoid, lats, teres major, axillary/thoracodorsal n.) 5/5 Shoulder horizontal abduction 5/5 Elbow flexion (biceps brachii, brachialis, brachioradialis, musculoskeletal n, C5-6) 5/5 Elbow extension (triceps, radial n, C7) 5/5 Wrist Extension 5/5 Wrist Flexion  AROM R/L 180/104 Shoulder flexion; L  w/ elbow bent 111 180/90 Shoulder abduction; L  w/ elbow bent 135d C8/C8 Shoulder external rotation T10/T10 Shoulder internal rotation 60/60 Shoulder extension *Indicates pain, overpressure performed unless otherwise indicated  PROM R/L 180/157 Shoulder flexion 180/108  Shoulder abduction; with elbow flex 160d 90/75 Shoulder external rotation 70/72 Shoulder internal rotation 60/60 Shoulder extension *Indicates pain, overpressure performed unless otherwise indicated  Accessory Motions/Glides Glenohumeral: Posterior: normal bilat Inferior: normal bilat  NEUROLOGICAL:  Mental Status Patient is oriented to person, place and time.  Recent memory is intact.  Remote memory is intact.  Attention span and concentration are intact.  Expressive speech is intact.  Patient's fund of knowledge is within normal limits for educational level.  Sensation Grossly intact to light touch bilateral UE as determined by testing dermatomes C2-T2 Proprioception and hot/cold testing deferred on this date  Coordination/Cerebellar Finger to nose:: Normal  Pronator drift: Normal  Finger opposition: Normal  SPECIAL TESTS Cozen's neg b/l Mill's neg b/l Maudley's neg b/l  Ther-Ex - Pulleys AAROM into flex and abd x20 with cuing for gentle stretch with 3sec hold (HEP) - sidelying lat stretch with LUE overhead 30sec hold (HEP) - Supine ER stretch 30sec hold (HEP) - Education on scar tissue massage to prevent hypersensitivity and increased scar mobility.           [facility] PT Assessment - 06/21/18 0001      Assessment  Medical Diagnosis  LUE loss of ROM post masectomy    Referring Provider (PT)  Dillingham    Onset Date/Surgical Date  05/21/19    Hand Dominance  Right    Next MD Visit  2 weeks    Prior Therapy  no      Balance Screen   Has the patient fallen in the past 6 months  No    Has the patient had a decrease in activity level because of a fear of falling?   No    Is the patient reluctant to leave their home because of a fear of falling?   No      Home Film/video editor residence    Living Arrangements  Spouse/significant other;Children    Available Help at Discharge  Family    Type of [place] to  enter    Entrance Stairs-Number of Steps  1    Entrance Stairs-Rails  None    Home Layout  Two level    Alternate Level Stairs-Number of Steps  [place]  None      Prior Function   Level of Independence  Independent    Vocation  Full time employment    Publishing rights manager, writing, reading    Leisure  Playing with 2 daughter, driving      Sensation   Light Touch  Appears Intact                           PT Education - 06/21/18 0820    Education Details  Patient was educated on diagnosis, anatomy and pathology involved, prognosis, role of PT, and was given an HEP, demonstrating exercise with proper form following verbal and tactile cues, and was given a paper hand out to continue exercise at home. Pt was educated on and agreed to plan of care    Person(s) Educated  Patient    Methods  Explanation;Demonstration;Tactile cues;Verbal cues    Comprehension  Verbalized understanding;Returned demonstration;Verbal cues required;Tactile cues required       PT Short Term Goals - 06/21/18 1143      PT SHORT TERM GOAL #1   Title  Pt will be independent with HEP in order to improve strength and motion, and to decrease pain in order to improve pain-free function at home and work.    Time  4    Period  Weeks    Status  New        PT Long Term Goals - 06/21/18 1144      PT LONG TERM GOAL #1   Title  Pt will decrease worst pain as reported on NPRS by at least 3 points in order to demonstrate clinically significant reduction in pain.    Baseline  06/20/18 7/10 with overhead motion with elbow ext    Time  8    Period  Weeks    Status  New      PT LONG TERM GOAL #2   Title  Patient will increase FOTO score to 72 to demonstrate predicted increase in functional mobility to complete ADLs    Baseline  06/20/18 58    Time  8    Period  Weeks    Status  New      PT LONG TERM GOAL #3   Title  Patient will demonstrate all active shoulder ROM within  normal limits in order to complete ADLs    Baseline  06/20/18 see eval    Time  8    Period  Weeks    Status  New            Plan - 06/21/18 1149    Clinical Impression Statement  Patient is a 43 year old female presenting post L masectomy with expander placement 05/20/18 presenting with deficits in UE ROM. Patient with current impairments in shoulder and elbow ROM (AROM and PROM), increased scar tissue, pain, and muscle tension. Patient is currently unable to complete overhead tasks or tasks with elbow ext without pain, inhibiting her ability to participate in self care ADLs or in her occupation as a Pharmacist, hospital. Would benefit from skilled PT to address above deficits and promote optimal return to PLOF    Clinical Presentation  Evolving    Clinical Presentation due to:  2 personal factors/comorbidities, 3 or more body systems/activity limitations/participation restrictions     Clinical Decision Making  Moderate    Rehab Potential  Good    Clinical Impairments Affecting Rehab Potential  (+) age, motivation, social support, (-) current sedentary lifestyle, ongoing cancer treatment, other comorbidities    PT Frequency  2x / week    PT Duration  8 weeks    PT Treatment/Interventions  ADLs/Self Care Home Management;Aquatic Therapy;Electrical Stimulation;Cryotherapy;Moist Heat;Iontophoresis 60m/ml Dexamethasone;Functional mobility training;Therapeutic activities;Therapeutic exercise;Traction;Ultrasound;Neuromuscular re-education;Manual techniques;Patient/family education;Manual lymph drainage;Passive range of motion;Dry needling;Energy conservation;Taping    PT Next Visit Plan  Ease soft tissue restrictions, increase ROM as able    PT Home Exercise Plan  pulleys shoulder abd/flex, sidelying lat stretch, supine ER stretch, scar massage    Consulted and Agree with Plan of Care  Patient       Patient will benefit from skilled therapeutic intervention in order to improve the following deficits and  impairments:  Decreased activity tolerance, Decreased endurance, Decreased range of motion, Decreased skin integrity, Hypomobility, Increased fascial restricitons, Impaired UE functional use, Improper body mechanics, Pain, Postural dysfunction, Impaired flexibility, Decreased scar mobility  Visit Diagnosis: Acute pain of left shoulder     Problem List Patient Active Problem List   Diagnosis Date Noted  . Hot flashes due to menopause 06/18/2018  . Status post left mastectomy 06/04/2018  . S/P breast reconstruction 05/28/2018  . Breast cancer ([place] 05/20/2018  . Vasomotor symptoms due to menopause 04/17/2018  . Elevated LFTs 03/17/2018  . Goals of care, counseling/discussion 03/17/2018  . Hypomagnesemia 03/04/2018  . Diarrhea 01/02/2018  . Oral candidiasis 12/31/2017  . Encounter for antineoplastic chemotherapy 12/21/2017  . Encounter for antineoplastic immunotherapy 12/21/2017  . Lymphadenopathy, axillary 12/06/2017  . 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 Benign breast cyst in female, left 06/19/2017  . Breast mass, right 12/05/2016  . 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 Monoallelic mutation of CHEK2 gene in female patient 06/20/2015  . Increased risk of breast cancer 06/20/2015  . 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 Flat epithelial atypia of breast 08/08/2013  . Fibroadenoma 07/31/2013   CShelton SilvasPT, DPT CShelton Silvas1/08/2018, 11:56 AM  [facility] [address] [place] [address]Phone: [phone]  Fax:  [phone] Name: ECALA KRUCKENBERGMRN: [id number] of Birth: [birth year]

## 2018-06-24 ENCOUNTER — Ambulatory Visit: Payer: BC Managed Care – PPO | Admitting: Physical Therapy

## 2018-06-25 ENCOUNTER — Ambulatory Visit
Admission: RE | Admit: 2018-06-25 | Discharge: 2018-06-25 | Disposition: A | Discharge status: Home / Self Care | Payer: BC Managed Care – PPO | Source: Ambulatory Visit | Attending: Radiation Oncology | Admitting: Radiation Oncology

## 2018-06-25 DIAGNOSIS — C50412 Malignant neoplasm of upper-outer quadrant of left female breast: Secondary | ICD-10-CM | POA: Insufficient documentation

## 2018-06-25 DIAGNOSIS — Z170 Estrogen receptor positive status [ER+]: Secondary | ICD-10-CM | POA: Diagnosis not present

## 2018-06-26 ENCOUNTER — Ambulatory Visit
Admission: RE | Admit: 2018-06-26 | Discharge: 2018-06-26 | Disposition: A | Discharge status: Home / Self Care | Payer: BC Managed Care – PPO | Source: Ambulatory Visit | Attending: Radiation Oncology | Admitting: Radiation Oncology

## 2018-06-26 ENCOUNTER — Ambulatory Visit: Payer: BC Managed Care – PPO | Admitting: Physical Therapy

## 2018-06-26 DIAGNOSIS — C50412 Malignant neoplasm of upper-outer quadrant of left female breast: Secondary | ICD-10-CM | POA: Diagnosis not present

## 2018-06-27 ENCOUNTER — Ambulatory Visit
Admission: RE | Admit: 2018-06-27 | Discharge: 2018-06-27 | Disposition: A | Discharge status: Home / Self Care | Payer: BC Managed Care – PPO | Source: Ambulatory Visit | Attending: Radiation Oncology | Admitting: Radiation Oncology

## 2018-06-27 DIAGNOSIS — C50412 Malignant neoplasm of upper-outer quadrant of left female breast: Secondary | ICD-10-CM | POA: Diagnosis not present

## 2018-06-28 ENCOUNTER — Ambulatory Visit
Admission: RE | Admit: 2018-06-28 | Discharge: 2018-06-28 | Disposition: A | Discharge status: Home / Self Care | Payer: BC Managed Care – PPO | Source: Ambulatory Visit | Attending: Radiation Oncology | Admitting: Radiation Oncology

## 2018-06-28 ENCOUNTER — Inpatient Hospital Stay: Payer: BC Managed Care – PPO

## 2018-06-28 DIAGNOSIS — C50412 Malignant neoplasm of upper-outer quadrant of left female breast: Secondary | ICD-10-CM | POA: Diagnosis not present

## 2018-06-28 DIAGNOSIS — N6310 Unspecified lump in the right breast, unspecified quadrant: Secondary | ICD-10-CM

## 2018-06-28 LAB — CBC WITH DIFFERENTIAL/PLATELET
Abs Immature Granulocytes: 0 10*3/uL (ref 0.00–0.07)
Basophils Absolute: 0 10*3/uL (ref 0.0–0.1)
Basophils Relative: 1 %
Eosinophils Absolute: 0.1 10*3/uL (ref 0.0–0.5)
Eosinophils Relative: 4 %
HCT: 36.8 % (ref 36.0–46.0)
Hemoglobin: 12.2 g/dL (ref 12.0–15.0)
Immature Granulocytes: 0 %
Lymphocytes Relative: 42 %
Lymphs Abs: 1.4 10*3/uL (ref 0.7–4.0)
MCH: 28.9 pg (ref 26.0–34.0)
MCHC: 33.2 g/dL (ref 30.0–36.0)
MCV: 87.2 fL (ref 80.0–100.0)
Monocytes Absolute: 0.3 10*3/uL (ref 0.1–1.0)
Monocytes Relative: 9 %
NRBC: 0 % (ref 0.0–0.2)
Neutro Abs: 1.5 10*3/uL — ABNORMAL LOW (ref 1.7–7.7)
Neutrophils Relative %: 44 %
Platelets: 117 10*3/uL — ABNORMAL LOW (ref 150–400)
RBC: 4.22 MIL/uL (ref 3.87–5.11)
RDW: 12 % (ref 11.5–15.5)
WBC: 3.4 10*3/uL — ABNORMAL LOW (ref 4.0–10.5)

## 2018-06-28 LAB — BASIC METABOLIC PANEL
Anion gap: 9 (ref 5–15)
BUN: 10 mg/dL (ref 6–20)
CO2: 29 mmol/L (ref 22–32)
Calcium: 9.4 mg/dL (ref 8.9–10.3)
Chloride: 105 mmol/L (ref 98–111)
Creatinine, Ser: 0.82 mg/dL (ref 0.44–1.00)
GFR calc Af Amer: >60 mL/min (ref >60)
GFR calc non Af Amer: >60 mL/min (ref >60)
Glucose, Bld: 103 mg/dL — ABNORMAL HIGH (ref 70–99)
Potassium: 3.4 mmol/L — ABNORMAL LOW (ref 3.5–5.1)
Sodium: 143 mmol/L (ref 135–145)

## 2018-07-01 ENCOUNTER — Ambulatory Visit
Admission: RE | Admit: 2018-07-01 | Discharge: 2018-07-01 | Disposition: A | Discharge status: Home / Self Care | Payer: BC Managed Care – PPO | Source: Ambulatory Visit | Attending: Radiation Oncology | Admitting: Radiation Oncology

## 2018-07-01 DIAGNOSIS — C50412 Malignant neoplasm of upper-outer quadrant of left female breast: Secondary | ICD-10-CM | POA: Diagnosis not present

## 2018-07-02 ENCOUNTER — Ambulatory Visit
Admission: RE | Admit: 2018-07-02 | Discharge: 2018-07-02 | Disposition: A | Discharge status: Home / Self Care | Payer: BC Managed Care – PPO | Source: Ambulatory Visit | Attending: Radiation Oncology | Admitting: Radiation Oncology

## 2018-07-02 ENCOUNTER — Encounter: Payer: Self-pay | Admitting: Plastic Surgery

## 2018-07-02 ENCOUNTER — Ambulatory Visit (INDEPENDENT_AMBULATORY_CARE_PROVIDER_SITE_OTHER): Payer: BC Managed Care – PPO | Admitting: Plastic Surgery

## 2018-07-02 VITALS — BP 132/88 | HR 95 | Temp 98.2°F | Ht 67.0 in | Wt 184.0 lb

## 2018-07-02 DIAGNOSIS — C50419 Malignant neoplasm of upper-outer quadrant of unspecified female breast: Secondary | ICD-10-CM

## 2018-07-02 DIAGNOSIS — C50412 Malignant neoplasm of upper-outer quadrant of left female breast: Secondary | ICD-10-CM | POA: Diagnosis not present

## 2018-07-02 DIAGNOSIS — Z9012 Acquired absence of left breast and nipple: Secondary | ICD-10-CM

## 2018-07-02 DIAGNOSIS — Z9889 Other specified postprocedural states: Secondary | ICD-10-CM

## 2018-07-02 DIAGNOSIS — Z9882 Breast implant status: Secondary | ICD-10-CM

## 2018-07-02 NOTE — Progress Notes (Signed)
   Subjective:    Patient ID: Denise [name], female    DOB: [birth year], 43 y.o.   MRN: [id number]  The patient is a 43 year old female who is here for follow-up on her breast reconstruction.  Her husband was at work today.  She has been doing very well with expansion.  There is no sign of infection seroma or hematoma.  She has started radiation.  She is noticing some tightness on the lateral aspect.  No skin changes in color as of yet.     Review of Systems  Constitutional: Negative.   HENT: Negative.   Eyes: Negative.   Respiratory: Negative.   Genitourinary: Negative.   Skin: Negative.        Objective:   Physical Exam Vitals signs and nursing note reviewed.  Constitutional:      Appearance: Normal appearance.  HENT:     Head: Normocephalic and atraumatic.  Neck:     Musculoskeletal: Normal range of motion.  Cardiovascular:     Rate and Rhythm: Normal rate.  Skin:    General: Skin is warm.  Neurological:     Mental Status: She is alert.  Psychiatric:        Mood and Affect: Mood normal.        Thought Content: Thought content normal.        Judgment: Judgment normal.        Assessment & Plan:  Malignant neoplasm of upper-outer quadrant of female breast, unspecified estrogen receptor status, unspecified laterality ([place])  Status post left mastectomy  S/P breast reconstruction  We placed injectable saline in the Expander using a sterile technique: Left: 50 cc for a total of 350 / 375 cc

## 2018-07-03 ENCOUNTER — Encounter: Payer: Self-pay | Admitting: Physical Therapy

## 2018-07-03 ENCOUNTER — Ambulatory Visit: Payer: BC Managed Care – PPO | Admitting: Physical Therapy

## 2018-07-03 ENCOUNTER — Ambulatory Visit
Admission: RE | Admit: 2018-07-03 | Discharge: 2018-07-03 | Disposition: A | Discharge status: Home / Self Care | Payer: BC Managed Care – PPO | Source: Ambulatory Visit | Attending: Radiation Oncology | Admitting: Radiation Oncology

## 2018-07-03 DIAGNOSIS — M25512 Pain in left shoulder: Secondary | ICD-10-CM

## 2018-07-03 DIAGNOSIS — C50412 Malignant neoplasm of upper-outer quadrant of left female breast: Secondary | ICD-10-CM | POA: Diagnosis not present

## 2018-07-03 NOTE — Therapy (Signed)
[facility] [address], [place], [address] Phone: [phone]   Fax:  [phone]  Physical Therapy Treatment  Patient Details  Name: Denise [name] MRN: [id number] Date of Birth: Feb 10, 1976 Referring Provider (PT): Dillingham   Encounter Date: 07/03/2018  PT End of Session - 07/03/18 1509    Visit Number  2    Number of Visits  17    Date for PT Re-Evaluation  08/14/18    PT Start Time  0230    PT Stop Time  0315    PT Time Calculation (min)  45 min    Activity Tolerance  Patient tolerated treatment well    Behavior During Therapy  [facility] for tasks assessed/performed       Past Medical History:  Diagnosis Date  . Abnormal breast biopsy    PASH, [name]  . Family history of breast cancer   . Family history of ovarian cancer   . Fibroadenoma of breast, left 2000, 2016  . Genetic screening 08/26/2013   BRCA/BART negative/Myriad, CHEK2 POS 2017  . Increased risk of breast cancer 2017   IBIS=47%  . Left breast lump 06/18/2017   Fluid/drained [name]  . Migraine   . Monoallelic mutation of CHEK2 gene in female patient 2017   increased risk of breast and colon cancer    Past Surgical History:  Procedure Laterality Date  . BREAST BIOPSY Bilateral 09/08/14   neg  . BREAST BIOPSY Left 03/16/2015   Procedure: BREAST BIOPSY WITH NEEDLE LOCALIZATION;  Surgeon: [name], MD;  Location: [facility] OR[facility];  Service: General;  Laterality: Left;  . BREAST CYST ASPIRATION Left 06/18/2017   cyst aspiration  . BREAST EXCISIONAL BIOPSY Left 09/2014  . BREAST EXCISIONAL BIOPSY Right    age 70's  . BREAST RECONSTRUCTION WITH PLACEMENT OF TISSUE EXPANDER AND FLEX HD (ACELLULAR HYDRATED DERMIS) Left 05/20/2018   Procedure: BREAST RECONSTRUCTION WITH PLACEMENT OF TISSUE EXPANDER AND FLEX HD (ACELLULAR HYDRATED DERMIS);  Surgeon: [name], DO;  Location: [facility];  Service: Plastics;  Laterality: Left;   . BREAST SURGERY Right March 2000   benign fibroadenoma  . BREAST SURGERY Left 08/08/13   excision  . BREAST SURGERY Left 09/23/14   excision  . MASTECTOMY W/ SENTINEL NODE BIOPSY Left 05/20/2018   Procedure: MASTECTOMY WITH SENTINEL LYMPH NODE BIOPSY;  Surgeon: [name], MD;  Location: [facility];  Service: General;  Laterality: Left;  . PORTACATH PLACEMENT Right 12/17/2017   Procedure: INSERTION PORT-A-CATH;  Surgeon: [name], MD;  Location: [facility];  Service: General;  Laterality: Right;    There were no vitals filed for this visit.  Subjective Assessment - 07/03/18 1434    Subjective  Patient reports shoulder was moving very well, but then she had injection yesterday and her motion is "stiffer" following this. Patient reports compliance with HEP with no questions or concerns. Reports most pain in medial elbow/forearm and in armpit with overhead motion. Patient reports 5/10 pain today    Pertinent History  Patient is a 43 year old female with breast cancer history beginning in 2016 with multiple benign lumpectomies. Patient reports first malignant finding this past June 2019 in L breast, with following masectomy with saline expander placed 05/20/18. Patient reports chemo prior with radiation beginning next week 5days/week, for 6 weeks. Patient reports she is also planning to have R masectomy with saline expansion in the future, unscheduled. 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 Patient reports she is a mother of a 32 year old and 21 year old that she reports are very involved in extra-curriculars. Patient reports her ROM in the shoulder is improving, but that  she is having some pain in the inside elbow and into medial forearm with straightening. Patient reports some TTP at incision site, but most all pain at elbow into forearm. Worst pain in past week 7/10 and best 0/10.  Reports pain is sharp in the medial elbow and forearm; denies numbness or  tingling    Limitations  House hold activities;Lifting    How long can you sit comfortably?  unlimited    How long can you stand comfortably?  unlimited    How long can you walk comfortably?  unlimited    Patient Stated Goals  Decrease pain    Pain Onset  1 to 4 weeks ago         Ther-Ex - Standing rows 10# 3x 10 with cuing for posture set up with good carry over following - Lat pulldown 20# x10; 25# 2x 10 with cuing initially for proper form to prevent shoulder hiking with good carry over following - Supine pec stretch 45mn  Manual - STM with trigger point release to L pec minor, wrist flexors, and latissimus/teresminor/major - Multiple bouts of PROM in all directions with patient demonstrating near full ROM following manual techniques - GHJ AP grade III mobs 30sec bouts 8 bouts with abd to increase ROM                        PT Education - 07/03/18 1505    Education Details  Exercise form    Person(s) Educated  Patient    Methods  Explanation;Demonstration;Verbal cues    Comprehension  Verbalized understanding;Returned demonstration;Verbal cues required       PT Short Term Goals - 06/21/18 1143      PT SHORT TERM GOAL #1   Title  Pt will be independent with HEP in order to improve strength and motion, and to decrease pain in order to improve pain-free function at home and work.    Time  4    Period  Weeks    Status  New        PT Long Term Goals - 06/21/18 1144      PT LONG TERM GOAL #1   Title  Pt will decrease worst pain as reported on NPRS by at least 3 points in order to demonstrate clinically significant reduction in pain.    Baseline  06/20/18 7/10 with overhead motion with elbow ext    Time  8    Period  Weeks    Status  New      PT LONG TERM GOAL #2   Title  Patient will increase FOTO score to 72 to demonstrate predicted increase in functional mobility to complete ADLs    Baseline  06/20/18 58    Time  8    Period  Weeks    Status  New       PT LONG TERM GOAL #3   Title  Patient will demonstrate all active shoulder ROM within normal limits in order to complete ADLs    Baseline  06/20/18 see eval    Time  8    Period  Weeks    Status  New            Plan -  07/03/18 1707    Clinical Impression Statement  PT utilized increased manual techniques to relieve muscle tension and increase ROM, which patient demonstrates newar full ROM following, and decreased pain to 0/10. PT utilized therex to increase motor control with activation of periscapular postural muscles, which patient is able to complete with accuracy following PT cuing.     Rehab Potential  Good    Clinical Impairments Affecting Rehab Potential  (+) age, motivation, social support, (-) current sedentary lifestyle, ongoing cancer treatment, other comorbidities    PT Frequency  2x / week    PT Duration  8 weeks    PT Treatment/Interventions  ADLs/Self Care Home Management;Aquatic Therapy;Electrical Stimulation;Cryotherapy;Moist Heat;Iontophoresis 3m/ml Dexamethasone;Functional mobility training;Therapeutic activities;Therapeutic exercise;Traction;Ultrasound;Neuromuscular re-education;Manual techniques;Patient/family education;Manual lymph drainage;Passive range of motion;Dry needling;Energy conservation;Taping    PT Next Visit Plan  Ease soft tissue restrictions, increase ROM as able    PT Home Exercise Plan  pulleys shoulder abd/flex, sidelying lat stretch, supine ER stretch, scar massage    Consulted and Agree with Plan of Care  Patient       Patient will benefit from skilled therapeutic intervention in order to improve the following deficits and impairments:  Decreased activity tolerance, Decreased endurance, Decreased range of motion, Decreased skin integrity, Hypomobility, Increased fascial restricitons, Impaired UE functional use, Improper body mechanics, Pain, Postural dysfunction, Impaired flexibility, Decreased scar mobility  Visit Diagnosis: Acute pain of  left shoulder     Problem List Patient Active Problem List   Diagnosis Date Noted  . Hot flashes due to menopause 06/18/2018  . Status post left mastectomy 06/04/2018  . S/P breast reconstruction 05/28/2018  . Breast cancer ([place] 05/20/2018  . Vasomotor symptoms due to menopause 04/17/2018  . Elevated LFTs 03/17/2018  . Goals of care, counseling/discussion 03/17/2018  . Hypomagnesemia 03/04/2018  . Diarrhea 01/02/2018  . Oral candidiasis 12/31/2017  . Encounter for antineoplastic chemotherapy 12/21/2017  . Encounter for antineoplastic immunotherapy 12/21/2017  . Lymphadenopathy, axillary 12/06/2017  . 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 Benign breast cyst in female, left 06/19/2017  . Breast mass, right 12/05/2016  . 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 Monoallelic mutation of CHEK2 gene in female patient 06/20/2015  . Increased risk of breast cancer 06/20/2015  . Malignant neoplasm of upper-outer quadrant of female breast ([place] 09/17/2014  . Flat epithelial atypia of breast 08/08/2013  . Fibroadenoma 07/31/2013   CShelton SilvasPT, DPT CShelton Silvas1/15/2020, 5:09 PM  [facility] [address] [place] [address]Phone: [phone]  Fax:  [phone] Name: ELANDRY LOOKINGBILLMRN: [id number] of Birth: 127-Aug-1977

## 2018-07-04 ENCOUNTER — Ambulatory Visit
Admission: RE | Admit: 2018-07-04 | Discharge: 2018-07-04 | Disposition: A | Discharge status: Home / Self Care | Payer: BC Managed Care – PPO | Source: Ambulatory Visit | Attending: Radiation Oncology | Admitting: Radiation Oncology

## 2018-07-04 DIAGNOSIS — C50412 Malignant neoplasm of upper-outer quadrant of left female breast: Secondary | ICD-10-CM | POA: Diagnosis not present

## 2018-07-05 ENCOUNTER — Ambulatory Visit: Payer: BC Managed Care – PPO

## 2018-07-08 ENCOUNTER — Telehealth: Payer: Self-pay

## 2018-07-08 ENCOUNTER — Ambulatory Visit
Admission: RE | Admit: 2018-07-08 | Discharge: 2018-07-08 | Disposition: A | Discharge status: Home / Self Care | Payer: BC Managed Care – PPO | Source: Ambulatory Visit | Attending: Radiation Oncology | Admitting: Radiation Oncology

## 2018-07-08 DIAGNOSIS — C50412 Malignant neoplasm of upper-outer quadrant of left female breast: Secondary | ICD-10-CM | POA: Diagnosis not present

## 2018-07-08 NOTE — Telephone Encounter (Signed)
Left Message - to inform the patient of her time and date with location and address [address]. If they have any question please feel free to contact our [place] office [phone]

## 2018-07-09 ENCOUNTER — Ambulatory Visit: Payer: BC Managed Care – PPO | Admitting: Physical Therapy

## 2018-07-09 ENCOUNTER — Ambulatory Visit
Admission: RE | Admit: 2018-07-09 | Discharge: 2018-07-09 | Disposition: A | Discharge status: Home / Self Care | Payer: BC Managed Care – PPO | Source: Ambulatory Visit | Attending: Radiation Oncology | Admitting: Radiation Oncology

## 2018-07-09 DIAGNOSIS — C50412 Malignant neoplasm of upper-outer quadrant of left female breast: Secondary | ICD-10-CM | POA: Diagnosis not present

## 2018-07-10 ENCOUNTER — Ambulatory Visit: Payer: BC Managed Care – PPO | Admitting: Physical Therapy

## 2018-07-10 ENCOUNTER — Ambulatory Visit
Admission: RE | Admit: 2018-07-10 | Discharge: 2018-07-10 | Disposition: A | Discharge status: Home / Self Care | Payer: BC Managed Care – PPO | Source: Ambulatory Visit | Attending: Radiation Oncology | Admitting: Radiation Oncology

## 2018-07-10 ENCOUNTER — Encounter: Payer: Self-pay | Admitting: Physical Therapy

## 2018-07-10 DIAGNOSIS — M25512 Pain in left shoulder: Secondary | ICD-10-CM

## 2018-07-10 DIAGNOSIS — C50412 Malignant neoplasm of upper-outer quadrant of left female breast: Secondary | ICD-10-CM | POA: Diagnosis not present

## 2018-07-10 NOTE — Therapy (Signed)
[facility] [address], [place], [address] Phone: [phone]   Fax:  [phone]  Physical Therapy Treatment  Patient Details  Name: Denise [name] MRN: [id number] Date of Birth: 06-03-1976 Referring Provider (PT): Dillingham   Encounter Date: 07/10/2018  PT End of Session - 07/10/18 0807    Visit Number  3    Number of Visits  17    Date for PT Re-Evaluation  08/14/18    PT Start Time  0730    PT Stop Time  0810    PT Time Calculation (min)  40 min    Activity Tolerance  Patient tolerated treatment well    Behavior During Therapy  [facility] for tasks assessed/performed       Past Medical History:  Diagnosis Date  . Abnormal breast biopsy    PASH, [name]  . Family history of breast cancer   . Family history of ovarian cancer   . Fibroadenoma of breast, left 2000, 2016  . Genetic screening 08/26/2013   BRCA/BART negative/Myriad, CHEK2 POS 2017  . Increased risk of breast cancer 2017   IBIS=47%  . Left breast lump 06/18/2017   Fluid/drained [name]  . Migraine   . Monoallelic mutation of CHEK2 gene in female patient 2017   increased risk of breast and colon cancer    Past Surgical History:  Procedure Laterality Date  . BREAST BIOPSY Bilateral 09/08/14   neg  . BREAST BIOPSY Left 03/16/2015   Procedure: BREAST BIOPSY WITH NEEDLE LOCALIZATION;  Surgeon: [name], MD;  Location: [facility] ORS;  Service: General;  Laterality: Left;  . BREAST CYST ASPIRATION Left 06/18/2017   cyst aspiration  . BREAST EXCISIONAL BIOPSY Left 09/2014  . BREAST EXCISIONAL BIOPSY Right    age 68's  . BREAST RECONSTRUCTION WITH PLACEMENT OF TISSUE EXPANDER AND FLEX HD (ACELLULAR HYDRATED DERMIS) Left 05/20/2018   Procedure: BREAST RECONSTRUCTION WITH PLACEMENT OF TISSUE EXPANDER AND FLEX HD (ACELLULAR HYDRATED DERMIS);  Surgeon: [name], DO;  Location: [facility] ORS;  Service: Plastics;  Laterality: Left;   . BREAST SURGERY Right March 2000   benign fibroadenoma  . BREAST SURGERY Left 08/08/13   excision  . BREAST SURGERY Left 09/23/14   excision  . MASTECTOMY W/ SENTINEL NODE BIOPSY Left 05/20/2018   Procedure: MASTECTOMY WITH SENTINEL LYMPH NODE BIOPSY;  Surgeon: [name], MD;  Location: [facility] ORS;  Service: General;  Laterality: Left;  . PORTACATH PLACEMENT Right 12/17/2017   Procedure: INSERTION PORT-A-CATH;  Surgeon: [name], MD;  Location: [facility] ORS;  Service: General;  Laterality: Right;    There were no vitals filed for this visit.  Subjective Assessment - 07/10/18 0802    Subjective  Patient reports her motion is improving, but she is having some tension in the ant shoulder, and forearm. Patient reports 3/10 pain this am, compliance with HEP no questions or concerns.     Pertinent History  Patient is a 43 year old female with breast cancer history beginning in 2016 with multiple benign lumpectomies. Patient reports first malignant finding this past June 2019 in L breast, with following masectomy with saline expander placed 05/20/18. Patient reports chemo prior with radiation beginning next week 5days/week, for 6 weeks. Patient reports she is also planning to have R masectomy with saline expansion in the future, unscheduled. 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 Patient reports she is a mother of  a 66 year old and 65 year old that she reports are very involved in extra-curriculars. Patient reports her ROM in the shoulder is improving, but that  she is having some pain in the inside elbow and into medial forearm with straightening. Patient reports some TTP at incision site, but most all pain at elbow into forearm. Worst pain in past week 7/10 and best 0/10.  Reports pain is sharp in the medial elbow and forearm; denies numbness or tingling    Limitations  House hold activities;Lifting    How long can you sit comfortably?  unlimited    How long can  you walk comfortably?  unlimited    Patient Stated Goals  Decrease pain    Pain Onset  1 to 4 weeks ago       Ther-Ex - Standing rows 15# 3x 10 with cuing for posture set up with good carry over following - Lat pulldown 25# 3x 10 with cuing initially for proper form to prevent shoulder hiking with good carry over following - Horizontal abduction at 90d of flexion with GTB 3x 10 with min cuing initially for scapular retraction with good carry over following.  - Supine pec stretch on towel roll 64mn -Wrist extensor stretch 161m hold (HEP)  Manual - STM with trigger point release to L pec minor, wrist flexors, and latissimus/teresminor/major (increased time spent on pec minor and wrist flexors) - Multiple bouts of PROM in all directions with patient demonstrating near full ROM following manual techniques - GHJ AP grade III mobs 30sec bouts 8 bouts with abd to increase ROM                        PT Education - 07/10/18 0807    Education Details  Exercise technique    Person(s) Educated  Patient    Methods  Explanation;Demonstration;Tactile cues;Verbal cues    Comprehension  Verbalized understanding;Returned demonstration;Verbal cues required;Tactile cues required       PT Short Term Goals - 06/21/18 1143      PT SHORT TERM GOAL #1   Title  Pt will be independent with HEP in order to improve strength and motion, and to decrease pain in order to improve pain-free function at home and work.    Time  4    Period  Weeks    Status  New        PT Long Term Goals - 06/21/18 1144      PT LONG TERM GOAL #1   Title  Pt will decrease worst pain as reported on NPRS by at least 3 points in order to demonstrate clinically significant reduction in pain.    Baseline  06/20/18 7/10 with overhead motion with elbow ext    Time  8    Period  Weeks    Status  New      PT LONG TERM GOAL #2   Title  Patient will increase FOTO score to 72 to demonstrate predicted increase in  functional mobility to complete ADLs    Baseline  06/20/18 58    Time  8    Period  Weeks    Status  New      PT LONG TERM GOAL #3   Title  Patient will demonstrate all active shoulder ROM within normal limits in order to complete ADLs    Baseline  06/20/18 see eval    Time  8    Period  Weeks    Status  New            Plan - 07/10/18 0820    Clinical Impression Statement  PT continued manual techniques for muscle tension, which patient reports decreased pain with noted decreased muscle tension following. PT continued therex progression for periscapular musculature, which patient is able to complete with no increased pain, only noted muscle fatigue. PT updated HEP to include stretch for wrist flexors to maintain session gains. Patient will continue to benefit from skilled PT to address muscle tension and strength deficits.     Clinical Presentation  Evolving    Clinical Decision Making  Moderate    Rehab Potential  Good    Clinical Impairments Affecting Rehab Potential  (+) age, motivation, social support, (-) current sedentary lifestyle, ongoing cancer treatment, other comorbidities    PT Frequency  2x / week    PT Duration  8 weeks    PT Treatment/Interventions  ADLs/Self Care Home Management;Aquatic Therapy;Electrical Stimulation;Cryotherapy;Moist Heat;Iontophoresis 97m/ml Dexamethasone;Functional mobility training;Therapeutic activities;Therapeutic exercise;Traction;Ultrasound;Neuromuscular re-education;Manual techniques;Patient/family education;Manual lymph drainage;Passive range of motion;Dry needling;Energy conservation;Taping    PT Next Visit Plan  Ease soft tissue restrictions, increase ROM as able    PT Home Exercise Plan  pulleys shoulder abd/flex, sidelying lat stretch, supine ER stretch, scar massage    Consulted and Agree with Plan of Care  Patient       Patient will benefit from skilled therapeutic intervention in order to improve the following deficits and impairments:   Decreased activity tolerance, Decreased endurance, Decreased range of motion, Decreased skin integrity, Hypomobility, Increased fascial restricitons, Impaired UE functional use, Improper body mechanics, Pain, Postural dysfunction, Impaired flexibility, Decreased scar mobility  Visit Diagnosis: Acute pain of left shoulder     Problem List Patient Active Problem List   Diagnosis Date Noted  . Hot flashes due to menopause 06/18/2018  . Status post left mastectomy 06/04/2018  . S/P breast reconstruction 05/28/2018  . Breast cancer ([place] 05/20/2018  . Vasomotor symptoms due to menopause 04/17/2018  . Elevated LFTs 03/17/2018  . Goals of care, counseling/discussion 03/17/2018  . Hypomagnesemia 03/04/2018  . Diarrhea 01/02/2018  . Oral candidiasis 12/31/2017  . Encounter for antineoplastic chemotherapy 12/21/2017  . Encounter for antineoplastic immunotherapy 12/21/2017  . Lymphadenopathy, axillary 12/06/2017  . 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 Benign breast cyst in female, left 06/19/2017  . Breast mass, right 12/05/2016  . 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 Monoallelic mutation of CHEK2 gene in female patient 06/20/2015  . Increased risk of breast cancer 06/20/2015  . Malignant ne[place] of upper[place]outer quadrant of [place] 09/17/2014  . Flat epithelial atypia of breast 08/08/2013  . Fibroadenoma 07/31/2013   CShelton SilvasPT, DPT CShelton Silvas1/22/2020, 8:25 AM  [facility] [address] [place] [address]Phone: [phone]  Fax:  [phone] Name: EJOSANNA HEFELMRN: [id number] of Birth: 104/16/1977

## 2018-07-11 ENCOUNTER — Inpatient Hospital Stay (HOSPITAL_BASED_OUTPATIENT_CLINIC_OR_DEPARTMENT_OTHER): Payer: BC Managed Care – PPO | Admitting: Hematology and Oncology

## 2018-07-11 ENCOUNTER — Encounter: Payer: Self-pay | Admitting: Physical Therapy

## 2018-07-11 ENCOUNTER — Inpatient Hospital Stay: Payer: BC Managed Care – PPO

## 2018-07-11 ENCOUNTER — Encounter: Payer: Self-pay | Admitting: Hematology and Oncology

## 2018-07-11 ENCOUNTER — Ambulatory Visit: Payer: BC Managed Care – PPO | Admitting: Physical Therapy

## 2018-07-11 ENCOUNTER — Ambulatory Visit
Admission: RE | Admit: 2018-07-11 | Discharge: 2018-07-11 | Disposition: A | Discharge status: Home / Self Care | Payer: BC Managed Care – PPO | Source: Ambulatory Visit | Attending: Radiation Oncology | Admitting: Radiation Oncology

## 2018-07-11 VITALS — BP 128/73 | HR 94 | Temp 97.9°F | Resp 18 | Ht 67.0 in | Wt 192.4 lb

## 2018-07-11 DIAGNOSIS — R945 Abnormal results of liver function studies: Secondary | ICD-10-CM

## 2018-07-11 DIAGNOSIS — N6310 Unspecified lump in the right breast, unspecified quadrant: Secondary | ICD-10-CM

## 2018-07-11 DIAGNOSIS — C773 Secondary and unspecified malignant neoplasm of axilla and upper limb lymph nodes: Secondary | ICD-10-CM | POA: Diagnosis not present

## 2018-07-11 DIAGNOSIS — Z170 Estrogen receptor positive status [ER+]: Secondary | ICD-10-CM

## 2018-07-11 DIAGNOSIS — C50419 Malignant neoplasm of upper-outer quadrant of unspecified female breast: Secondary | ICD-10-CM

## 2018-07-11 DIAGNOSIS — M25512 Pain in left shoulder: Secondary | ICD-10-CM

## 2018-07-11 DIAGNOSIS — C50212 Malignant neoplasm of upper-inner quadrant of left female breast: Secondary | ICD-10-CM | POA: Diagnosis not present

## 2018-07-11 DIAGNOSIS — Z5112 Encounter for antineoplastic immunotherapy: Secondary | ICD-10-CM

## 2018-07-11 DIAGNOSIS — C50412 Malignant neoplasm of upper-outer quadrant of left female breast: Secondary | ICD-10-CM

## 2018-07-11 DIAGNOSIS — Z923 Personal history of irradiation: Secondary | ICD-10-CM

## 2018-07-11 DIAGNOSIS — Z79899 Other long term (current) drug therapy: Secondary | ICD-10-CM

## 2018-07-11 LAB — CBC WITH DIFFERENTIAL/PLATELET
Abs Immature Granulocytes: 0 10*3/uL (ref 0.00–0.07)
BASOS ABS: 0 10*3/uL (ref 0.0–0.1)
Basophils Relative: 1 %
EOS ABS: 0.1 10*3/uL (ref 0.0–0.5)
EOS PCT: 2 %
HEMATOCRIT: 37.7 % (ref 36.0–46.0)
HEMOGLOBIN: 12.6 g/dL (ref 12.0–15.0)
Immature Granulocytes: 0 %
Lymphocytes Relative: 30 %
Lymphs Abs: 1.2 10*3/uL (ref 0.7–4.0)
MCH: 29.2 pg (ref 26.0–34.0)
MCHC: 33.4 g/dL (ref 30.0–36.0)
MCV: 87.3 fL (ref 80.0–100.0)
Monocytes Absolute: 0.4 10*3/uL (ref 0.1–1.0)
Monocytes Relative: 9 %
Neutro Abs: 2.3 10*3/uL (ref 1.7–7.7)
Neutrophils Relative %: 58 %
Platelets: 218 10*3/uL (ref 150–400)
RBC: 4.32 MIL/uL (ref 3.87–5.11)
RDW: 12.2 % (ref 11.5–15.5)
WBC: 4 10*3/uL (ref 4.0–10.5)
nRBC: 0 % (ref 0.0–0.2)

## 2018-07-11 LAB — COMPREHENSIVE METABOLIC PANEL
ALT: 52 U/L — AB (ref 0–44)
AST: 40 U/L (ref 15–41)
Albumin: 4.1 g/dL (ref 3.5–5.0)
Alkaline Phosphatase: 81 U/L (ref 38–126)
Anion gap: 11 (ref 5–15)
BUN: 14 mg/dL (ref 6–20)
CALCIUM: 9.2 mg/dL (ref 8.9–10.3)
CO2: 25 mmol/L (ref 22–32)
CREATININE: 0.56 mg/dL (ref 0.44–1.00)
Chloride: 105 mmol/L (ref 98–111)
GFR calc Af Amer: >60 mL/min (ref >60)
GFR calc non Af Amer: >60 mL/min (ref >60)
Glucose, Bld: 137 mg/dL — ABNORMAL HIGH (ref 70–99)
Potassium: 3.9 mmol/L (ref 3.5–5.1)
Sodium: 141 mmol/L (ref 135–145)
Total Bilirubin: 0.9 mg/dL (ref 0.3–1.2)
Total Protein: 7.2 g/dL (ref 6.5–8.1)

## 2018-07-11 LAB — PREGNANCY, URINE: Preg Test, Ur: NEGATIVE

## 2018-07-11 LAB — MAGNESIUM: Magnesium: 1.6 mg/dL — ABNORMAL LOW (ref 1.7–2.4)

## 2018-07-11 MED ORDER — ACETAMINOPHEN 325 MG PO TABS
650.0000 mg | ORAL_TABLET | Freq: Once | ORAL | Status: AC
  Administered 2018-07-11: 650 mg via ORAL
  Filled 2018-07-11: qty 2

## 2018-07-11 MED ORDER — DIPHENHYDRAMINE HCL 25 MG PO CAPS
50.0000 mg | ORAL_CAPSULE | Freq: Once | ORAL | Status: AC
  Administered 2018-07-11: 50 mg via ORAL
  Filled 2018-07-11: qty 2

## 2018-07-11 MED ORDER — MAGNESIUM OXIDE 400 MG PO TABS: 400.0000 mg | ORAL_TABLET | Freq: Every day | ORAL | 0 refills | Status: DC

## 2018-07-11 MED ORDER — HEPARIN SOD (PORK) LOCK FLUSH 100 UNIT/ML IV SOLN
500.0000 [IU] | Freq: Once | INTRAVENOUS | Status: AC | PRN
  Administered 2018-07-11: 500 [IU]
  Filled 2018-07-11: qty 5

## 2018-07-11 MED ORDER — SODIUM CHLORIDE 0.9 % IV SOLN
320.0000 mg | Freq: Once | INTRAVENOUS | Status: AC
  Administered 2018-07-11: 320 mg via INTRAVENOUS
  Filled 2018-07-11: qty 16

## 2018-07-11 MED ORDER — SODIUM CHLORIDE 0.9 % IV SOLN
Freq: Once | INTRAVENOUS | Status: AC
  Administered 2018-07-11: 11:00:00 via INTRAVENOUS
  Filled 2018-07-11: qty 250

## 2018-07-11 MED ORDER — SODIUM CHLORIDE 0.9% FLUSH
10.0000 mL | Freq: Once | INTRAVENOUS | Status: AC
  Administered 2018-07-11: 10 mL via INTRAVENOUS
  Filled 2018-07-11: qty 10

## 2018-07-11 NOTE — Progress Notes (Signed)
No new changes noted today 

## 2018-07-11 NOTE — Therapy (Signed)
[facility] [address] [place], [address] Phone: [phone]   Fax:  [phone]  Physical Therapy Treatment  Patient Details  Name: Denise [name] MRN: [id number] Date of Birth: [birth year] Referring Provider (PT): Dillingham   Encounter Date: 07/11/2018  PT End of Session - 07/11/18 0845    Visit Number  4    Number of Visits  17    Date for PT Re-Evaluation  08/14/18    PT Start Time  0815    PT Stop Time  0853    PT Time Calculation (min)  38 min    Activity Tolerance  Patient tolerated treatment well    Behavior During Therapy  [facility] for tasks assessed/performed       Past Medical History:  Diagnosis Date  . Abnormal breast biopsy    PASH, [name]  . Family history of breast cancer   . Family history of ovarian cancer   . Fibroadenoma of breast, left 2000, 2016  . Genetic screening 08/26/2013   BRCA/BART negative/Myriad, CHEK2 POS 2017  . Increased risk of breast cancer 2017   IBIS=47%  . Left breast lump 06/18/2017   Fluid/drained [name]  . Migraine   . Monoallelic mutation of CHEK2 gene in female patient 2017   increased risk of breast and colon cancer    Past Surgical History:  Procedure Laterality Date  . BREAST BIOPSY Bilateral 09/08/14   neg  . BREAST BIOPSY Left 03/16/2015   Procedure: BREAST BIOPSY WITH NEEDLE LOCALIZATION;  Surgeon: [name], MD;  Location: [facility] ORS;  Service: General;  Laterality: Left;  . BREAST CYST ASPIRATION Left 06/18/2017   cyst aspiration  . BREAST EXCISIONAL BIOPSY Left 09/2014  . BREAST EXCISIONAL BIOPSY Right    age 43's  . BREAST RECONSTRUCTION WITH PLACEMENT OF TISSUE EXPANDER AND FLEX HD (ACELLULAR HYDRATED DERMIS) Left 05/20/2018   Procedure: BREAST RECONSTRUCTION WITH PLACEMENT OF TISSUE EXPANDER AND FLEX HD (ACELLULAR HYDRATED DERMIS);  Surgeon: [name], DO;  Location: [facility];  Service: Plastics;  Laterality: Left;   . BREAST SURGERY Right March 2000   benign fibroadenoma  . BREAST SURGERY Left 08/08/13   excision  . BREAST SURGERY Left 09/23/14   excision  . MASTECTOMY W/ SENTINEL NODE BIOPSY Left 05/20/2018   Procedure: MASTECTOMY WITH SENTINEL LYMPH NODE BIOPSY;  Surgeon: [name], MD;  Location: [facility];  Service: General;  Laterality: Left;  . PORTACATH PLACEMENT Right 12/17/2017   Procedure: INSERTION PORT-A-CATH;  Surgeon: [name], MD;  Location: [facility];  Service: General;  Laterality: Right;    There were no vitals filed for this visit.  Subjective Assessment - 07/11/18 0838    Subjective  Patient reports increased motion and decreased pain from yesterday's session with some periscapular soreness. Reports minimal pain this am. Compliance with HEP with no questions or concerns.     Pertinent History  Patient is a 43 year old female with breast cancer history beginning in 2016 with multiple benign lumpectomies. Patient reports first malignant finding this past June 2019 in L breast, with following masectomy with saline expander placed 05/20/18. Patient reports chemo prior with radiation beginning next week 5days/week, for 6 weeks. Patient reports she is also planning to have R masectomy with saline expansion in the future, unscheduled. 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 Patient reports her ROM in the shoulder is improving, but that  she is having some pain in the inside elbow and into medial forearm with straightening. Patient reports some TTP at incision site, but most all pain at elbow into forearm. Worst pain in past week 7/10 and best 0/10.  Reports pain is sharp in the medial elbow and forearm; denies numbness or tingling    Limitations  House hold activities;Lifting    How long can you sit comfortably?  unlimited    How long  can you stand comfortably?  unlimited    How long can you walk comfortably?  unlimited    Patient Stated Goals  Decrease pain    Pain Onset  1 to 4 weeks ago       Ther-Ex - YTI 3x 8 in Y 10 in T and I; with max cuing initially for proper form and scap retraction with good carry over following - Scaption 3# DB in each hand with cuing initially for proper form with scapular control - Lat pulldown 25# x10; 30# 2x10 with cuing initially for proper form to prevent shoulder hiking with good carry over following -Wrist extensor stretch (HEP review)  Manual - STM withtrigger point releaseto L pec minor, wrist flexors, and latissimus/teresminor/major (increased time spent on pec minor and wrist flexors) - Multiple bouts of PROM in all directions with patient demonstrating near full ROM following manual techniques - GHJ AP grade III mobs 30sec bouts 8 bouts with abd to increase ROM                        PT Education - 07/11/18 0843    Education Details  Exercise technique    Person(s) Educated  Patient    Methods  Explanation;Demonstration;Verbal cues    Comprehension  Verbalized understanding;Returned demonstration;Verbal cues required       PT Short Term Goals - 06/21/18 1143      PT SHORT TERM GOAL #1   Title  Pt will be independent with HEP in order to improve strength and motion, and to decrease pain in order to improve pain-free function at home and work.    Time  4    Period  Weeks    Status  New        PT Long Term Goals - 06/21/18 1144      PT LONG TERM GOAL #1   Title  Pt will decrease worst pain as reported on NPRS by at least 3 points in order to demonstrate clinically significant reduction in pain.    Baseline  06/20/18 7/10 with overhead motion with elbow ext    Time  8    Period  Weeks    Status  New      PT LONG TERM GOAL #2   Title  Patient will increase FOTO score to 72 to demonstrate predicted increase in functional mobility to complete  ADLs    Baseline  06/20/18 58    Time  8    Period  Weeks    Status  New      PT LONG TERM GOAL #3   Title  Patient will demonstrate all active shoulder ROM within normal limits in order to complete ADLs    Baseline  06/20/18 see eval    Time  8    Period  Weeks    Status  New  Plan - 07/11/18 0857    Clinical Impression Statement  PT continued manual techniques to decrease tension/increase ROM with patient demonstrating improvements in shoulder ROM to near full actively. PT continued therex progression for periscapular strengthening which patient is able to complete with good form requiring min cuing for scapular rhythm with good carry over. Patient will continue to benefit from skilled PT to continue to address impairments for best return to PLOF.     Rehab Potential  Good    Clinical Impairments Affecting Rehab Potential  (+) age, motivation, social support, (-) current sedentary lifestyle, ongoing cancer treatment, other comorbidities    PT Frequency  2x / week    PT Duration  8 weeks    PT Treatment/Interventions  ADLs/Self Care Home Management;Aquatic Therapy;Electrical Stimulation;Cryotherapy;Moist Heat;Iontophoresis 54m/ml Dexamethasone;Functional mobility training;Therapeutic activities;Therapeutic exercise;Traction;Ultrasound;Neuromuscular re-education;Manual techniques;Patient/family education;Manual lymph drainage;Passive range of motion;Dry needling;Energy conservation;Taping    PT Next Visit Plan  Ease soft tissue restrictions, increase ROM as able    PT Home Exercise Plan  pulleys shoulder abd/flex, sidelying lat stretch, supine ER stretch, scar massage    Consulted and Agree with Plan of Care  Patient       Patient will benefit from skilled therapeutic intervention in order to improve the following deficits and impairments:  Decreased activity tolerance, Decreased endurance, Decreased range of motion, Decreased skin integrity, Hypomobility, Increased fascial  restricitons, Impaired UE functional use, Improper body mechanics, Pain, Postural dysfunction, Impaired flexibility, Decreased scar mobility  Visit Diagnosis: Acute pain of left shoulder     Problem List Patient Active Problem List   Diagnosis Date Noted  . Hot flashes due to menopause 06/18/2018  . Status post left mastectomy 06/04/2018  . S/P breast reconstruction 05/28/2018  . Breast cancer ([place] 05/20/2018  . Vasomotor symptoms due to menopause 04/17/2018  . Elevated LFTs 03/17/2018  . Goals of care, counseling/discussion 03/17/2018  . Hypomagnesemia 03/04/2018  . Diarrhea 01/02/2018  . Oral candidiasis 12/31/2017  . Encounter for antineoplastic chemotherapy 12/21/2017  . Encounter for antineoplastic immunotherapy 12/21/2017  . Lymphadenopathy, axillary 12/06/2017  . 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 Benign breast cyst in female, left 06/19/2017  . Breast mass, right 12/05/2016  . 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 Monoallelic mutation of CHEK2 gene in female patient 06/20/2015  . Increased risk of breast cancer 06/20/2015  . Malignant neoplasm of upper-outer quadrant of female breast ([place] 09/17/2014  . Flat epithelial atypia of breast 08/08/2013  . Fibroadenoma 07/31/2013   CShelton SilvasPT, DPT CShelton Silvas1/23/2020, 9:00 AM  [facility] [address] [place] [address]Phone: [phone]  Fax:  [phone] Name: EJOYCELYN LISKAMRN: [id number] of Birth: 1May 10, 1977

## 2018-07-11 NOTE — Progress Notes (Signed)
[facility] day:  07/11/18  Chief Complaint: Denise [name] is a 43 y.o. female with multi-focal Her2/neu + left breast cancer who is seen for assessment prior to cycle #2 Kadcyla.  HPI: The patient was last seen in the medical oncology clinic on 06/20/2018 by Honor Loh, NP.  At that time, she had minor cold symptoms.  She denied any fevers.  Exam was unremarkable. WBC was 3900 (ANC 1700).   She received cycle #1 Kadcycla on 06/20/2018.  She began breast radiation on 06/26/2018. She is scheduled to complete radiation therapy on 08/05/2018.  During the interim, patient notes some initial fatigue following her initial cycle of Kadcyla. Patient notes that at the time of her first treatment, she had some post-holiday fatigue, so she is interested to see how she does with the new cycles. Vasomotor symptoms controlled on the prescribed venlafaxine. She denies any nausea, vomiting, or changes to her bowel habits. Patient denies that she has experienced any B symptoms. She denies any interval infections.   Patient participating in post-surgical physical therapy. Her last session was just prior to arrival today. Patient notes that her LEFT shoulder is sore from therapy. Maintains full AROM. She complains of being HYPERemotional over the course of the last few weeks. Patient advising that she is crying over things that normally would not affect her.   Patient advises that she maintains an adequate appetite. She is eating well. Weight today is 192 lb 5.6 oz (87.2 kg), which compared to her last visit to the clinic, represents an 8 pound increase.   Patient denies pain in the clinic today.    Past Medical History:  Diagnosis Date  . Abnormal breast biopsy    [facility], [name]  . Family history of breast cancer   . Family history of ovarian cancer   . Fibroadenoma of breast, left 2000, 2016  . Genetic screening 08/26/2013   BRCA/BART negative/[facility],  CHEK2 POS 2017  . Increased risk of breast cancer 2017   IBIS=47%  . Left breast lump 06/18/2017   Fluid/drained [name]  . Migraine   . Monoallelic mutation of CHEK2 gene in female patient 2017   increased risk of breast and colon cancer    Past Surgical History:  Procedure Laterality Date  . BREAST BIOPSY Bilateral 09/08/14   neg  . BREAST BIOPSY Left 03/16/2015   Procedure: BREAST BIOPSY WITH NEEDLE LOCALIZATION;  Surgeon: [name], MD;  Location: [facility] ORS;  Service: General;  Laterality: Left;  . BREAST CYST ASPIRATION Left 06/18/2017   cyst aspiration  . BREAST EXCISIONAL BIOPSY Left 09/2014  . BREAST EXCISIONAL BIOPSY Right    age 89's  . BREAST RECONSTRUCTION WITH PLACEMENT OF TISSUE EXPANDER AND FLEX HD (ACELLULAR HYDRATED DERMIS) Left 05/20/2018   Procedure: BREAST RECONSTRUCTION WITH PLACEMENT OF TISSUE EXPANDER AND FLEX HD (ACELLULAR HYDRATED DERMIS);  Surgeon: [name], DO;  Location: [facility] OR[facility];  Service: Plastics;  Laterality: Left;  . BREAST SURGERY Right March 2000   benign fibroadenoma  . BREAST SURGERY Left 08/08/13   excision  . BREAST SURGERY Left 09/23/14   excision  . MASTECTOMY W/ SENTINEL NODE BIOPSY Left 05/20/2018   Procedure: MASTECTOMY WITH SENTINEL LYMPH NODE BIOPSY;  Surgeon: [name], MD;  Location: [facility];  Service: General;  Laterality: Left;  . PORTACATH PLACEMENT Right 12/17/2017   Procedure: INSERTION PORT-A-CATH;  Surgeon: [name], MD;  Location: [facility] OR[facility];  Service:  General;  Laterality: Right;    Family History  Problem Relation Age of Onset  . Prostate cancer Father 84  . Breast cancer Paternal Grandmother 33  . Breast cancer Maternal Aunt 60  . Ovarian cancer Maternal Aunt 70  . Breast cancer Paternal Aunt 67  . Pancreatic cancer Maternal Aunt 75    Social History:  reports that she has never smoked. She has never used smokeless tobacco. She reports current alcohol use. She reports that she does not  use drugs.  She does not smoke. She "occasionally" drinks alcohol. 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 Patient denies known exposures to radiation on toxins. Her husband's name is [name]. She has 2 daughters, [name] and Denise [name] (ages 60 1/2 and 91).  Her husband's name is [name].  The patient is accompanied by her husband today.  Allergies:  Allergies  Allergen Reactions  . Steri-Strip Compound Benzoin [Benzoin Compound]     Steri-strip ,   . Tape Other (See Comments)    Minor skin irritation     Current Medications: Current Outpatient Medications  Medication Sig Dispense Refill  . Chlorphen-Pseudoephed-APAP (CORICIDIN D PO) Take by mouth.    Marland Kitchen omeprazole (PRILOSEC OTC) 20 MG tablet Take 20 mg by mouth daily as needed (for acid reflux).    . venlafaxine XR (EFFEXOR-XR) 37.5 MG 24 hr capsule Take 1 capsule (37.5 mg total) by mouth daily with breakfast. 30 capsule 2  . acetaminophen (TYLENOL) 500 MG tablet Take 500 mg by mouth every 6 (six) hours as needed for moderate pain or headache.    . diazepam (VALIUM) 2 MG tablet Take 2 mg by mouth every 6 (six) hours as needed for anxiety.    Marland Kitchen HYDROcodone-acetaminophen (NORCO) 5-325 MG tablet Take 1 tablet by mouth every 8 (eight) hours as needed for moderate pain. 20 tablet 0  . magnesium oxide (MAG-OX) 400 MG tablet Take 1 tablet (400 mg total) by mouth daily. 30 tablet 0   No current facility-administered medications for this visit.    Facility-Administered Medications Ordered in Other Visits  Medication Dose Route Frequency Provider Last Rate Last Dose  . sodium chloride flush (NS) 0.9 % injection 10 mL  10 mL Intravenous PRN Lequita Asal, MD   10 mL at 04/17/18 0844    Review of Systems  Constitutional: Negative.  Negative for chills, diaphoresis, fever, malaise/fatigue and weight loss (up 8 pounds).  HENT: Negative for congestion, ear discharge, ear pain, nosebleeds, sinus pain and sore throat.   Eyes:  Negative.  Negative for blurred vision, double vision and photophobia.  Respiratory: Negative.  Negative for cough, hemoptysis, sputum production and shortness of breath.   Cardiovascular: Negative.  Negative for chest pain, palpitations, orthopnea, leg swelling and PND.  Gastrointestinal: Negative.  Negative for abdominal pain, blood in stool, constipation, diarrhea, melena, nausea and vomiting.  Genitourinary: Negative.  Negative for dysuria, frequency, hematuria and urgency.  Musculoskeletal: Negative for back pain, falls, joint pain (left shoulder sore) and myalgias.  Skin: Negative.  Negative for itching and rash.  Neurological: Negative.  Negative for dizziness, tremors, speech change, focal weakness, weakness and headaches.  Endo/Heme/Allergies: Does not bruise/bleed easily.       Diabetes. Vasomotor symptoms, controlled.   Psychiatric/Behavioral: Negative for depression and memory loss. The patient is not nervous/anxious and does not have insomnia.        Emotional.  All other systems reviewed and are negative.  Performance status (ECOG):  1  Vital Signs BP 128/73 (BP Location: Right Arm, Patient Position: Sitting)   Pulse 94   Temp 97.9 F (36.6 C) (Tympanic)   Resp 18   Ht 5' 7"  (1.702 m)   Wt 192 lb 5.6 oz (87.2 kg)   SpO2 100%   BMI 30.13 kg/m   Physical Exam  Constitutional: She is oriented to person, place, and time and well-developed, well-nourished, and in no distress. No distress.  HENT:  Head: Normocephalic and atraumatic. 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  Mouth/Throat: Oropharynx is clear and moist and mucous membranes are normal. No oropharyngeal exudate.  Wearing a scarf.  Eyes: Pupils are equal, round, and reactive to light. Conjunctivae and EOM are normal. No scleral icterus.  Neck: Normal range of motion. Neck supple. No JVD present.  Cardiovascular: Normal rate, regular rhythm, normal heart sounds and intact distal pulses. Exam reveals no  gallop and no friction rub.  No murmur heard. Pulmonary/Chest: Effort normal and breath sounds normal. No respiratory distress. She has no wheezes. She has no rales.  Abdominal: Soft. Bowel sounds are normal. She exhibits no distension and no mass. There is no abdominal tenderness. There is no rebound and no guarding.  Musculoskeletal: Normal range of motion.        General: No tenderness or edema.  Lymphadenopathy:    She has no cervical adenopathy.    She has no axillary adenopathy.       Right: No inguinal and no supraclavicular adenopathy present.       Left: No inguinal and no supraclavicular adenopathy present.  Neurological: She is alert and oriented to person, place, and time. Gait normal.  Skin: Skin is warm and dry. No rash noted. She is not diaphoretic. No erythema. No pallor.  Psychiatric: Mood, affect and judgment normal.  Nursing note and vitals reviewed.   Infusion on 07/11/2018  Component Date Value Ref Range Status  . Sodium 07/11/2018 141  135 - 145 mmol/L Final  . Potassium 07/11/2018 3.9  3.5 - 5.1 mmol/L Final  . Chloride 07/11/2018 105  98 - 111 mmol/L Final  . CO2 07/11/2018 25  22 - 32 mmol/L Final  . Glucose, Bld 07/11/2018 137* 70 - 99 mg/dL Final  . BUN 07/11/2018 14  6 - 20 mg/dL Final  . Creatinine, Ser 07/11/2018 0.56  0.44 - 1.00 mg/dL Final  . Calcium 07/11/2018 9.2  8.9 - 10.3 mg/dL Final  . Total Protein 07/11/2018 7.2  6.5 - 8.1 g/dL Final  . Albumin 07/11/2018 4.1  3.5 - 5.0 g/dL Final  . AST 07/11/2018 40  15 - 41 U/L Final  . ALT 07/11/2018 52* 0 - 44 U/L Final  . Alkaline Phosphatase 07/11/2018 81  38 - 126 U/L Final  . Total Bilirubin 07/11/2018 0.9  0.3 - 1.2 mg/dL Final  . GFR calc non Af Amer 07/11/2018 >60  >60 mL/min Final  . GFR calc Af Amer 07/11/2018 >60  >60 mL/min Final  . Anion gap 07/11/2018 11  5 - 15 Final   Performed at [facility], [address], [place], [place] [address]  . Magnesium 07/11/2018 1.6* 1.7 -  2.4 mg/dL Final   Performed at [facility], [address] [place], [place] [address]  . WBC 07/11/2018 4.0  4.0 - 10.5 K/uL Final  . RBC 07/11/2018 4.32  3.87 - 5.11 MIL/uL Final  . Hemoglobin 07/11/2018 12.6  12.0 - 15.0 g/dL Final  . HCT 07/11/2018 37.7  36.0 - 46.0 % Final  . MCV 07/11/2018 87.3  80.0 - 100.0 fL Final  . MCH 07/11/2018 29.2  26.0 - 34.0 pg Final  . MCHC 07/11/2018 33.4  30.0 - 36.0 g/dL Final  . RDW 07/11/2018 12.2  11.5 - 15.5 % Final  . Platelets 07/11/2018 218  150 - 400 K/uL Final  . nRBC 07/11/2018 0.0  0.0 - 0.2 % Final  . Neutrophils Relative % 07/11/2018 58  % Final  . Neutro Abs 07/11/2018 2.3  1.7 - 7.7 K/uL Final  . Lymphocytes Relative 07/11/2018 30  % Final  . Lymphs Abs 07/11/2018 1.2  0.7 - 4.0 K/uL Final  . Monocytes Relative 07/11/2018 9  % Final  . Monocytes Absolute 07/11/2018 0.4  0.1 - 1.0 K/uL Final  . Eosinophils Relative 07/11/2018 2  % Final  . Eosinophils Absolute 07/11/2018 0.1  0.0 - 0.5 K/uL Final  . Basophils Relative 07/11/2018 1  % Final  . Basophils Absolute 07/11/2018 0.0  0.0 - 0.1 K/uL Final  . Immature Granulocytes 07/11/2018 0  % Final  . Abs Immature Granulocytes 07/11/2018 0.00  0.00 - 0.07 K/uL Final   Performed at [facility], [address], [place], [place] [address]  Office Visit on 07/11/2018  Component Date Value Ref Range Status  . Preg Test, Ur 07/11/2018 NEGATIVE  NEGATIVE Final   Performed at [facility], [address], [place], [place] [address]    Assessment:  KAESHA KIRSCH is a 43 y.o. 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  Pathology revealed no residual carcinoma in the breast.  One of three sentinel lymph nodes were positive.  One lymph node had 2 metastatic foci (2.1 mm and 1 mm).  Pathologic stage revealed ypT0 ypN1a.  Index breast mass  and axillary node biopsy on 12/06/2017 revealed grade II invasive ductal carcinoma with calcifications at the 11 o'clock position.  There was metastatic carcinoma in 1 of 1 lymph nodes.  Tumor was ER + (100%), PR + (100%), and Ki67 5%.  Her2/neu was 3+ by IHC (heterogeneous) and FISH +.  Diagnostic left mammogram and ultrasound on 12/06/2017 revealed a 1.7 x 1.3 x 1.3 cm irregular hypoechoic mass with internal calcifications at the 11 o'clock position 2 cm from the nipple with internal calcifications. There was a similar appearing 0.9 x 0.8 x 0.7 cm mass at the 11 o'clock position 4 cm from the nipple (2.4 cm from the index lesion).  There were suspicious, segmental calcifications originating from the index lesion and extending 5 cm posteriorly.  There was a 0.6 cm morphologically abnormal left axillary lymph node.  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 The adjacent and more superiorly located 11 o'clock mass (7 x 12 mm) seen on recent ultrasound, 4 cm from the nipple on ultrasound, was also identified. There were 2 probable satellite lesions (7 mm, medial lesion; posterior and lateral lesion). The total span of malignancy was 3 x 3.1 x 4.5 cm.  There were calcifications extending up to 5 cm posterior to the biopsied malignancy which were highly suspicious on mammography but not appreciated.  There was a mass in the lower outer left breast measured 5 mm, representing a change.  The known metastatic node in  the left axilla was identified. A node along the posterolateral margin of the pectoralis minor (cortex 5 mm) was nonspecific but at least somewhat suspicious.  Chest, abdomen, and pelvic CT on 12/19/2017 revealed a solitary enlarged biopsy-proven metastatic left axillary lymph node.  There were no additional findings suspicious for metastatic disease in the chest, abdomen or pelvis.   There was a nonspecific tiny sclerotic upper right sacral lesion, more likely a benign bone island. Bone scintigraphy correlation versus follow-up CT could be considered.  Bone scan on 01/11/2018 was negative for metastatic pattern. No areas of focal tracer uptake. Area of concern in sacrum on CT likely represents benign bone island.   [facility] genetic testing on 08/26/2013 was negative for BRCA1 and 2. 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  She has a family history significant for breast, ovarian, pancreatic, and prostate cancer.  She received 6 cycles of TCHP chemotherapy (12/21/2017 - 04/17/2018) with Margarette Canada support. Cycle #3 was held due to elevated LFTs on 02/04/2018. She received Herceptin + Perjeta alone (04/30/2018 - 05/30/2018).  She is s/p cycle #1 Kadcyla (06/20/2018).  She began breast radiation on 06/26/2018  Echocardiograms:  12/20/2017 - EF of 60-65%, 03/27/2018 - EF of 60-65%, and 06/18/2018 - EF of 55-60%.  She has a history of elevated LFTs.  Hepatitis B and C serologies were negative on 02/04/2018.  RUQ ultrasound on 02/06/2018 revealed a small anterior gallbladder wall polyp. There was no evidence of cholelithiasis or cholecystis. CBD measured normal at 2 mm, with no evidence of choledocholithiasis. There was normal direction of blood flow towards the liver noted.  Symptomatically, she is doing well.  Vasomotor symptoms are controlled. She is fatigued the day after treatment.  Exam is stable.  Plan: 1. Labs:  CBC with diff, CMP, Mg, urine pregnancy 2. Stage IB multifocal LEFT breast cancer Patient undergoing radiation. Plans for breast reconstruction approximately 6 months following completion of radiation therapy. Tolerated cycle #1 Kadcyla.  Notes fatigue the day after treatment. Cycle #2 Kadcyla today. Discuss symptom management.  She has antiemetics and pain medications at home to use on a prn bases.  Interventions are adequate.     3. Abnormal LFTs Labs reviewed. AST slightly elevated at 52. Continue monitoring with each cycle. 4. Vasomotor symptoms Symptoms stable. Continue to monitor. 5. Hypomagnesemia   Magnesium 1.6.   Rx for magnesium oxide 400 mg po q day. 6.   RTC on 08/01/2018 for MD assessment, labs (CBC with diff, CMP, Mg, urine pregnancy), and cycle #3 Kadcyla.   Lequita Asal, MD  07/11/18, 4:35 PM

## 2018-07-12 ENCOUNTER — Ambulatory Visit
Admission: RE | Admit: 2018-07-12 | Discharge: 2018-07-12 | Disposition: A | Discharge status: Home / Self Care | Payer: BC Managed Care – PPO | Source: Ambulatory Visit | Attending: Radiation Oncology | Admitting: Radiation Oncology

## 2018-07-12 ENCOUNTER — Ambulatory Visit: Payer: BC Managed Care – PPO

## 2018-07-12 DIAGNOSIS — C50412 Malignant neoplasm of upper-outer quadrant of left female breast: Secondary | ICD-10-CM | POA: Diagnosis not present

## 2018-07-15 ENCOUNTER — Ambulatory Visit: Payer: BC Managed Care – PPO | Admitting: Physical Therapy

## 2018-07-15 ENCOUNTER — Ambulatory Visit
Admission: RE | Admit: 2018-07-15 | Discharge: 2018-07-15 | Disposition: A | Discharge status: Home / Self Care | Payer: BC Managed Care – PPO | Source: Ambulatory Visit | Attending: Radiation Oncology | Admitting: Radiation Oncology

## 2018-07-15 DIAGNOSIS — C50412 Malignant neoplasm of upper-outer quadrant of left female breast: Secondary | ICD-10-CM | POA: Diagnosis not present

## 2018-07-16 ENCOUNTER — Ambulatory Visit
Admission: RE | Admit: 2018-07-16 | Discharge: 2018-07-16 | Disposition: A | Discharge status: Home / Self Care | Payer: BC Managed Care – PPO | Source: Ambulatory Visit | Attending: Radiation Oncology | Admitting: Radiation Oncology

## 2018-07-16 ENCOUNTER — Ambulatory Visit: Payer: BC Managed Care – PPO | Admitting: Physician Assistant

## 2018-07-16 DIAGNOSIS — C50412 Malignant neoplasm of upper-outer quadrant of left female breast: Secondary | ICD-10-CM | POA: Diagnosis not present

## 2018-07-17 ENCOUNTER — Ambulatory Visit: Payer: BC Managed Care – PPO | Admitting: Physical Therapy

## 2018-07-17 ENCOUNTER — Ambulatory Visit
Admission: RE | Admit: 2018-07-17 | Discharge: 2018-07-17 | Disposition: A | Discharge status: Home / Self Care | Payer: BC Managed Care – PPO | Source: Ambulatory Visit | Attending: Radiation Oncology | Admitting: Radiation Oncology

## 2018-07-17 ENCOUNTER — Encounter: Payer: BC Managed Care – PPO | Admitting: Physical Therapy

## 2018-07-17 ENCOUNTER — Encounter: Payer: Self-pay | Admitting: Urgent Care

## 2018-07-17 DIAGNOSIS — C50412 Malignant neoplasm of upper-outer quadrant of left female breast: Secondary | ICD-10-CM | POA: Diagnosis not present

## 2018-07-18 ENCOUNTER — Ambulatory Visit
Admission: RE | Admit: 2018-07-18 | Discharge: 2018-07-18 | Disposition: A | Discharge status: Home / Self Care | Payer: BC Managed Care – PPO | Source: Ambulatory Visit | Attending: Radiation Oncology | Admitting: Radiation Oncology

## 2018-07-18 DIAGNOSIS — C50412 Malignant neoplasm of upper-outer quadrant of left female breast: Secondary | ICD-10-CM | POA: Diagnosis not present

## 2018-07-19 ENCOUNTER — Other Ambulatory Visit: Payer: Self-pay

## 2018-07-19 ENCOUNTER — Encounter: Payer: Self-pay | Admitting: Physician Assistant

## 2018-07-19 ENCOUNTER — Encounter: Payer: Self-pay | Admitting: Oncology

## 2018-07-19 ENCOUNTER — Ambulatory Visit (INDEPENDENT_AMBULATORY_CARE_PROVIDER_SITE_OTHER): Payer: BC Managed Care – PPO | Admitting: Plastic Surgery

## 2018-07-19 ENCOUNTER — Inpatient Hospital Stay (HOSPITAL_BASED_OUTPATIENT_CLINIC_OR_DEPARTMENT_OTHER): Payer: BC Managed Care – PPO | Admitting: Oncology

## 2018-07-19 ENCOUNTER — Ambulatory Visit
Admission: RE | Admit: 2018-07-19 | Discharge: 2018-07-19 | Disposition: A | Discharge status: Home / Self Care | Payer: BC Managed Care – PPO | Source: Ambulatory Visit | Attending: Radiation Oncology | Admitting: Radiation Oncology

## 2018-07-19 VITALS — BP 159/103 | HR 96 | Temp 97.5°F | Resp 20 | Ht 67.0 in | Wt 180.6 lb

## 2018-07-19 VITALS — BP 142/84 | HR 89 | Temp 99.3°F | Ht 67.0 in | Wt 180.0 lb

## 2018-07-19 DIAGNOSIS — C773 Secondary and unspecified malignant neoplasm of axilla and upper limb lymph nodes: Secondary | ICD-10-CM

## 2018-07-19 DIAGNOSIS — Z170 Estrogen receptor positive status [ER+]: Secondary | ICD-10-CM | POA: Diagnosis not present

## 2018-07-19 DIAGNOSIS — Z79899 Other long term (current) drug therapy: Secondary | ICD-10-CM

## 2018-07-19 DIAGNOSIS — C50412 Malignant neoplasm of upper-outer quadrant of left female breast: Secondary | ICD-10-CM | POA: Diagnosis not present

## 2018-07-19 DIAGNOSIS — Z9012 Acquired absence of left breast and nipple: Secondary | ICD-10-CM

## 2018-07-19 DIAGNOSIS — M791 Myalgia, unspecified site: Secondary | ICD-10-CM | POA: Diagnosis not present

## 2018-07-19 DIAGNOSIS — Z9889 Other specified postprocedural states: Secondary | ICD-10-CM

## 2018-07-19 DIAGNOSIS — C50912 Malignant neoplasm of unspecified site of left female breast: Secondary | ICD-10-CM

## 2018-07-19 DIAGNOSIS — Z9882 Breast implant status: Secondary | ICD-10-CM

## 2018-07-19 LAB — INFLUENZA PANEL BY PCR (TYPE A & B)
Influenza A By PCR: NEGATIVE
Influenza B By PCR: NEGATIVE

## 2018-07-19 NOTE — Progress Notes (Signed)
   Subjective:    Patient ID: Denise [name], female    DOB: [birth year], 43 y.o.   MRN: [id number]  The patient is a 43 year old female with left breast cancer treated with mastectomy.  She had immediate breast reconstruction with expander and Flex HD.  She is in the process of being expanded.  She has started radiation.  We will not be able to expand anymore until the radiation therapy has ended.  Her skin is soft.  No dramatic change from the radiation yet.  No sign of hematoma or seroma.  Incision is well-healed.     Review of Systems  Constitutional: Negative for activity change and appetite change.  HENT: Negative.   Eyes: Negative.   Respiratory: Negative.   Gastrointestinal: Negative.   Genitourinary: Negative.   Musculoskeletal: Negative.   Skin: Negative for color change and wound.       Objective:   Physical Exam Vitals signs and nursing note reviewed.  Constitutional:      Appearance: Normal appearance.  HENT:     Head: Normocephalic and atraumatic.  Cardiovascular:     Rate and Rhythm: Normal rate.  Pulmonary:     Effort: Pulmonary effort is normal.  Neurological:     Mental Status: She is alert.  Psychiatric:        Mood and Affect: Mood normal.        Thought Content: Thought content normal.        Judgment: Judgment normal.       Assessment & Plan:  Status post left mastectomy  S/P breast reconstruction  Malignant neoplasm of left female breast, unspecified estrogen receptor status, unspecified site of breast ([place]) Continue healthy eating. No fill today. Follow up after radiation.

## 2018-07-19 NOTE — Progress Notes (Signed)
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 Mike Gip on 07/11/2018 prior to cycle #2 Kadcyla.  She admitted to mild fatigue post first cycle of Kadcyla.  She continued to participate in postsurgical physical therapy.  Noted to be emotional.  She maintained an adequate appetite.  She gained 8 pounds.  She denied pain.  She was evaluated by Dr. Elisabeth Cara with plastic surgery this morning for follow-up and progress with breast reconstruction with expander and Flex HD.  She continues to be in the process of being expanded.  This cannot be completed until radiation has ended.   Final treatment for radiation is scheduled for 08/05/2018.    Oncology History   Denise [name] is a 43 y.o. 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  Pathology revealed no residual carcinoma in the breast.  One of three sentinel lymph nodes were positive.  One lymph node had 2 metastatic foci (2.1 mm and 1 mm).  Pathologic stage revealed ypT0 ypN1a.  Index breast mass and axillary node biopsy on 12/06/2017 revealed grade II invasive ductal carcinoma with calcifications at the 11 o'clock position.  There was metastatic carcinoma in 1 of 1 lymph nodes.  Tumor was ER + (100%), PR + (100%), and Ki67 5%.   Her2/neu was 3+ by IHC (heterogeneous) and FISH +.  Diagnostic left mammogram and ultrasound on 12/06/2017 revealed a 1.7 x 1.3 x 1.3 cm irregular hypoechoic mass with internal calcifications at the 11 o'clock position 2 cm from the nipple with internal calcifications. There was a similar appearing 0.9 x 0.8 x 0.7 cm mass at the 11 o'clock position 4 cm from the nipple (2.4 cm from the index lesion).  There were suspicious, segmental calcifications originating from the index lesion and extending 5 cm posteriorly.  There was a 0.6 cm morphologically abnormal left axillary lymph node.  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 The adjacent and more superiorly located 11 o'clock mass (7 x 12 mm) seen on recent ultrasound, 4 cm from the nipple on ultrasound, was also identified. There were 2 probable satellite lesions (7 mm, medial lesion; posterior and lateral lesion). The total span of malignancy was 3 x 3.1 x 4.5 cm.  There were calcifications extending up to 5 cm posterior to the biopsied malignancy which were highly suspicious on mammography but not appreciated.  There was a mass in the lower outer left breast measured 5 mm, representing a change.  The known metastatic node in the left axilla was identified. A node along the posterolateral margin of the pectoralis minor (cortex 5 mm) was nonspecific but at least somewhat suspicious.  Chest, abdomen, and pelvic CT on 12/19/2017 revealed a solitary enlarged biopsy-proven metastatic left axillary lymph node.  There were no additional  findings suspicious for metastatic disease in the chest, abdomen or pelvis.  There was a nonspecific tiny sclerotic upper right sacral lesion, more likely a benign bone island. Bone scintigraphy correlation versus follow-up CT could be considered.  Bone scan on 01/11/2018 was negative for metastatic pattern.  No areas of focal tracer uptake. Area of concern in sacrum on CT likely represents benign bone island.   Myriad genetic testing on 08/26/2013 was negative for BRCA1 and 2. 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  She has a family history significant for breast, ovarian, pancreatic, and prostate cancer.  She received 6 cycles of TCHP chemotherapy (12/21/2017 - 04/17/2018) with Margarette Canada support. Cycle #3 was held due to elevated LFTs on 02/04/2018. She received Herceptin + Perjeta alone (04/30/2018 - 05/30/2018).  She is s/p cycle #1 Kadcyla (06/20/2018).  She began breast radiation on 06/25/2018  Echocardiograms:  12/20/2017 - EF of 60-65%, 03/27/2018 - EF of 60-65%, and 06/18/2018 - EF of 55-60%.  She has a history of elevated LFTs.  Hepatitis B and C serologies were negative on 02/04/2018.  RUQ ultrasound on 02/06/2018 revealed a small anterior gallbladder wall polyp. There was no evidence of cholelithiasis or cholecystis. CBD measured normal at 2 mm, with no evidence of choledocholithiasis. There was normal direction of blood flow towards the liver noted.     Malignant neoplasm of upper-outer quadrant of female breast ([place])   09/17/2014 Initial Diagnosis    Malignant neoplasm of upper-outer quadrant of female breast ([place])    12/16/2017 - 05/06/2018 Chemotherapy    The patient had palonosetron (ALOXI) injection 0.25 mg, 0.25 mg, Intravenous,  Once, 6 of 6 cycles Administration: 0.25 mg (12/21/2017), 0.25 mg (01/14/2018), 0.25 mg (03/28/2018), 0.25 mg (04/17/2018), 0.25 mg (02/11/2018), 0.25 mg (03/07/2018) pegfilgrastim (NEULASTA ONPRO KIT) injection 6 mg, 6 mg, Subcutaneous, Once, 1 of 1 cycle Administration: 6 mg (12/21/2017) pegfilgrastim-cbqv (UDENYCA) injection 6 mg, 6 mg, Subcutaneous, Once, 6 of 6 cycles Administration: 6 mg (01/15/2018), 6 mg (03/29/2018), 6 mg (04/18/2018), 6 mg (02/12/2018), 6 mg (03/08/2018) trastuzumab (HERCEPTIN) 600 mg in sodium chloride  0.9 % 250 mL chemo infusion, 672 mg, Intravenous,  Once, 6 of 6 cycles Administration: 600 mg (12/21/2017), 450 mg (01/14/2018), 500 mg (03/28/2018), 500 mg (04/17/2018), 500 mg (02/11/2018), 500 mg (03/07/2018) CARBOplatin (PARAPLATIN) 870 mg in sodium chloride 0.9 % 500 mL chemo infusion, 890 mg (100 % of original dose 885 mg), Intravenous,  Once, 6 of 6 cycles Dose modification:   (original dose 885 mg, Cycle 1),   (original dose 885 mg, Cycle 2),   (original dose 885 mg, Cycle 5),   (original dose 885 mg, Cycle 6),   (original dose 885 mg, Cycle 3),   (original dose 885 mg, Cycle 4) Administration: 870 mg (12/21/2017), 870 mg (01/14/2018), 870 mg (03/28/2018), 870 mg (04/17/2018), 870 mg (02/11/2018), 870 mg (03/07/2018) DOCEtaxel (TAXOTERE) 150 mg in sodium chloride 0.9 % 250 mL chemo infusion, 75 mg/m2 = 150 mg, Intravenous,  Once, 6 of 6 cycles Dose modification: 55 mg/m2 (original dose 75 mg/m2, Cycle 5, Reason: Change in LFTs, Comment: per pharmacy) Administration: 150 mg (12/21/2017), 150 mg (01/14/2018), 110 mg (03/28/2018), 150 mg (04/17/2018), 150 mg (02/11/2018), 150 mg (03/07/2018) pertuzumab (PERJETA) 840 mg in sodium chloride 0.9 % 250 mL chemo infusion, 840 mg, Intravenous, Once, 6 of 6 cycles Administration: 840 mg (12/21/2017), 420 mg (01/14/2018), 420 mg (03/28/2018), 420 mg (04/17/2018), 420 mg (02/11/2018), 420 mg (03/07/2018) fosaprepitant (EMEND) 150 mg, dexamethasone (  DECADRON) 12 mg in sodium chloride 0.9 % 145 mL IVPB, , Intravenous,  Once, 6 of 6 cycles Administration:  (12/21/2017),  (01/14/2018),  (03/28/2018),  (04/17/2018),  (02/11/2018),  (03/07/2018)  for chemotherapy treatment.     05/09/2018 - 06/19/2018 Chemotherapy    The patient had trastuzumab (HERCEPTIN) 500 mg in sodium chloride 0.9 % 250 mL chemo infusion, 504 mg, Intravenous,  Once, 2 of 11 cycles Administration: 500 mg (05/30/2018), 500 mg (05/09/2018) pertuzumab (PERJETA) 420 mg in sodium chloride 0.9 % 250 mL chemo infusion, 420  mg, Intravenous, Once, 2 of 11 cycles Administration: 420 mg (05/30/2018), 420 mg (05/09/2018)  for chemotherapy treatment.     06/20/2018 -  Chemotherapy    The patient had ado-trastuzumab emtansine (KADCYLA) 320 mg in sodium chloride 0.9 % 250 mL chemo infusion, 300 mg, Intravenous, Once, 2 of 10 cycles Administration: 320 mg (06/20/2018), 320 mg (07/11/2018)  for chemotherapy treatment.      Subjective Data:  ECOG: 1 - Symptomatic but completely ambulatory   Patient [facility] today for all over body aches progressive x2 weeks.  She denies any injuries and or overuse of these muscles.  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  Notices stiffening of her shoulders extending into her upper cervical spine at the end of the day when she is resting.  Body aches/pain is somewhat resolved with rest but not completely.  She has not tried anything OTC.  Is not taking Tylenol or prescribed Norco.  Is not taking her Valium.  Seems to think symptom is directly related to either her chemotherapy (Kadcyla) and or daily radiation.  Was seen this morning by [name] with plastic surgery and noted to have a low-grade temperature of 99.9.  She denies any respiratory concerns like shortness of breath or cough.  Notes mild sinus congestion that started this morning.  Denies nausea, vomiting, diarrhea or constipation.  Denies skin rash.   Review of systems- Review of Systems  Constitutional: Positive for fever (Low-grade) and malaise/fatigue. Negative for chills and weight loss.  HENT: Positive for congestion (Mild). Negative for ear pain and tinnitus.   Eyes: Negative.  Negative for blurred vision and double vision.  Respiratory: Negative.  Negative for cough, sputum production and shortness of breath.   Cardiovascular: Negative.  Negative for chest pain, palpitations and leg swelling.  Gastrointestinal: Negative.  Negative for abdominal pain, constipation, diarrhea, nausea and  vomiting.  Genitourinary: Negative for dysuria, frequency and urgency.  Musculoskeletal: Positive for myalgias. Negative for back pain and falls.       Bilateral shoulder tightness radiating to cervical spine  Skin: Negative.  Negative for rash.  Neurological: Positive for seizures. Negative for weakness and headaches.  Endo/Heme/Allergies: Negative.  Does not bruise/bleed easily.  Psychiatric/Behavioral: Negative.  Negative for depression. The patient is not nervous/anxious and does not have insomnia.      Allergies  Allergen Reactions  . Steri-Strip Compound Benzoin [Benzoin Compound]     Steri-strip ,   . Tape Other (See Comments)    Minor skin irritation      Past Medical History:  Diagnosis Date  . Abnormal breast biopsy    [facility], [name]  . Family history of breast cancer   . Family history of ovarian cancer   . Fibroadenoma of breast, left 2000, 2016  . Genetic screening 08/26/2013   BRCA/BART negative/[employer]riad, CHEK2 POS 2017  . Increased risk of breast cancer 2017   IBIS=47%  .  Left breast lump 06/18/2017   Fluid/drained [name]  . Migraine   . Monoallelic mutation of CHEK2 gene in female patient 2017   increased risk of breast and colon cancer     Past Surgical History:  Procedure Laterality Date  . BREAST BIOPSY Bilateral 09/08/14   neg  . BREAST BIOPSY Left 03/16/2015   Procedure: BREAST BIOPSY WITH NEEDLE LOCALIZATION;  Surgeon: [name], MD;  Location: [facility] ORS;  Service: General;  Laterality: Left;  . BREAST CYST ASPIRATION Left 06/18/2017   cyst aspiration  . BREAST EXCISIONAL BIOPSY Left 09/2014  . BREAST EXCISIONAL BIOPSY Right    age 69's  . BREAST RECONSTRUCTION WITH PLACEMENT OF TISSUE EXPANDER AND FLEX HD (ACELLULAR HYDRATED DERMIS) Left 05/20/2018   Procedure: BREAST RECONSTRUCTION WITH PLACEMENT OF TISSUE EXPANDER AND FLEX HD (ACELLULAR HYDRATED DERMIS);  Surgeon: [name], DO;  Location: [facility] ORS;  Service: Plastics;   Laterality: Left;  . BREAST SURGERY Right March 2000   benign fibroadenoma  . BREAST SURGERY Left 08/08/13   excision  . BREAST SURGERY Left 09/23/14   excision  . MASTECTOMY W/ SENTINEL NODE BIOPSY Left 05/20/2018   Procedure: MASTECTOMY WITH SENTINEL LYMPH NODE BIOPSY;  Surgeon: [name], MD;  Location: [facility] ORS;  Service: General;  Laterality: Left;  . PORTACATH PLACEMENT Right 12/17/2017   Procedure: INSERTION PORT-A-CATH;  Surgeon: [name], MD;  Location: [facility] ORS;  Service: General;  Laterality: Right;    Social History   Socioeconomic History  . Marital status: Married    Spouse name: Not on file  . Number of children: Not on file  . Years of education: Not on file  . Highest education level: Not on file  Occupational History  . Not on file  Social Needs  . Financial resource strain: Not on file  . Food insecurity:    Worry: Not on file    Inability: Not on file  . Transportation needs:    Medical: Not on file    Non-medical: Not on file  Tobacco Use  . Smoking status: Never Smoker  . Smokeless tobacco: Never Used  Substance and Sexual Activity  . Alcohol use: Yes    Comment: occasionally  . Drug use: No  . Sexual activity: Yes    Birth control/protection: Pill  Lifestyle  . Physical activity:    Days per week: 3 days    Minutes per session: 60 min  . Stress: Only a little  Relationships  . Social connections:    Talks on phone: Twice a week    Gets together: Once a week    Attends religious service: More than 4 times per year    Active member of club or organization: No    Attends meetings of clubs or organizations: Never    Relationship status: Married  . Intimate partner violence:    Fear of current or ex partner: No    Emotionally abused: No    Physically abused: No    Forced sexual activity: No  Other Topics Concern  . Not on file  Social History Narrative  . Not on file    Family History  Problem Relation Age of Onset  .  Prostate cancer Father 71  . Breast cancer Paternal Grandmother 83  . Breast cancer Maternal Aunt 60  . Ovarian cancer Maternal Aunt 70  . Breast cancer Paternal Aunt 17  . Pancreatic cancer Maternal Aunt 75     Current Outpatient Medications:  .  venlafaxine XR (EFFEXOR-XR) 37.5 MG 24 hr capsule, Take 1 capsule (37.5 mg total) by mouth daily with breakfast., Disp: 30 capsule, Rfl: 2 .  acetaminophen (TYLENOL) 500 MG tablet, Take 500 mg by mouth every 6 (six) hours as needed for moderate pain or headache., Disp: , Rfl:  .  Chlorphen-Pseudoephed-APAP (CORICIDIN D PO), Take by mouth., Disp: , Rfl:  .  diazepam (VALIUM) 2 MG tablet, Take 2 mg by mouth every 6 (six) hours as needed for anxiety., Disp: , Rfl:  .  HYDROcodone-acetaminophen (NORCO) 5-325 MG tablet, Take 1 tablet by mouth every 8 (eight) hours as needed for moderate pain. (Patient not taking: Reported on 07/11/2018), Disp: 20 tablet, Rfl: 0 .  magnesium oxide (MAG-OX) 400 MG tablet, Take 1 tablet (400 mg total) by mouth daily. (Patient not taking: Reported on 07/19/2018), Disp: 30 tablet, Rfl: 0 .  omeprazole (PRILOSEC OTC) 20 MG tablet, Take 20 mg by mouth daily as needed (for acid reflux)., Disp: , Rfl:  No current facility-administered medications for this visit.   Facility-Administered Medications Ordered in Other Visits:  .  sodium chloride flush (NS) 0.9 % injection 10 mL, 10 mL, Intravenous, PRN, Mike Gip, Melissa C, MD, 10 mL at 04/17/18 0844  Physical exam:  Vitals:   07/19/18 1115 07/19/18 1120  BP: (!) 159/103   Pulse: 96   Resp: 20   Temp: (!) 97.5 F (36.4 C)   TempSrc: Tympanic   Weight:  180 lb 9.6 oz (81.9 kg)  Height:  5' 7"  (1.702 m)   Physical Exam Constitutional:      Appearance: Normal appearance.  HENT:     Head: Normocephalic and atraumatic.  Eyes:     Pupils: Pupils are equal, round, and reactive to light.  Neck:     Musculoskeletal: Normal range of motion.  Cardiovascular:     Rate and  Rhythm: Normal rate and regular rhythm.     Heart sounds: Normal heart sounds. No murmur.  Pulmonary:     Effort: Pulmonary effort is normal.     Breath sounds: Normal breath sounds. No wheezing.  Abdominal:     General: Bowel sounds are normal. There is no distension.     Palpations: Abdomen is soft.     Tenderness: There is no abdominal tenderness.  Musculoskeletal: Normal range of motion.     Right shoulder: She exhibits tenderness.     Left shoulder: She exhibits tenderness.     Cervical back: She exhibits tenderness.  Skin:    General: Skin is warm and dry.     Findings: No rash.  Neurological:     Mental Status: She is alert and oriented to person, place, and time.  Psychiatric:        Judgment: Judgment normal.      CMP Latest Ref Rng & Units 07/11/2018  Glucose 70 - 99 mg/dL 137(H)  BUN 6 - 20 mg/dL 14  Creatinine 0.44 - 1.00 mg/dL 0.56  Sodium 135 - 145 mmol/L 141  Potassium 3.5 - 5.1 mmol/L 3.9  Chloride 98 - 111 mmol/L 105  CO2 22 - 32 mmol/L 25  Calcium 8.9 - 10.3 mg/dL 9.2  Total Protein 6.5 - 8.1 g/dL 7.2  Total Bilirubin 0.3 - 1.2 mg/dL 0.9  Alkaline Phos 38 - 126 U/L 81  AST 15 - 41 U/L 40  ALT 0 - 44 U/L 52(H)   CBC Latest Ref Rng & Units 07/11/2018  WBC 4.0 - 10.5 K/uL 4.0  Hemoglobin 12.0 -  15.0 g/dL 12.6  Hematocrit 36.0 - 46.0 % 37.7  Platelets 150 - 400 K/uL 218    No images are attached to the encounter.  No results found.   Assessment and plan- Patient is a 43 y.o. female who presents to [facility] for progressive myalgias mainly affecting her bilateral shoulders radiating to cervical spine.   Left breast cancer: S/p cycle 2 Kadcyla.  Last given on 07/11/2018.  Tolerating well with only mild fatigue.  Started radiation on 06/26/2018.  She is scheduled to complete radiation on 08/05/2018.  She is status post left mastectomy with immediate breast reconstruction with expander and Flex HD.  She is currently in the process of being expanded and was seen by  her plastic surgeon [name] this morning.  No additional expanding until radiation is complete per her last note.  Healing well.  She is scheduled to RTC daily for radiation and on 08/01/2018 for assessment prior to cycle 3 Kadcyla.   Myalgias: Progressive for 2 to 3 weeks.  No obvious deformity or skin changes. States she noticed the "shoulder tightness" after beginning both radiation and chemotherapy.  Assumed this was a side effect from chemotherapy.  Started out as achiness intermittently and is become fairly constant.  Denies any strenuous activity and or overuse of shoulder muscles.  Does attend physical therapy for left breast mastectomy/reconstructive surgery but denies ever having problems with shoulder/neck.  Has scheduled a massage in the next few weeks.   Sinus congestion: Started this morning.  Is not taking anything for this.  Does not typically have "sinus congestion".  99.3 temperature at plastic surgeon's office this morning.  Rechecked when she got home and it was normal.  Temperature is normal in our clinic today.  States "she does not feel bad".  She does not think that she has the flu.  Plan: Per chart, temperature of 99.3 this morning at plastic surgeon's office.  Will check influenza given symptoms are similar.  No temp while in our clinic today. Negative for Flu.  Recommend rotating Tylenol and ibuprofen throughout the day.  Education provided on maximum dosing daily (3 g for acetaminophen and 3200 mg of ibuprofen) Rotate ice and heat to affected area as needed through the day.  Temperature diary this weekend. Start OTC Claritin 10 mg daily for sinus congestion.  Can take OTC decongestants if needed. Take Valium at bedtime PRN.   I will call patient on Monday morning to see how she is feeling.  She knows she is immunocompromised given recent chemotherapy.  If she develops a fever greater than 100.5 over the weekend she is to call the on-call physician and possibly be seen  in the emergency room.  She knows the importance of hydration and a healthy diet.    Visit Diagnosis 1. Myalgia     Patient expressed understanding and was in agreement with this plan. She also understands that She can call clinic at any time with any questions, concerns, or complaints.   Greater than 50% was spent in counseling and coordination of care with this patient including but not limited to discussion of the relevant topics above (See A&P) including, but not limited to diagnosis and management of acute and chronic medical conditions.   Thank you for allowing me to participate in the care of this very pleasant patient.    Jacquelin Hawking, NP [place] [facility] Cell - [phone] Pager- [phone] 07/19/2018 12:42 PM

## 2018-07-19 NOTE — Patient Instructions (Signed)
It was so great to meet you today.   Keep an eye on your temperature through the weekend.  You were afebrile today while in clinic but continue to check.  Lets start by rotating 400 mg ibuprofen and 1000 mg Tylenol every 6 hours.  Do not exceed 3000 mg Tylenol and/or 3200 mg ibuprofen.   Start taking Claritin twice daily.  Take Valium at night.   Can substitute Norco for Tylenol if uncontrolled with Tylenol alone.   Try heat to affected area several times a day to try and loosen up.  We will touch base with you on Monday to see how you are feeling.  We will check for the flu today given you had one remote reading of 99.9 at Dr. Eusebio Friendly office.    Muscle Pain, Adult Muscle pain (myalgia) may be mild or severe. In most cases, the pain lasts only a short time and it goes away without treatment. It is normal to feel some muscle pain after starting a workout program. Muscles that have not been used often will be sore at first. Muscle pain may also be caused by many other things, including:  Overuse or muscle strain, especially if you are not in shape. This is the most common cause of muscle pain.  Injury.  Bruises.  Viruses, such as the flu.  Infectious diseases.  A chronic condition that causes muscle tenderness, fatigue, and headache (fibromyalgia).  A condition, such as lupus, in which the body's disease-fighting system attacks other organs in the body (autoimmune or rheumatologic diseases).  Certain drugs, including ACE inhibitors and statins. To diagnose the cause of your muscle pain, your health care provider will do a physical exam and ask questions about the pain and when it began. If you have not had muscle pain for very long, your health care provider may want to wait before doing much testing. If your muscle pain has lasted a long time, your health care provider may want to run tests right away. In some cases, this may include tests to rule out certain conditions or  illnesses. Treatment for muscle pain depends on the cause. Home care is often enough to relieve muscle pain. Your health care provider may also prescribe anti-inflammatory medicine. Follow these instructions at home: Activity  If overuse is causing your muscle pain: ? Slow down your activities until the pain goes away. ? Do regular, gentle exercises if you are not usually active. ? Warm up before exercising. Stretch before and after exercising. This can help lower the risk of muscle pain.  Do not continue working out if the pain is very bad. Bad pain could mean that you have injured a muscle. Managing pain and discomfort   If directed, apply ice to the sore muscle: ? Put ice in a plastic bag. ? Place a towel between your skin and the bag. ? Leave the ice on for 20 minutes, 2-3 times a day.  You may also alternate between applying ice and applying heat as told by your health care provider. To apply heat, use the heat source that your health care provider recommends, such as a moist heat pack or a heating pad. ? Place a towel between your skin and the heat source. ? Leave the heat on for 20-30 minutes. ? Remove the heat if your skin turns bright red. This is especially important if you are unable to feel pain, heat, or cold. You may have a greater risk of getting burned. Medicines  Take over-the-counter  and prescription medicines only as told by your health care provider.  Do not drive or use heavy machinery while taking prescription pain medicine. Contact a health care provider if:  Your muscle pain gets worse and medicines do not help.  You have muscle pain that lasts longer than 3 days.  You have a rash or fever along with muscle pain.  You have muscle pain after a tick bite.  You have muscle pain while working out, even though you are in good physical condition.  You have redness, soreness, or swelling along with muscle pain.  You have muscle pain after starting a new  medicine or changing the dose of a medicine. Get help right away if:  You have trouble breathing.  You have trouble swallowing.  You have muscle pain along with a stiff neck, fever, and vomiting.  You have severe muscle weakness or cannot move part of your body. This information is not intended to replace advice given to you by your health care provider. Make sure you discuss any questions you have with your health care provider. Document Released: 04/27/2006 Document Revised: 12/24/2015 Document Reviewed: 10/26/2015 Elsevier Interactive Patient Education  2019 [employer].

## 2018-07-22 ENCOUNTER — Ambulatory Visit
Admission: RE | Admit: 2018-07-22 | Discharge: 2018-07-22 | Disposition: A | Discharge status: Home / Self Care | Payer: BC Managed Care – PPO | Source: Ambulatory Visit | Attending: Radiation Oncology | Admitting: Radiation Oncology

## 2018-07-22 DIAGNOSIS — Z170 Estrogen receptor positive status [ER+]: Secondary | ICD-10-CM | POA: Diagnosis not present

## 2018-07-22 DIAGNOSIS — C50412 Malignant neoplasm of upper-outer quadrant of left female breast: Secondary | ICD-10-CM | POA: Diagnosis not present

## 2018-07-23 ENCOUNTER — Ambulatory Visit
Admission: RE | Admit: 2018-07-23 | Discharge: 2018-07-23 | Disposition: A | Discharge status: Home / Self Care | Payer: BC Managed Care – PPO | Source: Ambulatory Visit | Attending: Radiation Oncology | Admitting: Radiation Oncology

## 2018-07-23 DIAGNOSIS — C50412 Malignant neoplasm of upper-outer quadrant of left female breast: Secondary | ICD-10-CM | POA: Diagnosis not present

## 2018-07-24 ENCOUNTER — Ambulatory Visit
Admission: RE | Admit: 2018-07-24 | Discharge: 2018-07-24 | Disposition: A | Discharge status: Home / Self Care | Payer: BC Managed Care – PPO | Source: Ambulatory Visit | Attending: Radiation Oncology | Admitting: Radiation Oncology

## 2018-07-24 ENCOUNTER — Encounter: Payer: Self-pay | Admitting: Physical Therapy

## 2018-07-24 ENCOUNTER — Ambulatory Visit: Payer: BC Managed Care – PPO | Attending: Plastic Surgery | Admitting: Physical Therapy

## 2018-07-24 DIAGNOSIS — M25512 Pain in left shoulder: Secondary | ICD-10-CM

## 2018-07-24 DIAGNOSIS — C50412 Malignant neoplasm of upper-outer quadrant of left female breast: Secondary | ICD-10-CM | POA: Diagnosis not present

## 2018-07-24 NOTE — Therapy (Signed)
[facility] [address], [place], [address] Phone: [phone]   Fax:  [phone]  Physical Therapy Treatment  Patient Details  Name: Denise [name] MRN: [id number] Date of Birth: 1975-07-23 Referring Provider (PT): Dillingham   Encounter Date: 07/24/2018  PT End of Session - 07/24/18 0752    Visit Number  5    Number of Visits  17    Date for PT Re-Evaluation  08/14/18    PT Start Time  0730    PT Stop Time  0815    PT Time Calculation (min)  45 min    Activity Tolerance  Patient tolerated treatment well    Behavior During Therapy  [name] for tasks assessed/performed       Past Medical History:  Diagnosis Date  . Abnormal breast biopsy    PASH, [name]  . Family history of breast cancer   . Family history of ovarian cancer   . Fibroadenoma of breast, left 2000, 2016  . Genetic screening 08/26/2013   BRCA/BART negative/Myriad, CHEK2 POS 2017  . Increased risk of breast cancer 2017   IBIS=47%  . Left breast lump 06/18/2017   Fluid/drained [name]  . Migraine   . Monoallelic mutation of CHEK2 gene in female patient 2017   increased risk of breast and colon cancer    Past Surgical History:  Procedure Laterality Date  . BREAST BIOPSY Bilateral 09/08/14   neg  . BREAST BIOPSY Left 03/16/2015   Procedure: BREAST BIOPSY WITH NEEDLE LOCALIZATION;  Surgeon: [name], MD;  Location: [facility] ORS;  Service: General;  Laterality: Left;  . BREAST CYST ASPIRATION Left 06/18/2017   cyst aspiration  . BREAST EXCISIONAL BIOPSY Left 09/2014  . BREAST EXCISIONAL BIOPSY Right    age 13's  . BREAST RECONSTRUCTION WITH PLACEMENT OF TISSUE EXPANDER AND FLEX HD (ACELLULAR HYDRATED DERMIS) Left 05/20/2018   Procedure: BREAST RECONSTRUCTION WITH PLACEMENT OF TISSUE EXPANDER AND FLEX HD (ACELLULAR HYDRATED DERMIS);  Surgeon: [name], DO;  Location: [facility] ORS;  Service: Plastics;  Laterality: Left;   . BREAST SURGERY Right March 2000   benign fibroadenoma  . BREAST SURGERY Left 08/08/13   excision  . BREAST SURGERY Left 09/23/14   excision  . MASTECTOMY W/ SENTINEL NODE BIOPSY Left 05/20/2018   Procedure: MASTECTOMY WITH SENTINEL LYMPH NODE BIOPSY;  Surgeon: [name], MD;  Location: [facility] OR[facility];  Service: General;  Laterality: Left;  . PORTACATH PLACEMENT Right 12/17/2017   Procedure: INSERTION PORT-A-CATH;  Surgeon: [name], MD;  Location: [facility] ORS;  Service: General;  Laterality: Right;    There were no vitals filed for this visit.  Subjective Assessment - 07/24/18 0734    Subjective  Patient reports minimal shoulder pain with some trigger points under the breast with minimal pain. Compliance with HEP    Pertinent History  Patient is a 43 year old female with breast cancer history beginning in 2016 with multiple benign lumpectomies. Patient reports first malignant finding this past June 2019 in L breast, with following masectomy with saline expander placed 05/20/18. Patient reports chemo prior with radiation beginning next week 5days/week, for 6 weeks. Patient reports she is also planning to have R masectomy with saline expansion in the future, unscheduled. 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 Patient reports she is a mother of a 82 year old and 11 year old that she reports are very involved  in extra-curriculars. Patient reports her ROM in the shoulder is improving, but that  she is having some pain in the inside elbow and into medial forearm with straightening. Patient reports some TTP at incision site, but most all pain at elbow into forearm. Worst pain in past week 7/10 and best 0/10.  Reports pain is sharp in the medial elbow and forearm; denies numbness or tingling    Limitations  House hold activities;Lifting    How long can you sit comfortably?  unlimited    How long can you stand comfortably?  unlimited    How long can you walk  comfortably?  unlimited    Patient Stated Goals  Decrease pain       Ther-Ex - Diaphragmatic breathing education with demo and max cuing for rib cage expansion without accessory chest/cervical musculature; following x10 reps with proper form.carry over - Standing rows 20# 3x 10 with min cuing initially for set up with good carry over following - Standing high rows 10# 3x 10 with TC for set up with full scapular contraction with good carry over  - Lat pulldown 35# 3x 10 with good carry over from previous sessions   Manual - STM withtrigger point releaseto L pec minor, wrist flexors, and latissimus/teresminor/major(increased time spent on pec minor and wrist flexors) - Multiple bouts of PROM in all directions with patient demonstrating near full ROM following manual techniques - GHJ AP grade III mobs 30sec bouts 8 bouts with abd to increase ROM                        PT Education - 07/24/18 0748    Education Details  Exercise form; diphragmatic breathing    Person(s) Educated  Patient    Methods  Explanation;Demonstration;Verbal cues;Tactile cues    Comprehension  Verbalized understanding;Returned demonstration;Verbal cues required;Tactile cues required       PT Short Term Goals - 06/21/18 1143      PT SHORT TERM GOAL #1   Title  Pt will be independent with HEP in order to improve strength and motion, and to decrease pain in order to improve pain-free function at home and work.    Time  4    Period  Weeks    Status  New        PT Long Term Goals - 06/21/18 1144      PT LONG TERM GOAL #1   Title  Pt will decrease worst pain as reported on NPRS by at least 3 points in order to demonstrate clinically significant reduction in pain.    Baseline  06/20/18 7/10 with overhead motion with elbow ext    Time  8    Period  Weeks    Status  New      PT LONG TERM GOAL #2   Title  Patient will increase FOTO score to 72 to demonstrate predicted increase in  functional mobility to complete ADLs    Baseline  06/20/18 58    Time  8    Period  Weeks    Status  New      PT LONG TERM GOAL #3   Title  Patient will demonstrate all active shoulder ROM within normal limits in order to complete ADLs    Baseline  06/20/18 see eval    Time  8    Period  Weeks    Status  New            Plan -  07/24/18 0901    Clinical Impression Statement  Patient with increased tension at intercostal/core musculature inferior to left breast. Patient shoulder ROM wnl with minimal pain with AROM/PROM, PT led patient through therex progression for postural muscle strengthening and diaphragmatic breathing for full rib cage expansion; all of which patient is able to complete with accuracy following PT demo and cuing. PT will continue progression as able.     Rehab Potential  Good    Clinical Impairments Affecting Rehab Potential  (+) age, motivation, social support, (-) current sedentary lifestyle, ongoing cancer treatment, other comorbidities    PT Duration  8 weeks    PT Treatment/Interventions  ADLs/Self Care Home Management;Aquatic Therapy;Electrical Stimulation;Cryotherapy;Moist Heat;Iontophoresis 21m/ml Dexamethasone;Functional mobility training;Therapeutic activities;Therapeutic exercise;Traction;Ultrasound;Neuromuscular re-education;Manual techniques;Patient/family education;Manual lymph drainage;Passive range of motion;Dry needling;Energy conservation;Taping    PT Next Visit Plan  Ease soft tissue restrictions, increase ROM as able    PT Home Exercise Plan  pulleys shoulder abd/flex, sidelying lat stretch, supine ER stretch, scar massage    Consulted and Agree with Plan of Care  Patient       Patient will benefit from skilled therapeutic intervention in order to improve the following deficits and impairments:  Decreased activity tolerance, Decreased endurance, Decreased range of motion, Decreased skin integrity, Hypomobility, Increased fascial restricitons, Impaired  UE functional use, Improper body mechanics, Pain, Postural dysfunction, Impaired flexibility, Decreased scar mobility  Visit Diagnosis: Acute pain of left shoulder     Problem List Patient Active Problem List   Diagnosis Date Noted  . Hot flashes due to menopause 06/18/2018  . Status post left mastectomy 06/04/2018  . S/P breast reconstruction 05/28/2018  . Breast cancer ([facility] 05/20/2018  . Vasomotor symptoms due to menopause 04/17/2018  . Elevated LFTs 03/17/2018  . Goals of care, counseling/discussion 03/17/2018  . Hypomagnesemia 03/04/2018  . Diarrhea 01/02/2018  . Oral candidiasis 12/31/2017  . Encounter for antineoplastic chemotherapy 12/21/2017  . Encounter for antineoplastic immunotherapy 12/21/2017  . Lymphadenopathy, axillary 12/06/2017  . 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 Benign breast cyst in female, left 06/19/2017  . Breast mass, right 12/05/2016  . 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 Monoallelic mutation of CHEK2 gene in female patient 06/20/2015  . Increased risk of breast cancer 06/20/2015  . Malignant neoplasm of upper-outer quadrant of female breast ([facility] 09/17/2014  . Flat epithelial atypia of breast 08/08/2013  . Fibroadenoma 07/31/2013   CShelton SilvasPT, DPT CShelton Silvas2/10/2018, 10:01 AM  [facility] [address] [place] [address]Phone: [phone]  Fax:  [phone] Name: ESHEVETTE BESSMRN: [id number] of Birth: [birth year]

## 2018-07-25 ENCOUNTER — Ambulatory Visit
Admission: RE | Admit: 2018-07-25 | Discharge: 2018-07-25 | Disposition: A | Discharge status: Home / Self Care | Payer: BC Managed Care – PPO | Source: Ambulatory Visit | Attending: Radiation Oncology | Admitting: Radiation Oncology

## 2018-07-25 DIAGNOSIS — C50412 Malignant neoplasm of upper-outer quadrant of left female breast: Secondary | ICD-10-CM | POA: Diagnosis not present

## 2018-07-26 ENCOUNTER — Ambulatory Visit
Admission: RE | Admit: 2018-07-26 | Discharge: 2018-07-26 | Disposition: A | Discharge status: Home / Self Care | Payer: BC Managed Care – PPO | Source: Ambulatory Visit | Attending: Radiation Oncology | Admitting: Radiation Oncology

## 2018-07-26 ENCOUNTER — Ambulatory Visit: Payer: BC Managed Care – PPO | Admitting: Physical Therapy

## 2018-07-26 ENCOUNTER — Ambulatory Visit: Payer: BC Managed Care – PPO

## 2018-07-26 DIAGNOSIS — C50412 Malignant neoplasm of upper-outer quadrant of left female breast: Secondary | ICD-10-CM | POA: Diagnosis not present

## 2018-07-29 ENCOUNTER — Ambulatory Visit
Admission: RE | Admit: 2018-07-29 | Discharge: 2018-07-29 | Disposition: A | Discharge status: Home / Self Care | Payer: BC Managed Care – PPO | Source: Ambulatory Visit | Attending: Radiation Oncology | Admitting: Radiation Oncology

## 2018-07-29 DIAGNOSIS — C50412 Malignant neoplasm of upper-outer quadrant of left female breast: Secondary | ICD-10-CM | POA: Diagnosis not present

## 2018-07-30 ENCOUNTER — Ambulatory Visit
Admission: RE | Admit: 2018-07-30 | Discharge: 2018-07-30 | Disposition: A | Discharge status: Home / Self Care | Payer: BC Managed Care – PPO | Source: Ambulatory Visit | Attending: Radiation Oncology | Admitting: Radiation Oncology

## 2018-07-30 DIAGNOSIS — C50412 Malignant neoplasm of upper-outer quadrant of left female breast: Secondary | ICD-10-CM | POA: Diagnosis not present

## 2018-07-31 ENCOUNTER — Encounter: Payer: Self-pay | Admitting: Physical Therapy

## 2018-07-31 ENCOUNTER — Ambulatory Visit: Payer: BC Managed Care – PPO | Admitting: Physical Therapy

## 2018-07-31 ENCOUNTER — Ambulatory Visit
Admission: RE | Admit: 2018-07-31 | Discharge: 2018-07-31 | Disposition: A | Discharge status: Home / Self Care | Payer: BC Managed Care – PPO | Source: Ambulatory Visit | Attending: Radiation Oncology | Admitting: Radiation Oncology

## 2018-07-31 ENCOUNTER — Encounter: Payer: Self-pay | Admitting: Urgent Care

## 2018-07-31 DIAGNOSIS — C50412 Malignant neoplasm of upper-outer quadrant of left female breast: Secondary | ICD-10-CM | POA: Diagnosis not present

## 2018-07-31 DIAGNOSIS — M25512 Pain in left shoulder: Secondary | ICD-10-CM

## 2018-07-31 NOTE — Therapy (Signed)
[facility] [address] [place], [address] Phone: [phone]   Fax:  [phone]  Physical Therapy Treatment  Patient Details  Name: Denise [name] MRN: [id number] Date of Birth: [birth year] Referring Provider (PT): Dillingham   Encounter Date: 07/31/2018  PT End of Session - 07/31/18 0751    Visit Number  6    Number of Visits  17    Date for PT Re-Evaluation  08/14/18    PT Start Time  0730    PT Stop Time  0815    PT Time Calculation (min)  45 min    Activity Tolerance  Patient tolerated treatment well    Behavior During Therapy  [facility] for tasks assessed/performed       Past Medical History:  Diagnosis Date  . Abnormal breast biopsy    PASH, [name]  . Family history of breast cancer   . Family history of ovarian cancer   . Fibroadenoma of breast, left 2000, 2016  . Genetic screening 08/26/2013   BRCA/BART negative/Myriad, CHEK2 POS 2017  . Increased risk of breast cancer 2017   IBIS=47%  . Left breast lump 06/18/2017   Fluid/drained [name]  . Migraine   . Monoallelic mutation of CHEK2 gene in female patient 2017   increased risk of breast and colon cancer    Past Surgical History:  Procedure Laterality Date  . BREAST BIOPSY Bilateral 09/08/14   neg  . BREAST BIOPSY Left 03/16/2015   Procedure: BREAST BIOPSY WITH NEEDLE LOCALIZATION;  Surgeon: [name], MD;  Location: [facility];  Service: General;  Laterality: Left;  . BREAST CYST ASPIRATION Left 06/18/2017   cyst aspiration  . BREAST EXCISIONAL BIOPSY Left 09/2014  . BREAST EXCISIONAL BIOPSY Right    age 89's  . BREAST RECONSTRUCTION WITH PLACEMENT OF TISSUE EXPANDER AND FLEX HD (ACELLULAR HYDRATED DERMIS) Left 05/20/2018   Procedure: BREAST RECONSTRUCTION WITH PLACEMENT OF TISSUE EXPANDER AND FLEX HD (ACELLULAR HYDRATED DERMIS);  Surgeon: [name], DO;  Location: [facility];  Service: Plastics;  Laterality: Left;   . BREAST SURGERY Right March 2000   benign fibroadenoma  . BREAST SURGERY Left 08/08/13   excision  . BREAST SURGERY Left 09/23/14   excision  . MASTECTOMY W/ SENTINEL NODE BIOPSY Left 05/20/2018   Procedure: MASTECTOMY WITH SENTINEL LYMPH NODE BIOPSY;  Surgeon: [name], MD;  Location: [facility];  Service: General;  Laterality: Left;  . PORTACATH PLACEMENT Right 12/17/2017   Procedure: INSERTION PORT-A-CATH;  Surgeon: [name], MD;  Location: [facility];  Service: General;  Laterality: Right;    There were no vitals filed for this visit.  Subjective Assessment - 07/31/18 0743    Subjective  Patient reports minimal shoulder pain with increased ROM this session. Patient reports compliance with HEP with no questions or concerns.     Pertinent History  Patient is a 43 year old female with breast cancer history beginning in 2016 with multiple benign lumpectomies. Patient reports first malignant finding this past June 2019 in L breast, with following masectomy with saline expander placed 05/20/18. Patient reports chemo prior with radiation beginning next week 5days/week, for 6 weeks. Patient reports she is also planning to have R masectomy with saline expansion in the future, unscheduled. 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 Patient reports her ROM in the shoulder is improving, but that  she is having some pain in the inside elbow and into medial forearm with straightening. Patient reports some TTP at incision site, but most all pain at elbow into forearm. Worst pain in past week 7/10 and best 0/10.  Reports pain is sharp in the medial elbow and forearm; denies numbness or tingling    Limitations  House hold activities;Lifting    How long can you sit comfortably?  unlimited    How long can you stand comfortably?  unlimited    How long can  you walk comfortably?  unlimited    Patient Stated Goals  Decrease pain    Pain Onset  1 to 4 weeks ago       Ther-Ex - Standing rows 25# 3x 10 with good carry over from previous sessions - Standing IR and ER 5# 3x 8 each with towel for TC  - Lateral raise 2# 3x 10 with min cuing for eccentric control with good carry over following - Standing front raise 2# 3x 10 with good carry over from lateral raise - Push up plus 3x 10 with good carry over following demo and cuing  Manual - STM withtrigger point releaseto L pec minor, and latissimus/teresminor/major(decreased tension at wrist flexors and bicep this session) - Multiple bouts of PROM in all directions with patient demonstrating near full ROM following manual techniques                        PT Education - 07/31/18 0749    Education Details  Exercise form    Person(s) Educated  Patient    Methods  Explanation;Demonstration;Verbal cues    Comprehension  Verbalized understanding;Returned demonstration;Verbal cues required       PT Short Term Goals - 06/21/18 1143      PT SHORT TERM GOAL #1   Title  Pt will be independent with HEP in order to improve strength and motion, and to decrease pain in order to improve pain-free function at home and work.    Time  4    Period  Weeks    Status  New        PT Long Term Goals - 06/21/18 1144      PT LONG TERM GOAL #1   Title  Pt will decrease worst pain as reported on NPRS by at least 3 points in order to demonstrate clinically significant reduction in pain.    Baseline  06/20/18 7/10 with overhead motion with elbow ext    Time  8    Period  Weeks    Status  New      PT LONG TERM GOAL #2   Title  Patient will increase FOTO score to 72 to demonstrate predicted increase in functional mobility to complete ADLs    Baseline  06/20/18 58    Time  8    Period  Weeks    Status  New      PT LONG TERM GOAL #3   Title  Patient will demonstrate all active shoulder  ROM within normal limits in order to complete ADLs    Baseline  06/20/18 see eval    Time  8    Period  Weeks    Status  New            Plan - 07/31/18 0981    Clinical Impression Statement  Patient with decreased  soft tissue restriction this session, allowing for increased therex progression with no increased pain, only noted muscle fatigue. Patient able to complete all therex with accuracy following PT cuing.     Rehab Potential  Good    Clinical Impairments Affecting Rehab Potential  (+) age, motivation, social support, (-) current sedentary lifestyle, ongoing cancer treatment, other comorbidities    PT Frequency  2x / week    PT Duration  8 weeks    PT Treatment/Interventions  ADLs/Self Care Home Management;Aquatic Therapy;Electrical Stimulation;Cryotherapy;Moist Heat;Iontophoresis 59m/ml Dexamethasone;Functional mobility training;Therapeutic activities;Therapeutic exercise;Traction;Ultrasound;Neuromuscular re-education;Manual techniques;Patient/family education;Manual lymph drainage;Passive range of motion;Dry needling;Energy conservation;Taping    PT Next Visit Plan  Ease soft tissue restrictions, increase ROM as able    PT Home Exercise Plan  pulleys shoulder abd/flex, sidelying lat stretch, supine ER stretch, scar massage    Consulted and Agree with Plan of Care  Patient       Patient will benefit from skilled therapeutic intervention in order to improve the following deficits and impairments:  Decreased activity tolerance, Decreased endurance, Decreased range of motion, Decreased skin integrity, Hypomobility, Increased fascial restricitons, Impaired UE functional use, Improper body mechanics, Pain, Postural dysfunction, Impaired flexibility, Decreased scar mobility  Visit Diagnosis: Acute pain of left shoulder     Problem List Patient Active Problem List   Diagnosis Date Noted  . Hot flashes due to menopause 06/18/2018  . Status post left mastectomy 06/04/2018  . S/P  breast reconstruction 05/28/2018  . Breast cancer ([facility] [place] 05/20/2018  . Vasomotor symptoms due to menopause 04/17/2018  . Elevated LFTs 03/17/2018  . Goals of care, counseling/discussion 03/17/2018  . Hypomagnesemia 03/04/2018  . Diarrhea 01/02/2018  . Oral candidiasis 12/31/2017  . Encounter for antineoplastic chemotherapy 12/21/2017  . Encounter for antineoplastic immunotherapy 12/21/2017  . Lymphadenopathy, axillary 12/06/2017  . 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 Benign breast cyst in female, left 06/19/2017  . Breast mass, right 12/05/2016  . 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 Monoallelic mutation of CHEK2 gene in female patient 06/20/2015  . Increased risk of breast cancer 06/20/2015  . Malignant neoplasm of upper-outer quadrant of female breast ([facility] 09/17/2014  . Flat epithelial atypia of breast 08/08/2013  . Fibroadenoma 07/31/2013   CShelton SilvasPT, DPT CShelton Silvas2/05/2019, 9:13 AM  [place] [facility] [address] [place] [address]Phone: [phone]  Fax:  [phone] Name: EAKELIA HUSTEDMRN: [id number] of Birth: 101-25-1977

## 2018-08-01 ENCOUNTER — Other Ambulatory Visit: Payer: BC Managed Care – PPO

## 2018-08-01 ENCOUNTER — Ambulatory Visit: Payer: BC Managed Care – PPO | Admitting: General Surgery

## 2018-08-01 ENCOUNTER — Inpatient Hospital Stay: Payer: BC Managed Care – PPO

## 2018-08-01 ENCOUNTER — Ambulatory Visit: Payer: BC Managed Care – PPO | Admitting: Hematology and Oncology

## 2018-08-01 ENCOUNTER — Encounter: Payer: Self-pay | Admitting: Hematology and Oncology

## 2018-08-01 ENCOUNTER — Inpatient Hospital Stay (HOSPITAL_BASED_OUTPATIENT_CLINIC_OR_DEPARTMENT_OTHER): Payer: BC Managed Care – PPO | Admitting: Hematology and Oncology

## 2018-08-01 ENCOUNTER — Ambulatory Visit: Payer: BC Managed Care – PPO

## 2018-08-01 ENCOUNTER — Ambulatory Visit
Admission: RE | Admit: 2018-08-01 | Discharge: 2018-08-01 | Disposition: A | Discharge status: Home / Self Care | Payer: BC Managed Care – PPO | Source: Ambulatory Visit | Attending: Radiation Oncology | Admitting: Radiation Oncology

## 2018-08-01 VITALS — BP 135/99 | HR 92 | Temp 97.0°F | Resp 18 | Ht 67.0 in | Wt 182.2 lb

## 2018-08-01 DIAGNOSIS — R945 Abnormal results of liver function studies: Secondary | ICD-10-CM

## 2018-08-01 DIAGNOSIS — H539 Unspecified visual disturbance: Secondary | ICD-10-CM | POA: Diagnosis not present

## 2018-08-01 DIAGNOSIS — Z170 Estrogen receptor positive status [ER+]: Secondary | ICD-10-CM | POA: Insufficient documentation

## 2018-08-01 DIAGNOSIS — Z5112 Encounter for antineoplastic immunotherapy: Secondary | ICD-10-CM

## 2018-08-01 DIAGNOSIS — C773 Secondary and unspecified malignant neoplasm of axilla and upper limb lymph nodes: Secondary | ICD-10-CM | POA: Insufficient documentation

## 2018-08-01 DIAGNOSIS — R7989 Other specified abnormal findings of blood chemistry: Secondary | ICD-10-CM

## 2018-08-01 DIAGNOSIS — C50212 Malignant neoplasm of upper-inner quadrant of left female breast: Secondary | ICD-10-CM

## 2018-08-01 DIAGNOSIS — C50419 Malignant neoplasm of upper-outer quadrant of unspecified female breast: Secondary | ICD-10-CM

## 2018-08-01 DIAGNOSIS — Z1501 Genetic susceptibility to malignant neoplasm of breast: Secondary | ICD-10-CM

## 2018-08-01 DIAGNOSIS — Z79899 Other long term (current) drug therapy: Secondary | ICD-10-CM | POA: Insufficient documentation

## 2018-08-01 DIAGNOSIS — C50412 Malignant neoplasm of upper-outer quadrant of left female breast: Secondary | ICD-10-CM | POA: Diagnosis not present

## 2018-08-01 DIAGNOSIS — N951 Menopausal and female climacteric states: Secondary | ICD-10-CM

## 2018-08-01 LAB — COMPREHENSIVE METABOLIC PANEL
ALT: 49 U/L — ABNORMAL HIGH (ref 0–44)
AST: 40 U/L (ref 15–41)
Albumin: 4.1 g/dL (ref 3.5–5.0)
Alkaline Phosphatase: 92 U/L (ref 38–126)
Anion gap: 10 (ref 5–15)
BUN: 12 mg/dL (ref 6–20)
CO2: 27 mmol/L (ref 22–32)
Calcium: 9.3 mg/dL (ref 8.9–10.3)
Chloride: 103 mmol/L (ref 98–111)
Creatinine, Ser: 0.64 mg/dL (ref 0.44–1.00)
GFR calc Af Amer: >60 mL/min (ref >60)
GFR calc non Af Amer: >60 mL/min (ref >60)
Glucose, Bld: 133 mg/dL — ABNORMAL HIGH (ref 70–99)
Potassium: 3.7 mmol/L (ref 3.5–5.1)
Sodium: 140 mmol/L (ref 135–145)
Total Bilirubin: 1 mg/dL (ref 0.3–1.2)
Total Protein: 7.4 g/dL (ref 6.5–8.1)

## 2018-08-01 LAB — PREGNANCY, URINE: Preg Test, Ur: NEGATIVE

## 2018-08-01 LAB — CBC WITH DIFFERENTIAL/PLATELET
Abs Immature Granulocytes: 0.03 10*3/uL (ref 0.00–0.07)
Basophils Absolute: 0 10*3/uL (ref 0.0–0.1)
Basophils Relative: 1 %
Eosinophils Absolute: 0.1 10*3/uL (ref 0.0–0.5)
Eosinophils Relative: 3 %
HCT: 38.2 % (ref 36.0–46.0)
Hemoglobin: 12.8 g/dL (ref 12.0–15.0)
Immature Granulocytes: 1 %
Lymphocytes Relative: 17 %
Lymphs Abs: 0.9 10*3/uL (ref 0.7–4.0)
MCH: 28.7 pg (ref 26.0–34.0)
MCHC: 33.5 g/dL (ref 30.0–36.0)
MCV: 85.7 fL (ref 80.0–100.0)
Monocytes Absolute: 0.4 10*3/uL (ref 0.1–1.0)
Monocytes Relative: 8 %
Neutro Abs: 3.8 10*3/uL (ref 1.7–7.7)
Neutrophils Relative %: 70 %
Platelets: 256 10*3/uL (ref 150–400)
RBC: 4.46 MIL/uL (ref 3.87–5.11)
RDW: 12.6 % (ref 11.5–15.5)
WBC: 5.3 10*3/uL (ref 4.0–10.5)
nRBC: 0 % (ref 0.0–0.2)

## 2018-08-01 LAB — MAGNESIUM: Magnesium: 1.9 mg/dL (ref 1.7–2.4)

## 2018-08-01 MED ORDER — HEPARIN SOD (PORK) LOCK FLUSH 100 UNIT/ML IV SOLN
500.0000 [IU] | Freq: Once | INTRAVENOUS | Status: AC
  Administered 2018-08-01: 500 [IU] via INTRAVENOUS

## 2018-08-01 MED ORDER — SODIUM CHLORIDE 0.9% FLUSH
10.0000 mL | INTRAVENOUS | Status: DC | PRN
  Administered 2018-08-01: 10 mL via INTRAVENOUS
  Filled 2018-08-01: qty 10

## 2018-08-01 NOTE — Progress Notes (Signed)
No chemotherapy today.

## 2018-08-01 NOTE — Progress Notes (Signed)
[facility] day:  08/01/18  Chief Complaint: Denise [name] is a 43 y.o. female with multi-focal Her2/neu + left breast cancer who is seen for assessment prior to cycle #3 Kadcyla.  HPI: The patient was last seen in the medical oncology clinic on 07/11/2018.  At that time, she was doing well.  She noted some fatigue after cycle #1 Kadcycla.  ALT was 52.  Magnesium was 1.6.  She received cycle #2 Kadcyla.  She received a Rx for magnesium oxide.  Patient continues to be followed by Dr. Audelia Hives. Additional saline fills are on hold. Plans are to follow up on 08/09/2018.   She was seen in the symptom management clinic on 07/19/2018 by Faythe Casa, NP for mylagia.  She was noted to have a temperature of 99.3 at the plastic surgeon's office that day.  Influenza was negative.  Tylenol and ibuprofen were recommended.  Patient continues with radiation therapy treatment. She is scheduled to complete her therapy on 08/05/2018.  During the interim, patient presents fatigued. She continues to work full time. She denies any major treatment related side effects. Patient denies that she has experienced any B symptoms. She notes that she has had some "cold symptoms" for which she has been using Coricidin products. No nausea, vomiting, diarrhea, or fevers.  Patient complains of blurred vision that began with acute onset on 07/27/2018. She notes that the symptoms have become progressively worse since the onset. She denies eye pain or diplopia. Plans are to be seen by her ophthalmologist on 08/02/2018 at 1400.  Patient advises that she maintains an adequate appetite. She is eating well. Weight today is 182 lb 3.7 oz (82.7 kg), which compared to her last visit to the clinic, represents a 10 pound weight loss.   Patient denies pain in the clinic today.   Past Medical History:  Diagnosis Date  . Abnormal breast biopsy    [facility], [name]  . Family  history of breast cancer   . Family history of ovarian cancer   . Fibroadenoma of breast, left 2000, 2016  . Genetic screening 08/26/2013   BRCA/BART negative/[facility], CHEK2 POS 2017  . Increased risk of breast cancer 2017   IBIS=47%  . Left breast lump 06/18/2017   Fluid/drained [name]  . Migraine   . Monoallelic mutation of CHEK2 gene in female patient 2017   increased risk of breast and colon cancer    Past Surgical History:  Procedure Laterality Date  . BREAST BIOPSY Bilateral 09/08/14   neg  . BREAST BIOPSY Left 03/16/2015   Procedure: BREAST BIOPSY WITH NEEDLE LOCALIZATION;  Surgeon: [name], MD;  Location: [facility] ORS;  Service: General;  Laterality: Left;  . BREAST CYST ASPIRATION Left 06/18/2017   cyst aspiration  . BREAST EXCISIONAL BIOPSY Left 09/2014  . BREAST EXCISIONAL BIOPSY Right    age 36's  . BREAST RECONSTRUCTION WITH PLACEMENT OF TISSUE EXPANDER AND FLEX HD (ACELLULAR HYDRATED DERMIS) Left 05/20/2018   Procedure: BREAST RECONSTRUCTION WITH PLACEMENT OF TISSUE EXPANDER AND FLEX HD (ACELLULAR HYDRATED DERMIS);  Surgeon: [name], DO;  Location: [facility] ORS;  Service: Plastics;  Laterality: Left;  . BREAST SURGERY Right March 2000   benign fibroadenoma  . BREAST SURGERY Left 08/08/13   excision  . BREAST SURGERY Left 09/23/14   excision  . MASTECTOMY W/ SENTINEL NODE BIOPSY Left 05/20/2018   Procedure: MASTECTOMY WITH SENTINEL LYMPH NODE BIOPSY;  Surgeon: [name],  MD;  Location: [facility] OR[facility];  Service: General;  Laterality: Left;  . PORTACATH PLACEMENT Right 12/17/2017   Procedure: INSERTION PORT-A-CATH;  Surgeon: [name], MD;  Location: [facility] ORS;  Service: General;  Laterality: Right;    Family History  Problem Relation Age of Onset  . Prostate cancer Father 75  . Breast cancer Paternal Grandmother 81  . Breast cancer Maternal Aunt 60  . Ovarian cancer Maternal Aunt 70  . Breast cancer Paternal Aunt 18  . Pancreatic cancer  Maternal Aunt 75    Social History:  reports that she has never smoked. She has never used smokeless tobacco. She reports current alcohol use. She reports that she does not use drugs.  She does not smoke. She "occasionally" drinks alcohol. 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 Patient denies known exposures to radiation on toxins. Her husband's name is [name]. She has 2 daughters, [name] and [name] (ages 79 1/2 and 44).  Her husband's name is [name].  The patient is alone today.  Allergies:  Allergies  Allergen Reactions  . Steri-Strip Compound Benzoin [Benzoin Compound]     Steri-strip ,   . Tape Other (See Comments)    Minor skin irritation     Current Medications: Current Outpatient Medications  Medication Sig Dispense Refill  . venlafaxine XR (EFFEXOR-XR) 37.5 MG 24 hr capsule Take 1 capsule (37.5 mg total) by mouth daily with breakfast. 30 capsule 2  . acetaminophen (TYLENOL) 500 MG tablet Take 500 mg by mouth every 6 (six) hours as needed for moderate pain or headache.    . Chlorphen-Pseudoephed-APAP (CORICIDIN D PO) Take by mouth.    . diazepam (VALIUM) 2 MG tablet Take 2 mg by mouth every 6 (six) hours as needed for anxiety.    Marland Kitchen HYDROcodone-acetaminophen (NORCO) 5-325 MG tablet Take 1 tablet by mouth every 8 (eight) hours as needed for moderate pain. (Patient not taking: Reported on 07/11/2018) 20 tablet 0  . magnesium oxide (MAG-OX) 400 MG tablet Take 1 tablet (400 mg total) by mouth daily. (Patient not taking: Reported on 07/19/2018) 30 tablet 0  . omeprazole (PRILOSEC OTC) 20 MG tablet Take 20 mg by mouth daily as needed (for acid reflux).     No current facility-administered medications for this visit.    Facility-Administered Medications Ordered in Other Visits  Medication Dose Route Frequency Provider Last Rate Last Dose  . heparin lock flush 100 unit/mL  500 Units Intravenous Once Corcoran, Melissa C, MD      . sodium chloride flush (NS) 0.9 %  injection 10 mL  10 mL Intravenous PRN Lequita Asal, MD   10 mL at 04/17/18 0844  . sodium chloride flush (NS) 0.9 % injection 10 mL  10 mL Intravenous PRN Nolon Stalls C, MD   10 mL at 08/01/18 0900    Review of Systems  Constitutional: Positive for weight loss (10 pounds). Negative for diaphoresis, fever and malaise/fatigue.       Feels "alright".  HENT: Positive for congestion. Negative for ear discharge, ear pain, nosebleeds, sinus pain, sore throat and tinnitus.        Mild cold symptoms.  Eyes: Positive for blurred vision. Negative for double vision, photophobia, pain, discharge and redness.  Respiratory: Negative.  Negative for cough, hemoptysis, sputum production and shortness of breath.   Cardiovascular: Negative.  Negative for chest pain, palpitations, orthopnea, leg swelling and PND.  Gastrointestinal: Negative.  Negative for abdominal pain, blood in stool, constipation,  diarrhea, melena, nausea and vomiting.  Genitourinary: Negative.  Negative for dysuria, frequency, hematuria and urgency.  Musculoskeletal: Negative.  Negative for back pain, falls, joint pain, myalgias and neck pain.  Skin: Negative.  Negative for itching and rash.  Neurological: Negative for dizziness, tremors, sensory change, speech change, focal weakness, weakness and headaches.  Endo/Heme/Allergies: Does not bruise/bleed easily.       Diabetes. Vasomotor symptoms.   Psychiatric/Behavioral: Negative for depression and memory loss. The patient is not nervous/anxious and does not have insomnia.   All other systems reviewed and are negative.  Performance status (ECOG): 1  Vital Signs BP (!) 135/99 (BP Location: Right Arm, Patient Position: Sitting)   Pulse 92   Temp (!) 97 F (36.1 C) (Tympanic)   Resp 18   Ht _0  (1.702 m)   Wt 182 lb 3.7 oz (82.7 kg)   SpO2 100%   BMI 28.54 kg/m   Physical Exam  Constitutional: She is oriented to person, place, and time and well-developed,  well-nourished, and in no distress. No distress.  HENT:  Head: Normocephalic and atraumatic.  Mouth/Throat: Oropharynx is clear and moist and mucous membranes are normal. No oropharyngeal exudate.  Short dark hair.  Eyes: Pupils are equal, round, and reactive to light. Conjunctivae and EOM are normal. Right conjunctiva is not injected. Right conjunctiva has no hemorrhage. Left conjunctiva is not injected. Left conjunctiva has no hemorrhage. No scleral icterus. Right eye exhibits normal extraocular motion. Left eye exhibits normal extraocular motion.  Fundoscopic exam:      The right eye shows no exudate, no hemorrhage and no papilledema.       The left eye shows no exudate, no hemorrhage and no papilledema.  Neck: Normal range of motion. Neck supple. No tracheal deviation present. No thyromegaly present.  Cardiovascular: Normal rate, regular rhythm, normal heart sounds and intact distal pulses. Exam reveals no gallop and no friction rub.  No murmur heard. Pulmonary/Chest: Effort normal and breath sounds normal. No respiratory distress. She has no wheezes. She has no rales.  Abdominal: Soft. Bowel sounds are normal. She exhibits no distension. There is no abdominal tenderness.  Musculoskeletal: Normal range of motion.        General: No tenderness or edema.  Lymphadenopathy:    She has no cervical adenopathy.    She has no axillary adenopathy.       Right: No supraclavicular adenopathy present.       Left: No supraclavicular adenopathy present.  Neurological: She is alert and oriented to person, place, and time.  Skin: Skin is warm and dry. No rash noted. She is not diaphoretic. No erythema.  Psychiatric: Mood, affect and judgment normal.  Nursing note and vitals reviewed.   Infusion on 08/01/2018  Component Date Value Ref Range Status  . Magnesium 08/01/2018 1.9  1.7 - 2.4 mg/dL Final   Performed at [facility], [address] [place], [place] [address]  . Sodium  08/01/2018 140  135 - 145 mmol/L Final  . Potassium 08/01/2018 3.7  3.5 - 5.1 mmol/L Final  . Chloride 08/01/2018 103  98 - 111 mmol/L Final  . CO2 08/01/2018 27  22 - 32 mmol/L Final  . Glucose, Bld 08/01/2018 133* 70 - 99 mg/dL Final  . BUN 08/01/2018 12  6 - 20 mg/dL Final  . Creatinine, Ser 08/01/2018 0.64  0.44 - 1.00 mg/dL Final  . Calcium 08/01/2018 9.3  8.9 - 10.3 mg/dL Final  . Total Protein 08/01/2018  7.4  6.5 - 8.1 g/dL Final  . Albumin 08/01/2018 4.1  3.5 - 5.0 g/dL Final  . AST 08/01/2018 40  15 - 41 U/L Final  . ALT 08/01/2018 49* 0 - 44 U/L Final  . Alkaline Phosphatase 08/01/2018 92  38 - 126 U/L Final  . Total Bilirubin 08/01/2018 1.0  0.3 - 1.2 mg/dL Final  . GFR calc non Af Amer 08/01/2018 >60  >60 mL/min Final  . GFR calc Af Amer 08/01/2018 >60  >60 mL/min Final  . Anion gap 08/01/2018 10  5 - 15 Final   Performed at [facility], [address], [place], [place] [address]  . WBC 08/01/2018 5.3  4.0 - 10.5 K/uL Final  . RBC 08/01/2018 4.46  3.87 - 5.11 MIL/uL Final  . Hemoglobin 08/01/2018 12.8  12.0 - 15.0 g/dL Final  . HCT 08/01/2018 38.2  36.0 - 46.0 % Final  . MCV 08/01/2018 85.7  80.0 - 100.0 fL Final  . MCH 08/01/2018 28.7  26.0 - 34.0 pg Final  . MCHC 08/01/2018 33.5  30.0 - 36.0 g/dL Final  . RDW 08/01/2018 12.6  11.5 - 15.5 % Final  . Platelets 08/01/2018 256  150 - 400 K/uL Final  . nRBC 08/01/2018 0.0  0.0 - 0.2 % Final  . Neutrophils Relative % 08/01/2018 70  % Final  . Neutro Abs 08/01/2018 3.8  1.7 - 7.7 K/uL Final  . Lymphocytes Relative 08/01/2018 17  % Final  . Lymphs Abs 08/01/2018 0.9  0.7 - 4.0 K/uL Final  . Monocytes Relative 08/01/2018 8  % Final  . Monocytes Absolute 08/01/2018 0.4  0.1 - 1.0 K/uL Final  . Eosinophils Relative 08/01/2018 3  % Final  . Eosinophils Absolute 08/01/2018 0.1  0.0 - 0.5 K/uL Final  . Basophils Relative 08/01/2018 1  % Final  . Basophils Absolute 08/01/2018 0.0  0.0 - 0.1 K/uL Final  . Immature  Granulocytes 08/01/2018 1  % Final  . Abs Immature Granulocytes 08/01/2018 0.03  0.00 - 0.07 K/uL Final   Performed at [facility], [address] [place], [place] [address]  . Preg Test, Ur 08/01/2018 NEGATIVE  NEGATIVE Final   Performed at [facility], [address] [place], [place] [address]    Assessment:  LAETITIA SCHNEPF is a 43 y.o. 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  Pathology revealed no residual carcinoma in the breast.  One of three sentinel lymph nodes were positive.  One lymph node had 2 metastatic foci (2.1 mm and 1 mm).  Pathologic stage revealed ypT0 ypN1a.  Index breast mass and axillary node biopsy on 12/06/2017 revealed grade II invasive ductal carcinoma with calcifications at the 11 o'clock position.  There was metastatic carcinoma in 1 of 1 lymph nodes.  Tumor was ER + (100%), PR + (100%), and Ki67 5%.  Her2/neu was 3+ by IHC (heterogeneous) and FISH +.  Diagnostic left mammogram and ultrasound on 12/06/2017 revealed a 1.7 x 1.3 x 1.3 cm irregular hypoechoic mass with internal calcifications at the 11 o'clock position 2 cm from the nipple with internal calcifications. There was a similar appearing 0.9 x 0.8 x 0.7 cm mass at the 11 o'clock position 4 cm from the nipple (2.4 cm from the index lesion).  There were suspicious, segmental calcifications originating from the index lesion and extending 5 cm posteriorly.  There was a 0.6 cm morphologically abnormal left axillary lymph node.  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 The adjacent and more superiorly located 11 o'clock mass (7 x 12 mm) seen on recent ultrasound, 4 cm from the nipple on ultrasound, was also identified. There were 2 probable satellite  lesions (7 mm, medial lesion; posterior and lateral lesion). The total span of malignancy was 3 x 3.1 x 4.5 cm.  There were calcifications extending up to 5 cm posterior to the biopsied malignancy which were highly suspicious on mammography but not appreciated.  There was a mass in the lower outer left breast measured 5 mm, representing a change.  The known metastatic node in the left axilla was identified. A node along the posterolateral margin of the pectoralis minor (cortex 5 mm) was nonspecific but at least somewhat suspicious.  Chest, abdomen, and pelvic CT on 12/19/2017 revealed a solitary enlarged biopsy-proven metastatic left axillary lymph node.  There were no additional findings suspicious for metastatic disease in the chest, abdomen or pelvis.  There was a nonspecific tiny sclerotic upper right sacral lesion, more likely a benign bone island. Bone scintigraphy correlation versus follow-up CT could be considered.  Bone scan on 01/11/2018 was negative for metastatic pattern. No areas of focal tracer uptake. Area of concern in sacrum on CT likely represents benign bone island.   [facility] genetic testing on 08/26/2013 was negative for BRCA1 and 2. 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  She has a family history significant for breast, ovarian, pancreatic, and prostate cancer.  She received 6 cycles of TCHP chemotherapy (12/21/2017 - 04/17/2018) with Margarette Canada support. Cycle #3 was held due to elevated LFTs on 02/04/2018. She received Herceptin + Perjeta alone (04/30/2018 - 05/30/2018).  She is s/p cycle #2 Kadcyla (06/20/2018 - 07/11/2018).  She began breast radiation on 06/26/2018  Echocardiograms:  12/20/2017 - EF of 60-65%, 03/27/2018 - EF of 60-65%, and 06/18/2018 - EF of 55-60%.  She has a history of elevated LFTs.  Hepatitis B and C serologies were negative on 02/04/2018.  RUQ ultrasound on 02/06/2018 revealed a small anterior gallbladder wall polyp. There was no  evidence of cholelithiasis or cholecystis. CBD measured normal at 2 mm, with no evidence of choledocholithiasis. There was normal direction of blood flow towards the liver noted.  Symptomatically, she notes blurry vision which began with Kadcyla.  Limited eye exam is unremarkable.  Plan: 1.   Labs:  CBC with diff, CMP, Mg, urine pregnancy test. 2.   Stage IB multifocal LEFT breast cancer    Clinically doing well.    Patient is undergoing radiation (last fraction anticipated 08/05/2018). Breast reconstruction planned 6 months post radiation. She is s/p 2 cycles of Kadcyla. Discuss concern regarding blurry vision.  Incidence of 4-5% with Kadcyla. Discuss postponing cycle #3 Kadcyla until seen by ophthalmology (see below). Discuss symptom management.  She has antiemetics and pain medications at home to use on a prn bases.  Interventions are adequate.    3.   Abnormal LFTs Labs reviewed. ALT is 49. Continue close monitoring. 4.   Vasomotor symptoms Patient doing well on Effexor. Continue to monitor. 5.   Ophthalmologic changes  Discuss changes which can be associated with Kadcyla   Increased lacrimation (3% to 6%), blurred vision (4% to 5%), dry eye syndrome (4% to 5%), conjunctivitis (4%)  Discuss blurry vision.  Literature search performed.  Paper briefly reviewed with patient.  Discuss awaiting eye exam and recommendations per ophthalmology on 08/02/2018.  Ensure symptoms not permanent before proceeding.  Contact [facility] regarding ophthalmologic symptoms noted with Kadcyla. 6.  Hypomagnesemia    Magnesium level is 1.9.    Patient taking oral magnesium occasionally. 7.   RTC in 1 week for MD assessment, labs (CBC with diff, CMP, Mg, urine preg), and cycle #3 Kadcyla.  Addendum:  Spoke with Dr Alinda Money at [facility] breast cancer clinic.  He spoke to his colleagues and pharmacist in clinic.  No one has seen this with Kadcyla.  A total of (20+) minutes of face-to-face time was  spent with the patient with greater than 50% of that time in counseling and care-coordination.    Honor Loh, NP  08/01/18, 10:06 AM   I saw and evaluated the patient, participating in the key portions of the service and reviewing pertinent diagnostic studies and records.  I reviewed the nurse practitioner's note and agree with the findings and the plan.  The assessment and plan were discussed with the patient. Multiple questions were asked by the patient and answered.   Nolon Stalls, MD 08/01/18, 10:06 AM

## 2018-08-01 NOTE — Progress Notes (Signed)
No new changes noted today 

## 2018-08-02 ENCOUNTER — Ambulatory Visit: Payer: BC Managed Care – PPO

## 2018-08-02 ENCOUNTER — Encounter: Payer: BC Managed Care – PPO | Admitting: Physical Therapy

## 2018-08-02 ENCOUNTER — Telehealth: Payer: Self-pay

## 2018-08-02 ENCOUNTER — Ambulatory Visit
Admission: RE | Admit: 2018-08-02 | Discharge: 2018-08-02 | Disposition: A | Discharge status: Home / Self Care | Payer: BC Managed Care – PPO | Source: Ambulatory Visit | Attending: Radiation Oncology | Admitting: Radiation Oncology

## 2018-08-02 DIAGNOSIS — C50412 Malignant neoplasm of upper-outer quadrant of left female breast: Secondary | ICD-10-CM | POA: Diagnosis not present

## 2018-08-02 NOTE — Telephone Encounter (Signed)
Pt confirms attending eye appointment today. Reports she advised [facility] to fax over letter informing of recommendations. Honor Loh, NP made aware

## 2018-08-05 ENCOUNTER — Ambulatory Visit
Admission: RE | Admit: 2018-08-05 | Discharge: 2018-08-05 | Disposition: A | Discharge status: Home / Self Care | Payer: BC Managed Care – PPO | Source: Ambulatory Visit | Attending: Radiation Oncology | Admitting: Radiation Oncology

## 2018-08-05 DIAGNOSIS — C50412 Malignant neoplasm of upper-outer quadrant of left female breast: Secondary | ICD-10-CM | POA: Diagnosis not present

## 2018-08-08 ENCOUNTER — Ambulatory Visit: Payer: BC Managed Care – PPO | Admitting: Physical Therapy

## 2018-08-08 ENCOUNTER — Encounter: Payer: Self-pay | Admitting: Physical Therapy

## 2018-08-08 ENCOUNTER — Other Ambulatory Visit: Payer: Self-pay

## 2018-08-08 ENCOUNTER — Encounter: Payer: Self-pay | Admitting: General Surgery

## 2018-08-08 ENCOUNTER — Ambulatory Visit (INDEPENDENT_AMBULATORY_CARE_PROVIDER_SITE_OTHER): Payer: BC Managed Care – PPO | Admitting: General Surgery

## 2018-08-08 VITALS — BP 137/91 | HR 83 | Temp 97.7°F | Resp 18 | Ht 67.0 in | Wt 181.0 lb

## 2018-08-08 DIAGNOSIS — Z170 Estrogen receptor positive status [ER+]: Secondary | ICD-10-CM

## 2018-08-08 DIAGNOSIS — M25512 Pain in left shoulder: Secondary | ICD-10-CM | POA: Insufficient documentation

## 2018-08-08 DIAGNOSIS — C50212 Malignant neoplasm of upper-inner quadrant of left female breast: Secondary | ICD-10-CM

## 2018-08-08 NOTE — Progress Notes (Addendum)
Patient ID: Denise [name], female   DOB: 1976-05-21, 43 y.o.   MRN: [id number]  Chief Complaint  Patient presents with  . Routine Post Op    2 mo post op-Left simple mastectomy with sentinel node biopsy-05/21/18    HPI Denise [name] is a 43 y.o. female here today for 2 mo post op-Left simple mastectomy with sentinel node biopsy-05/21/18. Patient states she is feeling well.  The patient had put a hold on her kycera therapy due to some visual abnormalities.  Recent evaluation by Pandora Leiter, MD from ophthalmology reported this was in the cornea.  Medical oncology is awaiting callback from the pharmaceutical manufacture regarding the likelihood that this is related to therapy.  HPI  Past Medical History:  Diagnosis Date  . Abnormal breast biopsy    PASH, Dr. Lemar Livings  . Family history of breast cancer   . Family history of ovarian cancer   . Fibroadenoma of breast, left 2000, 2016  . Genetic screening 08/26/2013   BRCA/BART negative/[facility], CHEK2 POS 2017  . Increased risk of breast cancer 2017   IBIS=47%  . Left breast lump 06/18/2017   Fluid/drained J Diksha Tagliaferro  . Migraine   . Monoallelic mutation of CHEK2 gene in female patient 2017   increased risk of breast and colon cancer    Past Surgical History:  Procedure Laterality Date  . BREAST BIOPSY Bilateral 09/08/14   neg  . BREAST BIOPSY Left 03/16/2015   Procedure: BREAST BIOPSY WITH NEEDLE LOCALIZATION;  Surgeon: Earline Mayotte, MD;  Location: [facility];  Service: General;  Laterality: Left;  . BREAST CYST ASPIRATION Left 06/18/2017   cyst aspiration  . BREAST EXCISIONAL BIOPSY Left 09/2014  . BREAST EXCISIONAL BIOPSY Right    age 82's  . BREAST RECONSTRUCTION WITH PLACEMENT OF TISSUE EXPANDER AND FLEX HD (ACELLULAR HYDRATED DERMIS) Left 05/20/2018   Procedure: BREAST RECONSTRUCTION WITH PLACEMENT OF TISSUE EXPANDER AND FLEX HD (ACELLULAR HYDRATED DERMIS);  Surgeon: Peggye Form, DO;  Location: [facility];   Service: Plastics;  Laterality: Left;  . BREAST SURGERY Right March 2000   benign fibroadenoma  . BREAST SURGERY Left 08/08/13   excision  . BREAST SURGERY Left 09/23/14   excision  . MASTECTOMY W/ SENTINEL NODE BIOPSY Left 05/20/2018   Procedure: MASTECTOMY WITH SENTINEL LYMPH NODE BIOPSY;  Surgeon: Earline Mayotte, MD;  Location: [facility];  Service: General;  Laterality: Left;  . PORTACATH PLACEMENT Right 12/17/2017   Procedure: INSERTION PORT-A-CATH;  Surgeon: Earline Mayotte, MD;  Location: [facility];  Service: General;  Laterality: Right;    Family History  Problem Relation Age of Onset  . Prostate cancer Father 54  . Breast cancer Paternal Grandmother 72  . Breast cancer Maternal Aunt 60  . Ovarian cancer Maternal Aunt 70  . Breast cancer Paternal Aunt 43  . Pancreatic cancer Maternal Aunt 28    Social History Social History   Tobacco Use  . Smoking status: Never Smoker  . Smokeless tobacco: Never Used  Substance Use Topics  . Alcohol use: Yes    Comment: occasionally  . Drug use: No    Allergies  Allergen Reactions  . Steri-Strip Compound Benzoin [Benzoin Compound]     Steri-strip ,   . Tape Other (See Comments)    Minor skin irritation     Current Outpatient Medications  Medication Sig Dispense Refill  . acetaminophen (TYLENOL) 500 MG tablet Take 500 mg by mouth every 6 (six) hours as needed for  moderate pain or headache.    . Chlorphen-Pseudoephed-APAP (CORICIDIN D PO) Take by mouth.    . diazepam (VALIUM) 2 MG tablet Take 2 mg by mouth every 6 (six) hours as needed for anxiety.    Marland Kitchen HYDROcodone-acetaminophen (NORCO) 5-325 MG tablet Take 1 tablet by mouth every 8 (eight) hours as needed for moderate pain. 20 tablet 0  . magnesium oxide (MAG-OX) 400 MG tablet Take 1 tablet (400 mg total) by mouth daily. 30 tablet 0  . omeprazole (PRILOSEC OTC) 20 MG tablet Take 20 mg by mouth daily as needed (for acid reflux).    . venlafaxine XR (EFFEXOR-XR) 37.5 MG 24 hr  capsule Take 1 capsule (37.5 mg total) by mouth daily with breakfast. 30 capsule 2   No current facility-administered medications for this visit.    Facility-Administered Medications Ordered in Other Visits  Medication Dose Route Frequency Provider Last Rate Last Dose  . sodium chloride flush (NS) 0.9 % injection 10 mL  10 mL Intravenous PRN [name], MD   10 mL at 04/17/18 0844    Review of Systems Review of Systems  Constitutional: Negative.   Respiratory: Negative.   Cardiovascular: Negative.     Blood pressure (!) 137/91, pulse 83, temperature 97.7 F (36.5 C), temperature source Temporal, resp. rate 18, height 5\' 7"  (1.702 m), weight 181 lb (82.1 kg), SpO2 99 %.  Physical Exam Physical Exam Constitutional:      Appearance: She is well-developed.  Eyes:     General: No scleral icterus.    Conjunctiva/sclera: Conjunctivae normal.  Neck:     Musculoskeletal: Normal range of motion.  Cardiovascular:     Rate and Rhythm: Normal rate and regular rhythm.     Heart sounds: Normal heart sounds.  Pulmonary:     Effort: Pulmonary effort is normal.     Breath sounds: Normal breath sounds.  Chest:     Breasts:        Right: No inverted nipple, mass, nipple discharge, skin change or tenderness.     Lymphadenopathy:     Cervical: No cervical adenopathy.     Upper Body:     Right upper body: No supraclavicular or axillary adenopathy.     Left upper body: No supraclavicular or axillary adenopathy.  Skin:    General: Skin is warm and dry.  Neurological:     Mental Status: She is alert and oriented to person, place, and time.     Data Reviewed Medical oncology notes of January 2020 reviewed.  Measurement of the upper extremity was obtained at a location 15 cm above as well as 10 and 20 cm below the olecranon process.  August 08, 2018: Right: 34, 28, 21 cm. Left:: 31.5, 27, 20.5 cm.  June 04, 2018: Right: 34.5, 28, 21 cm. Left: 35, 27, 20  cm.  Assessment Doing well post left mastectomy, adjuvant immunotherapy and radiation.   Plan The patient is still planning to have the right breast removed this calendar year.  Her genetic mutation puts her at a higher risk for colon cancer.  She is aware that a colonoscopy is appropriate when all the other deck chairs have been arranged.  She is looking to meet with Foster Simpson, DO in the next couple of weeks to resume inflations to allow placement of her final implant.  She has been asked to discuss her whether we could look at doing the right mastectomy while waiting for the final healing on the left side post  radiation or when we might look at doing a colonoscopy.  The goal to avoid colonoscopy and immediately around the time of any surgery or implant placement was reviewed.  We will plan to get a mammogram in June 2020 of the right breast unless there are plans for surgical intervention ahead of time.   HPI, Physical Exam, Assessment and Plan have been scribed under the direction and in the presence of Donnalee Curry, Md.  Buddy Duty R. Rubye Oaks, CMA  I have completed the exam and reviewed the above documentation for accuracy and completeness.  I agree with the above.  Museum/gallery conservator has been used and any errors in dictation or transcription are unintentional.  Donnalee Curry, M.D., F.A.C.S.  Merrily Pew Thereasa Iannello 08/08/2018, 8:23 PM

## 2018-08-08 NOTE — Patient Instructions (Addendum)
Patient will need to return to the office in June with right breast mammogram.   Call the office with any questions or concerns.

## 2018-08-08 NOTE — Therapy (Signed)
[facility] [address] [place], [address] Phone: [phone]   Fax:  [phone]  Physical Therapy Treatment  Patient Details  Name: Denise [name] MRN: [id number] Date of Birth: Mar 07, 1976 Referring Provider (PT): D[place]ham   Encounter Date: 08/08/2018  PT End of Session - 08/08/18 0076    Visit Number  7    Date for PT Re-Evaluation  08/14/18    PT Start Time  0400    PT Stop Time  0440    PT Time Calculation (min)  40 min    Activity Tolerance  Patient tolerated treatment well    Behavior During Therapy  [facility] for tasks assessed/performed       Past Medical History:  Diagnosis Date  . Abnormal breast biopsy    PASH, [name]  . Family history of breast cancer   . Family history of ovarian cancer   . Fibroadenoma of breast, left 2000, 2016  . Genetic screening 08/26/2013   BRCA/BART negative/[facility], CHEK2 POS 2017  . Increased risk of breast cancer 2017   IBIS=47%  . Left breast lump 06/18/2017   Fluid/drained [name]  . Migraine   . Monoallelic mutation of CHEK2 gene in female patient 2017   increased risk of breast and colon cancer    Past Surgical History:  Procedure Laterality Date  . BREAST BIOPSY Bilateral 09/08/14   neg  . BREAST BIOPSY Left 03/16/2015   Procedure: BREAST BIOPSY WITH NEEDLE LOCALIZATION;  Surgeon: [name], MD;  Location: [facility];  Service: General;  Laterality: Left;  . BREAST CYST ASPIRATION Left 06/18/2017   cyst aspiration  . BREAST EXCISIONAL BIOPSY Left 09/2014  . BREAST EXCISIONAL BIOPSY Right    age 19's  . BREAST RECONSTRUCTION WITH PLACEMENT OF TISSUE EXPANDER AND FLEX HD (ACELLULAR HYDRATED DERMIS) Left 05/20/2018   Procedure: BREAST RECONSTRUCTION WITH PLACEMENT OF TISSUE EXPANDER AND FLEX HD (ACELLULAR HYDRATED DERMIS);  Surgeon: [name], DO;  Location: [facility];  Service: Plastics;  Laterality: Left;  . BREAST SURGERY Right  March 2000   benign fibroadenoma  . BREAST SURGERY Left 08/08/13   excision  . BREAST SURGERY Left 09/23/14   excision  . MASTECTOMY W/ SENTINEL NODE BIOPSY Left 05/20/2018   Procedure: MASTECTOMY WITH SENTINEL LYMPH NODE BIOPSY;  Surgeon: [name], MD;  Location: [facility];  Service: General;  Laterality: Left;  . PORTACATH PLACEMENT Right 12/17/2017   Procedure: INSERTION PORT-A-CATH;  Surgeon: [name], MD;  Location: [facility];  Service: General;  Laterality: Right;    There were no vitals filed for this visit.  Subjective Assessment - 08/08/18 1600    Subjective  Patient reports she is having a lot of skin irritation at the upper chest and at the lat from radiation, which she finished Monday. Patient reports minimal pain in the shoulder with good motion. Reports she wants to forgo manual therapy this session d/t skin irritation.     Pertinent History  Patient is a 43 year old female with breast cancer history beginning in 2016 with multiple benign lumpectomies. Patient reports first malignant finding this past June 2019 in L breast, with following masectomy with saline expander placed 05/20/18. Patient reports chemo prior with radiation beginning next week 5days/week, for 6 weeks. Patient reports she is also planning to have R masectomy with saline expansion in the future, unscheduled. 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  Patient reports she is a mother of a 13 year old and 9 year old that she reports are very involved in extra-curriculars. Patient reports her ROM in the shoulder is improving, but that  she is having some pain in the inside elbow and into medial forearm with straightening. Patient reports some TTP at incision site, but most all pain at elbow into forearm. Worst pain in past week 7/10 and best 0/10.  Reports pain is sharp in the medial elbow and forearm; denies numbness or tingling    Limitations  House hold activities;Lifting    How  long can you sit comfortably?  unlimited    How long can you stand comfortably?  unlimited    How long can you walk comfortably?  unlimited    Pain Onset  1 to 4 weeks ago       Ther-Ex -Pec stretch on rolled towel in 90d and 120d 2min in each position - Supine IR stretch 1min  - Supine ER stretch with contract-relax 10sec contract 20sec relax x3 - Seated overhead press 4# 3x 10 with min cuing initially for proper form with good carry over following - Seated scaption 4# DB with TC to maintain shoulder scaption with good rhythm  - Standing rows 25# 3x 10 with good carry over from previous sessions - Standing IR and ER 5# 3x 8/9 each with towel for TC  - Prone snow angels 1# DB 3x 10 with min cuing for maintain scapular retraction with good carry over foolowing - Standing rows 25# 2x 10; 30# x10 with min cuing for set up posture with good carry over following                        PT Education - 08/08/18 1604    Education Details  Exercise form    Person(s) Educated  Patient    Methods  Explanation;Demonstration;Verbal cues    Comprehension  Verbalized understanding;Returned demonstration;Verbal cues required       PT Short Term Goals - 06/21/18 1143      PT SHORT TERM GOAL #1   Title  Pt will be independent with HEP in order to improve strength and motion, and to decrease pain in order to improve pain-free function at home and work.    Time  4    Period  Weeks    Status  New        PT Long Term Goals - 06/21/18 1144      PT LONG TERM GOAL #1   Title  Pt will decrease worst pain as reported on NPRS by at least 3 points in order to demonstrate clinically significant reduction in pain.    Baseline  06/20/18 7/10 with overhead motion with elbow ext    Time  8    Period  Weeks    Status  New      PT LONG TERM GOAL #2   Title  Patient will increase FOTO score to 72 to demonstrate predicted increase in functional mobility to complete ADLs    Baseline   06/20/18 58    Time  8    Period  Weeks    Status  New      PT LONG TERM GOAL #3   Title  Patient will demonstrate all active shoulder ROM within normal limits in order to complete ADLs    Baseline  06/20/18 see eval    Time  8    Period  Weeks      Status  New            Plan - 08/08/18 1624    Clinical Impression Statement  PT continued therex progression for postural musculature with cuing from PT needed for accuracy, with good carry over following. PT held off manual therapy today d/t skin irritation from radiation, and will continue as able next session     Rehab Potential  Good    Clinical Impairments Affecting Rehab Potential  (+) age, motivation, social support, (-) current sedentary lifestyle, ongoing cancer treatment, other comorbidities    PT Frequency  2x / week    PT Duration  8 weeks    PT Treatment/Interventions  ADLs/Self Care Home Management;Aquatic Therapy;Electrical Stimulation;Cryotherapy;Moist Heat;Iontophoresis 4mg/ml Dexamethasone;Functional mobility training;Therapeutic activities;Therapeutic exercise;Traction;Ultrasound;Neuromuscular re-education;Manual techniques;Patient/family education;Manual lymph drainage;Passive range of motion;Dry needling;Energy conservation;Taping    PT Next Visit Plan  Ease soft tissue restrictions, increase ROM as able    PT Home Exercise Plan  pulleys shoulder abd/flex, sidelying lat stretch, supine ER stretch, scar massage    Consulted and Agree with Plan of Care  Patient       Patient will benefit from skilled therapeutic intervention in order to improve the following deficits and impairments:  Decreased activity tolerance, Decreased endurance, Decreased range of motion, Decreased skin integrity, Hypomobility, Increased fascial restricitons, Impaired UE functional use, Improper body mechanics, Pain, Postural dysfunction, Impaired flexibility, Decreased scar mobility  Visit Diagnosis: Acute pain of left shoulder     Problem  List Patient Active Problem List   Diagnosis Date Noted  . Hot flashes due to menopause 06/18/2018  . Status post left mastectomy 06/04/2018  . S/P breast reconstruction 05/28/2018  . Breast cancer (HCC) 05/20/2018  . Vasomotor symptoms due to menopause 04/17/2018  . Elevated LFTs 03/17/2018  . Goals of care, counseling/discussion 03/17/2018  . Hypomagnesemia 03/04/2018  . Diarrhea 01/02/2018  . Oral candidiasis 12/31/2017  . Encounter for antineoplastic chemotherapy 12/21/2017  . Encounter for antineoplastic immunotherapy 12/21/2017  . Lymphadenopathy, axillary 12/06/2017  . 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 Benign breast cyst in female, left 06/19/2017  . Breast mass, right 12/05/2016  . 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 Monoallelic mutation of CHEK2 gene in female patient 06/20/2015  . Increased risk of breast cancer 06/20/2015  . Malignant neoplasm of upper-outer quadrant of female breast (HCC) 09/17/2014  . Flat epithelial atypia of breast 08/08/2013  . Fibroadenoma 07/31/2013   Chelsea Miller PT, DPT Chelsea Miller 08/08/2018, 4:41 PM  [facility] [address] [place], , [address] Phone: [phone]   Fax:  [phone]  Name: Denise [name] MRN: [id number] Date of Birth: 06/27/1975   

## 2018-08-12 ENCOUNTER — Encounter: Payer: Self-pay | Admitting: Urgent Care

## 2018-08-13 ENCOUNTER — Encounter: Payer: Self-pay | Admitting: Physical Therapy

## 2018-08-13 ENCOUNTER — Encounter: Payer: Self-pay | Admitting: General Surgery

## 2018-08-13 ENCOUNTER — Other Ambulatory Visit: Payer: Self-pay

## 2018-08-13 ENCOUNTER — Ambulatory Visit: Payer: BC Managed Care – PPO | Admitting: Physical Therapy

## 2018-08-13 ENCOUNTER — Ambulatory Visit (INDEPENDENT_AMBULATORY_CARE_PROVIDER_SITE_OTHER): Payer: BC Managed Care – PPO | Admitting: Plastic Surgery

## 2018-08-13 VITALS — BP 125/85 | HR 87 | Temp 97.9°F | Ht 67.0 in | Wt 183.0 lb

## 2018-08-13 DIAGNOSIS — Z9889 Other specified postprocedural states: Secondary | ICD-10-CM

## 2018-08-13 DIAGNOSIS — M25512 Pain in left shoulder: Secondary | ICD-10-CM | POA: Diagnosis not present

## 2018-08-13 DIAGNOSIS — N651 Disproportion of reconstructed breast: Secondary | ICD-10-CM

## 2018-08-13 DIAGNOSIS — Z9882 Breast implant status: Secondary | ICD-10-CM

## 2018-08-13 DIAGNOSIS — Z9012 Acquired absence of left breast and nipple: Secondary | ICD-10-CM

## 2018-08-13 DIAGNOSIS — Z1231 Encounter for screening mammogram for malignant neoplasm of breast: Secondary | ICD-10-CM

## 2018-08-13 NOTE — Therapy (Signed)
[facility] [address] [place], [address] Phone: [phone]   Fax:  [phone]  Physical Therapy Treatment  Patient Details  Name: Denise [name] MRN: [id number] Date of Birth: 1976-05-12 Referring Provider (PT): Dillingham   Encounter Date: 08/13/2018  PT End of Session - 08/13/18 1745    Visit Number  8    Number of Visits  17    Date for PT Re-Evaluation  08/14/18    PT Start Time  0315    PT Stop Time  0400    PT Time Calculation (min)  45 min    Activity Tolerance  Patient tolerated treatment well    Behavior During Therapy  [facility] for tasks assessed/performed       Past Medical History:  Diagnosis Date  . Abnormal breast biopsy    PASH, [name]  . Family history of breast cancer   . Family history of ovarian cancer   . Fibroadenoma of breast, left 2000, 2016  . Genetic screening 08/26/2013   BRCA/BART negative/Myriad, CHEK2 POS 2017  . Increased risk of breast cancer 2017   IBIS=47%  . Left breast lump 06/18/2017   Fluid/drained [name]  . Migraine   . Monoallelic mutation of CHEK2 gene in female patient 2017   increased risk of breast and colon cancer    Past Surgical History:  Procedure Laterality Date  . BREAST BIOPSY Bilateral 09/08/14   neg  . BREAST BIOPSY Left 03/16/2015   Procedure: BREAST BIOPSY WITH NEEDLE LOCALIZATION;  Surgeon: [name], MD;  Location: [facility] ORS;  Service: General;  Laterality: Left;  . BREAST CYST ASPIRATION Left 06/18/2017   cyst aspiration  . BREAST EXCISIONAL BIOPSY Left 09/2014  . BREAST EXCISIONAL BIOPSY Right    age 43's  . BREAST RECONSTRUCTION WITH PLACEMENT OF TISSUE EXPANDER AND FLEX HD (ACELLULAR HYDRATED DERMIS) Left 05/20/2018   Procedure: BREAST RECONSTRUCTION WITH PLACEMENT OF TISSUE EXPANDER AND FLEX HD (ACELLULAR HYDRATED DERMIS);  Surgeon: [name], DO;  Location: [facility] ORS;  Service: Plastics;  Laterality: Left;   . BREAST SURGERY Right March 2000   benign fibroadenoma  . BREAST SURGERY Left 08/08/13   excision  . BREAST SURGERY Left 09/23/14   excision  . MASTECTOMY W/ SENTINEL NODE BIOPSY Left 05/20/2018   Procedure: MASTECTOMY WITH SENTINEL LYMPH NODE BIOPSY;  Surgeon: [name], MD;  Location: [facility];  Service: General;  Laterality: Left;  . PORTACATH PLACEMENT Right 12/17/2017   Procedure: INSERTION PORT-A-CATH;  Surgeon: [name], MD;  Location: [facility] ORS;  Service: General;  Laterality: Right;    There were no vitals filed for this visit.  Subjective Assessment - 08/13/18 1437    Subjective  Patient reports she had 50cc injection this am and is having some increased tension but ROM in the shoulder is still going well. Patient reports some continued skin irritation on superior R shoulder with other irritation feeling better.     Pertinent History  Patient is a 43 year old female with breast cancer history beginning in 2016 with multiple benign lumpectomies. Patient reports first malignant finding this past June 2019 in L breast, with following masectomy with saline expander placed 05/20/18. Patient reports chemo prior with radiation beginning next week 5days/week, for 6 weeks. Patient reports she is also planning to have R masectomy with saline expansion in the future, unscheduled. 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  Patient reports she is a mother of a 41 year old and 41 year old that she reports are very involved in extra-curriculars. Patient reports her ROM in the shoulder is improving, but that  she is having some pain in the inside elbow and into medial forearm with straightening. Patient reports some TTP at incision site, but most all pain at elbow into forearm. Worst pain in past week 7/10 and best 0/10.  Reports pain is sharp in the medial elbow and forearm; denies numbness or tingling    Limitations  House hold activities;Lifting    How long can  you sit comfortably?  unlimited    How long can you stand comfortably?  unlimited    How long can you walk comfortably?  unlimited    Patient Stated Goals  Decrease pain    Pain Onset  1 to 4 weeks ago         Ther-Ex -TRX pull ups 3x 10 attempting more parallel position each set; good form following demo and min cuing for scapular retraction - High rows 15# 3x10 with min cuing for scapular retraction  - Standing rows 25# 3x 10 with good carry over from previous sessions  Manual - STM withtrigger point releaseto L pec minor/major, and latissimus/teresminor/majorIncreased time spent here as patient has increased tension following expansion injection  - Multiple bouts of PROM in all directions with patient demonstrating near full ROM following manual techniques                       PT Education - 08/13/18 1502    Education Details  Exercise form    Person(s) Educated  Patient    Methods  Explanation;Demonstration;Verbal cues    Comprehension  Verbalized understanding;Returned demonstration;Verbal cues required       PT Short Term Goals - 06/21/18 1143      PT SHORT TERM GOAL #1   Title  Pt will be independent with HEP in order to improve strength and motion, and to decrease pain in order to improve pain-free function at home and work.    Time  4    Period  Weeks    Status  New        PT Long Term Goals - 06/21/18 1144      PT LONG TERM GOAL #1   Title  Pt will decrease worst pain as reported on NPRS by at least 3 points in order to demonstrate clinically significant reduction in pain.    Baseline  06/20/18 7/10 with overhead motion with elbow ext    Time  8    Period  Weeks    Status  New      PT LONG TERM GOAL #2   Title  Patient will increase FOTO score to 72 to demonstrate predicted increase in functional mobility to complete ADLs    Baseline  06/20/18 58    Time  8    Period  Weeks    Status  New      PT LONG TERM GOAL #3   Title  Patient  will demonstrate all active shoulder ROM within normal limits in order to complete ADLs    Baseline  06/20/18 see eval    Time  8    Period  Weeks    Status  New            Plan - 08/13/18 1749    Clinical Impression Statement  Patient with increased tension this session  following expansion injection yesterday, requiring increased manual techniques to resolve with good resolution following and full shoulder ROM following. PT continued some therex progression for postural musculatrue with remaining time which patient is able to complete with accuracy following PT demo and min VC.     Rehab Potential  Good    Clinical Impairments Affecting Rehab Potential  (+) age, motivation, social support, (-) current sedentary lifestyle, ongoing cancer treatment, other comorbidities    PT Frequency  2x / week    PT Duration  8 weeks    PT Treatment/Interventions  ADLs/Self Care Home Management;Aquatic Therapy;Electrical Stimulation;Cryotherapy;Moist Heat;Iontophoresis 35m/ml Dexamethasone;Functional mobility training;Therapeutic activities;Therapeutic exercise;Traction;Ultrasound;Neuromuscular re-education;Manual techniques;Patient/family education;Manual lymph drainage;Passive range of motion;Dry needling;Energy conservation;Taping    PT Next Visit Plan  Ease soft tissue restrictions, increase ROM as able    PT Home Exercise Plan  pulleys shoulder abd/flex, sidelying lat stretch, supine ER stretch, scar massage    Consulted and Agree with Plan of Care  Patient       Patient will benefit from skilled therapeutic intervention in order to improve the following deficits and impairments:  Decreased activity tolerance, Decreased endurance, Decreased range of motion, Decreased skin integrity, Hypomobility, Increased fascial restricitons, Impaired UE functional use, Improper body mechanics, Pain, Postural dysfunction, Impaired flexibility, Decreased scar mobility  Visit Diagnosis: Acute pain of left  shoulder     Problem List Patient Active Problem List   Diagnosis Date Noted  . Breast asymmetry following reconstructive surgery 08/13/2018  . Hot flashes due to menopause 06/18/2018  . Status post left mastectomy 06/04/2018  . S/P breast reconstruction 05/28/2018  . Vasomotor symptoms due to menopause 04/17/2018  . Elevated LFTs 03/17/2018  . Goals of care, counseling/discussion 03/17/2018  . Hypomagnesemia 03/04/2018  . Diarrhea 01/02/2018  . Encounter for antineoplastic chemotherapy 12/21/2017  . Encounter for antineoplastic immunotherapy 12/21/2017  . 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 Breast mass, right 12/05/2016  . Monoallelic mutation of CHEK2 gene in female patient 06/20/2015  . [place]ignant [place] of [place]outer [place] of [place] 09[place]17[place]   CShelton SilvasPT, DPT CShelton Silvas2/25/2020, 5:51 PM  [facility] [address] [place] [address]Phone: [phone]  Fax:  [phone] Name: ELATIMA HAMZAMRN: [id number] of Birth: [birth year]

## 2018-08-13 NOTE — Progress Notes (Signed)
   Subjective:    Patient ID: Denise [name], female    DOB: 04-10-1976, 43 y.o.   MRN: [id number]  The patient is a 43 year old female here for evaluation of her breasts.  She underwent a left mastectomy with expander placement.  She then had to have radiation.  She finished radiation 2 weeks ago.  She has some post radiation burn on the left shoulder and back area.  The incision is well-healed.  She does want to continue with filling.  She is also planning on a right mastectomy and interested in immediate reconstruction with expander and Flex HD placement.  Overall she is doing very well and has not had any recent illnesses.  Review of Systems  Constitutional: Negative.   HENT: Negative.   Eyes: Negative.   Respiratory: Negative.   Cardiovascular: Negative.   Gastrointestinal: Negative.   Endocrine: Negative.   Genitourinary: Negative.   Musculoskeletal: Negative.   Skin: Positive for wound.       Objective:   Physical Exam Vitals signs and nursing note reviewed.  Constitutional:      Appearance: Normal appearance.  HENT:     Head: Normocephalic.  Cardiovascular:     Rate and Rhythm: Normal rate.  Pulmonary:     Effort: Pulmonary effort is normal.  Chest:    Neurological:     Mental Status: She is alert.  Psychiatric:        Mood and Affect: Mood normal.        Thought Content: Thought content normal.        Judgment: Judgment normal.        Assessment & Plan:  Status post left mastectomy  S/P breast reconstruction  Breast asymmetry following reconstructive surgery We placed injectable saline in the Expander using a sterile technique: Left: 50 cc for a total of 400 / 375 cc  Plan for right breast immediate reconstruction after mastectomy.  We talked about the possibility of exchange on the left versus latissimus.  The patient would like to try to get everything done prior to the end of 2020.

## 2018-08-14 ENCOUNTER — Telehealth: Payer: Self-pay

## 2018-08-14 NOTE — Telephone Encounter (Signed)
Patient called to see about setting up surgery with Dr [name] and Dr Bary Castilla for right mastectomy with immediate reconstruction. Per Dr Bary Castilla she will need a right screening mammogram prior to surgery to assess for any abnormalities in the breast that may warrant the need for sentinel node biopsy with surgery. I did inform the patient that her insurance may not pay for the mammogram because she is not due for her annual mammogram until June. She is aware and will call her insurance to check on this.  I have sent a message to Dr Eusebio Friendly office to see about setting up her surgery.

## 2018-08-14 NOTE — Telephone Encounter (Signed)
The patient called back and she has spoken with her insurance company. They told her because this was a new revenue cycle that her mammogram should be covered. She will call [place] to get this scheduled.

## 2018-08-15 ENCOUNTER — Ambulatory Visit: Payer: BC Managed Care – PPO | Admitting: Physical Therapy

## 2018-08-15 ENCOUNTER — Encounter: Payer: Self-pay | Admitting: Physical Therapy

## 2018-08-15 DIAGNOSIS — M25512 Pain in left shoulder: Secondary | ICD-10-CM | POA: Diagnosis not present

## 2018-08-15 NOTE — Therapy (Signed)
[facility] [address] [place], [address] Phone: [phone]   Fax:  [phone]  Physical Therapy Treatment  Patient Details  Name: Denise [name] MRN: [id number] Date of Birth: June 09, 1976 Referring Provider (PT): Dillingham   Encounter Date: 08/15/2018  PT End of Session - 08/15/18 1625    Visit Number  9    Number of Visits  17    Date for PT Re-Evaluation  08/14/18    PT Start Time  0400    PT Stop Time  0445    PT Time Calculation (min)  45 min    Activity Tolerance  Patient tolerated treatment well    Behavior During Therapy  [facility] for tasks assessed/performed       Past Medical History:  Diagnosis Date  . Abnormal breast biopsy    PASH, [name]  . Family history of breast cancer   . Family history of ovarian cancer   . Fibroadenoma of breast, left 2000, 2016  . Genetic screening 08/26/2013   BRCA/BART negative/Myriad, CHEK2 POS 2017  . Increased risk of breast cancer 2017   IBIS=47%  . Left breast lump 06/18/2017   Fluid/drained [name]  . Migraine   . Monoallelic mutation of CHEK2 gene in female patient 2017   increased risk of breast and colon cancer    Past Surgical History:  Procedure Laterality Date  . BREAST BIOPSY Bilateral 09/08/14   neg  . BREAST BIOPSY Left 03/16/2015   Procedure: BREAST BIOPSY WITH NEEDLE LOCALIZATION;  Surgeon: [name], MD;  Location: [facility];  Service: General;  Laterality: Left;  . BREAST CYST ASPIRATION Left 06/18/2017   cyst aspiration  . BREAST EXCISIONAL BIOPSY Left 09/2014  . BREAST EXCISIONAL BIOPSY Right    age 8's  . BREAST RECONSTRUCTION WITH PLACEMENT OF TISSUE EXPANDER AND FLEX HD (ACELLULAR HYDRATED DERMIS) Left 05/20/2018   Procedure: BREAST RECONSTRUCTION WITH PLACEMENT OF TISSUE EXPANDER AND FLEX HD (ACELLULAR HYDRATED DERMIS);  Surgeon: [name], DO;  Location: [facility];  Service: Plastics;  Laterality: Left;   . BREAST SURGERY Right March 2000   benign fibroadenoma  . BREAST SURGERY Left 08/08/13   excision  . BREAST SURGERY Left 09/23/14   excision  . MASTECTOMY W/ SENTINEL NODE BIOPSY Left 05/20/2018   Procedure: MASTECTOMY WITH SENTINEL LYMPH NODE BIOPSY;  Surgeon: [name], MD;  Location: [facility] OR[facility];  Service: General;  Laterality: Left;  . PORTACATH PLACEMENT Right 12/17/2017   Procedure: INSERTION PORT-A-CATH;  Surgeon: [name], MD;  Location: [facility] OR[facility];  Service: General;  Laterality: Right;    There were no vitals filed for this visit.  Subjective Assessment - 08/15/18 1606    Subjective  Patient reports she is having some tension with minimal pain at the axilla and chest. 1/10 pain today. Patient reports good motion overall.     Pertinent History  Patient is a 43 year old female with breast cancer history beginning in 2016 with multiple benign lumpectomies. Patient reports first malignant finding this past June 2019 in L breast, with following masectomy with saline expander placed 05/20/18. Patient reports chemo prior with radiation beginning next week 5days/week, for 6 weeks. Patient reports she is also planning to have R masectomy with saline expansion in the future, unscheduled. 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 Patient reports her ROM in the shoulder is improving, but that  she is having some pain in the inside elbow and into medial forearm with straightening. Patient reports some TTP at incision site, but most all pain at elbow into forearm. Worst pain in past week 7/10 and best 0/10.  Reports pain is sharp in the medial elbow and forearm; denies numbness or tingling    Limitations  House hold activities;Lifting    How long can you sit comfortably?  unlimited    How long can you stand comfortably?  unlimited    How long  can you walk comfortably?  unlimited    Patient Stated Goals  Decrease pain    Pain Onset  1 to 4 weeks ago          Ther-Ex -TRX pull ups 3x 10 attempting more parallel position each set; good carry over from last sesison with cuing to increase challenge - Scaption 2# DB 3x 10 with min cuing to maintain scapular retraction throughout exercise - Wall push up plus 3x 10 with cuing initially for proper form with good carry over following min TC for full ROM -Lat pulldown 35# 3x 10 with good carry over from previous sessions with min cuing to prevent shoulder hiking   Manual - STM withtrigger point releaseto L pec minor/major, and latissimus/teresminor/majorIncreased time spent here as patient has increased tension following expansion injection  - Multiple bouts of PROM in all directions with patient demonstrating near full ROM following manual techniques                      PT Education - 08/15/18 1625    Education Details  Exercise form    Person(s) Educated  Patient    Methods  Explanation;Demonstration;Verbal cues    Comprehension  Verbalized understanding;Returned demonstration;Verbal cues required       PT Short Term Goals - 06/21/18 1143      PT SHORT TERM GOAL #1   Title  Pt will be independent with HEP in order to improve strength and motion, and to decrease pain in order to improve pain-free function at home and work.    Time  4    Period  Weeks    Status  New        PT Long Term Goals - 06/21/18 1144      PT LONG TERM GOAL #1   Title  Pt will decrease worst pain as reported on NPRS by at least 3 points in order to demonstrate clinically significant reduction in pain.    Baseline  06/20/18 7/10 with overhead motion with elbow ext    Time  8    Period  Weeks    Status  New      PT LONG TERM GOAL #2   Title  Patient will increase FOTO score to 72 to demonstrate predicted increase in functional mobility to complete ADLs    Baseline  06/20/18  58    Time  8    Period  Weeks    Status  New      PT LONG TERM GOAL #3   Title  Patient will demonstrate all active shoulder ROM within normal limits in order to complete ADLs    Baseline  06/20/18 see eval    Time  8    Period  Weeks    Status  New  Plan - 08/15/18 1648    Clinical Impression Statement  Patient with better motion and decreased tension following last session, with some remaining painful trigger points, that resolve 75% with manual techniques, with full, painfree ROM folloiwng. PT continued postural strengthening therex, which patient is able to complete with accuracy following cuing.     Rehab Potential  Good    Clinical Impairments Affecting Rehab Potential  (+) age, motivation, social support, (-) current sedentary lifestyle, ongoing cancer treatment, other comorbidities    PT Frequency  2x / week    PT Duration  8 weeks    PT Treatment/Interventions  ADLs/Self Care Home Management;Aquatic Therapy;Electrical Stimulation;Cryotherapy;Moist Heat;Iontophoresis 5m/ml Dexamethasone;Functional mobility training;Therapeutic activities;Therapeutic exercise;Traction;Ultrasound;Neuromuscular re-education;Manual techniques;Patient/family education;Manual lymph drainage;Passive range of motion;Dry needling;Energy conservation;Taping    PT Next Visit Plan  Ease soft tissue restrictions, increase ROM as able    PT Home Exercise Plan  pulleys shoulder abd/flex, sidelying lat stretch, supine ER stretch, scar massage    Consulted and Agree with Plan of Care  Patient       Patient will benefit from skilled therapeutic intervention in order to improve the following deficits and impairments:  Decreased activity tolerance, Decreased endurance, Decreased range of motion, Decreased skin integrity, Hypomobility, Increased fascial restricitons, Impaired UE functional use, Improper body mechanics, Pain, Postural dysfunction, Impaired flexibility, Decreased scar mobility  Visit  Diagnosis: Acute pain of left shoulder     Problem List Patient Active Problem List   Diagnosis Date Noted  . Breast asymmetry following reconstructive surgery 08/13/2018  . Hot flashes due to menopause 06/18/2018  . Status post left mastectomy 06/04/2018  . S/P breast reconstruction 05/28/2018  . Vasomotor symptoms due to menopause 04/17/2018  . Elevated LFTs 03/17/2018  . Goals of care, counseling/discussion 03/17/2018  . Hypomagnesemia 03/04/2018  . Diarrhea 01/02/2018  . Encounter for antineoplastic chemotherapy 12/21/2017  . Encounter for antineoplastic immunotherapy 12/21/2017  . 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 Breast mass, right 12/05/2016  . Monoallelic mutation of CHEK2 gene in female patient 06/20/2015  . Malignant neoplasm of upper-outer quadrant of female breast ([place] 09/17/2014   CShelton SilvasPT, DPT CShelton Silvas2/27/2020, 4:49 PM  [facility] [address] [place] [address]Phone: [phone]  Fax:  [phone] Name: Denise SOLBERGMRN: [id number] of Birth: [birth year]

## 2018-08-16 ENCOUNTER — Encounter: Payer: Self-pay | Admitting: Urgent Care

## 2018-08-19 ENCOUNTER — Ambulatory Visit: Payer: BC Managed Care – PPO | Attending: Plastic Surgery | Admitting: Physical Therapy

## 2018-08-19 ENCOUNTER — Other Ambulatory Visit: Payer: Self-pay

## 2018-08-19 DIAGNOSIS — Z5112 Encounter for antineoplastic immunotherapy: Secondary | ICD-10-CM

## 2018-08-19 DIAGNOSIS — M25512 Pain in left shoulder: Secondary | ICD-10-CM | POA: Diagnosis not present

## 2018-08-19 DIAGNOSIS — C50419 Malignant neoplasm of upper-outer quadrant of unspecified female breast: Secondary | ICD-10-CM

## 2018-08-19 NOTE — Therapy (Signed)
[facility] [address] [place], [address] Phone: [phone]   Fax:  [phone]  Physical Therapy Treatment  Patient Details  Name: Denise [name] MRN: [id number] Date of Birth: 1975-10-20 Referring Provider (PT): [place]   Encounter Date: 08/19/2018    Past Medical History:  Diagnosis Date  . Abnormal breast biopsy    [facility], [name]  . Family history of breast cancer   . Family history of ovarian cancer   . Fibroadenoma of breast, left 2000, 2016  . Genetic screening 08/26/2013   BRCA/BART negative/[facility], CHEK2 POS 2017  . Increased risk of breast cancer 2017   IBIS=47%  . Left breast lump 06/18/2017   Fluid/drained [name]  . Migraine   . Monoallelic mutation of CHEK2 gene in female patient 2017   increased risk of breast and colon cancer    Past Surgical History:  Procedure Laterality Date  . BREAST BIOPSY Bilateral 09/08/14   neg  . BREAST BIOPSY Left 03/16/2015   Procedure: BREAST BIOPSY WITH NEEDLE LOCALIZATION;  Surgeon: [name], MD;  Location: [facility];  Service: General;  Laterality: Left;  . BREAST CYST ASPIRATION Left 06/18/2017   cyst aspiration  . BREAST EXCISIONAL BIOPSY Left 09/2014  . BREAST EXCISIONAL BIOPSY Right    age 68's  . BREAST RECONSTRUCTION WITH PLACEMENT OF TISSUE EXPANDER AND FLEX HD (ACELLULAR HYDRATED DERMIS) Left 05/20/2018   Procedure: BREAST RECONSTRUCTION WITH PLACEMENT OF TISSUE EXPANDER AND FLEX HD (ACELLULAR HYDRATED DERMIS);  Surgeon: [name], DO;  Location: [facility];  Service: Plastics;  Laterality: Left;  . BREAST SURGERY Right March 2000   benign fibroadenoma  . BREAST SURGERY Left 08/08/13   excision  . BREAST SURGERY Left 09/23/14   excision  . MASTECTOMY W/ SENTINEL NODE BIOPSY Left 05/20/2018   Procedure: MASTECTOMY WITH SENTINEL LYMPH NODE BIOPSY;  Surgeon: [name], MD;  Location: [facility];  Service:  General;  Laterality: Left;  . PORTACATH PLACEMENT Right 12/17/2017   Procedure: INSERTION PORT-A-CATH;  Surgeon: [name], MD;  Location: [facility];  Service: General;  Laterality: Right;    There were no vitals filed for this visit.    Ther-Ex -TRX pull ups in neutral 3x 10 attempting more parallel position each set; good carry over from last sesison  -TRX high row inverted 3x 10 with demo and cuing initially for proper form with good carry over following - Small body blade side to side 30sec and up and down 30sec 3x each  - Reviewed HEP and progress thus far with patient. Made decision to continued with PT 1x every other week until R mastectomy/ extender placementto maintain strength gains, with HEP, to save visits for RUE rehab as well and rehab following silicone placement  Manual - STM withtrigger point releaseto L pec minor/major, and intercostals. Increased time spent at pec musculature as patient has increased tension following expansion injection - Multiple bouts of PROM in all directions with patient demonstrating near full ROM following manual techniques                           PT Short Term Goals - 06/21/18 1143      PT SHORT TERM GOAL #1   Title  Pt will be independent with HEP in order to improve strength and motion, and to decrease pain in order to improve pain-free function at home and work.  Time  4    Period  Weeks    Status  New        PT Long Term Goals - 06/21/18 1144      PT LONG TERM GOAL #1   Title  Pt will decrease worst pain as reported on NPRS by at least 3 points in order to demonstrate clinically significant reduction in pain.    Baseline  06/20/18 7/10 with overhead motion with elbow ext    Time  8    Period  Weeks    Status  New      PT LONG TERM GOAL #2   Title  Patient will increase FOTO score to 72 to demonstrate predicted increase in functional mobility to complete ADLs    Baseline  06/20/18 58    Time   8    Period  Weeks    Status  New      PT LONG TERM GOAL #3   Title  Patient will demonstrate all active shoulder ROM within normal limits in order to complete ADLs    Baseline  06/20/18 see eval    Time  8    Period  Weeks    Status  New              Patient will benefit from skilled therapeutic intervention in order to improve the following deficits and impairments:     Visit Diagnosis: No diagnosis found.     Problem List Patient Active Problem List   Diagnosis Date Noted  . Breast asymmetry following reconstructive surgery 08/13/2018  . Hot flashes due to menopause 06/18/2018  . Status post left mastectomy 06/04/2018  . S/P breast reconstruction 05/28/2018  . Vasomotor symptoms due to menopause 04/17/2018  . Elevated LFTs 03/17/2018  . Goals of care, counseling/discussion 03/17/2018  . Hypomagnesemia 03/04/2018  . Diarrhea 01/02/2018  . Encounter for antineoplastic chemotherapy 12/21/2017  . Encounter for antineoplastic immunotherapy 12/21/2017  . 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 Breast mass, right 12/05/2016  . Monoallelic mutation of CHEK2 gene in female patient 06/20/2015  . Malignant neoplasm of upper-outer quadrant of female breast ([place]) 09/17/2014    Shelton Silvas 08/19/2018, 3:25 PM  [facility] [address] [place], [address] Phone: [phone]   Fax:  [phone]  Name: Denise [name] MRN: [id number] Date of Birth: [birth year]

## 2018-08-20 ENCOUNTER — Inpatient Hospital Stay: Payer: BC Managed Care – PPO | Attending: Urgent Care | Admitting: Urgent Care

## 2018-08-20 ENCOUNTER — Inpatient Hospital Stay: Payer: BC Managed Care – PPO

## 2018-08-20 ENCOUNTER — Other Ambulatory Visit: Payer: Self-pay | Admitting: Hematology and Oncology

## 2018-08-20 ENCOUNTER — Encounter: Payer: Self-pay | Admitting: Urgent Care

## 2018-08-20 VITALS — BP 134/87 | HR 78 | Temp 97.4°F | Resp 18

## 2018-08-20 VITALS — BP 112/71 | HR 81 | Temp 97.8°F | Resp 18 | Ht 67.0 in | Wt 183.2 lb

## 2018-08-20 DIAGNOSIS — Z170 Estrogen receptor positive status [ER+]: Secondary | ICD-10-CM | POA: Insufficient documentation

## 2018-08-20 DIAGNOSIS — Z923 Personal history of irradiation: Secondary | ICD-10-CM | POA: Diagnosis not present

## 2018-08-20 DIAGNOSIS — Z148 Genetic carrier of other disease: Secondary | ICD-10-CM | POA: Insufficient documentation

## 2018-08-20 DIAGNOSIS — Z5112 Encounter for antineoplastic immunotherapy: Secondary | ICD-10-CM

## 2018-08-20 DIAGNOSIS — C773 Secondary and unspecified malignant neoplasm of axilla and upper limb lymph nodes: Secondary | ICD-10-CM | POA: Diagnosis not present

## 2018-08-20 DIAGNOSIS — H539 Unspecified visual disturbance: Secondary | ICD-10-CM | POA: Insufficient documentation

## 2018-08-20 DIAGNOSIS — R232 Flushing: Secondary | ICD-10-CM

## 2018-08-20 DIAGNOSIS — N951 Menopausal and female climacteric states: Secondary | ICD-10-CM | POA: Insufficient documentation

## 2018-08-20 DIAGNOSIS — C50419 Malignant neoplasm of upper-outer quadrant of unspecified female breast: Secondary | ICD-10-CM

## 2018-08-20 DIAGNOSIS — C50412 Malignant neoplasm of upper-outer quadrant of left female breast: Secondary | ICD-10-CM

## 2018-08-20 DIAGNOSIS — R945 Abnormal results of liver function studies: Secondary | ICD-10-CM

## 2018-08-20 DIAGNOSIS — Z79899 Other long term (current) drug therapy: Secondary | ICD-10-CM | POA: Diagnosis not present

## 2018-08-20 DIAGNOSIS — C50212 Malignant neoplasm of upper-inner quadrant of left female breast: Secondary | ICD-10-CM | POA: Insufficient documentation

## 2018-08-20 DIAGNOSIS — R7989 Other specified abnormal findings of blood chemistry: Secondary | ICD-10-CM

## 2018-08-20 LAB — CBC WITH DIFFERENTIAL/PLATELET
Abs Immature Granulocytes: 0.02 10*3/uL (ref 0.00–0.07)
Basophils Absolute: 0 10*3/uL (ref 0.0–0.1)
Basophils Relative: 0 %
Eosinophils Absolute: 0.1 10*3/uL (ref 0.0–0.5)
Eosinophils Relative: 3 %
HCT: 35.2 % — ABNORMAL LOW (ref 36.0–46.0)
Hemoglobin: 11.7 g/dL — ABNORMAL LOW (ref 12.0–15.0)
Immature Granulocytes: 1 %
Lymphocytes Relative: 23 %
Lymphs Abs: 0.9 10*3/uL (ref 0.7–4.0)
MCH: 28.2 pg (ref 26.0–34.0)
MCHC: 33.2 g/dL (ref 30.0–36.0)
MCV: 84.8 fL (ref 80.0–100.0)
Monocytes Absolute: 0.3 10*3/uL (ref 0.1–1.0)
Monocytes Relative: 6 %
Neutro Abs: 2.8 10*3/uL (ref 1.7–7.7)
Neutrophils Relative %: 67 %
Platelets: 180 10*3/uL (ref 150–400)
RBC: 4.15 MIL/uL (ref 3.87–5.11)
RDW: 13.5 % (ref 11.5–15.5)
WBC: 4.1 10*3/uL (ref 4.0–10.5)
nRBC: 0 % (ref 0.0–0.2)

## 2018-08-20 LAB — COMPREHENSIVE METABOLIC PANEL
ALT: 33 U/L (ref 0–44)
AST: 33 U/L (ref 15–41)
Albumin: 4.1 g/dL (ref 3.5–5.0)
Alkaline Phosphatase: 94 U/L (ref 38–126)
Anion gap: 7 (ref 5–15)
BUN: 10 mg/dL (ref 6–20)
CO2: 26 mmol/L (ref 22–32)
Calcium: 8.9 mg/dL (ref 8.9–10.3)
Chloride: 104 mmol/L (ref 98–111)
Creatinine, Ser: 0.63 mg/dL (ref 0.44–1.00)
GFR calc Af Amer: >60 mL/min (ref >60)
GFR calc non Af Amer: >60 mL/min (ref >60)
Glucose, Bld: 143 mg/dL — ABNORMAL HIGH (ref 70–99)
Potassium: 3.6 mmol/L (ref 3.5–5.1)
Sodium: 137 mmol/L (ref 135–145)
Total Bilirubin: 0.9 mg/dL (ref 0.3–1.2)
Total Protein: 7.2 g/dL (ref 6.5–8.1)

## 2018-08-20 LAB — MAGNESIUM: Magnesium: 1.7 mg/dL (ref 1.7–2.4)

## 2018-08-20 LAB — PREGNANCY, URINE: Preg Test, Ur: NEGATIVE

## 2018-08-20 MED ORDER — SODIUM CHLORIDE 0.9 % IV SOLN
320.0000 mg | Freq: Once | INTRAVENOUS | Status: AC
  Administered 2018-08-20: 320 mg via INTRAVENOUS
  Filled 2018-08-20: qty 16

## 2018-08-20 MED ORDER — HEPARIN SOD (PORK) LOCK FLUSH 100 UNIT/ML IV SOLN
500.0000 [IU] | Freq: Once | INTRAVENOUS | Status: AC
  Administered 2018-08-20: 500 [IU] via INTRAVENOUS
  Filled 2018-08-20: qty 5

## 2018-08-20 MED ORDER — SODIUM CHLORIDE 0.9 % IV SOLN
Freq: Once | INTRAVENOUS | Status: AC
  Administered 2018-08-20: 10:00:00 via INTRAVENOUS
  Filled 2018-08-20: qty 250

## 2018-08-20 MED ORDER — SODIUM CHLORIDE 0.9% FLUSH
10.0000 mL | INTRAVENOUS | Status: DC | PRN
  Administered 2018-08-20: 10 mL via INTRAVENOUS
  Filled 2018-08-20: qty 10

## 2018-08-20 MED ORDER — DIPHENHYDRAMINE HCL 25 MG PO CAPS
50.0000 mg | ORAL_CAPSULE | Freq: Once | ORAL | Status: AC
  Administered 2018-08-20: 50 mg via ORAL
  Filled 2018-08-20: qty 2

## 2018-08-20 MED ORDER — ACETAMINOPHEN 325 MG PO TABS
650.0000 mg | ORAL_TABLET | Freq: Once | ORAL | Status: AC
  Administered 2018-08-20: 650 mg via ORAL
  Filled 2018-08-20: qty 2

## 2018-08-20 NOTE — Progress Notes (Signed)
No new changes noted today 

## 2018-08-20 NOTE — Progress Notes (Signed)
[facility] day:  08/20/18  Chief Complaint: Denise [name] is a 43 y.o. female with multi-focal Her2/neu + left breast cancer who is seen for assessment prior to cycle #3 Kadcyla.  HPI: The patient was last seen in the medical oncology clinic on 08/01/2018.  At that time, patient point of fatigue.  She denied nausea, vomiting, and changes to her bowel habits.  She had some minor cold symptoms that were being treated with OTC interventions.  She complained of progressive blurred vision that began with acute onset on 07/27/2018.  Had plans to be seen by ophthalmology.  Exam was unremarkable.  WBC 5300 ([place] 3800).  ALT mildly elevated at 49 U/L.  Urine pregnancy test was negative.  Patient received breast radiation from 06/26/2018-08/05/2018.  Single journal article able to be located, while patient was in the clinic, that documented incidence of visual changes associated with Kadcyla treatment. Additional steps were taken to ensure patient safety:  1. Consulted medical oncology at [facility] (phone) to determine their experience with this symptom, however no one had experienced this side effect.  2. [facility] chemotherapy PharmD reached out to drug manufacturer for further recommendation, however no additional information on visual side effects was able to be obtained. 3. Further literature search led to evidence that side effect was only temporary, and that it would improve/resolve as drug withdrawn.   The decision was made to postpone cycle #3 pending further evaluation by ophthalmology.  Patient was seen in consult on 08/02/2018 by Dr. George Ina at [facility]. Ophthalmologist has spoken with Dr. Mike Gip directly regarding findings and recommendations. Office notes have been requested for provider review.   She was seen in follow up consult on 08/08/2018 by Dr. Hervey Ard (surgery). Notes reviewed. Patient was doing well following LEFT mastectomy.  Tissue expander in place. Patient with genetic mutation that places her at higher risk for developing colon cancer. Discussed scheduling routine colonoscopy. Mammogram planned for 11/2018, barring surgical intervention prior to  then.   She was seen in follow up consult on 08/13/2018 by Dr. Audelia Hives (plastic surgery). Notes reviewed. Tissue expander in place on the LEFT. Discussed immediate post mastectomy reconstruction on the RIGHT later this year.  Unilateral (RIGHT) screening mammogram was scheduled for 08/21/2018.  Patient was contacted by [facility] clinical staff on 08/16/2018 to discuss plans for reinitiation of chemotherapy.  Patient updated on conversations with academic center [facility]) and drug manufacturer regarding her visual side effects. Patient indicated that she had been doing her own research as well. She was given the option to switch back to Herceptin + Perjeta due to the associated side effects that she had experienced with Kadcyla therapy. Patient indicated that she would "like to be on Kadcyla as long as possible".  Discussed that visual changes could potentially be permanent.  Patient knowingly accepted the risk associated with continuing Kadcyla therapy, and asked to be scheduled for treatment. Discussed with attending oncologist Mike Gip, MD), who agrees to proceed. Patient was asked to ensure that she was seen regularly by her ophthalmologist.  Patient noted that she already had a scheduled eye appointment in 4 weeks for re-evaluation.   In the interim, patient has been doing well.  She denies any acute concerns.  No nausea, vomiting, or changes to her bowel habits.  Patient denies that she has experienced any B symptoms. Previously reported "cold" symptoms resolved with conservative treatment alone. Her energy has been stable, and she continues to work  on a full time basis.   With regards to her vision, she notes that it has improved overall.  Patient states, "everything has gotten  better.  My vision is almost back to baseline". Patient denies palmar-plantar erythrodysesthesia, neuropathy, and cold sensitivity associated with the current treatment regimen.   Patient advises that she maintains an adequate appetite. She is eating well. Weight today is 183 lb 3.2 oz (83.1 kg), which compared to her last visit to the clinic, represents a 1 pound increase.    Patient denies pain in the clinic today.  Past Medical History:  Diagnosis Date  . Abnormal breast biopsy    [facility], [name]  . Family history of breast cancer   . Family history of ovarian cancer   . Fibroadenoma of breast, left 2000, 2016  . Genetic screening 08/26/2013   BRCA/BART negative/Myriad, CHEK2 POS 2017  . Increased risk of breast cancer 2017   IBIS=47%  . Left breast lump 06/18/2017   Fluid/drained [name]  . Migraine   . Monoallelic mutation of CHEK2 gene in female patient 2017   increased risk of breast and colon cancer    Past Surgical History:  Procedure Laterality Date  . BREAST BIOPSY Bilateral 09/08/14   neg  . BREAST BIOPSY Left 03/16/2015   Procedure: BREAST BIOPSY WITH NEEDLE LOCALIZATION;  Surgeon: [name], MD;  Location: [facility] ORS;  Service: General;  Laterality: Left;  . BREAST CYST ASPIRATION Left 06/18/2017   cyst aspiration  . BREAST EXCISIONAL BIOPSY Left 09/2014  . BREAST EXCISIONAL BIOPSY Right    age 49's  . BREAST RECONSTRUCTION WITH PLACEMENT OF TISSUE EXPANDER AND FLEX HD (ACELLULAR HYDRATED DERMIS) Left 05/20/2018   Procedure: BREAST RECONSTRUCTION WITH PLACEMENT OF TISSUE EXPANDER AND FLEX HD (ACELLULAR HYDRATED DERMIS);  Surgeon: [name], DO;  Location: [facility] ORS;  Service: Plastics;  Laterality: Left;  . BREAST SURGERY Right March 2000   benign fibroadenoma  . BREAST SURGERY Left 08/08/13   excision  . BREAST SURGERY Left 09/23/14   excision  . MASTECTOMY W/ SENTINEL NODE BIOPSY Left 05/20/2018   Procedure: MASTECTOMY WITH SENTINEL LYMPH NODE  BIOPSY;  Surgeon: [name], MD;  Location: [facility] ORS;  Service: General;  Laterality: Left;  . PORTACATH PLACEMENT Right 12/17/2017   Procedure: INSERTION PORT-A-CATH;  Surgeon: [name], MD;  Location: [facility] ORS;  Service: General;  Laterality: Right;    Family History  Problem Relation Age of Onset  . Prostate cancer Father 75  . Breast cancer Paternal Grandmother 48  . Breast cancer Maternal Aunt 60  . Ovarian cancer Maternal Aunt 70  . Breast cancer Paternal Aunt 56  . Pancreatic cancer Maternal Aunt 75    Social History:  reports that she has never smoked. She has never used smokeless tobacco. She reports current alcohol use. She reports that she does not use drugs.  She does not smoke. She "occasionally" drinks alcohol. 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 Patient denies known exposures to radiation on toxins. Her husband's name is [name]. She has 2 daughters, [name] and [name] (ages 54 1/2 and 49).  Her husband's name is [name].  The patient is alone today.  Allergies:  Allergies  Allergen Reactions  . Steri-Strip Compound Benzoin [Benzoin Compound]     Steri-strip ,   . Tape Other (See Comments)    Minor skin irritation     Current Medications: Current Outpatient Medications  Medication Sig Dispense  Refill  . acetaminophen (TYLENOL) 500 MG tablet Take 500 mg by mouth every 6 (six) hours as needed for moderate pain or headache.    . Chlorphen-Pseudoephed-APAP (CORICIDIN D PO) Take by mouth.    . diazepam (VALIUM) 2 MG tablet Take 2 mg by mouth every 6 (six) hours as needed for anxiety.    Marland Kitchen HYDROcodone-acetaminophen (NORCO) 5-325 MG tablet Take 1 tablet by mouth every 8 (eight) hours as needed for moderate pain. 20 tablet 0  . magnesium oxide (MAG-OX) 400 MG tablet Take 1 tablet (400 mg total) by mouth daily. 30 tablet 0  . omeprazole (PRILOSEC OTC) 20 MG tablet Take 20 mg by mouth daily as needed (for acid reflux).    . venlafaxine  XR (EFFEXOR-XR) 37.5 MG 24 hr capsule Take 1 capsule (37.5 mg total) by mouth daily with breakfast. 30 capsule 2   No current facility-administered medications for this visit.    Facility-Administered Medications Ordered in Other Visits  Medication Dose Route Frequency Provider Last Rate Last Dose  . sodium chloride flush (NS) 0.9 % injection 10 mL  10 mL Intravenous PRN Lequita Asal, MD   10 mL at 04/17/18 0844    Review of Systems  Constitutional: Negative for diaphoresis, fever, malaise/fatigue and weight loss (up 1 pound).  HENT: Negative.   Eyes: Negative.        Vision "almost" back to baseline  Respiratory: Negative for cough, hemoptysis, sputum production and shortness of breath.   Cardiovascular: Negative for chest pain, palpitations, orthopnea, leg swelling and PND.  Gastrointestinal: Negative for abdominal pain, blood in stool, constipation, diarrhea, melena, nausea and vomiting.  Genitourinary: Negative for dysuria, frequency, hematuria and urgency.  Musculoskeletal: Negative for back pain, falls, joint pain and myalgias.  Skin: Negative for itching and rash.  Neurological: Negative for dizziness, tremors, weakness and headaches.  Endo/Heme/Allergies: Does not bruise/bleed easily.       PMH (+) for diabetes. (+) stable vasomotor symptoms.   Psychiatric/Behavioral: Negative for depression, memory loss and suicidal ideas. The patient is nervous/anxious. The patient does not have insomnia.   All other systems reviewed and are negative.  Performance status (ECOG): 1 - Symptomatic but completely ambulatory  Vital Signs BP 112/71 (BP Location: Right Arm, Patient Position: Sitting)   Pulse 81   Temp 97.8 F (36.6 C) (Tympanic)   Resp 18   Ht 5' 7"  (1.702 m)   Wt 183 lb 3.2 oz (83.1 kg)   SpO2 100%   BMI 28.69 kg/m   Physical Exam  Constitutional: She is oriented to person, place, and time and well-developed, well-nourished, and in no distress.  HENT:  Head:  Normocephalic and atraumatic.  Mouth/Throat: Oropharynx is clear and moist and mucous membranes are normal.  Eyes: Pupils are equal, round, and reactive to light. EOM are normal. No scleral icterus.  Neck: Normal range of motion. Neck supple. No tracheal deviation present. No thyromegaly present.  Cardiovascular: Normal rate, regular rhythm, normal heart sounds and intact distal pulses. Exam reveals no gallop and no friction rub.  No murmur heard. Pulmonary/Chest: Effort normal and breath sounds normal. No respiratory distress. She has no wheezes. She has no rales.  Abdominal: Soft. Bowel sounds are normal. She exhibits no distension. There is no abdominal tenderness.  Musculoskeletal: Normal range of motion.        General: No tenderness or edema.  Lymphadenopathy:    She has no cervical adenopathy.    She has no axillary adenopathy.  Right: No inguinal and no supraclavicular adenopathy present.       Left: No inguinal and no supraclavicular adenopathy present.  Neurological: She is alert and oriented to person, place, and time.  Skin: Skin is warm and dry. No rash noted. No erythema.  Psychiatric: Mood, affect and judgment normal.  Nursing note and vitals reviewed.   No visits with results within 3 Day(s) from this visit.  Latest known visit with results is:  Infusion on 08/01/2018  Component Date Value Ref Range Status  . Magnesium 08/01/2018 1.9  1.7 - 2.4 mg/dL Final   Performed at [facility], [address] [place], [place] [address]  . Sodium 08/01/2018 140  135 - 145 mmol/L Final  . Potassium 08/01/2018 3.7  3.5 - 5.1 mmol/L Final  . Chloride 08/01/2018 103  98 - 111 mmol/L Final  . CO2 08/01/2018 27  22 - 32 mmol/L Final  . Glucose, Bld 08/01/2018 133* 70 - 99 mg/dL Final  . BUN 08/01/2018 12  6 - 20 mg/dL Final  . Creatinine, Ser 08/01/2018 0.64  0.44 - 1.00 mg/dL Final  . Calcium 08/01/2018 9.3  8.9 - 10.3 mg/dL Final  . Total Protein 08/01/2018 7.4   6.5 - 8.1 g/dL Final  . Albumin 08/01/2018 4.1  3.5 - 5.0 g/dL Final  . AST 08/01/2018 40  15 - 41 U/L Final  . ALT 08/01/2018 49* 0 - 44 U/L Final  . Alkaline Phosphatase 08/01/2018 92  38 - 126 U/L Final  . Total Bilirubin 08/01/2018 1.0  0.3 - 1.2 mg/dL Final  . GFR calc non Af Amer 08/01/2018 >60  >60 mL/min Final  . GFR calc Af Amer 08/01/2018 >60  >60 mL/min Final  . Anion gap 08/01/2018 10  5 - 15 Final   Performed at [facility], [address] [place], [place] [address]  . WBC 08/01/2018 5.3  4.0 - 10.5 K/uL Final  . RBC 08/01/2018 4.46  3.87 - 5.11 MIL/uL Final  . Hemoglobin 08/01/2018 12.8  12.0 - 15.0 g/dL Final  . HCT 08/01/2018 38.2  36.0 - 46.0 % Final  . MCV 08/01/2018 85.7  80.0 - 100.0 fL Final  . MCH 08/01/2018 28.7  26.0 - 34.0 pg Final  . MCHC 08/01/2018 33.5  30.0 - 36.0 g/dL Final  . RDW 08/01/2018 12.6  11.5 - 15.5 % Final  . Platelets 08/01/2018 256  150 - 400 K/uL Final  . nRBC 08/01/2018 0.0  0.0 - 0.2 % Final  . Neutrophils Relative % 08/01/2018 70  % Final  . Neutro Abs 08/01/2018 3.8  1.7 - 7.7 K/uL Final  . Lymphocytes Relative 08/01/2018 17  % Final  . Lymphs Abs 08/01/2018 0.9  0.7 - 4.0 K/uL Final  . Monocytes Relative 08/01/2018 8  % Final  . Monocytes Absolute 08/01/2018 0.4  0.1 - 1.0 K/uL Final  . Eosinophils Relative 08/01/2018 3  % Final  . Eosinophils Absolute 08/01/2018 0.1  0.0 - 0.5 K/uL Final  . Basophils Relative 08/01/2018 1  % Final  . Basophils Absolute 08/01/2018 0.0  0.0 - 0.1 K/uL Final  . Immature Granulocytes 08/01/2018 1  % Final  . Abs Immature Granulocytes 08/01/2018 0.03  0.00 - 0.07 K/uL Final   Performed at [facility], [address] [place], [place] [address]  . Preg Test, Ur 08/01/2018 NEGATIVE  NEGATIVE Final   Performed at [facility], [address] [place],  [place] [address]    Assessment:  Denise [name] is a 43 y.o. female with multi-focal stage IB Her2/neu + left  breast cancer s/p neoadjuvant chemotherapy followed by mastectomy with sentinel lymph node biopsy on 05/20/2018.  Pathology revealed no residual carcinoma in the breast.  One of three sentinel lymph nodes were positive.  One lymph node had 2 metastatic foci (2.1 mm and 1 mm).  Pathologic stage revealed ypT0 ypN1a.  Index breast mass and axillary node biopsy on 12/06/2017 revealed grade II invasive ductal carcinoma with calcifications at the 11 o'clock position.  There was metastatic carcinoma in 1 of 1 lymph nodes.  Tumor was ER + (100%), PR + (100%), and Ki67 5%.  Her2/neu was 3+ by IHC (heterogeneous) and FISH +.  Diagnostic left mammogram and ultrasound on 12/06/2017 revealed a 1.7 x 1.3 x 1.3 cm irregular hypoechoic mass with internal calcifications at the 11 o'clock position 2 cm from the nipple with internal calcifications. There was a similar appearing 0.9 x 0.8 x 0.7 cm mass at the 11 o'clock position 4 cm from the nipple (2.4 cm from the index lesion).  There were suspicious, segmental calcifications originating from the index lesion and extending 5 cm posteriorly.  There was a 0.6 cm morphologically abnormal left axillary lymph node.  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 The adjacent and more superiorly located 11 o'clock mass (7 x 12 mm) seen on recent ultrasound, 4 cm from the nipple on ultrasound, was also identified. There were 2 probable satellite lesions (7 mm, medial lesion; posterior and lateral lesion). The total span of malignancy was 3 x 3.1 x 4.5 cm.  There were calcifications extending up to 5 cm posterior to the biopsied malignancy which were highly suspicious on mammography but not appreciated.  There was a mass in the lower outer left breast measured 5 mm, representing a change.  The known metastatic node in the left axilla was identified. A node along the  posterolateral margin of the pectoralis minor (cortex 5 mm) was nonspecific but at least somewhat suspicious.  Chest, abdomen, and pelvic CT on 12/19/2017 revealed a solitary enlarged biopsy-proven metastatic left axillary lymph node.  There were no additional findings suspicious for metastatic disease in the chest, abdomen or pelvis.  There was a nonspecific tiny sclerotic upper right sacral lesion, more likely a benign bone island. Bone scintigraphy correlation versus follow-up CT could be considered.  Bone scan on 01/11/2018 was negative for metastatic pattern. No areas of focal tracer uptake. Area of concern in sacrum on CT likely represents benign bone island.   Myriad genetic testing on 08/26/2013 was negative for BRCA1 and 2. 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  She has a family history significant for breast, ovarian, pancreatic, and prostate cancer.  She received 6 cycles of TCHP chemotherapy (12/21/2017 - 04/17/2018) with Margarette Canada support. Cycle #3 was held due to elevated LFTs on 02/04/2018. She received Herceptin + Perjeta alone (04/30/2018 - 05/30/2018).  She is s/p cycle #2 Kadcyla (06/20/2018 - 07/11/2018). Cycle #3 was held on 08/01/2018 due to visual changes (blurred vision).  She began breast radiation on 06/26/2018  Echocardiograms:  12/20/2017 - EF of 60-65%, 03/27/2018 - EF of 60-65%, and 06/18/2018 - EF of 55-60%.  She has a history of elevated LFTs.  Hepatitis B and C serologies were negative on  02/04/2018.  RUQ ultrasound on 02/06/2018 revealed a small anterior gallbladder wall polyp. There was no evidence of cholelithiasis or cholecystis. CBD measured normal at 2 mm, with no evidence of choledocholithiasis. There was normal direction of blood flow towards the liver noted.  Symptomatically, patient is doing well.  She denies any acute concerns.  Patient has "almost" returned to baseline.  No B symptoms or recent infections.  Eating well; weight  up 1 pound.  Anxious about delaying treatment.  Exam is grossly unremarkable.  WBC 4100 ([place] 2800).  Hemoglobin 11.7, hematocrit 35.2, and platelets 180,000.  Urine pregnancy test negative.   Plan: 1. Labs:  CBC with diff, CMP, Mg, urine pregnancy test. 2. Stage IB multifocal LEFT breast cancer  Doing well clinically.  No breast concerns.  Radiation therapy completed on 08/05/2018.  Review follow-up consults with [name] (surgery) and [name] (plastic surgery).  Discuss plans for immediate breast reconstruction following mastectomy.  Labs reviewed. Blood counts stable and adequate enough for treatment. Will proceed with cycle #3 Kadcyla.  Patient has antiemetics and pain medications at home to use on a PRN basis. 3. Vision changes  Vision is "almost" back to baseline.   Review Kadcyla and potentially irreversible visual changes.  Discuss recommendations from [facility] and ophthalmology.  Benefits vs. risks discussed at length.   Having been fully informed of the risks, patient remains adamant that she would like to continue the Kadcyla treatments for "as long as possible".  Patient encouraged to follow up with ophthalmology regularly.  She has an appointment scheduled in 4 weeks for re-evaluation.  4. Abnormal LFTs  Labs reviewed.  Liver function normal.   Continue routine lab monitoring.  5. Vasomotor symptoms  Stable on prescribed dose of daily venlafaxine. Continue as prescribed.   Medication refill sent in per patient request.  6. RTC in on 09/10/2018 for MD assessment, labs (CBC with diff, CMP, Mg, urine preg), and cycle #4 Richrd Prime, NP  08/20/18, 8:32 AM

## 2018-08-21 ENCOUNTER — Ambulatory Visit
Admission: RE | Admit: 2018-08-21 | Discharge: 2018-08-21 | Disposition: A | Discharge status: Home / Self Care | Payer: BC Managed Care – PPO | Source: Ambulatory Visit | Attending: General Surgery | Admitting: General Surgery

## 2018-08-21 ENCOUNTER — Ambulatory Visit: Payer: BC Managed Care – PPO | Admitting: Physical Therapy

## 2018-08-21 DIAGNOSIS — Z1231 Encounter for screening mammogram for malignant neoplasm of breast: Secondary | ICD-10-CM | POA: Insufficient documentation

## 2018-08-21 HISTORY — DX: Personal history of irradiation: Z92.3

## 2018-08-21 HISTORY — DX: Malignant neoplasm of unspecified site of unspecified female breast: C50.919

## 2018-08-21 HISTORY — DX: Personal history of antineoplastic chemotherapy: Z92.21

## 2018-08-22 ENCOUNTER — Telehealth: Payer: Self-pay

## 2018-08-22 ENCOUNTER — Other Ambulatory Visit: Payer: Self-pay | Admitting: Urgent Care

## 2018-08-22 NOTE — Telephone Encounter (Signed)
-----   Message from [name], MD sent at 08/21/2018  2:31 PM EST ----- Please notify the patient that today's mammogram is fine.

## 2018-08-22 NOTE — Telephone Encounter (Signed)
Notified patient as instructed, patient pleased. She will have a pre op appointment with Dr Eusebio Friendly office.

## 2018-08-22 NOTE — Telephone Encounter (Signed)
Contacted facility and spoke with Barry with the office to request a letter to be faxed over stating recommendations regarding treatment plans for patient since he seen her for possible SE from current treatment.

## 2018-08-26 ENCOUNTER — Ambulatory Visit: Payer: BC Managed Care – PPO | Admitting: Physical Therapy

## 2018-08-28 ENCOUNTER — Ambulatory Visit: Payer: BC Managed Care – PPO | Admitting: Physical Therapy

## 2018-08-28 ENCOUNTER — Other Ambulatory Visit: Payer: Self-pay

## 2018-08-28 ENCOUNTER — Encounter: Payer: Self-pay | Admitting: Physical Therapy

## 2018-08-28 DIAGNOSIS — M25512 Pain in left shoulder: Secondary | ICD-10-CM | POA: Diagnosis not present

## 2018-08-28 NOTE — Therapy (Signed)
[facility] [address] [place], [address] Phone: [phone]   Fax:  [phone]  Physical Therapy Treatment  Patient Details  Name: Denise [name] MRN: [id number] Date of Birth: September 15, 1975 Referring Provider (PT): D[place]   Encounter Date: 08/28/2018  PT End of Session - 08/28/18 1534    Visit Number  11    Number of Visits  17    Date for PT Re-Evaluation  10/09/18    PT Start Time  0315    PT Stop Time  0400    PT Time Calculation (min)  45 min    Activity Tolerance  Patient tolerated treatment well    Behavior During Therapy  [facility] for tasks assessed/performed       Past Medical History:  Diagnosis Date  . Abnormal breast biopsy    PASH, [name]  . Breast cancer ([place]) 11/2017   left breast  . Family history of breast cancer   . Family history of ovarian cancer   . Fibroadenoma of breast, left 2000, 2016  . Genetic screening 08/26/2013   BRCA/BART negative/Myriad, CHEK2 POS 2017  . Increased risk of breast cancer 2017   IBIS=47%  . Left breast lump 06/18/2017   Fluid/drained [name]  . Migraine   . Monoallelic mutation of CHEK2 gene in female patient 2017   increased risk of breast and colon cancer  . Personal history of chemotherapy   . Personal history of radiation therapy     Past Surgical History:  Procedure Laterality Date  . BREAST BIOPSY Bilateral 09/08/14   neg  . BREAST BIOPSY Left 03/16/2015   Procedure: BREAST BIOPSY WITH NEEDLE LOCALIZATION;  Surgeon: [name], MD;  Location: [facility] ORS;  Service: General;  Laterality: Left;  . BREAST BIOPSY Left 11/2017   positive  . BREAST CYST ASPIRATION Left 06/18/2017   cyst aspiration  . BREAST EXCISIONAL BIOPSY Left 09/2014  . BREAST EXCISIONAL BIOPSY Right    age 80's  . BREAST RECONSTRUCTION WITH PLACEMENT OF TISSUE EXPANDER AND FLEX HD (ACELLULAR HYDRATED DERMIS) Left 05/20/2018   Procedure: BREAST  RECONSTRUCTION WITH PLACEMENT OF TISSUE EXPANDER AND FLEX HD (ACELLULAR HYDRATED DERMIS);  Surgeon: [name], DO;  Location: [facility] ORS;  Service: Plastics;  Laterality: Left;  . BREAST SURGERY Right March 2000   benign fibroadenoma  . BREAST SURGERY Left 08/08/13   excision  . BREAST SURGERY Left 09/23/14   excision  . MASTECTOMY Left 05/20/2018  . MASTECTOMY W/ SENTINEL NODE BIOPSY Left 05/20/2018   Procedure: MASTECTOMY WITH SENTINEL LYMPH NODE BIOPSY;  Surgeon: [name], MD;  Location: [facility] OR[facility];  Service: General;  Laterality: Left;  . PORTACATH PLACEMENT Right 12/17/2017   Procedure: INSERTION PORT-A-CATH;  Surgeon: [name], MD;  Location: [facility] OR[facility];  Service: General;  Laterality: Right;    There were no vitals filed for this visit.  Subjective Assessment - 08/28/18 1520    Subjective  Patient reports she had difficulty openning a new jar last weekened and realized it was very difficulty. Reports minimal pain and that she is "starting to feel less tense following infusion on 08/21/18"    Pertinent History  Patient is a 43 year old female with breast cancer history beginning in 2016 with multiple benign lumpectomies. Patient reports first malignant finding this past June 2019 in L breast, with following masectomy with saline expander placed 05/20/18. Patient reports chemo prior with radiation beginning next week  5days/week, for 6 weeks. Patient reports she is also planning to have R masectomy with saline expansion in the future, unscheduled. 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 Patient reports she is a mother of a 34 year old and 37 year old that she reports are very involved in extra-curriculars. Patient reports her ROM in the shoulder is improving, but that  she is having some pain in the inside elbow and into medial forearm with straightening. Patient reports some TTP at incision site, but most all pain at elbow into forearm. Worst pain in  past week 7/10 and best 0/10.  Reports pain is sharp in the medial elbow and forearm; denies numbness or tingling    Limitations  House hold activities;Lifting    How long can you sit comfortably?  unlimited    How long can you stand comfortably?  unlimited    How long can you walk comfortably?  unlimited    Patient Stated Goals  Decrease pain    Pain Onset  1 to 4 weeks ago       Ther-Ex -TRX pull ups in neutral 3x 10 attempting more parallel position each set;good carry over from last sesison  - Horizontal adduction with IR 5# (omega cable) 3x 10 with demo with min cuing initially for proper form and posture with good carry over following - ER at 90/90 with 5# omega cable 3x 8/6/6 with cuing for set up and min curing to maintain proper positioning with good carry over following - Forearm supination/pronation with long lever arm and attached 3# wt 3x 10 - Small body blade side to side 30sec and up and down 30sec 3x each min cuing for posture   Manual - STM withtrigger point releaseto L pec minor/major, and intercostals. Increased time spent at pec musculature as patient has increased tension following expansion injection - Multiple bouts of PROM in all directions with patient demonstrating near full ROM following manual techniques                         PT Education - 08/28/18 1532    Education Details  Exercise form    Person(s) Educated  Patient    Methods  Explanation;Demonstration;Verbal cues    Comprehension  Verbalized understanding;Returned demonstration;Verbal cues required       PT Short Term Goals - 06/21/18 1143      PT SHORT TERM GOAL #1   Title  Pt will be independent with HEP in order to improve strength and motion, and to decrease pain in order to improve pain-free function at home and work.    Time  4    Period  Weeks    Status  New        PT Long Term Goals - 08/19/18 1546      PT LONG TERM GOAL #1   Title  Pt will decrease  worst pain as reported on NPRS by at least 3 points in order to demonstrate clinically significant reduction in pain.    Baseline  08/19/18 3/10    Time  8    Period  Weeks    Status  Achieved      PT LONG TERM GOAL #2   Title  Patient will increase FOTO score to 72 to demonstrate predicted increase in functional mobility to complete ADLs    Baseline  08/19/18 84    Time  8    Period  Weeks  Status  New      PT LONG TERM GOAL #3   Title  Patient will demonstrate all active, painfree shoulder ROM within normal limits in order to complete ADLs    Baseline  08/19/18 Full active shoulder ROM with pain at end range abd and flex    Time  8    Period  Weeks    Status  Revised            Plan - 08/28/18 1541    Clinical Impression Statement  PT continued therex progression for shoulder strengthening, which patient is able to complete with accuracy following PT cuing. Patient continues to report decreased pain with IR/ER wnl following manual techniques.     Rehab Potential  Good    Clinical Impairments Affecting Rehab Potential  (+) age, motivation, social support, (-) current sedentary lifestyle, ongoing cancer treatment, other comorbidities    PT Frequency  2x / week    PT Duration  8 weeks    PT Treatment/Interventions  ADLs/Self Care Home Management;Aquatic Therapy;Electrical Stimulation;Cryotherapy;Moist Heat;Iontophoresis 27m/ml Dexamethasone;Functional mobility training;Therapeutic activities;Therapeutic exercise;Traction;Ultrasound;Neuromuscular re-education;Manual techniques;Patient/family education;Manual lymph drainage;Passive range of motion;Dry needling;Energy conservation;Taping    PT Next Visit Plan  Ease soft tissue restrictions, increase ROM as able    PT Home Exercise Plan  pulleys shoulder abd/flex, sidelying lat stretch, supine ER stretch, scar massage    Consulted and Agree with Plan of Care  Patient       Patient will benefit from skilled therapeutic intervention in  order to improve the following deficits and impairments:  Decreased activity tolerance, Decreased endurance, Decreased range of motion, Decreased skin integrity, Hypomobility, Increased fascial restricitons, Impaired UE functional use, Improper body mechanics, Pain, Postural dysfunction, Impaired flexibility, Decreased scar mobility  Visit Diagnosis: Acute pain of left shoulder     Problem List Patient Active Problem List   Diagnosis Date Noted  . Visual changes 08/20/2018  . Breast asymmetry following reconstructive surgery 08/13/2018  . Hot flashes due to menopause 06/18/2018  . Status post left mastectomy 06/04/2018  . S/P breast reconstruction 05/28/2018  . Vasomotor symptoms due to menopause 04/17/2018  . Elevated LFTs 03/17/2018  . Goals of care, counseling/discussion 03/17/2018  . Hypomagnesemia 03/04/2018  . Diarrhea 01/02/2018  . Encounter for antineoplastic chemotherapy 12/21/2017  . Encounter for antineoplastic immunotherapy 12/21/2017  . 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 Breast mass, right 12/05/2016  . Monoallelic mutation of CHEK2 gene in female patient 06/20/2015  . 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 [place] [address]Phone: [phone]  Fax:  [phone] Name: EARLETHIA BASSOMRN: [id number] of Birth: [birth year]

## 2018-08-29 ENCOUNTER — Other Ambulatory Visit: Payer: Self-pay

## 2018-08-29 ENCOUNTER — Encounter: Payer: Self-pay | Admitting: Urgent Care

## 2018-08-30 ENCOUNTER — Telehealth: Payer: Self-pay

## 2018-08-30 NOTE — Telephone Encounter (Signed)
Call to patient to review surgery information.  Patient's surgery has been scheduled for 09/16/18 at [facility] with [name], Dr [name] will do her surgery part immediately following.   The patient is aware she will be contacted by the [facility] to complete a phone interview on 09/04/18.  The patient is aware to call the office should she have further questions.

## 2018-09-02 ENCOUNTER — Encounter: Payer: BC Managed Care – PPO | Admitting: Plastic Surgery

## 2018-09-03 ENCOUNTER — Encounter: Payer: Self-pay | Admitting: Plastic Surgery

## 2018-09-03 ENCOUNTER — Other Ambulatory Visit: Payer: Self-pay | Admitting: General Surgery

## 2018-09-03 ENCOUNTER — Ambulatory Visit (INDEPENDENT_AMBULATORY_CARE_PROVIDER_SITE_OTHER): Payer: Self-pay | Admitting: Plastic Surgery

## 2018-09-03 ENCOUNTER — Other Ambulatory Visit: Payer: Self-pay

## 2018-09-03 VITALS — BP 127/83 | HR 82 | Temp 98.6°F | Ht 67.0 in | Wt 182.0 lb

## 2018-09-03 DIAGNOSIS — Z9882 Breast implant status: Secondary | ICD-10-CM

## 2018-09-03 DIAGNOSIS — Z9012 Acquired absence of left breast and nipple: Secondary | ICD-10-CM

## 2018-09-03 DIAGNOSIS — C50212 Malignant neoplasm of upper-inner quadrant of left female breast: Secondary | ICD-10-CM

## 2018-09-03 DIAGNOSIS — N651 Disproportion of reconstructed breast: Secondary | ICD-10-CM

## 2018-09-03 DIAGNOSIS — Z170 Estrogen receptor positive status [ER+]: Principal | ICD-10-CM

## 2018-09-03 DIAGNOSIS — Z9889 Other specified postprocedural states: Secondary | ICD-10-CM

## 2018-09-03 MED ORDER — CEPHALEXIN 500 MG PO CAPS: 500.0000 mg | ORAL_CAPSULE | Freq: Four times a day (QID) | ORAL | 0 refills | Status: AC

## 2018-09-03 MED ORDER — DIAZEPAM 2 MG PO TABS: 2.0000 mg | ORAL_TABLET | Freq: Four times a day (QID) | ORAL | 0 refills | Status: DC | PRN

## 2018-09-03 MED ORDER — ONDANSETRON HCL 4 MG PO TABS: 4.0000 mg | ORAL_TABLET | Freq: Three times a day (TID) | ORAL | 0 refills | Status: AC | PRN

## 2018-09-03 NOTE — Progress Notes (Signed)
Patient ID: Denise [name], female    DOB: [birth year], 43 y.o.   MRN: [id number]   Chief Complaint  Patient presents with  . Breast Problem    The patient is a 43 year old female here for a history and physical for right breast surgery.  She is going to have a right mastectomy with immediate reconstruction.  She has already had a left mastectomy with expander placement and currently has 400 cc of saline in the expander.  She had to undergo radiation which finished 1 month ago overall she is doing extremely well and handling this very well.  Her incision is healed on the left.  There is no sign of infection.  Her skin looks extremely good.  She understands that she may need a latissimus for the left breast reconstruction.   Review of Systems  Constitutional: Negative.  Negative for activity change and appetite change.  HENT: Negative.   Eyes: Negative.   Respiratory: Negative.   Cardiovascular: Negative.   Gastrointestinal: Negative.   Endocrine: Negative.   Genitourinary: Negative.   Musculoskeletal: Negative.  Negative for back pain.  Psychiatric/Behavioral: Negative.     Past Medical History:  Diagnosis Date  . Abnormal breast biopsy    PASH, [name]  . Breast cancer ([place]) 11/2017   left breast  . Family history of breast cancer   . Family history of ovarian cancer   . Fibroadenoma of breast, left 2000, 2016  . Genetic screening 08/26/2013   BRCA/BART negative/Myriad, CHEK2 POS 2017  . Increased risk of breast cancer 2017   IBIS=47%  . Left breast lump 06/18/2017   Fluid/drained [name]  . Migraine   . Monoallelic mutation of CHEK2 gene in female patient 2017   increased risk of breast and colon cancer  . Personal history of chemotherapy   . Personal history of radiation therapy     Past Surgical History:  Procedure Laterality Date  . BREAST BIOPSY Bilateral 09/08/14   neg  . BREAST BIOPSY Left 03/16/2015   Procedure: BREAST BIOPSY WITH NEEDLE  LOCALIZATION;  Surgeon: [name], MD;  Location: [facility];  Service: General;  Laterality: Left;  . BREAST BIOPSY Left 11/2017   positive  . BREAST CYST ASPIRATION Left 06/18/2017   cyst aspiration  . BREAST EXCISIONAL BIOPSY Left 09/2014  . BREAST EXCISIONAL BIOPSY Right    age 77's  . BREAST RECONSTRUCTION WITH PLACEMENT OF TISSUE EXPANDER AND FLEX HD (ACELLULAR HYDRATED DERMIS) Left 05/20/2018   Procedure: BREAST RECONSTRUCTION WITH PLACEMENT OF TISSUE EXPANDER AND FLEX HD (ACELLULAR HYDRATED DERMIS);  Surgeon: [name], DO;  Location: [facility];  Service: Plastics;  Laterality: Left;  . BREAST SURGERY Right March 2000   benign fibroadenoma  . BREAST SURGERY Left 08/08/13   excision  . BREAST SURGERY Left 09/23/14   excision  . MASTECTOMY Left 05/20/2018  . MASTECTOMY W/ SENTINEL NODE BIOPSY Left 05/20/2018   Procedure: MASTECTOMY WITH SENTINEL LYMPH NODE BIOPSY;  Surgeon: [name], MD;  Location: [facility];  Service: General;  Laterality: Left;  . PORTACATH PLACEMENT Right 12/17/2017   Procedure: INSERTION PORT-A-CATH;  Surgeon: [name], MD;  Location: [facility] OR[facility];  Service: General;  Laterality: Right;      Current Outpatient Medications:  .  acetaminophen (TYLENOL) 500 MG tablet, Take 500 mg by mouth every 6 (six) hours as needed for moderate pain or headache., Disp: , Rfl:  .  diazepam (VALIUM) 2 MG tablet, Take  2 mg by mouth every 6 (six) hours as needed for anxiety., Disp: , Rfl:  .  HYDROcodone-acetaminophen (NORCO) 5-325 MG tablet, Take 1 tablet by mouth every 8 (eight) hours as needed for moderate pain., Disp: 20 tablet, Rfl: 0 .  lidocaine-prilocaine (EMLA) cream, Apply 1 application topically as needed (port access)., Disp: , Rfl:  .  magnesium oxide (MAG-OX) 400 MG tablet, Take 1 tablet (400 mg total) by mouth daily. (Patient taking differently: Take 400 mg by mouth 2 (two) times a week. ), Disp: 30 tablet, Rfl: 0 .  omeprazole (PRILOSEC OTC)  20 MG tablet, Take 20 mg by mouth daily as needed (for acid reflux)., Disp: , Rfl:  .  venlafaxine XR (EFFEXOR-XR) 37.5 MG 24 hr capsule, TAKE 1 CAPSULE BY MOUTH ONCE DAILY WITH BREAKFAST (Patient taking differently: Take 37.5 mg by mouth daily with breakfast. ), Disp: 90 capsule, Rfl: 0 .  vitamin C (ASCORBIC ACID) 500 MG tablet, Take 500 mg by mouth every other day., Disp: , Rfl:  No current facility-administered medications for this visit.   Facility-Administered Medications Ordered in Other Visits:  .  sodium chloride flush (NS) 0.9 % injection 10 mL, 10 mL, Intravenous, PRN, Mike Gip, Melissa C, MD, 10 mL at 04/17/18 0844   Objective:   Vitals:   09/03/18 1024  BP: 127/83  Pulse: 82  Temp: 98.6 F (37 C)  SpO2: 100%    Physical Exam Vitals signs and nursing note reviewed.  Constitutional:      Appearance: Normal appearance.  HENT:     Head: Normocephalic and atraumatic.     Right Ear: External ear normal.     Left Ear: External ear normal.     Nose: Nose normal.     Mouth/Throat:     Mouth: Mucous membranes are moist.  Eyes:     Extraocular Movements: Extraocular movements intact.  Neck:     Musculoskeletal: Normal range of motion.  Cardiovascular:     Rate and Rhythm: Normal rate.  Pulmonary:     Effort: Pulmonary effort is normal.  Abdominal:     General: Abdomen is flat. There is no distension.  Skin:    General: Skin is warm.  Neurological:     General: No focal deficit present.     Mental Status: She is alert.  Psychiatric:        Mood and Affect: Mood normal.        Behavior: Behavior normal.        Thought Content: Thought content normal.        Judgment: Judgment normal.     Assessment & Plan:  Breast asymmetry following reconstructive surgery  Status post left mastectomy  S/P breast reconstruction  Acquired absence of left breast  Plan on immediate right breast reconstruction with expander and Flex HD placement. Prescription sent to  pharmacy. We placed injectable saline in the Expander using a sterile technique: Left: 50 cc for a total of 450 / 375 cc  Gregory, DO

## 2018-09-04 ENCOUNTER — Other Ambulatory Visit: Payer: Self-pay

## 2018-09-04 ENCOUNTER — Encounter
Admission: RE | Admit: 2018-09-04 | Discharge: 2018-09-04 | Disposition: A | Discharge status: Home / Self Care | Payer: BC Managed Care – PPO | Source: Ambulatory Visit | Attending: General Surgery | Admitting: General Surgery

## 2018-09-04 NOTE — Pre-Procedure Instructions (Signed)
Denise [name]  ECHO COMPLETE WO IMAGING ENHANCING AGENT  Order# [id number]  Reading physician: Corey Skains, MD Ordering physician: Lequita Asal, MD Study date: 06/18/18  ECHOCARDIOGRAM COMPLETE (Accession [id number]) (Order [id number])  Echocardiography  Date: 06/18/2018 Department: [place] Released By: Teryl Lucy Authorizing: Lequita Asal, MD  Linked Results   Procedure Abnormality Status  ECHOCARDIOGRAM COMPLETE    ECHOCARDIOGRAM COMPLETE  Order #: [id number] Accession #: [id number]  Patient Info   Patient name: Denise [name]  MRN: [id number]  Age: 43 y.o.  Sex: female  Vitals   BP Height Weight BSA (Calculated - sq m)  132/88 5\' 7"  (1.702 m) 83.5 kg 1.99 sq meters  Study Result   Result status: Final result                   *[facility]*                       [address] [place], [place] [address]                            [phone]  ------------------------------------------------------------------- Transthoracic Echocardiography  Patient:    Denise, [name] MR #:       [id number] Study Date: 06/18/2018 Gender:     F Age:        42 Height:     170.2 cm Weight:     82.1 kg BSA:        1.99 m^2 Pt. 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  ------------------------------------------------------------------- History:   PMH:  Breast  cancer.  ------------------------------------------------------------------- Study Conclusions  - Left ventricle: The cavity size was normal. Systolic function was   normal. The estimated ejection fraction was in the range of 55%   to 60%.  ------------------------------------------------------------------- Study data:   Study status:  Routine.  Procedure:  The patient reported no pain pre or post test. Transthoracic echocardiography for left ventricular function evaluation, for right ventricular function evaluation, and for assessment of valvular function. Image quality was adequate.  Study completion:  There were no complications.          Transthoracic echocardiography.  M-mode, complete 2D, spectral Doppler, and color Doppler.  Birthdate: Patient birthdate: 1975-09-07.  Age:  Patient is 43 yr old.  Sex: Gender: female.    BMI: 28.3 kg/m^2.  Blood pressure:     134/94 Patient status:  Outpatient.  Study date:  Study date: 06/18/2018. Study time: 10:12 AM.  Location:  Echo laboratory.  -------------------------------------------------------------------  ------------------------------------------------------------------- Left ventricle:  The cavity size was normal. Systolic function was normal. The estimated ejection fraction was in the range of 55% to 60%.  ------------------------------------------------------------------- Aortic valve:   Structurally normal valve.   Cusp separation was normal.  Doppler:  Transvalvular velocity was within the normal range. There was no stenosis. There was no regurgitation.  VTI ratio of LVOT to aortic valve: 0.72. Valve area (VTI): 2.06 cm^2. Indexed valve area (VTI): 1.04 cm^2/m^2. Peak velocity ratio of LVOT to aortic valve: 0.87. Valve area (Vmax): 2.46 cm^2. Indexed valve area (Vmax): 1.24 cm^2/m^2. Mean velocity ratio of LVOT to aortic valve: 0.79. Valve area (Vmean): 2.25 cm^2. Indexed valve area (Vmean): 1.13 cm^2/m^2.    Mean  gradient (S): 4 mm Hg. Peak gradient (S): 8 mm Hg.  ------------------------------------------------------------------- Aorta:  The aorta was normal, not dilated, and non-diseased.  ------------------------------------------------------------------- Mitral valve:   Structurally normal valve.   Leaflet separation was normal.  Doppler:  Transvalvular velocity was within the normal range. There was no evidence for stenosis. There was trivial regurgitation.    Valve area by pressure half-time: 5.79 cm^2. Indexed valve area by pressure half-time: 2.91 cm^2/m^2. Valve area by continuity equation (using LVOT flow): 3.73 cm^2. Indexed valve area by continuity equation (using LVOT flow): 1.87 cm^2/m^2. Mean gradient (D): 2 mm Hg. Peak gradient (D): 3 mm Hg.  ------------------------------------------------------------------- Left atrium:  The atrium was normal in size.  ------------------------------------------------------------------- Right ventricle:  The cavity size was normal. Wall thickness was normal. Systolic function was normal.  ------------------------------------------------------------------- Tricuspid valve:   Structurally normal valve.   Leaflet separation was normal.  Doppler:  Transvalvular velocity was within the normal range. There was no regurgitation.  ------------------------------------------------------------------- Right atrium:  The atrium was normal in size.   ------------------------------------------------------------------- Post procedure conclusions Ascending Aorta:  - The aorta was normal, not dilated, and non-diseased.  ------------------------------------------------------------------- Measurements   Left ventricle                           Value          Reference  LV ID, ED, PLAX chordal          (L)     42.3  mm       43 - 52  LV ID, ES, PLAX chordal                  26.9  mm       23 - 38  LV fx shortening, PLAX chordal           36     %        >=29  LV PW thickness, ED                      7.16  mm       ----------  IVS/LV PW ratio, ED                      0.95           <=1.3  Stroke volume, 2D                        60    ml       ----------  Stroke volume/bsa, 2D                    30    ml/m^2   ----------  LV e&', medial                            7.51  cm/s     ----------  LV E/e&', medial  11.69          ----------    Ventricular septum                       Value          Reference  IVS thickness, ED                        6.8   mm       ----------    LVOT                                     Value          Reference  LVOT ID, S                               19    mm       ----------  LVOT area                                2.84  cm^2     ----------  LVOT ID                                  19    mm       ----------  LVOT peak velocity, S                    122   cm/s     ----------  LVOT mean velocity, S                    75.1  cm/s     ----------  LVOT VTI, S                              21    cm       ----------  LVOT peak gradient, S                    6     mm Hg    ----------  Stroke volume (SV), LVOT DP              59.5  ml       ----------  Stroke index (SV/bsa), LVOT DP           29.9  ml/m^2   ----------    Aortic valve                             Value          Reference  Aortic valve peak velocity, S            141   cm/s     ----------  Aortic valve mean velocity, S            94.7  cm/s     ----------  Aortic valve VTI, S                      29    cm       ----------  Aortic mean gradient, S                  4     mm Hg    ----------  Aortic peak gradient, S                  8     mm Hg    ----------  VTI ratio, LVOT/AV                       0.72           ----------  Aortic valve area, VTI                   2.06  cm^2     ----------  Aortic valve area/bsa, VTI               1.04  cm^2/m^2 ----------  Velocity ratio, peak, LVOT/AV            0.87            ----------  Aortic valve area, peak velocity         2.46  cm^2     ----------  Aortic valve area/bsa, peak              1.24  cm^2/m^2 ----------  velocity  Velocity ratio, mean, LVOT/AV            0.79           ----------  Aortic valve area, mean velocity         2.25  cm^2     ----------  Aortic valve area/bsa, mean              1.13  cm^2/m^2 ----------  velocity    Aorta                                    Value          Reference  Aortic root ID, ED                       27    mm       ----------    Left atrium                              Value          Reference  LA ID, A-P, ES                           28    mm       ----------  LA ID/bsa, A-P                           1.41  cm/m^2   <=2.2  LA volume, S                             26.3  ml       ----------  LA volume/bsa, S                         13.2  ml/m^2   ----------  LA volume, ES, 1-p  A4C                   25.3  ml       ----------  LA volume/bsa, ES, 1-p A4C               12.7  ml/m^2   ----------  LA volume, ES, 1-p A2C                   25.7  ml       ----------  LA volume/bsa, ES, 1-p A2C               12.9  ml/m^2   ----------    Mitral valve                             Value          Reference  Mitral E-wave peak velocity              87.8  cm/s     ----------  Mitral A-wave peak velocity              70.3  cm/s     ----------  Mitral mean velocity, D                  69    cm/s     ----------  Mitral deceleration time                 203   ms       150 - 230  Mitral pressure half-time                38    ms       ----------  Mitral mean gradient, D                  2     mm Hg    ----------  Mitral peak gradient, D                  3     mm Hg    ----------  Mitral E/A ratio, peak                   1.2            ----------  Mitral valve area, PHT, DP               5.79  cm^2     ----------  Mitral valve area/bsa, PHT, DP           2.91  cm^2/m^2 ----------  Mitral valve area, LVOT                  3.73  cm^2      ----------  continuity  Mitral valve area/bsa, LVOT              1.87  cm^2/m^2 ----------  continuity  Mitral annulus VTI, D                    16    cm       ----------    Right atrium                             Value          Reference  RA ID, S-I, ES, A4C  40.7  mm       34 - 49  RA area, ES, A4C                         11.2  cm^2     8.3 - 19.5  RA volume, ES, A/L                       24.4  ml       ----------  RA volume/bsa, ES, A/L                   12.3  ml/m^2   ----------    Right ventricle                          Value          Reference  RV ID, minor axis, ED, A4C base          39.8  mm       ----------  TAPSE                                    24.3  mm       ----------    Pulmonic valve                           Value          Reference  Pulmonic valve peak velocity, S          91.6  cm/s     ----------  Pulmonic acceleration time               116   ms       ----------  Legend: (L)  and  (H)  mark values outside specified reference range.  ------------------------------------------------------------------- Prepared and Electronically Authenticated by  Serafina Royals, MD 2020-01-01T06:02:21  [employer] Images   Show images for ECHOCARDIOGRAM COMPLETE  MERGE Images   Show images for ECHOCARDIOGRAM COMPLETE  Performing Technologist/Nurse   Performing Technologist/Nurse: Deland Pretty  Reason for Exam  Priority: Routine  Dx: Malignant neoplasm of upper-outer quadrant of left breast in female, estrogen receptor positive ([place]) [C50.412, Z17.0 (ICD-10-CM)]  Comments:   Patient Data   Height 67 in    BP 134/94 mmHg                            Surgical History   Surgical History   No past medical history on file.    Other Surgical History   Procedure Laterality Date Comment Source  BREAST BIOPSY Bilateral 09/08/14 neg Provider  BREAST BIOPSY Left 03/16/2015 Procedure: BREAST BIOPSY WITH NEEDLE LOCALIZATION; Surgeon:  [name], MD; Location: [facility] ORS; Service: General; Laterality: Left; Provider  BREAST BIOPSY Left 11/2017 positive Provider  BREAST CYST ASPIRATION Left 06/18/2017 cyst aspiration Provider  BREAST EXCISIONAL BIOPSY Left 09/2014  Provider  BREAST EXCISIONAL BIOPSY Right  age 73's Provider  BREAST RECONSTRUCTION WITH PLACEMENT OF TISSUE EXPANDER AND FLEX HD (ACELLULAR HYDRATED DERMIS) Left 05/20/2018 Procedure: BREAST RECONSTRUCTION WITH PLACEMENT OF TISSUE EXPANDER AND FLEX HD (ACELLULAR HYDRATED DERMIS); Surgeon: [name], DO; Location: [facility] ORS; Service: Plastics; Laterality: Left; Provider  BREAST SURGERY Right March 2000 benign fibroadenoma Provider  BREAST SURGERY Left  08/08/13 excision Provider  BREAST SURGERY Left 09/23/14 excision Provider  MASTECTOMY Left 05/20/2018  Provider  MASTECTOMY W/ SENTINEL NODE BIOPSY Left 05/20/2018 Procedure: MASTECTOMY WITH SENTINEL LYMPH NODE BIOPSY; Surgeon: [name], MD; Location: [facility] ORS; Service: General; Laterality: Left; Provider  PORTACATH PLACEMENT Right 12/17/2017 Procedure: INSERTION PORT-A-CATH; Surgeon: [name], MD; Location: [facility] ORS; Service: General; Laterality: Right; Provider    Implants    No active implants to display in this view.  Order-Level Documents:   There are no order-level documents.  Encounter-Level Documents - 06/18/2018:   Electronic signature on 06/18/2018 10:03 AM - Signed      Resulted by:   Signed Date/Time  Phone Pager  Corey Skains 06/19/2018 6:02 AM [phone]   External Result Report   External Result Report

## 2018-09-04 NOTE — Patient Instructions (Signed)
Your procedure is scheduled on: 09-16-18 Report to Same Day Surgery 2nd floor medical mall [facility] Entrance-take elevator on left to 2nd floor.  Check in with surgery information desk.) To find out your arrival time please call [phone] between 1PM - 3PM on 09-12-28  Remember: Instructions that are not followed completely may result in serious medical risk, up to and including death, or upon the discretion of your surgeon and anesthesiologist your surgery may need to be rescheduled.    _x___ 1. Do not eat food after midnight the night before your procedure. You may drink clear liquids up to 2 hours before you are scheduled to arrive at the hospital for your procedure.  Do not drink clear liquids within 2 hours of your scheduled arrival to the hospital.  Clear liquids include  --Water or Apple juice without pulp  --Clear carbohydrate beverage such as ClearFast or Gatorade  --Black Coffee or Clear Tea (No milk, no creamers, do not add anything to the coffee or Tea   ____Ensure clear carbohydrate drink on the way to the hospital for bariatric patients  ____Ensure clear carbohydrate drink 3 hours before surgery for Dr Dwyane Luo patients if physician instructed.   No gum chewing or hard candies.     __x__ 2. No Alcohol for 24 hours before or after surgery.   __x__3. No Smoking or e-cigarettes for 24 prior to surgery.  Do not use any chewable tobacco products for at least 6 hour prior to surgery   ____  4. Bring all medications with you on the day of surgery if instructed.    __x__ 5. Notify your doctor if there is any change in your medical condition     (cold, fever, infections).    x___6. On the morning of surgery brush your teeth with toothpaste and water.  You may rinse your mouth with mouth wash if you wish.  Do not swallow any toothpaste or mouthwash.   Do not wear jewelry, make-up, hairpins, clips or nail polish.  Do not wear lotions, powders, or perfumes. You may wear  deodorant.  Do not shave 48 hours prior to surgery. Men may shave face and neck.  Do not bring valuables to the hospital.    [facility] is not responsible for any belongings or valuables.               Contacts, dentures or bridgework may not be worn into surgery.  Leave your suitcase in the car. After surgery it may be brought to your room.  For patients admitted to the hospital, discharge time is determined by your treatment team.  _  Patients discharged the day of surgery will not be allowed to drive home.  You will need someone to drive you home and stay with you the night of your procedure.    Please read over the following fact sheets that you were given:   [facility] Preparing for Surgery    _x___ TAKE THE FOLLOWING MEDICATION THE MORNING OF SURGERY WITH A SMALL SIP OF WATER. These include:  1. EFFEXOR  2.PRILOSEC  3.TAKE A PRILOSEC THE NIGHT BEFORE YOUR SURGERY  4.  5.  6.  ____Fleets enema or Magnesium Citrate as directed.   ____ Use CHG Soap or sage wipes as directed on instruction sheet   ____ Use inhalers on the day of surgery and bring to hospital day of surgery  ____ Stop Metformin and Janumet 2 days prior to surgery.    ____ Take 1/2  of usual insulin dose the night before surgery and none on the morning surgery.   ____ Follow recommendations from Cardiologist, Pulmonologist or PCP regarding stopping Aspirin, Coumadin, Plavix ,Eliquis, Effient, or Pradaxa, and Pletal.  X____Stop Anti-inflammatories such as Advil, Aleve, Ibuprofen, Motrin, Naproxen, Naprosyn, Goodies powders or aspirin products 7 DAYS PRIOR TO SURGERY-OK to take Tylenol    ____ Stop supplements until after surgery.     ____ Bring C-Pap to the hospital.

## 2018-09-05 ENCOUNTER — Telehealth: Payer: Self-pay

## 2018-09-05 NOTE — Telephone Encounter (Signed)
The patient is scheduled for surgery with Dr Bary Castilla at [facility] on 09/16/18. Patient contacted today and due to recent COVID-19 restrictions, elective surgeries will need to be postponed.   The patient will be contacted once restrictions have been lifted regarding rescheduling.   Patient verbalizes understanding.

## 2018-09-10 ENCOUNTER — Inpatient Hospital Stay: Payer: BC Managed Care – PPO

## 2018-09-10 ENCOUNTER — Other Ambulatory Visit: Payer: Self-pay

## 2018-09-10 ENCOUNTER — Encounter: Payer: Self-pay | Admitting: Hematology and Oncology

## 2018-09-10 ENCOUNTER — Inpatient Hospital Stay (HOSPITAL_BASED_OUTPATIENT_CLINIC_OR_DEPARTMENT_OTHER): Payer: BC Managed Care – PPO | Admitting: Hematology and Oncology

## 2018-09-10 VITALS — BP 117/68 | HR 68 | Temp 97.1°F | Resp 18

## 2018-09-10 DIAGNOSIS — Z170 Estrogen receptor positive status [ER+]: Secondary | ICD-10-CM

## 2018-09-10 DIAGNOSIS — C773 Secondary and unspecified malignant neoplasm of axilla and upper limb lymph nodes: Secondary | ICD-10-CM | POA: Diagnosis not present

## 2018-09-10 DIAGNOSIS — N951 Menopausal and female climacteric states: Secondary | ICD-10-CM | POA: Diagnosis not present

## 2018-09-10 DIAGNOSIS — C50419 Malignant neoplasm of upper-outer quadrant of unspecified female breast: Secondary | ICD-10-CM

## 2018-09-10 DIAGNOSIS — C50212 Malignant neoplasm of upper-inner quadrant of left female breast: Secondary | ICD-10-CM | POA: Diagnosis not present

## 2018-09-10 DIAGNOSIS — H539 Unspecified visual disturbance: Secondary | ICD-10-CM

## 2018-09-10 DIAGNOSIS — Z148 Genetic carrier of other disease: Secondary | ICD-10-CM

## 2018-09-10 DIAGNOSIS — Z923 Personal history of irradiation: Secondary | ICD-10-CM

## 2018-09-10 DIAGNOSIS — Z5112 Encounter for antineoplastic immunotherapy: Secondary | ICD-10-CM

## 2018-09-10 DIAGNOSIS — C50412 Malignant neoplasm of upper-outer quadrant of left female breast: Secondary | ICD-10-CM

## 2018-09-10 LAB — PREGNANCY, URINE: Preg Test, Ur: NEGATIVE

## 2018-09-10 LAB — CBC WITH DIFFERENTIAL/PLATELET
Abs Immature Granulocytes: 0.02 10*3/uL (ref 0.00–0.07)
Basophils Absolute: 0 10*3/uL (ref 0.0–0.1)
Basophils Relative: 1 %
Eosinophils Absolute: 0.1 10*3/uL (ref 0.0–0.5)
Eosinophils Relative: 3 %
HCT: 36.9 % (ref 36.0–46.0)
Hemoglobin: 12.5 g/dL (ref 12.0–15.0)
Immature Granulocytes: 0 %
Lymphocytes Relative: 25 %
Lymphs Abs: 1.1 10*3/uL (ref 0.7–4.0)
MCH: 28.5 pg (ref 26.0–34.0)
MCHC: 33.9 g/dL (ref 30.0–36.0)
MCV: 84.1 fL (ref 80.0–100.0)
Monocytes Absolute: 0.4 10*3/uL (ref 0.1–1.0)
Monocytes Relative: 9 %
Neutro Abs: 2.8 10*3/uL (ref 1.7–7.7)
Neutrophils Relative %: 62 %
Platelets: 223 10*3/uL (ref 150–400)
RBC: 4.39 MIL/uL (ref 3.87–5.11)
RDW: 14.3 % (ref 11.5–15.5)
WBC: 4.5 10*3/uL (ref 4.0–10.5)
nRBC: 0 % (ref 0.0–0.2)

## 2018-09-10 LAB — COMPREHENSIVE METABOLIC PANEL
ALT: 33 U/L (ref 0–44)
AST: 33 U/L (ref 15–41)
Albumin: 4 g/dL (ref 3.5–5.0)
Alkaline Phosphatase: 102 U/L (ref 38–126)
Anion gap: 6 (ref 5–15)
BUN: 12 mg/dL (ref 6–20)
CO2: 26 mmol/L (ref 22–32)
Calcium: 9.1 mg/dL (ref 8.9–10.3)
Chloride: 105 mmol/L (ref 98–111)
Creatinine, Ser: 0.63 mg/dL (ref 0.44–1.00)
GFR calc Af Amer: >60 mL/min (ref >60)
GFR calc non Af Amer: >60 mL/min (ref >60)
Glucose, Bld: 110 mg/dL — ABNORMAL HIGH (ref 70–99)
Potassium: 3.7 mmol/L (ref 3.5–5.1)
Sodium: 137 mmol/L (ref 135–145)
Total Bilirubin: 0.8 mg/dL (ref 0.3–1.2)
Total Protein: 7.4 g/dL (ref 6.5–8.1)

## 2018-09-10 LAB — MAGNESIUM: Magnesium: 1.8 mg/dL (ref 1.7–2.4)

## 2018-09-10 MED ORDER — SODIUM CHLORIDE 0.9% FLUSH
10.0000 mL | INTRAVENOUS | Status: DC | PRN
  Administered 2018-09-10: 10 mL via INTRAVENOUS
  Filled 2018-09-10: qty 10

## 2018-09-10 MED ORDER — DIPHENHYDRAMINE HCL 25 MG PO CAPS
50.0000 mg | ORAL_CAPSULE | Freq: Once | ORAL | Status: AC
  Administered 2018-09-10: 50 mg via ORAL

## 2018-09-10 MED ORDER — SODIUM CHLORIDE 0.9 % IV SOLN
320.0000 mg | Freq: Once | INTRAVENOUS | Status: AC
  Administered 2018-09-10: 320 mg via INTRAVENOUS
  Filled 2018-09-10: qty 10

## 2018-09-10 MED ORDER — HEPARIN SOD (PORK) LOCK FLUSH 100 UNIT/ML IV SOLN
500.0000 [IU] | Freq: Once | INTRAVENOUS | Status: AC
  Administered 2018-09-10: 500 [IU] via INTRAVENOUS

## 2018-09-10 MED ORDER — ACETAMINOPHEN 325 MG PO TABS
650.0000 mg | ORAL_TABLET | Freq: Once | ORAL | Status: AC
  Administered 2018-09-10: 650 mg via ORAL

## 2018-09-10 MED ORDER — SODIUM CHLORIDE 0.9 % IV SOLN
Freq: Once | INTRAVENOUS | Status: AC
  Administered 2018-09-10: 12:00:00 via INTRAVENOUS
  Filled 2018-09-10: qty 250

## 2018-09-10 NOTE — Progress Notes (Addendum)
[facility]     [address], [address]     [place], [place] [address]     Phone: [phone]      Fax: [phone]        Clinic day:  09/10/18  Chief Complaint: Denise [name] is a 43 y.o. female with multi-focal Her2/neu + left breast cancer who is seen for assessment prior to cycle #4 Kadcyla.  HPI: The patient was last seen in the medical oncology clinic on 08/20/2018.  At that time, she was doing well.  She denied any acute concerns.  Vision had "almost" returned to baseline.  No B symptoms or recent infections.  Eating well; weight up 1 pound.  Anxious about delaying treatment.  Exam was grossly unremarkable.  WBC was 4100 ([place] 2800).  Hemoglobin 11.7, hematocrit 35.2, and platelets 180,000.  Urine pregnancy test was negative.  She received cycle #3 Kadcyla.  During the interim, she has done well.  She denies any vision changes.  She denies any nausea, vomiting or diarrhea.   Past Medical History:  Diagnosis Date  . Abnormal breast biopsy    [facility], [name]  . Breast cancer ([place]) 11/2017   left breast  . Family history of breast cancer   . Family history of ovarian cancer   . Fibroadenoma of breast, left 2000, 2016  . Genetic screening 08/26/2013   BRCA/BART negative/[facility]riad, CHEK2 POS 2017  . Increased risk of breast cancer 2017   IBIS=47%  . Left breast lump 06/18/2017   Fluid/drained [name]  . Migraine   . Monoallelic mutation of CHEK2 gene in female patient 2017   increased risk of breast and colon cancer  . Personal history of chemotherapy   . Personal history of radiation therapy     Past Surgical History:  Procedure Laterality Date  . BREAST BIOPSY Bilateral 09/08/14   neg  . BREAST BIOPSY Left 03/16/2015   Procedure: BREAST BIOPSY WITH NEEDLE LOCALIZATION;  Surgeon: [name], MD;  Location: [facility] ORS;  Service: General;  Laterality: Left;  . BREAST BIOPSY Left 11/2017   positive  . BREAST CYST ASPIRATION Left  06/18/2017   cyst aspiration  . BREAST EXCISIONAL BIOPSY Left 09/2014  . BREAST EXCISIONAL BIOPSY Right    age 39's  . BREAST RECONSTRUCTION WITH PLACEMENT OF TISSUE EXPANDER AND FLEX HD (ACELLULAR HYDRATED DERMIS) Left 05/20/2018   Procedure: BREAST RECONSTRUCTION WITH PLACEMENT OF TISSUE EXPANDER AND FLEX HD (ACELLULAR HYDRATED DERMIS);  Surgeon: [name], DO;  Location: [facility] ORS;  Service: Plastics;  Laterality: Left;  . BREAST SURGERY Right March 2000   benign fibroadenoma  . BREAST SURGERY Left 08/08/13   excision  . BREAST SURGERY Left 09/23/14   excision  . MASTECTOMY Left 05/20/2018  . MASTECTOMY W/ SENTINEL NODE BIOPSY Left 05/20/2018   Procedure: MASTECTOMY WITH SENTINEL LYMPH NODE BIOPSY;  Surgeon: [name], MD;  Location: [facility] ORS;  Service: General;  Laterality: Left;  . PORTACATH PLACEMENT Right 12/17/2017   Procedure: INSERTION PORT-A-CATH;  Surgeon: [name], MD;  Location: [facility] ORS;  Service: General;  Laterality: Right;    Family History  Problem Relation Age of Onset  . Prostate cancer Father 17  . Breast cancer Paternal Grandmother 81  . Breast cancer Maternal Aunt 60  . Ovarian cancer Maternal Aunt 70  . Breast cancer Paternal Aunt 71  . Pancreatic cancer Maternal Aunt 75    Social History:  reports that  she has never smoked. She has never used smokeless tobacco. She reports current alcohol use. She reports that she does not use drugs.  She does not smoke. She "occasionally" drinks alcohol. 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 Patient denies known exposures to radiation on toxins. Her husband's name is [name]. She has 2 daughters, [name] and [name] (ages 73 1/2 and 65).  Her husband's name is [name].  The patient is alone today.  Allergies:  Allergies  Allergen Reactions  . Steri-Strip Compound Benzoin [Benzoin Compound]     Steri-strip ,   . Tape Other (See Comments)    Minor skin irritation     Current  Medications: Current Outpatient Medications  Medication Sig Dispense Refill  . magnesium oxide (MAG-OX) 400 MG tablet Take 1 tablet (400 mg total) by mouth daily. (Patient taking differently: Take 400 mg by mouth 2 (two) times a week. ) 30 tablet 0  . omeprazole (PRILOSEC OTC) 20 MG tablet Take 20 mg by mouth daily as needed (for acid reflux).    . venlafaxine XR (EFFEXOR-XR) 37.5 MG 24 hr capsule TAKE 1 CAPSULE BY MOUTH ONCE DAILY WITH BREAKFAST (Patient taking differently: Take 37.5 mg by mouth daily with breakfast. ) 90 capsule 0  . vitamin C (ASCORBIC ACID) 500 MG tablet Take 500 mg by mouth every other day.    Marland Kitchen acetaminophen (TYLENOL) 500 MG tablet Take 500 mg by mouth every 6 (six) hours as needed for moderate pain or headache.    . diazepam (VALIUM) 2 MG tablet Take 1 tablet (2 mg total) by mouth every 6 (six) hours as needed for anxiety. (Patient not taking: Reported on 09/10/2018) 30 tablet 0  . HYDROcodone-acetaminophen (NORCO) 5-325 MG tablet Take 1 tablet by mouth every 8 (eight) hours as needed for moderate pain. (Patient not taking: Reported on 09/04/2018) 20 tablet 0  . lidocaine-prilocaine (EMLA) cream Apply 1 application topically as needed (port access).     No current facility-administered medications for this visit.    Facility-Administered Medications Ordered in Other Visits  Medication Dose Route Frequency Provider Last Rate Last Dose  . sodium chloride flush (NS) 0.9 % injection 10 mL  10 mL Intravenous PRN Lequita Asal, MD   10 mL at 04/17/18 0844    Review of Systems  Constitutional: Negative for chills, diaphoresis, fever, malaise/fatigue and weight loss (stable).       Feels "fine".  HENT: Negative.  Negative for congestion, ear discharge, ear pain, nosebleeds, sinus pain and sore throat.   Eyes: Negative.  Negative for blurred vision, double vision, photophobia, pain, discharge and redness.       Vision back to baseline.  Respiratory: Negative.  Negative for  cough, hemoptysis, sputum production and shortness of breath.   Cardiovascular: Negative.  Negative for chest pain, palpitations, orthopnea, leg swelling and PND.  Gastrointestinal: Negative.  Negative for abdominal pain, blood in stool, constipation, diarrhea, heartburn, melena, nausea and vomiting.  Genitourinary: Negative.  Negative for dysuria, frequency, hematuria and urgency.  Musculoskeletal: Negative.  Negative for back pain, falls, joint pain, myalgias and neck pain.  Skin: Negative.  Negative for itching and rash.  Neurological: Negative.  Negative for dizziness, tremors, speech change, focal weakness, weakness and headaches.  Endo/Heme/Allergies: Does not bruise/bleed easily.       Diabetes.  Vasomotor symptoms- stable.   Psychiatric/Behavioral: Negative for depression and memory loss. The patient is not nervous/anxious and does not have insomnia.   All other  systems reviewed and are negative.  Performance status (ECOG): 0  Vital Signs BP 106/74 (BP Location: Right Arm, Patient Position: Sitting)   Pulse 70   Temp (!) 97.2 F (36.2 C) (Tympanic)   Resp 18   Ht 5' 7"  (1.702 m)   Wt 183 lb 3.2 oz (83.1 kg)   SpO2 100%   BMI 28.69 kg/m   Physical Exam  Constitutional: She is oriented to person, place, and time and well-developed, well-nourished, and in no distress. No distress.  HENT:  Head: Normocephalic and atraumatic.  Mouth/Throat: Oropharynx is clear and moist and mucous membranes are normal. No oropharyngeal exudate.  Short dark hair.  Eyes: Pupils are equal, round, and reactive to light. Conjunctivae and EOM are normal. No scleral icterus.  Neck: Normal range of motion. Neck supple. No JVD present.  Cardiovascular: Normal rate, regular rhythm, normal heart sounds and intact distal pulses. Exam reveals no gallop and no friction rub.  No murmur heard. Pulmonary/Chest: Effort normal and breath sounds normal. No respiratory distress. She has no wheezes. She has no  rales.  Abdominal: Soft. Bowel sounds are normal. She exhibits no distension and no mass. There is no abdominal tenderness. There is no rebound and no guarding.  Musculoskeletal: Normal range of motion.        General: No tenderness or edema.  Lymphadenopathy:    She has no cervical adenopathy.    She has no axillary adenopathy.       Right: No supraclavicular adenopathy present.       Left: No supraclavicular adenopathy present.  Neurological: She is alert and oriented to person, place, and time. Gait normal.  Skin: Skin is warm and dry. No rash noted. She is not diaphoretic. No erythema. No pallor.  Psychiatric: Mood, affect and judgment normal.  Nursing note and vitals reviewed.   Infusion on 09/10/2018  Component Date Value Ref Range Status  . Preg Test, Ur 09/10/2018 NEGATIVE  NEGATIVE Final   Performed at [facility], [address], [place], [place] [address]  . Magnesium 09/10/2018 1.8  1.7 - 2.4 mg/dL Final   Performed at [facility], [address], [place], [place] [address]  . Sodium 09/10/2018 137  135 - 145 mmol/L Final  . Potassium 09/10/2018 3.7  3.5 - 5.1 mmol/L Final  . Chloride 09/10/2018 105  98 - 111 mmol/L Final  . CO2 09/10/2018 26  22 - 32 mmol/L Final  . Glucose, Bld 09/10/2018 110* 70 - 99 mg/dL Final  . BUN 09/10/2018 12  6 - 20 mg/dL Final  . Creatinine, Ser 09/10/2018 0.63  0.44 - 1.00 mg/dL Final  . Calcium 09/10/2018 9.1  8.9 - 10.3 mg/dL Final  . Total Protein 09/10/2018 7.4  6.5 - 8.1 g/dL Final  . Albumin 09/10/2018 4.0  3.5 - 5.0 g/dL Final  . AST 09/10/2018 33  15 - 41 U/L Final  . ALT 09/10/2018 33  0 - 44 U/L Final  . Alkaline Phosphatase 09/10/2018 102  38 - 126 U/L Final  . Total Bilirubin 09/10/2018 0.8  0.3 - 1.2 mg/dL Final  . GFR calc non Af Amer 09/10/2018 >60  >60 mL/min Final  . GFR calc Af Amer 09/10/2018 >60  >60 mL/min Final  . Anion gap 09/10/2018 6  5 - 15 Final   Performed at [facility], [address], [place], [place] [address]  . WBC 09/10/2018 4.5  4.0 - 10.5 K/uL Final  .  RBC 09/10/2018 4.39  3.87 - 5.11 MIL/uL Final  . Hemoglobin 09/10/2018 12.5  12.0 - 15.0 g/dL Final  . HCT 09/10/2018 36.9  36.0 - 46.0 % Final  . MCV 09/10/2018 84.1  80.0 - 100.0 fL Final  . MCH 09/10/2018 28.5  26.0 - 34.0 pg Final  . MCHC 09/10/2018 33.9  30.0 - 36.0 g/dL Final  . RDW 09/10/2018 14.3  11.5 - 15.5 % Final  . Platelets 09/10/2018 223  150 - 400 K/uL Final  . nRBC 09/10/2018 0.0  0.0 - 0.2 % Final  . Neutrophils Relative % 09/10/2018 62  % Final  . Neutro Abs 09/10/2018 2.8  1.7 - 7.7 K/uL Final  . Lymphocytes Relative 09/10/2018 25  % Final  . Lymphs Abs 09/10/2018 1.1  0.7 - 4.0 K/uL Final  . Monocytes Relative 09/10/2018 9  % Final  . Monocytes Absolute 09/10/2018 0.4  0.1 - 1.0 K/uL Final  . Eosinophils Relative 09/10/2018 3  % Final  . Eosinophils Absolute 09/10/2018 0.1  0.0 - 0.5 K/uL Final  . Basophils Relative 09/10/2018 1  % Final  . Basophils Absolute 09/10/2018 0.0  0.0 - 0.1 K/uL Final  . Immature Granulocytes 09/10/2018 0  % Final  . Abs Immature Granulocytes 09/10/2018 0.02  0.00 - 0.07 K/uL Final   Performed at [facility], [address], [place], [place] [address]    Assessment:  TEKESHA ALMGREN is a 43 y.o. 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  Pathology revealed no residual carcinoma in the breast.  One of three sentinel lymph nodes were positive.  One lymph node had 2 metastatic foci (2.1 mm and 1 mm).  Pathologic stage revealed ypT0 ypN1a.  Index breast mass and axillary node biopsy on 12/06/2017 revealed grade II invasive ductal carcinoma with calcifications at the 11 o'clock position.  There was metastatic carcinoma in 1 of 1 lymph nodes.  Tumor was ER + (100%), PR + (100%), and Ki67 5%.  Her2/neu was 3+ by IHC  (heterogeneous) and FISH +.  Diagnostic left mammogram and ultrasound on 12/06/2017 revealed a 1.7 x 1.3 x 1.3 cm irregular hypoechoic mass with internal calcifications at the 11 o'clock position 2 cm from the nipple with internal calcifications. There was a similar appearing 0.9 x 0.8 x 0.7 cm mass at the 11 o'clock position 4 cm from the nipple (2.4 cm from the index lesion).  There were suspicious, segmental calcifications originating from the index lesion and extending 5 cm posteriorly.  There was a 0.6 cm morphologically abnormal left axillary lymph node.  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 The adjacent and more superiorly located 11 o'clock mass (7 x 12 mm) seen on recent ultrasound, 4 cm from the nipple on ultrasound, was also identified. There were 2 probable satellite lesions (7 mm, medial lesion; posterior and lateral lesion). The total span of malignancy was 3 x 3.1 x 4.5 cm.  There were calcifications extending up to 5 cm posterior to the biopsied malignancy which were highly suspicious on mammography but not appreciated.  There was a mass in the lower outer left breast measured 5 mm, representing a change.  The known metastatic node in the left axilla was identified. A node along the posterolateral margin  of the pectoralis minor (cortex 5 mm) was nonspecific but at least somewhat suspicious.  Chest, abdomen, and pelvic CT on 12/19/2017 revealed a solitary enlarged biopsy-proven metastatic left axillary lymph node.  There were no additional findings suspicious for metastatic disease in the chest, abdomen or pelvis.  There was a nonspecific tiny sclerotic upper right sacral lesion, more likely a benign bone island. Bone scintigraphy correlation versus follow-up CT could be considered.  Bone scan on 01/11/2018 was negative for metastatic pattern. No areas of focal tracer  uptake. Area of concern in sacrum on CT likely represents benign bone island.   [facility] genetic testing on 08/26/2013 was negative for BRCA1 and 2. 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  She has a family history significant for breast, ovarian, pancreatic, and prostate cancer.  She received 6 cycles of TCHP chemotherapy (12/21/2017 - 04/17/2018) with Margarette Canada support. Cycle #3 was held due to elevated LFTs on 02/04/2018. She received Herceptin + Perjeta alone (04/30/2018 - 05/30/2018).  She is s/p 3 cycles of Kadcyla (06/20/2018 - 07/11/2018; 08/20/2018).  Cycle #3 was held on 08/01/2018 due to visual changes (blurred vision).  She received breast radiation from 06/26/2018 - 08/05/2018.  Echocardiograms:  12/20/2017 - EF of 60-65%, 03/27/2018 - EF of 60-65%, and 06/18/2018 - EF of 55-60%.  She has a history of elevated LFTs.  Hepatitis B and C serologies were negative on 02/04/2018.  RUQ ultrasound on 02/06/2018 revealed a small anterior gallbladder wall polyp. There was no evidence of cholelithiasis or cholecystis. CBD measured normal at 2 mm, with no evidence of choledocholithiasis. There was normal direction of blood flow towards the liver noted.  Symptomatically, she is doing well.  She denies any visual changes.  Exam is unremarkable.  Plan: 1. Labs:  CBC with diff, CMP, Mg, urine pregnancy test. 2. Stage IB multifocal LEFT breast cancer Clinically doing well. Radiation complete. Continue Kadcyla cycle #4 today. Continue echo every 3 months (due 09/17/2018). Discuss plan for initiation of endocrine at next visit. Discuss symptom management.  She has antiemetics and pain medications at home to use on a prn bases.  Interventions are adequate.    3. Vision changes    Vision back to baseline.    Visual changes due to Kadcyla appear reversible.    Patient feels comfortable proceeding with treatment.    Patient to contact clinic if any worsening of vision. 4.  History of abnormal LFTs LFTs normal. Continue to monitor. 5. Vasomotor symptoms    Doing well on Effexor 37.5 mg a day. 6. RTC in 3 weeks for MD assessment, labs (CBC with diff, CMP, Mg, FSH, estradiol), and Kadcyla.   Lequita Asal, MD  09/10/18, 4:35 PM

## 2018-09-10 NOTE — Progress Notes (Signed)
No new changes noted today 

## 2018-09-11 ENCOUNTER — Encounter: Payer: Self-pay | Admitting: Urgent Care

## 2018-09-11 ENCOUNTER — Other Ambulatory Visit: Payer: Self-pay

## 2018-09-11 ENCOUNTER — Ambulatory Visit
Admission: RE | Admit: 2018-09-11 | Discharge: 2018-09-11 | Disposition: A | Discharge status: Home / Self Care | Payer: BC Managed Care – PPO | Source: Ambulatory Visit | Attending: Radiation Oncology | Admitting: Radiation Oncology

## 2018-09-11 ENCOUNTER — Encounter: Payer: Self-pay | Admitting: Radiation Oncology

## 2018-09-11 VITALS — BP 126/83 | HR 90 | Temp 96.7°F | Resp 18 | Wt 182.3 lb

## 2018-09-11 DIAGNOSIS — Z170 Estrogen receptor positive status [ER+]: Secondary | ICD-10-CM | POA: Insufficient documentation

## 2018-09-11 DIAGNOSIS — Z9012 Acquired absence of left breast and nipple: Secondary | ICD-10-CM | POA: Diagnosis not present

## 2018-09-11 DIAGNOSIS — C50412 Malignant neoplasm of upper-outer quadrant of left female breast: Secondary | ICD-10-CM | POA: Diagnosis not present

## 2018-09-11 DIAGNOSIS — Z923 Personal history of irradiation: Secondary | ICD-10-CM | POA: Diagnosis not present

## 2018-09-11 DIAGNOSIS — C50411 Malignant neoplasm of upper-outer quadrant of right female breast: Secondary | ICD-10-CM

## 2018-09-12 ENCOUNTER — Other Ambulatory Visit: Payer: Self-pay | Admitting: Hematology and Oncology

## 2018-09-12 DIAGNOSIS — C50419 Malignant neoplasm of upper-outer quadrant of unspecified female breast: Secondary | ICD-10-CM

## 2018-09-13 ENCOUNTER — Ambulatory Visit: Payer: BC Managed Care – PPO | Admitting: Physical Therapy

## 2018-09-16 ENCOUNTER — Telehealth: Payer: Self-pay | Admitting: Hematology and Oncology

## 2018-09-16 NOTE — Telephone Encounter (Signed)
Re:  Endocrine therapy  Discussed consideration of initiation of endocrine therapy (tamoxifen versus an AI) as patient has finished radiation.  She is currently receiving Kadcyla.  Her LMP was 11/2017.  We discussed checking FSH and estradiol.  We discussed side effects of tamoxifen and aromatase inhibitors.  We discussed the use of goserelin with an aromatase inhibitor.  Several questions were asked and answered.   Lequita Asal, MD

## 2018-09-22 ENCOUNTER — Encounter: Payer: Self-pay | Admitting: Physical Therapy

## 2018-09-22 NOTE — Therapy (Unsigned)
[facility] [address], [place], [address] Phone: [phone]   Fax:  [phone]  Patient Details  Name: Denise [name] MRN: [id number] Date of Birth: Nov 12, 1975 Referring Provider:  No ref. provider found  Encounter Date: 09/22/2018 Spoke with patient; says she is doing well. R mastectomy for 3/30 was cancelled. Reports she is having increased tension on the back side of her shoulder blade that she thinks may be a medication side effect. Sent patient rhomboid stretch, posterior capsule stretch, sleeper stretch. Told patient to follow up as needed. Patient interested in telehealth  [facility] PT, DPT Shelton Silvas 09/22/2018, 1:39 PM  [facility] [address], [place], [address] Phone: [phone]   Fax:  [phone]

## 2018-09-24 ENCOUNTER — Encounter: Payer: BC Managed Care – PPO | Admitting: Plastic Surgery

## 2018-09-24 ENCOUNTER — Encounter: Payer: Self-pay | Admitting: Hematology and Oncology

## 2018-09-27 ENCOUNTER — Ambulatory Visit
Admission: RE | Admit: 2018-09-27 | Discharge: 2018-09-27 | Disposition: A | Discharge status: Home / Self Care | Payer: BC Managed Care – PPO | Source: Ambulatory Visit | Attending: Hematology and Oncology | Admitting: Hematology and Oncology

## 2018-09-27 ENCOUNTER — Other Ambulatory Visit: Payer: Self-pay

## 2018-09-27 DIAGNOSIS — Z8041 Family history of malignant neoplasm of ovary: Secondary | ICD-10-CM | POA: Insufficient documentation

## 2018-09-27 DIAGNOSIS — C50419 Malignant neoplasm of upper-outer quadrant of unspecified female breast: Secondary | ICD-10-CM | POA: Insufficient documentation

## 2018-09-27 DIAGNOSIS — Z803 Family history of malignant neoplasm of breast: Secondary | ICD-10-CM | POA: Diagnosis not present

## 2018-09-27 DIAGNOSIS — Z923 Personal history of irradiation: Secondary | ICD-10-CM | POA: Insufficient documentation

## 2018-09-27 DIAGNOSIS — G43109 Migraine with aura, not intractable, without status migrainosus: Secondary | ICD-10-CM | POA: Insufficient documentation

## 2018-09-27 DIAGNOSIS — Z9221 Personal history of antineoplastic chemotherapy: Secondary | ICD-10-CM | POA: Diagnosis not present

## 2018-09-27 NOTE — Progress Notes (Signed)
*  PRELIMINARY RESULTS* Echocardiogram 2D Echocardiogram has been performed.  Denise [name] 09/27/2018, 10:43 AM

## 2018-09-30 ENCOUNTER — Other Ambulatory Visit: Payer: Self-pay

## 2018-09-30 NOTE — Progress Notes (Signed)
[facility]     [address]     [place], [place] [address]     Phone: [phone]      Fax: [phone]        Clinic day:  10/01/2018   Chief Complaint: Denise [name] is a 43 y.o. female with multi-focal Her2/neu + left breast cancer who is seen for assessment prior to cycle #5 Kadcyla.  HPI: The patient was last seen in the medical oncology clinic on 09/10/2018.  At that time, she was doing well.  She denied any visual changes.  Exam was unremarkable.   She received cycle #4 Kadcyla.    After her appointment, we discussed initiation of endocrine therapy.  Last menstrual period was 11/2017.  We discussed checking FSH and estradiol.  We discussed side effects of tamoxifen and aromatase inhibitors.  We discussed the use of goserelin with an aromatase inhibitor.  Echo on 09/27/2018 revealed an EF of 55-60%.  During the interim, she has felt "pretty good".  She denies any breast concerns.  She denies any visual changes.  She states that she is drinking a lot of water.  She denies any hot flashes or night sweats.  She stopped Effexor about 2 weeks ago.  She has thought about our conversation regarding hormonal therapy.  She wishes to begin tamoxifen.   Past Medical History:  Diagnosis Date   Abnormal breast biopsy    PASH, [name]   Breast cancer [facility]) 11/2017   left breast   Family history of breast cancer    Family history of ovarian cancer    Fibroadenoma of breast, left 2000, 2016   Genetic screening 08/26/2013   BRCA/BART negative/Myriad, CHEK2 POS 2017   Increased risk of breast cancer 2017   IBIS=47%   Left breast lump 06/18/2017   Fluid/drained [name]   Migraine    Monoallelic mutation of CHEK2 gene in female patient 2017   increased risk of breast and colon cancer   Personal history of chemotherapy    Personal history of radiation therapy     Past Surgical History:  Procedure Laterality Date   BREAST  BIOPSY Bilateral 09/08/14   neg   BREAST BIOPSY Left 03/16/2015   Procedure: BREAST BIOPSY WITH NEEDLE LOCALIZATION;  Surgeon: [name], MD;  Location: [facility] ORS;  Service: General;  Laterality: Left;   BREAST BIOPSY Left 11/2017   positive   BREAST CYST ASPIRATION Left 06/18/2017   cyst aspiration   BREAST EXCISIONAL BIOPSY Left 09/2014   BREAST EXCISIONAL BIOPSY Right    age 54's   BREAST RECONSTRUCTION WITH PLACEMENT OF TISSUE EXPANDER AND FLEX HD (ACELLULAR HYDRATED DERMIS) Left 05/20/2018   Procedure: BREAST RECONSTRUCTION WITH PLACEMENT OF TISSUE EXPANDER AND FLEX HD (ACELLULAR HYDRATED DERMIS);  Surgeon: [name], DO;  Location: [facility] ORS;  Service: Plastics;  Laterality: Left;   BREAST SURGERY Right March 2000   benign fibroadenoma   BREAST SURGERY Left 08/08/13   excision   BREAST SURGERY Left 09/23/14   excision   MASTECTOMY Left 05/20/2018   MASTECTOMY W/ SENTINEL NODE BIOPSY Left 05/20/2018   Procedure: MASTECTOMY WITH SENTINEL LYMPH NODE BIOPSY;  Surgeon: [name], MD;  Location: [facility] ORS;  Service: General;  Laterality: Left;   PORTACATH PLACEMENT Right 12/17/2017   Procedure: INSERTION PORT-A-CATH;  Surgeon: [name], MD;  Location: [facility] ORS;  Service: General;  Laterality: Right;    Family History  Problem  Relation Age of Onset   Prostate cancer Father 53   Breast cancer Paternal Grandmother 70   Breast cancer Maternal Aunt 60   Ovarian cancer Maternal Aunt 70   Breast cancer Paternal Aunt 33   Pancreatic cancer Maternal Aunt 75    Social History:  reports that she has never smoked. She has never used smokeless tobacco. She reports current alcohol use. She reports that she does not use drugs.  She does not smoke. She "occasionally" drinks alcohol. 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 Patient denies known exposures to radiation on toxins. Her husband's name is [name]. She has 2 daughters,  [name] and [name] (ages 49 1/2 and 14).  Her husband's name is [name].  The patient is alone today.  Allergies:  Allergies  Allergen Reactions   Steri-Strip Compound Benzoin [Benzoin Compound]     Steri-strip ,    Tape Other (See Comments)    Minor skin irritation     Current Medications: Current Outpatient Medications  Medication Sig Dispense Refill   magnesium oxide (MAG-OX) 400 MG tablet Take 1 tablet (400 mg total) by mouth daily. (Patient taking differently: Take 400 mg by mouth 2 (two) times a week. ) 30 tablet 0   acetaminophen (TYLENOL) 500 MG tablet Take 500 mg by mouth every 6 (six) hours as needed for moderate pain or headache.     diazepam (VALIUM) 2 MG tablet Take 1 tablet (2 mg total) by mouth every 6 (six) hours as needed for anxiety. (Patient not taking: Reported on 09/10/2018) 30 tablet 0   HYDROcodone-acetaminophen (NORCO) 5-325 MG tablet Take 1 tablet by mouth every 8 (eight) hours as needed for moderate pain. (Patient not taking: Reported on 09/04/2018) 20 tablet 0   lidocaine-prilocaine (EMLA) cream Apply 1 application topically as needed (port access).     omeprazole (PRILOSEC OTC) 20 MG tablet Take 20 mg by mouth daily as needed (for acid reflux).     venlafaxine XR (EFFEXOR-XR) 37.5 MG 24 hr capsule TAKE 1 CAPSULE BY MOUTH ONCE DAILY WITH BREAKFAST (Patient not taking: No sig reported) 90 capsule 0   vitamin C (ASCORBIC ACID) 500 MG tablet Take 500 mg by mouth every other day.     No current facility-administered medications for this visit.    Facility-Administered Medications Ordered in Other Visits  Medication Dose Route Frequency Provider Last Rate Last Dose   heparin lock flush 100 unit/mL  500 Units Intravenous Once Elizabet Schweppe C, MD       sodium chloride flush (NS) 0.9 % injection 10 mL  10 mL Intravenous PRN Mike Gip, Sia Gabrielsen C, MD   10 mL at 04/17/18 0844   sodium chloride flush (NS) 0.9 % injection 10 mL  10 mL Intravenous PRN Lequita Asal, MD        Review of Systems  Constitutional: Negative.  Negative for chills, diaphoresis, fever, malaise/fatigue and weight loss.       Feels "pretty good".  HENT: Negative.  Negative for congestion, ear discharge, ear pain, hearing loss, nosebleeds, sinus pain, sore throat and tinnitus.   Eyes: Negative.  Negative for blurred vision, double vision, photophobia, pain, discharge and redness.       No visual changes.  Respiratory: Negative.  Negative for cough, hemoptysis, sputum production and shortness of breath.   Cardiovascular: Negative.  Negative for chest pain, palpitations, orthopnea, leg swelling and PND.  Gastrointestinal: Negative.  Negative for abdominal pain, blood in stool, constipation, diarrhea,  heartburn, melena, nausea and vomiting.  Genitourinary: Negative.  Negative for dysuria, frequency, hematuria and urgency.  Musculoskeletal: Negative.  Negative for back pain, falls, joint pain, myalgias and neck pain.  Skin: Negative.  Negative for itching and rash.  Neurological: Negative.  Negative for dizziness, tremors, speech change, focal weakness, weakness and headaches.  Endo/Heme/Allergies: Does not bruise/bleed easily.       Diabetes is "ok".   Psychiatric/Behavioral: Negative for depression and memory loss. The patient is not nervous/anxious and does not have insomnia.   All other systems reviewed and are negative.  Performance status (ECOG): 0  Vital Signs BP 115/76 (BP Location: Right Arm, Patient Position: Sitting)    Pulse 87    Temp (!) 97 F (36.1 C) (Tympanic)    Resp 18    Ht 5' 7"  (1.702 m)    Wt 185 lb 1.2 oz (84 kg)    SpO2 100%    BMI 28.99 kg/m   Physical Exam  Constitutional: She is oriented to person, place, and time and well-developed, well-nourished, and in no distress. No distress.  HENT:  Head: Normocephalic and atraumatic.  Mouth/Throat: Oropharynx is clear and moist and mucous membranes are normal. No oropharyngeal exudate.  Short dark  hair.  Eyes: Pupils are equal, round, and reactive to light. Conjunctivae and EOM are normal. No scleral icterus.  Neck: Normal range of motion. Neck supple. No JVD present.  Cardiovascular: Normal rate, regular rhythm, normal heart sounds and intact distal pulses. Exam reveals no gallop and no friction rub.  No murmur heard. Pulmonary/Chest: Effort normal and breath sounds normal. No respiratory distress. She has no wheezes. She has no rales.  Abdominal: Bowel sounds are normal. She exhibits no distension and no mass. There is no abdominal tenderness. There is no rebound and no guarding.  Musculoskeletal: Normal range of motion.        General: No tenderness or edema.  Lymphadenopathy:    She has no cervical adenopathy.    She has no axillary adenopathy.       Right: No supraclavicular adenopathy present.       Left: No supraclavicular adenopathy present.  Neurological: She is alert and oriented to person, place, and time. Gait normal.  Skin: Skin is warm. No rash noted. She is not diaphoretic. No erythema. No pallor.  Psychiatric: Mood, affect and judgment normal.  Nursing note and vitals reviewed.   Infusion on 10/01/2018  Component Date Value Ref Range Status   Magnesium 10/01/2018 1.8  1.7 - 2.4 mg/dL Final   Performed at [facility], [address] [place], [place] [address]   Sodium 10/01/2018 131* 135 - 145 mmol/L Final   Potassium 10/01/2018 3.4* 3.5 - 5.1 mmol/L Final   Chloride 10/01/2018 100  98 - 111 mmol/L Final   CO2 10/01/2018 25  22 - 32 mmol/L Final   Glucose, Bld 10/01/2018 119* 70 - 99 mg/dL Final   BUN 10/01/2018 12  6 - 20 mg/dL Final   Creatinine, Ser 10/01/2018 0.55  0.44 - 1.00 mg/dL Final   Calcium 10/01/2018 8.7* 8.9 - 10.3 mg/dL Final   Total Protein 10/01/2018 7.3  6.5 - 8.1 g/dL Final   Albumin 10/01/2018 3.9  3.5 - 5.0 g/dL Final   AST 10/01/2018 29  15 - 41 U/L Final   ALT 10/01/2018 28  0 - 44 U/L Final   Alkaline  Phosphatase 10/01/2018 103  38 - 126 U/L Final   Total Bilirubin 10/01/2018 1.3*  0.3 - 1.2 mg/dL Final   GFR calc non Af Amer 10/01/2018 >60  >60 mL/min Final   GFR calc Af Amer 10/01/2018 >60  >60 mL/min Final   Anion gap 10/01/2018 6  5 - 15 Final   Performed at [facility], [address], [place], [place] [address]   WBC 10/01/2018 5.2  4.0 - 10.5 K/uL Final   RBC 10/01/2018 4.38  3.87 - 5.11 MIL/uL Final   Hemoglobin 10/01/2018 12.3  12.0 - 15.0 g/dL Final   HCT 10/01/2018 36.7  36.0 - 46.0 % Final   MCV 10/01/2018 83.8  80.0 - 100.0 fL Final   MCH 10/01/2018 28.1  26.0 - 34.0 pg Final   MCHC 10/01/2018 33.5  30.0 - 36.0 g/dL Final   RDW 10/01/2018 14.8  11.5 - 15.5 % Final   Platelets 10/01/2018 235  150 - 400 K/uL Final   nRBC 10/01/2018 0.0  0.0 - 0.2 % Final   Neutrophils Relative % 10/01/2018 64  % Final   Neutro Abs 10/01/2018 3.4  1.7 - 7.7 K/uL Final   Lymphocytes Relative 10/01/2018 25  % Final   Lymphs Abs 10/01/2018 1.3  0.7 - 4.0 K/uL Final   Monocytes Relative 10/01/2018 8  % Final   Monocytes Absolute 10/01/2018 0.4  0.1 - 1.0 K/uL Final   Eosinophils Relative 10/01/2018 2  % Final   Eosinophils Absolute 10/01/2018 0.1  0.0 - 0.5 K/uL Final   Basophils Relative 10/01/2018 1  % Final   Basophils Absolute 10/01/2018 0.0  0.0 - 0.1 K/uL Final   Immature Granulocytes 10/01/2018 0  % Final   Abs Immature Granulocytes 10/01/2018 0.02  0.00 - 0.07 K/uL Final   Performed at [facility], [address], [place], [place] [address]   Preg Test, Ur 10/01/2018 NEGATIVE  NEGATIVE Final   Performed at [facility], [address], [place], [place] [address]    Assessment:  Denise [name] is a 43 y.o. 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  Pathology revealed no residual carcinoma in the  breast.  One of three sentinel lymph nodes were positive.  One lymph node had 2 metastatic foci (2.1 mm and 1 mm).  Pathologic stage revealed ypT0 ypN1a.  Index breast mass and axillary node biopsy on 12/06/2017 revealed grade II invasive ductal carcinoma with calcifications at the 11 o'clock position.  There was metastatic carcinoma in 1 of 1 lymph nodes.  Tumor was ER + (100%), PR + (100%), and Ki67 5%.  Her2/neu was 3+ by IHC (heterogeneous) and FISH +.  Diagnostic left mammogram and ultrasound on 12/06/2017 revealed a 1.7 x 1.3 x 1.3 cm irregular hypoechoic mass with internal calcifications at the 11 o'clock position 2 cm from the nipple with internal calcifications. There was a similar appearing 0.9 x 0.8 x 0.7 cm mass at the 11 o'clock position 4 cm from the nipple (2.4 cm from the index lesion).  There were suspicious, segmental calcifications originating from the index lesion and extending 5 cm posteriorly.  There was a 0.6 cm morphologically abnormal left axillary lymph node.  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 The adjacent and more superiorly located 11 o'clock mass (7 x 12 mm) seen on recent  ultrasound, 4 cm from the nipple on ultrasound, was also identified. There were 2 probable satellite lesions (7 mm, medial lesion; posterior and lateral lesion). The total span of malignancy was 3 x 3.1 x 4.5 cm.  There were calcifications extending up to 5 cm posterior to the biopsied malignancy which were highly suspicious on mammography but not appreciated.  There was a mass in the lower outer left breast measured 5 mm, representing a change.  The known metastatic node in the left axilla was identified. A node along the posterolateral margin of the pectoralis minor (cortex 5 mm) was nonspecific but at least somewhat suspicious.  Chest, abdomen, and pelvic CT on 12/19/2017 revealed  a solitary enlarged biopsy-proven metastatic left axillary lymph node.  There were no additional findings suspicious for metastatic disease in the chest, abdomen or pelvis.  There was a nonspecific tiny sclerotic upper right sacral lesion, more likely a benign bone island. Bone scintigraphy correlation versus follow-up CT could be considered.  Bone scan on 01/11/2018 was negative for metastatic pattern. No areas of focal tracer uptake. Area of concern in sacrum on CT likely represents benign bone island.   Myriad genetic testing on 08/26/2013 was negative for BRCA1 and 2. 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  She has a family history significant for breast, ovarian, pancreatic, and prostate cancer.  She received 6 cycles of TCHP chemotherapy (12/21/2017 - 04/17/2018) with Margarette Canada support. Cycle #3 was held due to elevated LFTs on 02/04/2018. She received Herceptin + Perjeta alone (04/30/2018 - 05/30/2018).  She is s/p 4 cycles of Kadcyla (06/20/2018 - 07/11/2018; 08/20/2018- 09/10/2018).  Cycle #3 was held on 08/01/2018 due to visual changes (blurred vision).  She received breast radiation from 06/26/2018 - 08/05/2018.  Echocardiograms:  12/20/2017 - EF of 60-65%, 03/27/2018 - EF of 60-65%, 06/18/2018 - EF of 55-60%, and 09/27/2018 - EF of 55-60%..  She has a history of elevated LFTs.  Hepatitis B and C serologies were negative on 02/04/2018.  RUQ ultrasound on 02/06/2018 revealed a small anterior gallbladder wall polyp. There was no evidence of cholelithiasis or cholecystis. CBD measured normal at 2 mm, with no evidence of choledocholithiasis. There was normal direction of blood flow towards the liver noted.  She has vasomotor symptoms.  She discontinued Effexor around 09/18/2018.  She has had no menses since 11/2017.  Symptomatically, she feels good.  She denies any visual changes.  She stopped Effexor.  Exam is stable.  Bilirubin is  1.3.  Plan: 1. Labs:  CBC  with diff, CMP, Mg, estradiol, FSH, urine pregnancy test. 2. Stage IB multifocal LEFT breast cancer Clinically, she continues to do well. Echo on 09/27/2018 was normal. No current side effects associated with Kadcyla. Kadcyla postponed today secondary to elevated bilirubin. Discuss initiation of tamoxifen.  Side effects reviewed.  Rx:  tamoxifen 20 mg a day. RTC on 10/04/2018 for cycle #5 Kadcycla  3. Vision changes    No current visual issues initially felt secondary to Kadcyla.    Continue surveillance. 4. History of abnormal LFTs Bilirubin 1.3 today. Fractionate bilirubin.   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 5. Vasomotor symptoms    Patient self discontinued Effexor 37.5 mg a day.    Continue to monitor. 6.   RTC on 10/04/2018 for labs (CMP) and Kadcyla. 7.   RTC on 10/25/2018 for MD assessment, labs (CBC with diff, CMP, Mg), and Kadcyla   Lequita Asal, MD  10/01/2018, 10:18 AM

## 2018-10-01 ENCOUNTER — Encounter: Payer: Self-pay | Admitting: Hematology and Oncology

## 2018-10-01 ENCOUNTER — Inpatient Hospital Stay: Payer: BC Managed Care – PPO | Attending: Hematology and Oncology | Admitting: Hematology and Oncology

## 2018-10-01 ENCOUNTER — Inpatient Hospital Stay: Payer: BC Managed Care – PPO

## 2018-10-01 VITALS — BP 115/76 | HR 87 | Temp 97.0°F | Resp 18 | Ht 67.0 in | Wt 185.1 lb

## 2018-10-01 DIAGNOSIS — C50419 Malignant neoplasm of upper-outer quadrant of unspecified female breast: Secondary | ICD-10-CM

## 2018-10-01 DIAGNOSIS — Z421 Encounter for breast reconstruction following mastectomy: Secondary | ICD-10-CM | POA: Diagnosis not present

## 2018-10-01 DIAGNOSIS — R17 Unspecified jaundice: Secondary | ICD-10-CM

## 2018-10-01 DIAGNOSIS — Z801 Family history of malignant neoplasm of trachea, bronchus and lung: Secondary | ICD-10-CM | POA: Insufficient documentation

## 2018-10-01 DIAGNOSIS — C773 Secondary and unspecified malignant neoplasm of axilla and upper limb lymph nodes: Secondary | ICD-10-CM | POA: Insufficient documentation

## 2018-10-01 DIAGNOSIS — Z923 Personal history of irradiation: Secondary | ICD-10-CM

## 2018-10-01 DIAGNOSIS — Z79899 Other long term (current) drug therapy: Secondary | ICD-10-CM | POA: Diagnosis not present

## 2018-10-01 DIAGNOSIS — Z8041 Family history of malignant neoplasm of ovary: Secondary | ICD-10-CM | POA: Diagnosis not present

## 2018-10-01 DIAGNOSIS — Z9012 Acquired absence of left breast and nipple: Secondary | ICD-10-CM | POA: Diagnosis not present

## 2018-10-01 DIAGNOSIS — Z803 Family history of malignant neoplasm of breast: Secondary | ICD-10-CM | POA: Diagnosis not present

## 2018-10-01 DIAGNOSIS — Z9221 Personal history of antineoplastic chemotherapy: Secondary | ICD-10-CM | POA: Diagnosis not present

## 2018-10-01 DIAGNOSIS — C50512 Malignant neoplasm of lower-outer quadrant of left female breast: Secondary | ICD-10-CM | POA: Diagnosis not present

## 2018-10-01 DIAGNOSIS — Z170 Estrogen receptor positive status [ER+]: Secondary | ICD-10-CM | POA: Diagnosis not present

## 2018-10-01 DIAGNOSIS — Z8042 Family history of malignant neoplasm of prostate: Secondary | ICD-10-CM | POA: Diagnosis not present

## 2018-10-01 DIAGNOSIS — Z5112 Encounter for antineoplastic immunotherapy: Secondary | ICD-10-CM | POA: Diagnosis present

## 2018-10-01 DIAGNOSIS — N951 Menopausal and female climacteric states: Secondary | ICD-10-CM

## 2018-10-01 LAB — COMPREHENSIVE METABOLIC PANEL
ALT: 28 U/L (ref 0–44)
AST: 29 U/L (ref 15–41)
Albumin: 3.9 g/dL (ref 3.5–5.0)
Alkaline Phosphatase: 103 U/L (ref 38–126)
Anion gap: 6 (ref 5–15)
BUN: 12 mg/dL (ref 6–20)
CO2: 25 mmol/L (ref 22–32)
Calcium: 8.7 mg/dL — ABNORMAL LOW (ref 8.9–10.3)
Chloride: 100 mmol/L (ref 98–111)
Creatinine, Ser: 0.55 mg/dL (ref 0.44–1.00)
GFR calc Af Amer: >60 mL/min (ref >60)
GFR calc non Af Amer: >60 mL/min (ref >60)
Glucose, Bld: 119 mg/dL — ABNORMAL HIGH (ref 70–99)
Potassium: 3.4 mmol/L — ABNORMAL LOW (ref 3.5–5.1)
Sodium: 131 mmol/L — ABNORMAL LOW (ref 135–145)
Total Bilirubin: 1.3 mg/dL — ABNORMAL HIGH (ref 0.3–1.2)
Total Protein: 7.3 g/dL (ref 6.5–8.1)

## 2018-10-01 LAB — CBC WITH DIFFERENTIAL/PLATELET
Abs Immature Granulocytes: 0.02 10*3/uL (ref 0.00–0.07)
Basophils Absolute: 0 10*3/uL (ref 0.0–0.1)
Basophils Relative: 1 %
Eosinophils Absolute: 0.1 10*3/uL (ref 0.0–0.5)
Eosinophils Relative: 2 %
HCT: 36.7 % (ref 36.0–46.0)
Hemoglobin: 12.3 g/dL (ref 12.0–15.0)
Immature Granulocytes: 0 %
Lymphocytes Relative: 25 %
Lymphs Abs: 1.3 10*3/uL (ref 0.7–4.0)
MCH: 28.1 pg (ref 26.0–34.0)
MCHC: 33.5 g/dL (ref 30.0–36.0)
MCV: 83.8 fL (ref 80.0–100.0)
Monocytes Absolute: 0.4 10*3/uL (ref 0.1–1.0)
Monocytes Relative: 8 %
Neutro Abs: 3.4 10*3/uL (ref 1.7–7.7)
Neutrophils Relative %: 64 %
Platelets: 235 10*3/uL (ref 150–400)
RBC: 4.38 MIL/uL (ref 3.87–5.11)
RDW: 14.8 % (ref 11.5–15.5)
WBC: 5.2 10*3/uL (ref 4.0–10.5)
nRBC: 0 % (ref 0.0–0.2)

## 2018-10-01 LAB — PREGNANCY, URINE: Preg Test, Ur: NEGATIVE

## 2018-10-01 LAB — BILIRUBIN, DIRECT: Bilirubin, Direct: 0.1 mg/dL (ref 0.0–0.2)

## 2018-10-01 LAB — MAGNESIUM: Magnesium: 1.8 mg/dL (ref 1.7–2.4)

## 2018-10-01 MED ORDER — HEPARIN SOD (PORK) LOCK FLUSH 100 UNIT/ML IV SOLN
500.0000 [IU] | Freq: Once | INTRAVENOUS | Status: AC
  Administered 2018-10-01: 11:00:00 500 [IU] via INTRAVENOUS

## 2018-10-01 MED ORDER — SODIUM CHLORIDE 0.9% FLUSH
10.0000 mL | INTRAVENOUS | Status: DC | PRN
  Filled 2018-10-01: qty 10

## 2018-10-01 MED ORDER — TAMOXIFEN CITRATE 20 MG PO TABS: 20.0000 mg | ORAL_TABLET | Freq: Every day | ORAL | 3 refills | Status: DC

## 2018-10-01 NOTE — Progress Notes (Signed)
No new changes noted today 

## 2018-10-01 NOTE — Patient Instructions (Signed)
Tamoxifen oral tablet What is this medicine? TAMOXIFEN (ta MOX i fen) blocks the effects of estrogen. It is commonly used to treat breast cancer. It is also used to decrease the chance of breast cancer coming back in women who have received treatment for the disease. It may also help prevent breast cancer in women who have a high risk of developing breast cancer. This medicine may be used for other purposes; ask your health care provider or pharmacist if you have questions. COMMON BRAND NAME(S): Nolvadex What should I tell my health care provider before I take this medicine? They need to know if you have any of these conditions: -blood clots -blood disease -cataracts or impaired eyesight -endometriosis -high calcium levels -high cholesterol -irregular menstrual cycles -liver disease -stroke -uterine fibroids -an unusual reaction to tamoxifen, other medicines, foods, dyes, or preservatives -pregnant or trying to get pregnant -breast-feeding How should I use this medicine? Take this medicine by mouth with a glass of water. Follow the directions on the prescription label. You can take it with or without food. Take your medicine at regular intervals. Do not take your medicine more often than directed. Do not stop taking except on your doctor's advice. A special MedGuide will be given to you by the pharmacist with each prescription and refill. Be sure to read this information carefully each time. Talk to your pediatrician regarding the use of this medicine in children. While this drug may be prescribed for selected conditions, precautions do apply. Overdosage: If you think you have taken too much of this medicine contact a poison control center or emergency room at once. NOTE: This medicine is only for you. Do not share this medicine with others. What if I miss a dose? If you miss a dose, take it as soon as you can. If it is almost time for your next dose, take only that dose. Do not take  double or extra doses. What may interact with this medicine? Do not take this medicine with any of the following medications: -cisapride -certain medicines for irregular heart beat like dofetilide, dronedarone, quinidine -certain medicines for fungal infection like fluconazole, posaconazole -pimozide -saquinavir -thioridazine This medicine may also interact with the following medications: -aminoglutethimide -anastrozole -bromocriptine -chemotherapy drugs -female hormones, like estrogens and birth control pills -letrozole -medroxyprogesterone -phenobarbital -rifampin -warfarin This list may not describe all possible interactions. Give your health care provider a list of all the medicines, herbs, non-prescription drugs, or dietary supplements you use. Also tell them if you smoke, drink alcohol, or use illegal drugs. Some items may interact with your medicine. What should I watch for while using this medicine? Visit your doctor or health care professional for regular checks on your progress. You will need regular pelvic exams, breast exams, and mammograms. If you are taking this medicine to reduce your risk of getting breast cancer, you should know that this medicine does not prevent all types of breast cancer. If breast cancer or other problems occur, there is no guarantee that it will be found at an early stage. Do not become pregnant while taking this medicine or for 2 months after stopping it. Women should inform their doctor if they wish to become pregnant or think they might be pregnant. There is a potential for serious side effects to an unborn child. Talk to your health care professional or pharmacist for more information. Do not breast-feed an infant while taking this medicine or for 3 months after stopping it. This medicine may interfere with the  ability to have a child. Talk with your doctor or health care professional if you are concerned about your fertility. What side effects may  I notice from receiving this medicine? Side effects that you should report to your doctor or health care professional as soon as possible: -allergic reactions like skin rash, itching or hives, swelling of the face, lips, or tongue -changes in vision -changes in your menstrual cycle -difficulty walking or talking -new breast lumps -numbness -pelvic pain or pressure -redness, blistering, peeling or loosening of the skin, including inside the mouth -signs and symptoms of a dangerous change in heartbeat or heart rhythm like chest pain, dizziness, fast or irregular heartbeat, palpitations, feeling faint or lightheaded, falls, breathing problems -sudden chest pain -swelling, pain or tenderness in your calf or leg -unusual bruising or bleeding -vaginal discharge that is bloody, brown, or rust -weakness -yellowing of the whites of the eyes or skin Side effects that usually do not require medical attention (report to your doctor or health care professional if they continue or are bothersome): -fatigue -hair loss, although uncommon and is usually mild -headache -hot flashes -impotence (in men) -nausea, vomiting (mild) -vaginal discharge (white or clear) This list may not describe all possible side effects. Call your doctor for medical advice about side effects. You may report side effects to FDA at 1-800-FDA-1088. Where should I keep my medicine? Keep out of the reach of children. Store at room temperature between 20 and 25 degrees C (68 and 77 degrees F). Protect from light. Keep container tightly closed. Throw away any unused medicine after the expiration date. NOTE: This sheet is a summary. It may not cover all possible information. If you have questions about this medicine, talk to your doctor, pharmacist, or health care provider.  2019 Elsevier/Gold Standard (2017-09-26 16:49:57)

## 2018-10-02 ENCOUNTER — Telehealth: Payer: Self-pay

## 2018-10-02 LAB — FOLLICLE STIMULATING HORMONE: FSH: 48.2 m[IU]/mL

## 2018-10-02 LAB — ESTRADIOL: Estradiol: 219.1 pg/mL

## 2018-10-02 NOTE — Telephone Encounter (Signed)
-----   Message from Lequita Asal, MD sent at 10/02/2018  8:00 AM EDT ----- Regarding: Please call patient   Labs indicate she is premenopausal.  M ----- Message ----- From: Buel Ream, Lab [facility] [place] Sent: 10/01/2018   9:54 AM EDT To: Lequita Asal, MD

## 2018-10-02 NOTE — Telephone Encounter (Signed)
Informed patient of results and possible indication of being premenopausal. Patient verbalizes understanding and denies any questions or concerns.

## 2018-10-04 ENCOUNTER — Inpatient Hospital Stay: Payer: BC Managed Care – PPO

## 2018-10-04 ENCOUNTER — Inpatient Hospital Stay: Payer: BC Managed Care – PPO | Attending: Hematology and Oncology

## 2018-10-04 ENCOUNTER — Other Ambulatory Visit: Payer: Self-pay | Admitting: Hematology and Oncology

## 2018-10-04 ENCOUNTER — Other Ambulatory Visit: Payer: Self-pay

## 2018-10-04 VITALS — Temp 98.6°F | Resp 18

## 2018-10-04 DIAGNOSIS — C50412 Malignant neoplasm of upper-outer quadrant of left female breast: Secondary | ICD-10-CM

## 2018-10-04 DIAGNOSIS — Z170 Estrogen receptor positive status [ER+]: Secondary | ICD-10-CM

## 2018-10-04 DIAGNOSIS — C50512 Malignant neoplasm of lower-outer quadrant of left female breast: Secondary | ICD-10-CM | POA: Diagnosis not present

## 2018-10-04 DIAGNOSIS — C50419 Malignant neoplasm of upper-outer quadrant of unspecified female breast: Secondary | ICD-10-CM

## 2018-10-04 DIAGNOSIS — Z5112 Encounter for antineoplastic immunotherapy: Secondary | ICD-10-CM

## 2018-10-04 LAB — COMPREHENSIVE METABOLIC PANEL
ALT: 30 U/L (ref 0–44)
AST: 29 U/L (ref 15–41)
Albumin: 4 g/dL (ref 3.5–5.0)
Alkaline Phosphatase: 98 U/L (ref 38–126)
Anion gap: 5 (ref 5–15)
BUN: 16 mg/dL (ref 6–20)
CO2: 26 mmol/L (ref 22–32)
Calcium: 9 mg/dL (ref 8.9–10.3)
Chloride: 104 mmol/L (ref 98–111)
Creatinine, Ser: 0.53 mg/dL (ref 0.44–1.00)
GFR calc Af Amer: >60 mL/min (ref >60)
GFR calc non Af Amer: >60 mL/min (ref >60)
Glucose, Bld: 106 mg/dL — ABNORMAL HIGH (ref 70–99)
Potassium: 3.7 mmol/L (ref 3.5–5.1)
Sodium: 135 mmol/L (ref 135–145)
Total Bilirubin: 1 mg/dL (ref 0.3–1.2)
Total Protein: 7.5 g/dL (ref 6.5–8.1)

## 2018-10-04 MED ORDER — ACETAMINOPHEN 325 MG PO TABS
650.0000 mg | ORAL_TABLET | Freq: Once | ORAL | Status: AC
  Administered 2018-10-04: 650 mg via ORAL
  Filled 2018-10-04: qty 2

## 2018-10-04 MED ORDER — SODIUM CHLORIDE 0.9 % IV SOLN
320.0000 mg | Freq: Once | INTRAVENOUS | Status: AC
  Administered 2018-10-04: 320 mg via INTRAVENOUS
  Filled 2018-10-04: qty 16

## 2018-10-04 MED ORDER — HEPARIN SOD (PORK) LOCK FLUSH 100 UNIT/ML IV SOLN
500.0000 [IU] | Freq: Once | INTRAVENOUS | Status: AC | PRN
  Administered 2018-10-04: 500 [IU]
  Filled 2018-10-04: qty 5

## 2018-10-04 MED ORDER — DIPHENHYDRAMINE HCL 25 MG PO CAPS
50.0000 mg | ORAL_CAPSULE | Freq: Once | ORAL | Status: AC
  Administered 2018-10-04: 50 mg via ORAL
  Filled 2018-10-04: qty 2

## 2018-10-04 MED ORDER — SODIUM CHLORIDE 0.9 % IV SOLN
Freq: Once | INTRAVENOUS | Status: AC
  Administered 2018-10-04: 10:00:00 via INTRAVENOUS
  Filled 2018-10-04: qty 250

## 2018-10-04 MED ORDER — SODIUM CHLORIDE 0.9% FLUSH
10.0000 mL | INTRAVENOUS | Status: DC | PRN
  Administered 2018-10-04: 10 mL
  Filled 2018-10-04: qty 10

## 2018-10-04 NOTE — Patient Instructions (Signed)
Ado-Trastuzumab Emtansine for injection What is this medicine? ADO-TRASTUZUMAB EMTANSINE (ADD oh traz TOO zuh mab em TAN zine) is a monoclonal antibody combined with chemotherapy. It is used to treat breast cancer. This medicine may be used for other purposes; ask your health care provider or pharmacist if you have questions. COMMON BRAND NAME(S): Kadcyla What should I tell my health care provider before I take this medicine? They need to know if you have any of these conditions: -heart disease -heart failure -infection (especially a virus infection such as chickenpox, cold sores, or herpes) -liver disease -lung or breathing disease, like asthma -an unusual or allergic reaction to ado-trastuzumab emtansine, other medications, foods, dyes, or preservatives -pregnant or trying to get pregnant -breast-feeding How should I use this medicine? This medicine is for infusion into a vein. It is given by a health care professional in a hospital or clinic setting. Talk to your pediatrician regarding the use of this medicine in children. Special care may be needed. Overdosage: If you think you have taken too much of this medicine contact a poison control center or emergency room at once. NOTE: This medicine is only for you. Do not share this medicine with others. What if I miss a dose? It is important not to miss your dose. Call your doctor or health care professional if you are unable to keep an appointment. What may interact with this medicine? This medicine may also interact with the following medications: -atazanavir -boceprevir -clarithromycin -delavirdine -indinavir -dalfopristin; quinupristin -isoniazid, INH -itraconazole -ketoconazole -nefazodone -nelfinavir -ritonavir -telaprevir -telithromycin -tipranavir -voriconazole This list may not describe all possible interactions. Give your health care provider a list of all the medicines, herbs, non-prescription drugs, or dietary  supplements you use. Also tell them if you smoke, drink alcohol, or use illegal drugs. Some items may interact with your medicine. What should I watch for while using this medicine? Visit your doctor for checks on your progress. This drug may make you feel generally unwell. This is not uncommon, as chemotherapy can affect healthy cells as well as cancer cells. Report any side effects. Continue your course of treatment even though you feel ill unless your doctor tells you to stop. You may need blood work done while you are taking this medicine. Call your doctor or health care professional for advice if you get a fever, chills or sore throat, or other symptoms of a cold or flu. Do not treat yourself. This drug decreases your body's ability to fight infections. Try to avoid being around people who are sick. Be careful brushing and flossing your teeth or using a toothpick because you may get an infection or bleed more easily. If you have any dental work done, tell your dentist you are receiving this medicine. Avoid taking products that contain aspirin, acetaminophen, ibuprofen, naproxen, or ketoprofen unless instructed by your doctor. These medicines may hide a fever. Do not become pregnant while taking this medicine or for 7 months after stopping it, men with female partners should use contraception during treatment and for 4 months after the last dose. Women should inform their doctor if they wish to become pregnant or think they might be pregnant. There is a potential for serious side effects to an unborn child. Do not breast-feed an infant while taking this medicine or for 7 months after the last dose. Men who have a partner who is pregnant or who is capable of becoming pregnant should use a condom during sexual activity while taking this medicine and for   4 months after stopping it. Men should inform their doctors if they wish to father a child. This medicine may lower sperm counts. Talk to your health care  professional or pharmacist for more information. What side effects may I notice from receiving this medicine? Side effects that you should report to your doctor or health care professional as soon as possible: -allergic reactions like skin rash, itching or hives, swelling of the face, lips, or tongue -breathing problems -chest pain or palpitations -fever or chills, sore throat -general ill feeling or flu-like symptoms -light-colored stools -nausea, vomiting -pain, tingling, numbness in the hands or feet -signs and symptoms of bleeding such as bloody or black, tarry stools; red or dark-brown urine; spitting up blood or brown material that looks like coffee grounds; red spots on the skin; unusual bruising or bleeding from the eye, gums, or nose -swelling of the legs or ankles -yellowing of the eyes or skin Side effects that usually do not require medical attention (report to your doctor or health care professional if they continue or are bothersome): -changes in taste -constipation -dizziness -headache -joint pain -muscle pain -trouble sleeping -unusually weak or tired This list may not describe all possible side effects. Call your doctor for medical advice about side effects. You may report side effects to FDA at 1-800-FDA-1088. Where should I keep my medicine? This drug is given in a hospital or clinic and will not be stored at home. NOTE: This sheet is a summary. It may not cover all possible information. If you have questions about this medicine, talk to your doctor, pharmacist, or health care provider.  2019 Elsevier/Gold Standard (2015-07-26 12:11:06)  

## 2018-10-07 NOTE — Therapy (Signed)
[facility] [address] [place], [address] Phone: [phone]   Fax:  [phone]  Patient Details  Name: Denise [name] MRN: [id number] Date of Birth: 02/24/1976 Referring Provider:  No ref. provider found  Encounter Date: 10/07/2018  The patient has been contacted today in regards to telehealth services. The patient expressed an interest in participating in telehealth visits. Patient has been informed that an [facility] support representative will be reaching out to them to verify their insurance benefits and for scheduling      Blythe Stanford, PT DPT 10/07/2018, 2:22 PM  [facility] [address], [place], [address] Phone: [phone]   Fax:  [phone]

## 2018-10-14 NOTE — Therapy (Signed)
[facility] [address] [place], [address] Phone: [phone]   Fax:  [phone]  Patient Details  Name: Denise [name] MRN: [id number] Date of Birth: [birth year] Referring Provider:  No ref. provider found  Encounter Date: 10/14/2018   The [facility] outpatient clinics are closed at this time due to the COVID-19 epidemic. The patient was contacted in regards to their therapy services. The patient is in agreement that they are safe and consent to being on hold for therapy services until the [facility] outpatient facilities reopen. At which time, the patient will be contacted to schedule an appointment to resume therapy services.   Holding services secondary to insurance not covering treatment.   Blythe Stanford, PT DPT 10/14/2018, 11:27 AM  [facility] [address], [place], [address] Phone: [phone]   Fax:  [phone]

## 2018-10-15 ENCOUNTER — Encounter: Payer: Self-pay | Admitting: Hematology and Oncology

## 2018-10-23 NOTE — Progress Notes (Signed)
[facility]  [address] [place], [place] [address] Phone: [phone]  Fax: [phone]   Clinic Day:  10/24/2018   Referring physician: [name],*  Chief Complaint: Denise [name] is a 43 y.o. female with multi-focal Her2/neu + left breast cancer who is seen for assessment prior to cycle #6 Kadcyla  HPI: The patient was last seen in the medical oncology clinic on 10/01/2018. At that time, she felt good. She denied any breast concerns. She denied any visual changes. She stopped Effexor. Exam was stable. Bilirubin was 1.3.  She was premenopausal (estradiol 219.1, FSH 48.2).  She began tamoxifen.   She received cycle #5 Kadcyla on 10/04/2018.   She began menstruating again on 10/15/2018.  Last GYN appointment was approximately 08/2017.   During the interim, she has been doing well.  Her vision issues have resolved. She reports stiffness in her shoulders and back the second week following each treatment that lasts a few days and resolves.   She is doing well on tamoxifen. She denies any night sweats or hot flashes.   Her blood sugars under control.   Past Medical History:  Diagnosis Date   Abnormal breast biopsy    PASH, [name]   Breast cancer [facility]) 11/2017   left breast   Family history of breast cancer    Family history of ovarian cancer    Fibroadenoma of breast, left 2000, 2016   Genetic screening 08/26/2013   BRCA/BART negative/Myriad, CHEK2 POS 2017   Increased risk of breast cancer 2017   IBIS=47%   Left breast lump 06/18/2017   Fluid/drained [name]   Migraine    Monoallelic mutation of CHEK2 gene in female patient 2017   increased risk of breast and colon cancer   Personal history of chemotherapy    Personal history of radiation therapy     Past Surgical History:  Procedure Laterality Date   BREAST BIOPSY Bilateral 09/08/14   neg   BREAST BIOPSY Left 03/16/2015   Procedure: BREAST  BIOPSY WITH NEEDLE LOCALIZATION;  Surgeon: [name], MD;  Location: [facility] ORS;  Service: General;  Laterality: Left;   BREAST BIOPSY Left 11/2017   positive   BREAST CYST ASPIRATION Left 06/18/2017   cyst aspiration   BREAST EXCISIONAL BIOPSY Left 09/2014   BREAST EXCISIONAL BIOPSY Right    age 28's   BREAST RECONSTRUCTION WITH PLACEMENT OF TISSUE EXPANDER AND FLEX HD (ACELLULAR HYDRATED DERMIS) Left 05/20/2018   Procedure: BREAST RECONSTRUCTION WITH PLACEMENT OF TISSUE EXPANDER AND FLEX HD (ACELLULAR HYDRATED DERMIS);  Surgeon: [name], DO;  Location: [facility] ORS;  Service: Plastics;  Laterality: Left;   BREAST SURGERY Right March 2000   benign fibroadenoma   BREAST SURGERY Left 08/08/13   excision   BREAST SURGERY Left 09/23/14   excision   MASTECTOMY Left 05/20/2018   MASTECTOMY W/ SENTINEL NODE BIOPSY Left 05/20/2018   Procedure: MASTECTOMY WITH SENTINEL LYMPH NODE BIOPSY;  Surgeon: [name], MD;  Location: [facility] ORS;  Service: General;  Laterality: Left;   PORTACATH PLACEMENT Right 12/17/2017   Procedure: INSERTION PORT-A-CATH;  Surgeon: [name], MD;  Location: [facility] ORS;  Service: General;  Laterality: Right;    Family History  Problem Relation Age of Onset   Prostate cancer Father 33   Breast cancer Paternal Grandmother 43   Breast cancer Maternal Aunt 51   Ovarian cancer Maternal Aunt 70   Breast cancer Paternal Aunt 60  Pancreatic cancer Maternal Aunt 75    Social History:  reports that she has never smoked. She has never used smokeless tobacco. She reports current alcohol use. She reports that she does not use drugs. She does not smoke. She "occasionally" drinks alcohol. 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 Patient denies known exposures to radiation on toxins. Her husband's name is [name]. She has 2 daughters, [name] and [name] (ages 32 1/2 and 62).  Her husband's name is [name]. The patient is alone  today.  Allergies:  Allergies  Allergen Reactions   Steri-Strip Compound Benzoin [Benzoin Compound]     Steri-strip ,    Tape Other (See Comments)    Minor skin irritation     Current Medications: Current Outpatient Medications  Medication Sig Dispense Refill   acetaminophen (TYLENOL) 500 MG tablet Take 500 mg by mouth every 6 (six) hours as needed for moderate pain or headache.     diazepam (VALIUM) 2 MG tablet Take 1 tablet (2 mg total) by mouth every 6 (six) hours as needed for anxiety. 30 tablet 0   lidocaine-prilocaine (EMLA) cream Apply 1 application topically as needed (port access).     magnesium oxide (MAG-OX) 400 MG tablet Take 1 tablet (400 mg total) by mouth daily. (Patient taking differently: Take 400 mg by mouth 2 (two) times a week. ) 30 tablet 0   omeprazole (PRILOSEC OTC) 20 MG tablet Take 20 mg by mouth daily as needed (for acid reflux).     tamoxifen (NOLVADEX) 20 MG tablet Take 1 tablet (20 mg total) by mouth daily. 30 tablet 3   vitamin C (ASCORBIC ACID) 500 MG tablet Take 500 mg by mouth every other day.     HYDROcodone-acetaminophen (NORCO) 5-325 MG tablet Take 1 tablet by mouth every 8 (eight) hours as needed for moderate pain. (Patient not taking: Reported on 09/04/2018) 20 tablet 0   venlafaxine XR (EFFEXOR-XR) 37.5 MG 24 hr capsule TAKE 1 CAPSULE BY MOUTH ONCE DAILY WITH BREAKFAST (Patient not taking: No sig reported) 90 capsule 0   No current facility-administered medications for this visit.    Facility-Administered Medications Ordered in Other Visits  Medication Dose Route Frequency Provider Last Rate Last Dose   sodium chloride flush (NS) 0.9 % injection 10 mL  10 mL Intravenous PRN Lequita Asal, MD   10 mL at 04/17/18 0844    Review of Systems  Constitutional: Positive for weight loss (2 pounds). Negative for chills (no night sweats or hot flashes), diaphoresis, fever and malaise/fatigue.       Doing well.  HENT: Negative.  Negative  for congestion, ear pain, hearing loss, nosebleeds, sinus pain, sore throat and tinnitus.   Eyes: Negative.  Negative for blurred vision, double vision, photophobia, pain, discharge and redness.       No visual changes.  Respiratory: Negative.  Negative for cough, hemoptysis, sputum production and shortness of breath.   Cardiovascular: Negative.  Negative for chest pain, palpitations, orthopnea, leg swelling and PND.  Gastrointestinal: Negative.  Negative for abdominal pain, blood in stool, constipation, diarrhea, heartburn, melena, nausea and vomiting.  Genitourinary: Negative.  Negative for dysuria, frequency, hematuria and urgency.  Musculoskeletal: Positive for myalgias (2wks after treatment, resolved). Negative for back pain, falls and joint pain. Neck pain: "tight" 2 weeks after treatment.  Skin: Negative.  Negative for itching and rash.  Neurological: Negative.  Negative for dizziness, tremors, sensory change, speech change, focal weakness, weakness and headaches.  Endo/Heme/Allergies:  Does not bruise/bleed easily.       Diabetes- controlled.  Psychiatric/Behavioral: Negative.  Negative for depression and memory loss. The patient is not nervous/anxious and does not have insomnia.   All other systems reviewed and are negative.  Performance status (ECOG):  0  Physical Exam  Constitutional: She is oriented to person, place, and time. She appears well-developed and well-nourished. No distress.  HENT:  Head: Normocephalic and atraumatic.  Mouth/Throat: Oropharynx is clear and moist.  Short dark hair. Wearing mask.  Eyes: Pupils are equal, round, and reactive to light. Conjunctivae and EOM are normal. No scleral icterus.  Neck: Normal range of motion. Neck supple.  Cardiovascular: Normal rate, regular rhythm and normal heart sounds. Exam reveals no gallop and no friction rub.  No murmur heard. Pulmonary/Chest: Effort normal and breath sounds normal. No respiratory distress. She has no  wheezes. She has no rales.  Abdominal: Soft. Bowel sounds are normal. She exhibits no distension and no mass. There is no abdominal tenderness. There is no rebound and no guarding.  Musculoskeletal: Normal range of motion.        General: No tenderness or edema.  Lymphadenopathy:    She has no cervical adenopathy.    She has no axillary adenopathy.       Right: No supraclavicular adenopathy present.       Left: No supraclavicular adenopathy present.  Neurological: She is alert and oriented to person, place, and time.  Skin: Skin is warm and dry. She is not diaphoretic. No erythema. No pallor.  Psychiatric: She has a normal mood and affect. Her behavior is normal. Judgment and thought content normal.  Nursing note and vitals reviewed.   Infusion on 10/24/2018  Component Date Value Ref Range Status   Magnesium 10/24/2018 1.8  1.7 - 2.4 mg/dL Final   Performed at [facility], [address], [place], [place] [address]   Sodium 10/24/2018 135  135 - 145 mmol/L Final   Potassium 10/24/2018 3.6  3.5 - 5.1 mmol/L Final   Chloride 10/24/2018 102  98 - 111 mmol/L Final   CO2 10/24/2018 23  22 - 32 mmol/L Final   Glucose, Bld 10/24/2018 138* 70 - 99 mg/dL Final   BUN 10/24/2018 14  6 - 20 mg/dL Final   Creatinine, Ser 10/24/2018 0.74  0.44 - 1.00 mg/dL Final   Calcium 10/24/2018 9.0  8.9 - 10.3 mg/dL Final   Total Protein 10/24/2018 7.4  6.5 - 8.1 g/dL Final   Albumin 10/24/2018 3.9  3.5 - 5.0 g/dL Final   AST 10/24/2018 29  15 - 41 U/L Final   ALT 10/24/2018 20  0 - 44 U/L Final   Alkaline Phosphatase 10/24/2018 81  38 - 126 U/L Final   Total Bilirubin 10/24/2018 1.1  0.3 - 1.2 mg/dL Final   GFR calc non Af Amer 10/24/2018 >60  >60 mL/min Final   GFR calc Af Amer 10/24/2018 >60  >60 mL/min Final   Anion gap 10/24/2018 10  5 - 15 Final   Performed at [facility], [address] [place], [place] [address]   WBC 10/24/2018 5.3  4.0 - 10.5 K/uL  Final   RBC 10/24/2018 4.28  3.87 - 5.11 MIL/uL Final   Hemoglobin 10/24/2018 12.3  12.0 - 15.0 g/dL Final   HCT 10/24/2018 36.2  36.0 - 46.0 % Final   MCV 10/24/2018 84.6  80.0 - 100.0 fL Final   MCH 10/24/2018 28.7  26.0 -  34.0 pg Final   MCHC 10/24/2018 34.0  30.0 - 36.0 g/dL Final   RDW 10/24/2018 14.8  11.5 - 15.5 % Final   Platelets 10/24/2018 247  150 - 400 K/uL Final   nRBC 10/24/2018 0.0  0.0 - 0.2 % Final   Neutrophils Relative % 10/24/2018 67  % Final   Neutro Abs 10/24/2018 3.6  1.7 - 7.7 K/uL Final   Lymphocytes Relative 10/24/2018 23  % Final   Lymphs Abs 10/24/2018 1.2  0.7 - 4.0 K/uL Final   Monocytes Relative 10/24/2018 8  % Final   Monocytes Absolute 10/24/2018 0.4  0.1 - 1.0 K/uL Final   Eosinophils Relative 10/24/2018 1  % Final   Eosinophils Absolute 10/24/2018 0.1  0.0 - 0.5 K/uL Final   Basophils Relative 10/24/2018 1  % Final   Basophils Absolute 10/24/2018 0.0  0.0 - 0.1 K/uL Final   Immature Granulocytes 10/24/2018 0  % Final   Abs Immature Granulocytes 10/24/2018 0.01  0.00 - 0.07 K/uL Final   Performed at [facility], [address], [place], [place] [address]    Assessment:  Denise [name] is a 43 y.o. 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  Pathology revealed no residual carcinoma in the breast.  One of three sentinel lymph nodes were positive.  One lymph node had 2 metastatic foci (2.1 mm and 1 mm).  Pathologic stage revealed ypT0 ypN1a.  Index breast mass and axillary node biopsy on 12/06/2017 revealed grade II invasive ductal carcinoma with calcifications at the 11 o'clock position.  There was metastatic carcinoma in 1 of 1 lymph nodes.  Tumor was ER + (100%), PR + (100%), and Ki67 5%.  Her2/neu was 3+ by IHC (heterogeneous) and FISH +.  Diagnostic left mammogram and ultrasound on 12/06/2017  revealed a 1.7 x 1.3 x 1.3 cm irregular hypoechoic mass with internal calcifications at the 11 o'clock position 2 cm from the nipple with internal calcifications. There was a similar appearing 0.9 x 0.8 x 0.7 cm mass at the 11 o'clock position 4 cm from the nipple (2.4 cm from the index lesion).  There were suspicious, segmental calcifications originating from the index lesion and extending 5 cm posteriorly.  There was a 0.6 cm morphologically abnormal left axillary lymph node.  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 The adjacent and more superiorly located 11 o'clock mass (7 x 12 mm) seen on recent ultrasound, 4 cm from the nipple on ultrasound, was also identified. There were 2 probable satellite lesions (7 mm, medial lesion; posterior and lateral lesion). The total span of malignancy was 3 x 3.1 x 4.5 cm.  There were calcifications extending up to 5 cm posterior to the biopsied malignancy which were highly suspicious on mammography but not appreciated.  There was a mass in the lower outer left breast measured 5 mm, representing a change.  The known metastatic node in the left axilla was identified. A node along the posterolateral margin of the pectoralis minor (cortex 5 mm) was nonspecific but at least somewhat suspicious.  Chest, abdomen, and pelvic CT on 12/19/2017 revealed a solitary enlarged biopsy-proven metastatic left axillary lymph node.  There were no additional findings suspicious for metastatic disease in the chest, abdomen or pelvis.  There  was a nonspecific tiny sclerotic upper right sacral lesion, more likely a benign bone island. Bone scintigraphy correlation versus follow-up CT could be considered.  Bone scan on 01/11/2018 was negative for metastatic pattern. No areas of focal tracer uptake. Area of concern in sacrum on CT likely represents benign bone island.    Myriad genetic testing on 08/26/2013 was negative for BRCA1 and 2. 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  She has a family history significant for breast, ovarian, pancreatic, and prostate cancer.  She received 6 cycles of TCHP chemotherapy (12/21/2017 - 04/17/2018) with Margarette Canada support. Cycle #3 was held due to elevated LFTs on 02/04/2018. She received Herceptin + Perjeta alone (04/30/2018 - 05/30/2018).  She is s/p 5 cycles of Kadcyla (06/20/2018 - 07/11/2018; 08/20/2018- 10/04/2018).  Cycle #3 was held on 08/01/2018 due to visual changes (blurred vision).  She received breast radiation from 06/26/2018 - 08/05/2018.  Echocardiograms:  EF 60-65% on 12/20/2017, 60-65% on 03/27/2018, 55-60% on 06/18/2018, and 55-60% on 09/27/2018.  She has a history of elevated LFTs.  Hepatitis B and C serologies were negative on 02/04/2018.  RUQ ultrasound on 02/06/2018 revealed a small anterior gallbladder wall polyp. There was no evidence of cholelithiasis or cholecystis. CBD measured normal at 2 mm, with no evidence of choledocholithiasis. There was normal direction of blood flow towards the liver noted.  She has vasomotor symptoms.  She discontinued Effexor around 09/18/2018.  She was amenorrheic from 11/2017 - 10/15/2018.  Dwight and estradiol confirmed a premenopausal state on 10/01/2018.   Menses restarted on 10/15/2018.  Symptomatically, she is doing well.  She notes some neck and shoulder tightness 2 weeks after treatment.  She denies any visual issues.  Exam is unremarkable.  Plan: 1. Labs today:  CBC with diff, CMP, Mg. 2. Stage IB multifocal LEFT breast cancer Clinically, she is doing well. Echo on 09/27/2018 was normal. She notes minor muscloskeletal side effects associated with Kadcyla (incidence 36%). She is tolerating tamoxifen well. She is premenopausal on testing with resumption of menses on 10/15/2018.  Discuss follow-up with gynecology.  Discuss  contraception. Discuss use of goserelin and an aromatase inhibitor instead of tamoxifen.  Potential side effects reviewed. Cycle #6 Kadcycla today. 3. Vision changes             Patient with initial visual changes associated with Kadcyla.  No current visual changes.  Continue to monitor.  4. History of abnormal LFTs AST 29.  ALT 20.  Bilirubin 1.1. 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 Continue to monitor. 5. Vasomotor symptoms              patient denies hot flashes or night sweats. 6.   RTC in weeks for MD assessment, labs (CBC with diff, CMP, magnesium) and Kadcyla.  I discussed the assessment and treatment plan with the patient.  The patient was provided an opportunity to ask questions and all were answered.  The patient agreed with the plan and demonstrated an understanding of the instructions.  The patient was advised to call back if the symptoms worsen or if the condition fails to improve as anticipated.   Lequita Asal, MD, PhD    10/24/2018, 9:42 AM  I, Molly Dorshimer, am acting as Education administrator for [employer]. Mike Gip, MD, PhD.  I,  C. Mike Gip, MD, have reviewed the above documentation for accuracy and completeness, and I agree with the above.

## 2018-10-24 ENCOUNTER — Ambulatory Visit: Payer: BC Managed Care – PPO | Admitting: Physical Therapy

## 2018-10-24 ENCOUNTER — Other Ambulatory Visit: Payer: Self-pay

## 2018-10-24 ENCOUNTER — Encounter: Payer: Self-pay | Admitting: Hematology and Oncology

## 2018-10-24 ENCOUNTER — Encounter: Payer: Self-pay | Admitting: Physical Therapy

## 2018-10-24 ENCOUNTER — Inpatient Hospital Stay (HOSPITAL_BASED_OUTPATIENT_CLINIC_OR_DEPARTMENT_OTHER): Payer: BC Managed Care – PPO | Admitting: Hematology and Oncology

## 2018-10-24 ENCOUNTER — Inpatient Hospital Stay: Payer: BC Managed Care – PPO

## 2018-10-24 ENCOUNTER — Inpatient Hospital Stay: Payer: BC Managed Care – PPO | Attending: Hematology and Oncology

## 2018-10-24 VITALS — BP 141/90 | HR 93 | Temp 97.2°F | Resp 17

## 2018-10-24 VITALS — BP 119/83 | HR 83 | Temp 97.5°F | Resp 16 | Wt 183.5 lb

## 2018-10-24 DIAGNOSIS — M25512 Pain in left shoulder: Secondary | ICD-10-CM

## 2018-10-24 DIAGNOSIS — Z5112 Encounter for antineoplastic immunotherapy: Secondary | ICD-10-CM

## 2018-10-24 DIAGNOSIS — Z79810 Long term (current) use of selective estrogen receptor modulators (SERMs): Secondary | ICD-10-CM | POA: Diagnosis not present

## 2018-10-24 DIAGNOSIS — C773 Secondary and unspecified malignant neoplasm of axilla and upper limb lymph nodes: Secondary | ICD-10-CM

## 2018-10-24 DIAGNOSIS — Z170 Estrogen receptor positive status [ER+]: Secondary | ICD-10-CM

## 2018-10-24 DIAGNOSIS — C50512 Malignant neoplasm of lower-outer quadrant of left female breast: Secondary | ICD-10-CM | POA: Diagnosis present

## 2018-10-24 DIAGNOSIS — Z79899 Other long term (current) drug therapy: Secondary | ICD-10-CM | POA: Insufficient documentation

## 2018-10-24 DIAGNOSIS — C50412 Malignant neoplasm of upper-outer quadrant of left female breast: Secondary | ICD-10-CM

## 2018-10-24 DIAGNOSIS — C50419 Malignant neoplasm of upper-outer quadrant of unspecified female breast: Secondary | ICD-10-CM

## 2018-10-24 LAB — COMPREHENSIVE METABOLIC PANEL
ALT: 20 U/L (ref 0–44)
AST: 29 U/L (ref 15–41)
Albumin: 3.9 g/dL (ref 3.5–5.0)
Alkaline Phosphatase: 81 U/L (ref 38–126)
Anion gap: 10 (ref 5–15)
BUN: 14 mg/dL (ref 6–20)
CO2: 23 mmol/L (ref 22–32)
Calcium: 9 mg/dL (ref 8.9–10.3)
Chloride: 102 mmol/L (ref 98–111)
Creatinine, Ser: 0.74 mg/dL (ref 0.44–1.00)
GFR calc Af Amer: >60 mL/min (ref >60)
GFR calc non Af Amer: >60 mL/min (ref >60)
Glucose, Bld: 138 mg/dL — ABNORMAL HIGH (ref 70–99)
Potassium: 3.6 mmol/L (ref 3.5–5.1)
Sodium: 135 mmol/L (ref 135–145)
Total Bilirubin: 1.1 mg/dL (ref 0.3–1.2)
Total Protein: 7.4 g/dL (ref 6.5–8.1)

## 2018-10-24 LAB — CBC WITH DIFFERENTIAL/PLATELET
Abs Immature Granulocytes: 0.01 10*3/uL (ref 0.00–0.07)
Basophils Absolute: 0 10*3/uL (ref 0.0–0.1)
Basophils Relative: 1 %
Eosinophils Absolute: 0.1 10*3/uL (ref 0.0–0.5)
Eosinophils Relative: 1 %
HCT: 36.2 % (ref 36.0–46.0)
Hemoglobin: 12.3 g/dL (ref 12.0–15.0)
Immature Granulocytes: 0 %
Lymphocytes Relative: 23 %
Lymphs Abs: 1.2 10*3/uL (ref 0.7–4.0)
MCH: 28.7 pg (ref 26.0–34.0)
MCHC: 34 g/dL (ref 30.0–36.0)
MCV: 84.6 fL (ref 80.0–100.0)
Monocytes Absolute: 0.4 10*3/uL (ref 0.1–1.0)
Monocytes Relative: 8 %
Neutro Abs: 3.6 10*3/uL (ref 1.7–7.7)
Neutrophils Relative %: 67 %
Platelets: 247 10*3/uL (ref 150–400)
RBC: 4.28 MIL/uL (ref 3.87–5.11)
RDW: 14.8 % (ref 11.5–15.5)
WBC: 5.3 10*3/uL (ref 4.0–10.5)
nRBC: 0 % (ref 0.0–0.2)

## 2018-10-24 LAB — PREGNANCY, URINE: Preg Test, Ur: NEGATIVE

## 2018-10-24 LAB — MAGNESIUM: Magnesium: 1.8 mg/dL (ref 1.7–2.4)

## 2018-10-24 MED ORDER — HEPARIN SOD (PORK) LOCK FLUSH 100 UNIT/ML IV SOLN
500.0000 [IU] | Freq: Once | INTRAVENOUS | Status: AC | PRN
  Administered 2018-10-24: 11:00:00 500 [IU]
  Filled 2018-10-24: qty 5

## 2018-10-24 MED ORDER — ACETAMINOPHEN 325 MG PO TABS
650.0000 mg | ORAL_TABLET | Freq: Once | ORAL | Status: AC
  Administered 2018-10-24: 10:00:00 650 mg via ORAL
  Filled 2018-10-24: qty 2

## 2018-10-24 MED ORDER — SODIUM CHLORIDE 0.9% FLUSH
10.0000 mL | INTRAVENOUS | Status: DC | PRN
  Administered 2018-10-24: 09:00:00 10 mL
  Filled 2018-10-24: qty 10

## 2018-10-24 MED ORDER — SODIUM CHLORIDE 0.9 % IV SOLN
Freq: Once | INTRAVENOUS | Status: AC
  Administered 2018-10-24: 10:00:00 via INTRAVENOUS
  Filled 2018-10-24: qty 250

## 2018-10-24 MED ORDER — SODIUM CHLORIDE 0.9 % IV SOLN
3.6000 mg/kg | Freq: Once | INTRAVENOUS | Status: AC
  Administered 2018-10-24: 300 mg via INTRAVENOUS
  Filled 2018-10-24: qty 15

## 2018-10-24 MED ORDER — DIPHENHYDRAMINE HCL 25 MG PO CAPS
50.0000 mg | ORAL_CAPSULE | Freq: Once | ORAL | Status: AC
  Administered 2018-10-24: 10:00:00 50 mg via ORAL
  Filled 2018-10-24: qty 2

## 2018-10-24 NOTE — Progress Notes (Signed)
Spoke with Caren Griffins in the lab. Urine pregnancy is negative.

## 2018-10-24 NOTE — Progress Notes (Signed)
Pt here for follow up. Denies any concerns.  

## 2018-10-24 NOTE — Therapy (Signed)
[place] [facility] [address], [place], [address] Phone: [phone]   Fax:  [phone]  Physical Therapy Treatment  Patient Details  Name: Denise [name] MRN: [id number] Date of Birth: Oct 29, 1975 Referring Provider (PT): Dillingham   Encounter Date: 10/24/2018  PT End of Session - 10/24/18 1530    Visit Number  1    Number of Visits  17    Date for PT Re-Evaluation  12/18/18    PT Start Time  0238    PT Stop Time  0320    PT Time Calculation (min)  42 min    Activity Tolerance  Patient tolerated treatment well    Behavior During Therapy  [facility] for tasks assessed/performed       Past Medical History:  Diagnosis Date  . Abnormal breast biopsy    PASH, [name]  . Breast cancer ([place]) 11/2017   left breast  . Family history of breast cancer   . Family history of ovarian cancer   . Fibroadenoma of breast, left 2000, 2016  . Genetic screening 08/26/2013   BRCA/BART negative/Myriad, CHEK2 POS 2017  . Increased risk of breast cancer 2017   IBIS=47%  . Left breast lump 06/18/2017   Fluid/drained [name]  . Migraine   . Monoallelic mutation of CHEK2 gene in female patient 2017   increased risk of breast and colon cancer  . Personal history of chemotherapy   . Personal history of radiation therapy     Past Surgical History:  Procedure Laterality Date  . BREAST BIOPSY Bilateral 09/08/14   neg  . BREAST BIOPSY Left 03/16/2015   Procedure: BREAST BIOPSY WITH NEEDLE LOCALIZATION;  Surgeon: [name], MD;  Location: [facility] ORS;  Service: General;  Laterality: Left;  . BREAST BIOPSY Left 11/2017   positive  . BREAST CYST ASPIRATION Left 06/18/2017   cyst aspiration  . BREAST EXCISIONAL BIOPSY Left 09/2014  . BREAST EXCISIONAL BIOPSY Right    age 60's  . BREAST RECONSTRUCTION WITH PLACEMENT OF TISSUE EXPANDER AND FLEX HD (ACELLULAR HYDRATED DERMIS) Left 05/20/2018   Procedure: BREAST RECONSTRUCTION  WITH PLACEMENT OF TISSUE EXPANDER AND FLEX HD (ACELLULAR HYDRATED DERMIS);  Surgeon: [name], DO;  Location: [facility] ORS;  Service: Plastics;  Laterality: Left;  . BREAST SURGERY Right March 2000   benign fibroadenoma  . BREAST SURGERY Left 08/08/13   excision  . BREAST SURGERY Left 09/23/14   excision  . MASTECTOMY Left 05/20/2018  . MASTECTOMY W/ SENTINEL NODE BIOPSY Left 05/20/2018   Procedure: MASTECTOMY WITH SENTINEL LYMPH NODE BIOPSY;  Surgeon: [name], MD;  Location: [facility] ORS;  Service: General;  Laterality: Left;  . PORTACATH PLACEMENT Right 12/17/2017   Procedure: INSERTION PORT-A-CATH;  Surgeon: [name], MD;  Location: [facility] ORS;  Service: General;  Laterality: Right;    There were no vitals filed for this visit.  Subjective Assessment - 10/24/18 1442    Subjective  Patient has R masectomy scheduled 12/09/18. Patient reports she has not had a fill on L breast since March. Patient is not planning to have reconstruction on both breast following L masectomy and expansion. Patient reports over the past week she has started having some forearm and medial elbow pain, and ant chest/shoulder and bicep tension pain. Reports pain is 4/10 with motions away from the body.     Pertinent History  Patient is a 43 year old female with breast cancer history beginning  in 2016 with multiple benign lumpectomies. Patient reports first malignant finding this past June 2019 in L breast, with following masectomy with saline expander placed 05/20/18. Patient reports chemo prior with radiation beginning next week 5days/week, for 6 weeks. Patient reports she is also planning to have R masectomy with saline expansion in the future, unscheduled. 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 Patient reports she is a mother of a 2 year old and 90 year old that she reports are very involved in extra-curriculars. Patient reports her ROM in the shoulder is improving, but that   she is having some pain in the inside elbow and into medial forearm with straightening. Patient reports some TTP at incision site, but most all pain at elbow into forearm. Worst pain in past week 7/10 and best 0/10.  Reports pain is sharp in the medial elbow and forearm; denies numbness or tingling    Limitations  House hold activities;Lifting    How long can you sit comfortably?  unlimited    How long can you stand comfortably?  unlimited    How long can you walk comfortably?  unlimited    Patient Stated Goals  Decrease pain    Pain Onset  1 to 4 weeks ago       Manual STM with trigger point release  To L pec minor/major, latissimus/teres minor, bicep, pronator teres/wrist flexors, ant and middle deltoid PROM into all planes 100mn in flex, abd, IR, and ER inc ROM as able GHJ grade II 30sec bouts 6 bouts; inc to GIII 30sec bouts 6bouts, and with ER for 6 additional bouts, with abd 6 bouts, with flex 6 bouts Increased ROM throughout manual techniques and decreased pain noted from patient following.                          PT Education - 10/24/18 1529    Education Details  HEP review    Person(s) Educated  Patient    Methods  Explanation    Comprehension  Verbalized understanding       PT Short Term Goals - 06/21/18 1143      PT SHORT TERM GOAL #1   Title  Pt will be independent with HEP in order to improve strength and motion, and to decrease pain in order to improve pain-free function at home and work.    Time  4    Period  Weeks    Status  New        PT Long Term Goals - 10/24/18 1447      PT LONG TERM GOAL #1   Title  Pt will decrease worst pain as reported on NPRS by at least 3 points in order to demonstrate clinically significant reduction in pain.    Baseline  10/24/18 4/10    Time  8    Period  Weeks    Status  On-going      PT LONG TERM GOAL #2   Title  Patient will increase FOTO score to 72 to demonstrate predicted increase in functional  mobility to complete ADLs    Baseline  10/24/18     Time  8    Period  Weeks    Status  Revised      PT LONG TERM GOAL #3   Title  Patient will demonstrate all active, painfree shoulder ROM within normal limits in order to complete ADLs    Baseline  10/24/18  flex: 150d with "stretch pain" at axilla Abd: 138d with pain directly under breast; IR T10; ER C8    Time  8    Period  Weeks    Status  On-going            Plan - 10/24/18 1628    Clinical Impression Statement  Patient responded well to manual therapy with some discomfort initially. Patient demonstrates increased PROM and AROM of shoulder following. PT will continue postural restoration as able, and continue manual techniques for soft tissue restrictions as appropriate.     Rehab Potential  Good    Clinical Impairments Affecting Rehab Potential  (+) age, motivation, social support, (-) current sedentary lifestyle, ongoing cancer treatment, other comorbidities    PT Frequency  2x / week    PT Duration  8 weeks    PT Treatment/Interventions  ADLs/Self Care Home Management;Aquatic Therapy;Electrical Stimulation;Cryotherapy;Moist Heat;Iontophoresis 42m/ml Dexamethasone;Functional mobility training;Therapeutic activities;Therapeutic exercise;Traction;Ultrasound;Neuromuscular re-education;Manual techniques;Patient/family education;Manual lymph drainage;Passive range of motion;Dry needling;Energy conservation;Taping    PT Next Visit Plan  Ease soft tissue restrictions, increase ROM as able    PT Home Exercise Plan  pulleys shoulder abd/flex, sidelying lat stretch, supine ER stretch, scar massage    Consulted and Agree with Plan of Care  Patient       Patient will benefit from skilled therapeutic intervention in order to improve the following deficits and impairments:  Decreased activity tolerance, Decreased endurance, Decreased range of motion, Decreased skin integrity, Hypomobility, Increased fascial restricitons, Impaired UE functional  use, Improper body mechanics, Pain, Postural dysfunction, Impaired flexibility, Decreased scar mobility  Visit Diagnosis: Acute pain of left shoulder     Problem List Patient Active Problem List   Diagnosis Date Noted  . Acquired absence of left breast 09/03/2018  . Visual changes 08/20/2018  . Breast asymmetry following reconstructive surgery 08/13/2018  . Hot flashes due to menopause 06/18/2018  . Status post left mastectomy 06/04/2018  . S/P breast reconstruction 05/28/2018  . Vasomotor symptoms due to menopause 04/17/2018  . Elevated LFTs 03/17/2018  . Goals of care, counseling/discussion 03/17/2018  . Hypomagnesemia 03/04/2018  . Diarrhea 01/02/2018  . Encounter for antineoplastic chemotherapy 12/21/2017  . Encounter for antineoplastic immunotherapy 12/21/2017  . 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 Breast mass, right 12/05/2016  . Monoallelic mutation of CHEK2 gene in female patient 06/20/2015  . [place]ignant [place] of [place]outer [place] 09/17[place]2014   CShelton SilvasPT, DPT CShelton Silvas5/12/2018, 5:22 PM  [facility] [address] [place] [address]Phone: [phone]  Fax:  [phone] Name: EMAISHA BOGENMRN: [id number] of Birth: [birth year]

## 2018-10-25 ENCOUNTER — Inpatient Hospital Stay: Payer: BC Managed Care – PPO

## 2018-10-25 ENCOUNTER — Encounter: Payer: Self-pay | Admitting: Hematology and Oncology

## 2018-10-25 ENCOUNTER — Inpatient Hospital Stay: Payer: BC Managed Care – PPO | Admitting: Hematology and Oncology

## 2018-10-28 ENCOUNTER — Telehealth: Payer: Self-pay

## 2018-10-28 NOTE — Telephone Encounter (Signed)
Spoke with patient regarding blood in stools. Patient reports it has happened "maybe twice" since Friday (10/25/2018). Reports it is bright red blood and is unable to give exact amount due to it dispersing when it hits the water. Patient denies any pain.   Advised patient Dr. Mike Gip does not feel this is related to treatment, at this time. Would like patient to be evaluated by PCP to determine further advise.   Patient verbalizes understanding and reports she will talk to PCP.

## 2018-10-29 ENCOUNTER — Ambulatory Visit: Payer: BC Managed Care – PPO | Admitting: Physical Therapy

## 2018-10-29 ENCOUNTER — Other Ambulatory Visit: Payer: Self-pay | Admitting: General Surgery

## 2018-10-29 ENCOUNTER — Telehealth: Payer: Self-pay | Admitting: *Deleted

## 2018-10-29 ENCOUNTER — Encounter: Payer: Self-pay | Admitting: Physical Therapy

## 2018-10-29 ENCOUNTER — Other Ambulatory Visit: Payer: Self-pay

## 2018-10-29 DIAGNOSIS — M25512 Pain in left shoulder: Secondary | ICD-10-CM

## 2018-10-29 DIAGNOSIS — C50512 Malignant neoplasm of lower-outer quadrant of left female breast: Secondary | ICD-10-CM | POA: Diagnosis not present

## 2018-10-29 NOTE — Therapy (Signed)
[place] [facility] [address], [place], [address] Phone: [phone]   Fax:  [phone]  Physical Therapy Treatment  Patient Details  Name: Denise [name] MRN: [id number] Date of Birth: Aug 27, 1975 Referring Provider (PT): Dillingham   Encounter Date: 10/29/2018  PT End of Session - 10/29/18 0918    Visit Number  2    Number of Visits  17    Date for PT Re-Evaluation  12/18/18    PT Start Time  0830    PT Stop Time  0915    PT Time Calculation (min)  45 min    Activity Tolerance  Patient tolerated treatment well    Behavior During Therapy  [facility] : [facility] for tasks assessed/performed       Past Medical History:  Diagnosis Date  . Abnormal breast biopsy    PASH, [name]  . Breast cancer ([place]) 11/2017   left breast  . Family history of breast cancer   . Family history of ovarian cancer   . Fibroadenoma of breast, left 2000, 2016  . Genetic screening 08/26/2013   BRCA/BART negative/Myriad, CHEK2 POS 2017  . Increased risk of breast cancer 2017   IBIS=47%  . Left breast lump 06/18/2017   Fluid/drained [name]  . Migraine   . Monoallelic mutation of CHEK2 gene in female patient 2017   increased risk of breast and colon cancer  . Personal history of chemotherapy   . Personal history of radiation therapy     Past Surgical History:  Procedure Laterality Date  . BREAST BIOPSY Bilateral 09/08/14   neg  . BREAST BIOPSY Left 03/16/2015   Procedure: BREAST BIOPSY WITH NEEDLE LOCALIZATION;  Surgeon: [name], MD;  Location: [facility] ORS;  Service: General;  Laterality: Left;  . BREAST BIOPSY Left 11/2017   positive  . BREAST CYST ASPIRATION Left 06/18/2017   cyst aspiration  . BREAST EXCISIONAL BIOPSY Left 09/2014  . BREAST EXCISIONAL BIOPSY Right    age 59's  . BREAST RECONSTRUCTION WITH PLACEMENT OF TISSUE EXPANDER AND FLEX HD (ACELLULAR HYDRATED DERMIS) Left 05/20/2018   Procedure: BREAST RECONSTRUCTION  WITH PLACEMENT OF TISSUE EXPANDER AND FLEX HD (ACELLULAR HYDRATED DERMIS);  Surgeon: [name], DO;  Location: [facility] ORS;  Service: Plastics;  Laterality: Left;  . BREAST SURGERY Right March 2000   benign fibroadenoma  . BREAST SURGERY Left 08/08/13   excision  . BREAST SURGERY Left 09/23/14   excision  . MASTECTOMY Left 05/20/2018  . MASTECTOMY W/ SENTINEL NODE BIOPSY Left 05/20/2018   Procedure: MASTECTOMY WITH SENTINEL LYMPH NODE BIOPSY;  Surgeon: [name], MD;  Location: [facility] ORS;  Service: General;  Laterality: Left;  . PORTACATH PLACEMENT Right 12/17/2017   Procedure: INSERTION PORT-A-CATH;  Surgeon: [name], MD;  Location: [facility] ORS;  Service: General;  Laterality: Right;    There were no vitals filed for this visit.  Subjective Assessment - 10/29/18 0830    Subjective  Patient reports feeling better following last session with less pain in first 90d of flexion/abd with pain beginning after 3/10. Reports L UT tension/pain that comes on whenever she has infusion treatment, which she had last Thursday.     Pertinent History  Patient is a 43 year old female with breast cancer history beginning in 2016 with multiple benign lumpectomies. Patient reports first malignant finding this past June 2019 in L breast, with following masectomy with saline expander placed 05/20/18. Patient reports chemo prior  with radiation beginning next week 5days/week, for 6 weeks. Patient reports she is also planning to have R masectomy with saline expansion in the future, unscheduled. 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 Patient reports her ROM in the shoulder is improving, but that  she is having some pain in the inside elbow and into medial forearm with straightening. Patient reports some TTP at incision site, but most all pain at elbow into  forearm. Worst pain in past week 7/10 and best 0/10.  Reports pain is sharp in the medial elbow and forearm; denies numbness or tingling    Limitations  House hold activities;Lifting    How long can you sit comfortably?  unlimited    How long can you stand comfortably?  unlimited    Pain Onset  1 to 4 weeks ago          Manual STM with trigger point release  To L pec minor/major, latissimus/teres minor, UT, LESS FOCUS: bicep, pronator teres/wrist flexors, ant and middle deltoid PROM into all planes 9mn in flex, abd, IR, and ER inc ROM as able (focus on ER) GHJ AP grade III 30sec bouts 6bouts, and with ER for 6 additional bouts,  with flex 6 bouts Increased ROM throughout manual techniques and decreased pain noted from patient following. ER approx 45d, flex and abd approx 30d increase for full PROM and AROM in all planes following  Ther-Ex Review of current stretching in supine,and pulley AAROM with stretch; adding in doorway pec stretch in 90/90 position and sidelying latissimus stretch which patient is able to demonstrate properly. Educated patient on continue these to maintain available ROM                      PT Education - 10/29/18 0917    Education Details  HEP update    Person(s) Educated  Patient    Methods  Explanation;Demonstration;Verbal cues    Comprehension  Verbalized understanding;Returned demonstration;Verbal cues required       PT Short Term Goals - 06/21/18 1143      PT SHORT TERM GOAL #1   Title  Pt will be independent with HEP in order to improve strength and motion, and to decrease pain in order to improve pain-free function at home and work.    Time  4    Period  Weeks    Status  New        PT Long Term Goals - 10/24/18 1447      PT LONG TERM GOAL #1   Title  Pt will decrease worst pain as reported on NPRS by at least 3 points in order to demonstrate clinically significant reduction in pain.    Baseline  10/24/18 4/10    Time  8     Period  Weeks    Status  On-going      PT LONG TERM GOAL #2   Title  Patient will increase FOTO score to 72 to demonstrate predicted increase in functional mobility to complete ADLs    Baseline  10/24/18     Time  8    Period  Weeks    Status  Revised      PT LONG TERM GOAL #3   Title  Patient will demonstrate all active, painfree shoulder ROM within normal limits in order to  complete ADLs    Baseline  10/24/18 flex: 150d with "stretch pain" at axilla Abd: 138d with pain directly under breast; IR T10; ER C8    Time  8    Period  Weeks    Status  On-going            Plan - 10/29/18 1504    Clinical Impression Statement  Patient continued to respond well to manual therapy with full PROM and AROM, and no pain following. PT progressed HEP stretching to patient tolerance in new availible ROM, which patient is able to demonstrate good understanding of. Patient with good tolerance to treatment. Will continue as able.     Rehab Potential  Good    Clinical Impairments Affecting Rehab Potential  (+) age, motivation, social support, (-) current sedentary lifestyle, ongoing cancer treatment, other comorbidities    PT Treatment/Interventions  ADLs/Self Care Home Management;Aquatic Therapy;Electrical Stimulation;Cryotherapy;Moist Heat;Iontophoresis 52m/ml Dexamethasone;Functional mobility training;Therapeutic activities;Therapeutic exercise;Traction;Ultrasound;Neuromuscular re-education;Manual techniques;Patient/family education;Manual lymph drainage;Passive range of motion;Dry needling;Energy conservation;Taping    PT Next Visit Plan  Ease soft tissue restrictions, increase ROM as able    PT Home Exercise Plan  pulleys shoulder abd/flex, sidelying lat stretch, supine ER stretch, scar massage    Consulted and Agree with Plan of Care  Patient       Patient will benefit from skilled therapeutic intervention in order to improve the following deficits and impairments:  Decreased activity tolerance,  Decreased endurance, Decreased range of motion, Decreased skin integrity, Hypomobility, Increased fascial restricitons, Impaired UE functional use, Improper body mechanics, Pain, Postural dysfunction, Impaired flexibility, Decreased scar mobility  Visit Diagnosis: Acute pain of left shoulder     Problem List Patient Active Problem List   Diagnosis Date Noted  . Acquired absence of left breast 09/03/2018  . Visual changes 08/20/2018  . Breast asymmetry following reconstructive surgery 08/13/2018  . Hot flashes due to menopause 06/18/2018  . Status post left mastectomy 06/04/2018  . S/P breast reconstruction 05/28/2018  . Vasomotor symptoms due to menopause 04/17/2018  . Elevated LFTs 03/17/2018  . Goals of care, counseling/discussion 03/17/2018  . Hypomagnesemia 03/04/2018  . Diarrhea 01/02/2018  . Encounter for antineoplastic chemotherapy 12/21/2017  . Encounter for antineoplastic immunotherapy 12/21/2017  . 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 Breast mass, right 12/05/2016  . Monoallelic mutation of CHEK2 gene in female patient 06/20/2015  . Malignant [place] of [place]outer [place] of [place] 09/17[place]   CShelton SilvasPT, DPT CShelton Silvas5/05/2019, 10:24 AM  [facility] [address] [place] [address]Phone: [phone]  Fax:  [phone] Name: Denise RAWLINSONMRN: [id number] of Birth: [birth year]

## 2018-10-29 NOTE — Telephone Encounter (Signed)
Patient called the office back and was notified per previous message.   She verbalizes understanding.   A pre-op appointment has been scheduled with [name] for 12-03-18 at 2:15 pm.

## 2018-10-29 NOTE — Telephone Encounter (Signed)
Message left for patient to call the office.   Surgery scheduled with [name] and [name] on [facility]-09[facility].   Patient will have a phone interview on 12-04-18 between 9 am and 1 pm.   The patient will need to go for COVID testing on 12-05-18 between 10:30 and 12:30 pm at [facility] and isolate after.   Patient will need a pre-op visit with [name] prior to 12-04-18.

## 2018-10-31 ENCOUNTER — Other Ambulatory Visit: Payer: Self-pay

## 2018-10-31 ENCOUNTER — Encounter: Payer: Self-pay | Admitting: Physical Therapy

## 2018-10-31 ENCOUNTER — Ambulatory Visit: Payer: BC Managed Care – PPO | Admitting: Physical Therapy

## 2018-10-31 DIAGNOSIS — M25512 Pain in left shoulder: Secondary | ICD-10-CM

## 2018-10-31 DIAGNOSIS — C50512 Malignant neoplasm of lower-outer quadrant of left female breast: Secondary | ICD-10-CM | POA: Diagnosis not present

## 2018-10-31 NOTE — Therapy (Signed)
[facility] [address], [place], [address] Phone: [phone]   Fax:  [phone]  Physical Therapy Treatment  Patient Details  Name: Denise [name] MRN: [id number] Date of Birth: September 13, 1975 Referring Provider (PT): Dillingham   Encounter Date: 10/31/2018  PT End of Session - 10/31/18 0928    Visit Number  3    Number of Visits  17    Date for PT Re-Evaluation  12/18/18    PT Start Time  0830    PT Stop Time  0915    PT Time Calculation (min)  45 min    Activity Tolerance  Patient tolerated treatment well    Behavior During Therapy  [facility] for tasks assessed/performed       Past Medical History:  Diagnosis Date  . Abnormal breast biopsy    PASH, [name]  . Breast cancer ([place]) 11/2017   left breast  . Family history of breast cancer   . Family history of ovarian cancer   . Fibroadenoma of breast, left 2000, 2016  . Genetic screening 08/26/2013   BRCA/BART negative/Myriad, CHEK2 POS 2017  . Increased risk of breast cancer 2017   IBIS=47%  . Left breast lump 06/18/2017   Fluid/drained [name]  . Migraine   . Monoallelic mutation of CHEK2 gene in female patient 2017   increased risk of breast and colon cancer  . Personal history of chemotherapy   . Personal history of radiation therapy     Past Surgical History:  Procedure Laterality Date  . BREAST BIOPSY Bilateral 09/08/14   neg  . BREAST BIOPSY Left 03/16/2015   Procedure: BREAST BIOPSY WITH NEEDLE LOCALIZATION;  Surgeon: [name], MD;  Location: [facility] ORS;  Service: General;  Laterality: Left;  . BREAST BIOPSY Left 11/2017   positive  . BREAST CYST ASPIRATION Left 06/18/2017   cyst aspiration  . BREAST EXCISIONAL BIOPSY Left 09/2014  . BREAST EXCISIONAL BIOPSY Right    age 29's  . BREAST RECONSTRUCTION WITH PLACEMENT OF TISSUE EXPANDER AND FLEX HD (ACELLULAR HYDRATED DERMIS) Left 05/20/2018   Procedure: BREAST RECONSTRUCTION  WITH PLACEMENT OF TISSUE EXPANDER AND FLEX HD (ACELLULAR HYDRATED DERMIS);  Surgeon: [name], DO;  Location: [facility] ORS;  Service: Plastics;  Laterality: Left;  . BREAST SURGERY Right March 2000   benign fibroadenoma  . BREAST SURGERY Left 08/08/13   excision  . BREAST SURGERY Left 09/23/14   excision  . MASTECTOMY Left 05/20/2018  . MASTECTOMY W/ SENTINEL NODE BIOPSY Left 05/20/2018   Procedure: MASTECTOMY WITH SENTINEL LYMPH NODE BIOPSY;  Surgeon: [name], MD;  Location: [facility] ORS;  Service: General;  Laterality: Left;  . PORTACATH PLACEMENT Right 12/17/2017   Procedure: INSERTION PORT-A-CATH;  Surgeon: [name], MD;  Location: [facility] OR[facility];  Service: General;  Laterality: Right;    There were no vitals filed for this visit.  Subjective Assessment - 10/31/18 0834    Subjective  Patient reports she has been stretching and having her husband help her. Patient reports 3/10 pain with overhead motion with "pulling" in forearm and shoulder, with motion that is improving. Compliance with HEP.     Pertinent History  Patient is a 43 year old female with breast cancer history beginning in 2016 with multiple benign lumpectomies. Patient reports first malignant finding this past June 2019 in L breast, with following masectomy with saline expander placed 05/20/18. Patient reports chemo prior with radiation beginning next  week 5days/week, for 6 weeks. Patient reports she is also planning to have R masectomy with saline expansion in the future, unscheduled. 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 Patient reports she is a mother of a 22 year old and 69 year old that she reports are very involved in extra-curriculars. Patient reports her ROM in the shoulder is improving, but that  she is having some pain in the inside elbow and into medial forearm with straightening. Patient reports some TTP at incision site, but most all pain at elbow into forearm. Worst pain in  past week 7/10 and best 0/10.  Reports pain is sharp in the medial elbow and forearm; denies numbness or tingling    Limitations  House hold activities;Lifting    How long can you sit comfortably?  unlimited    How long can you stand comfortably?  unlimited    How long can you walk comfortably?  unlimited    Patient Stated Goals  Decrease pain          Manual STM withtrigger point releaseTo L pec minor/major, latissimus/teres minor, UT, bicep, pronator teres/wrist flexors, ant and middle deltoid; most focus on forearm musculature and pec minor PROM into all planes 15mn in flex, abd, IR, and ER inc ROM as able (focus on ER) GHJ AP grade III 30sec bouts 6bouts, and with ER for 6 additional bouts,  with flex 6 bouts Increased ROM throughout manual techniques and decreased pain noted from patient following.ER approx 45d, flex and abd approx 30d increase for full PROM and AROM in all planes following  Ther-Ex UE ranger clockwise x10 5 sec holds UE ranger counter clockwise x10 5 sec holds UE ranger flexion x5 5 sec holds UE ranger abduction x5 5 sec holds Wrist flexor stretch x30sec hold with education on how to complete with pulleys for stretch for shoulder flex + wrist flexor stretch into ext                     PT Education - 10/31/18 0928    Education Details  exercise form; HEP update    Person(s) Educated  Patient    Methods  Explanation;Demonstration;Verbal cues    Comprehension  Verbalized understanding;Returned demonstration;Verbal cues required       PT Short Term Goals - 06/21/18 1143      PT SHORT TERM GOAL #1   Title  Pt will be independent with HEP in order to improve strength and motion, and to decrease pain in order to improve pain-free function at home and work.    Time  4    Period  Weeks    Status  New        PT Long Term Goals - 10/24/18 1447      PT LONG TERM GOAL #1   Title  Pt will decrease worst pain as reported on NPRS by at  least 3 points in order to demonstrate clinically significant reduction in pain.    Baseline  10/24/18 4/10    Time  8    Period  Weeks    Status  On-going      PT LONG TERM GOAL #2   Title  Patient will increase FOTO score to 72 to demonstrate predicted increase in functional mobility to complete ADLs    Baseline  10/24/18     Time  8    Period  Weeks    Status  Revised  PT LONG TERM GOAL #3   Title  Patient will demonstrate all active, painfree shoulder ROM within normal limits in order to complete ADLs    Baseline  10/24/18 flex: 150d with "stretch pain" at axilla Abd: 138d with pain directly under breast; IR T10; ER C8    Time  8    Period  Weeks    Status  On-going            Plan - 10/31/18 0937    Clinical Impression Statement  Patient continues to respond well to manual techniques with near full AROM and no pain following. Patient is able to complete therex in aquired ROM with min cuing from PT for full ROM availible and proper form. PT will continue progression as able to restore normal UE ROM and function    Rehab Potential  Good    Clinical Impairments Affecting Rehab Potential  (+) age, motivation, social support, (-) current sedentary lifestyle, ongoing cancer treatment, other comorbidities    PT Frequency  2x / week    PT Duration  8 weeks    PT Treatment/Interventions  ADLs/Self Care Home Management;Aquatic Therapy;Electrical Stimulation;Cryotherapy;Moist Heat;Iontophoresis 76m/ml Dexamethasone;Functional mobility training;Therapeutic activities;Therapeutic exercise;Traction;Ultrasound;Neuromuscular re-education;Manual techniques;Patient/family education;Manual lymph drainage;Passive range of motion;Dry needling;Energy conservation;Taping    PT Next Visit Plan  Ease soft tissue restrictions, increase ROM as able    PT Home Exercise Plan  pulleys shoulder abd/flex, sidelying lat stretch, supine ER stretch, scar massage    Consulted and Agree with Plan of Care  Patient        Patient will benefit from skilled therapeutic intervention in order to improve the following deficits and impairments:     Visit Diagnosis: Acute pain of left shoulder     Problem List Patient Active Problem List   Diagnosis Date Noted  . Acquired absence of left breast 09/03/2018  . Visual changes 08/20/2018  . Breast asymmetry following reconstructive surgery 08/13/2018  . Hot flashes due to menopause 06/18/2018  . Status post left mastectomy 06/04/2018  . S/P breast reconstruction 05/28/2018  . Vasomotor symptoms due to menopause 04/17/2018  . Elevated LFTs 03/17/2018  . Goals of care, counseling/discussion 03/17/2018  . Hypomagnesemia 03/04/2018  . Diarrhea 01/02/2018  . Encounter for antineoplastic chemotherapy 12/21/2017  . Encounter for antineoplastic immunotherapy 12/21/2017  . 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 Breast mass, right 12/05/2016  . Monoallelic mutation of CHEK2 gene in female patient 06/20/2015  . Malignant neoplasm of upper-outer quadrant of female breast ([place] 09/17/2014   CShelton SilvasPT, DPT CShelton Silvas5/14/2020, 9:50 AM  [facility] [address] [place] [address]Phone: [phone]  Fax:  [phone] Name: Denise KOPSMRN: [id number] of Birth: 111/27/1977

## 2018-11-05 ENCOUNTER — Ambulatory Visit: Payer: BC Managed Care – PPO | Admitting: Physical Therapy

## 2018-11-05 ENCOUNTER — Encounter: Payer: Self-pay | Admitting: Physical Therapy

## 2018-11-05 ENCOUNTER — Other Ambulatory Visit: Payer: Self-pay

## 2018-11-05 DIAGNOSIS — C50512 Malignant neoplasm of lower-outer quadrant of left female breast: Secondary | ICD-10-CM | POA: Diagnosis not present

## 2018-11-05 DIAGNOSIS — M25512 Pain in left shoulder: Secondary | ICD-10-CM

## 2018-11-05 NOTE — Therapy (Signed)
[facility] [address], [place], [address] Phone: [phone]   Fax:  [phone]  Physical Therapy Treatment  Patient Details  Name: Denise [name] MRN: [id number] Date of Birth: Mar 28, 1976 Referring Provider (PT): Dillingham   Encounter Date: 11/05/2018  PT End of Session - 11/05/18 7048    Visit Number  4    Number of Visits  17    Date for PT Re-Evaluation  12/18/18    PT Start Time  0330    PT Stop Time  0420    PT Time Calculation (min)  50 min       Past Medical History:  Diagnosis Date  . Abnormal breast biopsy    PASH, [name]  . Breast cancer ([place]) 11/2017   left breast  . Family history of breast cancer   . Family history of ovarian cancer   . Fibroadenoma of breast, left 2000, 2016  . Genetic screening 08/26/2013   BRCA/BART negative/Myriad, CHEK2 POS 2017  . Increased risk of breast cancer 2017   IBIS=47%  . Left breast lump 06/18/2017   Fluid/drained [name]  . Migraine   . Monoallelic mutation of CHEK2 gene in female patient 2017   increased risk of breast and colon cancer  . Personal history of chemotherapy   . Personal history of radiation therapy     Past Surgical History:  Procedure Laterality Date  . BREAST BIOPSY Bilateral 09/08/14   neg  . BREAST BIOPSY Left 03/16/2015   Procedure: BREAST BIOPSY WITH NEEDLE LOCALIZATION;  Surgeon: Denise Bellow, MD;  Location: [facility];  Service: General;  Laterality: Left;  . BREAST BIOPSY Left 11/2017   positive  . BREAST CYST ASPIRATION Left 06/18/2017   cyst aspiration  . BREAST EXCISIONAL BIOPSY Left 09/2014  . BREAST EXCISIONAL BIOPSY Right    age 11's  . BREAST RECONSTRUCTION WITH PLACEMENT OF TISSUE EXPANDER AND FLEX HD (ACELLULAR HYDRATED DERMIS) Left 05/20/2018   Procedure: BREAST RECONSTRUCTION WITH PLACEMENT OF TISSUE EXPANDER AND FLEX HD (ACELLULAR HYDRATED DERMIS);  Surgeon: [name], DO;   Location: [facility];  Service: Plastics;  Laterality: Left;  . BREAST SURGERY Right March 2000   benign fibroadenoma  . BREAST SURGERY Left 08/08/13   excision  . BREAST SURGERY Left 09/23/14   excision  . MASTECTOMY Left 05/20/2018  . MASTECTOMY W/ SENTINEL NODE BIOPSY Left 05/20/2018   Procedure: MASTECTOMY WITH SENTINEL LYMPH NODE BIOPSY;  Surgeon: Denise Bellow, MD;  Location: [facility];  Service: General;  Laterality: Left;  . PORTACATH PLACEMENT Right 12/17/2017   Procedure: INSERTION PORT-A-CATH;  Surgeon: Denise Bellow, MD;  Location: [facility];  Service: General;  Laterality: Right;    There were no vitals filed for this visit.  Subjective Assessment - 11/05/18 1533    Subjective  Patient reports her forearm feels better than it has been, but that her bicep and UT feel very tight. Reports 2/10 pain today, reporting improved motion.     Pertinent History  Patient is a 43 year old female with breast cancer history beginning in 2016 with multiple benign lumpectomies. Patient reports first malignant finding this past June 2019 in L breast, with following masectomy with saline expander placed 05/20/18. Patient reports chemo prior with radiation beginning next week 5days/week, for 6 weeks. Patient reports she is also planning to have R masectomy with saline expansion in the future, unscheduled. 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 Patient reports she is a mother of a 71 year old and 37 year old that she reports are very involved in extra-curriculars. Patient reports her ROM in the shoulder is improving, but that  she is having some pain in the inside elbow and into medial forearm with straightening. Patient reports some TTP at incision site, but most all pain at elbow into forearm. Worst pain in past week 7/10 and best 0/10.  Reports pain is sharp in the medial elbow and forearm; denies numbness or tingling    Limitations  House hold activities;Lifting    How  long can you sit comfortably?  unlimited    How long can you stand comfortably?  unlimited    How long can you walk comfortably?  unlimited    Patient Stated Goals  Decrease pain    Pain Onset  1 to 4 weeks ago        Manual STM withtrigger point releaseTo L pec minor/major, latissimus/teres minor,UT,bicep, pronator teres/wrist flexors, ant and middle deltoid; less time spent on forearm musculature, most spent on bicep, UT, pec major/minor PROM into all planes77mn in flex, abd, IR, and ER inc ROM as able(focus on ER) GHJAPgrade III 30sec bouts 6bouts, and with ER for 6 additional bouts, with flex 6 bouts Increased ROM throughout manual techniques and decreased pain noted from patient following.ER approx 45d, flex and abd approx 30d increase for full PROM and AROM in all planes following  Ther-Ex UE ranger clockwise x10 5 sec holds UE ranger counter clockwise x10 5 sec holds Lat pulldown 25# x10 35# 2x 10 with min cuing for eccentric control with good carry over following  Tricep ext 15# 3x 8 with min cuing initially for setup with good form following                          PT Education - 11/05/18 1603    Education Details  Exercise form    Person(s) Educated  Patient    Methods  Explanation;Demonstration;Tactile cues;Verbal cues    Comprehension  Verbalized understanding;Returned demonstration;Verbal cues required;Tactile cues required       PT Short Term Goals - 06/21/18 1143      PT SHORT TERM GOAL #1   Title  Pt will be independent with HEP in order to improve strength and motion, and to decrease pain in order to improve pain-free function at home and work.    Time  4    Period  Weeks    Status  New        PT Long Term Goals - 10/24/18 1447      PT LONG TERM GOAL #1   Title  Pt will decrease worst pain as reported on NPRS by at least 3 points in order to demonstrate clinically significant reduction in pain.    Baseline  10/24/18 4/10     Time  8    Period  Weeks    Status  On-going      PT LONG TERM GOAL #2   Title  Patient will increase FOTO score to 72 to demonstrate predicted increase in functional mobility to complete ADLs    Baseline  10/24/18     Time  8    Period  Weeks    Status  Revised      PT LONG TERM GOAL #3   Title  Patient will demonstrate all active, painfree shoulder ROM within normal  limits in order to complete ADLs    Baseline  10/24/18 flex: 150d with "stretch pain" at axilla Abd: 138d with pain directly under breast; IR T10; ER C8    Time  8    Period  Weeks    Status  On-going            Plan - 11/06/18 1509    Clinical Impression Statement  Patient continues to respond well to manual theraepy with increased ROM and full AROM following allowing for therex progression for reciprocol inhibition muscle activation, and postural muscle strengthening to prevent mal-positioning of shoulders. Patient able to complete all therex with accuracy following demo and cuing    Rehab Potential  Good    Clinical Impairments Affecting Rehab Potential  (+) age, motivation, social support, (-) current sedentary lifestyle, ongoing cancer treatment, other comorbidities    PT Frequency  2x / week    PT Duration  8 weeks    PT Treatment/Interventions  ADLs/Self Care Home Management;Aquatic Therapy;Electrical Stimulation;Cryotherapy;Moist Heat;Iontophoresis 26m/ml Dexamethasone;Functional mobility training;Therapeutic activities;Therapeutic exercise;Traction;Ultrasound;Neuromuscular re-education;Manual techniques;Patient/family education;Manual lymph drainage;Passive range of motion;Dry needling;Energy conservation;Taping    PT Next Visit Plan  Ease soft tissue restrictions, increase ROM as able    PT Home Exercise Plan  pulleys shoulder abd/flex, sidelying lat stretch, supine ER stretch, scar massage    Consulted and Agree with Plan of Care  Patient       Patient will benefit from skilled therapeutic intervention in  order to improve the following deficits and impairments:  Decreased activity tolerance, Decreased endurance, Decreased range of motion, Decreased skin integrity, Hypomobility, Increased fascial restricitons, Impaired UE functional use, Improper body mechanics, Pain, Postural dysfunction, Impaired flexibility, Decreased scar mobility  Visit Diagnosis: Acute pain of left shoulder     Problem List Patient Active Problem List   Diagnosis Date Noted  . Acquired absence of left breast 09/03/2018  . Visual changes 08/20/2018  . Breast asymmetry following reconstructive surgery 08/13/2018  . Hot flashes due to menopause 06/18/2018  . Status post left mastectomy 06/04/2018  . S/P breast reconstruction 05/28/2018  . Vasomotor symptoms due to menopause 04/17/2018  . Elevated LFTs 03/17/2018  . Goals of care, counseling/discussion 03/17/2018  . Hypomagnesemia 03/04/2018  . Diarrhea 01/02/2018  . Encounter for antineoplastic chemotherapy 12/21/2017  . Encounter for antineoplastic immunotherapy 12/21/2017  . 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 Breast mass, right 12/05/2016  . Monoallelic mutation of CHEK2 gene in female patient 06/20/2015  . Malignant neoplasm of upper[place]outer quadrant of female breast ([place] 09/17/2014   CShelton SilvasPT, DPT CShelton Silvas5/20/2020, 3:12 PM  [facility] [address] [place] [address]Phone: [phone]  Fax:  [phone] Name: EMALVA DIESINGMRN: [id number] of Birth: 101/16/1977

## 2018-11-07 ENCOUNTER — Ambulatory Visit: Payer: BC Managed Care – PPO | Admitting: Physical Therapy

## 2018-11-12 ENCOUNTER — Encounter: Payer: Self-pay | Admitting: Physical Therapy

## 2018-11-12 ENCOUNTER — Ambulatory Visit: Payer: BC Managed Care – PPO | Admitting: Physical Therapy

## 2018-11-12 ENCOUNTER — Other Ambulatory Visit: Payer: Self-pay

## 2018-11-12 ENCOUNTER — Encounter: Payer: Self-pay | Admitting: Hematology and Oncology

## 2018-11-12 ENCOUNTER — Telehealth: Payer: Self-pay | Admitting: *Deleted

## 2018-11-12 DIAGNOSIS — M25512 Pain in left shoulder: Secondary | ICD-10-CM

## 2018-11-12 DIAGNOSIS — C50512 Malignant neoplasm of lower-outer quadrant of left female breast: Secondary | ICD-10-CM | POA: Diagnosis not present

## 2018-11-12 NOTE — Telephone Encounter (Signed)
  Please call the patient and obtain more specifics.  I think we have guidelines for this.   I believe Erline Levine has those guidelines.  M

## 2018-11-12 NOTE — Therapy (Signed)
[facility] [address], [place], [address] Phone: [phone]   Fax:  [phone]  Physical Therapy Treatment  Patient Details  Name: Denise [name] MRN: [id number] Date of Birth: 12-03-1975 Referring Provider (PT): Dillingham   Encounter Date: 11/12/2018  PT End of Session - 11/12/18 1614    Visit Number  5    Number of Visits  17    Date for PT Re-Evaluation  12/18/18    PT Start Time  0345    PT Stop Time  0425    PT Time Calculation (min)  40 min    Activity Tolerance  Patient tolerated treatment well    Behavior During Therapy  [facility] for tasks assessed/performed       Past Medical History:  Diagnosis Date  . Abnormal breast biopsy    PASH, [name]  . Breast cancer ([place]) 11/2017   left breast  . Family history of breast cancer   . Family history of ovarian cancer   . Fibroadenoma of breast, left 2000, 2016  . Genetic screening 08/26/2013   BRCA/BART negative/Myriad, CHEK2 POS 2017  . Increased risk of breast cancer 2017   IBIS=47%  . Left breast lump 06/18/2017   Fluid/drained [name]  . Migraine   . Monoallelic mutation of CHEK2 gene in female patient 2017   increased risk of breast and colon cancer  . Personal history of chemotherapy   . Personal history of radiation therapy     Past Surgical History:  Procedure Laterality Date  . BREAST BIOPSY Bilateral 09/08/14   neg  . BREAST BIOPSY Left 03/16/2015   Procedure: BREAST BIOPSY WITH NEEDLE LOCALIZATION;  Surgeon: [name], MD;  Location: [facility] ORS;  Service: General;  Laterality: Left;  . BREAST BIOPSY Left 11/2017   positive  . BREAST CYST ASPIRATION Left 06/18/2017   cyst aspiration  . BREAST EXCISIONAL BIOPSY Left 09/2014  . BREAST EXCISIONAL BIOPSY Right    age 89's  . BREAST RECONSTRUCTION WITH PLACEMENT OF TISSUE EXPANDER AND FLEX HD (ACELLULAR HYDRATED DERMIS) Left 05/20/2018   Procedure: BREAST RECONSTRUCTION  WITH PLACEMENT OF TISSUE EXPANDER AND FLEX HD (ACELLULAR HYDRATED DERMIS);  Surgeon: [name], DO;  Location: [facility] ORS;  Service: Plastics;  Laterality: Left;  . BREAST SURGERY Right March 2000   benign fibroadenoma  . BREAST SURGERY Left 08/08/13   excision  . BREAST SURGERY Left 09/23/14   excision  . MASTECTOMY Left 05/20/2018  . MASTECTOMY W/ SENTINEL NODE BIOPSY Left 05/20/2018   Procedure: MASTECTOMY WITH SENTINEL LYMPH NODE BIOPSY;  Surgeon: [name], MD;  Location: [facility] ORS;  Service: General;  Laterality: Left;  . PORTACATH PLACEMENT Right 12/17/2017   Procedure: INSERTION PORT-A-CATH;  Surgeon: [name], MD;  Location: [facility] OR[facility];  Service: General;  Laterality: Right;    There were no vitals filed for this visit.  Subjective Assessment - 11/12/18 1548    Subjective  Patient reports she is continuing to have some tension in forearm and ant shoulder at end range motion, but does think this is continuing to get better. Reports minimal pain, just tension at end range motion.     Pertinent History  Patient is a 43 year old female with breast cancer history beginning in 2016 with multiple benign lumpectomies. Patient reports first malignant finding this past June 2019 in L breast, with following masectomy with saline expander placed 05/20/18. Patient reports chemo prior with  radiation beginning next week 5days/week, for 6 weeks. Patient reports she is also planning to have R masectomy with saline expansion in the future, unscheduled. 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 Patient reports she is a mother of a 87 year old and 71 year old that she reports are very involved in extra-curriculars. Patient reports her ROM in the shoulder is improving, but that  she is having some pain in the inside elbow and into medial forearm with straightening. Patient reports some TTP at incision site, but most all pain at elbow into forearm. Worst pain in past  week 7/10 and best 0/10.  Reports pain is sharp in the medial elbow and forearm; denies numbness or tingling    Limitations  House hold activities;Lifting    How long can you sit comfortably?  unlimited    How long can you stand comfortably?  unlimited    How long can you walk comfortably?  unlimited    Patient Stated Goals  Decrease pain    Pain Onset  1 to 4 weeks ago          Manual STM withtrigger point releaseTo L pec minor/major, latissimus/teres minor,UT,bicep, pronator teres/wrist flexors, ant and middle deltoid; less time spent on forearm musculature, most spent on bicep, UT, pec major/minor PROM into all planes65mn in flex, abd, IR, and ER inc ROM as able(focus on ER) GHJAPgrade III 30sec bouts 6bouts, and with ER for 6 additional bouts, with flex 6 bouts Increased ROM throughout manual techniques and decreased pain noted from patient following.ER approx 45d, flex and abd approx 30d increase for full PROM and AROM in all planes following  Ther-Ex Lat pulldown 25# x10 35# 2x 10 with min cuing for eccentric control with good carry over following  Standing rows 15# x10 25# 2x 10 min cuing for shoulder retraction with good carry over Tricep ext 15# 3x 10 with good carry over from last session Standing bilat abd 2# DB with cuing initially for posture and symmetrical lift with good carry over folloiwng                      PT Education - 11/12/18 1614    Education Details  Exercise form    Person(s) Educated  Patient    Methods  Explanation;Demonstration;Verbal cues;Tactile cues    Comprehension  Verbalized understanding;Returned demonstration;Tactile cues required;Verbal cues required       PT Short Term Goals - 06/21/18 1143      PT SHORT TERM GOAL #1   Title  Pt will be independent with HEP in order to improve strength and motion, and to decrease pain in order to improve pain-free function at home and work.    Time  4    Period  Weeks     Status  New        PT Long Term Goals - 10/24/18 1447      PT LONG TERM GOAL #1   Title  Pt will decrease worst pain as reported on NPRS by at least 3 points in order to demonstrate clinically significant reduction in pain.    Baseline  10/24/18 4/10    Time  8    Period  Weeks    Status  On-going      PT LONG TERM GOAL #2   Title  Patient will increase FOTO score to 72 to demonstrate predicted increase in functional mobility to complete ADLs    Baseline  10/24/18     Time  8    Period  Weeks    Status  Revised      PT LONG TERM GOAL #3   Title  Patient will demonstrate all active, painfree shoulder ROM within normal limits in order to complete ADLs    Baseline  10/24/18 flex: 150d with "stretch pain" at axilla Abd: 138d with pain directly under breast; IR T10; ER C8    Time  8    Period  Weeks    Status  On-going            Plan - 11/12/18 1623    Clinical Impression Statement  Patient continues to respond well to manual techniques with good relief of trigger points and less tension with full ROM following. PT continued UE strengthening progression with mild deficit noted in LUE, but able to complete all complete all with good form and muscle fatigue.     Rehab Potential  Good    Clinical Impairments Affecting Rehab Potential  (+) age, motivation, social support, (-) current sedentary lifestyle, ongoing cancer treatment, other comorbidities    PT Frequency  2x / week    PT Duration  8 weeks    PT Treatment/Interventions  ADLs/Self Care Home Management;Aquatic Therapy;Electrical Stimulation;Cryotherapy;Moist Heat;Iontophoresis 65m/ml Dexamethasone;Functional mobility training;Therapeutic activities;Therapeutic exercise;Traction;Ultrasound;Neuromuscular re-education;Manual techniques;Patient/family education;Manual lymph drainage;Passive range of motion;Dry needling;Energy conservation;Taping    PT Next Visit Plan  Ease soft tissue restrictions, increase ROM as able    PT Home  Exercise Plan  pulleys shoulder abd/flex, sidelying lat stretch, supine ER stretch, scar massage    Consulted and Agree with Plan of Care  Patient       Patient will benefit from skilled therapeutic intervention in order to improve the following deficits and impairments:  Decreased activity tolerance, Decreased endurance, Decreased range of motion, Decreased skin integrity, Hypomobility, Increased fascial restricitons, Impaired UE functional use, Improper body mechanics, Pain, Postural dysfunction, Impaired flexibility, Decreased scar mobility  Visit Diagnosis: Acute pain of left shoulder     Problem List Patient Active Problem List   Diagnosis Date Noted  . Acquired absence of left breast 09/03/2018  . Visual changes 08/20/2018  . Breast asymmetry following reconstructive surgery 08/13/2018  . Hot flashes due to menopause 06/18/2018  . Status post left mastectomy 06/04/2018  . S/P breast reconstruction 05/28/2018  . Vasomotor symptoms due to menopause 04/17/2018  . Elevated LFTs 03/17/2018  . Goals of care, counseling/discussion 03/17/2018  . Hypomagnesemia 03/04/2018  . Diarrhea 01/02/2018  . Encounter for antineoplastic chemotherapy 12/21/2017  . Encounter for antineoplastic immunotherapy 12/21/2017  . 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 Breast mass, right 12/05/2016  . Monoallelic mutation of CHEK2 gene in female patient 06/20/2015  . Malignant ne[place] of upper-outer quadrant of [place] breast [place] 09/17/2014   CShelton SilvasPT, DPT CShelton Silvas5/26/2020, 4:35 PM  [facility] [address] [place] [address]Phone: [phone]  Fax:  [phone] Name: Denise BODLEYMRN: [id number] of Birth: 107/17/1977

## 2018-11-12 NOTE — Telephone Encounter (Signed)
Patient called reporting that her daughter went to Orthodontist and was potentially exposed to an employee testing positive for COVID 19. She is asking what this means in relation to her appts for infusion. Please return her call [phone]

## 2018-11-13 ENCOUNTER — Telehealth: Payer: Self-pay

## 2018-11-13 NOTE — Telephone Encounter (Signed)
Spoke with patient regarding possible COVID exposure. Advised that Dr. Mike Gip feels it would be better if we wait two weeks after possible exposure. Patient states happened on 11/06/2018. Appt. To be rescheduled to 11/21/18. Patient verbalizes understanding and is agreeable.

## 2018-11-14 ENCOUNTER — Other Ambulatory Visit: Payer: BC Managed Care – PPO

## 2018-11-14 ENCOUNTER — Other Ambulatory Visit: Payer: Self-pay

## 2018-11-14 ENCOUNTER — Ambulatory Visit: Payer: BC Managed Care – PPO | Admitting: Physical Therapy

## 2018-11-14 ENCOUNTER — Ambulatory Visit: Payer: BC Managed Care – PPO

## 2018-11-14 ENCOUNTER — Encounter: Payer: Self-pay | Admitting: Hematology and Oncology

## 2018-11-14 ENCOUNTER — Encounter: Payer: Self-pay | Admitting: Physical Therapy

## 2018-11-14 ENCOUNTER — Ambulatory Visit: Payer: BC Managed Care – PPO | Admitting: Hematology and Oncology

## 2018-11-14 DIAGNOSIS — C50512 Malignant neoplasm of lower-outer quadrant of left female breast: Secondary | ICD-10-CM | POA: Diagnosis not present

## 2018-11-14 DIAGNOSIS — M25512 Pain in left shoulder: Secondary | ICD-10-CM

## 2018-11-14 NOTE — Therapy (Signed)
[facility] [address] [place], [address] Phone: [phone]   Fax:  [phone]  Physical Therapy Treatment  Patient Details  Name: Denise [name] MRN: [id number] Date of Birth: Apr 11, 1976 Referring Provider (PT): Dillingham   Encounter Date: 11/14/2018  PT End of Session - 11/14/18 9892    Visit Number  6    Number of Visits  17    Date for PT Re-Evaluation  12/18/18    PT Start Time  0330    PT Stop Time  0415    PT Time Calculation (min)  45 min    Activity Tolerance  Patient tolerated treatment well    Behavior During Therapy  [facility] for tasks assessed/performed       Past Medical History:  Diagnosis Date  . Abnormal breast biopsy    PASH, [name]  . Breast cancer ([place]) 11/2017   left breast  . Family history of breast cancer   . Family history of ovarian cancer   . Fibroadenoma of breast, left 2000, 2016  . Genetic screening 08/26/2013   BRCA/BART negative/Myriad, CHEK2 POS 2017  . Increased risk of breast cancer 2017   IBIS=47%  . Left breast lump 06/18/2017   Fluid/drained [name]  . Migraine   . Monoallelic mutation of CHEK2 gene in female patient 2017   increased risk of breast and colon cancer  . Personal history of chemotherapy   . Personal history of radiation therapy     Past Surgical History:  Procedure Laterality Date  . BREAST BIOPSY Bilateral 09/08/14   neg  . BREAST BIOPSY Left 03/16/2015   Procedure: BREAST BIOPSY WITH NEEDLE LOCALIZATION;  Surgeon: [name], MD;  Location: [facility];  Service: General;  Laterality: Left;  . BREAST BIOPSY Left 11/2017   positive  . BREAST CYST ASPIRATION Left 06/18/2017   cyst aspiration  . BREAST EXCISIONAL BIOPSY Left 09/2014  . BREAST EXCISIONAL BIOPSY Right    age 34's  . BREAST RECONSTRUCTION WITH PLACEMENT OF TISSUE EXPANDER AND FLEX HD (ACELLULAR HYDRATED DERMIS) Left 05/20/2018   Procedure: BREAST RECONSTRUCTION  WITH PLACEMENT OF TISSUE EXPANDER AND FLEX HD (ACELLULAR HYDRATED DERMIS);  Surgeon: [name], DO;  Location: [facility] OR[facility];  Service: Plastics;  Laterality: Left;  . BREAST SURGERY Right March 2000   benign fibroadenoma  . BREAST SURGERY Left 08/08/13   excision  . BREAST SURGERY Left 09/23/14   excision  . MASTECTOMY Left 05/20/2018  . MASTECTOMY W/ SENTINEL NODE BIOPSY Left 05/20/2018   Procedure: MASTECTOMY WITH SENTINEL LYMPH NODE BIOPSY;  Surgeon: [name], MD;  Location: [facility];  Service: General;  Laterality: Left;  . PORTACATH PLACEMENT Right 12/17/2017   Procedure: INSERTION PORT-A-CATH;  Surgeon: [name], MD;  Location: [facility];  Service: General;  Laterality: Right;    There were no vitals filed for this visit.  Subjective Assessment - 11/14/18 1532    Subjective  Continues to report minimal pain, reports some tension at the lat at end range, 1/10 pain. Compliance with HEP    Pertinent History  Patient is a 43 year old female with breast cancer history beginning in 2016 with multiple benign lumpectomies. Patient reports first malignant finding this past June 2019 in L breast, with following masectomy with saline expander placed 05/20/18. Patient reports chemo prior with radiation beginning next week 5days/week, for 6 weeks. Patient reports she is also planning to have R masectomy  with saline expansion in the future, unscheduled. 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 Patient reports her ROM in the shoulder is improving, but that  she is having some pain in the inside elbow and into medial forearm with straightening. Patient reports some TTP at incision site, but most all pain at elbow into forearm. Worst pain in past week 7/10 and best 0/10.  Reports pain is sharp in the medial elbow and forearm; denies numbness or  tingling    Limitations  House hold activities;Lifting    How long can you sit comfortably?  unlimited    How long can you stand comfortably?  unlimited    How long can you walk comfortably?  unlimited    Patient Stated Goals  Decrease pain    Pain Onset  1 to 4 weeks ago          Manual STM withtrigger point releaseTo L pec minor/major, latissimus/teres minor,UT,bicep, pronator teres/wrist flexors, ant and middle deltoid; less time spent on forearm musculature, most spent on bicep, UT, pec major/minor PROM into all planes85mn in flex, abd, IR, and ER inc ROM as able(focus on ER) GHJAPgrade III 30sec bouts 6bouts, and with ER for 6 additional bouts, with flex 6 bouts Increased ROM throughout manual techniques and decreased pain noted from patient following.ER approx 45d, flex and abd approx 30d increase for full PROM and AROM in all planes following  Ther-Ex - Prone snow angel 3x 10 with cuing initially for scapular contraction maintained throughout; increased difficulty maintaining UE elevation 120-170d - Supine row 10# 3x 10 wit min cuing for scapular retraction with good carry over following - Prone T's with UE off edge of table 3x 10 1# with min cuing for eccentric control with good carry over - Prone Y attempted , unable to complete without shoulder hiking, discontinued - Lat pulldown 35# 3x 10 with good carry over from previous session                       PT Education - 11/14/18 1554    Education Details  Exercise form    Person(s) Educated  Patient    Methods  Explanation;Demonstration;Tactile cues;Verbal cues    Comprehension  Verbalized understanding;Returned demonstration;Verbal cues required;Tactile cues required       PT Short Term Goals - 06/21/18 1143      PT SHORT TERM GOAL #1   Title  Pt will be independent with HEP in order to improve strength and motion, and to decrease pain in order to improve pain-free function at home and  work.    Time  4    Period  Weeks    Status  New        PT Long Term Goals - 10/24/18 1447      PT LONG TERM GOAL #1   Title  Pt will decrease worst pain as reported on NPRS by at least 3 points in order to demonstrate clinically significant reduction in pain.    Baseline  10/24/18 4/10    Time  8    Period  Weeks    Status  On-going      PT LONG TERM GOAL #2   Title  Patient will increase FOTO score to 72 to demonstrate predicted increase in functional mobility to complete ADLs  Baseline  10/24/18     Time  8    Period  Weeks    Status  Revised      PT LONG TERM GOAL #3   Title  Patient will demonstrate all active, painfree shoulder ROM within normal limits in order to complete ADLs    Baseline  10/24/18 flex: 150d with "stretch pain" at axilla Abd: 138d with pain directly under breast; IR T10; ER C8    Time  8    Period  Weeks    Status  On-going            Plan - 11/14/18 1618    Clinical Impression Statement  Patient continues to respond well to manual techniques with less tension locations this session. Patient is continuing to progress therex well with some cuing needed for proper technique/form with good carry over following    Rehab Potential  Good    Clinical Impairments Affecting Rehab Potential  (+) age, motivation, social support, (-) current sedentary lifestyle, ongoing cancer treatment, other comorbidities    PT Frequency  2x / week    PT Duration  8 weeks    PT Treatment/Interventions  ADLs/Self Care Home Management;Aquatic Therapy;Electrical Stimulation;Cryotherapy;Moist Heat;Iontophoresis 91m/ml Dexamethasone;Functional mobility training;Therapeutic activities;Therapeutic exercise;Traction;Ultrasound;Neuromuscular re-education;Manual techniques;Patient/family education;Manual lymph drainage;Passive range of motion;Dry needling;Energy conservation;Taping    PT Next Visit Plan  Ease soft tissue restrictions, increase ROM as able    PT Home Exercise Plan   pulleys shoulder abd/flex, sidelying lat stretch, supine ER stretch, scar massage    Consulted and Agree with Plan of Care  Patient       Patient will benefit from skilled therapeutic intervention in order to improve the following deficits and impairments:  Decreased activity tolerance, Decreased endurance, Decreased range of motion, Decreased skin integrity, Hypomobility, Increased fascial restricitons, Impaired UE functional use, Improper body mechanics, Pain, Postural dysfunction, Impaired flexibility, Decreased scar mobility  Visit Diagnosis: Acute pain of left shoulder     Problem List Patient Active Problem List   Diagnosis Date Noted  . Acquired absence of left breast 09/03/2018  . Visual changes 08/20/2018  . Breast asymmetry following reconstructive surgery 08/13/2018  . Hot flashes due to menopause 06/18/2018  . Status post left mastectomy 06/04/2018  . S/P breast reconstruction 05/28/2018  . Vasomotor symptoms due to menopause 04/17/2018  . Elevated LFTs 03/17/2018  . Goals of care, counseling/discussion 03/17/2018  . Hypomagnesemia 03/04/2018  . Diarrhea 01/02/2018  . Encounter for antineoplastic chemotherapy 12/21/2017  . Encounter for antineoplastic immunotherapy 12/21/2017  . 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 Breast mass, right 12/05/2016  . Monoallelic mutation of CHEK2 gene in female patient 06/20/2015  . [place]ignant [place] of [place] of [place] 09[place]17[place]   CShelton SilvasPT, DPT CShelton Silvas5/28/2020, 4:27 PM  [facility] [address] [place] [address]Phone: [phone]  Fax:  [phone] Name: Denise PICKINGMRN: [id number] of Birth: 1Mar 23, 1977

## 2018-11-19 ENCOUNTER — Other Ambulatory Visit: Payer: Self-pay

## 2018-11-19 ENCOUNTER — Ambulatory Visit: Payer: BC Managed Care – PPO | Attending: Plastic Surgery | Admitting: Physical Therapy

## 2018-11-19 ENCOUNTER — Encounter: Payer: Self-pay | Admitting: Physical Therapy

## 2018-11-19 DIAGNOSIS — M25512 Pain in left shoulder: Secondary | ICD-10-CM | POA: Insufficient documentation

## 2018-11-19 NOTE — Therapy (Signed)
[place] [facility] [address], [place], [address] Phone: [phone]   Fax:  [phone]  Physical Therapy Treatment  Patient Details  Name: Denise [name] MRN: [id number] Date of Birth: 02-12-1976 Referring Provider (PT): Dillingham   Encounter Date: 11/19/2018  PT End of Session - 11/19/18 1834    Visit Number  7    Number of Visits  17    Date for PT Re-Evaluation  12/18/18    PT Start Time  0530    PT Stop Time  0615    PT Time Calculation (min)  45 min    Activity Tolerance  Patient tolerated treatment well    Behavior During Therapy  [facility] for tasks assessed/performed       Past Medical History:  Diagnosis Date  . Abnormal breast biopsy    PASH, [name]  . Breast cancer ([place]) 11/2017   left breast  . Family history of breast cancer   . Family history of ovarian cancer   . Fibroadenoma of breast, left 2000, 2016  . Genetic screening 08/26/2013   BRCA/BART negative/Myriad, CHEK2 POS 2017  . Increased risk of breast cancer 2017   IBIS=47%  . Left breast lump 06/18/2017   Fluid/drained [name]  . Migraine   . Monoallelic mutation of CHEK2 gene in female patient 2017   increased risk of breast and colon cancer  . Personal history of chemotherapy   . Personal history of radiation therapy     Past Surgical History:  Procedure Laterality Date  . BREAST BIOPSY Bilateral 09/08/14   neg  . BREAST BIOPSY Left 03/16/2015   Procedure: BREAST BIOPSY WITH NEEDLE LOCALIZATION;  Surgeon: [name], MD;  Location: [facility] ORS;  Service: General;  Laterality: Left;  . BREAST BIOPSY Left 11/2017   positive  . BREAST CYST ASPIRATION Left 06/18/2017   cyst aspiration  . BREAST EXCISIONAL BIOPSY Left 09/2014  . BREAST EXCISIONAL BIOPSY Right    age 22's  . BREAST RECONSTRUCTION WITH PLACEMENT OF TISSUE EXPANDER AND FLEX HD (ACELLULAR HYDRATED DERMIS) Left 05/20/2018   Procedure: BREAST RECONSTRUCTION  WITH PLACEMENT OF TISSUE EXPANDER AND FLEX HD (ACELLULAR HYDRATED DERMIS);  Surgeon: [name], DO;  Location: [facility] ORS;  Service: Plastics;  Laterality: Left;  . BREAST SURGERY Right March 2000   benign fibroadenoma  . BREAST SURGERY Left 08/08/13   excision  . BREAST SURGERY Left 09/23/14   excision  . MASTECTOMY Left 05/20/2018  . MASTECTOMY W/ SENTINEL NODE BIOPSY Left 05/20/2018   Procedure: MASTECTOMY WITH SENTINEL LYMPH NODE BIOPSY;  Surgeon: [name], MD;  Location: [facility] ORS;  Service: General;  Laterality: Left;  . PORTACATH PLACEMENT Right 12/17/2017   Procedure: INSERTION PORT-A-CATH;  Surgeon: [name], MD;  Location: [facility] ORS;  Service: General;  Laterality: Right;    There were no vitals filed for this visit.  Subjective Assessment - 11/19/18 1733    Subjective  Reports tension at latissimus which is uncomfortable. Reports her forearm feels much better, with pulling in the front of the cheset.     Pertinent History  Patient is a 43 year old female with breast cancer history beginning in 2016 with multiple benign lumpectomies. Patient reports first malignant finding this past June 2019 in L breast, with following masectomy with saline expander placed 05/20/18. Patient reports chemo prior with radiation beginning next week 5days/week, for 6 weeks. Patient reports she is also planning to  have R masectomy with saline expansion in the future, unscheduled. 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 Patient reports she is a mother of a 66 year old and 32 year old that she reports are very involved in extra-curriculars. Patient reports her ROM in the shoulder is improving, but that  she is having some pain in the inside elbow and into medial forearm with straightening. Patient reports some TTP at incision site, but most all pain at elbow into forearm. Worst pain in past week 7/10 and best 0/10.  Reports pain is sharp in the medial elbow and  forearm; denies numbness or tingling    Limitations  House hold activities;Lifting    How long can you sit comfortably?  unlimited    How long can you stand comfortably?  unlimited    How long can you walk comfortably?  unlimited    Patient Stated Goals  Decrease pain    Pain Onset  1 to 4 weeks ago            Manual STM withtrigger point releaseTo L pec minor/major, latissimus/teres minor,UT,bicep, pronator teres/wrist flexors, ant and middle deltoid; less time spent on forearm musculature, most spent on bicep, UT, pec major/minor PROM into all planes63mn in flex, abd, IR, and ER inc ROM as able(focus on ER) GHJAPgrade III 30sec bouts 6bouts, and with ER for 6 additional bouts, with flex 6 bouts Increased ROM throughout manual techniques and decreased pain noted from patient following.ER approx 45d, flex and abd approx 30d increase for full PROM and AROM in all planes following  Ther-Ex - Supine pec stretch on half foam roll 2x 45sec - Standing rows 25# 3x 10 with good carry over from previous session - High rows 20# 3x 10 with min cuing initially to prevent shoulder hiking with good carry over following -  Low rows BTB 3x 10, min cuing to prevent shoulder hiking, good carry over - Prone T's with UE off edge of table 1# 3x 10 with min cuing for eccentric control with good carry over - Standing Y 5# DB (attempted with 3#, not challenging); TC for initial set up with good carry over following                     PT Education - 11/19/18 1752    Education Details  Exercise form    Person(s) Educated  Patient    Methods  Explanation;Demonstration;Tactile cues;Verbal cues    Comprehension  Verbalized understanding;Returned demonstration;Verbal cues required;Tactile cues required       PT Short Term Goals - 06/21/18 1143      PT SHORT TERM GOAL #1   Title  Pt will be independent with HEP in order to improve strength and motion, and to decrease pain in  order to improve pain-free function at home and work.    Time  4    Period  Weeks    Status  New        PT Long Term Goals - 10/24/18 1447      PT LONG TERM GOAL #1   Title  Pt will decrease worst pain as reported on NPRS by at least 3 points in order to demonstrate clinically significant reduction in pain.    Baseline  10/24/18 4/10    Time  8    Period  Weeks    Status  On-going      PT LONG TERM GOAL #2   Title  Patient will increase FOTO score to 72 to demonstrate predicted increase in functional mobility to complete ADLs    Baseline  10/24/18     Time  8    Period  Weeks    Status  Revised      PT LONG TERM GOAL #3   Title  Patient will demonstrate all active, painfree shoulder ROM within normal limits in order to complete ADLs    Baseline  10/24/18 flex: 150d with "stretch pain" at axilla Abd: 138d with pain directly under breast; IR T10; ER C8    Time  8    Period  Weeks    Status  On-going            Plan - 11/20/18 0905    Clinical Impression Statement  Patient continues to respond well to manual and stretching techniques with less pain and tension following. Patient is able to complete therex progression without increase in pain, and complies with all cuing for proper form.     Rehab Potential  Good    Clinical Impairments Affecting Rehab Potential  (+) age, motivation, social support, (-) current sedentary lifestyle, ongoing cancer treatment, other comorbidities    PT Frequency  2x / week    PT Duration  8 weeks    PT Treatment/Interventions  ADLs/Self Care Home Management;Aquatic Therapy;Electrical Stimulation;Cryotherapy;Moist Heat;Iontophoresis 64m/ml Dexamethasone;Functional mobility training;Therapeutic activities;Therapeutic exercise;Traction;Ultrasound;Neuromuscular re-education;Manual techniques;Patient/family education;Manual lymph drainage;Passive range of motion;Dry needling;Energy conservation;Taping    PT Next Visit Plan  Ease soft tissue restrictions,  increase ROM as able    PT Home Exercise Plan  pulleys shoulder abd/flex, sidelying lat stretch, supine ER stretch, scar massage    Consulted and Agree with Plan of Care  Patient       Patient will benefit from skilled therapeutic intervention in order to improve the following deficits and impairments:  Decreased activity tolerance, Decreased endurance, Decreased range of motion, Decreased skin integrity, Hypomobility, Increased fascial restricitons, Impaired UE functional use, Improper body mechanics, Pain, Postural dysfunction, Impaired flexibility, Decreased scar mobility  Visit Diagnosis: Acute pain of left shoulder     Problem List Patient Active Problem List   Diagnosis Date Noted  . Acquired absence of left breast 09/03/2018  . Visual changes 08/20/2018  . Breast asymmetry following reconstructive surgery 08/13/2018  . Hot flashes due to menopause 06/18/2018  . Status post left mastectomy 06/04/2018  . S/P breast reconstruction 05/28/2018  . Vasomotor symptoms due to menopause 04/17/2018  . Elevated LFTs 03/17/2018  . Goals of care, counseling/discussion 03/17/2018  . Hypomagnesemia 03/04/2018  . Diarrhea 01/02/2018  . Encounter for antineoplastic chemotherapy 12/21/2017  . Encounter for antineoplastic immunotherapy 12/21/2017  . 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 Breast mass, right 12/05/2016  . Monoallelic mutation of CHEK2 gene in female patient 06/20/2015  . 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 [place] [address]Phone: [phone]  Fax:  [phone] Name: Denise BLACKWELDERMRN: [id number] of Birth: 1April 16, 1977

## 2018-11-20 ENCOUNTER — Other Ambulatory Visit: Payer: Self-pay

## 2018-11-21 ENCOUNTER — Other Ambulatory Visit: Payer: Self-pay

## 2018-11-21 ENCOUNTER — Encounter: Payer: Self-pay | Admitting: Physical Therapy

## 2018-11-21 ENCOUNTER — Inpatient Hospital Stay: Payer: BC Managed Care – PPO

## 2018-11-21 ENCOUNTER — Other Ambulatory Visit: Payer: Self-pay | Admitting: Hematology and Oncology

## 2018-11-21 ENCOUNTER — Inpatient Hospital Stay (HOSPITAL_BASED_OUTPATIENT_CLINIC_OR_DEPARTMENT_OTHER): Payer: BC Managed Care – PPO | Admitting: Oncology

## 2018-11-21 ENCOUNTER — Ambulatory Visit: Payer: BC Managed Care – PPO | Admitting: Physical Therapy

## 2018-11-21 ENCOUNTER — Encounter: Payer: Self-pay | Admitting: Oncology

## 2018-11-21 ENCOUNTER — Inpatient Hospital Stay: Payer: BC Managed Care – PPO | Attending: Hematology and Oncology

## 2018-11-21 VITALS — BP 109/71 | HR 82 | Temp 97.4°F | Resp 18 | Ht 67.0 in | Wt 184.7 lb

## 2018-11-21 DIAGNOSIS — Z923 Personal history of irradiation: Secondary | ICD-10-CM

## 2018-11-21 DIAGNOSIS — Z800 Family history of malignant neoplasm of digestive organs: Secondary | ICD-10-CM | POA: Diagnosis not present

## 2018-11-21 DIAGNOSIS — M25512 Pain in left shoulder: Secondary | ICD-10-CM

## 2018-11-21 DIAGNOSIS — C773 Secondary and unspecified malignant neoplasm of axilla and upper limb lymph nodes: Secondary | ICD-10-CM

## 2018-11-21 DIAGNOSIS — C50412 Malignant neoplasm of upper-outer quadrant of left female breast: Secondary | ICD-10-CM

## 2018-11-21 DIAGNOSIS — Z8041 Family history of malignant neoplasm of ovary: Secondary | ICD-10-CM | POA: Insufficient documentation

## 2018-11-21 DIAGNOSIS — Z803 Family history of malignant neoplasm of breast: Secondary | ICD-10-CM

## 2018-11-21 DIAGNOSIS — Z79899 Other long term (current) drug therapy: Secondary | ICD-10-CM

## 2018-11-21 DIAGNOSIS — Z170 Estrogen receptor positive status [ER+]: Secondary | ICD-10-CM

## 2018-11-21 DIAGNOSIS — Z9221 Personal history of antineoplastic chemotherapy: Secondary | ICD-10-CM

## 2018-11-21 DIAGNOSIS — C50212 Malignant neoplasm of upper-inner quadrant of left female breast: Secondary | ICD-10-CM | POA: Diagnosis present

## 2018-11-21 DIAGNOSIS — Z9012 Acquired absence of left breast and nipple: Secondary | ICD-10-CM | POA: Insufficient documentation

## 2018-11-21 DIAGNOSIS — Z888 Allergy status to other drugs, medicaments and biological substances status: Secondary | ICD-10-CM | POA: Insufficient documentation

## 2018-11-21 DIAGNOSIS — Z8042 Family history of malignant neoplasm of prostate: Secondary | ICD-10-CM

## 2018-11-21 DIAGNOSIS — Z5112 Encounter for antineoplastic immunotherapy: Secondary | ICD-10-CM | POA: Diagnosis present

## 2018-11-21 DIAGNOSIS — C50419 Malignant neoplasm of upper-outer quadrant of unspecified female breast: Secondary | ICD-10-CM

## 2018-11-21 LAB — COMPREHENSIVE METABOLIC PANEL
ALT: 19 U/L (ref 0–44)
AST: 23 U/L (ref 15–41)
Albumin: 3.7 g/dL (ref 3.5–5.0)
Alkaline Phosphatase: 73 U/L (ref 38–126)
Anion gap: 8 (ref 5–15)
BUN: 13 mg/dL (ref 6–20)
CO2: 24 mmol/L (ref 22–32)
Calcium: 8.9 mg/dL (ref 8.9–10.3)
Chloride: 104 mmol/L (ref 98–111)
Creatinine, Ser: 0.66 mg/dL (ref 0.44–1.00)
GFR calc Af Amer: >60 mL/min (ref >60)
GFR calc non Af Amer: >60 mL/min (ref >60)
Glucose, Bld: 107 mg/dL — ABNORMAL HIGH (ref 70–99)
Potassium: 3.8 mmol/L (ref 3.5–5.1)
Sodium: 136 mmol/L (ref 135–145)
Total Bilirubin: 0.9 mg/dL (ref 0.3–1.2)
Total Protein: 7.2 g/dL (ref 6.5–8.1)

## 2018-11-21 LAB — CBC WITH DIFFERENTIAL/PLATELET
Abs Immature Granulocytes: 0.01 10*3/uL (ref 0.00–0.07)
Basophils Absolute: 0 10*3/uL (ref 0.0–0.1)
Basophils Relative: 1 %
Eosinophils Absolute: 0.1 10*3/uL (ref 0.0–0.5)
Eosinophils Relative: 2 %
HCT: 35.5 % — ABNORMAL LOW (ref 36.0–46.0)
Hemoglobin: 11.8 g/dL — ABNORMAL LOW (ref 12.0–15.0)
Immature Granulocytes: 0 %
Lymphocytes Relative: 24 %
Lymphs Abs: 1.3 10*3/uL (ref 0.7–4.0)
MCH: 28.4 pg (ref 26.0–34.0)
MCHC: 33.2 g/dL (ref 30.0–36.0)
MCV: 85.3 fL (ref 80.0–100.0)
Monocytes Absolute: 0.4 10*3/uL (ref 0.1–1.0)
Monocytes Relative: 8 %
Neutro Abs: 3.6 10*3/uL (ref 1.7–7.7)
Neutrophils Relative %: 65 %
Platelets: 221 10*3/uL (ref 150–400)
RBC: 4.16 MIL/uL (ref 3.87–5.11)
RDW: 14.7 % (ref 11.5–15.5)
WBC: 5.5 10*3/uL (ref 4.0–10.5)
nRBC: 0 % (ref 0.0–0.2)

## 2018-11-21 LAB — PREGNANCY, URINE: Preg Test, Ur: NEGATIVE

## 2018-11-21 LAB — MAGNESIUM: Magnesium: 1.9 mg/dL (ref 1.7–2.4)

## 2018-11-21 MED ORDER — SODIUM CHLORIDE 0.9 % IV SOLN
Freq: Once | INTRAVENOUS | Status: AC
  Administered 2018-11-21: 12:00:00 via INTRAVENOUS
  Filled 2018-11-21: qty 250

## 2018-11-21 MED ORDER — SODIUM CHLORIDE 0.9 % IV SOLN
3.6000 mg/kg | Freq: Once | INTRAVENOUS | Status: AC
  Administered 2018-11-21: 300 mg via INTRAVENOUS
  Filled 2018-11-21: qty 15

## 2018-11-21 MED ORDER — HEPARIN SOD (PORK) LOCK FLUSH 100 UNIT/ML IV SOLN
500.0000 [IU] | Freq: Once | INTRAVENOUS | Status: AC | PRN
  Administered 2018-11-21: 500 [IU]
  Filled 2018-11-21: qty 5

## 2018-11-21 MED ORDER — ACETAMINOPHEN 325 MG PO TABS
650.0000 mg | ORAL_TABLET | Freq: Once | ORAL | Status: AC
  Administered 2018-11-21: 12:00:00 650 mg via ORAL
  Filled 2018-11-21: qty 2

## 2018-11-21 MED ORDER — DIPHENHYDRAMINE HCL 25 MG PO CAPS
50.0000 mg | ORAL_CAPSULE | Freq: Once | ORAL | Status: AC
  Administered 2018-11-21: 50 mg via ORAL
  Filled 2018-11-21: qty 2

## 2018-11-21 NOTE — Therapy (Signed)
[place] [facility] [address] [place], [address] Phone: [phone]   Fax:  [phone]  Physical Therapy Treatment  Patient Details  Name: Denise [name] MRN: [id number] Date of Birth: 16-Aug-1975 Referring Provider (PT): Dillingham   Encounter Date: 11/21/2018  PT End of Session - 11/21/18 1454    Visit Number  8    Number of Visits  17    Date for PT Re-Evaluation  12/18/18    PT Start Time  0230    PT Stop Time  0315    PT Time Calculation (min)  45 min    Activity Tolerance  Patient tolerated treatment well    Behavior During Therapy  [facility] for tasks assessed/performed       Past Medical History:  Diagnosis Date  . Abnormal breast biopsy    PASH, [name]  . Breast cancer ([place]) 11/2017   left breast  . Family history of breast cancer   . Family history of ovarian cancer   . Fibroadenoma of breast, left 2000, 2016  . Genetic screening 08/26/2013   BRCA/BART negative/Myriad, CHEK2 POS 2017  . Increased risk of breast cancer 2017   IBIS=47%  . Left breast lump 06/18/2017   Fluid/drained [name]  . Migraine   . Monoallelic mutation of CHEK2 gene in female patient 2017   increased risk of breast and colon cancer  . Personal history of chemotherapy   . Personal history of radiation therapy     Past Surgical History:  Procedure Laterality Date  . BREAST BIOPSY Bilateral 09/08/14   neg  . BREAST BIOPSY Left 03/16/2015   Procedure: BREAST BIOPSY WITH NEEDLE LOCALIZATION;  Surgeon: [name], MD;  Location: [facility] ORS;  Service: General;  Laterality: Left;  . BREAST BIOPSY Left 11/2017   positive  . BREAST CYST ASPIRATION Left 06/18/2017   cyst aspiration  . BREAST EXCISIONAL BIOPSY Left 09/2014  . BREAST EXCISIONAL BIOPSY Right    age 50's  . BREAST RECONSTRUCTION WITH PLACEMENT OF TISSUE EXPANDER AND FLEX HD (ACELLULAR HYDRATED DERMIS) Left 05/20/2018   Procedure: BREAST RECONSTRUCTION  WITH PLACEMENT OF TISSUE EXPANDER AND FLEX HD (ACELLULAR HYDRATED DERMIS);  Surgeon: [name], DO;  Location: [facility] OR[facility];  Service: Plastics;  Laterality: Left;  . BREAST SURGERY Right March 2000   benign fibroadenoma  . BREAST SURGERY Left 08/08/13   excision  . BREAST SURGERY Left 09/23/14   excision  . MASTECTOMY Left 05/20/2018  . MASTECTOMY W/ SENTINEL NODE BIOPSY Left 05/20/2018   Procedure: MASTECTOMY WITH SENTINEL LYMPH NODE BIOPSY;  Surgeon: [name], MD;  Location: [facility];  Service: General;  Laterality: Left;  . PORTACATH PLACEMENT Right 12/17/2017   Procedure: INSERTION PORT-A-CATH;  Surgeon: [name], MD;  Location: [facility] ORS;  Service: General;  Laterality: Right;    There were no vitals filed for this visit.  Subjective Assessment - 11/21/18 1432    Subjective  Patient reports she did not sleep well last night d/t her mom being in the hospital with emergency surgery. Patient had treatment today, reporting 1/10 with some fatigue.     Pertinent History  Patient is a 43 year old female with breast cancer history beginning in 2016 with multiple benign lumpectomies. Patient reports first malignant finding this past June 2019 in L breast, with following masectomy with saline expander placed 05/20/18. Patient reports chemo prior with radiation beginning next week 5days/week, for 6 weeks.  Patient reports she is also planning to have R masectomy with saline expansion in the future, unscheduled. 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 Patient reports her ROM in the shoulder is improving, but that  she is having some pain in the inside elbow and into medial forearm with straightening. Patient reports some TTP at incision site, but most all pain at elbow into forearm. Worst pain in past week 7/10 and best 0/10.  Reports pain  is sharp in the medial elbow and forearm; denies numbness or tingling    Limitations  House hold activities;Lifting    How long can you sit comfortably?  unlimited    How long can you stand comfortably?  unlimited    How long can you walk comfortably?  unlimited    Patient Stated Goals  Decrease pain    Pain Onset  1 to 4 weeks ago       Manual STM withtrigger point releaseTo L pec minor/major, latissimus/teres minor, PROM into all planes74mn in flex, abd, IR, and ER inc ROM as able(focus on ER) GHJAPgrade III 30sec bouts 6bouts, and with ER for 6 additional bouts, bilat UE with full ER following, approx 50d LLE and 60d RLE before  Increased ROM throughout manual techniques and decreased pain noted from patient following.ER approx 45d, flex and abd approx 30d increase for full PROM and AROM in all planes following  Ther-Ex - Sidelying ER x5; 3x 10 with 4# DB with min cuing initially for eccentric control good carry over following - Prone snow angels 3x 10 with min cuing initially  - Bent over rows 10# DB 3x 10 with demo and cuing for  - Wall slide x5 for motor control; with GTB 3x 10/8/6 with min cuing for set up and preventing shoulder hiking with good carry over following - Doorway pec stretch 2x 30sec hold in 90/90 position                        PT Education - 11/21/18 1453    Education Details  Exercise form    Person(s) Educated  Patient    Methods  Explanation;Demonstration;Tactile cues;Verbal cues    Comprehension  Verbalized understanding;Returned demonstration;Verbal cues required;Tactile cues required       PT Short Term Goals - 06/21/18 1143      PT SHORT TERM GOAL #1   Title  Pt will be independent with HEP in order to improve strength and motion, and to decrease pain in order to improve pain-free function at home and work.    Time  4    Period  Weeks    Status  New        PT Long Term Goals - 10/24/18 1447      PT LONG TERM  GOAL #1   Title  Pt will decrease worst pain as reported on NPRS by at least 3 points in order to demonstrate clinically significant reduction in pain.    Baseline  10/24/18 4/10    Time  8    Period  Weeks    Status  On-going      PT LONG TERM GOAL #2   Title  Patient will increase FOTO score to 72 to demonstrate predicted increase in functional mobility to complete ADLs    Baseline  10/24/18  Time  8    Period  Weeks    Status  Revised      PT LONG TERM GOAL #3   Title  Patient will demonstrate all active, painfree shoulder ROM within normal limits in order to complete ADLs    Baseline  10/24/18 flex: 150d with "stretch pain" at axilla Abd: 138d with pain directly under breast; IR T10; ER C8    Time  8    Period  Weeks    Status  On-going            Plan - 11/21/18 1520    Clinical Impression Statement  PT continued therex progression for shoulder girdle motor control and UE strengthening. Patient is able to complete all therex with good form following cuing. Pt continues to respond well to manual techniques with increased motion following    Rehab Potential  Good    Clinical Impairments Affecting Rehab Potential  (+) age, motivation, social support, (-) current sedentary lifestyle, ongoing cancer treatment, other comorbidities    PT Frequency  2x / week    PT Treatment/Interventions  ADLs/Self Care Home Management;Aquatic Therapy;Electrical Stimulation;Cryotherapy;Moist Heat;Iontophoresis 67m/ml Dexamethasone;Functional mobility training;Therapeutic activities;Therapeutic exercise;Traction;Ultrasound;Neuromuscular re-education;Manual techniques;Patient/family education;Manual lymph drainage;Passive range of motion;Dry needling;Energy conservation;Taping    PT Next Visit Plan  Ease soft tissue restrictions, increase ROM as able    PT Home Exercise Plan  pulleys shoulder abd/flex, sidelying lat stretch, supine ER stretch, scar massage    Consulted and Agree with Plan of Care   Patient       Patient will benefit from skilled therapeutic intervention in order to improve the following deficits and impairments:  Decreased activity tolerance, Decreased endurance, Decreased range of motion, Decreased skin integrity, Hypomobility, Increased fascial restricitons, Impaired UE functional use, Improper body mechanics, Pain, Postural dysfunction, Impaired flexibility, Decreased scar mobility  Visit Diagnosis: Acute pain of left shoulder     Problem List Patient Active Problem List   Diagnosis Date Noted  . Acquired absence of left breast 09/03/2018  . Visual changes 08/20/2018  . Breast asymmetry following reconstructive surgery 08/13/2018  . Hot flashes due to menopause 06/18/2018  . Status post left mastectomy 06/04/2018  . S/P breast reconstruction 05/28/2018  . Vasomotor symptoms due to menopause 04/17/2018  . Elevated LFTs 03/17/2018  . Goals of care, counseling/discussion 03/17/2018  . Hypomagnesemia 03/04/2018  . Diarrhea 01/02/2018  . Encounter for antineoplastic chemotherapy 12/21/2017  . Encounter for antineoplastic immunotherapy 12/21/2017  . 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 Breast mass, right 12/05/2016  . Monoallelic mutation of CHEK2 gene in female patient 06/20/2015  . Malignant neoplasm of upper-outer quadrant of female breast ([place] 09/17/2014   CShelton SilvasPT, DPT CShelton Silvas6/09/2018, 3:37 PM  [facility] [address] [place] [address]Phone: [phone]  Fax:  [phone] Name: Denise HOFFARTMRN: [id number] of Birth: [birth year]

## 2018-11-21 NOTE — Progress Notes (Signed)
[facility]  [address] [place], [place] [address] Phone: [phone]  Fax: [phone]   Clinic Day:  11/21/2018   Referring physician: [name],*  Chief Complaint: Denise [name] is a 43 y.o. female with multi-focal Her2/neu + left breast cancer who is seen for assessment prior to cycle #7 Kadcyla  HPI: Patient was last seen in medical oncology on 10/24/2018.  At that time, she had been doing well.  Vision issues had resolved.  Admitted to some stiffness in her shoulders and back that resolved on its own.  She was doing well on tamoxifen and denied any night sweats or hot flashes.  Her blood sugars remain controlled.  She received cycle 6 Kadcyla on 10/24/2018.  She began menstruating again on 10/15/2018.  Last GYN appointment was approximately 08/2017.   During the interim, she has been doing well.  She states that she has quarantined herself with her 2 daughters for the past 3 months and everyone is "going crazy".  She is kept her distance from family and has very few visitors even given some restrictions have been lifted.  She has questions regarding her children attending small socially distant summer camps this summer. She denies interval infections or fevers.  Weight remains stable.   Past Medical History:  Diagnosis Date  . Abnormal breast biopsy    [facility], [name]  . Breast cancer ([place]) 11/2017   left breast  . Family history of breast cancer   . Family history of ovarian cancer   . Fibroadenoma of breast, left 2000, 2016  . Genetic screening 08/26/2013   BRCA/BART negative/Myriad, CHEK2 POS 2017  . Increased risk of breast cancer 2017   IBIS=47%  . Left breast lump 06/18/2017   Fluid/drained [name]  . Migraine   . Monoallelic mutation of CHEK2 gene in female patient 2017   increased risk of breast and colon cancer  . Personal history of chemotherapy   . Personal history of radiation therapy     Past Surgical  History:  Procedure Laterality Date  . BREAST BIOPSY Bilateral 09/08/14   neg  . BREAST BIOPSY Left 03/16/2015   Procedure: BREAST BIOPSY WITH NEEDLE LOCALIZATION;  Surgeon: [name], MD;  Location: [facility] ORS;  Service: General;  Laterality: Left;  . BREAST BIOPSY Left 11/2017   positive  . BREAST CYST ASPIRATION Left 06/18/2017   cyst aspiration  . BREAST EXCISIONAL BIOPSY Left 09/2014  . BREAST EXCISIONAL BIOPSY Right    age 38's  . BREAST RECONSTRUCTION WITH PLACEMENT OF TISSUE EXPANDER AND FLEX HD (ACELLULAR HYDRATED DERMIS) Left 05/20/2018   Procedure: BREAST RECONSTRUCTION WITH PLACEMENT OF TISSUE EXPANDER AND FLEX HD (ACELLULAR HYDRATED DERMIS);  Surgeon: [name], DO;  Location: [facility] ORS;  Service: Plastics;  Laterality: Left;  . BREAST SURGERY Right March 2000   benign fibroadenoma  . BREAST SURGERY Left 08/08/13   excision  . BREAST SURGERY Left 09/23/14   excision  . MASTECTOMY Left 05/20/2018  . MASTECTOMY W/ SENTINEL NODE BIOPSY Left 05/20/2018   Procedure: MASTECTOMY WITH SENTINEL LYMPH NODE BIOPSY;  Surgeon: [name], MD;  Location: [facility] ORS;  Service: General;  Laterality: Left;  . PORTACATH PLACEMENT Right 12/17/2017   Procedure: INSERTION PORT-A-CATH;  Surgeon: [name], MD;  Location: [facility] ORS;  Service: General;  Laterality: Right;    Family History  Problem Relation Age of Onset  . Prostate cancer Father 22  . Breast cancer Paternal  Grandmother 51  . Breast cancer Maternal Aunt 60  . Ovarian cancer Maternal Aunt 70  . Breast cancer Paternal Aunt 31  . Pancreatic cancer Maternal Aunt 75    Social History:  reports that she has never smoked. She has never used smokeless tobacco. She reports current alcohol use. She reports that she does not use drugs. She does not smoke. She "occasionally" drinks alcohol. 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 Patient denies known exposures to radiation on toxins. Her  husband's name is [name]. She has 2 daughters, [name] and [name] (ages 27 1/2 and 76).  Her husband's name is [name]. The patient is alone today.  Allergies:  Allergies  Allergen Reactions  . Steri-Strip Compound Benzoin [Benzoin Compound]     Steri-strip ,   . Tape Other (See Comments)    Minor skin irritation     Current Medications: Current Outpatient Medications  Medication Sig Dispense Refill  . magnesium oxide (MAG-OX) 400 MG tablet Take 1 tablet (400 mg total) by mouth daily. (Patient taking differently: Take 400 mg by mouth 2 (two) times a week. ) 30 tablet 0  . tamoxifen (NOLVADEX) 20 MG tablet Take 1 tablet (20 mg total) by mouth daily. 30 tablet 3  . vitamin C (ASCORBIC ACID) 500 MG tablet Take 500 mg by mouth every other day.    Marland Kitchen acetaminophen (TYLENOL) 500 MG tablet Take 500 mg by mouth every 6 (six) hours as needed for moderate pain or headache.    . diazepam (VALIUM) 2 MG tablet Take 1 tablet (2 mg total) by mouth every 6 (six) hours as needed for anxiety. (Patient not taking: Reported on 11/21/2018) 30 tablet 0  . HYDROcodone-acetaminophen (NORCO) 5-325 MG tablet Take 1 tablet by mouth every 8 (eight) hours as needed for moderate pain. (Patient not taking: Reported on 09/04/2018) 20 tablet 0  . lidocaine-prilocaine (EMLA) cream Apply 1 application topically as needed (port access).    Marland Kitchen omeprazole (PRILOSEC OTC) 20 MG tablet Take 20 mg by mouth daily as needed (for acid reflux).    . venlafaxine XR (EFFEXOR-XR) 37.5 MG 24 hr capsule TAKE 1 CAPSULE BY MOUTH ONCE DAILY WITH BREAKFAST (Patient not taking: No sig reported) 90 capsule 0   No current facility-administered medications for this visit.    Facility-Administered Medications Ordered in Other Visits  Medication Dose Route Frequency Provider Last Rate Last Dose  . sodium chloride flush (NS) 0.9 % injection 10 mL  10 mL Intravenous PRN Lequita Asal, MD   10 mL at 04/17/18 0844    Review of Systems  Constitutional:  Positive for malaise/fatigue. Negative for chills, fever and weight loss.  HENT: Negative for congestion, ear pain and tinnitus.   Eyes: Negative.  Negative for blurred vision and double vision.  Respiratory: Negative.  Negative for cough, sputum production and shortness of breath.   Cardiovascular: Negative.  Negative for chest pain, palpitations and leg swelling.  Gastrointestinal: Negative.  Negative for abdominal pain, constipation, diarrhea, nausea and vomiting.  Genitourinary: Negative for dysuria, frequency and urgency.  Musculoskeletal: Negative for back pain and falls.  Skin: Negative.  Negative for rash.  Neurological: Negative.  Negative for weakness and headaches.  Endo/Heme/Allergies: Negative.  Does not bruise/bleed easily.  Psychiatric/Behavioral: Negative.  Negative for depression. The patient is not nervous/anxious and does not have insomnia.    Performance status (ECOG):  0  Physical Exam  Constitutional: She is oriented to person, place, and time. Vital  signs are normal. She appears well-developed and well-nourished.  HENT:  Head: Normocephalic and atraumatic.  Eyes: Pupils are equal, round, and reactive to light.  Neck: Normal range of motion.  Cardiovascular: Normal rate and regular rhythm.  No murmur heard. Pulmonary/Chest: Effort normal and breath sounds normal. She has no wheezes.  Abdominal: Soft. Bowel sounds are normal. She exhibits no distension and no mass. There is no abdominal tenderness.  Musculoskeletal: Normal range of motion.        General: No edema.  Neurological: She is alert and oriented to person, place, and time.  Skin: Skin is warm and dry.  Psychiatric: She has a normal mood and affect. Her behavior is normal.    Appointment on 11/21/2018  Component Date Value Ref Range Status  . Preg Test, Ur 11/21/2018 NEGATIVE  NEGATIVE Final   Performed at [facility], [address] [place], [place] [address]  . Magnesium 11/21/2018  1.9  1.7 - 2.4 mg/dL Final   Performed at [facility], [address] [place], [place] [address]  . Sodium 11/21/2018 136  135 - 145 mmol/L Final  . Potassium 11/21/2018 3.8  3.5 - 5.1 mmol/L Final  . Chloride 11/21/2018 104  98 - 111 mmol/L Final  . CO2 11/21/2018 24  22 - 32 mmol/L Final  . Glucose, Bld 11/21/2018 107* 70 - 99 mg/dL Final  . BUN 11/21/2018 13  6 - 20 mg/dL Final  . Creatinine, Ser 11/21/2018 0.66  0.44 - 1.00 mg/dL Final  . Calcium 11/21/2018 8.9  8.9 - 10.3 mg/dL Final  . Total Protein 11/21/2018 7.2  6.5 - 8.1 g/dL Final  . Albumin 11/21/2018 3.7  3.5 - 5.0 g/dL Final  . AST 11/21/2018 23  15 - 41 U/L Final  . ALT 11/21/2018 19  0 - 44 U/L Final  . Alkaline Phosphatase 11/21/2018 73  38 - 126 U/L Final  . Total Bilirubin 11/21/2018 0.9  0.3 - 1.2 mg/dL Final  . GFR calc non Af Amer 11/21/2018 >60  >60 mL/min Final  . GFR calc Af Amer 11/21/2018 >60  >60 mL/min Final  . Anion gap 11/21/2018 8  5 - 15 Final   Performed at [facility], [address] [place], [place] [address]  . WBC 11/21/2018 5.5  4.0 - 10.5 K/uL Final  . RBC 11/21/2018 4.16  3.87 - 5.11 MIL/uL Final  . Hemoglobin 11/21/2018 11.8* 12.0 - 15.0 g/dL Final  . HCT 11/21/2018 35.5* 36.0 - 46.0 % Final  . MCV 11/21/2018 85.3  80.0 - 100.0 fL Final  . MCH 11/21/2018 28.4  26.0 - 34.0 pg Final  . MCHC 11/21/2018 33.2  30.0 - 36.0 g/dL Final  . RDW 11/21/2018 14.7  11.5 - 15.5 % Final  . Platelets 11/21/2018 221  150 - 400 K/uL Final  . nRBC 11/21/2018 0.0  0.0 - 0.2 % Final  . Neutrophils Relative % 11/21/2018 65  % Final  . Neutro Abs 11/21/2018 3.6  1.7 - 7.7 K/uL Final  . Lymphocytes Relative 11/21/2018 24  % Final  . Lymphs Abs 11/21/2018 1.3  0.7 - 4.0 K/uL Final  . Monocytes Relative 11/21/2018 8  % Final  . Monocytes Absolute 11/21/2018 0.4  0.1 - 1.0 K/uL Final  . Eosinophils Relative 11/21/2018 2  % Final  . Eosinophils Absolute 11/21/2018 0.1  0.0 - 0.5 K/uL  Final  . Basophils Relative 11/21/2018 1  % Final  . Basophils  Absolute 11/21/2018 0.0  0.0 - 0.1 K/uL Final  . Immature Granulocytes 11/21/2018 0  % Final  . Abs Immature Granulocytes 11/21/2018 0.01  0.00 - 0.07 K/uL Final   Performed at [facility], [address], [place], [place] [address]    Assessment:  Denise [name] is a 43 y.o. 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  Pathology revealed no residual carcinoma in the breast.  One of three sentinel lymph nodes were positive.  One lymph node had 2 metastatic foci (2.1 mm and 1 mm).  Pathologic stage revealed ypT0 ypN1a.  Index breast mass and axillary node biopsy on 12/06/2017 revealed grade II invasive ductal carcinoma with calcifications at the 11 o'clock position.  There was metastatic carcinoma in 1 of 1 lymph nodes.  Tumor was ER + (100%), PR + (100%), and Ki67 5%.  Her2/neu was 3+ by IHC (heterogeneous) and FISH +.  Diagnostic left mammogram and ultrasound on 12/06/2017 revealed a 1.7 x 1.3 x 1.3 cm irregular hypoechoic mass with internal calcifications at the 11 o'clock position 2 cm from the nipple with internal calcifications. There was a similar appearing 0.9 x 0.8 x 0.7 cm mass at the 11 o'clock position 4 cm from the nipple (2.4 cm from the index lesion).  There were suspicious, segmental calcifications originating from the index lesion and extending 5 cm posteriorly.  There was a 0.6 cm morphologically abnormal left axillary lymph node.  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 The adjacent and more superiorly located 11 o'clock mass (7 x 12 mm) seen on recent ultrasound, 4 cm from the nipple on ultrasound, was also identified. There were 2 probable satellite lesions (7 mm,  medial lesion; posterior and lateral lesion). The total span of malignancy was 3 x 3.1 x 4.5 cm.  There were calcifications extending up to 5 cm posterior to the biopsied malignancy which were highly suspicious on mammography but not appreciated.  There was a mass in the lower outer left breast measured 5 mm, representing a change.  The known metastatic node in the left axilla was identified. A node along the posterolateral margin of the pectoralis minor (cortex 5 mm) was nonspecific but at least somewhat suspicious.  Chest, abdomen, and pelvic CT on 12/19/2017 revealed a solitary enlarged biopsy-proven metastatic left axillary lymph node.  There were no additional findings suspicious for metastatic disease in the chest, abdomen or pelvis.  There was a nonspecific tiny sclerotic upper right sacral lesion, more likely a benign bone island. Bone scintigraphy correlation versus follow-up CT could be considered.  Bone scan on 01/11/2018 was negative for metastatic pattern. No areas of focal tracer uptake. Area of concern in sacrum on CT likely represents benign bone island.   Myriad genetic testing on 08/26/2013 was negative for BRCA1 and 2. 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  She has a family history significant for breast, ovarian, pancreatic, and prostate cancer.  She received 6 cycles of TCHP chemotherapy (12/21/2017 - 04/17/2018) with Margarette Canada support. Cycle #3 was held due to elevated LFTs on 02/04/2018. She received Herceptin + Perjeta alone (04/30/2018 - 05/30/2018).  She is s/p 5 cycles of Kadcyla (06/20/2018 - 07/11/2018; 08/20/2018- 10/04/2018).  Cycle #3 was held on 08/01/2018 due to  visual changes (blurred vision).  She received breast radiation from 06/26/2018 - 08/05/2018.  Echocardiograms:  EF 60-65% on 12/20/2017, 60-65% on 03/27/2018, 55-60% on 06/18/2018, and 55-60% on 09/27/2018.  She has a history of elevated LFTs.  Hepatitis B and C serologies  were negative on 02/04/2018.  RUQ ultrasound on 02/06/2018 revealed a small anterior gallbladder wall polyp. There was no evidence of cholelithiasis or cholecystis. CBD measured normal at 2 mm, with no evidence of choledocholithiasis. There was normal direction of blood flow towards the liver noted.  She has vasomotor symptoms.  She discontinued Effexor around 09/18/2018.  She was amenorrheic from 11/2017 - 10/15/2018.  [place] and estradiol confirmed a premenopausal state on 10/01/2018.   Menses restarted on 10/15/2018.  Symptomatically, she is doing well.  Exam is unremarkable.  Plan: 1. Labs today:  CBC with diff, CMP, Mg. 2. Stage IB multifocal LEFT breast cancer Clinically, she is doing well. Echo on 09/27/2018 was normal. She notes minor muscloskeletal side effects associated with Kadcyla (incidence 36%). She is tolerating tamoxifen well. She is premenopausal on testing with resumption of menses on 10/15/2018. Cycle #7 Kadcycla today. 3. Vision changes             Patient with initial visual changes associated with Kadcyla.  No current visual changes.  Continue to monitor.  4. History of abnormal LFTs AST 23.  ALT 19.  Bilirubin 0.9. 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 Continue to monitor. 5. Vasomotor symptoms  Patient denies hot flashes or night sweats. 6. Education provided to patient regarding current global pandemic and precautions to  maintain her safety given she is currently under active treatment for cancer.  I explained  she should use her best judgment when allowing her children to attend social gatherings  during this time.  She understands her precautions and she is trying to find a happy  medium given her children are teenagers.  We reviewed her previous labs. 7. She is scheduled to have her right mastectomy on 12/09/2018 which is been postponed  several months due to pandemic.  She is very excited to have this procedure done.                 8.    RTC in 3 weeks for MD assessment, labs (CBC with diff, CMP, magnesium) and cycle 8 Kadcyla.  Greater than 50% was spent in counseling and coordination of care with this patient including but not limited to discussion of the relevant topics above (See A&P) including, but not limited to diagnosis and management of acute and chronic medical conditions.   Faythe Casa, NP 11/25/2018 3:35 PM

## 2018-11-21 NOTE — Progress Notes (Signed)
No new changes noted today 

## 2018-11-26 ENCOUNTER — Ambulatory Visit: Payer: BC Managed Care – PPO | Admitting: Physical Therapy

## 2018-11-26 ENCOUNTER — Encounter: Payer: Self-pay | Admitting: Physical Therapy

## 2018-11-26 ENCOUNTER — Other Ambulatory Visit: Payer: Self-pay

## 2018-11-26 DIAGNOSIS — M25512 Pain in left shoulder: Secondary | ICD-10-CM | POA: Diagnosis not present

## 2018-11-26 NOTE — Therapy (Signed)
[facility] [address] [place], [address] Phone: [phone]   Fax:  [phone]  Physical Therapy Treatment  Patient Details  Name: Denise [name] MRN: [id number] Date of Birth: 01-21-1976 Referring Provider (PT): Dillingham   Encounter Date: 11/26/2018  PT End of Session - 11/26/18 1650    Visit Number  9    Number of Visits  17    Date for PT Re-Evaluation  12/18/18    PT Start Time  0425    PT Stop Time  0510    PT Time Calculation (min)  45 min    Activity Tolerance  Patient tolerated treatment well    Behavior During Therapy  [facility] for tasks assessed/performed       Past Medical History:  Diagnosis Date  . Abnormal breast biopsy    PASH, [name]  . Breast cancer ([facility] [place]) 11/2017   left breast  . Family history of breast cancer   . Family history of ovarian cancer   . Fibroadenoma of breast, left 2000, 2016  . Genetic screening 08/26/2013   BRCA/BART negative/Myriad, CHEK2 POS 2017  . Increased risk of breast cancer 2017   IBIS=47%  . Left breast lump 06/18/2017   Fluid/drained [name]  . Migraine   . Monoallelic mutation of CHEK2 gene in female patient 2017   increased risk of breast and colon cancer  . Personal history of chemotherapy   . Personal history of radiation therapy     Past Surgical History:  Procedure Laterality Date  . BREAST BIOPSY Bilateral 09/08/14   neg  . BREAST BIOPSY Left 03/16/2015   Procedure: BREAST BIOPSY WITH NEEDLE LOCALIZATION;  Surgeon: [name], MD;  Location: [facility] ORS;  Service: General;  Laterality: Left;  . BREAST BIOPSY Left 11/2017   positive  . BREAST CYST ASPIRATION Left 06/18/2017   cyst aspiration  . BREAST EXCISIONAL BIOPSY Left 09/2014  . BREAST EXCISIONAL BIOPSY Right    age 36's  . BREAST RECONSTRUCTION WITH PLACEMENT OF TISSUE EXPANDER AND FLEX HD (ACELLULAR HYDRATED DERMIS) Left 05/20/2018   Procedure: BREAST RECONSTRUCTION  WITH PLACEMENT OF TISSUE EXPANDER AND FLEX HD (ACELLULAR HYDRATED DERMIS);  Surgeon: [name], DO;  Location: [facility] ORS;  Service: Plastics;  Laterality: Left;  . BREAST SURGERY Right March 2000   benign fibroadenoma  . BREAST SURGERY Left 08/08/13   excision  . BREAST SURGERY Left 09/23/14   excision  . MASTECTOMY Left 05/20/2018  . MASTECTOMY W/ SENTINEL NODE BIOPSY Left 05/20/2018   Procedure: MASTECTOMY WITH SENTINEL LYMPH NODE BIOPSY;  Surgeon: [name], MD;  Location: [facility] ORS;  Service: General;  Laterality: Left;  . PORTACATH PLACEMENT Right 12/17/2017   Procedure: INSERTION PORT-A-CATH;  Surgeon: [name], MD;  Location: [facility] ORS;  Service: General;  Laterality: Right;    There were no vitals filed for this visit.  Subjective Assessment - 11/26/18 1629    Subjective  Reports 1-2/10 pain in ant shoulder/lat. Reports no forearm pain to date. Good shoulder motion.     Pertinent History  Patient is a 43 year old female with breast cancer history beginning in 2016 with multiple benign lumpectomies. Patient reports first malignant finding this past June 2019 in L breast, with following masectomy with saline expander placed 05/20/18. Patient reports chemo prior with radiation beginning next week 5days/week, for 6 weeks. Patient reports she is also planning to have R masectomy with saline expansion  in the future, unscheduled. 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 Patient reports she is a mother of a 55 year old and 13 year old that she reports are very involved in extra-curriculars. Patient reports her ROM in the shoulder is improving, but that  she is having some pain in the inside elbow and into medial forearm with straightening. Patient reports some TTP at incision site, but most all pain at elbow into forearm. Worst pain in past week 7/10 and best 0/10.  Reports pain is sharp in the medial elbow and forearm; denies numbness or tingling     Limitations  House hold activities;Lifting    How long can you sit comfortably?  unlimited    How long can you stand comfortably?  unlimited    How long can you walk comfortably?  unlimited    Patient Stated Goals  Decrease pain    Pain Onset  1 to 4 weeks ago        Manual STM withtrigger point releaseTo L pec minor/major, latissimus/teres minor,UT, ant and middle deltoid; less time spent on forearm musculature, most spent on UT, pec major/minor PROM into all planes58mn in flex, abd, IR, and ER inc ROM as able(focus on ER) GHJAPgrade III 30sec bouts 6bouts, and with ER for 6 additional bouts, with flex 6 bouts Increased ROM throughout manual techniques   Ther-Ex - Prone snow angels 3x 10 with TC initially for scapular retraction with good carry over - Prone rows 10# 3x 10 with min cuing initially for proper form with good carry over following - Wall slides GTB 3x 10 with min cuing for set up with good carry over following - Lat pulldown 35# 3x 10 with good carry over from previous session - Supine pec stretch on half foam roll 2x 45sec - Standing rows 25# 3x 10 with good carry over from previous sessions - Standing scaption 3# 3x 10 with cuing for set up with good carry over following                         PT Education - 11/26/18 1649    Education Details  Exercise form    Person(s) Educated  Patient    Methods  Explanation;Demonstration;Tactile cues;Verbal cues    Comprehension  Verbalized understanding;Returned demonstration;Verbal cues required;Tactile cues required       PT Short Term Goals - 06/21/18 1143      PT SHORT TERM GOAL #1   Title  Pt will be independent with HEP in order to improve strength and motion, and to decrease pain in order to improve pain-free function at home and work.    Time  4    Period  Weeks    Status  New        PT Long Term Goals - 10/24/18 1447      PT LONG TERM GOAL #1   Title  Pt will decrease worst  pain as reported on NPRS by at least 3 points in order to demonstrate clinically significant reduction in pain.    Baseline  10/24/18 4/10    Time  8    Period  Weeks    Status  On-going      PT LONG TERM GOAL #2   Title  Patient will increase FOTO score to 72 to demonstrate predicted increase in functional mobility to complete ADLs    Baseline  10/24/18     Time  8  Period  Weeks    Status  Revised      PT LONG TERM GOAL #3   Title  Patient will demonstrate all active, painfree shoulder ROM within normal limits in order to complete ADLs    Baseline  10/24/18 flex: 150d with "stretch pain" at axilla Abd: 138d with pain directly under breast; IR T10; ER C8    Time  8    Period  Weeks    Status  On-going            Plan - 11/26/18 1723    Clinical Impression Statement  Patient increasing ROM allowing for continued therex progression, which patient is able to complete without pain, with noted muscle fatigue. Some cuing needed for proper scapulohumeral rhythm with good carry over. PT will continue progression for strength and motion as able.     Rehab Potential  Good    Clinical Impairments Affecting Rehab Potential  (+) age, motivation, social support, (-) current sedentary lifestyle, ongoing cancer treatment, other comorbidities    PT Frequency  2x / week    PT Duration  8 weeks    PT Treatment/Interventions  ADLs/Self Care Home Management;Aquatic Therapy;Electrical Stimulation;Cryotherapy;Moist Heat;Iontophoresis 65m/ml Dexamethasone;Functional mobility training;Therapeutic activities;Therapeutic exercise;Traction;Ultrasound;Neuromuscular re-education;Manual techniques;Patient/family education;Manual lymph drainage;Passive range of motion;Dry needling;Energy conservation;Taping    PT Next Visit Plan  Ease soft tissue restrictions, increase ROM as able    PT Home Exercise Plan  pulleys shoulder abd/flex, sidelying lat stretch, supine ER stretch, scar massage    Consulted and Agree with  Plan of Care  Patient       Patient will benefit from skilled therapeutic intervention in order to improve the following deficits and impairments:  Decreased activity tolerance, Decreased endurance, Decreased range of motion, Decreased skin integrity, Hypomobility, Increased fascial restricitons, Impaired UE functional use, Improper body mechanics, Pain, Postural dysfunction, Impaired flexibility, Decreased scar mobility  Visit Diagnosis: Acute pain of left shoulder     Problem List Patient Active Problem List   Diagnosis Date Noted  . Acquired absence of left breast 09/03/2018  . Visual changes 08/20/2018  . Breast asymmetry following reconstructive surgery 08/13/2018  . Hot flashes due to menopause 06/18/2018  . Status post left mastectomy 06/04/2018  . S/P breast reconstruction 05/28/2018  . Vasomotor symptoms due to menopause 04/17/2018  . Elevated LFTs 03/17/2018  . Goals of care, counseling/discussion 03/17/2018  . Hypomagnesemia 03/04/2018  . Diarrhea 01/02/2018  . Encounter for antineoplastic chemotherapy 12/21/2017  . Encounter for antineoplastic immunotherapy 12/21/2017  . 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 Breast mass, right 12/05/2016  . Monoallelic mutation of CHEK2 gene in female patient 06/20/2015  . [facility] [place]   CShelton SilvasPT, DPT CShelton Silvas6/02/2019, 5:24 PM  [facility] [address] [place] [address]Phone: [phone]  Fax:  [phone] Name: ECHESNEE FLORENMRN: [id number] of Birth: [birth year]

## 2018-11-28 ENCOUNTER — Other Ambulatory Visit: Payer: Self-pay

## 2018-11-28 ENCOUNTER — Encounter: Payer: Self-pay | Admitting: Physical Therapy

## 2018-11-28 ENCOUNTER — Ambulatory Visit: Payer: BC Managed Care – PPO | Admitting: Physical Therapy

## 2018-11-28 DIAGNOSIS — M25512 Pain in left shoulder: Secondary | ICD-10-CM | POA: Diagnosis not present

## 2018-11-28 NOTE — Therapy (Signed)
[facility] [address] [place], [address] Phone: [phone]   Fax:  [phone]  Physical Therapy Treatment  Patient Details  Name: Denise [name] MRN: [id number] Date of Birth: 17-Apr-1976 Referring Provider (PT): Dillingham   Encounter Date: 11/28/2018  PT End of Session - 11/28/18 1706    Visit Number  10    Number of Visits  17    Date for PT Re-Evaluation  12/18/18    PT Start Time  0430    PT Stop Time  0515    PT Time Calculation (min)  45 min    Activity Tolerance  Patient tolerated treatment well    Behavior During Therapy  [facility] for tasks assessed/performed       Past Medical History:  Diagnosis Date  . Abnormal breast biopsy    PASH, [name]  . Breast cancer ([facility]) 11/2017   left breast  . Family history of breast cancer   . Family history of ovarian cancer   . Fibroadenoma of breast, left 2000, 2016  . Genetic screening 08/26/2013   BRCA/BART negative/Myriad, CHEK2 POS 2017  . Increased risk of breast cancer 2017   IBIS=47%  . Left breast lump 06/18/2017   Fluid/drained [name]  . Migraine   . Monoallelic mutation of CHEK2 gene in female patient 2017   increased risk of breast and colon cancer  . Personal history of chemotherapy   . Personal history of radiation therapy     Past Surgical History:  Procedure Laterality Date  . BREAST BIOPSY Bilateral 09/08/14   neg  . BREAST BIOPSY Left 03/16/2015   Procedure: BREAST BIOPSY WITH NEEDLE LOCALIZATION;  Surgeon: [name], MD;  Location: [facility] OR[facility];  Service: General;  Laterality: Left;  . BREAST BIOPSY Left 11/2017   positive  . BREAST CYST ASPIRATION Left 06/18/2017   cyst aspiration  . BREAST EXCISIONAL BIOPSY Left 09/2014  . BREAST EXCISIONAL BIOPSY Right    age 28's  . BREAST RECONSTRUCTION WITH PLACEMENT OF TISSUE EXPANDER AND FLEX HD (ACELLULAR HYDRATED DERMIS) Left 05/20/2018   Procedure: BREAST  RECONSTRUCTION WITH PLACEMENT OF TISSUE EXPANDER AND FLEX HD (ACELLULAR HYDRATED DERMIS);  Surgeon: [name], DO;  Location: [facility] OR[facility];  Service: Plastics;  Laterality: Left;  . BREAST SURGERY Right March 2000   benign fibroadenoma  . BREAST SURGERY Left 08/08/13   excision  . BREAST SURGERY Left 09/23/14   excision  . MASTECTOMY Left 05/20/2018  . MASTECTOMY W/ SENTINEL NODE BIOPSY Left 05/20/2018   Procedure: MASTECTOMY WITH SENTINEL LYMPH NODE BIOPSY;  Surgeon: [name], MD;  Location: [facility];  Service: General;  Laterality: Left;  . PORTACATH PLACEMENT Right 12/17/2017   Procedure: INSERTION PORT-A-CATH;  Surgeon: [name], MD;  Location: [facility];  Service: General;  Laterality: Right;    There were no vitals filed for this visit.  Subjective Assessment - 11/28/18 1634    Subjective  Pt reports increased soreness and tension following last session. REports 1/10 pain but increased tension with overhead motions.    Pertinent History  Patient is a 43 year old female with breast cancer history beginning in 2016 with multiple benign lumpectomies. Patient reports first malignant finding this past June 2019 in L breast, with following masectomy with saline expander placed 05/20/18. Patient reports chemo prior with radiation beginning next week 5days/week, for 6 weeks. Patient reports she is also planning to have R masectomy with  saline expansion in the future, unscheduled. 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 Patient reports her ROM in the shoulder is improving, but that  she is having some pain in the inside elbow and into medial forearm with straightening. Patient reports some TTP at incision site, but most all pain at elbow into forearm. Worst pain in past week 7/10 and best 0/10.  Reports pain is sharp in the medial  elbow and forearm; denies numbness or tingling    How long can you sit comfortably?  unlimited    How long can you stand comfortably?  unlimited    How long can you walk comfortably?  unlimited    Patient Stated Goals  Decrease pain    Pain Onset  1 to 4 weeks ago         Manual STM withtrigger point releaseTo L pec minor/major, latissimus/teres minor,UT, ant and middle deltoid; less time spent on forearm musculature, most spent on UT, pec major/minor (heavy focus on UT d/t increased patient complaint PROM into all planes9mn in flex, abd, IR, and ER inc ROM as able(focus on ER) GHJAPgrade III 30sec bouts 6bouts, and with ER for 6 additional bouts, with flex 6 bouts Cervical traction 10sec traction 10sec release x10  Increased ROM throughout manual techniques   Ther-Ex - Lat pulldown 45# 3x 6 with good carry over from previous session -Standing rows 45# 3x 6with good carry over from previous sessions - Supine serratus punch 4# 3x 10 with good carry over of proper form following demo and initial cuing - Pec stretch on 1/2 foam roll 2x 30sec                      PT Education - 11/28/18 1700    Education Details  Exercise form    Person(s) Educated  Patient    Methods  Explanation;Demonstration;Verbal cues    Comprehension  Verbalized understanding;Returned demonstration;Verbal cues required       PT Short Term Goals - 06/21/18 1143      PT SHORT TERM GOAL #1   Title  Pt will be independent with HEP in order to improve strength and motion, and to decrease pain in order to improve pain-free function at home and work.    Time  4    Period  Weeks    Status  New        PT Long Term Goals - 10/24/18 1447      PT LONG TERM GOAL #1   Title  Pt will decrease worst pain as reported on NPRS by at least 3 points in order to demonstrate clinically significant reduction in pain.    Baseline  10/24/18 4/10    Time  8    Period  Weeks    Status  On-going       PT LONG TERM GOAL #2   Title  Patient will increase FOTO score to 72 to demonstrate predicted increase in functional mobility to complete ADLs    Baseline  10/24/18     Time  8    Period  Weeks    Status  Revised      PT LONG TERM GOAL #3   Title  Patient will demonstrate all active, painfree shoulder ROM within normal limits in order to complete ADLs    Baseline  10/24/18 flex: 150d with "stretch pain" at axilla Abd: 138d with pain directly under breast; IR T10; ER C8    Time  8    Period  Weeks    Status  On-going            Plan - 11/28/18 1718    Clinical Impression Statement  Patient with some increased muscle tension and soreness this session, with increased manual techniques utilized to decrease pain/tension; which patient continues to respond well to. PT continued therex progression within patient tolerance, wich she is demonstrating good carry over of proper form and motor control with between sessions.    Rehab Potential  Good    Clinical Impairments Affecting Rehab Potential  (+) age, motivation, social support, (-) current sedentary lifestyle, ongoing cancer treatment, other comorbidities    PT Frequency  2x / week    PT Duration  8 weeks    PT Treatment/Interventions  ADLs/Self Care Home Management;Aquatic Therapy;Electrical Stimulation;Cryotherapy;Moist Heat;Iontophoresis 49m/ml Dexamethasone;Functional mobility training;Therapeutic activities;Therapeutic exercise;Traction;Ultrasound;Neuromuscular re-education;Manual techniques;Patient/family education;Manual lymph drainage;Passive range of motion;Dry needling;Energy conservation;Taping    PT Next Visit Plan  Ease soft tissue restrictions, increase ROM as able    PT Home Exercise Plan  pulleys shoulder abd/flex, sidelying lat stretch, supine ER stretch, scar massage    Consulted and Agree with Plan of Care  Patient       Patient will benefit from skilled therapeutic intervention in order to improve the following  deficits and impairments:         Problem List Patient Active Problem List   Diagnosis Date Noted  . Acquired absence of left breast 09/03/2018  . Visual changes 08/20/2018  . Breast asymmetry following reconstructive surgery 08/13/2018  . Hot flashes due to menopause 06/18/2018  . Status post left mastectomy 06/04/2018  . S/P breast reconstruction 05/28/2018  . Vasomotor symptoms due to menopause 04/17/2018  . Elevated LFTs 03/17/2018  . Goals of care, counseling/discussion 03/17/2018  . Hypomagnesemia 03/04/2018  . Diarrhea 01/02/2018  . Encounter for antineoplastic chemotherapy 12/21/2017  . Encounter for antineoplastic immunotherapy 12/21/2017  . 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 Breast mass, right 12/05/2016  . Monoallelic mutation of CHEK2 gene in female patient 06/20/2015  . [place]17[place]   CShelton SilvasPT, DPT CShelton Silvas6/04/2019, 5:22 PM  [facility] [address] [place] [address]Phone: [phone]  Fax:  [phone] Name: EMINYON BILLITERMRN: [id number] of Birth: 112/05/1976

## 2018-12-02 ENCOUNTER — Telehealth: Payer: Self-pay | Admitting: Plastic Surgery

## 2018-12-02 NOTE — Telephone Encounter (Signed)

## 2018-12-03 ENCOUNTER — Ambulatory Visit (INDEPENDENT_AMBULATORY_CARE_PROVIDER_SITE_OTHER): Payer: BC Managed Care – PPO | Admitting: General Surgery

## 2018-12-03 ENCOUNTER — Encounter: Payer: Self-pay | Admitting: Physical Therapy

## 2018-12-03 ENCOUNTER — Encounter: Payer: Self-pay | Admitting: Plastic Surgery

## 2018-12-03 ENCOUNTER — Ambulatory Visit (INDEPENDENT_AMBULATORY_CARE_PROVIDER_SITE_OTHER): Payer: BC Managed Care – PPO | Admitting: Plastic Surgery

## 2018-12-03 ENCOUNTER — Encounter: Payer: Self-pay | Admitting: General Surgery

## 2018-12-03 ENCOUNTER — Ambulatory Visit: Payer: BC Managed Care – PPO | Admitting: Physical Therapy

## 2018-12-03 ENCOUNTER — Other Ambulatory Visit: Payer: Self-pay

## 2018-12-03 VITALS — BP 125/87 | HR 63 | Temp 98.1°F | Ht 67.0 in | Wt 184.0 lb

## 2018-12-03 VITALS — BP 140/87 | HR 96 | Temp 97.9°F | Ht 65.0 in | Wt 185.0 lb

## 2018-12-03 DIAGNOSIS — C50212 Malignant neoplasm of upper-inner quadrant of left female breast: Secondary | ICD-10-CM | POA: Diagnosis not present

## 2018-12-03 DIAGNOSIS — M25512 Pain in left shoulder: Secondary | ICD-10-CM

## 2018-12-03 DIAGNOSIS — Z170 Estrogen receptor positive status [ER+]: Secondary | ICD-10-CM | POA: Diagnosis not present

## 2018-12-03 DIAGNOSIS — Z9012 Acquired absence of left breast and nipple: Secondary | ICD-10-CM

## 2018-12-03 DIAGNOSIS — N6310 Unspecified lump in the right breast, unspecified quadrant: Secondary | ICD-10-CM

## 2018-12-03 NOTE — Patient Instructions (Addendum)
Patient is scheduled for right mastectomy on 12/09/2018.  The patient is aware to call back for any questions or concerns.

## 2018-12-03 NOTE — Therapy (Signed)
[facility] [address], [place], [address] Phone: [phone]   Fax:  [phone]  Physical Therapy Treatment  Patient Details  Name: Denise [name] MRN: [id number] Date of Birth: 08/31/1975 Referring Provider (PT): Dillingham   Encounter Date: 12/03/2018  PT End of Session - 12/03/18 1812    Visit Number  11    Number of Visits  17    Date for PT Re-Evaluation  12/18/18    PT Start Time  0530    PT Stop Time  0615    PT Time Calculation (min)  45 min    Activity Tolerance  Patient tolerated treatment well    Behavior During Therapy  [facility] for tasks assessed/performed       Past Medical History:  Diagnosis Date  . Abnormal breast biopsy    PASH, [name]  . Breast cancer ([place]) 11/2017   left breast  . Family history of breast cancer   . Family history of ovarian cancer   . Fibroadenoma of breast, left 2000, 2016  . Genetic screening 08/26/2013   BRCA/BART negative/Myriad, CHEK2 POS 2017  . Increased risk of breast cancer 2017   IBIS=47%  . Left breast lump 06/18/2017   Fluid/drained [name]  . Migraine   . Monoallelic mutation of CHEK2 gene in female patient 2017   increased risk of breast and colon cancer  . Personal history of chemotherapy   . Personal history of radiation therapy     Past Surgical History:  Procedure Laterality Date  . BREAST BIOPSY Bilateral 09/08/14   neg  . BREAST BIOPSY Left 03/16/2015   Procedure: BREAST BIOPSY WITH NEEDLE LOCALIZATION;  Surgeon: [name], MD;  Location: [facility] ORS;  Service: General;  Laterality: Left;  . BREAST BIOPSY Left 11/2017   positive  . BREAST CYST ASPIRATION Left 06/18/2017   cyst aspiration  . BREAST EXCISIONAL BIOPSY Left 09/2014  . BREAST EXCISIONAL BIOPSY Right    age 89's  . BREAST RECONSTRUCTION WITH PLACEMENT OF TISSUE EXPANDER AND FLEX HD (ACELLULAR HYDRATED DERMIS) Left 05/20/2018   Procedure: BREAST  RECONSTRUCTION WITH PLACEMENT OF TISSUE EXPANDER AND FLEX HD (ACELLULAR HYDRATED DERMIS);  Surgeon: [name], DO;  Location: [facility] ORS;  Service: Plastics;  Laterality: Left;  . BREAST SURGERY Right March 2000   benign fibroadenoma  . BREAST SURGERY Left 08/08/13   excision  . BREAST SURGERY Left 09/23/14   excision  . MASTECTOMY Left 05/20/2018  . MASTECTOMY W/ SENTINEL NODE BIOPSY Left 05/20/2018   Procedure: MASTECTOMY WITH SENTINEL LYMPH NODE BIOPSY;  Surgeon: [name], MD;  Location: [facility];  Service: General;  Laterality: Left;  . PORTACATH PLACEMENT Right 12/17/2017   Procedure: INSERTION PORT-A-CATH;  Surgeon: [name], MD;  Location: [facility];  Service: General;  Laterality: Right;    There were no vitals filed for this visit.  Subjective Assessment - 12/03/18 1732    Subjective  Patient reports minimal pain, just some tension at lat and UT. Is prepared for expander placement R breast Monday 6/22    Pertinent History  Patient is a 43 year old female with breast cancer history beginning in 2016 with multiple benign lumpectomies. Patient reports first malignant finding this past June 2019 in L breast, with following masectomy with saline expander placed 05/20/18. Patient reports chemo prior with radiation beginning next week 5days/week, for 6 weeks. Patient reports she is also planning to have R  masectomy with saline expansion in the future, unscheduled. 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 Patient reports she is a mother of a 6 year old and 19 year old that she reports are very involved in extra-curriculars. Patient reports her ROM in the shoulder is improving, but that  she is having some pain in the inside elbow and into medial forearm with straightening. Patient reports some TTP at incision site, but most all pain at elbow into forearm. Worst pain in past week 7/10 and best 0/10.  Reports pain is sharp in the medial elbow and  forearm; denies numbness or tingling    How long can you sit comfortably?  unlimited    How long can you stand comfortably?  unlimited    How long can you walk comfortably?  unlimited    Patient Stated Goals  Decrease pain       Manual STM withtrigger point releaseTo L pec minor/major, latissimus/teres minor,UT, ant and middle deltoid; less time spent on forearm musculature, most spent on UT, pec major/minor (heavy focus on UT d/t increased patient complaint PROM into all planes61mn in flex, abd, IR, and ER inc ROM as able(focus on ER) GHJAPgrade III 30sec bouts 6bouts, and with ER for 6 additional bouts, with flex 6 bouts Cervical traction 10sec traction 10sec release x10  Increased ROM throughout manual techniques   Ther-Ex - Standing rows 25# x10 with good carry over from previous session - Bent over rows 10# x10 with min cuing for set up with good carry over following; 20# KB 2x 10/8 - [employer] 5# DB b/l with cuing for maintained scapular retraction with good carry over following - Goodmorning x10 with cuing for set up with good carry over of form and motor control throughout reps - Goodmorning with 25# plate 3x 10 with cuing initially to maintain scapular retraction with good carry over following                        PT Education - 12/03/18 1805    Education Details  Exercise form    Person(s) Educated  Patient    Methods  Explanation;Demonstration;Tactile cues;Verbal cues    Comprehension  Verbal cues required;Tactile cues required;Returned demonstration;Verbalized understanding       PT Short Term Goals - 06/21/18 1143      PT SHORT TERM GOAL #1   Title  Pt will be independent with HEP in order to improve strength and motion, and to decrease pain in order to improve pain-free function at home and work.    Time  4    Period  Weeks    Status  New        PT Long Term Goals - 10/24/18 1447      PT LONG TERM GOAL #1   Title  Pt  will decrease worst pain as reported on NPRS by at least 3 points in order to demonstrate clinically significant reduction in pain.    Baseline  10/24/18 4/10    Time  8    Period  Weeks    Status  On-going      PT LONG TERM GOAL #2   Title  Patient will increase FOTO score to 72 to demonstrate predicted increase in functional mobility to complete ADLs    Baseline  10/24/18     Time  8    Period  Weeks    Status  Revised  PT LONG TERM GOAL #3   Title  Patient will demonstrate all active, painfree shoulder ROM within normal limits in order to complete ADLs    Baseline  10/24/18 flex: 150d with "stretch pain" at axilla Abd: 138d with pain directly under breast; IR T10; ER C8    Time  8    Period  Weeks    Status  On-going            Plan - 12/03/18 1826    Clinical Impression Statement  PT continued therex progression with good form following PT cuing and demo. Pt is prepared for expander placement in R breast Monday, and is taking a hiatus from therapy during recovery. PT will begin progression on RUE, maintaining motion and strength in LUE.    Rehab Potential  Good    Clinical Impairments Affecting Rehab Potential  (+) age, motivation, social support, (-) current sedentary lifestyle, ongoing cancer treatment, other comorbidities    PT Frequency  2x / week    PT Duration  8 weeks    PT Treatment/Interventions  ADLs/Self Care Home Management;Aquatic Therapy;Electrical Stimulation;Cryotherapy;Moist Heat;Iontophoresis 91m/ml Dexamethasone;Functional mobility training;Therapeutic activities;Therapeutic exercise;Traction;Ultrasound;Neuromuscular re-education;Manual techniques;Patient/family education;Manual lymph drainage;Passive range of motion;Dry needling;Energy conservation;Taping    PT Next Visit Plan  Ease soft tissue restrictions, increase ROM as able    PT Home Exercise Plan  pulleys shoulder abd/flex, sidelying lat stretch, supine ER stretch, scar massage    Consulted and Agree  with Plan of Care  Patient       Patient will benefit from skilled therapeutic intervention in order to improve the following deficits and impairments:  Decreased activity tolerance, Decreased endurance, Decreased range of motion, Decreased skin integrity, Hypomobility, Increased fascial restricitons, Impaired UE functional use, Improper body mechanics, Pain, Postural dysfunction, Impaired flexibility, Decreased scar mobility  Visit Diagnosis: 1. Acute pain of left shoulder        Problem List Patient Active Problem List   Diagnosis Date Noted  . Acquired absence of left breast 09/03/2018  . Visual changes 08/20/2018  . Breast asymmetry following reconstructive surgery 08/13/2018  . Hot flashes due to menopause 06/18/2018  . Status post left mastectomy 06/04/2018  . S/P breast reconstruction 05/28/2018  . Vasomotor symptoms due to menopause 04/17/2018  . Elevated LFTs 03/17/2018  . Goals of care, counseling/discussion 03/17/2018  . Hypomagnesemia 03/04/2018  . Diarrhea 01/02/2018  . Encounter for antineoplastic chemotherapy 12/21/2017  . Encounter for antineoplastic immunotherapy 12/21/2017  . 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 Breast mass, right 12/05/2016  . Monoallelic mutation of CHEK2 gene in female patient 06/20/2015  . [place]outer [place]17[place]   CShelton SilvasPT, DPT CShelton Silvas6/16/2020, 6:56 PM  [facility] [address] [place] [address]Phone: [phone]  Fax:  [phone] Name: EANIJAH SPOHRMRN: [id number] of Birth: 1October 12, 1977

## 2018-12-03 NOTE — Progress Notes (Signed)
Patient ID: Denise [name], female   DOB: [birth year], 43 y.o.   MRN: [id number]  Chief Complaint  Patient presents with  . Pre-op Exam    HPI Denise [name] is a 43 y.o. female here today for her pre op right prophylactic masctectomy scheduled on 12/09/2018.  Immediate reconstruction is planned. HPI  Past Medical History:  Diagnosis Date  . Abnormal breast biopsy    PASH, Dr. Lemar Livings  . Breast cancer ([facility]) 11/2017   left breast  . Family history of breast cancer   . Family history of ovarian cancer   . Fibroadenoma of breast, left 2000, 2016  . Genetic screening 08/26/2013   BRCA/BART negative/Myriad, CHEK2 POS 2017  . Increased risk of breast cancer 2017   IBIS=47%  . Left breast lump 06/18/2017   Fluid/drained J Dontel Harshberger  . Migraine   . Monoallelic mutation of CHEK2 gene in female patient 2017   increased risk of breast and colon cancer  . Personal history of chemotherapy   . Personal history of radiation therapy     Past Surgical History:  Procedure Laterality Date  . BREAST BIOPSY Bilateral 09/08/14   neg  . BREAST BIOPSY Left 03/16/2015   Procedure: BREAST BIOPSY WITH NEEDLE LOCALIZATION;  Surgeon: Earline Mayotte, MD;  Location: [facility];  Service: General;  Laterality: Left;  . BREAST BIOPSY Left 11/2017   positive  . BREAST CYST ASPIRATION Left 06/18/2017   cyst aspiration  . BREAST EXCISIONAL BIOPSY Left 09/2014  . BREAST EXCISIONAL BIOPSY Right    age 58's  . BREAST RECONSTRUCTION WITH PLACEMENT OF TISSUE EXPANDER AND FLEX HD (ACELLULAR HYDRATED DERMIS) Left 05/20/2018   Procedure: BREAST RECONSTRUCTION WITH PLACEMENT OF TISSUE EXPANDER AND FLEX HD (ACELLULAR HYDRATED DERMIS);  Surgeon: Peggye Form, DO;  Location: [facility];  Service: Plastics;  Laterality: Left;  . BREAST SURGERY Right March 2000   benign fibroadenoma  . BREAST SURGERY Left 08/08/13   excision  . BREAST SURGERY Left 09/23/14   excision  . MASTECTOMY Left 05/20/2018  . MASTECTOMY  W/ SENTINEL NODE BIOPSY Left 05/20/2018   Procedure: MASTECTOMY WITH SENTINEL LYMPH NODE BIOPSY;  Surgeon: Earline Mayotte, MD;  Location: [facility];  Service: General;  Laterality: Left;  . PORTACATH PLACEMENT Right 12/17/2017   Procedure: INSERTION PORT-A-CATH;  Surgeon: Earline Mayotte, MD;  Location: [facility];  Service: General;  Laterality: Right;    Family History  Problem Relation Age of Onset  . Prostate cancer Father 74  . Breast cancer Paternal Grandmother 35  . Breast cancer Maternal Aunt 60  . Ovarian cancer Maternal Aunt 70  . Breast cancer Paternal Aunt 31  . Pancreatic cancer Maternal Aunt 92    Social History Social History   Tobacco Use  . Smoking status: Never Smoker  . Smokeless tobacco: Never Used  Substance Use Topics  . Alcohol use: Yes    Comment: occasionally  . Drug use: No    Allergies  Allergen Reactions  . Steri-Strip Compound Benzoin [Benzoin Compound]     Steri-strip ,   . Tape Other (See Comments)    Minor skin irritation     Current Outpatient Medications  Medication Sig Dispense Refill  . acetaminophen (TYLENOL) 500 MG tablet Take 500 mg by mouth every 6 (six) hours as needed for moderate pain or headache.    . diazepam (VALIUM) 2 MG tablet Take 1 tablet (2 mg total) by mouth every 6 (six) hours as needed for  anxiety. (Patient not taking: Reported on 12/04/2018) 30 tablet 0  . HYDROcodone-acetaminophen (NORCO) 5-325 MG tablet Take 1 tablet by mouth every 8 (eight) hours as needed for moderate pain. 20 tablet 0  . lidocaine-prilocaine (EMLA) cream Apply 1 application topically as needed (port access).    . magnesium oxide (MAG-OX) 400 MG tablet Take 1 tablet (400 mg total) by mouth daily. (Patient taking differently: Take 400 mg by mouth 2 (two) times a week. ) 30 tablet 0  . omeprazole (PRILOSEC OTC) 20 MG tablet Take 20 mg by mouth daily as needed (for acid reflux).    . tamoxifen (NOLVADEX) 20 MG tablet Take 1 tablet (20 mg total) by  mouth daily. 30 tablet 3  . venlafaxine XR (EFFEXOR-XR) 37.5 MG 24 hr capsule TAKE 1 CAPSULE BY MOUTH ONCE DAILY WITH BREAKFAST (Patient not taking: Reported on 12/04/2018) 90 capsule 0  . vitamin C (ASCORBIC ACID) 500 MG tablet Take 500 mg by mouth every other day.     No current facility-administered medications for this visit.    Facility-Administered Medications Ordered in Other Visits  Medication Dose Route Frequency Provider Last Rate Last Dose  . sodium chloride flush (NS) 0.9 % injection 10 mL  10 mL Intravenous PRN [name], MD   10 mL at 04/17/18 0844    Review of Systems Review of Systems  Constitutional: Negative.   Respiratory: Negative.   Cardiovascular: Negative.     Blood pressure 140/87, pulse 96, temperature 97.9 F (36.6 C), temperature source Skin, height 5\' 5"  (1.651 m), weight 185 lb (83.9 kg), last menstrual period 11/06/2018, SpO2 98 %.  Physical Exam Physical Exam Exam conducted with a chaperone present.  Constitutional:      Appearance: She is well-developed.  Eyes:     General: No scleral icterus.    Conjunctiva/sclera: Conjunctivae normal.  Neck:     Musculoskeletal: Neck supple.  Cardiovascular:     Rate and Rhythm: Normal rate and regular rhythm.     Heart sounds: Normal heart sounds.  Pulmonary:     Effort: Pulmonary effort is normal.     Breath sounds: Normal breath sounds.  Chest:     Breasts:        Right: Normal.     Lymphadenopathy:     Cervical: No cervical adenopathy.     Upper Body:     Right upper body: No supraclavicular or axillary adenopathy.     Left upper body: No supraclavicular or axillary adenopathy.  Skin:    General: Skin is warm and dry.  Neurological:     Mental Status: She is alert and oriented to person, place, and time.  Psychiatric:        Behavior: Behavior normal.     Assessment No contraindication to planned right prophylactic mastectomy with immediate reconstruction.  Plan  Patient is  scheduled right mastectomy on 12/09/2018.   HPI, Physical Exam, Assessment and Plan have been scribed under the direction and in the presence of Donnalee Curry, MD.  Ples Specter, CMA  I have completed the exam and reviewed the above documentation for accuracy and completeness.  I agree with the above.  Museum/gallery conservator has been used and any errors in dictation or transcription are unintentional.  Donnalee Curry, M.D., F.A.C.S.  HPI, Physical Exam, Assessment and Plan have been scribed under the direction and in the presence of Donnalee Curry, MD.  Ples Specter, CMA  Denise [name] 12/04/2018, 1:11 PM

## 2018-12-03 NOTE — H&P (View-Only) (Signed)
Patient ID: Denise [name], female    DOB: [birth year], 43 y.o.   MRN: [id number]   Chief Complaint  Patient presents with  . Breast Problem    The patient is a 43 year old female here for her preoperative history and physical for her right breast surgery.  She she underwent a work-up 1 year ago which she resulted in a left mastectomy with expander placement for invasive ductal carcinoma, estrogen and progesterone positive, HER-2/neu positive and Ki67 5%.  She has been doing great with the expansion on the left side.  She is now planning on having a right mastectomy.  In June 2019 she was seen to have a papilloma of the right breast and has decided on having it removed.  Due to the epidemic things were put on hold.  She is now ready for the surgery and not had any recent illnesses.   Review of Systems  Constitutional: Negative.  Negative for activity change and appetite change.  HENT: Negative.   Eyes: Negative.   Respiratory: Negative.  Negative for chest tightness and shortness of breath.   Cardiovascular: Negative.  Negative for leg swelling.  Gastrointestinal: Negative.  Negative for abdominal distention and abdominal pain.  Endocrine: Negative.   Genitourinary: Negative.   Musculoskeletal: Negative.   Hematological: Negative.   Psychiatric/Behavioral: Negative.     Past Medical History:  Diagnosis Date  . Abnormal breast biopsy    PASH, [name]  . Breast cancer ([place]) 11/2017   left breast  . Family history of breast cancer   . Family history of ovarian cancer   . Fibroadenoma of breast, left 2000, 2016  . Genetic screening 08/26/2013   BRCA/BART negative/Myriad, CHEK2 POS 2017  . Increased risk of breast cancer 2017   IBIS=47%  . Left breast lump 06/18/2017   Fluid/drained [name]  . Migraine   . Monoallelic mutation of CHEK2 gene in female patient 2017   increased risk of breast and colon cancer  . Personal history of chemotherapy   . Personal history of  radiation therapy     Past Surgical History:  Procedure Laterality Date  . BREAST BIOPSY Bilateral 09/08/14   neg  . BREAST BIOPSY Left 03/16/2015   Procedure: BREAST BIOPSY WITH NEEDLE LOCALIZATION;  Surgeon: [name], MD;  Location: [facility];  Service: General;  Laterality: Left;  . BREAST BIOPSY Left 11/2017   positive  . BREAST CYST ASPIRATION Left 06/18/2017   cyst aspiration  . BREAST EXCISIONAL BIOPSY Left 09/2014  . BREAST EXCISIONAL BIOPSY Right    age 79's  . BREAST RECONSTRUCTION WITH PLACEMENT OF TISSUE EXPANDER AND FLEX HD (ACELLULAR HYDRATED DERMIS) Left 05/20/2018   Procedure: BREAST RECONSTRUCTION WITH PLACEMENT OF TISSUE EXPANDER AND FLEX HD (ACELLULAR HYDRATED DERMIS);  Surgeon: [name], DO;  Location: [facility];  Service: Plastics;  Laterality: Left;  . BREAST SURGERY Right March 2000   benign fibroadenoma  . BREAST SURGERY Left 08/08/13   excision  . BREAST SURGERY Left 09/23/14   excision  . MASTECTOMY Left 05/20/2018  . MASTECTOMY W/ SENTINEL NODE BIOPSY Left 05/20/2018   Procedure: MASTECTOMY WITH SENTINEL LYMPH NODE BIOPSY;  Surgeon: [name], MD;  Location: [facility];  Service: General;  Laterality: Left;  . PORTACATH PLACEMENT Right 12/17/2017   Procedure: INSERTION PORT-A-CATH;  Surgeon: [name], MD;  Location: [facility];  Service: General;  Laterality: Right;      Current Outpatient Medications:  .  acetaminophen (TYLENOL) 500 MG tablet, Take 500 mg by mouth every 6 (six) hours as needed for moderate pain or headache., Disp: , Rfl:  .  diazepam (VALIUM) 2 MG tablet, Take 1 tablet (2 mg total) by mouth every 6 (six) hours as needed for anxiety. (Patient not taking: Reported on 11/21/2018), Disp: 30 tablet, Rfl: 0 .  HYDROcodone-acetaminophen (NORCO) 5-325 MG tablet, Take 1 tablet by mouth every 8 (eight) hours as needed for moderate pain. (Patient not taking: Reported on 09/04/2018), Disp: 20 tablet, Rfl: 0 .   lidocaine-prilocaine (EMLA) cream, Apply 1 application topically as needed (port access)., Disp: , Rfl:  .  magnesium oxide (MAG-OX) 400 MG tablet, Take 1 tablet (400 mg total) by mouth daily. (Patient taking differently: Take 400 mg by mouth 2 (two) times a week. ), Disp: 30 tablet, Rfl: 0 .  omeprazole (PRILOSEC OTC) 20 MG tablet, Take 20 mg by mouth daily as needed (for acid reflux)., Disp: , Rfl:  .  tamoxifen (NOLVADEX) 20 MG tablet, Take 1 tablet (20 mg total) by mouth daily., Disp: 30 tablet, Rfl: 3 .  venlafaxine XR (EFFEXOR-XR) 37.5 MG 24 hr capsule, TAKE 1 CAPSULE BY MOUTH ONCE DAILY WITH BREAKFAST (Patient not taking: No sig reported), Disp: 90 capsule, Rfl: 0 .  vitamin C (ASCORBIC ACID) 500 MG tablet, Take 500 mg by mouth every other day., Disp: , Rfl:  No current facility-administered medications for this visit.   Facility-Administered Medications Ordered in Other Visits:  .  sodium chloride flush (NS) 0.9 % injection 10 mL, 10 mL, Intravenous, PRN, Mike Gip, Melissa C, MD, 10 mL at 04/17/18 0844   Objective:   Vitals:   12/03/18 1102  BP: 125/87  Pulse: 63  Temp: 98.1 F (36.7 C)  SpO2: 99%    Physical Exam Vitals signs and nursing note reviewed.  Constitutional:      Appearance: Normal appearance.  HENT:     Head: Normocephalic and atraumatic.  Cardiovascular:     Rate and Rhythm: Normal rate.  Pulmonary:     Effort: Pulmonary effort is normal. No respiratory distress.     Breath sounds: No wheezing.  Abdominal:     General: Abdomen is flat. There is no distension.     Tenderness: There is no abdominal tenderness.  Skin:    General: Skin is warm.  Neurological:     General: No focal deficit present.     Mental Status: She is oriented to person, place, and time.  Psychiatric:        Mood and Affect: Mood normal.        Behavior: Behavior normal.        Thought Content: Thought content normal.     Assessment & Plan:  Breast mass, right  Acquired  absence of left breast  Plan for right mastectomy immediate reconstruction with expander and Flex HD placement.  The risks that can be encountered with and after placement of a breast expander placement were discussed and include the following but not limited to these: bleeding, infection, delayed healing, anesthesia risks, skin sensation changes, injury to structures including nerves, blood vessels, and muscles which may be temporary or permanent, allergies to tape, suture materials and glues, blood products, topical preparations or injected agents, skin contour irregularities, skin discoloration and swelling, deep vein thrombosis, cardiac and pulmonary complications, pain, which may persist, fluid accumulation, wrinkling of the skin over the expander, changes in nipple or breast sensation, expander leakage or rupture, faulty position of the  expander, persistent pain, formation of tight scar tissue around the expander (capsular contracture), possible need for revisional surgery or staged procedures.   Patient states she already has her prescriptions and does not need any new ones.  [place], DO

## 2018-12-03 NOTE — Progress Notes (Signed)
Patient ID: Denise [name], female    DOB: [birth year], 43 y.o.   MRN: [id number]   Chief Complaint  Patient presents with  . Breast Problem    The patient is a 43 year old female here for her preoperative history and physical for her right breast surgery.  She she underwent a work-up 1 year ago which she resulted in a left mastectomy with expander placement for invasive ductal carcinoma, estrogen and progesterone positive, HER-2/neu positive and Ki67 5%.  She has been doing great with the expansion on the left side.  She is now planning on having a right mastectomy.  In June 2019 she was seen to have a papilloma of the right breast and has decided on having it removed.  Due to the epidemic things were put on hold.  She is now ready for the surgery and not had any recent illnesses.   Review of Systems  Constitutional: Negative.  Negative for activity change and appetite change.  HENT: Negative.   Eyes: Negative.   Respiratory: Negative.  Negative for chest tightness and shortness of breath.   Cardiovascular: Negative.  Negative for leg swelling.  Gastrointestinal: Negative.  Negative for abdominal distention and abdominal pain.  Endocrine: Negative.   Genitourinary: Negative.   Musculoskeletal: Negative.   Hematological: Negative.   Psychiatric/Behavioral: Negative.     Past Medical History:  Diagnosis Date  . Abnormal breast biopsy    PASH, [name]  . Breast cancer ([place]) 11/2017   left breast  . Family history of breast cancer   . Family history of ovarian cancer   . Fibroadenoma of breast, left 2000, 2016  . Genetic screening 08/26/2013   BRCA/BART negative/Myriad, CHEK2 POS 2017  . Increased risk of breast cancer 2017   IBIS=47%  . Left breast lump 06/18/2017   Fluid/drained [name]  . Migraine   . Monoallelic mutation of CHEK2 gene in female patient 2017   increased risk of breast and colon cancer  . Personal history of chemotherapy   . Personal history of  radiation therapy     Past Surgical History:  Procedure Laterality Date  . BREAST BIOPSY Bilateral 09/08/14   neg  . BREAST BIOPSY Left 03/16/2015   Procedure: BREAST BIOPSY WITH NEEDLE LOCALIZATION;  Surgeon: [name], MD;  Location: [facility] ORS;  Service: General;  Laterality: Left;  . BREAST BIOPSY Left 11/2017   positive  . BREAST CYST ASPIRATION Left 06/18/2017   cyst aspiration  . BREAST EXCISIONAL BIOPSY Left 09/2014  . BREAST EXCISIONAL BIOPSY Right    age 20's  . BREAST RECONSTRUCTION WITH PLACEMENT OF TISSUE EXPANDER AND FLEX HD (ACELLULAR HYDRATED DERMIS) Left 05/20/2018   Procedure: BREAST RECONSTRUCTION WITH PLACEMENT OF TISSUE EXPANDER AND FLEX HD (ACELLULAR HYDRATED DERMIS);  Surgeon: [name], DO;  Location: [facility] OR[facility];  Service: Plastics;  Laterality: Left;  . BREAST SURGERY Right March 2000   benign fibroadenoma  . BREAST SURGERY Left 08/08/13   excision  . BREAST SURGERY Left 09/23/14   excision  . MASTECTOMY Left 05/20/2018  . MASTECTOMY W/ SENTINEL NODE BIOPSY Left 05/20/2018   Procedure: MASTECTOMY WITH SENTINEL LYMPH NODE BIOPSY;  Surgeon: [name], MD;  Location: [facility];  Service: General;  Laterality: Left;  . PORTACATH PLACEMENT Right 12/17/2017   Procedure: INSERTION PORT-A-CATH;  Surgeon: [name], MD;  Location: [facility];  Service: General;  Laterality: Right;      Current Outpatient Medications:  .  acetaminophen (TYLENOL) 500 MG tablet, Take 500 mg by mouth every 6 (six) hours as needed for moderate pain or headache., Disp: , Rfl:  .  diazepam (VALIUM) 2 MG tablet, Take 1 tablet (2 mg total) by mouth every 6 (six) hours as needed for anxiety. (Patient not taking: Reported on 11/21/2018), Disp: 30 tablet, Rfl: 0 .  HYDROcodone-acetaminophen (NORCO) 5-325 MG tablet, Take 1 tablet by mouth every 8 (eight) hours as needed for moderate pain. (Patient not taking: Reported on 09/04/2018), Disp: 20 tablet, Rfl: 0 .   lidocaine-prilocaine (EMLA) cream, Apply 1 application topically as needed (port access)., Disp: , Rfl:  .  magnesium oxide (MAG-OX) 400 MG tablet, Take 1 tablet (400 mg total) by mouth daily. (Patient taking differently: Take 400 mg by mouth 2 (two) times a week. ), Disp: 30 tablet, Rfl: 0 .  omeprazole (PRILOSEC OTC) 20 MG tablet, Take 20 mg by mouth daily as needed (for acid reflux)., Disp: , Rfl:  .  tamoxifen (NOLVADEX) 20 MG tablet, Take 1 tablet (20 mg total) by mouth daily., Disp: 30 tablet, Rfl: 3 .  venlafaxine XR (EFFEXOR-XR) 37.5 MG 24 hr capsule, TAKE 1 CAPSULE BY MOUTH ONCE DAILY WITH BREAKFAST (Patient not taking: No sig reported), Disp: 90 capsule, Rfl: 0 .  vitamin C (ASCORBIC ACID) 500 MG tablet, Take 500 mg by mouth every other day., Disp: , Rfl:  No current facility-administered medications for this visit.   Facility-Administered Medications Ordered in Other Visits:  .  sodium chloride flush (NS) 0.9 % injection 10 mL, 10 mL, Intravenous, PRN, Mike Gip, Melissa C, MD, 10 mL at 04/17/18 0844   Objective:   Vitals:   12/03/18 1102  BP: 125/87  Pulse: 63  Temp: 98.1 F (36.7 C)  SpO2: 99%    Physical Exam Vitals signs and nursing note reviewed.  Constitutional:      Appearance: Normal appearance.  HENT:     Head: Normocephalic and atraumatic.  Cardiovascular:     Rate and Rhythm: Normal rate.  Pulmonary:     Effort: Pulmonary effort is normal. No respiratory distress.     Breath sounds: No wheezing.  Abdominal:     General: Abdomen is flat. There is no distension.     Tenderness: There is no abdominal tenderness.  Skin:    General: Skin is warm.  Neurological:     General: No focal deficit present.     Mental Status: She is oriented to person, place, and time.  Psychiatric:        Mood and Affect: Mood normal.        Behavior: Behavior normal.        Thought Content: Thought content normal.     Assessment & Plan:  Breast mass, right  Acquired  absence of left breast  Plan for right mastectomy immediate reconstruction with expander and Flex HD placement.  The risks that can be encountered with and after placement of a breast expander placement were discussed and include the following but not limited to these: bleeding, infection, delayed healing, anesthesia risks, skin sensation changes, injury to structures including nerves, blood vessels, and muscles which may be temporary or permanent, allergies to tape, suture materials and glues, blood products, topical preparations or injected agents, skin contour irregularities, skin discoloration and swelling, deep vein thrombosis, cardiac and pulmonary complications, pain, which may persist, fluid accumulation, wrinkling of the skin over the expander, changes in nipple or breast sensation, expander leakage or rupture, faulty position of the  expander, persistent pain, formation of tight scar tissue around the expander (capsular contracture), possible need for revisional surgery or staged procedures.   Patient states she already has her prescriptions and does not need any new ones.  [place], DO

## 2018-12-04 ENCOUNTER — Ambulatory Visit (INDEPENDENT_AMBULATORY_CARE_PROVIDER_SITE_OTHER): Payer: BC Managed Care – PPO | Admitting: Obstetrics and Gynecology

## 2018-12-04 ENCOUNTER — Encounter
Admission: RE | Admit: 2018-12-04 | Discharge: 2018-12-04 | Disposition: A | Discharge status: Home / Self Care | Payer: BC Managed Care – PPO | Source: Ambulatory Visit | Attending: General Surgery | Admitting: General Surgery

## 2018-12-04 ENCOUNTER — Encounter: Payer: Self-pay | Admitting: Obstetrics and Gynecology

## 2018-12-04 ENCOUNTER — Other Ambulatory Visit (HOSPITAL_COMMUNITY)
Admission: RE | Admit: 2018-12-04 | Discharge: 2018-12-04 | Disposition: A | Discharge status: Home / Self Care | Payer: BC Managed Care – PPO | Source: Ambulatory Visit | Attending: Obstetrics and Gynecology | Admitting: Obstetrics and Gynecology

## 2018-12-04 VITALS — BP 118/88 | Ht 67.0 in | Wt 185.0 lb

## 2018-12-04 DIAGNOSIS — C50419 Malignant neoplasm of upper-outer quadrant of unspecified female breast: Secondary | ICD-10-CM

## 2018-12-04 DIAGNOSIS — Z01812 Encounter for preprocedural laboratory examination: Secondary | ICD-10-CM | POA: Diagnosis not present

## 2018-12-04 DIAGNOSIS — Z1589 Genetic susceptibility to other disease: Secondary | ICD-10-CM

## 2018-12-04 DIAGNOSIS — Z124 Encounter for screening for malignant neoplasm of cervix: Secondary | ICD-10-CM | POA: Insufficient documentation

## 2018-12-04 DIAGNOSIS — Z3009 Encounter for other general counseling and advice on contraception: Secondary | ICD-10-CM

## 2018-12-04 DIAGNOSIS — Z79810 Long term (current) use of selective estrogen receptor modulators (SERMs): Secondary | ICD-10-CM | POA: Insufficient documentation

## 2018-12-04 DIAGNOSIS — Z01419 Encounter for gynecological examination (general) (routine) without abnormal findings: Secondary | ICD-10-CM | POA: Diagnosis not present

## 2018-12-04 DIAGNOSIS — Z1501 Genetic susceptibility to malignant neoplasm of breast: Secondary | ICD-10-CM

## 2018-12-04 DIAGNOSIS — Z1239 Encounter for other screening for malignant neoplasm of breast: Secondary | ICD-10-CM

## 2018-12-04 NOTE — Patient Instructions (Signed)
Your procedure is scheduled on: 12-09-18 MONDAY Report to Same Day Surgery 2nd floor medical mall [facility] Entrance-take elevator on left to 2nd floor.  Check in with surgery information desk.) To find out your arrival time please call [phone] between 1PM - 3PM on 6- 19-20 FRIDAY  Remember: Instructions that are not followed completely may result in serious medical risk, up to and including death, or upon the discretion of your surgeon and anesthesiologist your surgery may need to be rescheduled.    _x___ 1. Do not eat food after midnight the night before your procedure. NO GUM OR CANDY AFTER MIDNIGHT. You may drink clear liquids up to 2 hours before you are scheduled to arrive at the hospital for your procedure.  Do not drink clear liquids within 2 hours of your scheduled arrival to the hospital.  Clear liquids include  --Water or Apple juice without pulp  --Clear carbohydrate beverage such as ClearFast or Gatorade  --Black Coffee or Clear Tea (No milk, no creamers, do not add anything to the coffee or Tea   ____Ensure clear carbohydrate drink on the way to the hospital for bariatric patients  ____Ensure clear carbohydrate drink 3 hours before surgery for Dr Dwyane Luo patients if physician instructed.    __x__ 2. No Alcohol for 24 hours before or after surgery.   __x__3. No Smoking or e-cigarettes for 24 prior to surgery.  Do not use any chewable tobacco products for at least 6 hour prior to surgery   ____  4. Bring all medications with you on the day of surgery if instructed.    __x__ 5. Notify your doctor if there is any change in your medical condition     (cold, fever, infections).    x___6. On the morning of surgery brush your teeth with toothpaste and water.  You may rinse your mouth with mouth wash if you wish.  Do not swallow any toothpaste or mouthwash.   Do not wear jewelry, make-up, hairpins, clips or nail polish.  Do not wear lotions, powders, or perfumes. You  may wear deodorant.  Do not shave 48 hours prior to surgery. Men may shave face and neck.  Do not bring valuables to the hospital.    [facility] is not responsible for any belongings or valuables.               Contacts, dentures or bridgework may not be worn into surgery.  Leave your suitcase in the car. After surgery it may be brought to your room.  For patients admitted to the hospital, discharge time is determined by your treatment team.  _  Patients discharged the day of surgery will not be allowed to drive home.  You will need someone to drive you home and stay with you the night of your procedure.    Please read over the following fact sheets that you were given:   [facility] Preparing for Surgery  _x___ TAKE THE FOLLOWING MEDICATION THE MORNING OF SURGERY WITH A SMALL SIP OF WATER. These include:  1. PRILOSEC (OMEPRAZOLE)  2. TAKE A PRILOSEC THE NIGHT BEFORE YOUR SURGERY  3.  4.  5.  6.  ____Fleets enema or Magnesium Citrate as directed.   _x___ Use CHG Soap or sage wipes as directed on instruction sheet   ____ Use inhalers on the day of surgery and bring to hospital day of surgery  ____ Stop Metformin and Janumet 2 days prior to surgery.    ____ Take 1/2  of usual insulin dose the night before surgery and none on the morning surgery.   ____ Follow recommendations from Cardiologist, Pulmonologist or PCP regarding stopping Aspirin, Coumadin, Plavix ,Eliquis, Effient, or Pradaxa, and Pletal.  X____Stop Anti-inflammatories such as Advil, Aleve, Ibuprofen, Motrin, Naproxen, Naprosyn, Goodies powders or aspirin products NOW-OK to take Tylenol    ____ Stop supplements until after surgery.     ____ Bring C-Pap to the hospital.

## 2018-12-04 NOTE — Progress Notes (Signed)
PCP:  [name], PA-C   Chief Complaint  Patient presents with  . Gynecologic Exam    HPI:      Denise [name] is a 43 y.o. G2P2 who LMP was Patient's last menstrual period was 11/07/2018 (exact date)., presents today for her annual examination.  Her menses had been absent due to chemo but she has had 2 cycles recently, 1 month apart. Lasting 6-7 days. First one was heavy and with mod dysmen, but 2nd one was lighter and less painful. Changing pad/tampons Q4 hrs on heavy days. No BTB. On tamoxifen.   Sex activity: single partner, contraception - on tamoxifen. Using condoms. Considering copper IUD. Last Pap: July 14, 2015  Results were: no abnormalities /neg HPV DNA  Hx of STDs: none  Last mammogram: 08/21/18 RT breast.  Results were: normal--routine follow-up in 12 months. Pt will no longer need mammos, just CBE. Hx of LT breast cancer 2019 (triple positive) with chemo tx and mastectomy. Now doing tamoxifen and a few more cycles of chemo for positive sentinel node on exc after chemo; completed axillary radiation. 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  Having RT breast prophylactic mastectomy with reconstruction 12/09/18.  There is a FH of breast cancer in her mat aunt, pat aunt and PGM. There is a FH of ovarian cancer in her mat aunt, and now pancreatic cancer in a 2rd mat aunt. Pt's father diagnosed this yr with prostate cancer, requiring treatment. 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 CHEK2 is increased risk of breast and colon cancer. Pt states her mom doesn't have genetic mutation after testing. Her father was tested but they don't have results. No FH colon cancer.  Tobacco use: The patient denies current or previous tobacco use. Alcohol use: rare No drug use.  Exercise: min active  She does get adequate calcium but not Vitamin D in her diet. Labs with PCP  Past Medical History:  Diagnosis Date  . Abnormal  breast biopsy    PASH, [name]  . Breast cancer ([place]) 11/2017   left breast; ER/PR/Her2neu POS  . Family history of breast cancer   . Family history of ovarian cancer   . Fibroadenoma of breast, left 2000, 2016  . Genetic screening 08/26/2013   BRCA/BART negative/[facility]riad, CHEK2 POS 2017  . Increased risk of breast cancer 2017   IBIS=47%  . Left breast lump 06/18/2017   Fluid/drained [name]  . Migraine   . Monoallelic mutation of CHEK2 gene in female patient 2017   increased risk of breast and colon cancer  . Personal history of chemotherapy   . Personal history of radiation therapy     Past Surgical History:  Procedure Laterality Date  . BREAST BIOPSY Bilateral 09/08/14   neg  . BREAST BIOPSY Left 03/16/2015   Procedure: BREAST BIOPSY WITH NEEDLE LOCALIZATION;  Surgeon: [name], MD;  Location: [facility] ORS;  Service: General;  Laterality: Left;  . BREAST BIOPSY Left 11/2017   positive  . BREAST CYST ASPIRATION Left 06/18/2017   cyst aspiration  . BREAST EXCISIONAL BIOPSY Left 09/2014  . BREAST EXCISIONAL BIOPSY Right    age 65's  . BREAST RECONSTRUCTION WITH PLACEMENT OF TISSUE EXPANDER AND FLEX HD (ACELLULAR HYDRATED DERMIS) Left 05/20/2018   Procedure: BREAST RECONSTRUCTION WITH PLACEMENT OF TISSUE EXPANDER AND FLEX HD (ACELLULAR HYDRATED DERMIS);  Surgeon: [name], DO;  Location: [facility] ORS;  Service:  Plastics;  Laterality: Left;  . BREAST SURGERY Right March 2000   benign fibroadenoma  . BREAST SURGERY Left 08/08/13   excision  . BREAST SURGERY Left 09/23/14   excision  . MASTECTOMY Left 05/20/2018  . MASTECTOMY W/ SENTINEL NODE BIOPSY Left 05/20/2018   Procedure: MASTECTOMY WITH SENTINEL LYMPH NODE BIOPSY;  Surgeon: [name], MD;  Location: [facility] OR[facility];  Service: General;  Laterality: Left;  . PORTACATH PLACEMENT Right 12/17/2017   Procedure: INSERTION PORT-A-CATH;  Surgeon: [name], MD;  Location: [facility] ORS;  Service: General;   Laterality: Right;    Family History  Problem Relation Age of Onset  . Prostate cancer Father 97       requiring tx/hormones  . Breast cancer Paternal Grandmother 43  . Breast cancer Maternal Aunt 60  . Ovarian cancer Maternal Aunt 70  . Breast cancer Paternal Aunt 23  . Pancreatic cancer Maternal Aunt 75    Social History   Socioeconomic History  . Marital status: Married    Spouse name: Not on file  . Number of children: Not on file  . Years of education: Not on file  . Highest education level: Not on file  Occupational History  . Not on file  Social Needs  . Financial resource strain: Not on file  . Food insecurity    Worry: Not on file    Inability: Not on file  . Transportation needs    Medical: Not on file    Non-medical: Not on file  Tobacco Use  . Smoking status: Never Smoker  . Smokeless tobacco: Never Used  Substance and Sexual Activity  . Alcohol use: Yes    Comment: occasionally  . Drug use: No  . Sexual activity: Yes    Birth control/protection: Condom  Lifestyle  . Physical activity    Days per week: 3 days    Minutes per session: 60 min  . Stress: Only a little  Relationships  . Social Herbalist on phone: Twice a week    Gets together: Once a week    Attends religious service: More than 4 times per year    Active member of club or organization: No    Attends meetings of clubs or organizations: Never    Relationship status: Married  . Intimate partner violence    Fear of current or ex partner: No    Emotionally abused: No    Physically abused: No    Forced sexual activity: No  Other Topics Concern  . Not on file  Social History Narrative  . Not on file    Current Meds  Medication Sig  . acetaminophen (TYLENOL) 500 MG tablet Take 500 mg by mouth every 6 (six) hours as needed for moderate pain or headache.  . lidocaine-prilocaine (EMLA) cream Apply 1 application topically as needed (port access).  . magnesium oxide (MAG-OX)  400 MG tablet Take 1 tablet (400 mg total) by mouth daily. (Patient taking differently: Take 400 mg by mouth 2 (two) times a week. )  . omeprazole (PRILOSEC OTC) 20 MG tablet Take 20 mg by mouth daily as needed (for acid reflux).  . tamoxifen (NOLVADEX) 20 MG tablet Take 1 tablet (20 mg total) by mouth daily.     ROS:  Review of Systems  Constitutional: Negative for fatigue, fever and unexpected weight change.  Respiratory: Negative for cough, shortness of breath and wheezing.   Cardiovascular: Negative for chest pain, palpitations and leg swelling.  Gastrointestinal: Negative for blood in stool, constipation, diarrhea, nausea and vomiting.  Endocrine: Negative for cold intolerance, heat intolerance and polyuria.  Genitourinary: Negative for dyspareunia, dysuria, flank pain, frequency, genital sores, hematuria, menstrual problem, pelvic pain, urgency, vaginal bleeding, vaginal discharge and vaginal pain.  Musculoskeletal: Negative for back pain, joint swelling and myalgias.  Skin: Negative for rash.  Neurological: Negative for dizziness, syncope, light-headedness, numbness and headaches.  Hematological: Negative for adenopathy.  Psychiatric/Behavioral: Negative for agitation, confusion, sleep disturbance and suicidal ideas. The patient is not nervous/anxious.      Objective: BP 118/88   Ht _0  (1.702 m)   Wt 185 lb (83.9 kg)   LMP 11/07/2018 (Exact Date)   BMI 28.98 kg/m    Physical Exam Constitutional:      Appearance: She is well-developed.  Genitourinary:     Vulva, vagina, uterus, right adnexa and left adnexa normal.     No vulval lesion or tenderness noted.     No vaginal discharge, erythema or tenderness.     No cervical motion tenderness or polyp.     Uterus is not enlarged or tender.     No right or left adnexal mass present.     Right adnexa not tender.     Left adnexa not tender.  Neck:     Musculoskeletal: Normal range of motion.     Thyroid: No thyromegaly.   Cardiovascular:     Rate and Rhythm: Normal rate and regular rhythm.     Heart sounds: Normal heart sounds. No murmur.  Pulmonary:     Effort: Pulmonary effort is normal.     Breath sounds: Normal breath sounds.  Abdominal:     Palpations: Abdomen is soft.     Tenderness: There is no abdominal tenderness. There is no guarding.  Musculoskeletal: Normal range of motion.  Neurological:     General: No focal deficit present.     Mental Status: She is alert and oriented to person, place, and time.     Cranial Nerves: No cranial nerve deficit.  Skin:    General: Skin is warm and dry.  Psychiatric:        Mood and Affect: Mood normal.        Behavior: Behavior normal.        Thought Content: Thought content normal.        Judgment: Judgment normal.  Vitals signs reviewed.     Assessment/Plan: Encounter for annual routine gynecological examination -   Cervical cancer screening - Plan: Cytology - PAP,   Screening for breast cancer - Plan: Pt current on exams/screening. Having RT prophylactic mastectomy so will no longer needs mammos.  Malignant neoplasm of upper-outer quadrant of female breast, unspecified estrogen receptor status, unspecified laterality ([place]) - Plan: Being tx with chemo and tamoxifen.  Monoallelic mutation of CHEK2 gene in female patient   Encounter for other general counseling or advice on contraception - Plan: Paragard IUD discussed since on tamoxifen and has triple pos breast cancer. Pt to consider and f/u if desires. Would insert with menses. Condoms in meantime.   Use of tamoxifen: F/u prn DUB.           GYN counsel breast self exam, mammography screening, adequate intake of calcium and vitamin D, diet and exercise     F/U  Return in about 1 year (around 12/04/2019).  Alicia B. Copland, PA-C 12/04/2018 3:55 PM

## 2018-12-04 NOTE — Patient Instructions (Signed)
I value your feedback and entrusting us with your care. If you get a [facility] patient survey, I would appreciate you taking the time to let us know about your experience today. Thank you! 

## 2018-12-05 ENCOUNTER — Other Ambulatory Visit: Payer: Self-pay

## 2018-12-05 ENCOUNTER — Other Ambulatory Visit
Admission: RE | Admit: 2018-12-05 | Discharge: 2018-12-05 | Disposition: A | Discharge status: Home / Self Care | Payer: BC Managed Care – PPO | Source: Ambulatory Visit | Attending: General Surgery | Admitting: General Surgery

## 2018-12-05 ENCOUNTER — Ambulatory Visit: Payer: BC Managed Care – PPO | Admitting: Physical Therapy

## 2018-12-05 ENCOUNTER — Other Ambulatory Visit: Payer: Self-pay | Admitting: *Deleted

## 2018-12-05 DIAGNOSIS — Z1159 Encounter for screening for other viral diseases: Secondary | ICD-10-CM | POA: Insufficient documentation

## 2018-12-05 DIAGNOSIS — Z01812 Encounter for preprocedural laboratory examination: Secondary | ICD-10-CM

## 2018-12-06 LAB — CYTOLOGY - PAP: Diagnosis: NEGATIVE

## 2018-12-06 LAB — NOVEL CORONAVIRUS, NAA (HOSP ORDER, SEND-OUT TO REF LAB; TAT 18-24 HRS): SARS-CoV-2, NAA: NOT DETECTED

## 2018-12-08 MED ORDER — CEFAZOLIN SODIUM-DEXTROSE 2-4 GM/100ML-% IV SOLN
2.0000 g | INTRAVENOUS | Status: AC
  Administered 2018-12-09: 2 g via INTRAVENOUS

## 2018-12-09 ENCOUNTER — Observation Stay
Admission: RE | Admit: 2018-12-09 | Discharge: 2018-12-10 | Disposition: A | Discharge status: Home / Self Care | Payer: BC Managed Care – PPO | Attending: General Surgery | Admitting: General Surgery

## 2018-12-09 ENCOUNTER — Other Ambulatory Visit: Payer: Self-pay

## 2018-12-09 ENCOUNTER — Encounter: Payer: Self-pay | Admitting: Emergency Medicine

## 2018-12-09 ENCOUNTER — Ambulatory Visit: Payer: BC Managed Care – PPO | Admitting: Certified Registered Nurse Anesthetist

## 2018-12-09 ENCOUNTER — Encounter
Admission: RE | Disposition: A | Discharge status: Home / Self Care | Payer: Self-pay | Source: Home / Self Care | Attending: General Surgery

## 2018-12-09 DIAGNOSIS — Z421 Encounter for breast reconstruction following mastectomy: Secondary | ICD-10-CM | POA: Diagnosis not present

## 2018-12-09 DIAGNOSIS — Z170 Estrogen receptor positive status [ER+]: Secondary | ICD-10-CM

## 2018-12-09 DIAGNOSIS — C50212 Malignant neoplasm of upper-inner quadrant of left female breast: Secondary | ICD-10-CM | POA: Diagnosis not present

## 2018-12-09 DIAGNOSIS — Z923 Personal history of irradiation: Secondary | ICD-10-CM | POA: Insufficient documentation

## 2018-12-09 DIAGNOSIS — Z79810 Long term (current) use of selective estrogen receptor modulators (SERMs): Secondary | ICD-10-CM | POA: Diagnosis not present

## 2018-12-09 DIAGNOSIS — Z853 Personal history of malignant neoplasm of breast: Secondary | ICD-10-CM | POA: Insufficient documentation

## 2018-12-09 DIAGNOSIS — Z9221 Personal history of antineoplastic chemotherapy: Secondary | ICD-10-CM | POA: Insufficient documentation

## 2018-12-09 DIAGNOSIS — Z8041 Family history of malignant neoplasm of ovary: Secondary | ICD-10-CM | POA: Diagnosis not present

## 2018-12-09 DIAGNOSIS — Z9012 Acquired absence of left breast and nipple: Secondary | ICD-10-CM | POA: Insufficient documentation

## 2018-12-09 DIAGNOSIS — Z79899 Other long term (current) drug therapy: Secondary | ICD-10-CM | POA: Diagnosis not present

## 2018-12-09 DIAGNOSIS — Z4001 Encounter for prophylactic removal of breast: Principal | ICD-10-CM | POA: Insufficient documentation

## 2018-12-09 DIAGNOSIS — Z803 Family history of malignant neoplasm of breast: Secondary | ICD-10-CM | POA: Insufficient documentation

## 2018-12-09 DIAGNOSIS — C50912 Malignant neoplasm of unspecified site of left female breast: Secondary | ICD-10-CM | POA: Diagnosis present

## 2018-12-09 HISTORY — PX: SIMPLE MASTECTOMY WITH AXILLARY SENTINEL NODE BIOPSY: SHX6098

## 2018-12-09 HISTORY — PX: BREAST RECONSTRUCTION WITH PLACEMENT OF TISSUE EXPANDER AND FLEX HD (ACELLULAR HYDRATED DERMIS): SHX6295

## 2018-12-09 LAB — POCT PREGNANCY, URINE: Preg Test, Ur: NEGATIVE

## 2018-12-09 SURGERY — SIMPLE MASTECTOMY
Anesthesia: General | Laterality: Right

## 2018-12-09 MED ORDER — PROPOFOL 10 MG/ML IV BOLUS
INTRAVENOUS | Status: DC | PRN
  Administered 2018-12-09: 200 mg via INTRAVENOUS

## 2018-12-09 MED ORDER — LIDOCAINE HCL (CARDIAC) PF 100 MG/5ML IV SOSY
PREFILLED_SYRINGE | INTRAVENOUS | Status: DC | PRN
  Administered 2018-12-09: 100 mg via INTRAVENOUS

## 2018-12-09 MED ORDER — ACETAMINOPHEN 10 MG/ML IV SOLN
INTRAVENOUS | Status: AC
  Filled 2018-12-09: qty 100

## 2018-12-09 MED ORDER — DEXAMETHASONE SODIUM PHOSPHATE 10 MG/ML IJ SOLN
INTRAMUSCULAR | Status: AC
  Filled 2018-12-09: qty 1

## 2018-12-09 MED ORDER — MAGNESIUM OXIDE 400 (241.3 MG) MG PO TABS
400.0000 mg | ORAL_TABLET | Freq: Every day | ORAL | Status: DC
  Administered 2018-12-09: 400 mg via ORAL
  Filled 2018-12-09: qty 1

## 2018-12-09 MED ORDER — PROPOFOL 10 MG/ML IV BOLUS
INTRAVENOUS | Status: AC
  Filled 2018-12-09: qty 20

## 2018-12-09 MED ORDER — LACTATED RINGERS IV SOLN
INTRAVENOUS | Status: DC
  Administered 2018-12-09: 15:00:00 via INTRAVENOUS

## 2018-12-09 MED ORDER — CEFAZOLIN SODIUM-DEXTROSE 2-4 GM/100ML-% IV SOLN
INTRAVENOUS | Status: AC
  Filled 2018-12-09: qty 100

## 2018-12-09 MED ORDER — GABAPENTIN 300 MG PO CAPS
300.0000 mg | ORAL_CAPSULE | ORAL | Status: AC
  Administered 2018-12-09: 300 mg via ORAL

## 2018-12-09 MED ORDER — ONDANSETRON HCL 4 MG/2ML IJ SOLN
INTRAMUSCULAR | Status: AC
  Filled 2018-12-09: qty 2

## 2018-12-09 MED ORDER — MIDAZOLAM HCL 2 MG/2ML IJ SOLN
INTRAMUSCULAR | Status: AC
  Filled 2018-12-09: qty 2

## 2018-12-09 MED ORDER — CEPHALEXIN 500 MG PO CAPS
500.0000 mg | ORAL_CAPSULE | Freq: Four times a day (QID) | ORAL | Status: DC
  Administered 2018-12-09 – 2018-12-10 (×3): 500 mg via ORAL
  Filled 2018-12-09 (×4): qty 1

## 2018-12-09 MED ORDER — KETOROLAC TROMETHAMINE 30 MG/ML IJ SOLN
INTRAMUSCULAR | Status: DC | PRN
  Administered 2018-12-09: 30 mg via INTRAVENOUS

## 2018-12-09 MED ORDER — ONDANSETRON HCL 4 MG/2ML IJ SOLN
INTRAMUSCULAR | Status: DC | PRN
  Administered 2018-12-09: 4 mg via INTRAVENOUS

## 2018-12-09 MED ORDER — CEFAZOLIN SODIUM-DEXTROSE 2-4 GM/100ML-% IV SOLN: 2.0000 g | INTRAVENOUS | Status: DC

## 2018-12-09 MED ORDER — FENTANYL CITRATE (PF) 100 MCG/2ML IJ SOLN
25.0000 ug | INTRAMUSCULAR | Status: DC | PRN
  Administered 2018-12-09: 25 ug via INTRAVENOUS
  Administered 2018-12-09: 50 ug via INTRAVENOUS
  Administered 2018-12-09 (×3): 25 ug via INTRAVENOUS

## 2018-12-09 MED ORDER — GABAPENTIN 300 MG PO CAPS
ORAL_CAPSULE | ORAL | Status: AC
  Administered 2018-12-09: 08:00:00 300 mg via ORAL
  Filled 2018-12-09: qty 1

## 2018-12-09 MED ORDER — PANTOPRAZOLE SODIUM 40 MG PO TBEC: 40.0000 mg | DELAYED_RELEASE_TABLET | Freq: Every day | ORAL | Status: DC | PRN

## 2018-12-09 MED ORDER — LIDOCAINE HCL (PF) 2 % IJ SOLN
INTRAMUSCULAR | Status: AC
  Filled 2018-12-09: qty 10

## 2018-12-09 MED ORDER — FENTANYL CITRATE (PF) 100 MCG/2ML IJ SOLN
INTRAMUSCULAR | Status: AC
  Filled 2018-12-09: qty 2

## 2018-12-09 MED ORDER — PROMETHAZINE HCL 25 MG/ML IJ SOLN: 6.2500 mg | INTRAMUSCULAR | Status: DC | PRN

## 2018-12-09 MED ORDER — PROMETHAZINE HCL 25 MG/ML IJ SOLN: 12.5000 mg | INTRAMUSCULAR | Status: DC | PRN

## 2018-12-09 MED ORDER — MORPHINE SULFATE (PF) 2 MG/ML IV SOLN: 2.0000 mg | INTRAVENOUS | Status: DC | PRN

## 2018-12-09 MED ORDER — FENTANYL CITRATE (PF) 100 MCG/2ML IJ SOLN
INTRAMUSCULAR | Status: AC
  Administered 2018-12-09: 15:00:00 25 ug via INTRAVENOUS
  Filled 2018-12-09: qty 2

## 2018-12-09 MED ORDER — DIAZEPAM 2 MG PO TABS: 2.0000 mg | ORAL_TABLET | Freq: Four times a day (QID) | ORAL | Status: DC | PRN

## 2018-12-09 MED ORDER — PHENYLEPHRINE HCL (PRESSORS) 10 MG/ML IV SOLN
INTRAVENOUS | Status: DC | PRN
  Administered 2018-12-09: 100 ug via INTRAVENOUS

## 2018-12-09 MED ORDER — FENTANYL CITRATE (PF) 100 MCG/2ML IJ SOLN
INTRAMUSCULAR | Status: DC | PRN
  Administered 2018-12-09: 25 ug via INTRAVENOUS
  Administered 2018-12-09 (×3): 50 ug via INTRAVENOUS
  Administered 2018-12-09: 25 ug via INTRAVENOUS

## 2018-12-09 MED ORDER — ACETAMINOPHEN 10 MG/ML IV SOLN
INTRAVENOUS | Status: DC | PRN
  Administered 2018-12-09: 1000 mg via INTRAVENOUS

## 2018-12-09 MED ORDER — DEXAMETHASONE SODIUM PHOSPHATE 10 MG/ML IJ SOLN
INTRAMUSCULAR | Status: DC | PRN
  Administered 2018-12-09: 10 mg via INTRAVENOUS

## 2018-12-09 MED ORDER — DEXMEDETOMIDINE HCL IN NACL 200 MCG/50ML IV SOLN
INTRAVENOUS | Status: DC | PRN
  Administered 2018-12-09: 12 ug via INTRAVENOUS

## 2018-12-09 MED ORDER — BUPIVACAINE-EPINEPHRINE (PF) 0.25% -1:200000 IJ SOLN
INTRAMUSCULAR | Status: AC
  Filled 2018-12-09: qty 30

## 2018-12-09 MED ORDER — ACETAMINOPHEN 325 MG PO TABS
650.0000 mg | ORAL_TABLET | ORAL | Status: DC
  Administered 2018-12-09 – 2018-12-10 (×2): 650 mg via ORAL
  Filled 2018-12-09 (×4): qty 2

## 2018-12-09 MED ORDER — OXYCODONE HCL 5 MG PO TABS
5.0000 mg | ORAL_TABLET | ORAL | Status: DC | PRN
  Administered 2018-12-09 – 2018-12-10 (×3): 5 mg via ORAL
  Filled 2018-12-09 (×3): qty 1

## 2018-12-09 MED ORDER — LACTATED RINGERS IV SOLN
INTRAVENOUS | Status: DC
  Administered 2018-12-09: 08:00:00 via INTRAVENOUS

## 2018-12-09 MED ORDER — METHYLENE BLUE 0.5 % INJ SOLN
INTRAVENOUS | Status: AC
  Filled 2018-12-09: qty 10

## 2018-12-09 MED ORDER — TAMOXIFEN CITRATE 10 MG PO TABS
20.0000 mg | ORAL_TABLET | Freq: Every day | ORAL | Status: DC
  Administered 2018-12-10: 10:00:00 20 mg via ORAL
  Filled 2018-12-09: qty 2

## 2018-12-09 MED ORDER — MIDAZOLAM HCL 2 MG/2ML IJ SOLN
INTRAMUSCULAR | Status: DC | PRN
  Administered 2018-12-09: 2 mg via INTRAVENOUS

## 2018-12-09 MED ORDER — CHLORHEXIDINE GLUCONATE 4 % EX LIQD: 1.0000 "application " | Freq: Once | CUTANEOUS | Status: DC

## 2018-12-09 MED ORDER — METHYLENE BLUE 0.5 % INJ SOLN
INTRAVENOUS | Status: DC | PRN
  Administered 2018-12-09: 5 mL

## 2018-12-09 MED ORDER — OMEPRAZOLE MAGNESIUM 20 MG PO TBEC: 20.0000 mg | DELAYED_RELEASE_TABLET | Freq: Every day | ORAL | Status: DC | PRN

## 2018-12-09 MED ORDER — ONDANSETRON HCL 4 MG/2ML IJ SOLN: 4.0000 mg | INTRAMUSCULAR | Status: DC | PRN

## 2018-12-09 MED ORDER — ENOXAPARIN SODIUM 40 MG/0.4ML ~~LOC~~ SOLN: 40.0000 mg | SUBCUTANEOUS | Status: DC

## 2018-12-09 SURGICAL SUPPLY — 98 items
APPLIER CLIP 11 MED OPEN (CLIP)
APPLIER CLIP 13 LRG OPEN (CLIP)
BAG DECANTER FOR FLEXI CONT (MISCELLANEOUS) ×1 IMPLANT
BINDER BREAST LRG (GAUZE/BANDAGES/DRESSINGS) IMPLANT
BINDER BREAST MEDIUM (GAUZE/BANDAGES/DRESSINGS) IMPLANT
BINDER BREAST XLRG (GAUZE/BANDAGES/DRESSINGS) IMPLANT
BINDER BREAST XXLRG (GAUZE/BANDAGES/DRESSINGS) IMPLANT
BIOPATCH WHT 1IN DISK W/4.0 H (GAUZE/BANDAGES/DRESSINGS) ×2 IMPLANT
BLADE BOVIE TIP EXT 4 (BLADE) ×3 IMPLANT
BLADE PHOTON ILLUMINATED (MISCELLANEOUS) IMPLANT
BLADE SURG 15 STRL LF DISP TIS (BLADE) ×1 IMPLANT
BLADE SURG 15 STRL SS (BLADE) ×2
BLADE SURG 15 STRL SS SAFETY (BLADE) ×3 IMPLANT
BNDG GAUZE 4.5X4.1 6PLY STRL (MISCELLANEOUS) ×2 IMPLANT
BULB RESERV EVAC DRAIN JP 100C (MISCELLANEOUS) ×7 IMPLANT
CANISTER SUCT 1200ML W/VALVE (MISCELLANEOUS) ×6 IMPLANT
CHLORAPREP W/TINT 26 (MISCELLANEOUS) ×7 IMPLANT
CLIP APPLIE 11 MED OPEN (CLIP) IMPLANT
CLIP APPLIE 13 LRG OPEN (CLIP) IMPLANT
CLOSURE WOUND 1/2 X4 (GAUZE/BANDAGES/DRESSINGS)
CNTNR SPEC 2.5X3XGRAD LEK (MISCELLANEOUS) ×2
CONT SPEC 4OZ STER OR WHT (MISCELLANEOUS) ×4
CONTAINER SPEC 2.5X3XGRAD LEK (MISCELLANEOUS) ×3 IMPLANT
COVER PROBE FLX POLY STRL (MISCELLANEOUS) ×1 IMPLANT
COVER WAND RF STERILE (DRAPES) ×1 IMPLANT
DECANTER SPIKE VIAL GLASS SM (MISCELLANEOUS) IMPLANT
DERMABOND ADVANCED (GAUZE/BANDAGES/DRESSINGS) ×2
DERMABOND ADVANCED .7 DNX12 (GAUZE/BANDAGES/DRESSINGS) ×3 IMPLANT
DEVICE DUBIN SPECIMEN MAMMOGRA (MISCELLANEOUS) IMPLANT
DRAIN CHANNEL 19F RND (DRAIN) ×4 IMPLANT
DRAIN CHANNEL JP 15F RND 16 (MISCELLANEOUS) ×1 IMPLANT
DRAPE LAPAROTOMY TRNSV 106X77 (MISCELLANEOUS) ×3 IMPLANT
DRSG GAUZE FLUFF 36X18 (GAUZE/BANDAGES/DRESSINGS) ×3 IMPLANT
DRSG TELFA 3X8 NADH (GAUZE/BANDAGES/DRESSINGS) ×3 IMPLANT
ELECT CAUTERY BLADE 6.4 (BLADE) ×3 IMPLANT
ELECT CAUTERY BLADE TIP 2.5 (TIP) ×3
ELECT REM PT RETURN 9FT ADLT (ELECTROSURGICAL) ×3
ELECTRODE CAUTERY BLDE TIP 2.5 (TIP) ×2 IMPLANT
ELECTRODE REM PT RTRN 9FT ADLT (ELECTROSURGICAL) ×1 IMPLANT
GAUZE SPONGE 4X4 12PLY STRL (GAUZE/BANDAGES/DRESSINGS) ×2 IMPLANT
GLOVE BIO SURGEON STRL SZ 6.5 (GLOVE) ×8 IMPLANT
GLOVE BIO SURGEON STRL SZ7.5 (GLOVE) ×5 IMPLANT
GLOVE BIO SURGEONS STRL SZ 6.5 (GLOVE) ×4
GLOVE INDICATOR 8.0 STRL GRN (GLOVE) ×5 IMPLANT
GOWN STRL REUS W/ TWL LRG LVL3 (GOWN DISPOSABLE) ×6 IMPLANT
GOWN STRL REUS W/TWL LRG LVL3 (GOWN DISPOSABLE) ×6
GRAFT FLEX HD 4X16 THICK (Tissue Mesh) ×4 IMPLANT
IMPL EXPANDER BREAST 375CC (Breast) IMPLANT
IMPLANT BREAST 375CC (Breast) ×2 IMPLANT
IMPLANT EXPANDER BREAST 375CC (Breast) ×1 IMPLANT
IV NS 1000ML (IV SOLUTION) ×2
IV NS 1000ML BAXH (IV SOLUTION) ×1 IMPLANT
IV NS 500ML (IV SOLUTION) ×2
IV NS 500ML BAXH (IV SOLUTION) ×1 IMPLANT
KIT TURNOVER KIT A (KITS) ×3 IMPLANT
LABEL OR SOLS (LABEL) ×3 IMPLANT
NDL 21 GA WING INFUSION (NEEDLE) ×1 IMPLANT
NDL HYPO 25X1 1.5 SAFETY (NEEDLE) IMPLANT
NEEDLE 21 GA WING INFUSION (NEEDLE) ×3 IMPLANT
NEEDLE HYPO 25X1 1.5 SAFETY (NEEDLE) IMPLANT
PACK BASIN MAJOR ARMC (MISCELLANEOUS) ×1 IMPLANT
PACK BASIN MINOR ARMC (MISCELLANEOUS) ×3 IMPLANT
PACK UNIVERSAL (MISCELLANEOUS) ×3 IMPLANT
PAD ABD DERMACEA PRESS 5X9 (GAUZE/BANDAGES/DRESSINGS) ×4 IMPLANT
PAD DRESSING TELFA 3X8 NADH (GAUZE/BANDAGES/DRESSINGS) ×1 IMPLANT
PIN SAFETY STRL (MISCELLANEOUS) ×6 IMPLANT
RETRACTOR RING XSMALL (MISCELLANEOUS) IMPLANT
RTRCTR WOUND ALEXIS 13CM XS SH (MISCELLANEOUS)
SET ASEPTIC TRANSFER (MISCELLANEOUS) ×3 IMPLANT
SHEARS FOC LG CVD HARMONIC 17C (MISCELLANEOUS) IMPLANT
SLEVE PROBE SENORX GAMMA FIND (MISCELLANEOUS) ×1 IMPLANT
SPONGE LAP 18X18 RF (DISPOSABLE) ×10 IMPLANT
STRIP CLOSURE SKIN 1/2X4 (GAUZE/BANDAGES/DRESSINGS) ×2 IMPLANT
SUT ETHILON 3-0 FS-10 30 BLK (SUTURE) ×3
SUT MNCRL 3-0 UNDYED SH (SUTURE) ×2 IMPLANT
SUT MNCRL 4-0 (SUTURE) ×4
SUT MNCRL 4-0 27XMFL (SUTURE) ×2
SUT MNCRL+ 5-0 UNDYED PC-3 (SUTURE) ×3 IMPLANT
SUT MONOCRYL 3-0 UNDYED (SUTURE) ×4
SUT MONOCRYL 5-0 (SUTURE) ×6
SUT PDS AB 1 CT1 27 (SUTURE) ×3 IMPLANT
SUT PDS PLUS 2 (SUTURE)
SUT PDS PLUS AB 2-0 CT-1 (SUTURE) ×6 IMPLANT
SUT SILK 2 0 (SUTURE) ×2
SUT SILK 2-0 30XBRD TIE 12 (SUTURE) ×1 IMPLANT
SUT SILK 4 0 SH (SUTURE) ×4 IMPLANT
SUT VIC AB 2-0 CT1 27 (SUTURE)
SUT VIC AB 2-0 CT1 TAPERPNT 27 (SUTURE) ×3 IMPLANT
SUT VIC AB 3-0 SH 27 (SUTURE) ×2
SUT VIC AB 3-0 SH 27X BRD (SUTURE) ×2 IMPLANT
SUT VICRYL+ 3-0 144IN (SUTURE) ×3 IMPLANT
SUTURE EHLN 3-0 FS-10 30 BLK (SUTURE) ×1 IMPLANT
SUTURE MNCRL 4-0 27XMF (SUTURE) ×2 IMPLANT
SWABSTK COMLB BENZOIN TINCTURE (MISCELLANEOUS) ×1 IMPLANT
SYR 10ML LL (SYRINGE) IMPLANT
SYR BULB IRRIG 60ML STRL (SYRINGE) ×3 IMPLANT
TAPE TRANSPORE STRL 2 31045 (GAUZE/BANDAGES/DRESSINGS) ×3 IMPLANT
TOWEL OR 17X26 4PK STRL BLUE (TOWEL DISPOSABLE) ×1 IMPLANT

## 2018-12-09 NOTE — H&P (Signed)
No change in clinical history or exam. For right prophylactic mastectomy and reconstruction.

## 2018-12-09 NOTE — Discharge Instructions (Signed)
INSTRUCTIONS FOR AFTER BREAST SURGERY   You are getting ready to undergo breast surgery.  You will likely have some questions about what to expect following your operation.  The following information will help you and your family understand what to expect when you are discharged from the hospital.  Following these guidelines will help ensure a smooth recovery and reduce risks of complications.   Postoperative instructions include information on: diet, wound care, medications and physical activity.  AFTER SURGERY You may need to spend one night in the hospital for observation.  DIET Breast surgery does not require a specific diet.  However, I have to mention that the healthier you eat the better your body can start healing. It is important to increasing your protein intake.  This means limiting the foods with sugar and carbohydrates.  Focus on vegetables and some meat.  If you have any liposuction during your procedure be sure to drink water.  If your urine is bright yellow, then it is concentrated, and you need to drink more water.  As a general rule after surgery, you should have 8 ounces of water every hour while awake.  If you find you are persistently nauseated or unable to take in liquids let us know.  NO TOBACCO USE or EXPOSURE.  This will slow your healing process and increase the risk of a wound.  WOUND CARE You can shower the day after the drain is removed.  Use fragrance free soap.  Dial, [place] and [place] are usually mild on the skin. While you have a drain, clean with baby wipes until the drain is removed.  If you have steri-strips / tape directly attached to your skin leave them in place. It is OK to get these wet.  No baths, pools or hot tubs for two weeks. We close your incision to leave the smallest and best-looking scar. No ointment or creams on your incisions until given the go ahead.  Especially not Neosporin (Too many skin reactions with this one).  A few weeks after surgery you can  use Mederma and start massaging the scar. We ask you to wear your binder or sports bra for the first 6 weeks around the clock, including while sleeping. This provides added comfort and helps reduce the fluid accumulation at the surgery site.  ACTIVITY No heavy lifting until cleared by the doctor.  This usually means no more than a half-gallon of milk.  It is OK to walk and climb stairs. In fact, moving your legs is very important to decrease your risk of a blood clot.  It will also help keep you from getting deconditioned.  Every 1 to 2 hours get up and walk for 5 minutes. This will help with a quicker recovery back to normal.  Let pain be your guide so you don't do too much.  NO, you cannot do the spring cleaning and don't plan on taking care of anyone else.  This is your time for TLC.  You will be more comfortable if you sleep and rest with your head elevated either with a few pillows under you or in a recliner.  No stomach sleeping for a few months.  WORK Everyone returns to work at different times. As a rough guide, most people take at least 1 - 2 weeks off prior to returning to work. If you need documentation for your job, bring the forms to your postoperative follow up visit.  DRIVING Arrange for someone to bring you home from the hospital.  You may be able to drive a few days after surgery but not while taking any narcotics or valium.  BOWEL MOVEMENTS Constipation can occur after anesthesia and while taking pain medication.  It is important to stay ahead for your comfort.  We recommend taking Milk of Magnesia (2 tablespoons; twice a day) while taking the pain pills.  SEROMA This is fluid your body tried to put in the surgical site.  This is normal but if it creates tight skinny skin let us know.  It usually decreases in a few weeks.  WHEN TO CALL Call your surgeon's office if any of the following occur:  Fever 101 degrees F or greater  Excessive bleeding or fluid from the incision  site.  Pain that increases over time without aid from the medications  Redness, warmth, or pus draining from incision sites  Persistent nausea or inability to take in liquids  Severe misshapen area that underwent the operation.  Here are some resources:  1. Plastic surgery website: https://www.plasticsurgery.org/for-medical-professionals/education-and-resources/publications/breast-reconstruction-magazine 2. Breast Reconstruction Awareness Campaign:  HotelLives[employer]co[employer]nz 3. Plastic surgery Implant information:  https://www.plasticsurgery.org/patient-safety/breast-implant-safety

## 2018-12-09 NOTE — Anesthesia Post-op Follow-up Note (Signed)
Anesthesia QCDR form completed.        

## 2018-12-09 NOTE — Op Note (Signed)
Preoperative diagnosis: Left breast cancer, desire for prophylactic right mastectomy.  Postoperative diagnosis: Same.  Operative procedure: Right simple mastectomy with sentinel node biopsy, immediate breast reconstruction.  Operating Surgeon: Hervey Ard, MD.  Assistant: Audelia Hives, DO.  Anesthesia: General by LMA.  Estimated blood loss: 50 cc.  Clinical note: This 43 year old woman is underwent treatment for left breast cancer.  She desired contralateral mastectomy.  She received Ancef prior to the procedure.  SCD stockings for DVT prevention.  Operative note: After the induction of general anesthesia the areola was cleansed with alcohol and 5 cc of 0.5% methylene blue was instilled.  The breast chest and axilla was then prepped with ChloraPrep and draped.  A small elliptical incision was outlined including the nipple areolar complex.  The skin was incised sharply and remaining dissection was completed with electrocautery.  Flaps were elevated to the sternum medially, inframammary fold inferiorly, serratus muscle laterally and to the base of the previously placed PowerPort superiorly, about 2 fingerbreadths below the clavicle.  The area of the port capsule was entered and closed later closed with a 3-0 Vicryl figure-of-eight suture.  The catheter was not disturbed.  The breast was elevated off the underlying pectoralis muscle taking the fascia of that muscle with the specimen.  The specimen was orientated and sent fresh to pathology per routine.  A single thickened slightly blue node was identified in the axilla and this was sent in formalin for routine histology.  The wound was irrigated and good hemostasis noted.  Plans for subpectoral expander were then undertaken by Dr.Dillingham and will be dictated separately.

## 2018-12-09 NOTE — Anesthesia Preprocedure Evaluation (Signed)
Anesthesia Evaluation  Patient identified by MRN, date of birth, ID band Patient awake    Reviewed: Allergy & Precautions, H&P , NPO status , Patient's Chart, lab work & pertinent test results  History of Anesthesia Complications Negative for: history of anesthetic complications  Airway Mallampati: II  TM Distance: >3 FB Neck ROM: full    Dental no notable dental hx. (+) Teeth Intact   Pulmonary neg pulmonary ROS, neg shortness of breath,    Pulmonary exam normal breath sounds clear to auscultation       Cardiovascular Exercise Tolerance: Good (-) Past MI negative cardio ROS Normal cardiovascular exam Rhythm:regular Rate:Normal     Neuro/Psych  Headaches, negative psych ROS   GI/Hepatic negative GI ROS, Neg liver ROS, neg GERD  ,  Endo/Other  negative endocrine ROS  Renal/GU negative Renal ROS  negative genitourinary   Musculoskeletal   Abdominal   Peds  Hematology negative hematology ROS (+)   Anesthesia Other Findings History of breast cancer.  Past Medical History: No date: Abnormal breast biopsy     Comment:  PASH, Dr. 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     Comment:  increased risk of breast and colon cancer   Reproductive/Obstetrics negative OB ROS                             Anesthesia Physical  Anesthesia Plan  ASA: II  Anesthesia Plan: General   Post-op Pain Management:    Induction: Intravenous  PONV Risk Score and Plan: Ondansetron, Dexamethasone, Midazolam, Promethazine and  Treatment may vary due to age or medical condition  Airway Management Planned: LMA  Additional Equipment:   Intra-op Plan:   Post-operative Plan: Extubation in OR  Informed Consent: I have reviewed the patients History and Physical, chart, labs and discussed the procedure including the risks, benefits and alternatives for the proposed anesthesia with the patient or authorized representative who has indicated his/her understanding and acceptance.     Dental Advisory Given  Plan Discussed with: Anesthesiologist, CRNA and Surgeon  Anesthesia Plan Comments:         Anesthesia Quick Evaluation  

## 2018-12-09 NOTE — Transfer of Care (Signed)
Immediate Anesthesia Transfer of Care Note  Patient: Denise [name]  Procedure(s) Performed: SIMPLE MASTECTOMY RIGHT (Right ) BREAST RECONSTRUCTION WITH PLACEMENT OF TISSUE EXPANDER AND FLEX HD (ACELLULAR HYDRATED DERMIS) (Right )  Patient Location: PACU  Anesthesia Type:General  Level of Consciousness: awake, alert  and oriented  Airway & Oxygen Therapy: Patient Spontanous Breathing and Patient connected to face mask oxygen  Post-op Assessment: Report given to RN and Post -op Vital signs reviewed and stable  Post vital signs: Reviewed and stable  Last Vitals:  Vitals Value Taken Time  BP 151/74 12/09/18 1147  Temp 36.2 C 12/09/18 1147  Pulse 96 12/09/18 1149  Resp 16 12/09/18 1149  SpO2 97 % 12/09/18 1149  Vitals shown include unvalidated device data.  Last Pain:  Vitals:   12/09/18 0820  TempSrc: Tympanic  PainSc: 0-No pain         Complications: No apparent anesthesia complications

## 2018-12-09 NOTE — Progress Notes (Signed)
Per Dr Bary Castilla ok to put IV in right arm

## 2018-12-09 NOTE — Anesthesia Procedure Notes (Signed)
Procedure Name: LMA Insertion Date/Time: 12/09/2018 9:44 AM Performed by: Johnna Acosta, CRNA Pre-anesthesia Checklist: Patient identified, Emergency Drugs available, Suction available, Patient being monitored and Timeout performed Patient Re-evaluated:Patient Re-evaluated prior to induction Oxygen Delivery Method: Circle system utilized Preoxygenation: Pre-oxygenation with 100% oxygen Induction Type: IV induction LMA: LMA inserted LMA Size: 4.0 Tube type: Oral Number of attempts: 1 Tube secured with: Tape Dental Injury: Teeth and Oropharynx as per pre-operative assessment

## 2018-12-09 NOTE — Op Note (Signed)
Op report    DATE OF OPERATION:  12/09/2018  LOCATION: [facility]  SURGICAL DIVISION: Plastic Surgery  PREOPERATIVE DIAGNOSES:  1. History of Breast cancer.    POSTOPERATIVE DIAGNOSES:  1. History of Breast cancer.   PROCEDURE:  1. Right immediate breast reconstruction with placement of Acellular Dermal Matrix and tissue expanders.  SURGEON: Lassie Demorest Sanger Joice Nazario, DO  ANESTHESIA:  General.   COMPLICATIONS: None.   IMPLANTS: Right - Mentor 375 cc. Ref #[id number].  Serial Number [id number], 150 cc of injectable saline placed in the expander. Acellular Dermal Matrix 4 x 16 cm  INDICATIONS FOR PROCEDURE:  The patient, Denise [name], is a 43 y.o. female born on 11-09-1975, is here for  immediate first stage breast reconstruction with placement of a right tissue expander and Acellular dermal matrix. MRN: [id number]  CONSENT:  Informed consent was obtained directly from the patient. Risks, benefits and alternatives were fully discussed. Specific risks including but not limited to bleeding, infection, hematoma, seroma, scarring, pain, implant infection, implant extrusion, capsular contracture, asymmetry, wound healing problems, and need for further surgery were all discussed. The patient did have an ample opportunity to have her questions answered to her satisfaction.   DESCRIPTION OF PROCEDURE:  The patient was taken to the operating room by the general surgery team. SCDs were placed and IV antibiotics were given. The patient's chest was prepped and draped in a sterile fashion. A time out was performed and the implants to be used were identified.  A right mastectomy was performed.  Once the general surgery team had completed their portion of the case the patient was rendered to the plastic and reconstructive surgery team.  Right:  The pectoralis major muscle was lifted from the chest wall with release of the lateral edge and lateral inframammary fold.  The pocket was  irrigated with antibiotic solution and hemostasis was achieved with electrocautery.  The ADM was then prepared according to the manufacture guidelines and slits placed to help with postoperative fluid management.  The ADM was then sutured to the inferior and lateral edge of the inframammary fold with 2-0 PDS starting with an interrupted stitch and then a running stitch.  The lateral portion was sutured to with interrupted sutures after the expander was placed.  The expander was prepared according to the manufacture guidelines, the air evacuated and then it was placed under the ADM and pectoralis major muscle.  The inferior and lateral tabs were used to secure the expander to the chest wall with 2-0 PDS.  The drain was placed at the inframammary fold over the ADM and secured to the skin with 3-0 Silk.    The deep layers were closed with 3-0 Monocryl followed by 4-0 Monocryl.  The skin was closed with 5-0 Monocryl and then dermabond was applied.  The ABDs and breast binder were placed.  The patient tolerated the procedure well and there were no complications.  The patient was allowed to wake from anesthesia and taken to the recovery room in satisfactory condition.

## 2018-12-09 NOTE — Interval H&P Note (Signed)
History and Physical Interval Note:  12/09/2018 8:59 AM  Denise [name]  has presented today for surgery, with the diagnosis of PROPHYLACTIC MASTECTOMY.  The various methods of treatment have been discussed with the patient and family. After consideration of risks, benefits and other options for treatment, the patient has consented to  Procedure(s): SIMPLE MASTECTOMY RIGHT (Right) BREAST RECONSTRUCTION WITH PLACEMENT OF TISSUE EXPANDER AND FLEX HD (ACELLULAR HYDRATED DERMIS) (Right) as a surgical intervention.  The patient's history has been reviewed, patient examined, no change in status, stable for surgery.  I have reviewed the patient's chart and labs.  Questions were answered to the patient's satisfaction.     [name] Oluwatomisin Deman

## 2018-12-10 ENCOUNTER — Encounter: Payer: BC Managed Care – PPO | Admitting: Physical Therapy

## 2018-12-10 DIAGNOSIS — Z4001 Encounter for prophylactic removal of breast: Secondary | ICD-10-CM | POA: Diagnosis not present

## 2018-12-10 MED ORDER — CEPHALEXIN 500 MG PO CAPS: 500.0000 mg | ORAL_CAPSULE | Freq: Four times a day (QID) | ORAL | 0 refills | Status: DC

## 2018-12-10 NOTE — Final Progress Note (Signed)
AVSS. Comfortable. Flaps: Healthy  Drainage: 120 since surgery.  Home today.

## 2018-12-10 NOTE — Anesthesia Postprocedure Evaluation (Signed)
Anesthesia Post Note  Patient: Denise [name]  Procedure(s) Performed: SIMPLE MASTECTOMY RIGHT (Right ) BREAST RECONSTRUCTION WITH PLACEMENT OF TISSUE EXPANDER AND FLEX HD (ACELLULAR HYDRATED DERMIS) (Right )  Patient location during evaluation: PACU Anesthesia Type: General Level of consciousness: awake and alert Pain management: pain level controlled Vital Signs Assessment: post-procedure vital signs reviewed and stable Respiratory status: spontaneous breathing, nonlabored ventilation and respiratory function stable Cardiovascular status: blood pressure returned to baseline and stable Postop Assessment: no apparent nausea or vomiting Anesthetic complications: no     Last Vitals:  Vitals:   12/09/18 2017 12/10/18 0442  BP: (!) 144/64 107/60  Pulse: 100 79  Resp: 20 20  Temp: 36.9 C 36.6 C  SpO2: 96% 99%    Last Pain:  Vitals:   12/10/18 0442  TempSrc: Oral  PainSc:                  [name]

## 2018-12-12 ENCOUNTER — Encounter: Payer: BC Managed Care – PPO | Admitting: Physical Therapy

## 2018-12-12 ENCOUNTER — Other Ambulatory Visit: Payer: Self-pay | Admitting: General Surgery

## 2018-12-12 ENCOUNTER — Telehealth: Payer: Self-pay | Admitting: Plastic Surgery

## 2018-12-12 LAB — SURGICAL PATHOLOGY

## 2018-12-12 MED ORDER — HYDROCODONE-ACETAMINOPHEN 5-325 MG PO TABS: 1.0000 | ORAL_TABLET | ORAL | 0 refills | Status: DC | PRN

## 2018-12-12 NOTE — Progress Notes (Signed)
Patient notified of benign path report. Did not have hydrocodone from originally scheduled procedure in March, so new RX sent to pharmacy.

## 2018-12-12 NOTE — Telephone Encounter (Signed)
Received a voicemail from patient stating that she never picked up prescription for hydrocodone. Patient requesting new script so that she can now pick up the needed prescription. Patient requesting call back once filled

## 2018-12-13 NOTE — Discharge Summary (Signed)
Physician Discharge Summary  Patient ID: Denise [name] MRN: [id number] DOB/AGE: December 16, 1975 43 y.o.  Admit date: 12/09/2018 Discharge date: 12/13/2018  Admission Diagnoses: Desire for prophylactic mastectomy.  Discharge Diagnoses:  Active Problems:   Admission for breast reconstruction following mastectomy   Discharged Condition: good  Hospital Course: The patient underwent a right prophylactic mastectomy with immediate placement of a subpectoral expander.  She tolerated the procedure well.  Postoperative pain control was good.  She was able to be discharged the morning after surgery.  Consults: None  Significant Diagnostic Studies: None.  Treatments: IV hydration  Discharge Exam: Blood pressure 107/60, pulse 79, temperature 97.9 F (36.6 C), temperature source Oral, resp. rate 20, height 5\' 7"  (1.702 m), weight 83.9 kg, SpO2 99 %. General appearance: alert and cooperative Breast flaps were healthy.    Pathology: Benign breast tissue.  Sentinel node negative.  Disposition:   Discharge Instructions    Diet - low sodium heart healthy   Complete by: As directed    Discharge instructions   Complete by: As directed    Keep wrap clean and dry.  Eat one carton of yogurt daily to minimize GI effects of antibiotic.   Keflex (antibiotic): 4 x/ day.   Tylenol/ Advil/ Aleve: If needed for soreness.  Norco (hydrocodone): If needed for pain. This medication may constipate.  Valium: If needed for muscle spasms.  Laxative of choice if needed.  No driving until pain free.  Empty and record drainage 2-3 x/ day. Bring record to all office appointments.   Increase activity slowly   Complete by: As directed      Allergies as of 12/10/2018      Reactions   Steri-strip Compound Benzoin [benzoin Compound]    Steri-strip ,    Tape Other (See Comments)   Minor skin irritation      Medication List    TAKE these medications   acetaminophen 500 MG tablet Commonly known as:  TYLENOL Take 500 mg by mouth every 6 (six) hours as needed for moderate pain or headache.   cephALEXin 500 MG capsule Commonly known as: KEFLEX Take 1 capsule (500 mg total) by mouth every 6 (six) hours.   diazepam 2 MG tablet Commonly known as: Valium Take 1 tablet (2 mg total) by mouth every 6 (six) hours as needed for anxiety.   HYDROcodone-acetaminophen 5-325 MG tablet Commonly known as: Norco Take 1 tablet by mouth every 8 (eight) hours as needed for moderate pain.   lidocaine-prilocaine cream Commonly known as: EMLA Apply 1 application topically as needed (port access).   magnesium oxide 400 MG tablet Commonly known as: MAG-OX Take 1 tablet (400 mg total) by mouth daily. What changed: when to take this   omeprazole 20 MG tablet Commonly known as: PRILOSEC OTC Take 20 mg by mouth daily as needed (for acid reflux).   tamoxifen 20 MG tablet Commonly known as: NOLVADEX Take 1 tablet (20 mg total) by mouth daily.      Follow-up Information    Dillingham, [name], DO In 1 week.   Specialty: Plastic Surgery Contact information: [address] [place] [address] [phone]        [name], MD In 2 weeks.   Specialties: General Surgery, Radiology Contact information: [address] [place] [address] [phone]           Signed: [name] 12/13/2018, 7:39 PM

## 2018-12-13 NOTE — Telephone Encounter (Signed)
Called and spoke with the patient on (12/12/18) regarding the message below.  She stated that she has called her general surgeon and he will be calling in a new prescription.//AB/CMA

## 2018-12-16 ENCOUNTER — Telehealth: Payer: Self-pay | Admitting: Plastic Surgery

## 2018-12-16 NOTE — Telephone Encounter (Signed)

## 2018-12-17 ENCOUNTER — Other Ambulatory Visit: Payer: Self-pay

## 2018-12-17 ENCOUNTER — Encounter: Payer: Self-pay | Admitting: Surgical

## 2018-12-17 ENCOUNTER — Encounter: Payer: BC Managed Care – PPO | Admitting: Physical Therapy

## 2018-12-17 ENCOUNTER — Ambulatory Visit (INDEPENDENT_AMBULATORY_CARE_PROVIDER_SITE_OTHER): Payer: BC Managed Care – PPO | Admitting: Surgical

## 2018-12-17 DIAGNOSIS — Z9011 Acquired absence of right breast and nipple: Secondary | ICD-10-CM

## 2018-12-17 NOTE — Progress Notes (Signed)
[facility]  [address] [place], [place] [address] Phone: [phone]  Fax: [phone]   Clinic Day:  12/19/2018  Referring physician: [name],*  Chief Complaint: Denise [name] is a 43 y.o. female with multi-focal Her2/neu + left breast cancer who is seen for 3 week assessment prior to cycle #8Kadcyla.  HPI: The patient was last seen in the medical oncology clinic on 11/21/2018. At that time, she was doing well.  Exam was unremarkable. Hemoglobin was 11.8, and hematocrit 35.5. She received cycle #7 Kadcyla.  She had a gynecologic exam on 49/44/9675 by Ardeth Perfect, PA-C.  She was noted to have 2 menstrual cycles 1 month apart since chemotherapy on 12/04/2018.  Pap smear was negative.  She continues on tamoxifen.   She underwent right prophylactic mastectomy and reconstruction on 12/09/2018.  Pathology on 12/10/2018 revealed benign breast tissue.  Sentinel lymph node was negative.  She still has a drain in place.  During the interim, the patient reports that she is doing well.  She is taking tamoxifen. She reports the Kadcyla gives her no trouble and she denies any new symptoms.    Past Medical History:  Diagnosis Date  . Abnormal breast biopsy    [facility], [name]  . Breast cancer ([address]) 11/2017   left breast; ER/PR/Her2neu POS  . Family history of breast cancer   . Family history of ovarian cancer   . Fibroadenoma of breast, left 2000, 2016  . Genetic screening 08/26/2013   BRCA/BART negative/Myriad, CHEK2 POS 2017  . Increased risk of breast cancer 2017   IBIS=47%  . Left breast lump 06/18/2017   Fluid/drained [name]  . Migraine   . Monoallelic mutation of CHEK2 gene in female patient 2017   increased risk of breast and colon cancer  . Personal history of chemotherapy   . Personal history of radiation therapy     Past Surgical History:  Procedure Laterality Date  . BREAST BIOPSY Bilateral 09/08/14   neg  .  BREAST BIOPSY Left 03/16/2015   Procedure: BREAST BIOPSY WITH NEEDLE LOCALIZATION;  Surgeon: [name], MD;  Location: [facility] ORS;  Service: General;  Laterality: Left;  . BREAST BIOPSY Left 11/2017   positive  . BREAST CYST ASPIRATION Left 06/18/2017   cyst aspiration  . BREAST EXCISIONAL BIOPSY Left 09/2014  . BREAST EXCISIONAL BIOPSY Right    age 11's  . BREAST RECONSTRUCTION WITH PLACEMENT OF TISSUE EXPANDER AND FLEX HD (ACELLULAR HYDRATED DERMIS) Left 05/20/2018   Procedure: BREAST RECONSTRUCTION WITH PLACEMENT OF TISSUE EXPANDER AND FLEX HD (ACELLULAR HYDRATED DERMIS);  Surgeon: [name], DO;  Location: [facility] ORS;  Service: Plastics;  Laterality: Left;  . BREAST RECONSTRUCTION WITH PLACEMENT OF TISSUE EXPANDER AND FLEX HD (ACELLULAR HYDRATED DERMIS) Right 12/09/2018   Procedure: BREAST RECONSTRUCTION WITH PLACEMENT OF TISSUE EXPANDER AND FLEX HD (ACELLULAR HYDRATED DERMIS);  Surgeon: [name], DO;  Location: [facility] ORS;  Service: Plastics;  Laterality: Right;  . BREAST SURGERY Right March 2000   benign fibroadenoma  . BREAST SURGERY Left 08/08/13   excision  . BREAST SURGERY Left 09/23/14   excision  . MASTECTOMY Left 05/20/2018  . MASTECTOMY W/ SENTINEL NODE BIOPSY Left 05/20/2018   Procedure: MASTECTOMY WITH SENTINEL LYMPH NODE BIOPSY;  Surgeon: [name], MD;  Location: [facility] ORS;  Service: General;  Laterality: Left;  . PORTACATH PLACEMENT Right 12/17/2017   Procedure: INSERTION PORT-A-CATH;  Surgeon: [name], MD;  Location:  [facility] ORS;  Service: General;  Laterality: Right;  . SIMPLE MASTECTOMY WITH AXILLARY SENTINEL NODE BIOPSY Right 12/09/2018   Procedure: SIMPLE MASTECTOMY RIGHT;  Surgeon: [name], MD;  Location: [facility] ORS;  Service: General;  Laterality: Right;    Family History  Problem Relation Age of Onset  . Prostate cancer Father 65       requiring tx/hormones  . Breast cancer Paternal Grandmother 1  . Breast cancer  Maternal Aunt 60  . Ovarian cancer Maternal Aunt 70  . Breast cancer Paternal Aunt 24  . Pancreatic cancer Maternal Aunt 75    Social History:  reports that she has never smoked. She has never used smokeless tobacco. She reports current alcohol use. She reports that she does not use drugs. She does not smoke. She "occasionally" drinks alcohol. 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 Patient denies known exposures to radiation on toxins. Her husband's name is [name]. She has 2 daughters, [name] and [name] (ages 37 1/2 and 55). Her husband's name is [name].  The patient is alone today.  Allergies:  Allergies  Allergen Reactions  . Steri-Strip Compound Benzoin [Benzoin Compound]     Steri-strip ,   . Tape Other (See Comments)    Minor skin irritation     Current Medications: Current Outpatient Medications  Medication Sig Dispense Refill  . acetaminophen (TYLENOL) 500 MG tablet Take 500 mg by mouth every 6 (six) hours as needed for moderate pain or headache.    . diazepam (VALIUM) 2 MG tablet Take 1 tablet (2 mg total) by mouth every 6 (six) hours as needed for anxiety. 30 tablet 0  . HYDROcodone-acetaminophen (NORCO) 5-325 MG tablet Take 1 tablet by mouth every 8 (eight) hours as needed for moderate pain. 20 tablet 0  . magnesium oxide (MAG-OX) 400 MG tablet Take 1 tablet (400 mg total) by mouth daily. (Patient taking differently: Take 400 mg by mouth 2 (two) times a week. ) 30 tablet 0  . omeprazole (PRILOSEC OTC) 20 MG tablet Take 20 mg by mouth daily as needed (for acid reflux).    . tamoxifen (NOLVADEX) 20 MG tablet Take 1 tablet (20 mg total) by mouth daily. 30 tablet 3  . cephALEXin (KEFLEX) 500 MG capsule Take 1 capsule (500 mg total) by mouth every 6 (six) hours. (Patient not taking: Reported on 12/19/2018) 30 capsule 0  . lidocaine-prilocaine (EMLA) cream Apply 1 application topically as needed (port access).     No current facility-administered medications  for this visit.    Facility-Administered Medications Ordered in Other Visits  Medication Dose Route Frequency Provider Last Rate Last Dose  . heparin lock flush 100 unit/mL  500 Units Intravenous Once ,  C, MD      . sodium chloride flush (NS) 0.9 % injection 10 mL  10 mL Intravenous PRN Lequita Asal, MD   10 mL at 04/17/18 0844  . sodium chloride flush (NS) 0.9 % injection 10 mL  10 mL Intravenous PRN Lequita Asal, MD   10 mL at 12/19/18 0950    Review of Systems  Constitutional: Positive for weight loss (1 pounds). Negative for chills (no night sweats or hot flashes), diaphoresis, fever and malaise/fatigue.       Doing well.  HENT: Negative.  Negative for congestion, ear pain, hearing loss, nosebleeds, sinus pain, sore throat and tinnitus.   Eyes: Negative.  Negative for blurred vision, double vision, photophobia, pain, discharge and redness.  No visual changes.  Respiratory: Negative.  Negative for cough, hemoptysis, sputum production and shortness of breath.   Cardiovascular: Negative.  Negative for chest pain, palpitations, orthopnea, leg swelling and PND.  Gastrointestinal: Negative.  Negative for abdominal pain, blood in stool, constipation, diarrhea, heartburn, melena, nausea and vomiting.  Genitourinary: Negative.  Negative for dysuria, frequency, hematuria and urgency.  Musculoskeletal: Negative for back pain, falls, joint pain, myalgias (2wks after treatment, resolved) and neck pain.  Skin: Negative.  Negative for itching and rash.  Neurological: Negative.  Negative for dizziness, tremors, sensory change, speech change, focal weakness, weakness and headaches.  Endo/Heme/Allergies: Negative.  Does not bruise/bleed easily.       Diabetes- controlled.  Psychiatric/Behavioral: Negative.  Negative for depression and memory loss. The patient is not nervous/anxious and does not have insomnia.   All other systems reviewed and are negative.  Performance  status (ECOG): 0  Vital Signs Blood pressure 121/81, pulse 76, temperature (!) 97 F (36.1 C), temperature source Tympanic, resp. rate 18, height 5' 7"  (1.702 m), weight 184 lb 4.9 oz (83.6 kg), last menstrual period 12/04/2018, SpO2 100 %.   Physical Exam  Constitutional: Vital signs are normal. She appears well-developed and well-nourished. No distress.  HENT:  Head: Normocephalic and atraumatic.  Mouth/Throat: Oropharynx is clear and moist.  Short dark hair. Wearing mask.  Eyes: Pupils are equal, round, and reactive to light. Conjunctivae and EOM are normal. No scleral icterus.  Neck: Normal range of motion. Neck supple. No JVD present.  Cardiovascular: Normal rate, regular rhythm and normal heart sounds. Exam reveals no gallop and no friction rub.  No murmur heard. Pulmonary/Chest: Effort normal and breath sounds normal. No respiratory distress. She has no wheezes. She has no rales. Breasts are asymmetrical (s/p right mastectomy with implant; drain in place).  Abdominal: Soft. Bowel sounds are normal. She exhibits no distension and no mass. There is no abdominal tenderness. There is no rebound and no guarding.  Musculoskeletal: Normal range of motion.        General: No tenderness or edema.  Lymphadenopathy:    She has no cervical adenopathy.    She has no axillary adenopathy.       Right: No supraclavicular adenopathy present.       Left: No supraclavicular adenopathy present.  Neurological: She is alert.  Skin: Skin is warm and dry. No rash noted. She is not diaphoretic. No erythema. No pallor.  Psychiatric: She has a normal mood and affect. Her behavior is normal. Judgment and thought content normal.  Nursing note and vitals reviewed.   Infusion on 12/19/2018  Component Date Value Ref Range Status  . Preg Test, Ur 12/19/2018 NEGATIVE  NEGATIVE Final   Performed at [facility], [address], [place], [place] [address]  . Magnesium 12/19/2018 1.8  1.7 - 2.4  mg/dL Final   Performed at [facility], [address], [place], [place] [address]  . Sodium 12/19/2018 135  135 - 145 mmol/L Final  . Potassium 12/19/2018 3.7  3.5 - 5.1 mmol/L Final  . Chloride 12/19/2018 103  98 - 111 mmol/L Final  . CO2 12/19/2018 24  22 - 32 mmol/L Final  . Glucose, Bld 12/19/2018 99  70 - 99 mg/dL Final  . BUN 12/19/2018 15  6 - 20 mg/dL Final  . Creatinine, Ser 12/19/2018 0.64  0.44 - 1.00 mg/dL Final  . Calcium 12/19/2018 9.0  8.9 - 10.3 mg/dL Final  . Total Protein  12/19/2018 7.3  6.5 - 8.1 g/dL Final  . Albumin 12/19/2018 3.8  3.5 - 5.0 g/dL Final  . AST 12/19/2018 23  15 - 41 U/L Final  . ALT 12/19/2018 22  0 - 44 U/L Final  . Alkaline Phosphatase 12/19/2018 64  38 - 126 U/L Final  . Total Bilirubin 12/19/2018 0.8  0.3 - 1.2 mg/dL Final  . GFR calc non Af Amer 12/19/2018 >60  >60 mL/min Final  . GFR calc Af Amer 12/19/2018 >60  >60 mL/min Final  . Anion gap 12/19/2018 8  5 - 15 Final   Performed at [facility], [address], [place], [place] [address]  . WBC 12/19/2018 5.6  4.0 - 10.5 K/uL Final  . RBC 12/19/2018 4.28  3.87 - 5.11 MIL/uL Final  . Hemoglobin 12/19/2018 12.2  12.0 - 15.0 g/dL Final  . HCT 12/19/2018 36.1  36.0 - 46.0 % Final  . MCV 12/19/2018 84.3  80.0 - 100.0 fL Final  . MCH 12/19/2018 28.5  26.0 - 34.0 pg Final  . MCHC 12/19/2018 33.8  30.0 - 36.0 g/dL Final  . RDW 12/19/2018 14.0  11.5 - 15.5 % Final  . Platelets 12/19/2018 214  150 - 400 K/uL Final  . nRBC 12/19/2018 0.0  0.0 - 0.2 % Final  . Neutrophils Relative % 12/19/2018 58  % Final  . Neutro Abs 12/19/2018 3.2  1.7 - 7.7 K/uL Final  . Lymphocytes Relative 12/19/2018 27  % Final  . Lymphs Abs 12/19/2018 1.5  0.7 - 4.0 K/uL Final  . Monocytes Relative 12/19/2018 8  % Final  . Monocytes Absolute 12/19/2018 0.5  0.1 - 1.0 K/uL Final  . Eosinophils Relative 12/19/2018 6  % Final  . Eosinophils Absolute 12/19/2018 0.3  0.0 - 0.5 K/uL Final  . Basophils  Relative 12/19/2018 1  % Final  . Basophils Absolute 12/19/2018 0.1  0.0 - 0.1 K/uL Final  . Immature Granulocytes 12/19/2018 0  % Final  . Abs Immature Granulocytes 12/19/2018 0.02  0.00 - 0.07 K/uL Final   Performed at [facility], [address], [place], [place] [address]    Assessment:  KYNZLEY DOWSON is a 43 y.o. female with multi-focal stage IB Her2/neu + left breast cancers/p neoadjuvant chemotherapy followed by mastectomywith sentinel lymph node biopsy on 05/20/2018. Pathologyrevealed no residual carcinoma in the breast. One of three sentinel lymph nodes were positive. One lymph node had 2 metastatic foci (2.1 mm and 1 mm). Pathologic stagerevealed ypT0 ypN1a.  Index breast mass and axillary nodebiopsyon 12/06/2017 revealed grade II invasive ductal carcinoma with calcifications at the 11 o'clock position. There was metastatic carcinoma in 1 of 1 lymph nodes. Tumor was ER + (100%), PR + (100%), and Ki67 5%. Her2/neu was 3+ by IHC (heterogeneous) and FISH +.  Diagnostic left mammogram and ultrasoundon 12/06/2017 revealed a 1.7 x 1.3 x 1.3 cm irregular hypoechoic mass with internal calcifications at the 11 o'clock position 2 cm from the nipple with internal calcifications. There was a similar appearing 0.9 x 0.8 x 0.7 cm mass at the 11 o'clock position 4 cm from the nipple (2.4 cm from the index lesion). There were suspicious, segmental calcifications originating from the index lesion and extending 5 cm posteriorly. There was a 0.6 cm morphologically abnormal left axillary lymph node.  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 The adjacent and more superiorly located 11 o'clock mass (7 x 12 mm) seen on recent ultrasound, 4 cm from the nipple on ultrasound, was also identified. There were 2 probable satellite lesions (7 mm, medial lesion;  posterior and lateral lesion). The total span of malignancywas 3 x 3.1 x 4.5 cm. There were calcificationsextending up to 5 cm posterior to the biopsied malignancy which were highly suspicious on mammography but not appreciated. There was a mass in the lower outer left breast measured 5 mm, representing a change. The known metastatic nodein the left axilla was identified. A node along the posterolateral margin of the pectoralis minor (cortex 5 mm) was nonspecific but at least somewhat suspicious.  Chest, abdomen, and pelvic CTon 12/19/2017 revealed a solitary enlarged biopsy-proven metastatic left axillary lymph node. There were no additional findings suspicious for metastatic disease in the chest, abdomen or pelvis. There was a nonspecific tiny sclerotic upper right sacral lesion, more likely a benign bone island. Bone scintigraphy correlation versus follow-up CT could be considered.  Bone scanon 01/11/2018 was negative for metastatic pattern. No areas of focal tracer uptake. Area of concern in sacrum on CT likely represents benign bone island.   Myriad genetic testingon 08/26/2013 was negative for BRCA1 and 2. CHEK2 mutationpositive (increased risk of breast and colon cancer). She has a family history significant for breast, ovarian, pancreatic, and prostate cancer.  She received 6 cycles of TCHP chemotherapy (12/21/2017 - 04/17/2018) with Margarette Canada support. Cycle #3 was held due to elevated LFTs on 02/04/2018. She received Herceptin + Perjeta alone (04/30/2018 - 05/30/2018). She is s/p 7cycles of Kadcyla(06/20/2018 - 07/11/2018; 08/20/2018- 11/21/2018). Cycle #3 was held on 08/01/2018 due to visual changes (blurred vision).  She received breast radiationfrom 06/26/2018 - 08/05/2018.  She underwent right prophylactic mastectomy with reconstruction on 12/09/2018.  Pathology on 12/10/2018 revealed benign breast tissue.  Sentinel lymph node was negative.   Echocardiograms: EF 60-65%  on 12/20/2017, 60-65% on 03/27/2018, 55-60% on 06/18/2018, and 55-60% on 09/27/2018.  She has a history of elevated LFTs. Hepatitis B and C serologies were negative on 02/04/2018.RUQ ultrasound on 02/06/2018 revealed a small anterior gallbladder wall polyp. There was no evidence of cholelithiasis or cholecystis. CBD measured normal at 2 mm, with no evidence of choledocholithiasis. There was normal direction of blood flow towards the liver noted.  She has vasomotor symptoms. She discontinued Effexor around 09/18/2018. She was amenorrheic from 11/2017 - 10/15/2018.  [place] and estradiol confirmed a premenopausal state on 10/01/2018.   Menses restarted on 10/15/2018.  Symptomatically, she is recovering well from surgery.  She has a drain in place.  Plan: 1.   Labs today:  CBC with diff, CMP, Mg. 2.   Stage IB multifocal LEFT breast cancer Clinically, she continues to do well.   She is s/p prophylactic mastectomy with reconstruction on 12/09/2018.  She has a drain in place.  Phone follow-up with [name] today- he wishes to hold Kadcyla until drain removed next week. Schedule every 3 months echo on 12/27/2018. Continue tamoxifen. Anticipate cycle #7 Kadcyla on 12/25/2018. Calculate additional cycles needed to complete 1 year of adjuvant HER-2/neu directed therapy. 3.   Vision changes Patient has had no further visual changes due to Kadcyla   Patient to report any visual changes between treatments   Continue to monitor closely.   4.   History of abnormal LFTs AST, ALT, bilirubin and alkaline phosphatase are normal. Continue to monitor with each cycle. 5.   Echo on 12/27/2018. 6.  Kadcyla postponed today per surgery. 7.   RTC on 12/25/2018 (Wednesday) for labs (CBC with diff, CMP), and Kadcyla. 8.   RTC on 01/15/2019 for MD assessment, labs (CBC with diff, CMP, CMP, Mg), and Kadcyla.   Addendum: Patient has received 45 of a planned 52 weeks of HER-2/neu directed therapy  up to today's date.  Will ask pharmacy to calculate.  I discussed the assessment and treatment plan with the patient.  The patient was provided an opportunity to ask questions and all were answered.  The patient agreed with the plan and demonstrated an understanding of the instructions.  The patient was advised to call back if the symptoms worsen or if the condition fails to improve as anticipated.   Lequita Asal, MD, PhD    12/19/2018, 10:32 AM  I, Selena Batten, am acting as scribe for [employer]. Mike Gip, MD, PhD.  I,  C. Mike Gip, MD, have reviewed the above documentation for accuracy and completeness, and I agree with the above.

## 2018-12-17 NOTE — Progress Notes (Signed)
Patient ID: Denise [name], female    DOB: 08/06/1975, 43 y.o.   MRN: [id number]   Chief Complaint  Patient presents with  . Post-op Follow-up    for immediate (R) breast reconstruction w/ expander and Flex HD    HPI Denise [name] is a 43 y.o. female presents for postop follow-up after right-sided mastectomy with immediate reconstruction with placement of tissue expander and flex HD. She has been doing well and tolerating pain well.  Her incision site are healing well.  She has had some minimal drain output that is serosanguineous.  She denies any fevers or chills.  She is ready for an expansion today in the office.  Review of Systems  Constitutional: Negative for chills, diaphoresis, fatigue and fever.  HENT: Negative.   Respiratory: Negative.  Negative for cough and shortness of breath.   Cardiovascular: Negative.  Negative for chest pain and leg swelling.  Gastrointestinal: Negative.   Musculoskeletal: Negative.   Skin: Negative.   Neurological: Negative.    Objective:   Vitals:   12/17/18 1113  BP: 132/87  Pulse: 94  Temp: 98.2 F (36.8 C)  SpO2: 98%    Physical Exam  General: alert, calm, no acute distress HEENT: normocephalic, atraumatic. Neck: normal ROM Chest: symmetrical rise and fall Pulmonary: unlabored breathing Musculoskeletal: MAEx4 Neuro: A&O x3, calm, cooperative, steady gait Skin: no rashes, incisions healing well. Small blister on R breast.  Assessment & Plan:     ICD-10-CM   1. Acquired absence of right breast  Z90.11     We placed injectable saline in the Expander using a sterile technique: Right: 50 cc for a total of 200cc / 375 cc  Follow up in 1 week.   Kermit Balo Seleny Allbright, PA-C

## 2018-12-18 ENCOUNTER — Telehealth: Payer: Self-pay | Admitting: *Deleted

## 2018-12-18 NOTE — Telephone Encounter (Signed)
Patient answered NO to all the COVID19 pre screening questions.

## 2018-12-19 ENCOUNTER — Other Ambulatory Visit: Payer: Self-pay

## 2018-12-19 ENCOUNTER — Inpatient Hospital Stay: Payer: BC Managed Care – PPO | Attending: Hematology and Oncology | Admitting: Hematology and Oncology

## 2018-12-19 ENCOUNTER — Encounter: Payer: Self-pay | Admitting: Hematology and Oncology

## 2018-12-19 ENCOUNTER — Inpatient Hospital Stay: Payer: BC Managed Care – PPO

## 2018-12-19 ENCOUNTER — Telehealth: Payer: Self-pay

## 2018-12-19 VITALS — BP 121/81 | HR 76 | Temp 97.0°F | Resp 18 | Ht 67.0 in | Wt 184.3 lb

## 2018-12-19 DIAGNOSIS — Z170 Estrogen receptor positive status [ER+]: Secondary | ICD-10-CM | POA: Diagnosis not present

## 2018-12-19 DIAGNOSIS — Z803 Family history of malignant neoplasm of breast: Secondary | ICD-10-CM | POA: Diagnosis not present

## 2018-12-19 DIAGNOSIS — Z9221 Personal history of antineoplastic chemotherapy: Secondary | ICD-10-CM | POA: Diagnosis not present

## 2018-12-19 DIAGNOSIS — C773 Secondary and unspecified malignant neoplasm of axilla and upper limb lymph nodes: Secondary | ICD-10-CM

## 2018-12-19 DIAGNOSIS — Z800 Family history of malignant neoplasm of digestive organs: Secondary | ICD-10-CM | POA: Insufficient documentation

## 2018-12-19 DIAGNOSIS — Z5112 Encounter for antineoplastic immunotherapy: Secondary | ICD-10-CM | POA: Diagnosis not present

## 2018-12-19 DIAGNOSIS — C50419 Malignant neoplasm of upper-outer quadrant of unspecified female breast: Secondary | ICD-10-CM | POA: Diagnosis not present

## 2018-12-19 DIAGNOSIS — Z923 Personal history of irradiation: Secondary | ICD-10-CM | POA: Insufficient documentation

## 2018-12-19 DIAGNOSIS — Z79899 Other long term (current) drug therapy: Secondary | ICD-10-CM

## 2018-12-19 DIAGNOSIS — C50512 Malignant neoplasm of lower-outer quadrant of left female breast: Secondary | ICD-10-CM | POA: Diagnosis not present

## 2018-12-19 DIAGNOSIS — Z79810 Long term (current) use of selective estrogen receptor modulators (SERMs): Secondary | ICD-10-CM

## 2018-12-19 DIAGNOSIS — Z7289 Other problems related to lifestyle: Secondary | ICD-10-CM | POA: Diagnosis not present

## 2018-12-19 DIAGNOSIS — Z8042 Family history of malignant neoplasm of prostate: Secondary | ICD-10-CM | POA: Diagnosis not present

## 2018-12-19 DIAGNOSIS — R7989 Other specified abnormal findings of blood chemistry: Secondary | ICD-10-CM | POA: Insufficient documentation

## 2018-12-19 DIAGNOSIS — Z8041 Family history of malignant neoplasm of ovary: Secondary | ICD-10-CM | POA: Diagnosis not present

## 2018-12-19 DIAGNOSIS — Z9011 Acquired absence of right breast and nipple: Secondary | ICD-10-CM | POA: Diagnosis not present

## 2018-12-19 DIAGNOSIS — C50412 Malignant neoplasm of upper-outer quadrant of left female breast: Secondary | ICD-10-CM

## 2018-12-19 DIAGNOSIS — Z7189 Other specified counseling: Secondary | ICD-10-CM

## 2018-12-19 LAB — CBC WITH DIFFERENTIAL/PLATELET
Abs Immature Granulocytes: 0.02 10*3/uL (ref 0.00–0.07)
Basophils Absolute: 0.1 10*3/uL (ref 0.0–0.1)
Basophils Relative: 1 %
Eosinophils Absolute: 0.3 10*3/uL (ref 0.0–0.5)
Eosinophils Relative: 6 %
HCT: 36.1 % (ref 36.0–46.0)
Hemoglobin: 12.2 g/dL (ref 12.0–15.0)
Immature Granulocytes: 0 %
Lymphocytes Relative: 27 %
Lymphs Abs: 1.5 10*3/uL (ref 0.7–4.0)
MCH: 28.5 pg (ref 26.0–34.0)
MCHC: 33.8 g/dL (ref 30.0–36.0)
MCV: 84.3 fL (ref 80.0–100.0)
Monocytes Absolute: 0.5 10*3/uL (ref 0.1–1.0)
Monocytes Relative: 8 %
Neutro Abs: 3.2 10*3/uL (ref 1.7–7.7)
Neutrophils Relative %: 58 %
Platelets: 214 10*3/uL (ref 150–400)
RBC: 4.28 MIL/uL (ref 3.87–5.11)
RDW: 14 % (ref 11.5–15.5)
WBC: 5.6 10*3/uL (ref 4.0–10.5)
nRBC: 0 % (ref 0.0–0.2)

## 2018-12-19 LAB — COMPREHENSIVE METABOLIC PANEL
ALT: 22 U/L (ref 0–44)
AST: 23 U/L (ref 15–41)
Albumin: 3.8 g/dL (ref 3.5–5.0)
Alkaline Phosphatase: 64 U/L (ref 38–126)
Anion gap: 8 (ref 5–15)
BUN: 15 mg/dL (ref 6–20)
CO2: 24 mmol/L (ref 22–32)
Calcium: 9 mg/dL (ref 8.9–10.3)
Chloride: 103 mmol/L (ref 98–111)
Creatinine, Ser: 0.64 mg/dL (ref 0.44–1.00)
GFR calc Af Amer: >60 mL/min (ref >60)
GFR calc non Af Amer: >60 mL/min (ref >60)
Glucose, Bld: 99 mg/dL (ref 70–99)
Potassium: 3.7 mmol/L (ref 3.5–5.1)
Sodium: 135 mmol/L (ref 135–145)
Total Bilirubin: 0.8 mg/dL (ref 0.3–1.2)
Total Protein: 7.3 g/dL (ref 6.5–8.1)

## 2018-12-19 LAB — MAGNESIUM: Magnesium: 1.8 mg/dL (ref 1.7–2.4)

## 2018-12-19 LAB — PREGNANCY, URINE: Preg Test, Ur: NEGATIVE

## 2018-12-19 MED ORDER — SODIUM CHLORIDE 0.9% FLUSH
10.0000 mL | INTRAVENOUS | Status: DC | PRN
  Administered 2018-12-19: 10:00:00 10 mL via INTRAVENOUS
  Filled 2018-12-19: qty 10

## 2018-12-19 MED ORDER — ACETAMINOPHEN 325 MG PO TABS: 650.0000 mg | ORAL_TABLET | Freq: Once | ORAL | Status: DC

## 2018-12-19 MED ORDER — SODIUM CHLORIDE 0.9 % IV SOLN: 3.6000 mg/kg | Freq: Once | INTRAVENOUS | Status: DC

## 2018-12-19 MED ORDER — SODIUM CHLORIDE 0.9 % IV SOLN
Freq: Once | INTRAVENOUS | Status: DC
  Filled 2018-12-19: qty 250

## 2018-12-19 MED ORDER — HEPARIN SOD (PORK) LOCK FLUSH 100 UNIT/ML IV SOLN
500.0000 [IU] | Freq: Once | INTRAVENOUS | Status: AC
  Administered 2018-12-19: 11:00:00 500 [IU] via INTRAVENOUS

## 2018-12-19 MED ORDER — DIPHENHYDRAMINE HCL 25 MG PO CAPS: 50.0000 mg | ORAL_CAPSULE | Freq: Once | ORAL | Status: DC

## 2018-12-19 NOTE — Telephone Encounter (Signed)
Permission obtained from Annia Belt to have patient's echo rescheduled. Spoke with Pamala Hurry in scheduling to have appt. Changed.   Contacted patient to inform her that her Echo is scheduled for Wednesday, 12/25/18 at 12:00 PM. Patient verbalizes understanding.

## 2018-12-19 NOTE — Progress Notes (Signed)
No new changes noted today 

## 2018-12-20 ENCOUNTER — Encounter: Payer: Self-pay | Admitting: Hematology and Oncology

## 2018-12-23 ENCOUNTER — Telehealth: Payer: Self-pay | Admitting: Plastic Surgery

## 2018-12-23 NOTE — Telephone Encounter (Signed)

## 2018-12-24 ENCOUNTER — Encounter: Payer: Self-pay | Admitting: General Surgery

## 2018-12-24 ENCOUNTER — Ambulatory Visit: Payer: BC Managed Care – PPO | Admitting: Physical Therapy

## 2018-12-24 ENCOUNTER — Other Ambulatory Visit: Payer: Self-pay

## 2018-12-24 ENCOUNTER — Ambulatory Visit (INDEPENDENT_AMBULATORY_CARE_PROVIDER_SITE_OTHER): Payer: BC Managed Care – PPO | Admitting: Surgical

## 2018-12-24 ENCOUNTER — Ambulatory Visit (INDEPENDENT_AMBULATORY_CARE_PROVIDER_SITE_OTHER): Payer: BC Managed Care – PPO | Admitting: General Surgery

## 2018-12-24 VITALS — BP 129/84 | HR 85 | Temp 98.1°F | Ht 67.0 in | Wt 183.0 lb

## 2018-12-24 VITALS — BP 133/85 | HR 103 | Temp 97.7°F | Resp 16 | Ht 67.0 in | Wt 186.0 lb

## 2018-12-24 DIAGNOSIS — Z9011 Acquired absence of right breast and nipple: Secondary | ICD-10-CM

## 2018-12-24 DIAGNOSIS — Z170 Estrogen receptor positive status [ER+]: Secondary | ICD-10-CM

## 2018-12-24 DIAGNOSIS — C50212 Malignant neoplasm of upper-inner quadrant of left female breast: Secondary | ICD-10-CM

## 2018-12-24 NOTE — Progress Notes (Signed)
Patient ID: Denise [name], female    DOB: 1976-04-06, 43 y.o.   MRN: [id number]   Chief Complaint  Patient presents with  . Follow-up    HPI Denise [name] is a 43 y.o. female here for follow up after right mastectomy with immediate reconstruction with placement of expander.  She is feeling well today, she has had minimal drain output over the past week or so.  In the office today she had approximately 6 mL in her drain that appeared to be serosanguineous.  Her incisions are healing well.  After her last expansion on 12/17/2018 she had minimal if any pain and would like another expansion today in the office.  No signs of infection, seroma or hematoma.  The small blister from the circular bandage is improved and does not appear to be infected.   Review of Systems  Constitutional: Negative for activity change, appetite change, chills, diaphoresis and fever.  Respiratory: Negative.   Cardiovascular: Negative.   Gastrointestinal: Negative.   Skin: Negative.   Neurological: Negative.   Psychiatric/Behavioral: Negative.     Objective:   Vitals:   12/24/18 1210  BP: 129/84  Pulse: 85  Temp: 98.1 F (36.7 C)  SpO2: 98%    Physical Exam Constitutional:      General: She is not in acute distress.    Appearance: Normal appearance. She is normal weight. She is not ill-appearing.  HENT:     Head: Normocephalic and atraumatic.  Eyes:     Conjunctiva/sclera: Conjunctivae normal.  Neck:     Musculoskeletal: Normal range of motion.  Cardiovascular:     Rate and Rhythm: Normal rate.     Pulses: Normal pulses.  Pulmonary:     Effort: Pulmonary effort is normal.  Abdominal:     General: There is no distension.  Skin:    General: Skin is warm and dry.     Findings: No erythema or rash.  Neurological:     General: No focal deficit present.     Mental Status: She is alert and oriented to person, place, and time.  Psychiatric:        Mood and Affect: Mood normal.      Behavior: Behavior normal.    Assessment & Plan:     ICD-10-CM   1. Acquired absence of right breast  Z90.11    [name] is doing very well.  Her incisions are healing nicely, she has no signs of infection.  Her left breast skin over the expander is taut but appears to be responding well to previous expansions.  Her right breast is doing very well and we expanded it today.  She was able to tolerate 70 cc of saline.  We placed injectable saline in the Expander using a sterile technique: Right: 70cc for a total of 270 / 375 cc  Return in about 2 weeks (around 01/07/2019).  Kermit Balo Doyal Saric, PA-C

## 2018-12-24 NOTE — Progress Notes (Signed)
Patient ID: KARISSMA CATHER, female   DOB: [birth year], 43 y.o.   MRN: [id number]  Chief Complaint  Patient presents with  . Routine Post Op    Right mastectomy    HPI YAIRI SABAS is a 43 y.o. female.  Here today for right breast mastectomy post operative care. Minimal pain. No swelling. Drain removed today. HPI  Past Medical History:  Diagnosis Date  . Abnormal breast biopsy    PASH, Dr. Lemar Livings  . Breast cancer ([facility]) 11/2017   left breast; ER/PR/Her2neu POS  . Family history of breast cancer   . Family history of ovarian cancer   . Fibroadenoma of breast, left 2000, 2016  . Genetic screening 08/26/2013   BRCA/BART negative/Myriad, CHEK2 POS 2017  . Increased risk of breast cancer 2017   IBIS=47%  . Left breast lump 06/18/2017   Fluid/drained J Cynthea Zachman  . Migraine   . Monoallelic mutation of CHEK2 gene in female patient 2017   increased risk of breast and colon cancer  . Personal history of chemotherapy   . Personal history of radiation therapy     Past Surgical History:  Procedure Laterality Date  . BREAST BIOPSY Bilateral 09/08/14   neg  . BREAST BIOPSY Left 03/16/2015   Procedure: BREAST BIOPSY WITH NEEDLE LOCALIZATION;  Surgeon: Earline Mayotte, MD;  Location: [facility];  Service: General;  Laterality: Left;  . BREAST BIOPSY Left 11/2017   positive  . BREAST CYST ASPIRATION Left 06/18/2017   cyst aspiration  . BREAST EXCISIONAL BIOPSY Left 09/2014  . BREAST EXCISIONAL BIOPSY Right    age 29's  . BREAST RECONSTRUCTION WITH PLACEMENT OF TISSUE EXPANDER AND FLEX HD (ACELLULAR HYDRATED DERMIS) Left 05/20/2018   Procedure: BREAST RECONSTRUCTION WITH PLACEMENT OF TISSUE EXPANDER AND FLEX HD (ACELLULAR HYDRATED DERMIS);  Surgeon: Peggye Form, DO;  Location: [facility];  Service: Plastics;  Laterality: Left;  . BREAST RECONSTRUCTION WITH PLACEMENT OF TISSUE EXPANDER AND FLEX HD (ACELLULAR HYDRATED DERMIS) Right 12/09/2018   Procedure: BREAST RECONSTRUCTION WITH  PLACEMENT OF TISSUE EXPANDER AND FLEX HD (ACELLULAR HYDRATED DERMIS);  Surgeon: Peggye Form, DO;  Location: [facility];  Service: Plastics;  Laterality: Right;  . BREAST SURGERY Right March 2000   benign fibroadenoma  . BREAST SURGERY Left 08/08/13   excision  . BREAST SURGERY Left 09/23/14   excision  . MASTECTOMY Left 05/20/2018  . MASTECTOMY W/ SENTINEL NODE BIOPSY Left 05/20/2018   Procedure: MASTECTOMY WITH SENTINEL LYMPH NODE BIOPSY;  Surgeon: Earline Mayotte, MD;  Location: [facility];  Service: General;  Laterality: Left;  . PORTACATH PLACEMENT Right 12/17/2017   Procedure: INSERTION PORT-A-CATH;  Surgeon: Earline Mayotte, MD;  Location: [facility];  Service: General;  Laterality: Right;  . SIMPLE MASTECTOMY WITH AXILLARY SENTINEL NODE BIOPSY Right 12/09/2018   Procedure: SIMPLE MASTECTOMY RIGHT;  Surgeon: Earline Mayotte, MD;  Location: [facility];  Service: General;  Laterality: Right;    Family History  Problem Relation Age of Onset  . Prostate cancer Father 62       requiring tx/hormones  . Breast cancer Paternal Grandmother 7  . Breast cancer Maternal Aunt 60  . Ovarian cancer Maternal Aunt 70  . Breast cancer Paternal Aunt 51  . Pancreatic cancer Maternal Aunt 44    Social History Social History   Tobacco Use  . Smoking status: Never Smoker  . Smokeless tobacco: Never Used  Substance Use Topics  . Alcohol use: Yes    Comment:  occasionally  . Drug use: No    Allergies  Allergen Reactions  . Steri-Strip Compound Benzoin [Benzoin Compound]     Steri-strip ,   . Tape Other (See Comments)    Minor skin irritation     Current Outpatient Medications  Medication Sig Dispense Refill  . acetaminophen (TYLENOL) 500 MG tablet Take 500 mg by mouth every 6 (six) hours as needed for moderate pain or headache.    . diazepam (VALIUM) 2 MG tablet Take 1 tablet (2 mg total) by mouth every 6 (six) hours as needed for anxiety. 30 tablet 0  . lidocaine-prilocaine  (EMLA) cream Apply 1 application topically as needed (port access).    . magnesium oxide (MAG-OX) 400 MG tablet Take 1 tablet (400 mg total) by mouth daily. (Patient taking differently: Take 400 mg by mouth 2 (two) times a week. ) 30 tablet 0  . omeprazole (PRILOSEC OTC) 20 MG tablet Take 20 mg by mouth daily as needed (for acid reflux).    . tamoxifen (NOLVADEX) 20 MG tablet Take 1 tablet (20 mg total) by mouth daily. 30 tablet 3  . cephALEXin (KEFLEX) 500 MG capsule Take 1 capsule (500 mg total) by mouth every 6 (six) hours. (Patient not taking: Reported on 12/19/2018) 30 capsule 0  . HYDROcodone-acetaminophen (NORCO) 5-325 MG tablet Take 1 tablet by mouth every 8 (eight) hours as needed for moderate pain. 20 tablet 0   No current facility-administered medications for this visit.    Facility-Administered Medications Ordered in Other Visits  Medication Dose Route Frequency Provider Last Rate Last Dose  . sodium chloride flush (NS) 0.9 % injection 10 mL  10 mL Intravenous PRN [name], MD   10 mL at 04/17/18 0844    Review of Systems Review of Systems  Constitutional: Negative.     Blood pressure 133/85, pulse (!) 103, temperature 97.7 F (36.5 C), temperature source Temporal, resp. rate 16, height 5\' 7"  (1.702 m), weight 186 lb (84.4 kg), last menstrual period 12/04/2018, SpO2 99 %.  Physical Exam Physical Exam Exam conducted with a chaperone present.  Constitutional:      Appearance: Normal appearance.  Pulmonary:     Effort: Pulmonary effort is normal.  Chest:    Skin:    General: Skin is warm and dry.  Neurological:     Mental Status: She is alert.     Data Reviewed Up to Date:  The risks for other CHEK2-associated cancers are not well defined. However, the 1100delC variant appears to be associated with an increased risk for colorectal cancer, particularly in the setting of a family history of colon cancer [78]. For example, the cumulative risk of colorectal  cancer for 1100delC carriers to age 43 is estimated to be 0.6 percent, with a cumulative lifetime risk to age 29 for all carriers of about 12 percent [79]. This compares with a lifetime risk in the general population of about 6 percent   Assessment Doing well post prophylactic mastectomy.  Plan We will call you in November to schedule the follow up appointment with Dr.Ikeisha Blumberg.  We will attempt to run down the molecular report confirming her Chek 2 mutation.  If so she would be a candidate for early colonoscopy.  Hopefully, medical oncology will allow her to have her port out at the end of her current treatment run, possibly during implant exchange on the right side later this year.  HPI, Physical Exam, Assessment and Plan have been scribed under the direction and  in the presence of Earline Mayotte, MD. Arn Medal, CMA  I have completed the exam and reviewed the above documentation for accuracy and completeness.  I agree with the above.  Museum/gallery conservator has been used and any errors in dictation or transcription are unintentional.  Donnalee Curry, M.D., F.A.C.S.  Merrily Pew Neveen Daponte 12/25/2018, 8:39 PM

## 2018-12-24 NOTE — Patient Instructions (Signed)
We will call you in November to schedule the follow up appointment with Dr.Byrnett.

## 2018-12-25 ENCOUNTER — Other Ambulatory Visit: Payer: Self-pay | Admitting: Hematology and Oncology

## 2018-12-25 ENCOUNTER — Inpatient Hospital Stay: Payer: BC Managed Care – PPO

## 2018-12-25 ENCOUNTER — Other Ambulatory Visit: Payer: Self-pay

## 2018-12-25 ENCOUNTER — Ambulatory Visit
Admission: RE | Admit: 2018-12-25 | Discharge: 2018-12-25 | Disposition: A | Discharge status: Home / Self Care | Payer: BC Managed Care – PPO | Source: Ambulatory Visit | Attending: Hematology and Oncology | Admitting: Hematology and Oncology

## 2018-12-25 VITALS — BP 134/88 | HR 91 | Temp 97.2°F | Resp 16

## 2018-12-25 DIAGNOSIS — Z9221 Personal history of antineoplastic chemotherapy: Secondary | ICD-10-CM | POA: Insufficient documentation

## 2018-12-25 DIAGNOSIS — C50512 Malignant neoplasm of lower-outer quadrant of left female breast: Secondary | ICD-10-CM | POA: Diagnosis not present

## 2018-12-25 DIAGNOSIS — C50419 Malignant neoplasm of upper-outer quadrant of unspecified female breast: Secondary | ICD-10-CM | POA: Insufficient documentation

## 2018-12-25 DIAGNOSIS — C50412 Malignant neoplasm of upper-outer quadrant of left female breast: Secondary | ICD-10-CM

## 2018-12-25 DIAGNOSIS — Z170 Estrogen receptor positive status [ER+]: Secondary | ICD-10-CM

## 2018-12-25 LAB — CBC WITH DIFFERENTIAL/PLATELET
Abs Immature Granulocytes: 0.02 10*3/uL (ref 0.00–0.07)
Basophils Absolute: 0.1 10*3/uL (ref 0.0–0.1)
Basophils Relative: 1 %
Eosinophils Absolute: 0.4 10*3/uL (ref 0.0–0.5)
Eosinophils Relative: 6 %
HCT: 34.6 % — ABNORMAL LOW (ref 36.0–46.0)
Hemoglobin: 11.8 g/dL — ABNORMAL LOW (ref 12.0–15.0)
Immature Granulocytes: 0 %
Lymphocytes Relative: 27 %
Lymphs Abs: 1.8 10*3/uL (ref 0.7–4.0)
MCH: 28.8 pg (ref 26.0–34.0)
MCHC: 34.1 g/dL (ref 30.0–36.0)
MCV: 84.4 fL (ref 80.0–100.0)
Monocytes Absolute: 0.4 10*3/uL (ref 0.1–1.0)
Monocytes Relative: 6 %
Neutro Abs: 3.9 10*3/uL (ref 1.7–7.7)
Neutrophils Relative %: 60 %
Platelets: 206 10*3/uL (ref 150–400)
RBC: 4.1 MIL/uL (ref 3.87–5.11)
RDW: 14.4 % (ref 11.5–15.5)
WBC: 6.4 10*3/uL (ref 4.0–10.5)
nRBC: 0 % (ref 0.0–0.2)

## 2018-12-25 LAB — COMPREHENSIVE METABOLIC PANEL
ALT: 20 U/L (ref 0–44)
AST: 23 U/L (ref 15–41)
Albumin: 3.9 g/dL (ref 3.5–5.0)
Alkaline Phosphatase: 60 U/L (ref 38–126)
Anion gap: 10 (ref 5–15)
BUN: 13 mg/dL (ref 6–20)
CO2: 23 mmol/L (ref 22–32)
Calcium: 8.9 mg/dL (ref 8.9–10.3)
Chloride: 103 mmol/L (ref 98–111)
Creatinine, Ser: 0.6 mg/dL (ref 0.44–1.00)
GFR calc Af Amer: >60 mL/min (ref >60)
GFR calc non Af Amer: >60 mL/min (ref >60)
Glucose, Bld: 93 mg/dL (ref 70–99)
Potassium: 3.6 mmol/L (ref 3.5–5.1)
Sodium: 136 mmol/L (ref 135–145)
Total Bilirubin: 0.8 mg/dL (ref 0.3–1.2)
Total Protein: 7.2 g/dL (ref 6.5–8.1)

## 2018-12-25 MED ORDER — HEPARIN SOD (PORK) LOCK FLUSH 100 UNIT/ML IV SOLN
500.0000 [IU] | Freq: Once | INTRAVENOUS | Status: AC
  Administered 2018-12-25: 16:00:00 500 [IU] via INTRAVENOUS

## 2018-12-25 MED ORDER — SODIUM CHLORIDE 0.9% FLUSH
10.0000 mL | INTRAVENOUS | Status: DC | PRN
  Administered 2018-12-25: 14:00:00 10 mL via INTRAVENOUS
  Filled 2018-12-25: qty 10

## 2018-12-25 MED ORDER — SODIUM CHLORIDE 0.9 % IV SOLN
Freq: Once | INTRAVENOUS | Status: AC
  Administered 2018-12-25: 15:00:00 via INTRAVENOUS
  Filled 2018-12-25: qty 250

## 2018-12-25 MED ORDER — DIPHENHYDRAMINE HCL 25 MG PO CAPS
50.0000 mg | ORAL_CAPSULE | Freq: Once | ORAL | Status: AC
  Administered 2018-12-25: 15:00:00 50 mg via ORAL
  Filled 2018-12-25: qty 2

## 2018-12-25 MED ORDER — SODIUM CHLORIDE 0.9 % IV SOLN
3.6000 mg/kg | Freq: Once | INTRAVENOUS | Status: AC
  Administered 2018-12-25: 15:00:00 300 mg via INTRAVENOUS
  Filled 2018-12-25: qty 15

## 2018-12-25 MED ORDER — ACETAMINOPHEN 325 MG PO TABS
650.0000 mg | ORAL_TABLET | Freq: Once | ORAL | Status: AC
  Administered 2018-12-25: 15:00:00 650 mg via ORAL
  Filled 2018-12-25: qty 2

## 2018-12-25 NOTE — Progress Notes (Signed)
*  PRELIMINARY RESULTS* Echocardiogram 2D Echocardiogram has been performed.  Denise [name] 12/25/2018, 12:55 PM

## 2018-12-25 NOTE — Patient Instructions (Signed)
Ado-Trastuzumab Emtansine for injection What is this medicine? ADO-TRASTUZUMAB EMTANSINE (ADD oh traz TOO zuh mab em TAN zine) is a monoclonal antibody combined with chemotherapy. It is used to treat breast cancer. This medicine may be used for other purposes; ask your health care provider or pharmacist if you have questions. COMMON BRAND NAME(S): Kadcyla What should I tell my health care provider before I take this medicine? They need to know if you have any of these conditions:  heart disease  heart failure  infection (especially a virus infection such as chickenpox, cold sores, or herpes)  liver disease  lung or breathing disease, like asthma  tingling of the fingers or toes, or other nerve disorder  an unusual or allergic reaction to ado-trastuzumab emtansine, other medications, foods, dyes, or preservatives  pregnant or trying to get pregnant  breast-feeding How should I use this medicine? This medicine is for infusion into a vein. It is given by a health care professional in a hospital or clinic setting. Talk to your pediatrician regarding the use of this medicine in children. Special care may be needed. Overdosage: If you think you have taken too much of this medicine contact a poison control center or emergency room at once. NOTE: This medicine is only for you. Do not share this medicine with others. What if I miss a dose? It is important not to miss your dose. Call your doctor or health care professional if you are unable to keep an appointment. What may interact with this medicine? This medicine may also interact with the following medications:  atazanavir  boceprevir  clarithromycin  delavirdine  indinavir  dalfopristin; quinupristin  isoniazid, INH  itraconazole  ketoconazole  nefazodone  nelfinavir  ritonavir  telaprevir  telithromycin  tipranavir  voriconazole This list may not describe all possible interactions. Give your health care  provider a list of all the medicines, herbs, non-prescription drugs, or dietary supplements you use. Also tell them if you smoke, drink alcohol, or use illegal drugs. Some items may interact with your medicine. What should I watch for while using this medicine? Visit your doctor for checks on your progress. This drug may make you feel generally unwell. This is not uncommon, as chemotherapy can affect healthy cells as well as cancer cells. Report any side effects. Continue your course of treatment even though you feel ill unless your doctor tells you to stop. You may need blood work done while you are taking this medicine. Call your doctor or health care professional for advice if you get a fever, chills or sore throat, or other symptoms of a cold or flu. Do not treat yourself. This drug decreases your body's ability to fight infections. Try to avoid being around people who are sick. Be careful brushing and flossing your teeth or using a toothpick because you may get an infection or bleed more easily. If you have any dental work done, tell your dentist you are receiving this medicine. Avoid taking products that contain aspirin, acetaminophen, ibuprofen, naproxen, or ketoprofen unless instructed by your doctor. These medicines may hide a fever. Do not become pregnant while taking this medicine or for 7 months after stopping it, men with female partners should use contraception during treatment and for 4 months after the last dose. Women should inform their doctor if they wish to become pregnant or think they might be pregnant. There is a potential for serious side effects to an unborn child. Do not breast-feed an infant while taking this medicine or   for 7 months after the last dose. Men who have a partner who is pregnant or who is capable of becoming pregnant should use a condom during sexual activity while taking this medicine and for 4 months after stopping it. Men should inform their doctors if they wish to  father a child. This medicine may lower sperm counts. Talk to your health care professional or pharmacist for more information. What side effects may I notice from receiving this medicine? Side effects that you should report to your doctor or health care professional as soon as possible:  allergic reactions like skin rash, itching or hives, swelling of the face, lips, or tongue  breathing problems  chest pain or palpitations  fever or chills, sore throat  general ill feeling or flu-like symptoms  light-colored stools  nausea, vomiting  pain, tingling, numbness in the hands or feet  signs and symptoms of bleeding such as bloody or black, tarry stools; red or dark-brown urine; spitting up blood or brown material that looks like coffee grounds; red spots on the skin; unusual bruising or bleeding from the eye, gums, or nose  swelling of the legs or ankles  yellowing of the eyes or skin Side effects that usually do not require medical attention (report to your doctor or health care professional if they continue or are bothersome):  changes in taste  constipation  dizziness  headache  joint pain  muscle pain  trouble sleeping  unusually weak or tired This list may not describe all possible side effects. Call your doctor for medical advice about side effects. You may report side effects to FDA at 1-800-FDA-1088. Where should I keep my medicine? This drug is given in a hospital or clinic and will not be stored at home. NOTE: This sheet is a summary. It may not cover all possible information. If you have questions about this medicine, talk to your doctor, pharmacist, or health care provider.  2020 Elsevier/Gold Standard (2017-11-02 10:03:15)  

## 2018-12-26 ENCOUNTER — Ambulatory Visit: Payer: BC Managed Care – PPO | Admitting: Physical Therapy

## 2018-12-26 ENCOUNTER — Ambulatory Visit: Payer: BC Managed Care – PPO

## 2018-12-27 ENCOUNTER — Ambulatory Visit: Payer: BC Managed Care – PPO | Attending: Plastic Surgery | Admitting: Physical Therapy

## 2018-12-27 ENCOUNTER — Encounter: Payer: Self-pay | Admitting: Physical Therapy

## 2018-12-27 ENCOUNTER — Other Ambulatory Visit: Payer: Self-pay

## 2018-12-27 DIAGNOSIS — M25512 Pain in left shoulder: Secondary | ICD-10-CM | POA: Diagnosis present

## 2018-12-27 DIAGNOSIS — M25511 Pain in right shoulder: Secondary | ICD-10-CM | POA: Diagnosis not present

## 2018-12-27 NOTE — Therapy (Signed)
[facility] [address], [place], [address] Phone: [phone]   Fax:  [phone]  Physical Therapy Treatment  Patient Details  Name: Denise [name] MRN: [id number] Date of Birth: 1975/07/17 Referring Provider (PT): D[place]   Encounter Date: 12/27/2018  PT End of Session - 12/27/18 1114    Visit Number  12    Number of Visits  33    Date for PT Re-Evaluation  02/21/19    PT Start Time  5916    PT Stop Time  1130    PT Time Calculation (min)  45 min    Activity Tolerance  Patient tolerated treatment well    Behavior During Therapy  [facility] for tasks assessed/performed       Past Medical History:  Diagnosis Date  . Abnormal breast biopsy    PASH, [name]  . Breast cancer ([place]) 11/2017   left breast; ER/PR/Her2neu POS  . Family history of breast cancer   . Family history of ovarian cancer   . Fibroadenoma of breast, left 2000, 2016  . Genetic screening 08/26/2013   BRCA/BART negative/[facility]riad, CHEK2 POS 2017  . Increased risk of breast cancer 2017   IBIS=47%  . Left breast lump 06/18/2017   Fluid/drained [name]  . Migraine   . Monoallelic mutation of CHEK2 gene in female patient 2017   increased risk of breast and colon cancer  . Personal history of chemotherapy   . Personal history of radiation therapy     Past Surgical History:  Procedure Laterality Date  . BREAST BIOPSY Bilateral 09/08/14   neg  . BREAST BIOPSY Left 03/16/2015   Procedure: BREAST BIOPSY WITH NEEDLE LOCALIZATION;  Surgeon: [name], MD;  Location: [facility];  Service: General;  Laterality: Left;  . BREAST BIOPSY Left 11/2017   positive  . BREAST CYST ASPIRATION Left 06/18/2017   cyst aspiration  . BREAST EXCISIONAL BIOPSY Left 09/2014  . BREAST EXCISIONAL BIOPSY Right    age 89's  . BREAST RECONSTRUCTION WITH PLACEMENT OF TISSUE EXPANDER AND FLEX HD (ACELLULAR HYDRATED DERMIS) Left 05/20/2018   Procedure:  BREAST RECONSTRUCTION WITH PLACEMENT OF TISSUE EXPANDER AND FLEX HD (ACELLULAR HYDRATED DERMIS);  Surgeon: [name], DO;  Location: [facility];  Service: Plastics;  Laterality: Left;  . BREAST RECONSTRUCTION WITH PLACEMENT OF TISSUE EXPANDER AND FLEX HD (ACELLULAR HYDRATED DERMIS) Right 12/09/2018   Procedure: BREAST RECONSTRUCTION WITH PLACEMENT OF TISSUE EXPANDER AND FLEX HD (ACELLULAR HYDRATED DERMIS);  Surgeon: [name], DO;  Location: [facility];  Service: Plastics;  Laterality: Right;  . BREAST SURGERY Right March 2000   benign fibroadenoma  . BREAST SURGERY Left 08/08/13   excision  . BREAST SURGERY Left 09/23/14   excision  . MASTECTOMY Left 05/20/2018  . MASTECTOMY W/ SENTINEL NODE BIOPSY Left 05/20/2018   Procedure: MASTECTOMY WITH SENTINEL LYMPH NODE BIOPSY;  Surgeon: [name], MD;  Location: [facility];  Service: General;  Laterality: Left;  . PORTACATH PLACEMENT Right 12/17/2017   Procedure: INSERTION PORT-A-CATH;  Surgeon: [name], MD;  Location: [facility];  Service: General;  Laterality: Right;  . SIMPLE MASTECTOMY WITH AXILLARY SENTINEL NODE BIOPSY Right 12/09/2018   Procedure: SIMPLE MASTECTOMY RIGHT;  Surgeon: [name], MD;  Location: [facility];  Service: General;  Laterality: Right;    There were no vitals filed for this visit.  Subjective Assessment - 12/27/18 1048    Subjective  Patient reports  her motion "pretty good" on both sides with some increased tension on RUE with overhead. Reports 5/10 pain with overhead motion more on the RUE.    Pertinent History  Patient is a 43 year old female with breast cancer history beginning in 2016 with multiple benign lumpectomies. Patient reports first malignant finding this past June 2019 in L breast, with following masectomy with saline expander placed 05/20/18. Patient reports chemo prior with radiation beginning next week 5days/week, for 6 weeks. Patient reports she is also planning to have R  masectomy with saline expansion in the future, unscheduled. 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 Patient reports she is a mother of a 18 year old and 51 year old that she reports are very involved in extra-curriculars. Patient reports her ROM in the shoulder is improving, but that  she is having some pain in the inside elbow and into medial forearm with straightening. Patient reports some TTP at incision site, but most all pain at elbow into forearm. Worst pain in past week 7/10 and best 0/10.  Reports pain is sharp in the medial elbow and forearm; denies numbness or tingling    Limitations  House hold activities;Lifting    How long can you sit comfortably?  unlimited    How long can you stand comfortably?  unlimited    How long can you walk comfortably?  unlimited                Manual STM withtrigger point releaseTo L pec minor/major, latissimus/teres minor, PROM into all planes44mn in flex, abd, IR, and ER inc ROM as able(focus on ER) GHJAPgrade III 30sec bouts 6bouts, and with ER for 6 additional bouts, bilat UE with full ER following, approx 50d LLE and 60d RLE before  Increased ROM throughout manual techniques and decreased pain noted from patient following.ER approx 45d, flex and abd approx 30d increase for full PROM and AROM in all planes following  Ther-Ex - Doorway pec stretch 10sec hold x 3 90/90; good carry over following demo - Standing lat stretch with overhead pull up bar 10sec hold x3 with demo for proper form - Lat pulldown 20# 3x 10 with min cuing for initial set up good carry over following - UE ranger stepping in and out of shoulder abd x10 with 5-10 sec holds - UE ranger clockwise x10 counterclockwise x10                                PT Education - 12/27/18 1111    Education Details  Exercise form    Person(s) Educated  Patient    Methods  Explanation;Demonstration;Tactile cues;Verbal cues     Comprehension  Verbal cues required;Tactile cues required;Returned demonstration;Verbalized understanding       PT Short Term Goals - 06/21/18 1143      PT SHORT TERM GOAL #1   Title  Pt will be independent with HEP in order to improve strength and motion, and to decrease pain in order to improve pain-free function at home and work.    Time  4    Period  Weeks    Status  New        PT Long Term Goals - 12/27/18 1049      PT LONG TERM GOAL #1   Title  Pt will decrease worst pain as reported on NPRS by at least 3 points in order to demonstrate  clinically significant reduction in pain.    Baseline  12/27/18 worst pain 6/10  following RUE expander placement    Time  8    Period  Weeks    Status  Revised      PT LONG TERM GOAL #2   Title  Patient will increase FOTO score to 72 to demonstrate predicted increase in functional mobility to complete ADLs    Baseline  10/24/18     Time  8      PT LONG TERM GOAL #3   Title  Patient will demonstrate all active, painfree shoulder ROM within normal limits in order to complete ADLs    Baseline  12/27/18 flex:R: 138d L: 145d with "stretch pain" at axilla Abd: R: 95d; L: 170d with pain directly under breast and at edge of extender; IR T10; ER C8    Time  8    Period  Weeks    Status  Revised            Plan - 12/27/18 1132    Clinical Impression Statement  Pt returns to PT following absence for R extender placement. Patinet with impairments in R shoulder ROM, length/tension imbalances, and pain. Goals reassessed and revised to reflect this. Patient with good response to manual therapy with increased motion following, able to complete therex with proper form following demo and cuing. PT will continue progression as able to restore motion and function.    Stability/Clinical Decision Making  Evolving/Moderate complexity    Clinical Decision Making  Moderate    Rehab Potential  Good    Clinical Impairments Affecting Rehab Potential  (+) age,  motivation, social support, (-) current sedentary lifestyle, ongoing cancer treatment, other comorbidities    PT Frequency  2x / week    PT Duration  8 weeks    PT Treatment/Interventions  ADLs/Self Care Home Management;Aquatic Therapy;Electrical Stimulation;Cryotherapy;Moist Heat;Iontophoresis 7m/ml Dexamethasone;Functional mobility training;Therapeutic activities;Therapeutic exercise;Traction;Ultrasound;Neuromuscular re-education;Manual techniques;Patient/family education;Manual lymph drainage;Passive range of motion;Dry needling;Energy conservation;Taping    PT Next Visit Plan  Ease soft tissue restrictions, increase ROM as able    PT Home Exercise Plan  pulleys shoulder abd/flex, sidelying lat stretch, supine ER stretch, scar massage    Consulted and Agree with Plan of Care  Patient       Patient will benefit from skilled therapeutic intervention in order to improve the following deficits and impairments:  Decreased activity tolerance, Decreased endurance, Decreased range of motion, Decreased skin integrity, Hypomobility, Increased fascial restricitons, Impaired UE functional use, Improper body mechanics, Pain, Postural dysfunction, Impaired flexibility, Decreased scar mobility  Visit Diagnosis: 1. Acute pain of right shoulder   2. Acute pain of left shoulder        Problem List Patient Active Problem List   Diagnosis Date Noted  . Acquired absence of right breast 12/17/2018  . Admission for breast reconstruction following mastectomy 12/09/2018  . Use of tamoxifen (Nolvadex) 12/04/2018  . Acquired absence of left breast 09/03/2018  . Visual changes 08/20/2018  . Breast asymmetry following reconstructive surgery 08/13/2018  . Hot flashes due to menopause 06/18/2018  . Status post left mastectomy 06/04/2018  . S/P breast reconstruction 05/28/2018  . Vasomotor symptoms due to menopause 04/17/2018  . Elevated LFTs 03/17/2018  . Goals of care, counseling/discussion 03/17/2018  .  Hypomagnesemia 03/04/2018  . Diarrhea 01/02/2018  . Encounter for antineoplastic chemotherapy 12/21/2017  . Encounter for antineoplastic immunotherapy 12/21/2017  . 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 Breast mass, right  12/05/2016  . Monoallelic mutation of CHEK2 gene in female patient 06/20/2015  . Malignant neoplasm of upper-outer quadrant of female breast ([place]) 09/17/2014   Shelton Silvas PT, DPT Shelton Silvas 12/27/2018, 11:40 AM  [facility] [address], [place], [address] Phone: [phone]   Fax:  [phone]  Name: Denise [name] MRN: [id number] Date of Birth: 1975/08/30

## 2018-12-31 ENCOUNTER — Other Ambulatory Visit: Payer: Self-pay

## 2018-12-31 ENCOUNTER — Encounter: Payer: Self-pay | Admitting: Physical Therapy

## 2018-12-31 ENCOUNTER — Ambulatory Visit: Payer: BC Managed Care – PPO | Admitting: Physical Therapy

## 2018-12-31 DIAGNOSIS — M25511 Pain in right shoulder: Secondary | ICD-10-CM | POA: Diagnosis not present

## 2018-12-31 DIAGNOSIS — M25512 Pain in left shoulder: Secondary | ICD-10-CM

## 2018-12-31 NOTE — Therapy (Signed)
[facility] [address] [place], [address] Phone: [phone]   Fax:  [phone]  Physical Therapy Treatment  Patient Details  Name: Denise [name] MRN: [id number] Date of Birth: Dec 31, 1975 Referring Provider (PT): Dillingham   Encounter Date: 12/31/2018  PT End of Session - 12/31/18 1535    Visit Number  13    Number of Visits  33    Date for PT Re-Evaluation  02/21/19    PT Start Time  0315    PT Stop Time  0400    PT Time Calculation (min)  45 min    Activity Tolerance  Patient tolerated treatment well    Behavior During Therapy  [facility] for tasks assessed/performed       Past Medical History:  Diagnosis Date  . Abnormal breast biopsy    PASH, [name]  . Breast cancer ([place]) 11/2017   left breast; ER/PR/Her2neu POS  . Family history of breast cancer   . Family history of ovarian cancer   . Fibroadenoma of breast, left 2000, 2016  . Genetic screening 08/26/2013   BRCA/BART negative/Myriad, CHEK2 POS 2017  . Increased risk of breast cancer 2017   IBIS=47%  . Left breast lump 06/18/2017   Fluid/drained [name]  . Migraine   . Monoallelic mutation of CHEK2 gene in female patient 2017   increased risk of breast and colon cancer  . Personal history of chemotherapy   . Personal history of radiation therapy     Past Surgical History:  Procedure Laterality Date  . BREAST BIOPSY Bilateral 09/08/14   neg  . BREAST BIOPSY Left 03/16/2015   Procedure: BREAST BIOPSY WITH NEEDLE LOCALIZATION;  Surgeon: [name], MD;  Location: [facility] ORS;  Service: General;  Laterality: Left;  . BREAST BIOPSY Left 11/2017   positive  . BREAST CYST ASPIRATION Left 06/18/2017   cyst aspiration  . BREAST EXCISIONAL BIOPSY Left 09/2014  . BREAST EXCISIONAL BIOPSY Right    age 65's  . BREAST RECONSTRUCTION WITH PLACEMENT OF TISSUE EXPANDER AND FLEX HD (ACELLULAR HYDRATED DERMIS) Left 05/20/2018   Procedure:  BREAST RECONSTRUCTION WITH PLACEMENT OF TISSUE EXPANDER AND FLEX HD (ACELLULAR HYDRATED DERMIS);  Surgeon: [name], DO;  Location: [facility] ORS;  Service: Plastics;  Laterality: Left;  . BREAST RECONSTRUCTION WITH PLACEMENT OF TISSUE EXPANDER AND FLEX HD (ACELLULAR HYDRATED DERMIS) Right 12/09/2018   Procedure: BREAST RECONSTRUCTION WITH PLACEMENT OF TISSUE EXPANDER AND FLEX HD (ACELLULAR HYDRATED DERMIS);  Surgeon: [name], DO;  Location: [facility] ORS;  Service: Plastics;  Laterality: Right;  . BREAST SURGERY Right March 2000   benign fibroadenoma  . BREAST SURGERY Left 08/08/13   excision  . BREAST SURGERY Left 09/23/14   excision  . MASTECTOMY Left 05/20/2018  . MASTECTOMY W/ SENTINEL NODE BIOPSY Left 05/20/2018   Procedure: MASTECTOMY WITH SENTINEL LYMPH NODE BIOPSY;  Surgeon: [name], MD;  Location: [facility];  Service: General;  Laterality: Left;  . PORTACATH PLACEMENT Right 12/17/2017   Procedure: INSERTION PORT-A-CATH;  Surgeon: [name], MD;  Location: [facility];  Service: General;  Laterality: Right;  . SIMPLE MASTECTOMY WITH AXILLARY SENTINEL NODE BIOPSY Right 12/09/2018   Procedure: SIMPLE MASTECTOMY RIGHT;  Surgeon: [name], MD;  Location: [facility];  Service: General;  Laterality: Right;    There were no vitals filed for this visit.  Subjective Assessment - 12/31/18 1517    Subjective  Reports she  is having some pain directly at extender edge into ant shoulder joint. reports 4/10 pain in this area.    Pertinent History  Patient is a 43 year old female with breast cancer history beginning in 2016 with multiple benign lumpectomies. Patient reports first malignant finding this past June 2019 in L breast, with following masectomy with saline expander placed 05/20/18. Patient reports chemo prior with radiation beginning next week 5days/week, for 6 weeks. Patient reports she is also planning to have R masectomy with saline expansion in the future,  unscheduled. 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 Patient reports she is a mother of a 39 year old and 41 year old that she reports are very involved in extra-curriculars. Patient reports her ROM in the shoulder is improving, but that  she is having some pain in the inside elbow and into medial forearm with straightening. Patient reports some TTP at incision site, but most all pain at elbow into forearm. Worst pain in past week 7/10 and best 0/10.  Reports pain is sharp in the medial elbow and forearm; denies numbness or tingling    Limitations  House hold activities;Lifting    How long can you sit comfortably?  unlimited    How long can you stand comfortably?  unlimited    How long can you walk comfortably?  unlimited    Patient Stated Goals  Decrease pain    Pain Onset  1 to 4 weeks ago          Manual STM withtrigger point releaseTo L pec minor/major, latissimus/teres minor, PROM into all planes60mn in flex, abd, IR, and ER inc ROM as able(focus on ER) GHJAPgrade III 30sec bouts 6bouts, and with ER for 6 additional bouts, bilat UE with full ER following, approx 50d LLE and 60d RLE before Increased ROM throughout manual techniques and decreased pain noted from patient following.ER approx 45d, flex and abd approx 30d increase for full PROM and AROM in all planes following  Ther-Ex - Lat pulldown 25# 3x 10 with good carry over from previous sessions - Bent over rows 15# x10 20# 2x 10 with demo initially for set up and proper form with neutral spine, good carry over following - GTB D2 PNF pattern 3x 10 each UE with good carry over of proper form following demo and cuing - Attempted scaption with GTB resistance at wrist with "popping sensation" of bicep tendon x6 discontinued; Wall slides GTB 2x 8  - Bicep curl &# DB each 3x 10 with min cuing for eccentric control with good carry over following                        PT Education  - 12/31/18 1534    Education Details  Exercise form    Person(s) Educated  Patient    Methods  Explanation;Demonstration;Tactile cues;Verbal cues    Comprehension  Verbalized understanding;Returned demonstration;Verbal cues required;Tactile cues required       PT Short Term Goals - 06/21/18 1143      PT SHORT TERM GOAL #1   Title  Pt will be independent with HEP in order to improve strength and motion, and to decrease pain in order to improve pain-free function at home and work.    Time  4    Period  Weeks    Status  New        PT Long Term Goals - 12/27/18 1049  PT LONG TERM GOAL #1   Title  Pt will decrease worst pain as reported on NPRS by at least 3 points in order to demonstrate clinically significant reduction in pain.    Baseline  12/27/18 worst pain 6/10  following RUE expander placement    Time  8    Period  Weeks    Status  Revised      PT LONG TERM GOAL #2   Title  Patient will increase FOTO score to 72 to demonstrate predicted increase in functional mobility to complete ADLs    Baseline  10/24/18     Time  8      PT LONG TERM GOAL #3   Title  Patient will demonstrate all active, painfree shoulder ROM within normal limits in order to complete ADLs    Baseline  12/27/18 flex:R: 138d L: 145d with "stretch pain" at axilla Abd: R: 95d; L: 170d with pain directly under breast and at edge of extender; IR T10; ER C8    Time  8    Period  Weeks    Status  Revised            Plan - 12/31/18 1607    Clinical Impression Statement  PT continued to utilize manual techniques for increased ROM and decreased soft tissue tension. PT continued therex progression for UE and postural muscle strengthening, and scapulo-humeral rhythm. Pt is able to complete all therex with accuracy following some cuing, modification, and demonstration. PT will continue progression as able, and pain management as need.    Stability/Clinical Decision Making  Evolving/Moderate complexity     Clinical Decision Making  Moderate    Rehab Potential  Good    Clinical Impairments Affecting Rehab Potential  (+) age, motivation, social support, (-) current sedentary lifestyle, ongoing cancer treatment, other comorbidities    PT Treatment/Interventions  ADLs/Self Care Home Management;Aquatic Therapy;Electrical Stimulation;Cryotherapy;Moist Heat;Iontophoresis 31m/ml Dexamethasone;Functional mobility training;Therapeutic activities;Therapeutic exercise;Traction;Ultrasound;Neuromuscular re-education;Manual techniques;Patient/family education;Manual lymph drainage;Passive range of motion;Dry needling;Energy conservation;Taping    PT Next Visit Plan  Ease soft tissue restrictions, increase ROM as able    PT Home Exercise Plan  pulleys shoulder abd/flex, sidelying lat stretch, supine ER stretch, scar massage    Consulted and Agree with Plan of Care  Patient       Patient will benefit from skilled therapeutic intervention in order to improve the following deficits and impairments:  Decreased activity tolerance, Decreased endurance, Decreased range of motion, Decreased skin integrity, Hypomobility, Increased fascial restricitons, Impaired UE functional use, Improper body mechanics, Pain, Postural dysfunction, Impaired flexibility, Decreased scar mobility  Visit Diagnosis: 1. Acute pain of left shoulder   2. Acute pain of right shoulder        Problem List Patient Active Problem List   Diagnosis Date Noted  . Acquired absence of right breast 12/17/2018  . Admission for breast reconstruction following mastectomy 12/09/2018  . Use of tamoxifen (Nolvadex) 12/04/2018  . Acquired absence of left breast 09/03/2018  . Visual changes 08/20/2018  . Breast asymmetry following reconstructive surgery 08/13/2018  . Hot flashes due to menopause 06/18/2018  . Status post left mastectomy 06/04/2018  . S/P breast reconstruction 05/28/2018  . Vasomotor symptoms due to menopause 04/17/2018  . Elevated LFTs  03/17/2018  . Goals of care, counseling/discussion 03/17/2018  . Hypomagnesemia 03/04/2018  . Diarrhea 01/02/2018  . Encounter for antineoplastic chemotherapy 12/21/2017  . Encounter for antineoplastic immunotherapy 12/21/2017  . 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 Breast  mass, right 12/05/2016  . Monoallelic mutation of CHEK2 gene in female patient 06/20/2015  . Malignant neoplasm of upper-outer quadrant of female breast ([place]) 09/17/2014   Shelton Silvas PT, DPT Shelton Silvas 12/31/2018, 4:18 PM  [place] [facility] [address] [place], [address] Phone: [phone]   Fax:  [phone]  Name: Denise [name] MRN: [id number] Date of Birth: 1975/08/02

## 2019-01-02 ENCOUNTER — Ambulatory Visit: Payer: BC Managed Care – PPO | Admitting: Physical Therapy

## 2019-01-02 ENCOUNTER — Other Ambulatory Visit: Payer: Self-pay

## 2019-01-02 ENCOUNTER — Encounter: Payer: Self-pay | Admitting: Hematology and Oncology

## 2019-01-02 ENCOUNTER — Encounter: Payer: Self-pay | Admitting: Physical Therapy

## 2019-01-02 DIAGNOSIS — C50419 Malignant neoplasm of upper-outer quadrant of unspecified female breast: Secondary | ICD-10-CM

## 2019-01-02 DIAGNOSIS — M25512 Pain in left shoulder: Secondary | ICD-10-CM

## 2019-01-02 DIAGNOSIS — M25511 Pain in right shoulder: Secondary | ICD-10-CM

## 2019-01-02 MED ORDER — TAMOXIFEN CITRATE 20 MG PO TABS: 20.0000 mg | ORAL_TABLET | Freq: Every day | ORAL | 3 refills | Status: DC

## 2019-01-02 NOTE — Therapy (Signed)
[place] [facility] [address] [place], [address] Phone: [phone]   Fax:  [phone]  Physical Therapy Treatment  Patient Details  Name: Denise [name] MRN: [id number] Date of Birth: Apr 26, 1976 Referring Provider (PT): Dillingham   Encounter Date: 01/02/2019  PT End of Session - 01/02/19 1821    Visit Number  14    Number of Visits  33    Date for PT Re-Evaluation  02/21/19    PT Start Time  0545    PT Stop Time  0625    PT Time Calculation (min)  40 min    Activity Tolerance  Patient tolerated treatment well    Behavior During Therapy  [facility] for tasks assessed/performed       Past Medical History:  Diagnosis Date  . Abnormal breast biopsy    PASH, [name]  . Breast cancer ([place]) 11/2017   left breast; ER/PR/Her2neu POS  . Family history of breast cancer   . Family history of ovarian cancer   . Fibroadenoma of breast, left 2000, 2016  . Genetic screening 08/26/2013   BRCA/BART negative/Myriad, CHEK2 POS 2017  . Increased risk of breast cancer 2017   IBIS=47%  . Left breast lump 06/18/2017   Fluid/drained [name]  . Migraine   . Monoallelic mutation of CHEK2 gene in female patient 2017   increased risk of breast and colon cancer  . Personal history of chemotherapy   . Personal history of radiation therapy     Past Surgical History:  Procedure Laterality Date  . BREAST BIOPSY Bilateral 09/08/14   neg  . BREAST BIOPSY Left 03/16/2015   Procedure: BREAST BIOPSY WITH NEEDLE LOCALIZATION;  Surgeon: [name], MD;  Location: [facility];  Service: General;  Laterality: Left;  . BREAST BIOPSY Left 11/2017   positive  . BREAST CYST ASPIRATION Left 06/18/2017   cyst aspiration  . BREAST EXCISIONAL BIOPSY Left 09/2014  . BREAST EXCISIONAL BIOPSY Right    age 79's  . BREAST RECONSTRUCTION WITH PLACEMENT OF TISSUE EXPANDER AND FLEX HD (ACELLULAR HYDRATED DERMIS) Left 05/20/2018   Procedure:  BREAST RECONSTRUCTION WITH PLACEMENT OF TISSUE EXPANDER AND FLEX HD (ACELLULAR HYDRATED DERMIS);  Surgeon: [name], DO;  Location: [facility];  Service: Plastics;  Laterality: Left;  . BREAST RECONSTRUCTION WITH PLACEMENT OF TISSUE EXPANDER AND FLEX HD (ACELLULAR HYDRATED DERMIS) Right 12/09/2018   Procedure: BREAST RECONSTRUCTION WITH PLACEMENT OF TISSUE EXPANDER AND FLEX HD (ACELLULAR HYDRATED DERMIS);  Surgeon: [name], DO;  Location: [facility];  Service: Plastics;  Laterality: Right;  . BREAST SURGERY Right March 2000   benign fibroadenoma  . BREAST SURGERY Left 08/08/13   excision  . BREAST SURGERY Left 09/23/14   excision  . MASTECTOMY Left 05/20/2018  . MASTECTOMY W/ SENTINEL NODE BIOPSY Left 05/20/2018   Procedure: MASTECTOMY WITH SENTINEL LYMPH NODE BIOPSY;  Surgeon: [name], MD;  Location: [facility];  Service: General;  Laterality: Left;  . PORTACATH PLACEMENT Right 12/17/2017   Procedure: INSERTION PORT-A-CATH;  Surgeon: [name], MD;  Location: [facility];  Service: General;  Laterality: Right;  . SIMPLE MASTECTOMY WITH AXILLARY SENTINEL NODE BIOPSY Right 12/09/2018   Procedure: SIMPLE MASTECTOMY RIGHT;  Surgeon: [name], MD;  Location: [facility];  Service: General;  Laterality: Right;    There were no vitals filed for this visit.  Subjective Assessment - 01/02/19 1750    Subjective  Reports some  axilla pain and pec tension. 4/10 pain this afternoon    Pertinent History  Patient is a 43 year old female with breast cancer history beginning in 2016 with multiple benign lumpectomies. Patient reports first malignant finding this past June 2019 in L breast, with following masectomy with saline expander placed 05/20/18. Patient reports chemo prior with radiation beginning next week 5days/week, for 6 weeks. Patient reports she is also planning to have R masectomy with saline expansion in the future, unscheduled. 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 Patient reports she is a mother of a 64 year old and 25 year old that she reports are very involved in extra-curriculars. Patient reports her ROM in the shoulder is improving, but that  she is having some pain in the inside elbow and into medial forearm with straightening. Patient reports some TTP at incision site, but most all pain at elbow into forearm. Worst pain in past week 7/10 and best 0/10.  Reports pain is sharp in the medial elbow and forearm; denies numbness or tingling    How long can you sit comfortably?  unlimited    How long can you stand comfortably?  unlimited    How long can you walk comfortably?  unlimited    Patient Stated Goals  Decrease pain         Manual STM withtrigger point releaseTo L pec minor/major, latissimus/teres minor, PROM into all planes78mn in flex, abd, IR, and ER inc ROM as able(focus on ER) GHJAPgrade III 30sec bouts 6bouts, and with ER for 6 additional bouts, bilat UE with full ER following, approx 50d LLE and 60d RLE before Increased ROM throughout manual techniques and decreased pain noted from patient following. Sidelying lat stretch 10sec x5  Ther-Ex - TRX lat stretch 2x 30sec hold - Row with pronation to supination 5# x10; 10# 2x 10 with min cuing initially for proper technique with good carry over following - High row 20# 3x 8 with min cuing to prevent shoulder hiking with good carry over following - 90/90 shoulder ER 5# 3x 6 with cuing to maintain scapular retraction                         PT Education - 01/02/19 1819    Education Details  Exercise form    Person(s) Educated  Patient    Methods  Explanation;Demonstration;Tactile cues;Verbal cues    Comprehension  Verbalized understanding;Returned demonstration;Verbal cues required;Tactile cues required       PT Short Term Goals - 06/21/18 1143      PT SHORT TERM GOAL #1   Title  Pt will be independent with HEP in order  to improve strength and motion, and to decrease pain in order to improve pain-free function at home and work.    Time  4    Period  Weeks    Status  New        PT Long Term Goals - 12/27/18 1049      PT LONG TERM GOAL #1   Title  Pt will decrease worst pain as reported on NPRS by at least 3 points in order to demonstrate clinically significant reduction in pain.    Baseline  12/27/18 worst pain 6/10  following RUE expander placement    Time  8    Period  Weeks    Status  Revised      PT LONG TERM GOAL #2   Title  Patient will increase FOTO score to 72 to demonstrate predicted increase in functional mobility to complete ADLs    Baseline  10/24/18     Time  8      PT LONG TERM GOAL #3   Title  Patient will demonstrate all active, painfree shoulder ROM within normal limits in order to complete ADLs    Baseline  12/27/18 flex:R: 138d L: 145d with "stretch pain" at axilla Abd: R: 95d; L: 170d with pain directly under breast and at edge of extender; IR T10; ER C8    Time  8    Period  Weeks    Status  Revised            Plan - 01/02/19 1828    Clinical Impression Statement  PT continued therex progression for postural musculature. Patinet is able to complete all therex with good carry over following. Patient reports decreased pain following manual techniques. PT educated patient on stretching and breathing techniques to decreased intercostal tension; patient verbalizes understanding.    Stability/Clinical Decision Making  Evolving/Moderate complexity    Clinical Decision Making  Moderate    Rehab Potential  Good    Clinical Impairments Affecting Rehab Potential  (+) age, motivation, social support, (-) current sedentary lifestyle, ongoing cancer treatment, other comorbidities    PT Frequency  2x / week    PT Duration  8 weeks    PT Treatment/Interventions  ADLs/Self Care Home Management;Aquatic Therapy;Electrical Stimulation;Cryotherapy;Moist Heat;Iontophoresis 84m/ml  Dexamethasone;Functional mobility training;Therapeutic activities;Therapeutic exercise;Traction;Ultrasound;Neuromuscular re-education;Manual techniques;Patient/family education;Manual lymph drainage;Passive range of motion;Dry needling;Energy conservation;Taping    PT Next Visit Plan  Ease soft tissue restrictions, increase ROM as able    PT Home Exercise Plan  pulleys shoulder abd/flex, sidelying lat stretch, supine ER stretch, scar massage    Consulted and Agree with Plan of Care  Patient       Patient will benefit from skilled therapeutic intervention in order to improve the following deficits and impairments:  Decreased activity tolerance, Decreased endurance, Decreased range of motion, Decreased skin integrity, Hypomobility, Increased fascial restricitons, Impaired UE functional use, Improper body mechanics, Pain, Postural dysfunction, Impaired flexibility, Decreased scar mobility  Visit Diagnosis: 1. Acute pain of left shoulder   2. Acute pain of right shoulder        Problem List Patient Active Problem List   Diagnosis Date Noted  . Acquired absence of right breast 12/17/2018  . Admission for breast reconstruction following mastectomy 12/09/2018  . Use of tamoxifen (Nolvadex) 12/04/2018  . Acquired absence of left breast 09/03/2018  . Visual changes 08/20/2018  . Breast asymmetry following reconstructive surgery 08/13/2018  . Hot flashes due to menopause 06/18/2018  . Status post left mastectomy 06/04/2018  . S/P breast reconstruction 05/28/2018  . Vasomotor symptoms due to menopause 04/17/2018  . Elevated LFTs 03/17/2018  . Goals of care, counseling/discussion 03/17/2018  . Hypomagnesemia 03/04/2018  . Diarrhea 01/02/2018  . Encounter for antineoplastic chemotherapy 12/21/2017  . Encounter for antineoplastic immunotherapy 12/21/2017  . 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 Breast mass, right 12/05/2016  . Monoallelic mutation of CHEK2 gene in female  patient 06/20/2015  . [place] 09[place]17[place]   CShelton SilvasPT, DPT CShelton Silvas7/16/2020, 6:38 PM  [facility] [address] [place] [address]Phone: [phone]  Fax:  [phone] Name: Denise PICHAMRN: [id number] of Birth: 104/22/1977

## 2019-01-06 ENCOUNTER — Telehealth: Payer: Self-pay | Admitting: Plastic Surgery

## 2019-01-06 NOTE — Telephone Encounter (Signed)

## 2019-01-07 ENCOUNTER — Other Ambulatory Visit: Payer: Self-pay

## 2019-01-07 ENCOUNTER — Encounter: Payer: Self-pay | Admitting: Physical Therapy

## 2019-01-07 ENCOUNTER — Ambulatory Visit: Payer: BC Managed Care – PPO | Admitting: Physical Therapy

## 2019-01-07 ENCOUNTER — Ambulatory Visit (INDEPENDENT_AMBULATORY_CARE_PROVIDER_SITE_OTHER): Payer: BC Managed Care – PPO | Admitting: Plastic Surgery

## 2019-01-07 ENCOUNTER — Encounter: Payer: Self-pay | Admitting: Plastic Surgery

## 2019-01-07 VITALS — BP 139/95 | HR 89 | Temp 98.4°F | Wt 185.0 lb

## 2019-01-07 DIAGNOSIS — M25511 Pain in right shoulder: Secondary | ICD-10-CM

## 2019-01-07 DIAGNOSIS — Z9011 Acquired absence of right breast and nipple: Secondary | ICD-10-CM

## 2019-01-07 DIAGNOSIS — Z9012 Acquired absence of left breast and nipple: Secondary | ICD-10-CM

## 2019-01-07 DIAGNOSIS — Z9889 Other specified postprocedural states: Secondary | ICD-10-CM

## 2019-01-07 DIAGNOSIS — M25512 Pain in left shoulder: Secondary | ICD-10-CM

## 2019-01-07 DIAGNOSIS — Z9882 Breast implant status: Secondary | ICD-10-CM

## 2019-01-07 NOTE — Therapy (Signed)
[facility] [address] [place], [address] Phone: [phone]   Fax:  [phone]  Physical Therapy Treatment  Patient Details  Name: Denise [name] MRN: [id number] Date of Birth: 05-26-1976 Referring Provider (PT): Dillingham   Encounter Date: 01/07/2019  PT End of Session - 01/07/19 1802    Visit Number  15    Number of Visits  33    Date for PT Re-Evaluation  02/21/19    PT Start Time  0545    PT Stop Time  0630    PT Time Calculation (min)  45 min    Activity Tolerance  Patient tolerated treatment well    Behavior During Therapy  [facility] for tasks assessed/performed       Past Medical History:  Diagnosis Date  . Abnormal breast biopsy    [facility], [name]  . Breast cancer ([place]) 11/2017   left breast; ER/PR/Her2neu POS  . Family history of breast cancer   . Family history of ovarian cancer   . Fibroadenoma of breast, left 2000, 2016  . Genetic screening 08/26/2013   BRCA/BART negative/[facility], CHEK2 POS 2017  . Increased risk of breast cancer 2017   IBIS=47%  . Left breast lump 06/18/2017   Fluid/drained [name]  . Migraine   . Monoallelic mutation of CHEK2 gene in female patient 2017   increased risk of breast and colon cancer  . Personal history of chemotherapy   . Personal history of radiation therapy     Past Surgical History:  Procedure Laterality Date  . BREAST BIOPSY Bilateral 09/08/14   neg  . BREAST BIOPSY Left 03/16/2015   Procedure: BREAST BIOPSY WITH NEEDLE LOCALIZATION;  Surgeon: [name], MD;  Location: [facility] ORS;  Service: General;  Laterality: Left;  . BREAST BIOPSY Left 11/2017   positive  . BREAST CYST ASPIRATION Left 06/18/2017   cyst aspiration  . BREAST EXCISIONAL BIOPSY Left 09/2014  . BREAST EXCISIONAL BIOPSY Right    age 43's  . BREAST RECONSTRUCTION WITH PLACEMENT OF TISSUE EXPANDER AND FLEX HD (ACELLULAR HYDRATED DERMIS) Left 05/20/2018   Procedure:  BREAST RECONSTRUCTION WITH PLACEMENT OF TISSUE EXPANDER AND FLEX HD (ACELLULAR HYDRATED DERMIS);  Surgeon: [name], DO;  Location: [facility] OR[facility];  Service: Plastics;  Laterality: Left;  . BREAST RECONSTRUCTION WITH PLACEMENT OF TISSUE EXPANDER AND FLEX HD (ACELLULAR HYDRATED DERMIS) Right 12/09/2018   Procedure: BREAST RECONSTRUCTION WITH PLACEMENT OF TISSUE EXPANDER AND FLEX HD (ACELLULAR HYDRATED DERMIS);  Surgeon: [name], DO;  Location: [facility] OR[facility];  Service: Plastics;  Laterality: Right;  . BREAST SURGERY Right March 2000   benign fibroadenoma  . BREAST SURGERY Left 08/08/13   excision  . BREAST SURGERY Left 09/23/14   excision  . MASTECTOMY Left 05/20/2018  . MASTECTOMY W/ SENTINEL NODE BIOPSY Left 05/20/2018   Procedure: MASTECTOMY WITH SENTINEL LYMPH NODE BIOPSY;  Surgeon: [name], MD;  Location: [facility];  Service: General;  Laterality: Left;  . PORTACATH PLACEMENT Right 12/17/2017   Procedure: INSERTION PORT-A-CATH;  Surgeon: [name], MD;  Location: [facility];  Service: General;  Laterality: Right;  . SIMPLE MASTECTOMY WITH AXILLARY SENTINEL NODE BIOPSY Right 12/09/2018   Procedure: SIMPLE MASTECTOMY RIGHT;  Surgeon: [name], MD;  Location: [facility];  Service: General;  Laterality: Right;    There were no vitals filed for this visit.  Subjective Assessment - 01/07/19 1746    Subjective  Reports some  increased tension following 48m fill today. 4/10 pain today at pec and axilla    Pertinent History  Patient is a 43year old female with breast cancer history beginning in 2016 with multiple benign lumpectomies. Patient reports first malignant finding this past June 2019 in L breast, with following masectomy with saline expander placed 05/20/18. Patient reports chemo prior with radiation beginning next week 5days/week, for 6 weeks. Patient reports she is also planning to have R masectomy with saline expansion in the future, unscheduled. Patient  works as a lCamera operatorfull time at an [employer] Patient reports she is a mother of a 126year old and 979year old that she reports are very involved in extra-curriculars. Patient reports her ROM in the shoulder is improving, but that  she is having some pain in the inside elbow and into medial forearm with straightening. Patient reports some TTP at incision site, but most all pain at elbow into forearm. Worst pain in past week 7/10 and best 0/10.  Reports pain is sharp in the medial elbow and forearm; denies numbness or tingling    Limitations  House hold activities;Lifting    How long can you sit comfortably?  unlimited    How long can you stand comfortably?  unlimited    How long can you walk comfortably?  unlimited    Patient Stated Goals  Decrease pain       Manual STM withtrigger point releaseTo L pec minor/major, latissimus/teres minor, PROM into all planes428m in flex, abd, IR, and ER inc ROM as able(focus on ER) GHJAPgrade III 30sec bouts 6bouts, and with ER for 6 additional bouts, bilat UE with full ER following, approx 50d LLE and 60d RLE before Increased ROM throughout manual techniques and decreased pain noted from patient following. Sidelying lat stretch 10sec x5  Ther-Ex - Supine pec stretch on 1/2 foam - TRX lat stretch 2x 30sec hold - Lat pulldown 25# x10; 35# 2x 10 with good carry over of proper form - Wall slides GTB 3x 10 with min cuing to maintain proper form throughout with carry over following - Scapular push up on wall 3x 10 with cuing and demo initially for proper form with good carry over following                        PT Education - 01/07/19 1802    Education Details  Exercise form    Person(s) Educated  Patient    Methods  Explanation;Demonstration;Tactile cues;Verbal cues    Comprehension  Verbalized understanding;Returned demonstration;Verbal cues required;Tactile cues required       PT Short Term Goals -  06/21/18 1143      PT SHORT TERM GOAL #1   Title  Pt will be independent with HEP in order to improve strength and motion, and to decrease pain in order to improve pain-free function at home and work.    Time  4    Period  Weeks    Status  New        PT Long Term Goals - 12/27/18 1049      PT LONG TERM GOAL #1   Title  Pt will decrease worst pain as reported on NPRS by at least 3 points in order to demonstrate clinically significant reduction in pain.    Baseline  12/27/18 worst pain 6/10  following RUE expander placement    Time  8    Period  Weeks    Status  Revised      PT LONG TERM GOAL #2   Title  Patient will increase FOTO score to 72 to demonstrate predicted increase in functional mobility to complete ADLs    Baseline  10/24/18     Time  8      PT LONG TERM GOAL #3   Title  Patient will demonstrate all active, painfree shoulder ROM within normal limits in order to complete ADLs    Baseline  12/27/18 flex:R: 138d L: 145d with "stretch pain" at axilla Abd: R: 95d; L: 170d with pain directly under breast and at edge of extender; IR T10; ER C8    Time  8    Period  Weeks    Status  Revised            Plan - 01/07/19 1830    Clinical Impression Statement  PT continued to utilize manual decrease to decrease muscle tension and increase ROM. Patient with some increased LUE restrictions to date as well that resolve well with manual techniques. PT continued therex progression for periscapular stregthening and scapulo-humeral rhythm, patient able to complete all therex with accuracy following demo and cuing from PT. Will continue progression as able to restore function and decrease pain and restirctions.    Stability/Clinical Decision Making  Evolving/Moderate complexity    Clinical Decision Making  Moderate    Rehab Potential  Good    Clinical Impairments Affecting Rehab Potential  (+) age, motivation, social support, (-) current sedentary lifestyle, ongoing cancer treatment,  other comorbidities    PT Frequency  2x / week    PT Duration  8 weeks    PT Treatment/Interventions  ADLs/Self Care Home Management;Aquatic Therapy;Electrical Stimulation;Cryotherapy;Moist Heat;Iontophoresis 41m/ml Dexamethasone;Functional mobility training;Therapeutic activities;Therapeutic exercise;Traction;Ultrasound;Neuromuscular re-education;Manual techniques;Patient/family education;Manual lymph drainage;Passive range of motion;Dry needling;Energy conservation;Taping    PT Next Visit Plan  Ease soft tissue restrictions, increase ROM as able    PT Home Exercise Plan  pulleys shoulder abd/flex, sidelying lat stretch, supine ER stretch, scar massage    Consulted and Agree with Plan of Care  Patient       Patient will benefit from skilled therapeutic intervention in order to improve the following deficits and impairments:  Decreased activity tolerance, Decreased endurance, Decreased range of motion, Decreased skin integrity, Hypomobility, Increased fascial restricitons, Impaired UE functional use, Improper body mechanics, Pain, Postural dysfunction, Impaired flexibility, Decreased scar mobility  Visit Diagnosis: 1. Acute pain of left shoulder   2. Acute pain of right shoulder        Problem List Patient Active Problem List   Diagnosis Date Noted  . Acquired absence of right breast 12/17/2018  . Admission for breast reconstruction following mastectomy 12/09/2018  . Use of tamoxifen (Nolvadex) 12/04/2018  . Acquired absence of left breast 09/03/2018  . Visual changes 08/20/2018  . Breast asymmetry following reconstructive surgery 08/13/2018  . Hot flashes due to menopause 06/18/2018  . Status post left mastectomy 06/04/2018  . S/P breast reconstruction 05/28/2018  . Vasomotor symptoms due to menopause 04/17/2018  . Elevated LFTs 03/17/2018  . Goals of care, counseling/discussion 03/17/2018  . Hypomagnesemia 03/04/2018  . Diarrhea 01/02/2018  . Encounter for antineoplastic  chemotherapy 12/21/2017  . Encounter for antineoplastic immunotherapy 12/21/2017  . 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 Breast mass, right 12/05/2016  . Monoallelic mutation of CHEK2 gene in female patient 06/20/2015  . [place]   CShelton SilvasPT, DPT [facility]  Sabra Heck 01/07/2019, 6:33 PM  [facility] [address], [place], [address] Phone: [phone]   Fax:  [phone]  Name: Denise [name] MRN: [id number] Date of Birth: 11-16-1975

## 2019-01-07 NOTE — Progress Notes (Signed)
   Subjective:    Patient ID: Denise [name], female    DOB: [birth year], 43 y.o.   MRN: [id number]  The patient is a 43 year old female here for postop follow-up on her right breast surgery.  She had a mastectomy with expander placement.  She is doing very well with her fills.  There is no sign of infection, hematoma or seroma.  The incision is healing nicely.  She has a nice shape.  She has already undergone the left mastectomy with expander placement.  She is doing well with that.  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  That expander has 450 cc.   Review of Systems  Constitutional: Negative for activity change and appetite change.  Eyes: Negative.   Respiratory: Negative.  Negative for chest tightness and shortness of breath.   Cardiovascular: Negative.  Negative for leg swelling.  Gastrointestinal: Negative.   Genitourinary: Negative.        Objective:   Physical Exam Vitals signs and nursing note reviewed.  Constitutional:      Appearance: Normal appearance.  HENT:     Head: Normocephalic and atraumatic.  Cardiovascular:     Rate and Rhythm: Normal rate.  Pulmonary:     Effort: Pulmonary effort is normal.  Abdominal:     General: Abdomen is flat.  Neurological:     General: No focal deficit present.     Mental Status: She is alert. Mental status is at baseline.  Psychiatric:        Mood and Affect: Mood normal.        Behavior: Behavior normal.         Assessment & Plan:     ICD-10-CM   1. S/P breast reconstruction  Z98.82   2. 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 Acquired absence of left breast  Z90.12     We placed injectable saline in the Expander using a sterile technique: Right: 50 cc for a total of 320 / 375 cc Left:  450 / 375 cc Follow-up in 2 weeks and we will continue expanding.

## 2019-01-08 ENCOUNTER — Other Ambulatory Visit: Payer: Self-pay

## 2019-01-08 ENCOUNTER — Emergency Department: Payer: BC Managed Care – PPO

## 2019-01-08 ENCOUNTER — Observation Stay
Admission: EM | Admit: 2019-01-08 | Discharge: 2019-01-09 | Disposition: A | Discharge status: Home / Self Care | Payer: BC Managed Care – PPO | Attending: Internal Medicine | Admitting: Internal Medicine

## 2019-01-08 DIAGNOSIS — F419 Anxiety disorder, unspecified: Secondary | ICD-10-CM | POA: Diagnosis not present

## 2019-01-08 DIAGNOSIS — Z853 Personal history of malignant neoplasm of breast: Secondary | ICD-10-CM | POA: Insufficient documentation

## 2019-01-08 DIAGNOSIS — Z9013 Acquired absence of bilateral breasts and nipples: Secondary | ICD-10-CM | POA: Diagnosis not present

## 2019-01-08 DIAGNOSIS — Z1159 Encounter for screening for other viral diseases: Secondary | ICD-10-CM | POA: Insufficient documentation

## 2019-01-08 DIAGNOSIS — Z923 Personal history of irradiation: Secondary | ICD-10-CM | POA: Insufficient documentation

## 2019-01-08 DIAGNOSIS — K219 Gastro-esophageal reflux disease without esophagitis: Secondary | ICD-10-CM | POA: Insufficient documentation

## 2019-01-08 DIAGNOSIS — Z9221 Personal history of antineoplastic chemotherapy: Secondary | ICD-10-CM | POA: Diagnosis not present

## 2019-01-08 DIAGNOSIS — Z79899 Other long term (current) drug therapy: Secondary | ICD-10-CM | POA: Insufficient documentation

## 2019-01-08 DIAGNOSIS — R040 Epistaxis: Secondary | ICD-10-CM | POA: Diagnosis present

## 2019-01-08 DIAGNOSIS — I10 Essential (primary) hypertension: Secondary | ICD-10-CM | POA: Insufficient documentation

## 2019-01-08 LAB — HEMOGLOBIN AND HEMATOCRIT, BLOOD
HCT: 31.2 % — ABNORMAL LOW (ref 36.0–46.0)
Hemoglobin: 10.4 g/dL — ABNORMAL LOW (ref 12.0–15.0)

## 2019-01-08 LAB — COMPREHENSIVE METABOLIC PANEL
ALT: 23 U/L (ref 0–44)
AST: 28 U/L (ref 15–41)
Albumin: 4.1 g/dL (ref 3.5–5.0)
Alkaline Phosphatase: 59 U/L (ref 38–126)
Anion gap: 10 (ref 5–15)
BUN: 14 mg/dL (ref 6–20)
CO2: 26 mmol/L (ref 22–32)
Calcium: 9.3 mg/dL (ref 8.9–10.3)
Chloride: 105 mmol/L (ref 98–111)
Creatinine, Ser: 0.64 mg/dL (ref 0.44–1.00)
GFR calc Af Amer: >60 mL/min (ref >60)
GFR calc non Af Amer: >60 mL/min (ref >60)
Glucose, Bld: 129 mg/dL — ABNORMAL HIGH (ref 70–99)
Potassium: 4.1 mmol/L (ref 3.5–5.1)
Sodium: 141 mmol/L (ref 135–145)
Total Bilirubin: 0.7 mg/dL (ref 0.3–1.2)
Total Protein: 7.9 g/dL (ref 6.5–8.1)

## 2019-01-08 LAB — CBC
HCT: 38.6 % (ref 36.0–46.0)
Hemoglobin: 12.7 g/dL (ref 12.0–15.0)
MCH: 28.1 pg (ref 26.0–34.0)
MCHC: 32.9 g/dL (ref 30.0–36.0)
MCV: 85.4 fL (ref 80.0–100.0)
Platelets: 217 10*3/uL (ref 150–400)
RBC: 4.52 MIL/uL (ref 3.87–5.11)
RDW: 14.4 % (ref 11.5–15.5)
WBC: 5.9 10*3/uL (ref 4.0–10.5)
nRBC: 0 % (ref 0.0–0.2)

## 2019-01-08 LAB — PROTIME-INR
INR: 0.9 (ref 0.8–1.2)
Prothrombin Time: 12.2 seconds (ref 11.4–15.2)

## 2019-01-08 LAB — SARS CORONAVIRUS 2 BY RT PCR (HOSPITAL ORDER, PERFORMED IN ~~LOC~~ HOSPITAL LAB): SARS Coronavirus 2: NEGATIVE

## 2019-01-08 LAB — APTT: aPTT: 25 seconds (ref 24–36)

## 2019-01-08 MED ORDER — HYDROMORPHONE HCL 1 MG/ML IJ SOLN
1.0000 mg | Freq: Once | INTRAMUSCULAR | Status: AC
  Administered 2019-01-08: 1 mg via INTRAMUSCULAR

## 2019-01-08 MED ORDER — LORAZEPAM 2 MG/ML IJ SOLN
2.0000 mg | Freq: Once | INTRAMUSCULAR | Status: AC
  Administered 2019-01-08: 11:00:00 2 mg via INTRAVENOUS
  Filled 2019-01-08: qty 1

## 2019-01-08 MED ORDER — DOXYCYCLINE HYCLATE 100 MG PO TABS
100.0000 mg | ORAL_TABLET | Freq: Two times a day (BID) | ORAL | Status: DC
  Administered 2019-01-08 – 2019-01-09 (×2): 100 mg via ORAL
  Filled 2019-01-08 (×2): qty 1

## 2019-01-08 MED ORDER — TAMOXIFEN CITRATE 10 MG PO TABS
20.0000 mg | ORAL_TABLET | Freq: Every day | ORAL | Status: DC
  Administered 2019-01-08 – 2019-01-09 (×2): 20 mg via ORAL
  Filled 2019-01-08 (×2): qty 2

## 2019-01-08 MED ORDER — LIDOCAINE-PRILOCAINE 2.5-2.5 % EX CREA
TOPICAL_CREAM | Freq: Once | CUTANEOUS | Status: DC
  Filled 2019-01-08: qty 5

## 2019-01-08 MED ORDER — TRANEXAMIC ACID-NACL 1000-0.7 MG/100ML-% IV SOLN
1000.0000 mg | Freq: Once | INTRAVENOUS | Status: AC
  Administered 2019-01-08: 1000 mg via INTRAVENOUS
  Filled 2019-01-08: qty 100

## 2019-01-08 MED ORDER — CEPHALEXIN 500 MG PO CAPS
500.0000 mg | ORAL_CAPSULE | Freq: Once | ORAL | Status: AC
  Administered 2019-01-08: 500 mg via ORAL
  Filled 2019-01-08: qty 1

## 2019-01-08 MED ORDER — PANTOPRAZOLE SODIUM 40 MG PO TBEC: 40.0000 mg | DELAYED_RELEASE_TABLET | Freq: Every day | ORAL | Status: DC | PRN

## 2019-01-08 MED ORDER — TRANEXAMIC ACID 1000 MG/10ML IV SOLN
1000.0000 mg | Freq: Once | INTRAVENOUS | Status: DC
  Filled 2019-01-08: qty 10

## 2019-01-08 MED ORDER — HYDROMORPHONE HCL 1 MG/ML IJ SOLN
INTRAMUSCULAR | Status: AC
  Filled 2019-01-08: qty 1

## 2019-01-08 MED ORDER — ACETAMINOPHEN 500 MG PO TABS: 500.0000 mg | ORAL_TABLET | Freq: Four times a day (QID) | ORAL | Status: DC | PRN

## 2019-01-08 MED ORDER — LABETALOL HCL 5 MG/ML IV SOLN: 10.0000 mg | INTRAVENOUS | Status: DC | PRN

## 2019-01-08 MED ORDER — HYDROCODONE-ACETAMINOPHEN 5-325 MG PO TABS: 1.0000 | ORAL_TABLET | ORAL | Status: DC | PRN

## 2019-01-08 MED ORDER — OMEPRAZOLE MAGNESIUM 20 MG PO TBEC: 20.0000 mg | DELAYED_RELEASE_TABLET | Freq: Every day | ORAL | Status: DC | PRN

## 2019-01-08 MED ORDER — OXYMETAZOLINE HCL 0.05 % NA SOLN
1.0000 | Freq: Once | NASAL | Status: AC
  Administered 2019-01-08: 1 via NASAL

## 2019-01-08 MED ORDER — MAGNESIUM OXIDE 400 (241.3 MG) MG PO TABS
400.0000 mg | ORAL_TABLET | ORAL | Status: DC
  Administered 2019-01-09: 400 mg via ORAL
  Filled 2019-01-08: qty 1

## 2019-01-08 MED ORDER — LABETALOL HCL 5 MG/ML IV SOLN
10.0000 mg | Freq: Once | INTRAVENOUS | Status: AC
  Administered 2019-01-08: 10 mg via INTRAVENOUS
  Filled 2019-01-08: qty 4

## 2019-01-08 MED ORDER — DIAZEPAM 2 MG PO TABS: 2.0000 mg | ORAL_TABLET | Freq: Four times a day (QID) | ORAL | Status: DC | PRN

## 2019-01-08 MED ORDER — TRANEXAMIC ACID 1000 MG/10ML IV SOLN
500.0000 mg | Freq: Once | INTRAVENOUS | Status: DC
  Filled 2019-01-08: qty 10

## 2019-01-08 MED ORDER — SODIUM CHLORIDE 0.9 % IV BOLUS
1000.0000 mL | Freq: Once | INTRAVENOUS | Status: AC
  Administered 2019-01-08: 1000 mL via INTRAVENOUS

## 2019-01-08 MED ORDER — ONDANSETRON HCL 4 MG/2ML IJ SOLN
4.0000 mg | INTRAMUSCULAR | Status: DC | PRN
  Administered 2019-01-08: 4 mg via INTRAVENOUS
  Filled 2019-01-08: qty 2

## 2019-01-08 MED ORDER — OXYMETAZOLINE HCL 0.05 % NA SOLN
2.0000 | Freq: Once | NASAL | Status: AC
  Administered 2019-01-08: 2 via NASAL

## 2019-01-08 MED ORDER — SODIUM CHLORIDE 0.9 % IV SOLN
INTRAVENOUS | Status: DC
  Administered 2019-01-08 – 2019-01-09 (×2): via INTRAVENOUS

## 2019-01-08 MED ORDER — LIDOCAINE-PRILOCAINE 2.5-2.5 % EX CREA
1.0000 "application " | TOPICAL_CREAM | CUTANEOUS | Status: DC | PRN
  Administered 2019-01-08: 1 via TOPICAL
  Filled 2019-01-08: qty 5

## 2019-01-08 NOTE — ED Notes (Addendum)
Pt with continued R sided nosebleed that started again while in the lobby. 2 sprays Afrin to each nare per MD order administered by this RN. Pt remains A&O x4. Pt noted to be dripping blood from R nare when clamp removed to administer Afrin. Nasal clamp re-aplied after administration of Afrin.   EDP made aware.

## 2019-01-08 NOTE — ED Notes (Signed)
Pt given cup of ice water with straw to try and drink. Okay per MD.

## 2019-01-08 NOTE — Discharge Instructions (Addendum)
Your lab tests and CT scan of the head were okay today.  Please follow-up with your doctor in a few days for continued monitoring of your symptoms.  Return to the emergency department if your symptoms become worse or if you have new concerns.

## 2019-01-08 NOTE — ED Notes (Signed)
ED TO INPATIENT HANDOFF REPORT  ED Nurse Name and Phone #:  Laquashia Mergenthaler [phone]  S Name/Age/Gender Denise [name] 43 y.o. female Room/Bed: ED14A/ED14A  Code Status   Code Status: Full Code  Home/SNF/Other Home Patient oriented to: self, place, time and situation Is this baseline? Yes   Triage Complete: Triage complete  Chief Complaint [facility] - Epistaxis  Triage Note Pt to ED via EMS from home. Pt woke up from sleep with nose bleed both sides. Pt arrives with nose still bleeding, pt states it was so bad at one point the blood was coming from her tearduct's. Pt had recent bilateral mastectomy this yr. VSS. NAD.   Allergies Allergies  Allergen Reactions  . Steri-Strip Compound Benzoin [Benzoin Compound]     Steri-strip ,   . Tape Other (See Comments)    Minor skin irritation     Level of Care/Admitting Diagnosis ED Disposition    ED Disposition Condition Comment   Admit  Hospital Area: [facility] [[id number]]  Level of Care: Med-Surg [16]  Covid Evaluation: Person Under Investigation (PUI)  Diagnosis: Epistaxis Marvelous.Norman.7.ICD-9-CM]  Admitting Physician: Rufina Falco [place]  Attending Physician: Rufina Falco ACHIENG [phone]  PT Class (Do Not Modify): Observation [104]  PT Acc Code (Do Not Modify): Observation [10022]       B Medical/Surgery History Past Medical History:  Diagnosis Date  . Abnormal breast biopsy    [facility], [name]  . Breast cancer ([place]) 11/2017   left breast; ER/PR/Her2neu POS  . Family history of breast cancer   . Family history of ovarian cancer   . Fibroadenoma of breast, left 2000, 2016  . Genetic screening 08/26/2013   BRCA/BART negative/[facility], CHEK2 POS 2017  . Increased risk of breast cancer 2017   IBIS=47%  . Left breast lump 06/18/2017   Fluid/drained [name]  . Migraine   . Monoallelic mutation of CHEK2 gene in female patient 2017   increased risk of breast and colon cancer  . Personal history  of chemotherapy   . Personal history of radiation therapy    Past Surgical History:  Procedure Laterality Date  . BREAST BIOPSY Bilateral 09/08/14   neg  . BREAST BIOPSY Left 03/16/2015   Procedure: BREAST BIOPSY WITH NEEDLE LOCALIZATION;  Surgeon: [name], MD;  Location: [facility];  Service: General;  Laterality: Left;  . BREAST BIOPSY Left 11/2017   positive  . BREAST CYST ASPIRATION Left 06/18/2017   cyst aspiration  . BREAST EXCISIONAL BIOPSY Left 09/2014  . BREAST EXCISIONAL BIOPSY Right    age 76's  . BREAST RECONSTRUCTION WITH PLACEMENT OF TISSUE EXPANDER AND FLEX HD (ACELLULAR HYDRATED DERMIS) Left 05/20/2018   Procedure: BREAST RECONSTRUCTION WITH PLACEMENT OF TISSUE EXPANDER AND FLEX HD (ACELLULAR HYDRATED DERMIS);  Surgeon: [name], DO;  Location: [facility];  Service: Plastics;  Laterality: Left;  . BREAST RECONSTRUCTION WITH PLACEMENT OF TISSUE EXPANDER AND FLEX HD (ACELLULAR HYDRATED DERMIS) Right 12/09/2018   Procedure: BREAST RECONSTRUCTION WITH PLACEMENT OF TISSUE EXPANDER AND FLEX HD (ACELLULAR HYDRATED DERMIS);  Surgeon: [name], DO;  Location: [facility];  Service: Plastics;  Laterality: Right;  . BREAST SURGERY Right March 2000   benign fibroadenoma  . BREAST SURGERY Left 08/08/13   excision  . BREAST SURGERY Left 09/23/14   excision  . MASTECTOMY Left 05/20/2018  . MASTECTOMY W/ SENTINEL NODE BIOPSY Left 05/20/2018   Procedure: MASTECTOMY WITH SENTINEL LYMPH NODE BIOPSY;  Surgeon: [name],  MD;  Location: [facility];  Service: General;  Laterality: Left;  . PORTACATH PLACEMENT Right 12/17/2017   Procedure: INSERTION PORT-A-CATH;  Surgeon: [name], MD;  Location: [facility];  Service: General;  Laterality: Right;  . SIMPLE MASTECTOMY WITH AXILLARY SENTINEL NODE BIOPSY Right 12/09/2018   Procedure: SIMPLE MASTECTOMY RIGHT;  Surgeon: [name], MD;  Location: [facility];  Service: General;  Laterality: Right;     A IV  Location/Drains/Wounds Patient Lines/Drains/Airways Status   Active Line/Drains/Airways    Name:   Placement date:   Placement time:   Site:   Days:   Implanted Port 12/17/17 Right Chest   12/17/17    0755    Chest   387   Peripheral IV 01/08/19 Right Antecubital   01/08/19    1000    Antecubital   less than 1   Closed System Drain 1 Left Chest   05/20/18    1255    Chest   233   Closed System Drain 1 Right Breast Bulb (JP) 15 Fr.   12/09/18    1100    Breast   30   Incision (Closed) 03/16/15 Breast Left   03/16/15    1227     1394   Incision (Closed) 12/17/17 Chest Right   12/17/17    0758     387   Incision (Closed) 05/20/18 Breast Left   05/20/18    1307     233   Incision (Closed) 12/09/18 Breast Right   12/09/18    1126     30   Incision - 1 Port Chest    05/20/18    1045     233          Intake/Output Last 24 hours  Intake/Output Summary (Last 24 hours) at 01/08/2019 1355 Last data filed at 01/08/2019 1038 Gross per 24 hour  Intake -  Output 400 ml  Net -400 ml    Labs/Imaging Results for orders placed or performed during the hospital encounter of 01/08/19 (from the past 48 hour(s))  CBC     Status: None   Collection Time: 01/08/19  6:24 AM  Result Value Ref Range   WBC 5.9 4.0 - 10.5 K/uL   RBC 4.52 3.87 - 5.11 MIL/uL   Hemoglobin 12.7 12.0 - 15.0 g/dL   HCT 38.6 36.0 - 46.0 %   MCV 85.4 80.0 - 100.0 fL   MCH 28.1 26.0 - 34.0 pg   MCHC 32.9 30.0 - 36.0 g/dL   RDW 14.4 11.5 - 15.5 %   Platelets 217 150 - 400 K/uL   nRBC 0.0 0.0 - 0.2 %    Comment: Performed at [facility], [address] [place], [place] [address]  Comprehensive metabolic panel     Status: Abnormal   Collection Time: 01/08/19  6:24 AM  Result Value Ref Range   Sodium 141 135 - 145 mmol/L   Potassium 4.1 3.5 - 5.1 mmol/L   Chloride 105 98 - 111 mmol/L   CO2 26 22 - 32 mmol/L   Glucose, Bld 129 (H) 70 - 99 mg/dL   BUN 14 6 - 20 mg/dL   Creatinine, Ser 0.64 0.44 - 1.00 mg/dL    Calcium 9.3 8.9 - 10.3 mg/dL   Total Protein 7.9 6.5 - 8.1 g/dL   Albumin 4.1 3.5 - 5.0 g/dL   AST 28 15 - 41 U/L   ALT 23 0 - 44 U/L   Alkaline  Phosphatase 59 38 - 126 U/L   Total Bilirubin 0.7 0.3 - 1.2 mg/dL   GFR calc non Af Amer >60 >60 mL/min   GFR calc Af Amer >60 >60 mL/min   Anion gap 10 5 - 15    Comment: Performed at [facility], [address] [place], [place] [address]  Protime-INR     Status: None   Collection Time: 01/08/19  6:24 AM  Result Value Ref Range   Prothrombin Time 12.2 11.4 - 15.2 seconds   INR 0.9 0.8 - 1.2    Comment: (NOTE) INR goal varies based on device and disease states. Performed at [facility], [address] [place], [place] [address]   APTT     Status: None   Collection Time: 01/08/19  6:24 AM  Result Value Ref Range   aPTT 25 24 - 36 seconds    Comment: Performed at [facility], [address] [place], [place] [address]  SARS Coronavirus 2 (CEPHEID - Performed in [place] hospital lab), Hosp Order     Status: None   Collection Time: 01/08/19 11:44 AM   Specimen: Oropharyngeal swab; Nasopharyngeal  Result Value Ref Range   SARS Coronavirus 2 NEGATIVE NEGATIVE    Comment: (NOTE) If result is NEGATIVE SARS-CoV-2 target nucleic acids are NOT DETECTED. The SARS-CoV-2 RNA is generally detectable in upper and lower  respiratory specimens during the acute phase of infection. The lowest  concentration of SARS-CoV-2 viral copies this assay can detect is 250  copies / mL. A negative result does not preclude SARS-CoV-2 infection  and should not be used as the sole basis for treatment or other  patient management decisions.  A negative result may occur with  improper specimen collection / handling, submission of specimen other  than nasopharyngeal swab, presence of viral mutation(s) within the  areas targeted by this assay, and inadequate number of viral copies  (<250 copies / mL). A negative result must be  combined with clinical  observations, patient history, and epidemiological information. If result is POSITIVE SARS-CoV-2 target nucleic acids are DETECTED. The SARS-CoV-2 RNA is generally detectable in upper and lower  respiratory specimens dur ing the acute phase of infection.  Positive  results are indicative of active infection with SARS-CoV-2.  Clinical  correlation with patient history and other diagnostic information is  necessary to determine patient infection status.  Positive results do  not rule out bacterial infection or co-infection with other viruses. If result is PRESUMPTIVE POSTIVE SARS-CoV-2 nucleic acids MAY BE PRESENT.   A presumptive positive result was obtained on the submitted specimen  and confirmed on repeat testing.  While 2019 novel coronavirus  (SARS-CoV-2) nucleic acids may be present in the submitted sample  additional confirmatory testing may be necessary for epidemiological  and / or clinical management purposes  to differentiate between  SARS-CoV-2 and other Sarbecovirus currently known to infect humans.  If clinically indicated additional testing with an alternate test  methodology [phone]) is advised. The SARS-CoV-2 RNA is generally  detectable in upper and lower respiratory sp ecimens during the acute  phase of infection. The expected result is Negative. Fact Sheet for Patients:  StrictlyIdeas.no Fact Sheet for Healthcare Providers: BankingDealers.co.za This test is not yet approved or cleared by the [place] FDA and has been authorized for detection and/or diagnosis of SARS-CoV-2 by FDA under an Emergency Use Authorization (EUA).  This EUA will remain in effect (meaning this test can be used) for the  duration of the COVID-19 declaration under Section 564(b)(1) of the Act, 21 U.S.C. section 360bbb-3(b)(1), unless the authorization is terminated or revoked sooner. Performed at [facility], [place], [place] [address]    Ct Head Wo Contrast  Result Date: 01/08/2019 CLINICAL DATA:  The patient woke up with a severe nose bleed last night. EXAM: CT HEAD WITHOUT CONTRAST TECHNIQUE: Contiguous axial images were obtained from the base of the skull through the vertex without intravenous contrast. COMPARISON:  None. FINDINGS: Brain: No evidence of acute infarction, hemorrhage, hydrocephalus, extra-axial collection or mass lesion/mass effect. Vascular: No hyperdense vessel or unexpected calcification. Skull: Intact.  No focal lesion. Sinuses/Orbits: Minimal mucosal thickening right sphenoid sinus noted. Other: None. IMPRESSION: Minimal mucosal thickening right sphenoid sinus. The examination is otherwise normal. Electronically Signed   By: Inge Rise M.D.   On: 01/08/2019 07:47    Pending Labs Unresulted Labs (From admission, onward)   None      Vitals/Pain Today's Vitals   01/08/19 1230 01/08/19 1302 01/08/19 1330 01/08/19 1335  BP: 92/79 92/62 96/72    Pulse: 98 91 100   Resp: 14 14 15    Temp:      TempSrc:      SpO2: 96% 95% 97%   Weight:      Height:      PainSc:    Asleep    Isolation Precautions No active isolations  Medications Medications  acetaminophen (TYLENOL) tablet 500 mg (has no administration in time range)  HYDROcodone-acetaminophen (NORCO/VICODIN) 5-325 MG per tablet 1 tablet (has no administration in time range)  tamoxifen (NOLVADEX) tablet 20 mg (has no administration in time range)  diazepam (VALIUM) tablet 2 mg (has no administration in time range)  magnesium oxide (MAG-OX) tablet 400 mg (has no administration in time range)  lidocaine-prilocaine (EMLA) cream 1 application (has no administration in time range)  0.9 %  sodium chloride infusion (has no administration in time range)  pantoprazole (PROTONIX) EC tablet 40 mg (has no administration in time range)  oxymetazoline (AFRIN) 0.05 % nasal spray 1 spray (1 spray Each  Nare Given by Other 01/08/19 0349)  oxymetazoline (AFRIN) 0.05 % nasal spray 2 spray (2 sprays Each Nare Given 01/08/19 0826)  HYDROmorphone (DILAUDID) injection 1 mg (1 mg Intramuscular Given 01/08/19 0934)  sodium chloride 0.9 % bolus 1,000 mL (0 mLs Intravenous Stopped 01/08/19 1156)  tranexamic acid (CYKLOKAPRON) IVPB 1,000 mg (0 mg Intravenous Stopped 01/08/19 1100)  LORazepam (ATIVAN) injection 2 mg (2 mg Intravenous Given 01/08/19 1034)  labetalol (NORMODYNE) injection 10 mg (10 mg Intravenous Given 01/08/19 1045)  cephALEXin (KEFLEX) capsule 500 mg (500 mg Oral Given 01/08/19 1155)    Mobility walks High fall risk   Focused Assessments See Focused assessments   R Recommendations: See Admitting Provider Note  Report given to:   Additional Notes:  merocel to R nare, packed by Dr. Richardson Landry from ENT.

## 2019-01-08 NOTE — Consult Note (Signed)
Braden, Deloach [id number] [birth year] Riley Nearing, MD  Reason for Consult: epistaxis Requesting Physician: Gregor Hams, MD Consulting Physician: Riley Nearing  HPI: This 43 y.o. year old female was admitted on 01/08/2019 for [facility] - Epistaxis. BP was significantly elevated on presentation. She was packed with a short Merocel which did not help, then a Rhinorocket, which kept shifting position with bleeding around the pack. BP remained elevated in the 130's/90's range. She was given more labetalol, pain control and Ativan prior to my arrival and the bleeding has actually stopped. No prior h/o epistaxis.  Allergies:  Allergies  Allergen Reactions  . Steri-Strip Compound Benzoin [Benzoin Compound]     Steri-strip ,   . Tape Other (See Comments)    Minor skin irritation     Medications: (Not in a hospital admission) .  No current facility-administered medications for this encounter.    Current Outpatient Medications  Medication Sig Dispense Refill  . acetaminophen (TYLENOL) 500 MG tablet Take 500 mg by mouth every 6 (six) hours as needed for moderate pain or headache.    . diazepam (VALIUM) 2 MG tablet Take 1 tablet (2 mg total) by mouth every 6 (six) hours as needed for anxiety. 30 tablet 0  . HYDROcodone-acetaminophen (NORCO) 5-325 MG tablet Take 1 tablet by mouth every 8 (eight) hours as needed for moderate pain. (Patient taking differently: Take 1 tablet by mouth every 4 (four) hours as needed for moderate pain. ) 20 tablet 0  . lidocaine-prilocaine (EMLA) cream Apply 1 application topically as needed (port access).    . magnesium oxide (MAG-OX) 400 MG tablet Take 1 tablet (400 mg total) by mouth daily. (Patient taking differently: Take 400 mg by mouth 2 (two) times a week. ) 30 tablet 0  . omeprazole (PRILOSEC OTC) 20 MG tablet Take 20 mg by mouth daily as needed (for acid reflux).    . tamoxifen (NOLVADEX) 20 MG tablet Take 1 tablet (20 mg total) by mouth daily. 30 tablet  3  . cephALEXin (KEFLEX) 500 MG capsule Take 1 capsule (500 mg total) by mouth every 6 (six) hours. (Patient not taking: Reported on 12/19/2018) 30 capsule 0   Facility-Administered Medications Ordered in Other Encounters  Medication Dose Route Frequency Provider Last Rate Last Dose  . sodium chloride flush (NS) 0.9 % injection 10 mL  10 mL Intravenous PRN Lequita Asal, MD   10 mL at 04/17/18 0844    PMH:  Past Medical History:  Diagnosis Date  . Abnormal breast biopsy    [facility], [name]  . Breast cancer ([facility]) 11/2017   left breast; ER/PR/Her2neu POS  . Family history of breast cancer   . Family history of ovarian cancer   . Fibroadenoma of breast, left 2000, 2016  . Genetic screening 08/26/2013   BRCA/BART negative/[facility], CHEK2 POS 2017  . Increased risk of breast cancer 2017   IBIS=47%  . Left breast lump 06/18/2017   Fluid/drained [name]  . Migraine   . Monoallelic mutation of CHEK2 gene in female patient 2017   increased risk of breast and colon cancer  . Personal history of chemotherapy   . Personal history of radiation therapy     Fam Hx:  Family History  Problem Relation Age of Onset  . Prostate cancer Father 67       requiring tx/hormones  . Breast cancer Paternal Grandmother 57  . Breast cancer Maternal Aunt 60  . Ovarian cancer Maternal Aunt 70  .  Breast cancer Paternal Aunt 8  . Pancreatic cancer Maternal Aunt 75    Soc Hx:  Social History   Socioeconomic History  . Marital status: Married    Spouse name: Not on file  . Number of children: Not on file  . Years of education: Not on file  . Highest education level: Not on file  Occupational History  . Not on file  Social Needs  . Financial resource strain: Not on file  . Food insecurity    Worry: Not on file    Inability: Not on file  . Transportation needs    Medical: Not on file    Non-medical: Not on file  Tobacco Use  . Smoking status: Never Smoker  . Smokeless tobacco: Never  Used  Substance and Sexual Activity  . Alcohol use: Yes    Comment: occasionally  . Drug use: No  . Sexual activity: Yes    Birth control/protection: Condom  Lifestyle  . Physical activity    Days per week: 3 days    Minutes per session: 60 min  . Stress: Only a little  Relationships  . Social Herbalist on phone: Twice a week    Gets together: Once a week    Attends religious service: More than 4 times per year    Active member of club or organization: No    Attends meetings of clubs or organizations: Never    Relationship status: Married  . Intimate partner violence    Fear of current or ex partner: No    Emotionally abused: No    Physically abused: No    Forced sexual activity: No  Other Topics Concern  . Not on file  Social History Narrative  . Not on file    PSH:  Past Surgical History:  Procedure Laterality Date  . BREAST BIOPSY Bilateral 09/08/14   neg  . BREAST BIOPSY Left 03/16/2015   Procedure: BREAST BIOPSY WITH NEEDLE LOCALIZATION;  Surgeon: [name], MD;  Location: [facility];  Service: General;  Laterality: Left;  . BREAST BIOPSY Left 11/2017   positive  . BREAST CYST ASPIRATION Left 06/18/2017   cyst aspiration  . BREAST EXCISIONAL BIOPSY Left 09/2014  . BREAST EXCISIONAL BIOPSY Right    age 68's  . BREAST RECONSTRUCTION WITH PLACEMENT OF TISSUE EXPANDER AND FLEX HD (ACELLULAR HYDRATED DERMIS) Left 05/20/2018   Procedure: BREAST RECONSTRUCTION WITH PLACEMENT OF TISSUE EXPANDER AND FLEX HD (ACELLULAR HYDRATED DERMIS);  Surgeon: [name], DO;  Location: [facility];  Service: Plastics;  Laterality: Left;  . BREAST RECONSTRUCTION WITH PLACEMENT OF TISSUE EXPANDER AND FLEX HD (ACELLULAR HYDRATED DERMIS) Right 12/09/2018   Procedure: BREAST RECONSTRUCTION WITH PLACEMENT OF TISSUE EXPANDER AND FLEX HD (ACELLULAR HYDRATED DERMIS);  Surgeon: [name], DO;  Location: [facility];  Service: Plastics;  Laterality: Right;  . BREAST  SURGERY Right March 2000   benign fibroadenoma  . BREAST SURGERY Left 08/08/13   excision  . BREAST SURGERY Left 09/23/14   excision  . MASTECTOMY Left 05/20/2018  . MASTECTOMY W/ SENTINEL NODE BIOPSY Left 05/20/2018   Procedure: MASTECTOMY WITH SENTINEL LYMPH NODE BIOPSY;  Surgeon: [name], MD;  Location: [facility];  Service: General;  Laterality: Left;  . PORTACATH PLACEMENT Right 12/17/2017   Procedure: INSERTION PORT-A-CATH;  Surgeon: [name], MD;  Location: [facility];  Service: General;  Laterality: Right;  . SIMPLE MASTECTOMY WITH AXILLARY SENTINEL NODE BIOPSY Right 12/09/2018  Procedure: SIMPLE MASTECTOMY RIGHT;  Surgeon: [name], MD;  Location: [facility];  Service: General;  Laterality: Right;  . Procedures since admission: No admission procedures for hospital encounter.  ROS: Review of systems normal other than 12 systems except per HPI.  PHYSICAL EXAM Vitals:  Vitals:   01/08/19 1042 01/08/19 1051  BP: (!) 136/104 (!) 117/94  Pulse: (!) 140 (!) 104  Resp: 13 (!) 9  Temp:    SpO2: 96% 94%  . General: Well-developed, Well-nourished in no acute distress, sedated Mood: Mood and affect well adjusted, pleasant and cooperative. Orientation: Sedated but arousable. Vocal Quality: No hoarseness. Communicates verbally. head and Face: NCAT. No facial asymmetry. No visible skin lesions. No significant facial scars. No tenderness with sinus percussion. Facial strength normal and symmetric. Ears: External ears with normal landmarks, no lesions. External auditory canals free of infection, cerumen impaction or lesions. Tympanic membranes intact with mild hemotympanum AU Hearing: Speech reception grossly normal. Nose: External nose normal with midline dorsum and no lesions or deformity. Nasal Cavity reveals rhinorocket pack on right, partially extruded. No active bleeding at the moment. No bleeding from left nasal cavity Oral Cavity/ Oropharynx: Lips are normal with  no lesions. Teeth no frank dental caries. Gingiva healthy with no lesions or gingivitis. Oropharynx including tongue, buccal mucosa, floor of mouth, hard and soft palate, uvula and posterior pharynx free of exudates, erythema or lesions with normal symmetry and hydration. Minimal clot posterior pharynx with no active bleeding Indirect Laryngoscopy/Nasopharyngoscopy: Visualization of the larynx, hypopharynx and nasopharynx is not possible in this setting with routine examination. Neck: Supple and symmetric with no palpable masses, tenderness or crepitance. The trachea is midline. Thyroid gland is soft, nontender and symmetric with no masses or enlargement. Parotid and submandibular glands are soft, nontender and symmetric, without masses. Lymphatic: Cervical lymph nodes are without palpable lymphadenopathy or tenderness. Respiratory: Normal respiratory effort without labored breathing. Cardiovascular: Carotid pulse shows regular rate and rhythm Neurologic: Cranial Nerves II through XII are grossly intact. Eyes: Gaze and Ocular Motility are grossly normal. PERRLA. No visible nystagmus.  MEDICAL DECISION MAKING: Data Review:  Results for orders placed or performed during the hospital encounter of 01/08/19 (from the past 48 hour(s))  CBC     Status: None   Collection Time: 01/08/19  6:24 AM  Result Value Ref Range   WBC 5.9 4.0 - 10.5 K/uL   RBC 4.52 3.87 - 5.11 MIL/uL   Hemoglobin 12.7 12.0 - 15.0 g/dL   HCT 38.6 36.0 - 46.0 %   MCV 85.4 80.0 - 100.0 fL   MCH 28.1 26.0 - 34.0 pg   MCHC 32.9 30.0 - 36.0 g/dL   RDW 14.4 11.5 - 15.5 %   Platelets 217 150 - 400 K/uL   nRBC 0.0 0.0 - 0.2 %    Comment: Performed at [facility], [address] [place],  [address]  Comprehensive metabolic panel     Status: Abnormal   Collection Time: 01/08/19  6:24 AM  Result Value Ref Range   Sodium 141 135 - 145 mmol/L   Potassium 4.1 3.5 - 5.1 mmol/L   Chloride 105 98 - 111 mmol/L   CO2 26  22 - 32 mmol/L   Glucose, Bld 129 (H) 70 - 99 mg/dL   BUN 14 6 - 20 mg/dL   Creatinine, Ser 0.64 0.44 - 1.00 mg/dL   Calcium 9.3 8.9 - 10.3 mg/dL   Total Protein 7.9 6.5 - 8.1 g/dL  Albumin 4.1 3.5 - 5.0 g/dL   AST 28 15 - 41 U/L   ALT 23 0 - 44 U/L   Alkaline Phosphatase 59 38 - 126 U/L   Total Bilirubin 0.7 0.3 - 1.2 mg/dL   GFR calc non Af Amer >60 >60 mL/min   GFR calc Af Amer >60 >60 mL/min   Anion gap 10 5 - 15    Comment: Performed at [facility], [address] [place], [place] [address]  Protime-INR     Status: None   Collection Time: 01/08/19  6:24 AM  Result Value Ref Range   Prothrombin Time 12.2 11.4 - 15.2 seconds   INR 0.9 0.8 - 1.2    Comment: (NOTE) INR goal varies based on device and disease states. Performed at [facility], [address] [place], [place] [address]   APTT     Status: None   Collection Time: 01/08/19  6:24 AM  Result Value Ref Range   aPTT 25 24 - 36 seconds    Comment: Performed at [facility], [address] [place], [place] [address]  . Ct Head Wo Contrast  Result Date: 01/08/2019 CLINICAL DATA:  The patient woke up with a severe nose bleed last night. EXAM: CT HEAD WITHOUT CONTRAST TECHNIQUE: Contiguous axial images were obtained from the base of the skull through the vertex without intravenous contrast. COMPARISON:  None. FINDINGS: Brain: No evidence of acute infarction, hemorrhage, hydrocephalus, extra-axial collection or mass lesion/mass effect. Vascular: No hyperdense vessel or unexpected calcification. Skull: Intact.  No focal lesion. Sinuses/Orbits: Minimal mucosal thickening right sphenoid sinus noted. Other: None. IMPRESSION: Minimal mucosal thickening right sphenoid sinus. The examination is otherwise normal. Electronically Signed   By: Inge Rise M.D.   On: 01/08/2019 07:47  .   PROCEDURE: Procedure: Posterior packing for control of epistaxis Diagnosis: Epistaxis Indications: Persistent  nosebleed Findings:Bleeding from posterior nasal cavity, right side. Unable to visualize source Description of Procedure: After discussing procedure and risks  (primarily nose bleed) with the patient, the prior Rhinorocket pack was deflated and removed, and the nose was anesthetized with topical Lidocaine 4% and decongested with phenylephrine. Blood clot was then suctioned from the right nasal cavity. Mild to moderate bleeding was noted with removal of the pack, and appeared to be coming from the posterior nasal cavity. There was no bleeding from the nasal septum or anterior turbinate region. Afrin was sprayed in the nose and a 10cm Merocel pack placed, controlling the bleeding. No bleeding was seen in the posterior pharynx after completion of the procedure. The patient tolerated the procedure well.  ASSESSMENT: Epistaxis, posterior, controlled with Merocel pack and BP reduction/ anxiety control  PLAN: Patient can follow up with me on Monday for packing removal. She should rest, avoid strenuous activity and NSAIDs, and also consult with her primary care physician on management of her blood pressure, as this was most certainly the initial cause of the onset of bleeding. Anxiety likely contributed to bleeding persisting. She should be covered with antibiotics while packed, such as doxycycline 162m PO BID x 10 days or possibly Keflex.    PRiley Nearing MD 01/08/2019 11:23 AM

## 2019-01-08 NOTE — ED Notes (Signed)
This RN to bedside at this time, pt awakens with mild stimuli, notified patient would be calling report and she would be going upstairs to inpatient bed. Pt states understanding at this time. Asking for nausea medication, will page admitting for orders.

## 2019-01-08 NOTE — ED Notes (Signed)
This RN notified by 1st nurse that patient was in lobby and began having nosebleed again. Pt brought back to room 14. Dr. Joni Fears aware.

## 2019-01-08 NOTE — ED Notes (Signed)
Pt with 100cc's noted in emesis bag at this time.

## 2019-01-08 NOTE — ED Notes (Signed)
Pt repositioned in bed at this time for comfort. Pt sipping water at this time, requesting this RN call her husband.

## 2019-01-08 NOTE — ED Notes (Signed)
Pt given warm blankets per her request. Pt awakens easily at this time, noted to be relaxed in the bed. Will continue to monitor for further patient needs.

## 2019-01-08 NOTE — ED Notes (Signed)
This RN called to bedside at this time, pt noted to be profusely bleeding around the rhinnorocket and from her mouth. Pt noted to have approx 300cc in her emesis bag. Pt's HR noted to be 168 at this time. EDP at bedside at this time. Suction set up by this RN, IV restarted. VORB for 1L NS bolus, initiated by this RN at this time.

## 2019-01-08 NOTE — ED Notes (Signed)
This RN to bedside, introduced self to patient. This RN apologized for delay in coming to room. Pt states understanding. Explained CT resulted, would speak with MD to come and discuss results. Pt states understanding. Pt A&O x 4, NAD noted at this time. No active bleeding noted at this time.

## 2019-01-08 NOTE — ED Notes (Signed)
Dr. Virgia Land at bedside, flexible laryngoscope, merocel and Afrin at bedside for ENT use.

## 2019-01-08 NOTE — ED Provider Notes (Signed)
[facility] Emergency Department Provider Note   First MD Initiated Contact with Patient 01/08/19 0345     (approximate)  I have reviewed the triage vital signs and the nursing notes.   HISTORY  Chief Complaint Epistaxis    HPI Denise [name] is a 43 y.o. female with below list of previous medical conditions including recent bilateral mastectomy this year presents to the emergency department secondary to acute onset of epistaxis tonight which awoke the patient from sleep.  Patient presents actively bleeding from bilateral nares despite attempting to pinch her nose.        Past Medical History:  Diagnosis Date  . Abnormal breast biopsy    PASH, [name]  . Breast cancer ([place]) 11/2017   left breast; ER/PR/Her2neu POS  . Family history of breast cancer   . Family history of ovarian cancer   . Fibroadenoma of breast, left 2000, 2016  . Genetic screening 08/26/2013   BRCA/BART negative/Myriad, CHEK2 POS 2017  . Increased risk of breast cancer 2017   IBIS=47%  . Left breast lump 06/18/2017   Fluid/drained [name]  . Migraine   . Monoallelic mutation of CHEK2 gene in female patient 2017   increased risk of breast and colon cancer  . Personal history of chemotherapy   . Personal history of radiation therapy     Patient Active Problem List   Diagnosis Date Noted  . Acquired absence of right breast 12/17/2018  . Admission for breast reconstruction following mastectomy 12/09/2018  . Use of tamoxifen (Nolvadex) 12/04/2018  . Acquired absence of left breast 09/03/2018  . Visual changes 08/20/2018  . Breast asymmetry following reconstructive surgery 08/13/2018  . Hot flashes due to menopause 06/18/2018  . Status post left mastectomy 06/04/2018  . S/P breast reconstruction 05/28/2018  . Vasomotor symptoms due to menopause 04/17/2018  . Elevated LFTs 03/17/2018  . Goals of care, counseling/discussion 03/17/2018  . Hypomagnesemia 03/04/2018   . Diarrhea 01/02/2018  . Encounter for antineoplastic chemotherapy 12/21/2017  . Encounter for antineoplastic immunotherapy 12/21/2017  . 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 Breast mass, right 12/05/2016  . Monoallelic mutation of CHEK2 gene in female patient 06/20/2015  . Malignant neoplasm of upper-outer quadrant of female breast ([place]) 09/17/2014    Past Surgical History:  Procedure Laterality Date  . BREAST BIOPSY Bilateral 09/08/14   neg  . BREAST BIOPSY Left 03/16/2015   Procedure: BREAST BIOPSY WITH NEEDLE LOCALIZATION;  Surgeon: [name], MD;  Location: [facility] ORS;  Service: General;  Laterality: Left;  . BREAST BIOPSY Left 11/2017   positive  . BREAST CYST ASPIRATION Left 06/18/2017   cyst aspiration  . BREAST EXCISIONAL BIOPSY Left 09/2014  . BREAST EXCISIONAL BIOPSY Right    age 47's  . BREAST RECONSTRUCTION WITH PLACEMENT OF TISSUE EXPANDER AND FLEX HD (ACELLULAR HYDRATED DERMIS) Left 05/20/2018   Procedure: BREAST RECONSTRUCTION WITH PLACEMENT OF TISSUE EXPANDER AND FLEX HD (ACELLULAR HYDRATED DERMIS);  Surgeon: [name], DO;  Location: [facility] ORS;  Service: Plastics;  Laterality: Left;  . BREAST RECONSTRUCTION WITH PLACEMENT OF TISSUE EXPANDER AND FLEX HD (ACELLULAR HYDRATED DERMIS) Right 12/09/2018   Procedure: BREAST RECONSTRUCTION WITH PLACEMENT OF TISSUE EXPANDER AND FLEX HD (ACELLULAR HYDRATED DERMIS);  Surgeon: [name], DO;  Location: [facility] ORS;  Service: Plastics;  Laterality: Right;  . BREAST SURGERY Right March 2000   benign fibroadenoma  . BREAST SURGERY Left 08/08/13   excision  .  BREAST SURGERY Left 09/23/14   excision  . MASTECTOMY Left 05/20/2018  . MASTECTOMY W/ SENTINEL NODE BIOPSY Left 05/20/2018   Procedure: MASTECTOMY WITH SENTINEL LYMPH NODE BIOPSY;  Surgeon: [name], MD;  Location: [facility] ORS;  Service: General;  Laterality: Left;  . PORTACATH PLACEMENT Right 12/17/2017   Procedure: INSERTION  PORT-A-CATH;  Surgeon: [name], MD;  Location: [facility] ORS;  Service: General;  Laterality: Right;  . SIMPLE MASTECTOMY WITH AXILLARY SENTINEL NODE BIOPSY Right 12/09/2018   Procedure: SIMPLE MASTECTOMY RIGHT;  Surgeon: [name], MD;  Location: [facility] ORS;  Service: General;  Laterality: Right;    Prior to Admission medications   Medication Sig Start Date End Date Taking? Authorizing Provider  acetaminophen (TYLENOL) 500 MG tablet Take 500 mg by mouth every 6 (six) hours as needed for moderate pain or headache.    [provider]  cephALEXin (KEFLEX) 500 MG capsule Take 1 capsule (500 mg total) by mouth every 6 (six) hours. Patient not taking: Reported on 12/19/2018 12/10/18   [name], MD  diazepam (VALIUM) 2 MG tablet Take 1 tablet (2 mg total) by mouth every 6 (six) hours as needed for anxiety. 09/03/18   Dillingham, [name], DO  HYDROcodone-acetaminophen (NORCO) 5-325 MG tablet Take 1 tablet by mouth every 8 (eight) hours as needed for moderate pain. 05/19/18   Dillingham, [name], DO  lidocaine-prilocaine (EMLA) cream Apply 1 application topically as needed (port access).    [provider]  magnesium oxide (MAG-OX) 400 MG tablet Take 1 tablet (400 mg total) by mouth daily. Patient taking differently: Take 400 mg by mouth 2 (two) times a week.  07/11/18   Karen Kitchens, NP  omeprazole (PRILOSEC OTC) 20 MG tablet Take 20 mg by mouth daily as needed (for acid reflux).    [provider]  tamoxifen (NOLVADEX) 20 MG tablet Take 1 tablet (20 mg total) by mouth daily. 01/02/19   Lequita Asal, MD  prochlorperazine (COMPAZINE) 10 MG tablet Take 1 tablet (10 mg total) by mouth every 6 (six) hours as needed (Nausea or vomiting). Patient not taking: Reported on 05/06/2018 12/19/17 05/07/18  Lequita Asal, MD    Allergies Steri-strip compound benzoin [benzoin compound] and Tape  Family History  Problem Relation Age of Onset  . Prostate  cancer Father 23       requiring tx/hormones  . Breast cancer Paternal Grandmother 53  . Breast cancer Maternal Aunt 60  . Ovarian cancer Maternal Aunt 70  . Breast cancer Paternal Aunt 58  . Pancreatic cancer Maternal Aunt 75    Social History Social History   Tobacco Use  . Smoking status: Never Smoker  . Smokeless tobacco: Never Used  Substance Use Topics  . Alcohol use: Yes    Comment: occasionally  . Drug use: No    Review of Systems Constitutional: No fever/chills Eyes: No visual changes. ENT: No sore throat.  Positive for epistaxis Cardiovascular: Denies chest pain. Respiratory: Denies shortness of breath. Gastrointestinal: No abdominal pain.  No nausea, no vomiting.  No diarrhea.  No constipation. Genitourinary: Negative for dysuria. Musculoskeletal: Negative for neck pain.  Negative for back pain. Integumentary: Negative for rash. Neurological: Negative for headaches, focal weakness or numbness.  ____________________________________________   PHYSICAL EXAM:  VITAL SIGNS: ED Triage Vitals  Enc Vitals Group     BP 01/08/19 0342 (!) 145/105     Pulse Rate 01/08/19 0342 (!) 117     Resp  01/08/19 0342 16     Temp 01/08/19 0342 97.9 F (36.6 C)     Temp Source 01/08/19 0342 Oral     SpO2 01/08/19 0342 99 %     Weight 01/08/19 0341 74.8 kg (165 lb)     Height 01/08/19 0341 1.676 m ('5\' 6"'$ )     Head Circumference --      Peak Flow --      Pain Score 01/08/19 0338 0     Pain Loc --      Pain Edu? --      Excl. in [place]? --     Constitutional: Alert and oriented. Well appearing and in no acute distress. Eyes: Conjunctivae are normal.  Head: Atraumatic. Ears:  Healthy appearing ear canals and TMs bilaterally Nose: Epistaxis from the right nare appears to be anterior in origin Mouth/Throat: Mucous membranes are moist.  Oropharynx non-erythematous. Neck: No stridor.   Cardiovascular: Normal rate, regular rhythm. Good peripheral circulation. Grossly normal  heart sounds. Respiratory: Normal respiratory effort.  No retractions. No audible wheezing. Neurologic:  Normal speech and language. No gross focal neurologic deficits are appreciated.  Skin:  Skin is warm, dry and intact. No rash noted. Psychiatric: Mood and affect are normal. Speech and behavior are normal.  ____________________________________________   LABS (all labs ordered are listed, but only abnormal results are displayed)  Labs Reviewed  COMPREHENSIVE METABOLIC PANEL - Abnormal; Notable for the following components:      Result Value   Glucose, Bld 129 (*)    All other components within normal limits  CBC  PROTIME-INR   ____________________________________________    Procedures   ____________________________________________   INITIAL IMPRESSION / MDM / [place] / ED COURSE  As part of my medical decision making, I reviewed the following data within the electronic MEDICAL RECORD NUMBER   43 year old female presented with above-stated history and physical exam secondary to right epistaxis.  Afrin nasal spray introduced into bilateral naris Merocel introduced into the right nostril.  After 20 minutes patient reevaluated with no active bleeding noted.  Given the fact that the patient's chemotherapeutic agents may result in thrombocytopenia as an adverse reaction CBC obtained which revealed platelet count of 217 which is actually better than the patient's previous platelet count of 208.  Patient reevaluated multiple times with no further bleeding noted.  Patient's care transferred to Dr. Joni Fears ____________________________________________  FINAL CLINICAL IMPRESSION(S) / ED DIAGNOSES  Final diagnoses:  Epistaxis     MEDICATIONS GIVEN DURING THIS VISIT:  Medications  oxymetazoline (AFRIN) 0.05 % nasal spray 1 spray (1 spray Each Nare Given by Other 01/08/19 0349)     ED Discharge Orders    None      *Please note:  DESTENEE GUERRY was evaluated in  Emergency Department on 01/08/2019 for the symptoms described in the history of present illness. She was evaluated in the context of the global COVID-19 pandemic, which necessitated consideration that the patient might be at risk for infection with the SARS-CoV-2 virus that causes COVID-19. Institutional protocols and algorithms that pertain to the evaluation of patients at risk for COVID-19 are in a state of rapid change based on information released by regulatory bodies including the CDC and federal and state organizations. These policies and algorithms were followed during the patient's care in the ED.  Some ED evaluations and interventions may be delayed as a result of limited staffing during the pandemic.*  Note:  This document was prepared using Dragon voice  recognition software and may include unintentional dictation errors.   Gregor Hams, MD 01/08/19 [id number]

## 2019-01-08 NOTE — ED Notes (Signed)
Pt states she feels like nosebleed is getting worse, pt noted to continue to have heavy bleeding from R nare. Pt noted to be spitting into emesis bag at this time with bright red blood noted, pt with heavy bleeding when nose clamp removed. EDP made aware and at bedside at this time.

## 2019-01-08 NOTE — ED Notes (Signed)
NAD noted at time of D/C. Pt denies questions or concerns. Pt ambulatory to the lobby at this time.  

## 2019-01-08 NOTE — H&P (Addendum)
agree with detailed note below. Patient presentation and plan discussed on rounds with APP.  Marland Kitchen      [place] [facility] [place] NAME: Denise [name]    MR#:  [id number]  DATE OF BIRTH:  January 09, 1976  DATE OF ADMISSION:  01/08/2019  PRIMARY CARE PHYSICIAN: [name], PA-C   REQUESTING/REFERRING PHYSICIAN: Brenton Grills, MD  CHIEF COMPLAINT:   Chief Complaint  Patient presents with  . Epistaxis    HISTORY OF PRESENT ILLNESS:  43 y.o. female with past medical history of left breast cancer status post mastectomy and recurrence obstruction on 12/09/2018 on chemo with Kadcyla, last cycle on 12/25/2018 presenting to the ED with complaints of epistaxis.   Patient reports that she woke up from sleep this morning with nose bleed in both nares.Denies history of chronic sinusitis, trauma, septal perforation or deviation, hemophilia, foreign bodies in either naris, recent new medication hypertension, smoking or illegal drug use.  No associated symptoms of headache, dizziness, nausea vomiting.  On arrival to the ED, he was afebrile with blood pressure 145/105 mm Hg and pulse rate 117 beats/min. There were no focal neurological deficits; She was alert and oriented x4 but noted to be actively bleeding from bilateral nares despite attempting to pinch her nose.  Initial labs revealed unremarkable CBC with hemoglobin of 12.7, platelets 217 improved from previous 206.  CMP unremarkable, PT/INR normal.  CT head showed minimal mucosal thickening in the right sphenoid sinus otherwise no acute intracranial abnormality.  ENT was consulted by ED physician patient was packed with a short Merocel with no improvement, then packed with Rhino Rocket.  Due to elevated blood pressure patient was given labetalol, pain control and Ativan with improvement.  She has posterior packing now with controlled epistaxis.  Patient is being admitted under observation for further  monitoring.  PAST MEDICAL HISTORY:   Past Medical History:  Diagnosis Date  . Abnormal breast biopsy    PASH, [name]  . Breast cancer ([employer]) 11/2017   left breast; ER/PR/Her2neu POS  . Family history of breast cancer   . Family history of ovarian cancer   . Fibroadenoma of breast, left 2000, 2016  . Genetic screening 08/26/2013   BRCA/BART negative/Myriad, CHEK2 POS 2017  . Increased risk of breast cancer 2017   IBIS=47%  . Left breast lump 06/18/2017   Fluid/drained [name]  . Migraine   . Monoallelic mutation of CHEK2 gene in female patient 2017   increased risk of breast and colon cancer  . Personal history of chemotherapy   . Personal history of radiation therapy     PAST SURGICAL HISTORY:   Past Surgical History:  Procedure Laterality Date  . BREAST BIOPSY Bilateral 09/08/14   neg  . BREAST BIOPSY Left 03/16/2015   Procedure: BREAST BIOPSY WITH NEEDLE LOCALIZATION;  Surgeon: [name], MD;  Location: [facility] ORS;  Service: General;  Laterality: Left;  . BREAST BIOPSY Left 11/2017   positive  . BREAST CYST ASPIRATION Left 06/18/2017   cyst aspiration  . BREAST EXCISIONAL BIOPSY Left 09/2014  . BREAST EXCISIONAL BIOPSY Right    age 43's  . BREAST RECONSTRUCTION WITH PLACEMENT OF TISSUE EXPANDER AND FLEX HD (ACELLULAR HYDRATED DERMIS) Left 05/20/2018   Procedure: BREAST RECONSTRUCTION WITH PLACEMENT OF TISSUE EXPANDER AND FLEX HD (ACELLULAR HYDRATED DERMIS);  Surgeon: [name], DO;  Location: [facility] ORS;  Service: Plastics;  Laterality: Left;  . BREAST RECONSTRUCTION WITH PLACEMENT OF TISSUE  EXPANDER AND FLEX HD (ACELLULAR HYDRATED DERMIS) Right 12/09/2018   Procedure: BREAST RECONSTRUCTION WITH PLACEMENT OF TISSUE EXPANDER AND FLEX HD (ACELLULAR HYDRATED DERMIS);  Surgeon: [name], DO;  Location: [facility] ORS;  Service: Plastics;  Laterality: Right;  . BREAST SURGERY Right March 2000   benign fibroadenoma  . BREAST SURGERY Left 08/08/13    excision  . BREAST SURGERY Left 09/23/14   excision  . MASTECTOMY Left 05/20/2018  . MASTECTOMY W/ SENTINEL NODE BIOPSY Left 05/20/2018   Procedure: MASTECTOMY WITH SENTINEL LYMPH NODE BIOPSY;  Surgeon: [name], MD;  Location: [facility] OR[facility];  Service: General;  Laterality: Left;  . PORTACATH PLACEMENT Right 12/17/2017   Procedure: INSERTION PORT-A-CATH;  Surgeon: [name], MD;  Location: [facility] ORS;  Service: General;  Laterality: Right;  . SIMPLE MASTECTOMY WITH AXILLARY SENTINEL NODE BIOPSY Right 12/09/2018   Procedure: SIMPLE MASTECTOMY RIGHT;  Surgeon: [name], MD;  Location: [facility] ORS;  Service: General;  Laterality: Right;    SOCIAL HISTORY:   Social History   Tobacco Use  . Smoking status: Never Smoker  . Smokeless tobacco: Never Used  Substance Use Topics  . Alcohol use: Yes    Comment: occasionally    FAMILY HISTORY:   Family History  Problem Relation Age of Onset  . Prostate cancer Father 79       requiring tx/hormones  . Breast cancer Paternal Grandmother 21  . Breast cancer Maternal Aunt 60  . Ovarian cancer Maternal Aunt 70  . Breast cancer Paternal Aunt 71  . Pancreatic cancer Maternal Aunt 75    DRUG ALLERGIES:   Allergies  Allergen Reactions  . Steri-Strip Compound Benzoin [Benzoin Compound]     Steri-strip ,   . Tape Other (See Comments)    Minor skin irritation     REVIEW OF SYSTEMS:   Review of Systems  Constitutional: Negative for chills, fever, malaise/fatigue and weight loss.  HENT: Positive for nosebleeds. Negative for congestion, hearing loss and sore throat.   Eyes: Negative for blurred vision and double vision.  Respiratory: Negative for cough, shortness of breath and wheezing.   Cardiovascular: Negative for chest pain, palpitations, orthopnea and leg swelling.  Gastrointestinal: Negative for abdominal pain, diarrhea, nausea and vomiting.  Genitourinary: Negative for dysuria and urgency.  Musculoskeletal: Negative  for myalgias.  Skin: Negative for rash.  Neurological: Negative for dizziness, sensory change, speech change, focal weakness and headaches.  Psychiatric/Behavioral: Negative for depression.   MEDICATIONS AT HOME:   Prior to Admission medications   Medication Sig Start Date End Date Taking? Authorizing Provider  acetaminophen (TYLENOL) 500 MG tablet Take 500 mg by mouth every 6 (six) hours as needed for moderate pain or headache.   Yes [provider]  diazepam (VALIUM) 2 MG tablet Take 1 tablet (2 mg total) by mouth every 6 (six) hours as needed for anxiety. 09/03/18  Yes Dillingham, [name], DO  HYDROcodone-acetaminophen (NORCO) 5-325 MG tablet Take 1 tablet by mouth every 8 (eight) hours as needed for moderate pain. Patient taking differently: Take 1 tablet by mouth every 4 (four) hours as needed for moderate pain.  05/19/18  Yes Dillingham, [name], DO  lidocaine-prilocaine (EMLA) cream Apply 1 application topically as needed (port access).   Yes [provider]  magnesium oxide (MAG-OX) 400 MG tablet Take 1 tablet (400 mg total) by mouth daily. Patient taking differently: Take 400 mg by mouth 2 (two) times a week.  07/11/18  Yes Honor Loh  E, NP  omeprazole (PRILOSEC OTC) 20 MG tablet Take 20 mg by mouth daily as needed (for acid reflux).   Yes [provider]  tamoxifen (NOLVADEX) 20 MG tablet Take 1 tablet (20 mg total) by mouth daily. 01/02/19  Yes Corcoran, Drue Second, MD  cephALEXin (KEFLEX) 500 MG capsule Take 1 capsule (500 mg total) by mouth every 6 (six) hours. Patient not taking: Reported on 12/19/2018 12/10/18   [name], MD  prochlorperazine (COMPAZINE) 10 MG tablet Take 1 tablet (10 mg total) by mouth every 6 (six) hours as needed (Nausea or vomiting). Patient not taking: Reported on 05/06/2018 12/19/17 05/07/18  Lequita Asal, MD      VITAL SIGNS:  Blood pressure 138/86, pulse (!) 109, temperature 98.9 F (37.2 C), temperature source  Oral, resp. rate 18, height _0  (1.676 m), weight 74.8 kg, last menstrual period 12/05/2018, SpO2 98 %.  PHYSICAL EXAMINATION:   Physical Exam  GENERAL:  43 y.o.-year-old patient lying in the bed with no acute distress.  EYES: Pupils equal, round, reactive to light and accommodation. No scleral icterus. Extraocular muscles intact.  HEENT: Head atraumatic, normocephalic. Oropharynx and nasopharynx clear.  NECK:  Supple, no jugular venous distention. No thyroid enlargement, no tenderness.  LUNGS: Normal breath sounds bilaterally, no wheezing, rales,rhonchi or crepitation. No use of accessory muscles of respiration.  CARDIOVASCULAR: S1, S2 normal. No murmurs, rubs, or gallops.  ABDOMEN: Soft, nontender, nondistended. Bowel sounds present. No organomegaly or mass.  EXTREMITIES: No pedal edema, cyanosis, or clubbing. No rash or lesions. + pedal pulses MUSCULOSKELETAL: Normal bulk, and power was 5+ grip and elbow, knee, and ankle flexion and extension bilaterally.  NEUROLOGIC:Alert and oriented x 3. CN 2-12 intact. Sensation to light touch and cold stimuli intact bilaterally. Finger to nose nl. Babinski is downgoing. DTR's (biceps, patellar, and achilles) 2+ and symmetric throughout. Gait not tested due to safety concern. PSYCHIATRIC: The patient is alert and oriented x 3.  SKIN: No obvious rash, lesion, or ulcer.   DATA REVIEWED:  LABORATORY PANEL:   CBC Recent Labs  Lab 01/08/19 0624 01/08/19 1623  WBC 5.9  --   HGB 12.7 10.4*  HCT 38.6 31.2*  PLT 217  --    ------------------------------------------------------------------------------------------------------------------  Chemistries  Recent Labs  Lab 01/08/19 0624  NA 141  K 4.1  CL 105  CO2 26  GLUCOSE 129*  BUN 14  CREATININE 0.64  CALCIUM 9.3  AST 28  ALT 23  ALKPHOS 59  BILITOT 0.7   ------------------------------------------------------------------------------------------------------------------  Cardiac  Enzymes No results for input(s): TROPONINI in the last 168 hours. ------------------------------------------------------------------------------------------------------------------  RADIOLOGY:  Ct Head Wo Contrast  Result Date: 01/08/2019 CLINICAL DATA:  The patient woke up with a severe nose bleed last night. EXAM: CT HEAD WITHOUT CONTRAST TECHNIQUE: Contiguous axial images were obtained from the base of the skull through the vertex without intravenous contrast. COMPARISON:  None. FINDINGS: Brain: No evidence of acute infarction, hemorrhage, hydrocephalus, extra-axial collection or mass lesion/mass effect. Vascular: No hyperdense vessel or unexpected calcification. Skull: Intact.  No focal lesion. Sinuses/Orbits: Minimal mucosal thickening right sphenoid sinus noted. Other: None. IMPRESSION: Minimal mucosal thickening right sphenoid sinus. The examination is otherwise normal. Electronically Signed   By: Inge Rise M.D.   On: 01/08/2019 07:47    EKG:  EKG: there are no previous tracings available for comparison.  IMPRESSION AND PLAN:   43 y.o. female past medical history of left breast cancer status post mastectomy and recurrence obstruction  on 12/09/2018 on chemo with Kadcyla, last cycle on 12/25/2018 presenting to the ED with complaints of epistaxis.  1.  Posterior epistaxis -unclear etiology - Admit to MedSurg unit for observation - Monitor for bleeding - Monitor H&H - Avoid strenuous activities and NSAID - BP control - Start prophylactic antibiotics while packed with doxycycline 100 mg p.o. twice daily x10 days - ENT follow-up on Monday for packing removal  2. Elevated blood pressure -no history of hypertension - PRN labetalol  3. Anxiety - Valium as needed  4. History of breast cancer -on chemo with Kadcyla last cycle on 12/25/2018 - Follows with oncology Dr. Mike Gip  5. GERD - Protonix   All the records are reviewed and case discussed with ED provider. Management plans  discussed with the patient, family and they are in agreement.  CODE STATUS: FULL  TOTAL TIME TAKING CARE OF THIS PATIENT: 50 minutes.     01/08/2019 at 8:31 PM  Rufina Falco, DNP, FNP-BC [facility] Nurse Practitioner Between 7am to 6pm - Pager [phone]  After 6pm go to [email or web address] - password [email or web address] [facility]  Office  [phone]  CC: Primary care physician; [place], [employer], PA-C

## 2019-01-08 NOTE — ED Triage Notes (Signed)
Pt to ED via EMS from home. Pt woke up from sleep with nose bleed both sides. Pt arrives with nose still bleeding, pt states it was so bad at one point the blood was coming from her tearduct's. Pt had recent bilateral mastectomy this yr. VSS. NAD.

## 2019-01-08 NOTE — Progress Notes (Signed)
Patient's port was accessed by Floor RN prior to return of IV nurse

## 2019-01-08 NOTE — ED Notes (Signed)
EDP at bedside at this time attempting to stop nosebleed. Rhinorocket inserted by MD at this time to attempt to stop bleeding.

## 2019-01-08 NOTE — ED Provider Notes (Addendum)
.  Epistaxis Management  Date/Time: 01/08/2019 12:19 PM Performed by: Carrie Mew, MD Authorized by: Carrie Mew, MD   Consent:    Consent obtained:  Verbal   Consent given by:  Patient   Risks discussed:  Bleeding, infection, pain and nasal injury   Alternatives discussed:  Referral Procedure details:    Treatment site:  R anterior and R posterior   Treatment method:  Nasal balloon   Treatment complexity:  Extensive   Treatment episode: recurring   Post-procedure details:    Assessment:  Bleeding decreased   Patient tolerance of procedure:  Tolerated with difficulty Comments:     Afrin IN Blew out large clots Anterior pack unsuccessful. Posterior pack decreased bleeding but still with substantial bleeding around balloon.   IV TXA 1 gram given. IV ativan, IM dilaudid given separately for anxiolysis and pain control. IV labetalol for bp management due to acute hemorrhage. Consulted ENT         Clinical Course as of Jan 07 1217  Wed Jan 08, 2019  0947 After blowing out a large amount of clots from the right naris, 5.5 cm anterior Rhino Rocket inserted in the right nostril and inflated.  Patient still had blood running down the throat, so this was exchanged for a 7.5 cm anterior/posterior Rhino Rocket and inflated with 20 mL of air.  After holding direct pressure on the exterior nostrils as well, she appears to be hemostatic without any blood in the oropharynx, no external bleeding.  Will observe for hemostasis.  If there is ongoing bleeding she will need further management by ENT.   [PS]  44 Dr. Virgia Land at bedside.   [PS]  4665 Dr.Bennet achieved hemostasis with 10cm merocel pack. Bp, hr now controlled. IVF hydration. Will need to observer with hospitalist for h/h monitoring and hemostasis confirmation. Will start abx ppx.   [PS]    Clinical Course User Index [PS] Carrie Mew, MD    ----------------------------------------- 8:13 AM on  01/08/2019 -----------------------------------------  CT head unremarkable.  Vital signs improved.  Remains hemostatic.  Return precautions and nasal precautions discussed.  She also has a nose clamp and merocel sponge to take home which she feels comfortable using.    Carrie Mew, MD 01/08/19 [phone]   ----------------------------------------- 8:22 AM on 01/08/2019 -----------------------------------------  While ambulating in the waiting room waiting for her ride, patient started having brisk epistaxis again.  She will need to have a nasal tamponade balloon inserted.  I will start her on prophylactic antibiotics as well if she can be discharged.   Carrie Mew, MD 01/08/19 [phone]

## 2019-01-08 NOTE — ED Notes (Signed)
Covid swab collected via oropharyngeal route. Pt tolerated well. Pt given a bag with her personal belongings that her husband brought. Bleeding noted to be under control at this time. Pt resting in bed. Requesting another warm blanket at this time.

## 2019-01-08 NOTE — ED Notes (Signed)
Admitting NP made aware of patient having episode of bloody emesis immediately after administration of IV zofran.

## 2019-01-09 ENCOUNTER — Ambulatory Visit: Payer: BC Managed Care – PPO | Admitting: Physical Therapy

## 2019-01-09 MED ORDER — HYDROCODONE-ACETAMINOPHEN 5-325 MG PO TABS: 1.0000 | ORAL_TABLET | Freq: Three times a day (TID) | ORAL | 0 refills | Status: DC | PRN

## 2019-01-09 MED ORDER — DOXYCYCLINE HYCLATE 100 MG PO TABS: 100.0000 mg | ORAL_TABLET | Freq: Two times a day (BID) | ORAL | 0 refills | Status: DC

## 2019-01-09 MED ORDER — METOPROLOL SUCCINATE ER 25 MG PO TB24
25.0000 mg | ORAL_TABLET | Freq: Every day | ORAL | Status: DC
  Administered 2019-01-09: 10:00:00 25 mg via ORAL
  Filled 2019-01-09: qty 1

## 2019-01-09 MED ORDER — METOPROLOL SUCCINATE ER 25 MG PO TB24: 25.0000 mg | ORAL_TABLET | Freq: Every day | ORAL | 0 refills | Status: DC

## 2019-01-09 MED ORDER — MENTHOL 3 MG MT LOZG
1.0000 | LOZENGE | OROMUCOSAL | Status: DC | PRN
  Administered 2019-01-09 (×2): 3 mg via ORAL
  Filled 2019-01-09: qty 9

## 2019-01-09 MED ORDER — HEPARIN SOD (PORK) LOCK FLUSH 100 UNIT/ML IV SOLN
500.0000 [IU] | Freq: Once | INTRAVENOUS | Status: AC
  Administered 2019-01-09: 500 [IU] via INTRAVENOUS
  Filled 2019-01-09: qty 5

## 2019-01-09 NOTE — Progress Notes (Signed)
Pt was d/c'd per Posey Pronto, MD's orders.  Reviewed her AVS and answered pt's questions pertaining to her medications and f/u appointments.  PIV was deaccessed and intact.  No hematoma was observed.  Pt's port-a-cath was heparin locked and deaccessed.  No s/sx of infection and hematoma was observed.  NT escorted pt in stable condition to medical mall to meet her ride.

## 2019-01-09 NOTE — Discharge Summary (Signed)
[facility] [place] [facility] [place] NAME: Denise [name]    MR#:  [id number]  DATE OF BIRTH:  [birth year]  DATE OF ADMISSION:  01/08/2019 ADMITTING PHYSICIAN: Lang Snow, NP  DATE OF DISCHARGE: 01/09/2019  PRIMARY CARE PHYSICIAN: [name], PA-C    ADMISSION DIAGNOSIS:  Epistaxis [R04.0]  DISCHARGE DIAGNOSIS:  Epistaxis-- status post right knee air packing by ENT Dr. Richardson Landry Hypertension--new onset SECONDARY DIAGNOSIS:   Past Medical History:  Diagnosis Date  . Abnormal breast biopsy    PASH, [name]  . Breast cancer ([place]) 11/2017   left breast; ER/PR/Her2neu POS  . Family history of breast cancer   . Family history of ovarian cancer   . Fibroadenoma of breast, left 2000, 2016  . Genetic screening 08/26/2013   BRCA/BART negative/[facility], CHEK2 POS 2017  . Increased risk of breast cancer 2017   IBIS=47%  . Left breast lump 06/18/2017   Fluid/drained [name]  . Migraine   . Monoallelic mutation of CHEK2 gene in female patient 2017   increased risk of breast and colon cancer  . Personal history of chemotherapy   . Personal history of radiation therapy     HOSPITAL COURSE:  43 y.o. female with past medical history of left breast cancer status post mastectomy and recurrence obstruction on 12/09/2018 on chemo with Kadcyla, last cycle on 12/25/2018 presenting to the ED with complaints of epistaxis.   1. Posterior epistaxis right nare -hgb 10.7. No active bleeding. Patient was seen by Dr. Richardson Landry. Patient underwent right knee air packing. Continue PO antibiotics for 10 days and follow-up with Dr. Richardson Landry on Monday for packing removal. - Avoid strenuous activities, nose picking or blowing and NSAID - BP control better - on prophylactic antibiotics while packed with doxycycline 100 mg p.o. twice daily x10 days  2. Elevated blood pressure -no history of hypertension - PRN labetalol - Toprol XK 25 mg daily--  patient advised to keep log of blood pressure at home for primary care physician to review.  3. Anxiety - Valium as needed  4. History of breast cancer -on chemo with Kadcyla last cycle on 12/25/2018 - Follows with oncology Dr. Mike Gip  5. GERD - Protonix  Overall hemodynamically stable. Discharge to home with outpatient ENT follow-up  CONSULTS OBTAINED:    DRUG ALLERGIES:   Allergies  Allergen Reactions  . Steri-Strip Compound Benzoin [Benzoin Compound]     Steri-strip ,   . Tape Other (See Comments)    Minor skin irritation     DISCHARGE MEDICATIONS:   Allergies as of 01/09/2019      Reactions   Steri-strip Compound Benzoin [benzoin Compound]    Steri-strip ,    Tape Other (See Comments)   Minor skin irritation      Medication List    STOP taking these medications   cephALEXin 500 MG capsule Commonly known as: KEFLEX     TAKE these medications   acetaminophen 500 MG tablet Commonly known as: TYLENOL Take 500 mg by mouth every 6 (six) hours as needed for moderate pain or headache.   diazepam 2 MG tablet Commonly known as: Valium Take 1 tablet (2 mg total) by mouth every 6 (six) hours as needed for anxiety.   doxycycline 100 MG tablet Commonly known as: VIBRA-TABS Take 1 tablet (100 mg total) by mouth every 12 (twelve) hours.   HYDROcodone-acetaminophen 5-325 MG tablet Commonly known as: Norco Take 1 tablet by mouth every 8 (eight)  hours as needed for moderate pain. What changed: when to take this   lidocaine-prilocaine cream Commonly known as: EMLA Apply 1 application topically as needed (port access).   magnesium oxide 400 MG tablet Commonly known as: MAG-OX Take 1 tablet (400 mg total) by mouth daily. What changed: when to take this   metoprolol succinate 25 MG 24 hr tablet Commonly known as: TOPROL-XL Take 1 tablet (25 mg total) by mouth daily.   omeprazole 20 MG tablet Commonly known as: PRILOSEC OTC Take 20 mg by mouth daily as  needed (for acid reflux).   tamoxifen 20 MG tablet Commonly known as: NOLVADEX Take 1 tablet (20 mg total) by mouth daily.       If you experience worsening of your admission symptoms, develop shortness of breath, life threatening emergency, suicidal or homicidal thoughts you must seek medical attention immediately by calling 911 or calling your MD immediately  if symptoms less severe.  You Must read complete instructions/literature along with all the possible adverse reactions/side effects for all the Medicines you take and that have been prescribed to you. Take any new Medicines after you have completely understood and accept all the possible adverse reactions/side effects.   Please note  You were cared for by a hospitalist during your hospital stay. If you have any questions about your discharge medications or the care you received while you were in the hospital after you are discharged, you can call the unit and asked to speak with the hospitalist on call if the hospitalist that took care of you is not available. Once you are discharged, your primary care physician will handle any further medical issues. Please note that NO REFILLS for any discharge medications will be authorized once you are discharged, as it is imperative that you return to your primary care physician (or establish a relationship with a primary care physician if you do not have one) for your aftercare needs so that they can reassess your need for medications and monitor your lab values. Today   SUBJECTIVE   Doing well. No major nose bleed since packing   VITAL SIGNS:  Blood pressure 120/77, pulse 97, temperature 98.5 F (36.9 C), temperature source Oral, resp. rate 18, height _0  (1.676 m), weight 74.8 kg, last menstrual period 12/05/2018, SpO2 98 %.  I/O:    Intake/Output Summary (Last 24 hours) at 01/09/2019 1002 Last data filed at 01/09/2019 0921 Gross per 24 hour  Intake 1069.64 ml  Output 2750 ml  Net  -1680.36 ml    PHYSICAL EXAMINATION:  GENERAL:  43 y.o.-year-old patient lying in the bed with no acute distress.  EYES: Pupils equal, round, reactive to light and accommodation. No scleral icterus. Extraocular muscles intact.  HEENT: Head atraumatic, normocephalic. Oropharynx and nasopharynx clear. Right Nose packing+  NECK:  Supple, no jugular venous distention. No thyroid enlargement, no tenderness.  LUNGS: Normal breath sounds bilaterally, no wheezing, rales,rhonchi or crepitation. No use of accessory muscles of respiration.  CARDIOVASCULAR: S1, S2 normal. No murmurs, rubs, or gallops.  ABDOMEN: Soft, non-tender, non-distended. Bowel sounds present. No organomegaly or mass.  EXTREMITIES: No pedal edema, cyanosis, or clubbing.  NEUROLOGIC: Cranial nerves II through XII are intact. Muscle strength 5/5 in all extremities. Sensation intact. Gait not checked.  PSYCHIATRIC: The patient is alert and oriented x 3.  SKIN: No obvious rash, lesion, or ulcer.   DATA REVIEW:   CBC  Recent Labs  Lab 01/08/19 0624 01/08/19 1623  WBC 5.9  --  HGB 12.7 10.4*  HCT 38.6 31.2*  PLT 217  --     Chemistries  Recent Labs  Lab 01/08/19 0624  NA 141  K 4.1  CL 105  CO2 26  GLUCOSE 129*  BUN 14  CREATININE 0.64  CALCIUM 9.3  AST 28  ALT 23  ALKPHOS 59  BILITOT 0.7    Microbiology Results   Recent Results (from the past 240 hour(s))  SARS Coronavirus 2 (CEPHEID - Performed in [facility] hospital lab), Hosp Order     Status: None   Collection Time: 01/08/19 11:44 AM   Specimen: Oropharyngeal swab; Nasopharyngeal  Result Value Ref Range Status   SARS Coronavirus 2 NEGATIVE NEGATIVE Final    Comment: (NOTE) If result is NEGATIVE SARS-CoV-2 target nucleic acids are NOT DETECTED. The SARS-CoV-2 RNA is generally detectable in upper and lower  respiratory specimens during the acute phase of infection. The lowest  concentration of SARS-CoV-2 viral copies this assay can detect is 250   copies / mL. A negative result does not preclude SARS-CoV-2 infection  and should not be used as the sole basis for treatment or other  patient management decisions.  A negative result may occur with  improper specimen collection / handling, submission of specimen other  than nasopharyngeal swab, presence of viral mutation(s) within the  areas targeted by this assay, and inadequate number of viral copies  (<250 copies / mL). A negative result must be combined with clinical  observations, patient history, and epidemiological information. If result is POSITIVE SARS-CoV-2 target nucleic acids are DETECTED. The SARS-CoV-2 RNA is generally detectable in upper and lower  respiratory specimens dur ing the acute phase of infection.  Positive  results are indicative of active infection with SARS-CoV-2.  Clinical  correlation with patient history and other diagnostic information is  necessary to determine patient infection status.  Positive results do  not rule out bacterial infection or co-infection with other viruses. If result is PRESUMPTIVE POSTIVE SARS-CoV-2 nucleic acids MAY BE PRESENT.   A presumptive positive result was obtained on the submitted specimen  and confirmed on repeat testing.  While 2019 novel coronavirus  (SARS-CoV-2) nucleic acids may be present in the submitted sample  additional confirmatory testing may be necessary for epidemiological  and / or clinical management purposes  to differentiate between  SARS-CoV-2 and other Sarbecovirus currently known to infect humans.  If clinically indicated additional testing with an alternate test  methodology [phone]) is advised. The SARS-CoV-2 RNA is generally  detectable in upper and lower respiratory sp ecimens during the acute  phase of infection. The expected result is Negative. Fact Sheet for Patients:  StrictlyIdeas.no Fact Sheet for Healthcare  Providers: BankingDealers.co.za This test is not yet approved or cleared by the [place] FDA and has been authorized for detection and/or diagnosis of SARS-CoV-2 by FDA under an Emergency Use Authorization (EUA).  This EUA will remain in effect (meaning this test can be used) for the duration of the COVID-19 declaration under Section 564(b)(1) of the Act, 21 U.S.C. section 360bbb-3(b)(1), unless the authorization is terminated or revoked sooner. Performed at [facility], [address] [place], [place] [address]     RADIOLOGY:  Ct Head Wo Contrast  Result Date: 01/08/2019 CLINICAL DATA:  The patient woke up with a severe nose bleed last night. EXAM: CT HEAD WITHOUT CONTRAST TECHNIQUE: Contiguous axial images were obtained from the base of the skull through the vertex without intravenous contrast. COMPARISON:  None. FINDINGS:  Brain: No evidence of acute infarction, hemorrhage, hydrocephalus, extra-axial collection or mass lesion/mass effect. Vascular: No hyperdense vessel or unexpected calcification. Skull: Intact.  No focal lesion. Sinuses/Orbits: Minimal mucosal thickening right sphenoid sinus noted. Other: None. IMPRESSION: Minimal mucosal thickening right sphenoid sinus. The examination is otherwise normal. Electronically Signed   By: Inge Rise M.D.   On: 01/08/2019 07:47     CODE STATUS:     Code Status Orders  (From admission, onward)         Start     Ordered   01/08/19 1224  Full code  Continuous     01/08/19 1224        Code Status History    Date Active Date Inactive Code Status Order ID Comments User Context   12/09/2018 1509 12/10/2018 1354 Full Code [id number]  [name], MD Inpatient   05/20/2018 1440 05/21/2018 1321 Full Code [id number]  [name], MD Inpatient   05/20/2018 1301 05/20/2018 1439 Full Code [id number]  [name], DO Inpatient   Advance Care Planning Activity      TOTAL TIME TAKING  CARE OF THIS PATIENT: *40* minutes.    Fritzi Mandes M.D on 01/09/2019 at 10:02 AM  Between 7am to 6pm - Pager - [phone] After 6pm go to [email or web address] - password [email or web address] [facility]  Office  [phone]  CC: Primary care physician; [place], [employer], PA-C

## 2019-01-09 NOTE — Plan of Care (Signed)
  Problem: Education: Goal: Knowledge of General Education information will improve Description: Including pain rating scale, medication(s)/side effects and non-pharmacologic comfort measures Outcome: Adequate for Discharge Review AVS with pt prior to d/c.  Pt had no further questions and concerns.   Problem: Clinical Measurements: Goal: Will remain free from infection Outcome: Adequate for Discharge S/Sx of infection monitored and assessed q-shift.  Pt has remained afebrile thus far.  She was given her PO abx per MD's orders prior to d/c.  Reviewed meds with pt with her AVS.  Informed pt that her abx was ordered q12 hours.  Pt understood and had no further questions.   Problem: Activity: Goal: Risk for activity intolerance will decrease Outcome: Adequate for Discharge Pt is independent and ambulatory.  She needs no assistance with ADLs.

## 2019-01-09 NOTE — Progress Notes (Signed)
Complains of sore throat and ear; cepacol lozenges ordered per previous order. Bronc Brosseau K; 4:13 AM; 01/09/2019

## 2019-01-10 ENCOUNTER — Encounter: Payer: Self-pay | Admitting: General Surgery

## 2019-01-13 NOTE — Progress Notes (Signed)
[facility]  [address], [address] [place], [place] [address] Phone: [phone]  Fax: [phone]   Clinic Day:  01/15/2019  Referring physician: [name],*  Chief Complaint: Denise [name] is a 43 y.o. female with multi-focal Her2/neu + left breast cancer who is seen for 3 week assessment prior to cycle #9Kadcyla.  HPI: The patient was last seen in the medical oncology clinic on 12/19/2018. At that time, she was recovering well from surgery.  She had a drain in place.  She was seen in surgery by [name] on 12/24/2018. She was doing well s/p mastectomy.  She received cycle #8 Kadcyla on 12/25/2018.  Echo on 12/25/2018 showed EF of 60-65%.   She was admitted to [facility] from 01/08/2019-01/09/2019 for persistent epistaxis.  She was also noted to have blood coming from her right tear duct and hypertension.  Head CT revealed minimal mucosal thickening of the right sphenoid sinus and was otherwise normal. She underwent right nare airpacking and was put on doxycycline 161m x10 days. Etiology was believed to be high blood pressure. She was discharged on metoprolol and planned follow-up in ENT.   During the interim, she is much improved following her hospitalization. She notes rhinorrhea but denies any further epistaxis. She has improved significantly since right nair packing was removed. She notes congestion in her right ear, with mildly decreased hearing.   Blood pressure in the clinic today is 118/78. She will follow-up with her PCP regarding surveillance of hypertension.   She is agreeable to discontinue Kadcyla maintenance and switch to Herceptin maintenance therapy secondary to risk of recurrent epistaxis and hemorrhage.    Past Medical History:  Diagnosis Date   Abnormal breast biopsy    [facility], Dr. BBary Castilla  Breast cancer ([facility] 11/2017   left breast; ER/PR/Her2neu POS   Family history of breast cancer    Family history of ovarian  cancer    Fibroadenoma of breast, left 2000, 2016   Genetic screening 08/26/2013   BRCA/BART negative/Myriad, CHEK2 POS 2017   Increased risk of breast cancer 2017   IBIS=47%   Left breast lump 06/18/2017   Fluid/drained [name]   Migraine    Monoallelic mutation of CHEK2 gene in female patient 2017   increased risk of breast and colon cancer   Personal history of chemotherapy    Personal history of radiation therapy     Past Surgical History:  Procedure Laterality Date   BREAST BIOPSY Bilateral 09/08/14   neg   BREAST BIOPSY Left 03/16/2015   Procedure: BREAST BIOPSY WITH NEEDLE LOCALIZATION;  Surgeon: JRobert Bellow MD;  Location: [facility] ORS;  Service: General;  Laterality: Left;   BREAST BIOPSY Left 11/2017   positive   BREAST CYST ASPIRATION Left 06/18/2017   cyst aspiration   BREAST EXCISIONAL BIOPSY Left 09/2014   BREAST EXCISIONAL BIOPSY Right    age 43's  BREAST RECONSTRUCTION WITH PLACEMENT OF TISSUE EXPANDER AND FLEX HD (ACELLULAR HYDRATED DERMIS) Left 05/20/2018   Procedure: BREAST RECONSTRUCTION WITH PLACEMENT OF TISSUE EXPANDER AND FLEX HD (ACELLULAR HYDRATED DERMIS);  Surgeon: DWallace Going DO;  Location: [facility] ORS;  Service: Plastics;  Laterality: Left;   BREAST RECONSTRUCTION WITH PLACEMENT OF TISSUE EXPANDER AND FLEX HD (ACELLULAR HYDRATED DERMIS) Right 12/09/2018   Procedure: BREAST RECONSTRUCTION WITH PLACEMENT OF TISSUE EXPANDER AND FLEX HD (ACELLULAR HYDRATED DERMIS);  Surgeon: DWallace Going DO;  Location: [facility] ORS;  Service: Plastics;  Laterality: Right;   BREAST  SURGERY Right March 2000   benign fibroadenoma   BREAST SURGERY Left 08/08/13   excision   BREAST SURGERY Left 09/23/14   excision   MASTECTOMY Left 05/20/2018   MASTECTOMY W/ SENTINEL NODE BIOPSY Left 05/20/2018   Procedure: MASTECTOMY WITH SENTINEL LYMPH NODE BIOPSY;  Surgeon: [name], MD;  Location: [facility] ORS;  Service: General;  Laterality: Left;    PORTACATH PLACEMENT Right 12/17/2017   Procedure: INSERTION PORT-A-CATH;  Surgeon: [name], MD;  Location: [facility] ORS;  Service: General;  Laterality: Right;   SIMPLE MASTECTOMY WITH AXILLARY SENTINEL NODE BIOPSY Right 12/09/2018   Procedure: SIMPLE MASTECTOMY RIGHT;  Surgeon: [name], MD;  Location: [facility] ORS;  Service: General;  Laterality: Right;    Family History  Problem Relation Age of Onset   Prostate cancer Father 31       requiring tx/hormones   Breast cancer Paternal Grandmother 31   Breast cancer Maternal Aunt 60   Ovarian cancer Maternal Aunt 70   Breast cancer Paternal Aunt 60   Pancreatic cancer Maternal Aunt 75    Social History:  reports that she has never smoked. She has never used smokeless tobacco. She reports current alcohol use. She reports that she does not use drugs. She does not smoke. She "occasionally" drinks alcohol. 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 Patient denies known exposures to radiation on toxins. Her husband's name is [name]. She has 2 daughters, [name] and [name] (ages 53 1/2 and 15). Her husband's name is [name].  The patient is alone today.  Allergies:  Allergies  Allergen Reactions   Steri-Strip Compound Benzoin [Benzoin Compound]     Steri-strip ,    Tape Other (See Comments)    Minor skin irritation     Current Medications: Current Outpatient Medications  Medication Sig Dispense Refill   doxycycline (VIBRA-TABS) 100 MG tablet Take 1 tablet (100 mg total) by mouth every 12 (twelve) hours. 20 tablet 0   HYDROcodone-acetaminophen (NORCO) 5-325 MG tablet Take 1 tablet by mouth every 8 (eight) hours as needed for moderate pain. 15 tablet 0   metoprolol succinate (TOPROL-XL) 25 MG 24 hr tablet Take 1 tablet (25 mg total) by mouth daily. 30 tablet 0   tamoxifen (NOLVADEX) 20 MG tablet Take 1 tablet (20 mg total) by mouth daily. 30 tablet 3   acetaminophen (TYLENOL) 500 MG tablet Take 500  mg by mouth every 6 (six) hours as needed for moderate pain or headache.     diazepam (VALIUM) 2 MG tablet Take 1 tablet (2 mg total) by mouth every 6 (six) hours as needed for anxiety. (Patient not taking: Reported on 01/15/2019) 30 tablet 0   lidocaine-prilocaine (EMLA) cream Apply 1 application topically as needed (port access).     magnesium oxide (MAG-OX) 400 MG tablet Take 1 tablet (400 mg total) by mouth daily. (Patient not taking: Reported on 01/15/2019) 30 tablet 0   omeprazole (PRILOSEC OTC) 20 MG tablet Take 20 mg by mouth daily as needed (for acid reflux).     No current facility-administered medications for this visit.    Facility-Administered Medications Ordered in Other Visits  Medication Dose Route Frequency Provider Last Rate Last Dose   heparin lock flush 100 unit/mL  500 Units Intravenous Once Kaydense Rizo C, MD       sodium chloride flush (NS) 0.9 % injection 10 mL  10 mL Intravenous PRN Lequita Asal, MD   10 mL at 04/17/18  0844   sodium chloride flush (NS) 0.9 % injection 10 mL  10 mL Intravenous PRN Nolon Stalls C, MD   10 mL at 01/15/19 1002    Review of Systems  Constitutional: Positive for weight loss (1 pound). Negative for chills, diaphoresis (no night sweats or hot flashes), fever and malaise/fatigue.       Doing well.  HENT: Positive for congestion (right ear), hearing loss (right ear secondary to congestion) and nosebleeds (resolved). Negative for ear discharge, ear pain, sinus pain, sore throat and tinnitus.        Rhinorrhea   Eyes: Negative.  Negative for blurred vision, double vision, photophobia, pain, discharge and redness.       No visual changes.  Respiratory: Negative.  Negative for cough, hemoptysis, sputum production and shortness of breath.   Cardiovascular: Negative.  Negative for chest pain, palpitations, orthopnea, leg swelling and PND.  Gastrointestinal: Negative.  Negative for abdominal pain, blood in stool, constipation,  diarrhea, heartburn, melena, nausea and vomiting.  Genitourinary: Negative.  Negative for dysuria, frequency, hematuria and urgency.  Musculoskeletal: Negative for back pain, falls, joint pain, myalgias and neck pain.  Skin: Negative.  Negative for itching and rash.  Neurological: Negative.  Negative for dizziness, tremors, sensory change, speech change, focal weakness, weakness and headaches.  Endo/Heme/Allergies: Negative.  Does not bruise/bleed easily.       Diabetes- controlled.  Psychiatric/Behavioral: Negative.  Negative for depression and memory loss. The patient is not nervous/anxious and does not have insomnia.   All other systems reviewed and are negative.  Performance status (ECOG): 1  Vitals Blood pressure 118/78, pulse 84, temperature (!) 97.3 F (36.3 C), temperature source Tympanic, resp. rate 18, height 5' 6"  (1.676 m), weight 185 lb 1.2 oz (84 kg), SpO2 100 %.   Physical Exam  Constitutional: She is oriented to person, place, and time. Vital signs are normal. She appears well-developed and well-nourished. No distress.  HENT:  Head: Normocephalic and atraumatic.  Right Ear: No drainage or swelling. Tympanic membrane is not erythematous. There is hemotympanum (rim of bloodaround tympanic membrane).  Left Ear: No drainage or swelling. Tympanic membrane is not erythematous.  Nose: No epistaxis.  Mouth/Throat: Oropharynx is clear and moist. No oropharyngeal exudate.  Short dark hair. Wearing mask.  Eyes: Pupils are equal, round, and reactive to light. Conjunctivae and EOM are normal. No scleral icterus.  Brown eyes.  Neck: Normal range of motion. Neck supple. No JVD present.  Cardiovascular: Normal rate, regular rhythm and normal heart sounds. Exam reveals no gallop and no friction rub.  No murmur heard. Pulmonary/Chest: Effort normal and breath sounds normal. No respiratory distress. She has no wheezes. She has no rales. Breasts are symmetrical (left breast with fibrocystic  changes superiorly).  Abdominal: Soft. Bowel sounds are normal. She exhibits no distension and no mass. There is no abdominal tenderness. There is no rebound and no guarding.  Musculoskeletal: Normal range of motion.        General: No tenderness or edema.  Lymphadenopathy:    She has no cervical adenopathy.    She has no axillary adenopathy.       Right: No supraclavicular adenopathy present.       Left: No supraclavicular adenopathy present.  Neurological: She is alert and oriented to person, place, and time.  Skin: Skin is warm and dry. No rash noted. She is not diaphoretic. No erythema. There is pallor.  Psychiatric: She has a normal mood and affect. Her behavior is  normal. Judgment and thought content normal.  Nursing note and vitals reviewed.   Infusion on 01/15/2019  Component Date Value Ref Range Status   Magnesium 01/15/2019 1.8  1.7 - 2.4 mg/dL Final   Performed at [facility], [address] [place], [place] [address]   Sodium 01/15/2019 136  135 - 145 mmol/L Final   Potassium 01/15/2019 3.6  3.5 - 5.1 mmol/L Final   Chloride 01/15/2019 105  98 - 111 mmol/L Final   CO2 01/15/2019 21* 22 - 32 mmol/L Final   Glucose, Bld 01/15/2019 129* 70 - 99 mg/dL Final   BUN 01/15/2019 17  6 - 20 mg/dL Final   Creatinine, Ser 01/15/2019 0.73  0.44 - 1.00 mg/dL Final   Calcium 01/15/2019 8.7* 8.9 - 10.3 mg/dL Final   Total Protein 01/15/2019 7.2  6.5 - 8.1 g/dL Final   Albumin 01/15/2019 3.7  3.5 - 5.0 g/dL Final   AST 01/15/2019 26  15 - 41 U/L Final   ALT 01/15/2019 18  0 - 44 U/L Final   Alkaline Phosphatase 01/15/2019 59  38 - 126 U/L Final   Total Bilirubin 01/15/2019 0.4  0.3 - 1.2 mg/dL Final   GFR calc non Af Amer 01/15/2019 >60  >60 mL/min Final   GFR calc Af Amer 01/15/2019 >60  >60 mL/min Final   Anion gap 01/15/2019 10  5 - 15 Final   Performed at [facility], [address] [place], [place] [address]   WBC 01/15/2019 5.6   4.0 - 10.5 K/uL Final   RBC 01/15/2019 3.07* 3.87 - 5.11 MIL/uL Final   Hemoglobin 01/15/2019 8.8* 12.0 - 15.0 g/dL Final   HCT 01/15/2019 26.7* 36.0 - 46.0 % Final   MCV 01/15/2019 87.0  80.0 - 100.0 fL Final   MCH 01/15/2019 28.7  26.0 - 34.0 pg Final   MCHC 01/15/2019 33.0  30.0 - 36.0 g/dL Final   RDW 01/15/2019 15.3  11.5 - 15.5 % Final   Platelets 01/15/2019 252  150 - 400 K/uL Final   nRBC 01/15/2019 0.0  0.0 - 0.2 % Final   Neutrophils Relative % 01/15/2019 60  % Final   Neutro Abs 01/15/2019 3.4  1.7 - 7.7 K/uL Final   Lymphocytes Relative 01/15/2019 28  % Final   Lymphs Abs 01/15/2019 1.6  0.7 - 4.0 K/uL Final   Monocytes Relative 01/15/2019 7  % Final   Monocytes Absolute 01/15/2019 0.4  0.1 - 1.0 K/uL Final   Eosinophils Relative 01/15/2019 3  % Final   Eosinophils Absolute 01/15/2019 0.2  0.0 - 0.5 K/uL Final   Basophils Relative 01/15/2019 1  % Final   Basophils Absolute 01/15/2019 0.1  0.0 - 0.1 K/uL Final   Immature Granulocytes 01/15/2019 1  % Final   Abs Immature Granulocytes 01/15/2019 0.04  0.00 - 0.07 K/uL Final   Performed at [facility], [address], [place], [place] [address]    Assessment:  Denise [name] is a 43 y.o. female with multi-focal stage IB Her2/neu + left breast cancers/p neoadjuvant chemotherapy followed by mastectomywith sentinel lymph node biopsy on 05/20/2018. Pathologyrevealed no residual carcinoma in the breast. One of three sentinel lymph nodes were positive. One lymph node had 2 metastatic foci (2.1 mm and 1 mm). Pathologic stagerevealed ypT0 ypN1a.  Index breast mass and axillary nodebiopsyon 12/06/2017 revealed grade II invasive ductal carcinoma with calcifications at the 11 o'clock position. There was metastatic carcinoma in 1 of  1 lymph nodes. Tumor was ER + (100%), PR + (100%), and Ki67 5%. Her2/neu was 3+ by IHC (heterogeneous) and FISH +.  Diagnostic left mammogram and ultrasoundon  12/06/2017 revealed a 1.7 x 1.3 x 1.3 cm irregular hypoechoic mass with internal calcifications at the 11 o'clock position 2 cm from the nipple with internal calcifications. There was a similar appearing 0.9 x 0.8 x 0.7 cm mass at the 11 o'clock position 4 cm from the nipple (2.4 cm from the index lesion). There were suspicious, segmental calcifications originating from the index lesion and extending 5 cm posteriorly. There was a 0.6 cm morphologically abnormal left axillary lymph node.  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 The adjacent and more superiorly located 11 o'clock mass (7 x 12 mm) seen on recent ultrasound, 4 cm from the nipple on ultrasound, was also identified. There were 2 probable satellite lesions (7 mm, medial lesion; posterior and lateral lesion). The total span of malignancywas 3 x 3.1 x 4.5 cm. There were calcificationsextending up to 5 cm posterior to the biopsied malignancy which were highly suspicious on mammography but not appreciated. There was a mass in the lower outer left breast measured 5 mm, representing a change. The known metastatic nodein the left axilla was identified. A node along the posterolateral margin of the pectoralis minor (cortex 5 mm) was nonspecific but at least somewhat suspicious.  Chest, abdomen, and pelvic CTon 12/19/2017 revealed a solitary enlarged biopsy-proven metastatic left axillary lymph node. There were no additional findings suspicious for metastatic disease in the chest, abdomen or pelvis. There was a nonspecific tiny sclerotic upper right sacral lesion, more likely a benign bone island. Bone scintigraphy correlation versus follow-up CT could be considered.  Bone scanon 01/11/2018 was negative for metastatic pattern. No areas of focal tracer uptake. Area of concern in sacrum on CT likely represents benign bone  island.   Myriad genetic testingon 08/26/2013 was negative for BRCA1 and 2. CHEK2 mutationpositive (increased risk of breast and colon cancer). She has a family history significant for breast, ovarian, pancreatic, and prostate cancer.  She received 6 cycles of TCHP chemotherapy (12/21/2017 - 04/17/2018) with Margarette Canada support. Cycle #3 was held due to elevated LFTs on 02/04/2018. She received Herceptin + Perjeta alone (04/30/2018 - 05/30/2018). She received 8cycles of Kadcyla(06/20/2018 - 07/11/2018; 08/20/2018- 12/25/2018). Cycle #3 was held on 08/01/2018 due to visual changes (blurred vision).  Cycle #8 was complicated by epistaxis requiring admission.  She received breast radiationfrom 06/26/2018 - 08/05/2018.  She underwent right prophylactic mastectomy with reconstruction on 12/09/2018.  Pathology on 12/10/2018 revealed benign breast tissue.  Sentinel lymph node was negative.   Echocardiograms: EF 60-65% on 12/20/2017, 60-65% on 03/27/2018, 55-60% on 06/18/2018, 55-60% on 09/27/2018, and 60-65% on 12/25/2018.  She has a history of elevated LFTs. Hepatitis B and C serologies were negative on 02/04/2018.RUQ ultrasound on 02/06/2018 revealed a small anterior gallbladder wall polyp. There was no evidence of cholelithiasis or cholecystis. CBD measured normal at 2 mm, with no evidence of choledocholithiasis. There was normal direction of blood flow towards the liver noted.  She has vasomotor symptoms. She discontinued Effexor around 09/18/2018. She was amenorrheic from 11/2017 - 10/15/2018. [place] and estradiol confirmed a premenopausalstate on 10/01/2018. Menses restarted on 10/15/2018.  She was admitted to [facility] from 01/08/2019 - 01/09/2019 for persistent epistaxis.  Head CT revealed minimal mucosal thickening of the  right sphenoid sinus and was otherwise normal. She underwent right nare airpacking.  She is on doxycycline 122m x10 days. Etiology was felt secondary to high blood  pressure. She was discharged on metoprolol.  Symptomatically, she is fatigued.  She has had no further epistaxis.  She notes a sensation of right ear being plugged.  Exam reveals a rim of blood around the right tympanic membrane    Hemoglobin is 8.4.  Plan: 1.   Labs today: CBC with diff, CMP, Mg, iron studies, ferritin, retic count. 2.   Stage IB multifocal LEFT breast cancer Clinically, she is doing well.   She is s/p prophylactic mastectomy with reconstruction on 12/09/2018.   She has received 45 of a planned 52 weeks of HER-2/neu directed therapy up to today's date.     Pharmacy has calculated 48 weeks.  Discuss plan for either completion of therapy (began 12/21/2017) or up to 2 additional cycles.   Patient wishes to continue therapy.  Discus concern for possible Kadcyla induced epistaxis (see below).   Discuss discontinuation of Kadcyla.   Patient in agreement.  Discuss trastuzumab (Kanjinti- biosimilar) every 3 weeks x 2.   Patient can receive on Friday, 01/17/2019.   Patient in agreement. Continue tamoxifen. 3.   Vision changes Patient has had no further visual changes due to Kadcyla.  Patient should have no additional issues secondary to discontinuation of Kadcyla.            Continue to monitor.   4.   History of abnormal LFTs Liver function tests are normal. Continue to monitor. 5.   Epistaxis  Kadcyla associated with hemorrhage (29% to 32%; grades 3/4: ?2%).  Hemorrhagic events have been documented in CNS, respiratory, and GI tract.  Some hemorrhages were fatal.   Some events occurred in patients receiving anticoagulation, antiplatelet therapy, or with thrombocytopenia.  Bleeding occurred in patients without other risk factors.  Patient also informed that Herceptin has been associated with epistaxis in 2% patients and < 1% hemorrhage. 6.   Iron deficiency anemia  Hematocrit 26.7.  Hemoglobin 8.8.  MCV 87.0.  Anemia secondary to significant  epistaxis.  Discuss iron rich foods and oral ion. 7.   RTC on 01/17/2019 (Friday) for labs (CBC with diff, CMP) and Kanjinti (biosimilar). 8.   RTC 3 weeks from Friday for MD assessment, labs (CBC with diff, CMP, ferritin), and Herceptin (biosimilar).  Addendum:  Patient reported melena on Friday, 01/17/2019.  She denied any further epistaxis or symptoms of post nasal drip.  Hemoglobin had decreased from 10.4 to 8.8 to 8.4.  Discussed follow-up ENT evaluation.  Dr WAllen Norris gastroenterology contacted.  Patient scheduled for evaluation on 01/20/2019.  I discussed the assessment and treatment plan with the patient.  The patient was provided an opportunity to ask questions and all were answered.  The patient agreed with the plan and demonstrated an understanding of the instructions.  The patient was advised to call back if the symptoms worsen or if the condition fails to improve as anticipated.  I provided 35 minutes of face-to-face time during this this encounter and > 50% was spent counseling as documented under my assessment and plan.    MLequita Asal MD, PhD    01/15/2019, 10:27 AM     I, Molly Dorshimer, am acting as sEducation administratorfor [employer] CMike Gip MD, PhD.  I, Shyvonne Chastang C. CMike Gip MD, have reviewed the above documentation for accuracy and completeness, and I agree with the above.

## 2019-01-14 ENCOUNTER — Other Ambulatory Visit: Payer: Self-pay | Admitting: Hematology and Oncology

## 2019-01-14 ENCOUNTER — Ambulatory Visit: Payer: BC Managed Care – PPO | Admitting: Physical Therapy

## 2019-01-14 DIAGNOSIS — C50412 Malignant neoplasm of upper-outer quadrant of left female breast: Secondary | ICD-10-CM

## 2019-01-14 DIAGNOSIS — Z170 Estrogen receptor positive status [ER+]: Secondary | ICD-10-CM

## 2019-01-14 DIAGNOSIS — N6310 Unspecified lump in the right breast, unspecified quadrant: Secondary | ICD-10-CM

## 2019-01-15 ENCOUNTER — Other Ambulatory Visit: Payer: Self-pay

## 2019-01-15 ENCOUNTER — Inpatient Hospital Stay: Payer: BC Managed Care – PPO

## 2019-01-15 ENCOUNTER — Telehealth: Payer: Self-pay

## 2019-01-15 ENCOUNTER — Encounter: Payer: Self-pay | Admitting: Hematology and Oncology

## 2019-01-15 ENCOUNTER — Inpatient Hospital Stay (HOSPITAL_BASED_OUTPATIENT_CLINIC_OR_DEPARTMENT_OTHER): Payer: BC Managed Care – PPO | Admitting: Hematology and Oncology

## 2019-01-15 VITALS — BP 118/78 | HR 84 | Temp 97.3°F | Resp 18 | Ht 66.0 in | Wt 185.1 lb

## 2019-01-15 DIAGNOSIS — D649 Anemia, unspecified: Secondary | ICD-10-CM

## 2019-01-15 DIAGNOSIS — Z5112 Encounter for antineoplastic immunotherapy: Secondary | ICD-10-CM

## 2019-01-15 DIAGNOSIS — C50419 Malignant neoplasm of upper-outer quadrant of unspecified female breast: Secondary | ICD-10-CM

## 2019-01-15 DIAGNOSIS — R040 Epistaxis: Secondary | ICD-10-CM

## 2019-01-15 DIAGNOSIS — C50512 Malignant neoplasm of lower-outer quadrant of left female breast: Secondary | ICD-10-CM | POA: Diagnosis not present

## 2019-01-15 LAB — IRON AND TIBC
Iron: 36 ug/dL (ref 28–170)
Saturation Ratios: 9 % — ABNORMAL LOW (ref 10.4–31.8)
TIBC: 411 ug/dL (ref 250–450)
UIBC: 375 ug/dL

## 2019-01-15 LAB — RETICULOCYTES
Immature Retic Fract: 28.6 % — ABNORMAL HIGH (ref 2.3–15.9)
RBC.: 3.06 MIL/uL — ABNORMAL LOW (ref 3.87–5.11)
Retic Count, Absolute: 185.1 10*3/uL (ref 19.0–186.0)
Retic Ct Pct: 6.1 % — ABNORMAL HIGH (ref 0.4–3.1)

## 2019-01-15 LAB — CBC WITH DIFFERENTIAL/PLATELET
Abs Immature Granulocytes: 0.04 10*3/uL (ref 0.00–0.07)
Basophils Absolute: 0.1 10*3/uL (ref 0.0–0.1)
Basophils Relative: 1 %
Eosinophils Absolute: 0.2 10*3/uL (ref 0.0–0.5)
Eosinophils Relative: 3 %
HCT: 26.7 % — ABNORMAL LOW (ref 36.0–46.0)
Hemoglobin: 8.8 g/dL — ABNORMAL LOW (ref 12.0–15.0)
Immature Granulocytes: 1 %
Lymphocytes Relative: 28 %
Lymphs Abs: 1.6 10*3/uL (ref 0.7–4.0)
MCH: 28.7 pg (ref 26.0–34.0)
MCHC: 33 g/dL (ref 30.0–36.0)
MCV: 87 fL (ref 80.0–100.0)
Monocytes Absolute: 0.4 10*3/uL (ref 0.1–1.0)
Monocytes Relative: 7 %
Neutro Abs: 3.4 10*3/uL (ref 1.7–7.7)
Neutrophils Relative %: 60 %
Platelets: 252 10*3/uL (ref 150–400)
RBC: 3.07 MIL/uL — ABNORMAL LOW (ref 3.87–5.11)
RDW: 15.3 % (ref 11.5–15.5)
WBC: 5.6 10*3/uL (ref 4.0–10.5)
nRBC: 0 % (ref 0.0–0.2)

## 2019-01-15 LAB — COMPREHENSIVE METABOLIC PANEL
ALT: 18 U/L (ref 0–44)
AST: 26 U/L (ref 15–41)
Albumin: 3.7 g/dL (ref 3.5–5.0)
Alkaline Phosphatase: 59 U/L (ref 38–126)
Anion gap: 10 (ref 5–15)
BUN: 17 mg/dL (ref 6–20)
CO2: 21 mmol/L — ABNORMAL LOW (ref 22–32)
Calcium: 8.7 mg/dL — ABNORMAL LOW (ref 8.9–10.3)
Chloride: 105 mmol/L (ref 98–111)
Creatinine, Ser: 0.73 mg/dL (ref 0.44–1.00)
GFR calc Af Amer: >60 mL/min (ref >60)
GFR calc non Af Amer: >60 mL/min (ref >60)
Glucose, Bld: 129 mg/dL — ABNORMAL HIGH (ref 70–99)
Potassium: 3.6 mmol/L (ref 3.5–5.1)
Sodium: 136 mmol/L (ref 135–145)
Total Bilirubin: 0.4 mg/dL (ref 0.3–1.2)
Total Protein: 7.2 g/dL (ref 6.5–8.1)

## 2019-01-15 LAB — FERRITIN: Ferritin: 31 ng/mL (ref 11–307)

## 2019-01-15 LAB — MAGNESIUM: Magnesium: 1.8 mg/dL (ref 1.7–2.4)

## 2019-01-15 MED ORDER — SODIUM CHLORIDE 0.9% FLUSH
10.0000 mL | INTRAVENOUS | Status: DC | PRN
  Administered 2019-01-15: 10 mL via INTRAVENOUS
  Filled 2019-01-15: qty 10

## 2019-01-15 MED ORDER — HEPARIN SOD (PORK) LOCK FLUSH 100 UNIT/ML IV SOLN
500.0000 [IU] | Freq: Once | INTRAVENOUS | Status: AC
  Administered 2019-01-15: 11:00:00 500 [IU] via INTRAVENOUS

## 2019-01-15 NOTE — Telephone Encounter (Signed)
-----   Message from Lequita Asal, MD sent at 01/15/2019  4:20 PM EDT ----- Regarding: Please call patient  Iron studies a little low.  Iron rich foods.  Can take oral iron (ferrous sulfate) 1 tablet a day with OJ or vitamin C.  M ----- Message ----- From: Interface, Lab In [place] Sent: 01/15/2019   3:18 PM EDT To: Lequita Asal, MD

## 2019-01-15 NOTE — Telephone Encounter (Signed)
Spoke with the patient to inform her that her Iron was low and per Dr Mike Gip she would like for the patient to start eating Iron rich foods. I also informed her she can start taking ferrous sulfate 325 mg 1 tab po daily, with orange juice or vitamin C. The patient states she will try the iron rich foods first and when she have her labs checked if she need to she will try the Ferrous sulfate. The patient states it will be tomorrow before she can start.

## 2019-01-15 NOTE — Progress Notes (Signed)
No new changes noted today 

## 2019-01-16 ENCOUNTER — Ambulatory Visit: Payer: BC Managed Care – PPO | Admitting: Physical Therapy

## 2019-01-17 ENCOUNTER — Inpatient Hospital Stay: Payer: BC Managed Care – PPO

## 2019-01-17 ENCOUNTER — Other Ambulatory Visit
Admission: RE | Admit: 2019-01-17 | Discharge: 2019-01-17 | Disposition: A | Discharge status: Home / Self Care | Payer: BC Managed Care – PPO | Source: Ambulatory Visit | Attending: Gastroenterology | Admitting: Gastroenterology

## 2019-01-17 ENCOUNTER — Other Ambulatory Visit: Payer: Self-pay

## 2019-01-17 ENCOUNTER — Other Ambulatory Visit: Payer: Self-pay | Admitting: Hematology and Oncology

## 2019-01-17 VITALS — BP 116/80 | HR 96 | Temp 97.0°F | Resp 18

## 2019-01-17 DIAGNOSIS — K922 Gastrointestinal hemorrhage, unspecified: Secondary | ICD-10-CM

## 2019-01-17 DIAGNOSIS — C50419 Malignant neoplasm of upper-outer quadrant of unspecified female breast: Secondary | ICD-10-CM

## 2019-01-17 DIAGNOSIS — Z01812 Encounter for preprocedural laboratory examination: Secondary | ICD-10-CM | POA: Insufficient documentation

## 2019-01-17 DIAGNOSIS — Z20828 Contact with and (suspected) exposure to other viral communicable diseases: Secondary | ICD-10-CM | POA: Diagnosis not present

## 2019-01-17 DIAGNOSIS — Z5112 Encounter for antineoplastic immunotherapy: Secondary | ICD-10-CM

## 2019-01-17 DIAGNOSIS — C50512 Malignant neoplasm of lower-outer quadrant of left female breast: Secondary | ICD-10-CM | POA: Diagnosis not present

## 2019-01-17 DIAGNOSIS — C50412 Malignant neoplasm of upper-outer quadrant of left female breast: Secondary | ICD-10-CM

## 2019-01-17 LAB — CBC WITH DIFFERENTIAL/PLATELET
Abs Immature Granulocytes: 0.03 10*3/uL (ref 0.00–0.07)
Basophils Absolute: 0 10*3/uL (ref 0.0–0.1)
Basophils Relative: 1 %
Eosinophils Absolute: 0.2 10*3/uL (ref 0.0–0.5)
Eosinophils Relative: 4 %
HCT: 25.8 % — ABNORMAL LOW (ref 36.0–46.0)
Hemoglobin: 8.4 g/dL — ABNORMAL LOW (ref 12.0–15.0)
Immature Granulocytes: 1 %
Lymphocytes Relative: 30 %
Lymphs Abs: 1.5 10*3/uL (ref 0.7–4.0)
MCH: 28.3 pg (ref 26.0–34.0)
MCHC: 32.6 g/dL (ref 30.0–36.0)
MCV: 86.9 fL (ref 80.0–100.0)
Monocytes Absolute: 0.4 10*3/uL (ref 0.1–1.0)
Monocytes Relative: 7 %
Neutro Abs: 2.8 10*3/uL (ref 1.7–7.7)
Neutrophils Relative %: 57 %
Platelets: 244 10*3/uL (ref 150–400)
RBC: 2.97 MIL/uL — ABNORMAL LOW (ref 3.87–5.11)
RDW: 15 % (ref 11.5–15.5)
WBC: 4.9 10*3/uL (ref 4.0–10.5)
nRBC: 0 % (ref 0.0–0.2)

## 2019-01-17 LAB — PREGNANCY, URINE: Preg Test, Ur: NEGATIVE

## 2019-01-17 LAB — COMPREHENSIVE METABOLIC PANEL
ALT: 15 U/L (ref 0–44)
AST: 24 U/L (ref 15–41)
Albumin: 3.4 g/dL — ABNORMAL LOW (ref 3.5–5.0)
Alkaline Phosphatase: 54 U/L (ref 38–126)
Anion gap: 8 (ref 5–15)
BUN: 18 mg/dL (ref 6–20)
CO2: 22 mmol/L (ref 22–32)
Calcium: 8.7 mg/dL — ABNORMAL LOW (ref 8.9–10.3)
Chloride: 106 mmol/L (ref 98–111)
Creatinine, Ser: 0.72 mg/dL (ref 0.44–1.00)
GFR calc Af Amer: >60 mL/min (ref >60)
GFR calc non Af Amer: >60 mL/min (ref >60)
Glucose, Bld: 165 mg/dL — ABNORMAL HIGH (ref 70–99)
Potassium: 3.7 mmol/L (ref 3.5–5.1)
Sodium: 136 mmol/L (ref 135–145)
Total Bilirubin: 0.4 mg/dL (ref 0.3–1.2)
Total Protein: 6.5 g/dL (ref 6.5–8.1)

## 2019-01-17 LAB — FERRITIN: Ferritin: 24 ng/mL (ref 11–307)

## 2019-01-17 LAB — SARS CORONAVIRUS 2 (TAT 6-24 HRS): SARS Coronavirus 2: NEGATIVE

## 2019-01-17 LAB — MAGNESIUM: Magnesium: 1.7 mg/dL (ref 1.7–2.4)

## 2019-01-17 MED ORDER — SODIUM CHLORIDE 0.9% FLUSH
10.0000 mL | INTRAVENOUS | Status: DC | PRN
  Administered 2019-01-17: 10 mL via INTRAVENOUS
  Filled 2019-01-17: qty 10

## 2019-01-17 MED ORDER — HEPARIN SOD (PORK) LOCK FLUSH 100 UNIT/ML IV SOLN
500.0000 [IU] | Freq: Once | INTRAVENOUS | Status: AC
  Administered 2019-01-17: 500 [IU] via INTRAVENOUS

## 2019-01-17 MED ORDER — TRASTUZUMAB-ANNS CHEMO 150 MG IV SOLR
500.0000 mg | Freq: Once | INTRAVENOUS | Status: DC
  Filled 2019-01-17: qty 23.8

## 2019-01-17 NOTE — Progress Notes (Signed)
Patient's hemoglobin is continuing to decline from recent nose bleed.  No signs of bleeding noted but patient reported having dark hard stools over the past two days.  However, she has increased her iron intake and is recovering from dehydration after her nosebleed episode.  Patient instructed that she will have another covid testing and an upper endoscopy in the next few days.  Patient verbalized understanding and was given instructions by MD.

## 2019-01-20 ENCOUNTER — Ambulatory Visit
Admission: RE | Admit: 2019-01-20 | Discharge: 2019-01-20 | Disposition: A | Discharge status: Home / Self Care | Payer: BC Managed Care – PPO | Source: Ambulatory Visit | Attending: Gastroenterology | Admitting: Gastroenterology

## 2019-01-20 ENCOUNTER — Other Ambulatory Visit: Payer: Self-pay | Admitting: Gastroenterology

## 2019-01-20 ENCOUNTER — Ambulatory Visit: Payer: BC Managed Care – PPO | Admitting: Anesthesiology

## 2019-01-20 ENCOUNTER — Encounter
Admission: RE | Disposition: A | Discharge status: Home / Self Care | Payer: Self-pay | Source: Ambulatory Visit | Attending: Gastroenterology

## 2019-01-20 ENCOUNTER — Encounter: Payer: Self-pay | Admitting: Anesthesiology

## 2019-01-20 ENCOUNTER — Other Ambulatory Visit: Payer: Self-pay

## 2019-01-20 DIAGNOSIS — Z803 Family history of malignant neoplasm of breast: Secondary | ICD-10-CM | POA: Insufficient documentation

## 2019-01-20 DIAGNOSIS — K922 Gastrointestinal hemorrhage, unspecified: Secondary | ICD-10-CM

## 2019-01-20 DIAGNOSIS — Z9013 Acquired absence of bilateral breasts and nipples: Secondary | ICD-10-CM | POA: Insufficient documentation

## 2019-01-20 DIAGNOSIS — Z79810 Long term (current) use of selective estrogen receptor modulators (SERMs): Secondary | ICD-10-CM | POA: Diagnosis not present

## 2019-01-20 DIAGNOSIS — Z888 Allergy status to other drugs, medicaments and biological substances status: Secondary | ICD-10-CM | POA: Diagnosis not present

## 2019-01-20 DIAGNOSIS — Z9221 Personal history of antineoplastic chemotherapy: Secondary | ICD-10-CM | POA: Insufficient documentation

## 2019-01-20 DIAGNOSIS — K921 Melena: Secondary | ICD-10-CM | POA: Diagnosis not present

## 2019-01-20 DIAGNOSIS — Z170 Estrogen receptor positive status [ER+]: Secondary | ICD-10-CM | POA: Insufficient documentation

## 2019-01-20 DIAGNOSIS — D500 Iron deficiency anemia secondary to blood loss (chronic): Secondary | ICD-10-CM | POA: Diagnosis not present

## 2019-01-20 DIAGNOSIS — Z79899 Other long term (current) drug therapy: Secondary | ICD-10-CM | POA: Diagnosis not present

## 2019-01-20 DIAGNOSIS — C50912 Malignant neoplasm of unspecified site of left female breast: Secondary | ICD-10-CM | POA: Insufficient documentation

## 2019-01-20 DIAGNOSIS — D508 Other iron deficiency anemias: Secondary | ICD-10-CM

## 2019-01-20 DIAGNOSIS — Z923 Personal history of irradiation: Secondary | ICD-10-CM | POA: Diagnosis not present

## 2019-01-20 HISTORY — PX: ESOPHAGOGASTRODUODENOSCOPY (EGD) WITH PROPOFOL: SHX5813

## 2019-01-20 LAB — POCT PREGNANCY, URINE: Preg Test, Ur: NEGATIVE

## 2019-01-20 SURGERY — ESOPHAGOGASTRODUODENOSCOPY (EGD) WITH PROPOFOL
Anesthesia: General

## 2019-01-20 MED ORDER — PROPOFOL 500 MG/50ML IV EMUL: INTRAVENOUS | Status: DC | PRN

## 2019-01-20 MED ORDER — MIDAZOLAM HCL 2 MG/2ML IJ SOLN
INTRAMUSCULAR | Status: DC | PRN
  Administered 2019-01-20: 2 mg via INTRAVENOUS

## 2019-01-20 MED ORDER — SODIUM CHLORIDE 0.9 % IV SOLN
INTRAVENOUS | Status: DC
  Administered 2019-01-20: 11:00:00 via INTRAVENOUS

## 2019-01-20 MED ORDER — HEPARIN SOD (PORK) LOCK FLUSH 100 UNIT/ML IV SOLN
INTRAVENOUS | Status: AC
  Administered 2019-01-20: 100 [IU]
  Filled 2019-01-20: qty 5

## 2019-01-20 MED ORDER — PROPOFOL 10 MG/ML IV BOLUS
INTRAVENOUS | Status: DC | PRN
  Administered 2019-01-20: 20 mg via INTRAVENOUS
  Administered 2019-01-20: 90 mg via INTRAVENOUS

## 2019-01-20 MED ORDER — MIDAZOLAM HCL 2 MG/2ML IJ SOLN
INTRAMUSCULAR | Status: AC
  Filled 2019-01-20: qty 2

## 2019-01-20 MED ORDER — FUSION PLUS PO CAPS: 1.0000 | ORAL_CAPSULE | Freq: Every day | ORAL | 1 refills | Status: AC

## 2019-01-20 MED ORDER — LIDOCAINE HCL (PF) 1 % IJ SOLN
INTRAMUSCULAR | Status: AC
  Filled 2019-01-20: qty 2

## 2019-01-20 NOTE — Anesthesia Preprocedure Evaluation (Signed)
Anesthesia Evaluation  Patient identified by MRN, date of birth, ID band Patient awake    Reviewed: Allergy & Precautions, H&P , NPO status , Patient's Chart, lab work & pertinent test results  History of Anesthesia Complications Negative for: history of anesthetic complications  Airway Mallampati: II  TM Distance: >3 FB Neck ROM: full    Dental no notable dental hx. (+) Teeth Intact   Pulmonary neg pulmonary ROS, neg shortness of breath,    Pulmonary exam normal breath sounds clear to auscultation       Cardiovascular Exercise Tolerance: Good (-) Past MI negative cardio ROS Normal cardiovascular exam Rhythm:regular Rate:Normal     Neuro/Psych  Headaches, negative psych ROS   GI/Hepatic negative GI ROS, Neg liver ROS, neg GERD  ,  Endo/Other  negative endocrine ROS  Renal/GU negative Renal ROS  negative genitourinary   Musculoskeletal   Abdominal   Peds  Hematology negative hematology ROS (+)   Anesthesia Other Findings History of breast cancer.  Past Medical History: No date: Abnormal breast biopsy     Comment:  PASH, [name] No date: Family history of breast cancer No date: Family history of ovarian cancer 2000, 2016: Fibroadenoma of breast, left 08/26/2013: Genetic screening     Comment:  BRCA/BART negative/Myriad, CHEK2 POS 2017 2017: Increased risk of breast cancer     Comment:  IBIS=47% 06/18/2017: Left breast lump     Comment:  Fluid/drained [name] No date: Migraine 6294: Monoallelic mutation of CHEK2 gene in female patient     Comment:  increased risk of breast and colon cancer   Reproductive/Obstetrics negative OB ROS                             Anesthesia Physical  Anesthesia Plan  ASA: II  Anesthesia Plan: General   Post-op Pain Management:    Induction: Intravenous  PONV Risk Score and Plan: Propofol infusion  Airway Management Planned: Nasal  Cannula  Additional Equipment:   Intra-op Plan:   Post-operative Plan:   Informed Consent: I have reviewed the patients History and Physical, chart, labs and discussed the procedure including the risks, benefits and alternatives for the proposed anesthesia with the patient or authorized representative who has indicated his/her understanding and acceptance.     Dental Advisory Given  Plan Discussed with: Anesthesiologist, CRNA and Surgeon  Anesthesia Plan Comments:         Anesthesia Quick Evaluation

## 2019-01-20 NOTE — H&P (Signed)
Cephas Darby, MD [address]  [place]  [place], [place] [address]  Main: [phone]  Fax: [phone] Pager: [phone]  Primary Care Physician:  [name], PA-C Primary Gastroenterologist:  Dr. Cephas Darby  Pre-Procedure History & Physical: HPI:  Denise [name] is a 43 y.o. female is here for an endoscopy.   Past Medical History:  Diagnosis Date  . Abnormal breast biopsy    PASH, [name]  . Breast cancer ([place]) 11/2017   left breast; ER/PR/Her2neu POS  . Family history of breast cancer   . Family history of ovarian cancer   . Fibroadenoma of breast, left 2000, 2016  . Genetic screening 08/26/2013   BRCA/BART negative/Myriad, CHEK2 POS 2017  . Increased risk of breast cancer 2017   IBIS=47%  . Left breast lump 06/18/2017   Fluid/drained [name]  . Migraine   . Monoallelic mutation of CHEK2 gene in female patient 2017   increased risk of breast and colon cancer  . Personal history of chemotherapy   . Personal history of radiation therapy     Past Surgical History:  Procedure Laterality Date  . BREAST BIOPSY Bilateral 09/08/14   neg  . BREAST BIOPSY Left 03/16/2015   Procedure: BREAST BIOPSY WITH NEEDLE LOCALIZATION;  Surgeon: [name], MD;  Location: [facility] ORS;  Service: General;  Laterality: Left;  . BREAST BIOPSY Left 11/2017   positive  . BREAST CYST ASPIRATION Left 06/18/2017   cyst aspiration  . BREAST EXCISIONAL BIOPSY Left 09/2014  . BREAST EXCISIONAL BIOPSY Right    age 46's  . BREAST RECONSTRUCTION WITH PLACEMENT OF TISSUE EXPANDER AND FLEX HD (ACELLULAR HYDRATED DERMIS) Left 05/20/2018   Procedure: BREAST RECONSTRUCTION WITH PLACEMENT OF TISSUE EXPANDER AND FLEX HD (ACELLULAR HYDRATED DERMIS);  Surgeon: [name], DO;  Location: [facility] ORS;  Service: Plastics;  Laterality: Left;  . BREAST RECONSTRUCTION WITH PLACEMENT OF TISSUE EXPANDER AND FLEX HD (ACELLULAR HYDRATED DERMIS) Right 12/09/2018   Procedure:  BREAST RECONSTRUCTION WITH PLACEMENT OF TISSUE EXPANDER AND FLEX HD (ACELLULAR HYDRATED DERMIS);  Surgeon: [name], DO;  Location: [facility];  Service: Plastics;  Laterality: Right;  . BREAST SURGERY Right March 2000   benign fibroadenoma  . BREAST SURGERY Left 08/08/13   excision  . BREAST SURGERY Left 09/23/14   excision  . MASTECTOMY Left 05/20/2018  . MASTECTOMY W/ SENTINEL NODE BIOPSY Left 05/20/2018   Procedure: MASTECTOMY WITH SENTINEL LYMPH NODE BIOPSY;  Surgeon: [name], MD;  Location: [facility];  Service: General;  Laterality: Left;  . PORTACATH PLACEMENT Right 12/17/2017   Procedure: INSERTION PORT-A-CATH;  Surgeon: [name], MD;  Location: [facility];  Service: General;  Laterality: Right;  . SIMPLE MASTECTOMY WITH AXILLARY SENTINEL NODE BIOPSY Right 12/09/2018   Procedure: SIMPLE MASTECTOMY RIGHT;  Surgeon: [name], MD;  Location: [facility];  Service: General;  Laterality: Right;    Prior to Admission medications   Medication Sig Start Date End Date Taking? Authorizing Provider  acetaminophen (TYLENOL) 500 MG tablet Take 500 mg by mouth every 6 (six) hours as needed for moderate pain or headache.   Yes [provider]  diazepam (VALIUM) 2 MG tablet Take 1 tablet (2 mg total) by mouth every 6 (six) hours as needed for anxiety. 09/03/18  Yes Dillingham, [name], DO  doxycycline (VIBRA-TABS) 100 MG tablet Take 1 tablet (100 mg total) by mouth every 12 (twelve) hours. 01/09/19  Yes Fritzi Mandes, MD  HYDROcodone-acetaminophen (NORCO) 5-325 MG tablet Take 1 tablet by mouth every 8 (eight) hours as needed for moderate pain. 01/09/19  Yes Fritzi Mandes, MD  lidocaine-prilocaine (EMLA) cream Apply 1 application topically as needed (port access).   Yes [provider]  magnesium oxide (MAG-OX) 400 MG tablet Take 1 tablet (400 mg total) by mouth daily. 07/11/18  Yes Karen Kitchens, NP  metoprolol succinate (TOPROL-XL) 25 MG 24 hr tablet Take 1  tablet (25 mg total) by mouth daily. 01/09/19  Yes Fritzi Mandes, MD  omeprazole (PRILOSEC OTC) 20 MG tablet Take 20 mg by mouth daily as needed (for acid reflux).   Yes [provider]  tamoxifen (NOLVADEX) 20 MG tablet Take 1 tablet (20 mg total) by mouth daily. 01/02/19  Yes Corcoran, Drue Second, MD  prochlorperazine (COMPAZINE) 10 MG tablet Take 1 tablet (10 mg total) by mouth every 6 (six) hours as needed (Nausea or vomiting). Patient not taking: Reported on 05/06/2018 12/19/17 05/07/18  Lequita Asal, MD    Allergies as of 01/17/2019 - Review Complete 01/15/2019  Allergen Reaction Noted  . Steri-strip compound benzoin [benzoin compound]  05/09/2018  . Tape Other (See Comments) 12/30/2017    Family History  Problem Relation Age of Onset  . Prostate cancer Father 60       requiring tx/hormones  . Breast cancer Paternal Grandmother 101  . Breast cancer Maternal Aunt 60  . Ovarian cancer Maternal Aunt 70  . Breast cancer Paternal Aunt 44  . Pancreatic cancer Maternal Aunt 75    Social History   Socioeconomic History  . Marital status: Married    Spouse name: Not on file  . Number of children: Not on file  . Years of education: Not on file  . Highest education level: Not on file  Occupational History  . Not on file  Social Needs  . Financial resource strain: Not very hard  . Food insecurity    Worry: Never true    Inability: Never true  . Transportation needs    Medical: No    Non-medical: Not on file  Tobacco Use  . Smoking status: Never Smoker  . Smokeless tobacco: Never Used  Substance and Sexual Activity  . Alcohol use: Yes    Comment: occasionally  . Drug use: No  . Sexual activity: Yes    Birth control/protection: Condom  Lifestyle  . Physical activity    Days per week: 3 days    Minutes per session: 60 min  . Stress: Only a little  Relationships  . Social Herbalist on phone: Twice a week    Gets together: Once a week    Attends  religious service: More than 4 times per year    Active member of club or organization: No    Attends meetings of clubs or organizations: Never    Relationship status: Married  . Intimate partner violence    Fear of current or ex partner: No    Emotionally abused: No    Physically abused: No    Forced sexual activity: No  Other Topics Concern  . Not on file  Social History Narrative  . Not on file    Review of Systems: See HPI, otherwise negative ROS  Physical Exam: BP 131/90   Pulse 93   Temp (!) 96.5 F (35.8 C) (Tympanic)   Ht _0  (1.702 m)   Wt 82.1 kg   SpO2 100%   BMI 28.35 kg/m  General:   Alert,  pleasant and cooperative in NAD Head:  Normocephalic and atraumatic. Neck:  Supple; no masses or thyromegaly. Lungs:  Clear throughout to auscultation.    Heart:  Regular rate and rhythm. Abdomen:  Soft, nontender and nondistended. Normal bowel sounds, without guarding, and without rebound.   Neurologic:  Alert and  oriented x4;  grossly normal neurologically.  Impression/Plan: Denise [name] is here for an endoscopy to be performed for melena  Risks, benefits, limitations, and alternatives regarding  endoscopy have been reviewed with the patient.  Questions have been answered.  All parties agreeable.   Sherri Sear, MD  01/20/2019, 11:44 AM

## 2019-01-20 NOTE — Anesthesia Post-op Follow-up Note (Signed)
Anesthesia QCDR form completed.        

## 2019-01-20 NOTE — Progress Notes (Signed)
Patient ID: Denise [name], female    DOB: 1976-05-03, 43 y.o.   MRN: [id number]   Chief Complaint  Patient presents with  . Follow-up    2 weeks (R) mastectomy    HPI Denise [name] is a 43 y.o. female presenting for follow-up on her right breast surgery, she currently has an expander in her right breast.  Per charts, she was recently in the ED/Hospital for epistaxis. She also had an endoscopy yesterday for melena - exam was normal and she was d/c'ed to home.  Today she is feeling a lot better. She has no complaints or concerns.   Review of Systems  Constitutional: Negative.   HENT: Negative.   Respiratory: Negative.   Cardiovascular: Negative.   Gastrointestinal: Negative.   Musculoskeletal: Negative.   Neurological: Negative for dizziness, syncope, weakness and headaches.  Psychiatric/Behavioral: Negative.      Objective:   Vitals:   01/21/19 0841  BP: 121/75  Pulse: 99  Temp: 98 F (36.7 C)  SpO2: 98%   Chaperone present. Vitals and nursing notes reviewed. Physical Exam Constitutional:      General: She is not in acute distress.    Appearance: Normal appearance. She is normal weight. She is not ill-appearing.  HENT:     Head: Normocephalic and atraumatic.  Neck:     Musculoskeletal: Normal range of motion.  Cardiovascular:     Rate and Rhythm: Normal rate.  Pulmonary:     Effort: Pulmonary effort is normal.  Chest:    Abdominal:     General: Abdomen is flat.  Musculoskeletal: Normal range of motion.  Skin:    General: Skin is warm and dry.  Neurological:     General: No focal deficit present.     Mental Status: She is alert and oriented to person, place, and time. Mental status is at baseline.      Assessment & Plan:     ICD-10-CM   1. S/P breast reconstruction  Z98.82   2. 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 Acquired absence of left breast  Z90.12     Denise [name] is doing very well. Her incisions are healing nicely.  No sign of infection. She is almost at her desired size of the right to match the left.  We placed injectable saline in the Expander using a sterile technique: Right: 50 cc for a total of 350 / 375 cc Left:  450 / 375 cc  Return in about 2 weeks (around 02/04/2019).  Kermit Balo Nekhi Liwanag, PA-C

## 2019-01-20 NOTE — Anesthesia Postprocedure Evaluation (Signed)
Anesthesia Post Note  Patient: Denise [name]  Procedure(s) Performed: ESOPHAGOGASTRODUODENOSCOPY (EGD) WITH PROPOFOL (N/A )  Patient location during evaluation: Endoscopy Anesthesia Type: General Level of consciousness: awake and alert and oriented Pain management: pain level controlled Vital Signs Assessment: post-procedure vital signs reviewed and stable Respiratory status: spontaneous breathing Cardiovascular status: blood pressure returned to baseline Anesthetic complications: no     Last Vitals:  Vitals:   01/20/19 1250 01/20/19 1251  BP: (!) 98/55 (!) 98/55  Pulse: 91   Resp: 17 16  Temp: (!) 36.1 C   SpO2: 97% 99%    Last Pain:  Vitals:   01/20/19 1250  TempSrc: Tympanic  PainSc: 0-No pain                 Shacoya Burkhammer

## 2019-01-20 NOTE — Transfer of Care (Signed)
Immediate Anesthesia Transfer of Care Note  Patient: Denise [name]  Procedure(s) Performed: ESOPHAGOGASTRODUODENOSCOPY (EGD) WITH PROPOFOL (N/A )  Patient Location: Endoscopy Unit  Anesthesia Type:General  Level of Consciousness: drowsy and patient cooperative  Airway & Oxygen Therapy: Patient Spontanous Breathing  Post-op Assessment: Report given to RN and Post -op Vital signs reviewed and stable  Post vital signs: Reviewed and stable  Last Vitals:  Vitals Value Taken Time  BP 98/55 01/20/19 1251  Temp    Pulse 99 01/20/19 1252  Resp 29 01/20/19 1252  SpO2 96 % 01/20/19 1252  Vitals shown include unvalidated device data.  Last Pain:  Vitals:   01/20/19 1250  TempSrc: (P) Tympanic  PainSc:          Complications: No apparent anesthesia complications

## 2019-01-20 NOTE — Op Note (Signed)
[facility] Gastroenterology Patient Name: Denise [name] Procedure Date: 01/20/2019 12:22 PM MRN: [id number] Account #: [id number] Date of Birth: 1975/12/05 Admit Type: Outpatient Age: 43 Room: [facility] ENDO ROOM 2 Gender: Female Note Status: Finalized Procedure:            Upper GI endoscopy Indications:          Iron deficiency anemia secondary to chronic blood loss,                        , Melena 2-3 days ago, no NSAID use Providers:            [name] MD, MD Referring MD:         Drue Second. Mike Gip (Referring MD) Medicines:            Monitored Anesthesia Care Complications:        No immediate complications. Estimated blood loss: None. Procedure:            Pre-Anesthesia Assessment:                       - Prior to the procedure, a History and Physical was                        performed, and patient medications and allergies were                        reviewed. The patient is competent. The risks and                        benefits of the procedure and the sedation options and                        risks were discussed with the patient. All questions                        were answered and informed consent was obtained.                        Patient identification and proposed procedure were                        verified by the physician, the nurse, the                        anesthesiologist, the anesthetist and the technician in                        the pre-procedure area in the procedure room in the                        endoscopy suite. Mental Status Examination: alert and                        oriented. Airway Examination: normal oropharyngeal                        airway and neck mobility. Respiratory Examination:                        clear to auscultation. CV Examination:  normal.                        Prophylactic Antibiotics: The patient does not require                        prophylactic antibiotics. Prior Anticoagulants:  The                        patient has taken no previous anticoagulant or                        antiplatelet agents. ASA Grade Assessment: II - A                        patient with mild systemic disease. After reviewing the                        risks and benefits, the patient was deemed in                        satisfactory condition to undergo the procedure. The                        anesthesia plan was to use monitored anesthesia care                        (MAC). Immediately prior to administration of                        medications, the patient was re-assessed for adequacy                        to receive sedatives. The heart rate, respiratory rate,                        oxygen saturations, blood pressure, adequacy of                        pulmonary ventilation, and response to care were                        monitored throughout the procedure. The physical status                        of the patient was re-assessed after the procedure.                       After obtaining informed consent, the endoscope was                        passed under direct vision. Throughout the procedure,                        the patient's blood pressure, pulse, and oxygen                        saturations were monitored continuously. The Endoscope                        was introduced through the mouth,  and advanced to the                        second part of duodenum. The upper GI endoscopy was                        accomplished without difficulty. The patient tolerated                        the procedure well. Findings:      The duodenal bulb and second portion of the duodenum were normal.      The entire examined stomach was normal.      The cardia and gastric fundus were normal on retroflexion.      The gastroesophageal junction and examined esophagus were normal. Impression:           - Normal duodenal bulb and second portion of the                        duodenum.                        - Normal stomach.                       - Normal gastroesophageal junction and esophagus.                       - No specimens collected. Recommendation:       - Discharge patient to home (with escort).                       - Resume previous diet today.                       - Continue present medications.                       - Recommend colonoscopy if anemia not responding to                        iron therapy Procedure Code(s):    --- Professional ---                       [phone], Esophagogastroduodenoscopy, flexible, transoral;                        diagnostic, including collection of specimen(s) by                        brushing or washing, when performed (separate procedure) Diagnosis Code(s):    --- Professional ---                       D50.0, Iron deficiency anemia secondary to blood loss                        (chronic)                       K92.1, Melena (includes Hematochezia) CPT copyright 2019 American Medical Association. All rights reserved. The codes documented in this report are preliminary and upon coder review may  be revised to meet  current compliance requirements. Dr. Ulyess Mort [name] MD, MD 01/20/2019 12:45:05 PM This report has been signed electronically. Number of Addenda: 0 Note Initiated On: 01/20/2019 12:22 PM Estimated Blood Loss: Estimated blood loss: none.      [facility]

## 2019-01-21 ENCOUNTER — Encounter: Payer: Self-pay | Admitting: Surgical

## 2019-01-21 ENCOUNTER — Telehealth: Payer: Self-pay

## 2019-01-21 ENCOUNTER — Ambulatory Visit (INDEPENDENT_AMBULATORY_CARE_PROVIDER_SITE_OTHER): Payer: BC Managed Care – PPO | Admitting: Surgical

## 2019-01-21 ENCOUNTER — Encounter: Payer: Self-pay | Admitting: Hematology and Oncology

## 2019-01-21 VITALS — BP 121/75 | HR 99 | Temp 98.0°F | Ht 67.0 in | Wt 186.2 lb

## 2019-01-21 DIAGNOSIS — Z9011 Acquired absence of right breast and nipple: Secondary | ICD-10-CM

## 2019-01-21 DIAGNOSIS — Z9012 Acquired absence of left breast and nipple: Secondary | ICD-10-CM

## 2019-01-21 DIAGNOSIS — Z9882 Breast implant status: Secondary | ICD-10-CM

## 2019-01-21 DIAGNOSIS — Z9889 Other specified postprocedural states: Secondary | ICD-10-CM

## 2019-01-21 NOTE — Telephone Encounter (Signed)
Spoke with the patient to inform her that her EGD looked good, per Dr Mike Gip and she has been seen by the ENT on 01/13/2019 and no she has not had any moe melena( black tary stools). The patient was understanding. The has agreed to get treatment anytime . I have sent her to Shirlean Mylar to get the patient schedule Herceptin.

## 2019-01-23 ENCOUNTER — Other Ambulatory Visit: Payer: Self-pay

## 2019-01-24 ENCOUNTER — Inpatient Hospital Stay: Payer: BC Managed Care – PPO

## 2019-01-24 ENCOUNTER — Inpatient Hospital Stay: Payer: BC Managed Care – PPO | Attending: Hematology and Oncology

## 2019-01-24 ENCOUNTER — Other Ambulatory Visit: Payer: Self-pay | Admitting: Hematology and Oncology

## 2019-01-24 VITALS — BP 106/67 | HR 76 | Temp 98.5°F | Resp 16

## 2019-01-24 DIAGNOSIS — Z170 Estrogen receptor positive status [ER+]: Secondary | ICD-10-CM | POA: Insufficient documentation

## 2019-01-24 DIAGNOSIS — C773 Secondary and unspecified malignant neoplasm of axilla and upper limb lymph nodes: Secondary | ICD-10-CM | POA: Diagnosis not present

## 2019-01-24 DIAGNOSIS — Z5112 Encounter for antineoplastic immunotherapy: Secondary | ICD-10-CM

## 2019-01-24 DIAGNOSIS — C50412 Malignant neoplasm of upper-outer quadrant of left female breast: Secondary | ICD-10-CM

## 2019-01-24 DIAGNOSIS — D500 Iron deficiency anemia secondary to blood loss (chronic): Secondary | ICD-10-CM

## 2019-01-24 DIAGNOSIS — C50419 Malignant neoplasm of upper-outer quadrant of unspecified female breast: Secondary | ICD-10-CM

## 2019-01-24 LAB — COMPREHENSIVE METABOLIC PANEL
ALT: 16 U/L (ref 0–44)
AST: 21 U/L (ref 15–41)
Albumin: 3.6 g/dL (ref 3.5–5.0)
Alkaline Phosphatase: 56 U/L (ref 38–126)
Anion gap: 7 (ref 5–15)
BUN: 14 mg/dL (ref 6–20)
CO2: 23 mmol/L (ref 22–32)
Calcium: 8.6 mg/dL — ABNORMAL LOW (ref 8.9–10.3)
Chloride: 106 mmol/L (ref 98–111)
Creatinine, Ser: 0.64 mg/dL (ref 0.44–1.00)
GFR calc Af Amer: >60 mL/min (ref >60)
GFR calc non Af Amer: >60 mL/min (ref >60)
Glucose, Bld: 167 mg/dL — ABNORMAL HIGH (ref 70–99)
Potassium: 3.5 mmol/L (ref 3.5–5.1)
Sodium: 136 mmol/L (ref 135–145)
Total Bilirubin: 0.5 mg/dL (ref 0.3–1.2)
Total Protein: 6.6 g/dL (ref 6.5–8.1)

## 2019-01-24 LAB — FERRITIN: Ferritin: 21 ng/mL (ref 11–307)

## 2019-01-24 LAB — CBC WITH DIFFERENTIAL/PLATELET
Abs Immature Granulocytes: 0.01 10*3/uL (ref 0.00–0.07)
Basophils Absolute: 0 10*3/uL (ref 0.0–0.1)
Basophils Relative: 0 %
Eosinophils Absolute: 0.2 10*3/uL (ref 0.0–0.5)
Eosinophils Relative: 3 %
HCT: 28.3 % — ABNORMAL LOW (ref 36.0–46.0)
Hemoglobin: 9.1 g/dL — ABNORMAL LOW (ref 12.0–15.0)
Immature Granulocytes: 0 %
Lymphocytes Relative: 27 %
Lymphs Abs: 1.3 10*3/uL (ref 0.7–4.0)
MCH: 28.1 pg (ref 26.0–34.0)
MCHC: 32.2 g/dL (ref 30.0–36.0)
MCV: 87.3 fL (ref 80.0–100.0)
Monocytes Absolute: 0.3 10*3/uL (ref 0.1–1.0)
Monocytes Relative: 6 %
Neutro Abs: 3 10*3/uL (ref 1.7–7.7)
Neutrophils Relative %: 64 %
Platelets: 240 10*3/uL (ref 150–400)
RBC: 3.24 MIL/uL — ABNORMAL LOW (ref 3.87–5.11)
RDW: 14.8 % (ref 11.5–15.5)
WBC: 4.8 10*3/uL (ref 4.0–10.5)
nRBC: 0 % (ref 0.0–0.2)

## 2019-01-24 LAB — RETICULOCYTES
Immature Retic Fract: 22.1 % — ABNORMAL HIGH (ref 2.3–15.9)
RBC.: 3.28 MIL/uL — ABNORMAL LOW (ref 3.87–5.11)
Retic Count, Absolute: 148.3 10*3/uL (ref 19.0–186.0)
Retic Ct Pct: 4.5 % — ABNORMAL HIGH (ref 0.4–3.1)

## 2019-01-24 LAB — MAGNESIUM: Magnesium: 1.7 mg/dL (ref 1.7–2.4)

## 2019-01-24 MED ORDER — DIPHENHYDRAMINE HCL 25 MG PO CAPS
50.0000 mg | ORAL_CAPSULE | Freq: Once | ORAL | Status: AC
  Administered 2019-01-24: 25 mg via ORAL
  Filled 2019-01-24: qty 2

## 2019-01-24 MED ORDER — ACETAMINOPHEN 325 MG PO TABS
650.0000 mg | ORAL_TABLET | Freq: Once | ORAL | Status: AC
  Administered 2019-01-24: 650 mg via ORAL
  Filled 2019-01-24: qty 2

## 2019-01-24 MED ORDER — TRASTUZUMAB-ANNS CHEMO 150 MG IV SOLR
500.0000 mg | Freq: Once | INTRAVENOUS | Status: AC
  Administered 2019-01-24: 500 mg via INTRAVENOUS
  Filled 2019-01-24: qty 23.81

## 2019-01-24 MED ORDER — HEPARIN SOD (PORK) LOCK FLUSH 100 UNIT/ML IV SOLN
500.0000 [IU] | Freq: Once | INTRAVENOUS | Status: AC
  Administered 2019-01-24: 500 [IU] via INTRAVENOUS
  Filled 2019-01-24: qty 5

## 2019-01-24 MED ORDER — SODIUM CHLORIDE 0.9 % IV SOLN
Freq: Once | INTRAVENOUS | Status: AC
  Administered 2019-01-24: 11:00:00 via INTRAVENOUS
  Filled 2019-01-24: qty 250

## 2019-01-24 MED ORDER — SODIUM CHLORIDE 0.9% FLUSH
10.0000 mL | INTRAVENOUS | Status: DC | PRN
  Administered 2019-01-24: 10 mL via INTRAVENOUS
  Filled 2019-01-24: qty 10

## 2019-01-24 NOTE — Patient Instructions (Signed)
Trastuzumab injection for infusion What is this medicine? TRASTUZUMAB (tras TOO zoo mab) is a monoclonal antibody. It is used to treat breast cancer and stomach cancer. This medicine may be used for other purposes; ask your health care provider or pharmacist if you have questions. COMMON BRAND NAME(S): Herceptin, Herzuma, KANJINTI, Ogivri, Ontruzant, Trazimera What should I tell my health care provider before I take this medicine? They need to know if you have any of these conditions:  heart disease  heart failure  lung or breathing disease, like asthma  an unusual or allergic reaction to trastuzumab, benzyl alcohol, or other medications, foods, dyes, or preservatives  pregnant or trying to get pregnant  breast-feeding How should I use this medicine? This drug is given as an infusion into a vein. It is administered in a hospital or clinic by a specially trained health care professional. Talk to your pediatrician regarding the use of this medicine in children. This medicine is not approved for use in children. Overdosage: If you think you have taken too much of this medicine contact a poison control center or emergency room at once. NOTE: This medicine is only for you. Do not share this medicine with others. What if I miss a dose? It is important not to miss a dose. Call your doctor or health care professional if you are unable to keep an appointment. What may interact with this medicine? This medicine may interact with the following medications:  certain types of chemotherapy, such as daunorubicin, doxorubicin, epirubicin, and idarubicin This list may not describe all possible interactions. Give your health care provider a list of all the medicines, herbs, non-prescription drugs, or dietary supplements you use. Also tell them if you smoke, drink alcohol, or use illegal drugs. Some items may interact with your medicine. What should I watch for while using this medicine? Visit your  doctor for checks on your progress. Report any side effects. Continue your course of treatment even though you feel ill unless your doctor tells you to stop. Call your doctor or health care professional for advice if you get a fever, chills or sore throat, or other symptoms of a cold or flu. Do not treat yourself. Try to avoid being around people who are sick. You may experience fever, chills and shaking during your first infusion. These effects are usually mild and can be treated with other medicines. Report any side effects during the infusion to your health care professional. Fever and chills usually do not happen with later infusions. Do not become pregnant while taking this medicine or for 7 months after stopping it. Women should inform their doctor if they wish to become pregnant or think they might be pregnant. Women of child-bearing potential will need to have a negative pregnancy test before starting this medicine. There is a potential for serious side effects to an unborn child. Talk to your health care professional or pharmacist for more information. Do not breast-feed an infant while taking this medicine or for 7 months after stopping it. Women must use effective birth control with this medicine. What side effects may I notice from receiving this medicine? Side effects that you should report to your doctor or health care professional as soon as possible:  allergic reactions like skin rash, itching or hives, swelling of the face, lips, or tongue  chest pain or palpitations  cough  dizziness  feeling faint or lightheaded, falls  fever  general ill feeling or flu-like symptoms  signs of worsening heart failure like   breathing problems; swelling in your legs and feet  unusually weak or tired Side effects that usually do not require medical attention (report to your doctor or health care professional if they continue or are bothersome):  bone pain  changes in  taste  diarrhea  joint pain  nausea/vomiting  weight loss This list may not describe all possible side effects. Call your doctor for medical advice about side effects. You may report side effects to FDA at 1-800-FDA-1088. Where should I keep my medicine? This drug is given in a hospital or clinic and will not be stored at home. NOTE: This sheet is a summary. It may not cover all possible information. If you have questions about this medicine, talk to your doctor, pharmacist, or health care provider.  2020 Elsevier/Gold Standard (2016-05-30 14:37:52)  

## 2019-01-28 ENCOUNTER — Ambulatory Visit: Payer: BC Managed Care – PPO | Admitting: Physical Therapy

## 2019-01-28 ENCOUNTER — Other Ambulatory Visit: Payer: Self-pay

## 2019-01-29 ENCOUNTER — Ambulatory Visit: Payer: BC Managed Care – PPO | Attending: Plastic Surgery | Admitting: Physical Therapy

## 2019-01-29 DIAGNOSIS — M25511 Pain in right shoulder: Secondary | ICD-10-CM | POA: Diagnosis present

## 2019-01-29 DIAGNOSIS — M25512 Pain in left shoulder: Secondary | ICD-10-CM | POA: Insufficient documentation

## 2019-01-29 NOTE — Therapy (Signed)
[place] [facility] [address] [place], [address] Phone: [phone]   Fax:  [phone]  Physical Therapy Treatment  Patient Details  Name: Denise [name] MRN: [id number] Date of Birth: 09-Mar-1976 Referring Provider (PT): Dillingham   Encounter Date: 01/29/2019  PT End of Session - 01/29/19 1637    Visit Number  16    Number of Visits  33    Date for PT Re-Evaluation  02/21/19    PT Start Time  0415    PT Stop Time  0500    PT Time Calculation (min)  45 min    Activity Tolerance  Patient tolerated treatment well    Behavior During Therapy  [facility] for tasks assessed/performed       Past Medical History:  Diagnosis Date  . Abnormal breast biopsy    PASH, [name]  . Breast cancer ([place]) 11/2017   left breast; ER/PR/Her2neu POS  . Family history of breast cancer   . Family history of ovarian cancer   . Fibroadenoma of breast, left 2000, 2016  . Genetic screening 08/26/2013   BRCA/BART negative/Myriad, CHEK2 POS 2017  . Increased risk of breast cancer 2017   IBIS=47%  . Left breast lump 06/18/2017   Fluid/drained [name]  . Migraine   . Monoallelic mutation of CHEK2 gene in female patient 2017   increased risk of breast and colon cancer  . Personal history of chemotherapy   . Personal history of radiation therapy     Past Surgical History:  Procedure Laterality Date  . BREAST BIOPSY Bilateral 09/08/14   neg  . BREAST BIOPSY Left 03/16/2015   Procedure: BREAST BIOPSY WITH NEEDLE LOCALIZATION;  Surgeon: [name], MD;  Location: [facility] ORS;  Service: General;  Laterality: Left;  . BREAST BIOPSY Left 11/2017   positive  . BREAST CYST ASPIRATION Left 06/18/2017   cyst aspiration  . BREAST EXCISIONAL BIOPSY Left 09/2014  . BREAST EXCISIONAL BIOPSY Right    age 71's  . BREAST RECONSTRUCTION WITH PLACEMENT OF TISSUE EXPANDER AND FLEX HD (ACELLULAR HYDRATED DERMIS) Left 05/20/2018   Procedure:  BREAST RECONSTRUCTION WITH PLACEMENT OF TISSUE EXPANDER AND FLEX HD (ACELLULAR HYDRATED DERMIS);  Surgeon: [name], DO;  Location: [facility] ORS;  Service: Plastics;  Laterality: Left;  . BREAST RECONSTRUCTION WITH PLACEMENT OF TISSUE EXPANDER AND FLEX HD (ACELLULAR HYDRATED DERMIS) Right 12/09/2018   Procedure: BREAST RECONSTRUCTION WITH PLACEMENT OF TISSUE EXPANDER AND FLEX HD (ACELLULAR HYDRATED DERMIS);  Surgeon: [name], DO;  Location: [facility] ORS;  Service: Plastics;  Laterality: Right;  . BREAST SURGERY Right March 2000   benign fibroadenoma  . BREAST SURGERY Left 08/08/13   excision  . BREAST SURGERY Left 09/23/14   excision  . ESOPHAGOGASTRODUODENOSCOPY (EGD) WITH PROPOFOL N/A 01/20/2019   Procedure: ESOPHAGOGASTRODUODENOSCOPY (EGD) WITH PROPOFOL;  Surgeon: [name], MD;  Location: [place];  Service: Gastroenterology;  Laterality: N/A;  . MASTECTOMY Left 05/20/2018  . MASTECTOMY W/ SENTINEL NODE BIOPSY Left 05/20/2018   Procedure: MASTECTOMY WITH SENTINEL LYMPH NODE BIOPSY;  Surgeon: [name], MD;  Location: [facility];  Service: General;  Laterality: Left;  . PORTACATH PLACEMENT Right 12/17/2017   Procedure: INSERTION PORT-A-CATH;  Surgeon: [name], MD;  Location: [facility];  Service: General;  Laterality: Right;  . SIMPLE MASTECTOMY WITH AXILLARY SENTINEL NODE BIOPSY Right 12/09/2018   Procedure: SIMPLE MASTECTOMY RIGHT;  Surgeon: [name], MD;  Location: [facility]  ORS;  Service: General;  Laterality: Right;    There were no vitals filed for this visit.  Subjective Assessment - 01/29/19 1616    Subjective  Patient reports her L shoulder/chest feels tight today. Reports 2/10 pain in L shoulder    Pertinent History  Patient is a 43 year old female with breast cancer history beginning in 2016 with multiple benign lumpectomies. Patient reports first malignant finding this past June 2019 in L breast, with following masectomy with saline  expander placed 05/20/18. Patient reports chemo prior with radiation beginning next week 5days/week, for 6 weeks. Patient reports she is also planning to have R masectomy with saline expansion in the future, unscheduled. 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 Patient reports she is a mother of a 3 year old and 65 year old that she reports are very involved in extra-curriculars. Patient reports her ROM in the shoulder is improving, but that  she is having some pain in the inside elbow and into medial forearm with straightening. Patient reports some TTP at incision site, but most all pain at elbow into forearm. Worst pain in past week 7/10 and best 0/10.  Reports pain is sharp in the medial elbow and forearm; denies numbness or tingling         Manual STM withtrigger point releaseTo L pec minor/major, latissimus/teres minor, PROM into all planes70mn in flex, abd, IR, and ER inc ROM as able(focus on ER) GHJAPgrade III 30sec bouts 6bouts, and with ER for 6 additional bouts, bilat UE with full ER following, approx 50d LLE and 60d RLE before Increased ROM throughout manual techniques and decreased pain noted from patient following.   Ther-Ex - Supine pec stretch on 1/2 foam 141m  - Band pull aparts BluTB 3x 10 with min cuing initially to prevent shoulder hiking with good carry over following - Scapular push up from knees 3x 10 with min cuing for initial set up with good carry over following - TRX pull up walking 3x 10 feet each trial - Bent over rows 10# x10; 15# 2x 10 with min cuing for set up posture with good carry over following                       PT Education - 01/29/19 1637    Education Details  Exercise form    Person(s) Educated  Patient    Methods  Explanation;Demonstration;Verbal cues    Comprehension  Verbalized understanding;Returned demonstration;Verbal cues required       PT Short Term Goals - 06/21/18 1143      PT  SHORT TERM GOAL #1   Title  Pt will be independent with HEP in order to improve strength and motion, and to decrease pain in order to improve pain-free function at home and work.    Time  4    Period  Weeks    Status  New        PT Long Term Goals - 12/27/18 1049      PT LONG TERM GOAL #1   Title  Pt will decrease worst pain as reported on NPRS by at least 3 points in order to demonstrate clinically significant reduction in pain.    Baseline  12/27/18 worst pain 6/10  following RUE expander placement    Time  8    Period  Weeks    Status  Revised      PT LONG TERM GOAL #2  Title  Patient will increase FOTO score to 72 to demonstrate predicted increase in functional mobility to complete ADLs    Baseline  10/24/18     Time  8      PT LONG TERM GOAL #3   Title  Patient will demonstrate all active, painfree shoulder ROM within normal limits in order to complete ADLs    Baseline  12/27/18 flex:R: 138d L: 145d with "stretch pain" at axilla Abd: R: 95d; L: 170d with pain directly under breast and at edge of extender; IR T10; ER C8    Time  8    Period  Weeks    Status  Revised            Plan - 01/29/19 1659    Clinical Impression Statement  PT continued manual techniques for increased ROM and decreased pain with good success. PT continued therex progression for compound movements with patient requiring some cuing for proper form with good carry over following. PT will continue progression as able.    Stability/Clinical Decision Making  Evolving/Moderate complexity    Clinical Decision Making  Moderate    Rehab Potential  Good    Clinical Impairments Affecting Rehab Potential  (+) age, motivation, social support, (-) current sedentary lifestyle, ongoing cancer treatment, other comorbidities    PT Frequency  2x / week    PT Duration  8 weeks    PT Treatment/Interventions  ADLs/Self Care Home Management;Aquatic Therapy;Electrical Stimulation;Cryotherapy;Moist Heat;Iontophoresis  2m/ml Dexamethasone;Functional mobility training;Therapeutic activities;Therapeutic exercise;Traction;Ultrasound;Neuromuscular re-education;Manual techniques;Patient/family education;Manual lymph drainage;Passive range of motion;Dry needling;Energy conservation;Taping    PT Next Visit Plan  Ease soft tissue restrictions, increase ROM as able    PT Home Exercise Plan  pulleys shoulder abd/flex, sidelying lat stretch, supine ER stretch, scar massage    Consulted and Agree with Plan of Care  Patient       Patient will benefit from skilled therapeutic intervention in order to improve the following deficits and impairments:  Decreased activity tolerance, Decreased endurance, Decreased range of motion, Decreased skin integrity, Hypomobility, Increased fascial restricitons, Impaired UE functional use, Improper body mechanics, Pain, Postural dysfunction, Impaired flexibility, Decreased scar mobility  Visit Diagnosis: 1. Acute pain of left shoulder   2. Acute pain of right shoulder        Problem List Patient Active Problem List   Diagnosis Date Noted  . Melena   . Epistaxis 01/08/2019  . Acquired absence of right breast 12/17/2018  . Admission for breast reconstruction following mastectomy 12/09/2018  . Use of tamoxifen (Nolvadex) 12/04/2018  . Acquired absence of left breast 09/03/2018  . Visual changes 08/20/2018  . Breast asymmetry following reconstructive surgery 08/13/2018  . Hot flashes due to menopause 06/18/2018  . Status post left mastectomy 06/04/2018  . S/P breast reconstruction 05/28/2018  . Vasomotor symptoms due to menopause 04/17/2018  . Elevated LFTs 03/17/2018  . Goals of care, counseling/discussion 03/17/2018  . Hypomagnesemia 03/04/2018  . Diarrhea 01/02/2018  . Encounter for antineoplastic chemotherapy 12/21/2017  . Encounter for antineoplastic immunotherapy 12/21/2017  . 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 Breast mass, right 12/05/2016  .  Monoallelic mutation of CHEK2 gene in female patient 06/20/2015  . [place] of [place]outer [place] 09/17[place]2014   CShelton SilvasPT, DPT CShelton Silvas8/05/2019, 5:09 PM  [facility] [address] [place] [address]Phone: [phone]  Fax:  [phone] Name: Denise  KIMARIA [name] MRN: [id number] Date of Birth: 02-08-1976

## 2019-02-03 ENCOUNTER — Telehealth: Payer: Self-pay

## 2019-02-03 NOTE — Telephone Encounter (Signed)

## 2019-02-04 ENCOUNTER — Other Ambulatory Visit: Payer: Self-pay

## 2019-02-04 ENCOUNTER — Ambulatory Visit (INDEPENDENT_AMBULATORY_CARE_PROVIDER_SITE_OTHER): Payer: BC Managed Care – PPO | Admitting: Surgical

## 2019-02-04 ENCOUNTER — Encounter: Payer: Self-pay | Admitting: Surgical

## 2019-02-04 VITALS — BP 124/78 | HR 77 | Temp 98.2°F | Ht 67.0 in | Wt 188.2 lb

## 2019-02-04 DIAGNOSIS — Z9889 Other specified postprocedural states: Secondary | ICD-10-CM

## 2019-02-04 DIAGNOSIS — Z9882 Breast implant status: Secondary | ICD-10-CM

## 2019-02-04 DIAGNOSIS — Z9012 Acquired absence of left breast and nipple: Secondary | ICD-10-CM

## 2019-02-04 NOTE — Progress Notes (Signed)
Subjective:     Patient ID: JHANAE RATHBUN, female    DOB: 1975/09/06, 43 y.o.   MRN: [id number]  Chief Complaint  Patient presents with  . Follow-up    HPI: The patient is a 42 y.o. female here for follow-up on her right breast surgery. She currently has an expander in her right breast. She was last seen 01/21/19.   She is doing great. She is near the final size she would like. Expect ~ 1-2 more fills. Incisions are healed nicely. No sign of infection, seroma, hematoma.  No fevers, chills, n/v.   Review of Systems  Constitutional: Negative.   Respiratory: Negative.   Cardiovascular: Negative.   Genitourinary: Negative.   Musculoskeletal: Negative.   Skin: Negative.   Neurological: Negative.      Objective:   Vital Signs BP 124/78 (BP Location: Right Arm, Patient Position: Sitting, Cuff Size: Normal)   Pulse 77   Temp 98.2 F (36.8 C) (Temporal)   Ht 5\' 7"  (1.702 m)   Wt 188 lb 3.2 oz (85.4 kg)   SpO2 98%   BMI 29.48 kg/m  Vital Signs and Nursing Note Reviewed Chaperone present Physical Exam  Constitutional: She is oriented to person, place, and time and well-developed, well-nourished, and in no distress. No distress.  HENT:  Head: Normocephalic and atraumatic.  Cardiovascular: Normal rate.  Pulmonary/Chest: Effort normal.  Musculoskeletal: Normal range of motion.  Neurological: She is alert and oriented to person, place, and time. Gait normal.  Skin: Skin is warm and dry. No rash noted. She is not diaphoretic. No erythema. No pallor.  Psychiatric: Mood and affect normal.    Assessment/Plan:     ICD-10-CM   1. S/P breast reconstruction  Z98.82   2. Status post left mastectomy  Z90.12     Denise [name] is doing well. Incisions are healing nicely. She is near her desired size.   We placed injectable saline in the Expander using a sterile technique: Right:50cc for a total of 400/ 375cc Left:450/ 375cc  Follow up in 2 weeks.  Kermit Balo  Marvell Stavola, PA-C 02/04/2019, 9:26 AM

## 2019-02-06 ENCOUNTER — Encounter: Payer: Self-pay | Admitting: Physical Therapy

## 2019-02-06 ENCOUNTER — Other Ambulatory Visit: Payer: Self-pay

## 2019-02-06 ENCOUNTER — Ambulatory Visit: Payer: BC Managed Care – PPO | Admitting: Physical Therapy

## 2019-02-06 DIAGNOSIS — M25511 Pain in right shoulder: Secondary | ICD-10-CM

## 2019-02-06 DIAGNOSIS — M25512 Pain in left shoulder: Secondary | ICD-10-CM

## 2019-02-06 NOTE — Therapy (Signed)
[place] [facility] [address] [place], [address] Phone: [phone]   Fax:  [phone]  Physical Therapy Treatment  Patient Details  Name: Denise [name] MRN: [id number] Date of Birth: [birth year] Referring Provider (PT): Dillingham   Encounter Date: 02/06/2019  PT End of Session - 02/06/19 1815    Visit Number  17    Number of Visits  33    Date for PT Re-Evaluation  02/21/19    PT Start Time  0505    PT Stop Time  0545    PT Time Calculation (min)  40 min    Activity Tolerance  Patient tolerated treatment well    Behavior During Therapy  [facility] for tasks assessed/performed       Past Medical History:  Diagnosis Date  . Abnormal breast biopsy    PASH, [name]  . Breast cancer ([place]) 11/2017   left breast; ER/PR/Her2neu POS  . Family history of breast cancer   . Family history of ovarian cancer   . Fibroadenoma of breast, left 2000, 2016  . Genetic screening 08/26/2013   BRCA/BART negative/Myriad, CHEK2 POS 2017  . Increased risk of breast cancer 2017   IBIS=47%  . Left breast lump 06/18/2017   Fluid/drained [name]  . Migraine   . Monoallelic mutation of CHEK2 gene in female patient 2017   increased risk of breast and colon cancer  . Personal history of chemotherapy   . Personal history of radiation therapy     Past Surgical History:  Procedure Laterality Date  . BREAST BIOPSY Bilateral 09/08/14   neg  . BREAST BIOPSY Left 03/16/2015   Procedure: BREAST BIOPSY WITH NEEDLE LOCALIZATION;  Surgeon: [name], MD;  Location: [facility]S;  Service: General;  Laterality: Left;  . BREAST BIOPSY Left 11/2017   positive  . BREAST CYST ASPIRATION Left 06/18/2017   cyst aspiration  . BREAST EXCISIONAL BIOPSY Left 09/2014  . BREAST EXCISIONAL BIOPSY Right    age 15's  . BREAST RECONSTRUCTION WITH PLACEMENT OF TISSUE EXPANDER AND FLEX HD (ACELLULAR HYDRATED DERMIS) Left 05/20/2018   Procedure:  BREAST RECONSTRUCTION WITH PLACEMENT OF TISSUE EXPANDER AND FLEX HD (ACELLULAR HYDRATED DERMIS);  Surgeon: [name], DO;  Location: [facility] ORS;  Service: Plastics;  Laterality: Left;  . BREAST RECONSTRUCTION WITH PLACEMENT OF TISSUE EXPANDER AND FLEX HD (ACELLULAR HYDRATED DERMIS) Right 12/09/2018   Procedure: BREAST RECONSTRUCTION WITH PLACEMENT OF TISSUE EXPANDER AND FLEX HD (ACELLULAR HYDRATED DERMIS);  Surgeon: [name], DO;  Location: [facility]S;  Service: Plastics;  Laterality: Right;  . BREAST SURGERY Right March 2000   benign fibroadenoma  . BREAST SURGERY Left 08/08/13   excision  . BREAST SURGERY Left 09/23/14   excision  . ESOPHAGOGASTRODUODENOSCOPY (EGD) WITH PROPOFOL N/A 01/20/2019   Procedure: ESOPHAGOGASTRODUODENOSCOPY (EGD) WITH PROPOFOL;  Surgeon: [name], MD;  Location: [place];  Service: Gastroenterology;  Laterality: N/A;  . MASTECTOMY Left 05/20/2018  . MASTECTOMY W/ SENTINEL NODE BIOPSY Left 05/20/2018   Procedure: MASTECTOMY WITH SENTINEL LYMPH NODE BIOPSY;  Surgeon: [name], MD;  Location: [facility];  Service: General;  Laterality: Left;  . PORTACATH PLACEMENT Right 12/17/2017   Procedure: INSERTION PORT-A-CATH;  Surgeon: [name], MD;  Location: [facility];  Service: General;  Laterality: Right;  . SIMPLE MASTECTOMY WITH AXILLARY SENTINEL NODE BIOPSY Right 12/09/2018   Procedure: SIMPLE MASTECTOMY RIGHT;  Surgeon: [name], MD;  Location: [facility]  ORS;  Service: General;  Laterality: Right;    There were no vitals filed for this visit.  Subjective Assessment - 02/06/19 1708    Subjective  Patient reports some stiffness at L shoulder, with some pain at edge of R extender. 1/10  pain today bilat    Pertinent History  Patient is a 43 year old female with breast cancer history beginning in 2016 with multiple benign lumpectomies. Patient reports first malignant finding this past June 2019 in L breast, with following  masectomy with saline expander placed 05/20/18. Patient reports chemo prior with radiation beginning next week 5days/week, for 6 weeks. Patient reports she is also planning to have R masectomy with saline expansion in the future, unscheduled. 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 Patient reports her ROM in the shoulder is improving, but that  she is having some pain in the inside elbow and into medial forearm with straightening. Patient reports some TTP at incision site, but most all pain at elbow into forearm. Worst pain in past week 7/10 and best 0/10.  Reports pain is sharp in the medial elbow and forearm; denies numbness or tingling    Limitations  House hold activities;Lifting    How long can you sit comfortably?  unlimited    How long can you stand comfortably?  unlimited    How long can you walk comfortably?  unlimited    Patient Stated Goals  Decrease pain        Manual STM withtrigger point releaseTo L pec minor/major, latissimus/teres minor, PROM into all planes71mn in flex, abd, IR, and ER inc ROM as able(focus on ER) GHJAPgrade III 30sec bouts 6bouts, and with ER for 6 additional bouts, bilat UE with full ER following, approx 50d LLE and 60d RLE before Increased ROM throughout manual techniques and decreased pain noted from patient following.   Ther-Ex - Thoracic ext with hands behind head x10 3-5sec hold - Plank position wrist taps 3x 6 each; with cuing for scapular stabilization with good carry over - Lat pulldown 35# 3x 10 with good carry over of proper form technique - TRX high pull up walking 3x 10 feet each trial - Door way pec stretch 2x 60sec holds                         PT Education - 02/06/19 1726    Education Details  Exercise form    Person(s) Educated  Patient    Methods   Explanation;Demonstration;Tactile cues;Verbal cues    Comprehension  Verbalized understanding;Returned demonstration;Verbal cues required;Tactile cues required       PT Short Term Goals - 06/21/18 1143      PT SHORT TERM GOAL #1   Title  Pt will be independent with HEP in order to improve strength and motion, and to decrease pain in order to improve pain-free function at home and work.    Time  4    Period  Weeks    Status  New        PT Long Term Goals - 12/27/18 1049      PT LONG TERM GOAL #1   Title  Pt will decrease worst pain as reported on NPRS by at least 3 points in order to demonstrate clinically significant reduction in pain.    Baseline  12/27/18 worst pain 6/10  following RUE expander placement    Time  8    Period  Weeks    Status  Revised      PT LONG TERM GOAL #2   Title  Patient will increase FOTO score to 72 to demonstrate predicted increase in functional mobility to complete ADLs    Baseline  10/24/18     Time  8      PT LONG TERM GOAL #3   Title  Patient will demonstrate all active, painfree shoulder ROM within normal limits in order to complete ADLs    Baseline  12/27/18 flex:R: 138d L: 145d with "stretch pain" at axilla Abd: R: 95d; L: 170d with pain directly under breast and at edge of extender; IR T10; ER C8    Time  8    Period  Weeks    Status  Revised            Plan - 02/06/19 1829    Clinical Impression Statement  Patient continues to respond well to manual techniques with less tension following. PT led patient through therex progression for scapulo-humeral stabilization and postural strengthening. Patient is able to complete all therex with some modifications and cuing needed for proper form/technique. Patient very motivated throughout session. PT will continue progression as able.    Stability/Clinical Decision Making  Evolving/Moderate complexity    Clinical Decision Making  Moderate    Rehab Potential  Good    Clinical Impairments Affecting  Rehab Potential  (+) age, motivation, social support, (-) current sedentary lifestyle, ongoing cancer treatment, other comorbidities    PT Frequency  2x / week    PT Duration  8 weeks    PT Treatment/Interventions  ADLs/Self Care Home Management;Aquatic Therapy;Electrical Stimulation;Cryotherapy;Moist Heat;Iontophoresis 50m/ml Dexamethasone;Functional mobility training;Therapeutic activities;Therapeutic exercise;Traction;Ultrasound;Neuromuscular re-education;Manual techniques;Patient/family education;Manual lymph drainage;Passive range of motion;Dry needling;Energy conservation;Taping    PT Next Visit Plan  Ease soft tissue restrictions, increase ROM as able    PT Home Exercise Plan  pulleys shoulder abd/flex, sidelying lat stretch, supine ER stretch, scar massage    Consulted and Agree with Plan of Care  Patient       Patient will benefit from skilled therapeutic intervention in order to improve the following deficits and impairments:  Decreased activity tolerance, Decreased endurance, Decreased range of motion, Decreased skin integrity, Hypomobility, Increased fascial restricitons, Impaired UE functional use, Improper body mechanics, Pain, Postural dysfunction, Impaired flexibility, Decreased scar mobility  Visit Diagnosis: Acute pain of left shoulder  Acute pain of right shoulder     Problem List Patient Active Problem List   Diagnosis Date Noted  . Melena   . Epistaxis 01/08/2019  . Acquired absence of right breast 12/17/2018  . Admission for breast reconstruction following mastectomy 12/09/2018  . Use of tamoxifen (Nolvadex) 12/04/2018  . Acquired absence of left breast 09/03/2018  . Visual changes 08/20/2018  . Breast asymmetry following reconstructive surgery 08/13/2018  . Hot flashes due to menopause 06/18/2018  . Status post left mastectomy 06/04/2018  . S/P breast reconstruction 05/28/2018  . Vasomotor symptoms due to menopause 04/17/2018  . Elevated LFTs 03/17/2018  .  Goals of care, counseling/discussion 03/17/2018  . Hypomagnesemia 03/04/2018  . Diarrhea 01/02/2018  . Encounter for antineoplastic chemotherapy 12/21/2017  . Encounter for antineoplastic immunotherapy 12/21/2017  . 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 Breast mass, right 12/05/2016  . Monoallelic mutation of CHEK2 gene in female patient 06/20/2015  . 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 [place], [address] Phone: [phone]   Fax:  [phone]  Name: Denise [name] MRN: [id number] Date of Birth: April 27, 1976

## 2019-02-07 ENCOUNTER — Ambulatory Visit: Payer: BC Managed Care – PPO

## 2019-02-07 ENCOUNTER — Other Ambulatory Visit: Payer: BC Managed Care – PPO

## 2019-02-11 ENCOUNTER — Ambulatory Visit: Payer: BC Managed Care – PPO | Admitting: Physical Therapy

## 2019-02-11 ENCOUNTER — Encounter: Payer: Self-pay | Admitting: Physical Therapy

## 2019-02-11 ENCOUNTER — Other Ambulatory Visit: Payer: Self-pay

## 2019-02-11 DIAGNOSIS — M25512 Pain in left shoulder: Secondary | ICD-10-CM

## 2019-02-11 DIAGNOSIS — M25511 Pain in right shoulder: Secondary | ICD-10-CM

## 2019-02-11 NOTE — Therapy (Signed)
[facility] [address] [place], [address] Phone: [phone]   Fax:  [phone]  Physical Therapy Treatment  Patient Details  Name: Denise [name] MRN: [id number] Date of Birth: 1975-08-03 Referring Provider (PT): D[place]ham   Encounter Date: 02/11/2019  PT End of Session - 02/11/19 1807    Visit Number  18    Date for PT Re-Evaluation  02/21/19    PT Start Time  0545    PT Stop Time  0630    PT Time Calculation (min)  45 min    Activity Tolerance  Patient tolerated treatment well    Behavior During Therapy  [facility] for tasks assessed/performed       Past Medical History:  Diagnosis Date  . Abnormal breast biopsy    [facility], [name]  . Breast cancer ([address]) 11/2017   left breast; ER/PR/Her2neu POS  . Family history of breast cancer   . Family history of ovarian cancer   . Fibroadenoma of breast, left 2000, 2016  . Genetic screening 08/26/2013   BRCA/BART negative/[facility], CHEK2 POS 2017  . Increased risk of breast cancer 2017   IBIS=47%  . Left breast lump 06/18/2017   Fluid/drained [name]  . Migraine   . Monoallelic mutation of CHEK2 gene in female patient 2017   increased risk of breast and colon cancer  . Personal history of chemotherapy   . Personal history of radiation therapy     Past Surgical History:  Procedure Laterality Date  . BREAST BIOPSY Bilateral 09/08/14   neg  . BREAST BIOPSY Left 03/16/2015   Procedure: BREAST BIOPSY WITH NEEDLE LOCALIZATION;  Surgeon: [name], MD;  Location: [facility];  Service: General;  Laterality: Left;  . BREAST BIOPSY Left 11/2017   positive  . BREAST CYST ASPIRATION Left 06/18/2017   cyst aspiration  . BREAST EXCISIONAL BIOPSY Left 09/2014  . BREAST EXCISIONAL BIOPSY Right    age 9's  . BREAST RECONSTRUCTION WITH PLACEMENT OF TISSUE EXPANDER AND FLEX HD (ACELLULAR HYDRATED DERMIS) Left 05/20/2018   Procedure: BREAST RECONSTRUCTION WITH  PLACEMENT OF TISSUE EXPANDER AND FLEX HD (ACELLULAR HYDRATED DERMIS);  Surgeon: [name], DO;  Location: [facility];  Service: Plastics;  Laterality: Left;  . BREAST RECONSTRUCTION WITH PLACEMENT OF TISSUE EXPANDER AND FLEX HD (ACELLULAR HYDRATED DERMIS) Right 12/09/2018   Procedure: BREAST RECONSTRUCTION WITH PLACEMENT OF TISSUE EXPANDER AND FLEX HD (ACELLULAR HYDRATED DERMIS);  Surgeon: [name], DO;  Location: [facility];  Service: Plastics;  Laterality: Right;  . BREAST SURGERY Right March 2000   benign fibroadenoma  . BREAST SURGERY Left 08/08/13   excision  . BREAST SURGERY Left 09/23/14   excision  . ESOPHAGOGASTRODUODENOSCOPY (EGD) WITH PROPOFOL N/A 01/20/2019   Procedure: ESOPHAGOGASTRODUODENOSCOPY (EGD) WITH PROPOFOL;  Surgeon: [name], MD;  Location: [place];  Service: Gastroenterology;  Laterality: N/A;  . MASTECTOMY Left 05/20/2018  . MASTECTOMY W/ SENTINEL NODE BIOPSY Left 05/20/2018   Procedure: MASTECTOMY WITH SENTINEL LYMPH NODE BIOPSY;  Surgeon: [name], MD;  Location: [facility];  Service: General;  Laterality: Left;  . PORTACATH PLACEMENT Right 12/17/2017   Procedure: INSERTION PORT-A-CATH;  Surgeon: [name], MD;  Location: [facility];  Service: General;  Laterality: Right;  . SIMPLE MASTECTOMY WITH AXILLARY SENTINEL NODE BIOPSY Right 12/09/2018   Procedure: SIMPLE MASTECTOMY RIGHT;  Surgeon: [name], MD;  Location: [facility];  Service: General;  Laterality: Right;  There were no vitals filed for this visit.  Subjective Assessment - 02/11/19 1749    Subjective  Patient reports some stiffiness at L shoulder, minimal pain, mostly stretch sensation.    Pertinent History  Patient is a 43 year old female with breast cancer history beginning in 2016 with multiple benign lumpectomies. Patient reports first malignant finding this past June 2019 in L breast, with following masectomy with saline expander placed 05/20/18.  Patient reports chemo prior with radiation beginning next week 5days/week, for 6 weeks. Patient reports she is also planning to have R masectomy with saline expansion in the future, unscheduled. 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 Patient reports she is a mother of a 20 year old and 40 year old that she reports are very involved in extra-curriculars. Patient reports her ROM in the shoulder is improving, but that  she is having some pain in the inside elbow and into medial forearm with straightening. Patient reports some TTP at incision site, but most all pain at elbow into forearm. Worst pain in past week 7/10 and best 0/10.  Reports pain is sharp in the medial elbow and forearm; denies numbness or tingling    Limitations  House hold activities;Lifting    How long can you sit comfortably?  unlimited    How long can you stand comfortably?  unlimited    How long can you walk comfortably?  unlimited    Patient Stated Goals  Decrease pain       Manual STM withtrigger point releaseTo L pec minor/major, latissimus/teres minor, PROM into all planes32mn in flex, abd, IR, and ER inc ROM as able(focus on ER) GHJAPgrade III 30sec bouts 6bouts, and with ER for 6 additional bouts, bilat UE with full ER following, approx 50d LLE and 60d RLE before Increased ROM throughout manual techniques and decreased pain noted from patient following.   Ther-Ex - Sleeper stretch 2x 30sec - Sidelying L ER 5# 3x 10/9/8 - Plank taps with YTB, 2# DB for wrist comfort 3x 5/6/7 taps each LE, min cuing for scapular stabilization with good carry over following - ER at 90d flex bilat YTB 3x 8 with cuing for set up and posture with good carry over following - Thoracic ext with hands behind head x10 3-5sec hold                          PT Education - 02/11/19 1806    Education Details  Exercise form    Person(s) Educated  Patient    Methods   Explanation;Demonstration;Verbal cues    Comprehension  Verbalized understanding;Returned demonstration;Verbal cues required       PT Short Term Goals - 06/21/18 1143      PT SHORT TERM GOAL #1   Title  Pt will be independent with HEP in order to improve strength and motion, and to decrease pain in order to improve pain-free function at home and work.    Time  4    Period  Weeks    Status  New        PT Long Term Goals - 12/27/18 1049      PT LONG TERM GOAL #1   Title  Pt will decrease worst pain as reported on NPRS by at least 3 points in order to demonstrate clinically significant reduction in pain.    Baseline  12/27/18 worst pain 6/10  following RUE expander placement  Time  8    Period  Weeks    Status  Revised      PT LONG TERM GOAL #2   Title  Patient will increase FOTO score to 72 to demonstrate predicted increase in functional mobility to complete ADLs    Baseline  10/24/18     Time  8      PT LONG TERM GOAL #3   Title  Patient will demonstrate all active, painfree shoulder ROM within normal limits in order to complete ADLs    Baseline  12/27/18 flex:R: 138d L: 145d with "stretch pain" at axilla Abd: R: 95d; L: 170d with pain directly under breast and at edge of extender; IR T10; ER C8    Time  8    Period  Weeks    Status  Revised            Plan - 02/11/19 1834    Clinical Impression Statement  Patinet with some increased trigger points in RTC this sessio, with good resolution with manual techniques. Continued diffiuclty with ER motion and activation, and scapular stabilization with therex, but patient is able to correct this well with good motivation throughout session. PT will continue progression as able.    Stability/Clinical Decision Making  Evolving/Moderate complexity    Clinical Decision Making  Moderate    Rehab Potential  Good    Clinical Impairments Affecting Rehab Potential  (+) age, motivation, social support, (-) current sedentary lifestyle,  ongoing cancer treatment, other comorbidities    PT Frequency  2x / week    PT Duration  8 weeks    PT Treatment/Interventions  ADLs/Self Care Home Management;Aquatic Therapy;Electrical Stimulation;Cryotherapy;Moist Heat;Iontophoresis 32m/ml Dexamethasone;Functional mobility training;Therapeutic activities;Therapeutic exercise;Traction;Ultrasound;Neuromuscular re-education;Manual techniques;Patient/family education;Manual lymph drainage;Passive range of motion;Dry needling;Energy conservation;Taping    PT Next Visit Plan  Ease soft tissue restrictions, increase ROM as able    PT Home Exercise Plan  pulleys shoulder abd/flex, sidelying lat stretch, supine ER stretch, scar massage    Consulted and Agree with Plan of Care  Patient       Patient will benefit from skilled therapeutic intervention in order to improve the following deficits and impairments:  Decreased activity tolerance, Decreased endurance, Decreased range of motion, Decreased skin integrity, Hypomobility, Increased fascial restricitons, Impaired UE functional use, Improper body mechanics, Pain, Postural dysfunction, Impaired flexibility, Decreased scar mobility  Visit Diagnosis: Acute pain of left shoulder  Acute pain of right shoulder     Problem List Patient Active Problem List   Diagnosis Date Noted  . Melena   . Epistaxis 01/08/2019  . Acquired absence of right breast 12/17/2018  . Admission for breast reconstruction following mastectomy 12/09/2018  . Use of tamoxifen (Nolvadex) 12/04/2018  . Acquired absence of left breast 09/03/2018  . Visual changes 08/20/2018  . Breast asymmetry following reconstructive surgery 08/13/2018  . Hot flashes due to menopause 06/18/2018  . Status post left mastectomy 06/04/2018  . S/P breast reconstruction 05/28/2018  . Vasomotor symptoms due to menopause 04/17/2018  . Elevated LFTs 03/17/2018  . Goals of care, counseling/discussion 03/17/2018  . Hypomagnesemia 03/04/2018  .  Diarrhea 01/02/2018  . Encounter for antineoplastic chemotherapy 12/21/2017  . Encounter for antineoplastic immunotherapy 12/21/2017  . 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 Breast mass, right 12/05/2016  . Monoallelic mutation of CHEK2 gene in female patient 06/20/2015  . Malignant neoplasm of upper-outer quadrant of female breast ([place] 09/17/2014   CShelton SilvasPT, DPT CShelton Silvas8/25/2020, 6:37  PM  [facility] [address] [place], [address] Phone: [phone]   Fax:  [phone]  Name: Denise [name] MRN: [id number] Date of Birth: 1975-09-27

## 2019-02-12 ENCOUNTER — Other Ambulatory Visit: Payer: Self-pay

## 2019-02-12 DIAGNOSIS — D649 Anemia, unspecified: Secondary | ICD-10-CM

## 2019-02-13 ENCOUNTER — Other Ambulatory Visit: Payer: Self-pay

## 2019-02-13 DIAGNOSIS — Z5112 Encounter for antineoplastic immunotherapy: Secondary | ICD-10-CM

## 2019-02-13 NOTE — Progress Notes (Signed)
Confirmed Name, DOB, and Address. Denies any concerns.  

## 2019-02-13 NOTE — Progress Notes (Signed)
[facility]  [address], [address] [place], [place] [address] Phone: [phone]  Fax: [phone]   Clinic Day:  02/14/2019  Referring physician: [name],*  Chief Complaint: Denise [name] is a 43 y.o. female with multi-focal Her2/neu + left breast cancerwho is seen for 3 weekassessment prior to [place].  HPI: The patient was last seen in the medical oncology clinic on 01/15/2019. At that time, she was fatigued.  She had no further epistaxis.  She noted a sensation of right ear being plugged.  Exam reveals a rim of blood around the right tympanic membrane. Hemoglobin was 8.4. Iron saturation 9%, ferritin 31. She was started on oral iron once daily.   Patient reported melena on Friday, 01/17/2019. She denied any further epistaxis or symptoms of post nasal drip.  Hemoglobin had decreased from 10.4 to 8.8 to 8.4.  We discussed follow-up ENT evaluation. Dr. [name], gastroenterologist, was contacted.   EGD on 01/20/2019 revealed normal duodenal bulb and second portion of the duodenum. Normal stomach. Normal gastroesophageal junction and esophagus.  She was seen on 01/21/2019 and 02/04/2019 by [name] in plastic surgery following her breast reconstruction.   She received Kanjinti on 01/24/2019.   Labs followed:  01/17/2019: hematocrit 25.8, hemoglobin 8.4, MCV 86.9, platelets 244,000, WBC 4,900. Calcium 8.7, Albumin 3.4, magnesium 1.7. Ferritin 24. 01/24/2019: hematocrit 28.3, hemoglobin 9.1, MCV 87.3, platelets 240,000, WBC 4,800. Calcium 8.6, Albumin 3.6, magnesium 1.7. Ferritin 21. Retic count 4.5%.   During the interim, she is felt good.  She is back to work and feels normal.  She is taking an iron supplement with Colace.  Stool is black as she is on iron.  She states that she tolerated her Kanjinti well.  She felt tired after the Kanjinti infusion (? secondary to Benadryl).  She saw [name] last Tuesday and is scheduled to see her  next Tuesday.  She will be having her implant exchange in 03/2019 and is considering port removal at that time.   Past Medical History:  Diagnosis Date   Abnormal breast biopsy    PASH, [name]   Breast cancer [facility]) 11/2017   left breast; ER/PR/Her2neu POS   Family history of breast cancer    Family history of ovarian cancer    Fibroadenoma of breast, left 2000, 2016   Genetic screening 08/26/2013   BRCA/BART negative/Myriad, CHEK2 POS 2017   Increased risk of breast cancer 2017   IBIS=47%   Left breast lump 06/18/2017   Fluid/drained [name]   Migraine    Monoallelic mutation of CHEK2 gene in female patient 2017   increased risk of breast and colon cancer   Personal history of chemotherapy    Personal history of radiation therapy     Past Surgical History:  Procedure Laterality Date   BREAST BIOPSY Bilateral 09/08/14   neg   BREAST BIOPSY Left 03/16/2015   Procedure: BREAST BIOPSY WITH NEEDLE LOCALIZATION;  Surgeon: [name], MD;  Location: [facility];  Service: General;  Laterality: Left;   BREAST BIOPSY Left 11/2017   positive   BREAST CYST ASPIRATION Left 06/18/2017   cyst aspiration   BREAST EXCISIONAL BIOPSY Left 09/2014   BREAST EXCISIONAL BIOPSY Right    age 31's   BREAST RECONSTRUCTION WITH PLACEMENT OF TISSUE EXPANDER AND FLEX HD (ACELLULAR HYDRATED DERMIS) Left 05/20/2018   Procedure: BREAST RECONSTRUCTION WITH PLACEMENT OF TISSUE EXPANDER AND FLEX HD (ACELLULAR HYDRATED DERMIS);  Surgeon: [name], DO;  Location: [facility];  Service: Plastics;  Laterality: Left;   BREAST RECONSTRUCTION WITH PLACEMENT OF TISSUE EXPANDER AND FLEX HD (ACELLULAR HYDRATED DERMIS) Right 12/09/2018   Procedure: BREAST RECONSTRUCTION WITH PLACEMENT OF TISSUE EXPANDER AND FLEX HD (ACELLULAR HYDRATED DERMIS);  Surgeon: [name], DO;  Location: [facility];  Service: Plastics;  Laterality: Right;   BREAST SURGERY Right March 2000   benign  fibroadenoma   BREAST SURGERY Left 08/08/13   excision   BREAST SURGERY Left 09/23/14   excision   ESOPHAGOGASTRODUODENOSCOPY (EGD) WITH PROPOFOL N/A 01/20/2019   Procedure: ESOPHAGOGASTRODUODENOSCOPY (EGD) WITH PROPOFOL;  Surgeon: [name], MD;  Location: [place];  Service: Gastroenterology;  Laterality: N/A;   MASTECTOMY Left 05/20/2018   MASTECTOMY W/ SENTINEL NODE BIOPSY Left 05/20/2018   Procedure: MASTECTOMY WITH SENTINEL LYMPH NODE BIOPSY;  Surgeon: [name], MD;  Location: [facility];  Service: General;  Laterality: Left;   PORTACATH PLACEMENT Right 12/17/2017   Procedure: INSERTION PORT-A-CATH;  Surgeon: [name], MD;  Location: [facility];  Service: General;  Laterality: Right;   SIMPLE MASTECTOMY WITH AXILLARY SENTINEL NODE BIOPSY Right 12/09/2018   Procedure: SIMPLE MASTECTOMY RIGHT;  Surgeon: [name], MD;  Location: [facility];  Service: General;  Laterality: Right;    Family History  Problem Relation Age of Onset   Prostate cancer Father 24       requiring tx/hormones   Breast cancer Paternal Grandmother 36   Breast cancer Maternal Aunt 60   Ovarian cancer Maternal Aunt 70   Breast cancer Paternal Aunt 60   Pancreatic cancer Maternal Aunt 75    Social History:  reports that she has never smoked. She has never used smokeless tobacco. She reports current alcohol use. She reports that she does not use drugs. She does not smoke. She "occasionally" drinks alcohol. 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 Patient denies known exposures to radiation on toxins. Her husband's name is [name]. She has 2 daughters, [name] and [name] (ages 49 1/2 and 8). Her husband's name is [name].The patient is alone today.  Allergies:  Allergies  Allergen Reactions   Steri-Strip Compound Benzoin [Benzoin Compound]     Steri-strip ,    Tape Other (See Comments)    Minor skin irritation     Current  Medications: Current Outpatient Medications  Medication Sig Dispense Refill   acetaminophen (TYLENOL) 500 MG tablet Take 500 mg by mouth every 6 (six) hours as needed for moderate pain or headache.     diazepam (VALIUM) 2 MG tablet Take 1 tablet (2 mg total) by mouth every 6 (six) hours as needed for anxiety. 30 tablet 0   docusate sodium (COLACE) 100 MG capsule Take 100 mg by mouth daily.     HYDROcodone-acetaminophen (NORCO) 5-325 MG tablet Take 1 tablet by mouth every 8 (eight) hours as needed for moderate pain. 15 tablet 0   lidocaine-prilocaine (EMLA) cream Apply 1 application topically as needed (port access).     metoprolol succinate (TOPROL-XL) 25 MG 24 hr tablet Take 1 tablet (25 mg total) by mouth daily. 30 tablet 0   omeprazole (PRILOSEC OTC) 20 MG tablet Take 20 mg by mouth daily as needed (for acid reflux).     tamoxifen (NOLVADEX) 20 MG tablet Take 1 tablet (20 mg total) by mouth daily. 30 tablet 3   glucose blood (PRECISION QID TEST) test strip Use 1 each (1 strip total) once daily Use as instructed.  Lancets Misc. (UNISTIK 2 NORMAL) MISC Use 1 each once daily Use as instructed.     magnesium oxide (MAG-OX) 400 MG tablet Take 1 tablet (400 mg total) by mouth daily. (Patient not taking: Reported on 02/13/2019) 30 tablet 0   No current facility-administered medications for this visit.    Facility-Administered Medications Ordered in Other Visits  Medication Dose Route Frequency Provider Last Rate Last Dose   sodium chloride flush (NS) 0.9 % injection 10 mL  10 mL Intravenous PRN Nolon Stalls C, MD   10 mL at 04/17/18 0844    Review of Systems  Constitutional: Negative for chills, diaphoresis, fever, malaise/fatigue and weight loss (flucuates 182-186 pounds).       Feels "good".  HENT: Negative for congestion, ear discharge, ear pain, hearing loss (ear congestion resolved), nosebleeds, sinus pain, sore throat and tinnitus.   Eyes: Negative.  Negative for  blurred vision, double vision, photophobia, pain, discharge and redness.  Respiratory: Negative.  Negative for cough, hemoptysis, sputum production and shortness of breath.   Cardiovascular: Negative.  Negative for chest pain, palpitations, orthopnea, leg swelling and PND.  Gastrointestinal: Negative.  Negative for abdominal pain, blood in stool, constipation, diarrhea, heartburn, melena, nausea and vomiting.       Stool dark on oral iron.  Genitourinary: Negative.  Negative for frequency, hematuria and urgency.  Musculoskeletal: Negative.  Negative for back pain, falls, joint pain, myalgias and neck pain.  Skin: Negative.  Negative for itching and rash.  Neurological: Negative.  Negative for dizziness, tremors, sensory change, speech change, focal weakness, weakness and headaches.  Endo/Heme/Allergies: Negative.  Does not bruise/bleed easily.       Diabetes.  Psychiatric/Behavioral: Negative.  Negative for depression and memory loss. The patient is not nervous/anxious and does not have insomnia.   All other systems reviewed and are negative.  Performance status (ECOG):  0  Vitals Blood pressure 119/83, pulse 80, temperature 98.2 F (36.8 C), resp. rate 18, height _0  (1.702 m), weight 184 lb 3.1 oz (83.6 kg), SpO2 98 %.   Physical Exam  Constitutional: She is oriented to person, place, and time. Vital signs are normal. She appears well-developed and well-nourished. No distress.  HENT:  Head: Normocephalic and atraumatic.  Nose: No epistaxis.  Mouth/Throat: Oropharynx is clear and moist. No oropharyngeal exudate.  Short brown hair. Mask.  Eyes: Pupils are equal, round, and reactive to light. Conjunctivae and EOM are normal. No scleral icterus.  Brown eyes.  Neck: Normal range of motion. Neck supple. No JVD present.  Cardiovascular: Normal rate, regular rhythm and normal heart sounds. Exam reveals no gallop and no friction rub.  No murmur heard. Pulmonary/Chest: Effort normal and  breath sounds normal. No respiratory distress. She has no wheezes. She has no rales.  Abdominal: Soft. Bowel sounds are normal. She exhibits no distension and no mass. There is no abdominal tenderness. There is no rebound and no guarding.  Musculoskeletal: Normal range of motion.        General: No tenderness or edema.  Lymphadenopathy:    She has no cervical adenopathy.    She has no axillary adenopathy.       Right: No supraclavicular adenopathy present.       Left: No supraclavicular adenopathy present.  Neurological: She is alert and oriented to person, place, and time.  Skin: Skin is warm and dry. No rash noted. She is not diaphoretic. No erythema. No pallor.  Psychiatric: She has a normal mood and affect. Her  behavior is normal. Judgment and thought content normal.  Nursing note and vitals reviewed.   Infusion on 02/14/2019  Component Date Value Ref Range Status   Preg Test, Ur 02/14/2019 NEGATIVE  NEGATIVE Final   Performed at [facility], [address], [place], [place] [address]   Ferritin 02/14/2019 21  11 - 307 ng/mL Final   Performed at [facility], [address] [place], [place] [address]   Sodium 02/14/2019 140  135 - 145 mmol/L Final   Potassium 02/14/2019 4.2  3.5 - 5.1 mmol/L Final   Chloride 02/14/2019 108  98 - 111 mmol/L Final   CO2 02/14/2019 24  22 - 32 mmol/L Final   Glucose, Bld 02/14/2019 103* 70 - 99 mg/dL Final   BUN 02/14/2019 12  6 - 20 mg/dL Final   Creatinine, Ser 02/14/2019 0.75  0.44 - 1.00 mg/dL Final   Calcium 02/14/2019 9.1  8.9 - 10.3 mg/dL Final   Total Protein 02/14/2019 7.5  6.5 - 8.1 g/dL Final   Albumin 02/14/2019 4.1  3.5 - 5.0 g/dL Final   AST 02/14/2019 24  15 - 41 U/L Final   ALT 02/14/2019 19  0 - 44 U/L Final   Alkaline Phosphatase 02/14/2019 58  38 - 126 U/L Final   Total Bilirubin 02/14/2019 0.8  0.3 - 1.2 mg/dL Final   GFR calc non Af Amer 02/14/2019 >60  >60 mL/min Final   GFR calc Af  Amer 02/14/2019 >60  >60 mL/min Final   Anion gap 02/14/2019 8  5 - 15 Final   Performed at [facility], [address] [place], [place] [address]   WBC 02/14/2019 4.9  4.0 - 10.5 K/uL Final   RBC 02/14/2019 4.03  3.87 - 5.11 MIL/uL Final   Hemoglobin 02/14/2019 11.2* 12.0 - 15.0 g/dL Final   HCT 02/14/2019 34.6* 36.0 - 46.0 % Final   MCV 02/14/2019 85.9  80.0 - 100.0 fL Final   MCH 02/14/2019 27.8  26.0 - 34.0 pg Final   MCHC 02/14/2019 32.4  30.0 - 36.0 g/dL Final   RDW 02/14/2019 13.8  11.5 - 15.5 % Final   Platelets 02/14/2019 240  150 - 400 K/uL Final   nRBC 02/14/2019 0.0  0.0 - 0.2 % Final   Neutrophils Relative % 02/14/2019 60  % Final   Neutro Abs 02/14/2019 2.9  1.7 - 7.7 K/uL Final   Lymphocytes Relative 02/14/2019 28  % Final   Lymphs Abs 02/14/2019 1.4  0.7 - 4.0 K/uL Final   Monocytes Relative 02/14/2019 8  % Final   Monocytes Absolute 02/14/2019 0.4  0.1 - 1.0 K/uL Final   Eosinophils Relative 02/14/2019 3  % Final   Eosinophils Absolute 02/14/2019 0.2  0.0 - 0.5 K/uL Final   Basophils Relative 02/14/2019 1  % Final   Basophils Absolute 02/14/2019 0.0  0.0 - 0.1 K/uL Final   Immature Granulocytes 02/14/2019 0  % Final   Abs Immature Granulocytes 02/14/2019 0.01  0.00 - 0.07 K/uL Final   Performed at [facility], [address], [place], [place] [address]    Assessment:  Denise [name] is a 43 y.o. female with multi-focal stage IB Her2/neu + left breast cancers/p neoadjuvant chemotherapy followed by mastectomywith sentinel lymph node biopsy on 05/20/2018. Pathologyrevealed no residual carcinoma in the breast. One of three sentinel lymph nodes were positive. One lymph node had 2 metastatic foci (2.1 mm and 1 mm). Pathologic stagerevealed ypT0  ypN1a.  Index breast mass and axillary nodebiopsyon 12/06/2017 revealed grade II invasive ductal carcinoma with calcifications at the 11 o'clock position. There was  metastatic carcinoma in 1 of 1 lymph nodes. Tumor was ER + (100%), PR + (100%), and Ki67 5%. Her2/neu was 3+ by IHC (heterogeneous) and FISH +.  Diagnostic left mammogram and ultrasoundon 12/06/2017 revealed a 1.7 x 1.3 x 1.3 cm irregular hypoechoic mass with internal calcifications at the 11 o'clock position 2 cm from the nipple with internal calcifications. There was a similar appearing 0.9 x 0.8 x 0.7 cm mass at the 11 o'clock position 4 cm from the nipple (2.4 cm from the index lesion). There were suspicious, segmental calcifications originating from the index lesion and extending 5 cm posteriorly. There was a 0.6 cm morphologically abnormal left axillary lymph node.  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 The adjacent and more superiorly located 11 o'clock mass (7 x 12 mm) seen on recent ultrasound, 4 cm from the nipple on ultrasound, was also identified. There were 2 probable satellite lesions (7 mm, medial lesion; posterior and lateral lesion). The total span of malignancywas 3 x 3.1 x 4.5 cm. There were calcificationsextending up to 5 cm posterior to the biopsied malignancy which were highly suspicious on mammography but not appreciated. There was a mass in the lower outer left breast measured 5 mm, representing a change. The known metastatic nodein the left axilla was identified. A node along the posterolateral margin of the pectoralis minor (cortex 5 mm) was nonspecific but at least somewhat suspicious.  Chest, abdomen, and pelvic CTon 12/19/2017 revealed a solitary enlarged biopsy-proven metastatic left axillary lymph node. There were no additional findings suspicious for metastatic disease in the chest, abdomen or pelvis. There was a nonspecific tiny sclerotic upper right sacral lesion, more likely a benign bone island. Bone scintigraphy correlation versus  follow-up CT could be considered.  Bone scanon 01/11/2018 was negative for metastatic pattern. No areas of focal tracer uptake. Area of concern in sacrum on CT likely represents benign bone island.   Myriad genetic testingon 08/26/2013 was negative for BRCA1 and 2. CHEK2 mutationpositive (increased risk of breast and colon cancer). She has a family history significant for breast, ovarian, pancreatic, and prostate cancer.  She received 6 cycles of TCHP chemotherapy (12/21/2017 - 04/17/2018) with Margarette Canada support. Cycle #3 was held due to elevated LFTs on 02/04/2018. She received Herceptin + Perjeta alone (04/30/2018 - 05/30/2018). She received8cycles of Kadcyla(06/20/2018 - 07/11/2018; 08/20/2018- 12/25/2018). Cycle #3 was held on 08/01/2018 due to visual changes (blurred vision).  Cycle #8 was complicated by epistaxis requiring admission.  She received Kanjinti on 01/24/2019.  She received breast radiationfrom 06/26/2018 - 08/05/2018.Sheunderwentright prophylactic mastectomywithreconstructionon 12/09/2018. Pathologyon 12/10/2018 revealed benign breast tissue. Sentinellymphnode was negative.  Echocardiograms: EF 60-65% on 12/20/2017, 60-65% on 03/27/2018, 55-60% on 06/18/2018, 55-60% on 09/27/2018, and 60-65% on 12/25/2018.  She has a history of elevated LFTs. Hepatitis B and C serologies were negative on 02/04/2018.RUQ ultrasound on 02/06/2018 revealed a small anterior gallbladder wall polyp. There was no evidence of cholelithiasis or cholecystis. CBD measured normal at 2 mm, with no evidence of choledocholithiasis. There was normal direction of blood flow towards the liver noted.  She has vasomotor symptoms. She discontinued Effexor around 09/18/2018. She was amenorrheic from 11/2017 - 10/15/2018. [place] and estradiol confirmed a premenopausalstate on 10/01/2018. Mensesrestarted on 10/15/2018.  She  was [facility] from 01[facility]08/2019 - 01/09/2019 for persistent  epistaxis.  Head CT revealed minimal mucosal thickening of the right sphenoid sinus and was otherwise normal. She underwent right nare airpacking.  She is on doxycycline 15m x10 days. Etiology was felt secondary to high blood pressure. She was discharged on metoprolol.  Symptomatically, she is doing well.  Energy level has improved.  Ear congestion s/p hemotympanum has resolved.  She remains on oral iron.  Exam is unrermakable.  Plan: 1.   Labs today:  CBC with diff, CMP, ferritin, urine pregnancy test. 2.Stage IB multifocal LEFT breast cancer Clinically, she is doing well.  She iss/pprophylactic mastectomy with reconstruction on 12/09/2018. She is scheduled for implant exchange in 03/2019.            She has received 51of a planned 52weeks of HER-2/neu directed therapy up to today's date.   Kanjinti (trastuzumab biosmilar) today. Continue tamoxifen. 3.Epistaxis             No further episodes s/p discontinuation of Kadcyla. 4.Iron deficiency anemia             Hematocrit 25.8.  Hemoglobin 8.4.  MCV 86.9 on 01/17/2019.  Hematocrit 34.6.  Hemoglobin 11.2.  MCV 85.9 today.             Ferritin 21 today.  Ferritin goal 100.  Continue oral iron.  Anemia was secondary to significant epistaxis (resolved).  EGD on 01/20/2019 was normal. 5.RTC every 6-12 weeks for port flush until removal. 6.   RTC in 3 months for MD assessment and labs (CBC with diff, CMP, CA27.29, ferritin).  I discussed the assessment and treatment plan with the patient.  The patient was provided an opportunity to ask questions and all were answered.  The patient agreed with the plan and demonstrated an understanding of the instructions.  The patient was advised to call back if the symptoms worsen or if the condition fails to improve as anticipated.   MLequita Asal MD, PhD    02/14/2019, 5:18 PM

## 2019-02-14 ENCOUNTER — Inpatient Hospital Stay (HOSPITAL_BASED_OUTPATIENT_CLINIC_OR_DEPARTMENT_OTHER): Payer: BC Managed Care – PPO | Admitting: Hematology and Oncology

## 2019-02-14 ENCOUNTER — Encounter: Payer: Self-pay | Admitting: Hematology and Oncology

## 2019-02-14 ENCOUNTER — Other Ambulatory Visit: Payer: Self-pay | Admitting: Hematology and Oncology

## 2019-02-14 ENCOUNTER — Inpatient Hospital Stay: Payer: BC Managed Care – PPO

## 2019-02-14 VITALS — BP 122/79 | HR 75 | Resp 18

## 2019-02-14 VITALS — BP 119/83 | HR 80 | Temp 98.2°F | Resp 18 | Ht 67.0 in | Wt 184.2 lb

## 2019-02-14 DIAGNOSIS — D500 Iron deficiency anemia secondary to blood loss (chronic): Secondary | ICD-10-CM | POA: Diagnosis not present

## 2019-02-14 DIAGNOSIS — C50412 Malignant neoplasm of upper-outer quadrant of left female breast: Secondary | ICD-10-CM

## 2019-02-14 DIAGNOSIS — Z5112 Encounter for antineoplastic immunotherapy: Secondary | ICD-10-CM | POA: Diagnosis not present

## 2019-02-14 DIAGNOSIS — Z170 Estrogen receptor positive status [ER+]: Secondary | ICD-10-CM

## 2019-02-14 DIAGNOSIS — R040 Epistaxis: Secondary | ICD-10-CM

## 2019-02-14 DIAGNOSIS — C50419 Malignant neoplasm of upper-outer quadrant of unspecified female breast: Secondary | ICD-10-CM

## 2019-02-14 DIAGNOSIS — D649 Anemia, unspecified: Secondary | ICD-10-CM

## 2019-02-14 LAB — COMPREHENSIVE METABOLIC PANEL
ALT: 19 U/L (ref 0–44)
AST: 24 U/L (ref 15–41)
Albumin: 4.1 g/dL (ref 3.5–5.0)
Alkaline Phosphatase: 58 U/L (ref 38–126)
Anion gap: 8 (ref 5–15)
BUN: 12 mg/dL (ref 6–20)
CO2: 24 mmol/L (ref 22–32)
Calcium: 9.1 mg/dL (ref 8.9–10.3)
Chloride: 108 mmol/L (ref 98–111)
Creatinine, Ser: 0.75 mg/dL (ref 0.44–1.00)
GFR calc Af Amer: >60 mL/min (ref >60)
GFR calc non Af Amer: >60 mL/min (ref >60)
Glucose, Bld: 103 mg/dL — ABNORMAL HIGH (ref 70–99)
Potassium: 4.2 mmol/L (ref 3.5–5.1)
Sodium: 140 mmol/L (ref 135–145)
Total Bilirubin: 0.8 mg/dL (ref 0.3–1.2)
Total Protein: 7.5 g/dL (ref 6.5–8.1)

## 2019-02-14 LAB — CBC WITH DIFFERENTIAL/PLATELET
Abs Immature Granulocytes: 0.01 10*3/uL (ref 0.00–0.07)
Basophils Absolute: 0 10*3/uL (ref 0.0–0.1)
Basophils Relative: 1 %
Eosinophils Absolute: 0.2 10*3/uL (ref 0.0–0.5)
Eosinophils Relative: 3 %
HCT: 34.6 % — ABNORMAL LOW (ref 36.0–46.0)
Hemoglobin: 11.2 g/dL — ABNORMAL LOW (ref 12.0–15.0)
Immature Granulocytes: 0 %
Lymphocytes Relative: 28 %
Lymphs Abs: 1.4 10*3/uL (ref 0.7–4.0)
MCH: 27.8 pg (ref 26.0–34.0)
MCHC: 32.4 g/dL (ref 30.0–36.0)
MCV: 85.9 fL (ref 80.0–100.0)
Monocytes Absolute: 0.4 10*3/uL (ref 0.1–1.0)
Monocytes Relative: 8 %
Neutro Abs: 2.9 10*3/uL (ref 1.7–7.7)
Neutrophils Relative %: 60 %
Platelets: 240 10*3/uL (ref 150–400)
RBC: 4.03 MIL/uL (ref 3.87–5.11)
RDW: 13.8 % (ref 11.5–15.5)
WBC: 4.9 10*3/uL (ref 4.0–10.5)
nRBC: 0 % (ref 0.0–0.2)

## 2019-02-14 LAB — PREGNANCY, URINE: Preg Test, Ur: NEGATIVE

## 2019-02-14 LAB — FERRITIN: Ferritin: 21 ng/mL (ref 11–307)

## 2019-02-14 MED ORDER — SODIUM CHLORIDE 0.9% FLUSH
10.0000 mL | INTRAVENOUS | Status: DC | PRN
  Administered 2019-02-14: 09:00:00 10 mL via INTRAVENOUS
  Filled 2019-02-14: qty 10

## 2019-02-14 MED ORDER — SODIUM CHLORIDE 0.9 % IV SOLN
Freq: Once | INTRAVENOUS | Status: AC
  Administered 2019-02-14: 11:00:00 via INTRAVENOUS
  Filled 2019-02-14: qty 250

## 2019-02-14 MED ORDER — ACETAMINOPHEN 325 MG PO TABS
650.0000 mg | ORAL_TABLET | Freq: Once | ORAL | Status: AC
  Administered 2019-02-14: 650 mg via ORAL
  Filled 2019-02-14: qty 2

## 2019-02-14 MED ORDER — DIPHENHYDRAMINE HCL 25 MG PO CAPS
25.0000 mg | ORAL_CAPSULE | Freq: Once | ORAL | Status: AC
  Administered 2019-02-14: 11:00:00 25 mg via ORAL
  Filled 2019-02-14: qty 1

## 2019-02-14 MED ORDER — TRASTUZUMAB-ANNS CHEMO 150 MG IV SOLR
150.0000 mg | Freq: Once | INTRAVENOUS | Status: AC
  Administered 2019-02-14: 150 mg via INTRAVENOUS
  Filled 2019-02-14: qty 7.14

## 2019-02-14 MED ORDER — HEPARIN SOD (PORK) LOCK FLUSH 100 UNIT/ML IV SOLN
500.0000 [IU] | Freq: Once | INTRAVENOUS | Status: AC
  Administered 2019-02-14: 500 [IU] via INTRAVENOUS
  Filled 2019-02-14: qty 5

## 2019-02-14 NOTE — Progress Notes (Signed)
No new changes noted today 

## 2019-02-17 NOTE — Progress Notes (Signed)
Subjective:     Patient ID: Denise [name], female    DOB: 1976/03/23, 43 y.o.   MRN: [id number]  Chief Complaint  Patient presents with  . Follow-up    HPI: The patient is a 43 y.o. female here for follow-up on breast reconstruction. She is scheduled for expander to implant exchange on 04/02/19 with Dr. Ulice Bold.  She is doing great. Incisions c/d/i. No sign of infection, seroma, hematoma.   She is close to her desired size. The right breast currently has 450 cc and the left has 480 cc. She had some questions about the size of the breasts and it appears the left appears smaller despite the larger volume due to the right breast having additional tissue providing some volume.  The skin over the L expander is tight and she may not be able to have much more expansion.  She is tolerating expansion well. No fevers, chills, n/v.  Review of Systems  Constitutional: Negative for chills, diaphoresis, fever, malaise/fatigue and weight loss.  Respiratory: Negative.   Cardiovascular: Negative.   Genitourinary: Negative.   Musculoskeletal: Negative for back pain, myalgias and neck pain.  Skin: Negative for itching and rash.  Neurological: Negative.      Objective:   Vital Signs BP 116/77 (BP Location: Right Arm, Patient Position: Sitting, Cuff Size: Normal)   Pulse 61   Temp 98.4 F (36.9 C) (Temporal)   Ht 5\' 7"  (1.702 m)   Wt 184 lb (83.5 kg)   SpO2 100%   BMI 28.82 kg/m  Vital Signs and Nursing Note Reviewed Chaperone present Physical Exam  Constitutional: She is oriented to person, place, and time and well-developed, well-nourished, and in no distress. No distress.  HENT:  Head: Normocephalic and atraumatic.  Cardiovascular: Normal rate.  Pulmonary/Chest: Effort normal.  Bilateral incisions are c/d/i.  Musculoskeletal: Normal range of motion.  Neurological: She is alert and oriented to person, place, and time. Gait normal.  Skin: Skin is warm and dry. No rash noted.  She is not diaphoretic. No erythema. No pallor.  Psychiatric: Mood and affect normal.      Assessment/Plan:     ICD-10-CM   1. S/P breast reconstruction  Z98.82   2. Malignant neoplasm of upper-outer quadrant of right female breast, unspecified estrogen receptor status (HCC)  C50.411   3. Malignant neoplasm of upper-outer quadrant of left breast in female, estrogen receptor positive (HCC)  C50.412    Z17.0    Overall, Denise [name] is doing great. Her incisions are healed nicely. She is almost at the size she would like.  We placed injectable saline in the Expander using a sterile technique: Right:50cc for a total of 450/ 375cc Left:30 cc for a total of 480/ 375cc  Did not expand the left > 30 cc due to the left breast skin being very tight over the expander. May be able to expand 20-50 cc more at her next visit in 2 weeks, but will re-evaluate at that time.  Recommend taking valium tonight due to b/l fill and skin tightness of the left side.  She is scheduled for follow up in 2 weeks for additional fill and then surgery on 04/02/19.     Kermit Balo Fabianna Keats, PA-C 02/18/2019, 8:20 AM

## 2019-02-18 ENCOUNTER — Ambulatory Visit: Payer: BC Managed Care – PPO | Attending: Plastic Surgery | Admitting: Physical Therapy

## 2019-02-18 ENCOUNTER — Encounter: Payer: Self-pay | Admitting: Physical Therapy

## 2019-02-18 ENCOUNTER — Other Ambulatory Visit: Payer: Self-pay

## 2019-02-18 ENCOUNTER — Ambulatory Visit (INDEPENDENT_AMBULATORY_CARE_PROVIDER_SITE_OTHER): Payer: BC Managed Care – PPO | Admitting: Surgical

## 2019-02-18 ENCOUNTER — Encounter: Payer: Self-pay | Admitting: Surgical

## 2019-02-18 VITALS — BP 116/77 | HR 61 | Temp 98.4°F | Ht 67.0 in | Wt 184.0 lb

## 2019-02-18 DIAGNOSIS — M25511 Pain in right shoulder: Secondary | ICD-10-CM

## 2019-02-18 DIAGNOSIS — C50411 Malignant neoplasm of upper-outer quadrant of right female breast: Secondary | ICD-10-CM

## 2019-02-18 DIAGNOSIS — Z9882 Breast implant status: Secondary | ICD-10-CM

## 2019-02-18 DIAGNOSIS — M25512 Pain in left shoulder: Secondary | ICD-10-CM | POA: Diagnosis not present

## 2019-02-18 DIAGNOSIS — Z9889 Other specified postprocedural states: Secondary | ICD-10-CM

## 2019-02-18 DIAGNOSIS — Z170 Estrogen receptor positive status [ER+]: Secondary | ICD-10-CM

## 2019-02-18 DIAGNOSIS — C50412 Malignant neoplasm of upper-outer quadrant of left female breast: Secondary | ICD-10-CM

## 2019-02-18 NOTE — Therapy (Signed)
[facility] [address] [place], [address] Phone: [phone]   Fax:  [phone]  Physical Therapy Treatment  Patient Details  Name: Denise [name] MRN: [id number] Date of Birth: 12-14-1975 Referring Provider (PT): Dillingham   Encounter Date: 02/18/2019  PT End of Session - 02/19/19 0803    Visit Number  19    Number of Visits  33    Date for PT Re-Evaluation  02/21/19    PT Start Time  0500    PT Stop Time  0545    PT Time Calculation (min)  45 min    Activity Tolerance  Patient tolerated treatment well    Behavior During Therapy  [facility] for tasks assessed/performed       Past Medical History:  Diagnosis Date  . Abnormal breast biopsy    PASH, [name]  . Breast cancer ([place]) 11/2017   left breast; ER/PR/Her2neu POS  . Family history of breast cancer   . Family history of ovarian cancer   . Fibroadenoma of breast, left 2000, 2016  . Genetic screening 08/26/2013   BRCA/BART negative/Myriad, CHEK2 POS 2017  . Increased risk of breast cancer 2017   IBIS=47%  . Left breast lump 06/18/2017   Fluid/drained [name]  . Migraine   . Monoallelic mutation of CHEK2 gene in female patient 2017   increased risk of breast and colon cancer  . Personal history of chemotherapy   . Personal history of radiation therapy     Past Surgical History:  Procedure Laterality Date  . BREAST BIOPSY Bilateral 09/08/14   neg  . BREAST BIOPSY Left 03/16/2015   Procedure: BREAST BIOPSY WITH NEEDLE LOCALIZATION;  Surgeon: [name], MD;  Location: [facility] ORS;  Service: General;  Laterality: Left;  . BREAST BIOPSY Left 11/2017   positive  . BREAST CYST ASPIRATION Left 06/18/2017   cyst aspiration  . BREAST EXCISIONAL BIOPSY Left 09/2014  . BREAST EXCISIONAL BIOPSY Right    age 73's  . BREAST RECONSTRUCTION WITH PLACEMENT OF TISSUE EXPANDER AND FLEX HD (ACELLULAR HYDRATED DERMIS) Left 05/20/2018   Procedure:  BREAST RECONSTRUCTION WITH PLACEMENT OF TISSUE EXPANDER AND FLEX HD (ACELLULAR HYDRATED DERMIS);  Surgeon: [name], DO;  Location: [facility]S;  Service: Plastics;  Laterality: Left;  . BREAST RECONSTRUCTION WITH PLACEMENT OF TISSUE EXPANDER AND FLEX HD (ACELLULAR HYDRATED DERMIS) Right 12/09/2018   Procedure: BREAST RECONSTRUCTION WITH PLACEMENT OF TISSUE EXPANDER AND FLEX HD (ACELLULAR HYDRATED DERMIS);  Surgeon: [name], DO;  Location: [facility] ORS;  Service: Plastics;  Laterality: Right;  . BREAST SURGERY Right March 2000   benign fibroadenoma  . BREAST SURGERY Left 08/08/13   excision  . BREAST SURGERY Left 09/23/14   excision  . ESOPHAGOGASTRODUODENOSCOPY (EGD) WITH PROPOFOL N/A 01/20/2019   Procedure: ESOPHAGOGASTRODUODENOSCOPY (EGD) WITH PROPOFOL;  Surgeon: [name], MD;  Location: [facility];  Service: Gastroenterology;  Laterality: N/A;  . MASTECTOMY Left 05/20/2018  . MASTECTOMY W/ SENTINEL NODE BIOPSY Left 05/20/2018   Procedure: MASTECTOMY WITH SENTINEL LYMPH NODE BIOPSY;  Surgeon: [name], MD;  Location: [facility];  Service: General;  Laterality: Left;  . PORTACATH PLACEMENT Right 12/17/2017   Procedure: INSERTION PORT-A-CATH;  Surgeon: [name], MD;  Location: [facility];  Service: General;  Laterality: Right;  . SIMPLE MASTECTOMY WITH AXILLARY SENTINEL NODE BIOPSY Right 12/09/2018   Procedure: SIMPLE MASTECTOMY RIGHT;  Surgeon: [name], MD;  Location: [facility]  ORS;  Service: General;  Laterality: Right;    There were no vitals filed for this visit.  Subjective Assessment - 02/18/19 1703    Subjective  Patinet had a fill today 50cc R 30cc L. Feels tight on both sides, reports R side feels moretight "underneath".    Pertinent History  Patient is a 43 year old female with breast cancer history beginning in 2016 with multiple benign lumpectomies. Patient reports first malignant finding this past June 2019 in L breast, with following  masectomy with saline expander placed 05/20/18. Patient reports chemo prior with radiation beginning next week 5days/week, for 6 weeks. Patient reports she is also planning to have R masectomy with saline expansion in the future, unscheduled. 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 Patient reports she is a mother of a 9 year old and 37 year old that she reports are very involved in extra-curriculars. Patient reports her ROM in the shoulder is improving, but that  she is having some pain in the inside elbow and into medial forearm with straightening. Patient reports some TTP at incision site, but most all pain at elbow into forearm. Worst pain in past week 7/10 and best 0/10.  Reports pain is sharp in the medial elbow and forearm; denies numbness or tingling    Limitations  House hold activities;Lifting    How long can you sit comfortably?  unlimited    How long can you stand comfortably?  unlimited    How long can you walk comfortably?  unlimited    Patient Stated Goals  Decrease pain    Pain Onset  1 to 4 weeks ago       Manual STM withtrigger point releaseto bilat pec minor/major, latissimus/teres minor,intercostals, obliques, L bicep with roller PROM into all planes33mn each UE in flex, abd, IR, and ER inc ROM as able(focus on ER) GHJAPgrade III 30sec bouts 6bouts, and with ER for 6 additional bouts, bilat UE with full ER following, approx 50d LLE and 60d RLE before Increased ROM throughout manual techniques and decreased pain noted from patient following.  Ther-Ex -Supine thoracic ext over foam roll hands behind head x15 3sec holds, with bar overhead x15 3sec hold - Doorway pec stretch 2x 142m hold - HEP review for maintenance as session was manual focused today d/t bilat fill                         PT Education - 02/19/19 0803    Education Details  HEP reinforcement, exercise form    Person(s) Educated  Patient    Methods   Explanation;Demonstration;Verbal cues    Comprehension  Verbalized understanding;Returned demonstration;Verbal cues required       PT Short Term Goals - 06/21/18 1143      PT SHORT TERM GOAL #1   Title  Pt will be independent with HEP in order to improve strength and motion, and to decrease pain in order to improve pain-free function at home and work.    Time  4    Period  Weeks    Status  New        PT Long Term Goals - 12/27/18 1049      PT LONG TERM GOAL #1   Title  Pt will decrease worst pain as reported on NPRS by at least 3 points in order to demonstrate clinically significant reduction in pain.    Baseline  12/27/18 worst pain 6/10  following RUE expander placement    Time  8    Period  Weeks    Status  Revised      PT LONG TERM GOAL #2   Title  Patient will increase FOTO score to 72 to demonstrate predicted increase in functional mobility to complete ADLs    Baseline  10/24/18     Time  8      PT LONG TERM GOAL #3   Title  Patient will demonstrate all active, painfree shoulder ROM within normal limits in order to complete ADLs    Baseline  12/27/18 flex:R: 138d L: 145d with "stretch pain" at axilla Abd: R: 95d; L: 170d with pain directly under breast and at edge of extender; IR T10; ER C8    Time  8    Period  Weeks    Status  Revised            Plan - 02/19/19 1108    Clinical Impression Statement  Patient with increased trigger points, muscle tension, and motion restriction following expander fill today. Patient requires increased manual techniques to resolve this, but has full motion, decreased tension, and subjectively reports feeling less restricted following. PT followed manual techniques with active stretching. Will continue progression as able, pain management as needed.    Stability/Clinical Decision Making  Evolving/Moderate complexity    Clinical Decision Making  Moderate    Rehab Potential  Good    Clinical Impairments Affecting Rehab Potential  (+)  age, motivation, social support, (-) current sedentary lifestyle, ongoing cancer treatment, other comorbidities    PT Frequency  2x / week    PT Duration  8 weeks    PT Treatment/Interventions  ADLs/Self Care Home Management;Aquatic Therapy;Electrical Stimulation;Cryotherapy;Moist Heat;Iontophoresis 2m/ml Dexamethasone;Functional mobility training;Therapeutic activities;Therapeutic exercise;Traction;Ultrasound;Neuromuscular re-education;Manual techniques;Patient/family education;Manual lymph drainage;Passive range of motion;Dry needling;Energy conservation;Taping    PT Next Visit Plan  Ease soft tissue restrictions, increase ROM as able    PT Home Exercise Plan  pulleys shoulder abd/flex, sidelying lat stretch, supine ER stretch, scar massage    Consulted and Agree with Plan of Care  Patient       Patient will benefit from skilled therapeutic intervention in order to improve the following deficits and impairments:  Decreased activity tolerance, Decreased endurance, Decreased range of motion, Decreased skin integrity, Hypomobility, Increased fascial restricitons, Impaired UE functional use, Improper body mechanics, Pain, Postural dysfunction, Impaired flexibility, Decreased scar mobility  Visit Diagnosis: Acute pain of left shoulder  Acute pain of right shoulder     Problem List Patient Active Problem List   Diagnosis Date Noted  . Iron deficiency anemia due to chronic blood loss 02/14/2019  . Melena   . Epistaxis 01/08/2019  . Acquired absence of right breast 12/17/2018  . Admission for breast reconstruction following mastectomy 12/09/2018  . Use of tamoxifen (Nolvadex) 12/04/2018  . Acquired absence of left breast 09/03/2018  . Visual changes 08/20/2018  . Breast asymmetry following reconstructive surgery 08/13/2018  . Hot flashes due to menopause 06/18/2018  . Status post left mastectomy 06/04/2018  . S/P breast reconstruction 05/28/2018  . Vasomotor symptoms due to menopause  04/17/2018  . Elevated LFTs 03/17/2018  . Goals of care, counseling/discussion 03/17/2018  . Hypomagnesemia 03/04/2018  . Diarrhea 01/02/2018  . Encounter for antineoplastic chemotherapy 12/21/2017  . Encounter for antineoplastic immunotherapy 12/21/2017  . 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 Breast mass, right 12/05/2016  . Monoallelic mutation of CHEK2 gene in female patient 06/20/2015  .  Malignant neoplasm of upper-outer quadrant of female breast ([place]) 09/17/2014   Shelton Silvas PT, DPT Shelton Silvas 02/19/2019, 11:16 AM  [facility] [address] [place], [address] Phone: [phone]   Fax:  [phone]  Name: Denise [name] MRN: [id number] Date of Birth: 16-Dec-1975

## 2019-02-20 ENCOUNTER — Ambulatory Visit: Payer: BC Managed Care – PPO | Admitting: Physical Therapy

## 2019-02-20 ENCOUNTER — Encounter: Payer: Self-pay | Admitting: Physical Therapy

## 2019-02-20 ENCOUNTER — Other Ambulatory Visit: Payer: Self-pay

## 2019-02-20 DIAGNOSIS — M25512 Pain in left shoulder: Secondary | ICD-10-CM | POA: Diagnosis not present

## 2019-02-20 DIAGNOSIS — M25511 Pain in right shoulder: Secondary | ICD-10-CM

## 2019-02-20 NOTE — Therapy (Signed)
[name] [address] [place], [address] Phone: [phone]   Fax:  [phone]  Physical Therapy Treatment  Patient Details  Name: Denise [name] MRN: [id number] Date of Birth: [birth year] Referring Provider (PT): Dillingham   Encounter Date: 02/20/2019  PT End of Session - 02/20/19 1624    Visit Number  20    Number of Visits  49    Date for PT Re-Evaluation  04/18/19    PT Start Time  0400    PT Stop Time  0440    PT Time Calculation (min)  40 min    Activity Tolerance  Patient tolerated treatment well    Behavior During Therapy  [facility] for tasks assessed/performed       Past Medical History:  Diagnosis Date  . Abnormal breast biopsy    PASH, [name]  . Breast cancer ([place]) 11/2017   left breast; ER/PR/Her2neu POS  . Family history of breast cancer   . Family history of ovarian cancer   . Fibroadenoma of breast, left 2000, 2016  . Genetic screening 08/26/2013   BRCA/BART negative/Myriad, CHEK2 POS 2017  . Increased risk of breast cancer 2017   IBIS=47%  . Left breast lump 06/18/2017   Fluid/drained [name]  . Migraine   . Monoallelic mutation of CHEK2 gene in female patient 2017   increased risk of breast and colon cancer  . Personal history of chemotherapy   . Personal history of radiation therapy     Past Surgical History:  Procedure Laterality Date  . BREAST BIOPSY Bilateral 09/08/14   neg  . BREAST BIOPSY Left 03/16/2015   Procedure: BREAST BIOPSY WITH NEEDLE LOCALIZATION;  Surgeon: [name], MD;  Location: [facility];  Service: General;  Laterality: Left;  . BREAST BIOPSY Left 11/2017   positive  . BREAST CYST ASPIRATION Left 06/18/2017   cyst aspiration  . BREAST EXCISIONAL BIOPSY Left 09/2014  . BREAST EXCISIONAL BIOPSY Right    age 59's  . BREAST RECONSTRUCTION WITH PLACEMENT OF TISSUE EXPANDER AND FLEX HD (ACELLULAR HYDRATED DERMIS) Left 05/20/2018   Procedure:  BREAST RECONSTRUCTION WITH PLACEMENT OF TISSUE EXPANDER AND FLEX HD (ACELLULAR HYDRATED DERMIS);  Surgeon: [name], DO;  Location: [facility];  Service: Plastics;  Laterality: Left;  . BREAST RECONSTRUCTION WITH PLACEMENT OF TISSUE EXPANDER AND FLEX HD (ACELLULAR HYDRATED DERMIS) Right 12/09/2018   Procedure: BREAST RECONSTRUCTION WITH PLACEMENT OF TISSUE EXPANDER AND FLEX HD (ACELLULAR HYDRATED DERMIS);  Surgeon: [name], DO;  Location: [facility];  Service: Plastics;  Laterality: Right;  . BREAST SURGERY Right March 2000   benign fibroadenoma  . BREAST SURGERY Left 08/08/13   excision  . BREAST SURGERY Left 09/23/14   excision  . ESOPHAGOGASTRODUODENOSCOPY (EGD) WITH PROPOFOL N/A 01/20/2019   Procedure: ESOPHAGOGASTRODUODENOSCOPY (EGD) WITH PROPOFOL;  Surgeon: [name], MD;  Location: [facility];  Service: Gastroenterology;  Laterality: N/A;  . MASTECTOMY Left 05/20/2018  . MASTECTOMY W/ SENTINEL NODE BIOPSY Left 05/20/2018   Procedure: MASTECTOMY WITH SENTINEL LYMPH NODE BIOPSY;  Surgeon: [name], MD;  Location: [facility];  Service: General;  Laterality: Left;  . PORTACATH PLACEMENT Right 12/17/2017   Procedure: INSERTION PORT-A-CATH;  Surgeon: [name], MD;  Location: [facility];  Service: General;  Laterality: Right;  . SIMPLE MASTECTOMY WITH AXILLARY SENTINEL NODE BIOPSY Right 12/09/2018   Procedure: SIMPLE MASTECTOMY RIGHT;  Surgeon: [name], MD;  Location: [facility]  ORS;  Service: General;  Laterality: Right;    There were no vitals filed for this visit.  Subjective Assessment - 02/20/19 1604    Subjective  Patient reports she feels better today, better motion. Some tension at L lat area. 1/10 pain.    Pertinent History  Patient is a 43 year old female with breast cancer history beginning in 2016 with multiple benign lumpectomies. Patient reports first malignant finding this past June 2019 in L breast, with following masectomy with  saline expander placed 05/20/18. Patient reports chemo prior with radiation beginning next week 5days/week, for 6 weeks. Patient reports she is also planning to have R masectomy with saline expansion in the future, unscheduled. 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 Patient reports her ROM in the shoulder is improving, but that  she is having some pain in the inside elbow and into medial forearm with straightening. Patient reports some TTP at incision site, but most all pain at elbow into forearm. Worst pain in past week 7/10 and best 0/10.  Reports pain is sharp in the medial elbow and forearm; denies numbness or tingling    Limitations  House hold activities;Lifting    How long can you sit comfortably?  unlimited    How long can you stand comfortably?  unlimited    How long can you walk comfortably?  unlimited    Patient Stated Goals  Decrease pain         Manual STM withtrigger point releaseTo L pec minor/major, latissimus/teres minor, PROM into all planes66mn in flex, abd, IR, and ER inc ROM as able(focus on ER) GHJAPgrade III 30sec bouts 6bouts, and with ER for 6 additional bouts, bilat UE with full ER following, approx 50d LLE and 60d RLE before Increased ROM throughout manual techniques and decreased pain noted from patient following.  Ther-Ex - Plank taps with YTB, 2# DB for wrist comfort 3x 8 taps each LE, min cuing for scapular stabilization with good carry over following - Scapular push up on bosu ball 3x 8 with demo and min cuing for set up with good carry over following - Prone thoracic ext with hands behind head 3x 10 cuing for full avaible ROM with good carry over following - Lat pulldown 35# x10; 45# 2x 10 with good carry over of proper form following cuing.                          PT Education -  02/20/19 1620    Education Details  therex form, POC updates    Person(s) Educated  Patient    Methods  Explanation;Demonstration;Tactile cues;Verbal cues    Comprehension  Verbalized understanding;Returned demonstration;Verbal cues required;Tactile cues required       PT Short Term Goals - 02/20/19 1624      PT SHORT TERM GOAL #1   Title  Pt will be independent with HEP in order to improve strength and motion, and to decrease pain in order to improve pain-free function at home and work.    Baseline  Completes HEP without questions or concerns    Time  4    Period  Weeks    Status  Achieved        PT Long Term Goals - 02/20/19 1625      PT LONG  TERM GOAL #1   Title  Pt will decrease worst pain as reported on NPRS by at least 3 points in order to demonstrate clinically significant reduction in pain.    Baseline  02/20/19 2/10    Time  8    Period  Weeks    Status  Achieved      PT LONG TERM GOAL #2   Title  Patient will increase FOTO score to 72 to demonstrate predicted increase in functional mobility to complete ADLs    Baseline  02/20/19    Time  8    Period  Weeks    Status  Achieved      PT LONG TERM GOAL #3   Title  Patient will demonstrate all active, painfree shoulder ROM within normal limits in order to complete ADLs    Baseline  02/20/19 R IR to T12 and full but "tense" with R shoulder abd    Time  8    Period  Weeks    Status  On-going      PT LONG TERM GOAL #4   Title  Pt will increase strength by at least 1/2 MMT grade in order to demonstrate improvement in strength and function    Baseline  02/20/19: R/L Y bilat T 4 bilat  Lats 4- bilat    Time  8    Period  Weeks    Status  New            Plan - 02/20/19 1648    Clinical Impression Statement  Patient continues to respond well to manual therapy techniques for increased motion and decreased muscle tension. Patinet is able to complete all therex with proper form following cuing and demonstration. Goals were  reassesed this date, with patinet continuing to make strong progress after bilat expansion. Continues with deficits in periscapular strength related to unbalanced length tension relationship d/t surgical sites. Pt will continue to benefit from skilled PT to address these deficits 1x/week with robust HEP carry over and resume 2x/week once patient completes exchange surgery 10/12. Patient verbalizes understanding of this.    Stability/Clinical Decision Making  Evolving/Moderate complexity    Clinical Decision Making  Moderate    Rehab Potential  Good    Clinical Impairments Affecting Rehab Potential  (+) age, motivation, social support, (-) current sedentary lifestyle, ongoing cancer treatment, other comorbidities    PT Frequency  2x / week    PT Duration  8 weeks    PT Treatment/Interventions  ADLs/Self Care Home Management;Aquatic Therapy;Electrical Stimulation;Cryotherapy;Moist Heat;Iontophoresis 69m/ml Dexamethasone;Functional mobility training;Therapeutic activities;Therapeutic exercise;Traction;Ultrasound;Neuromuscular re-education;Manual techniques;Patient/family education;Manual lymph drainage;Passive range of motion;Dry needling;Energy conservation;Taping    PT Next Visit Plan  Ease soft tissue restrictions, increase ROM as able    PT Home Exercise Plan  pulleys shoulder abd/flex, sidelying lat stretch, supine ER stretch, scar massage    Consulted and Agree with Plan of Care  Patient       Patient will benefit from skilled therapeutic intervention in order to improve the following deficits and impairments:  Decreased activity tolerance, Decreased endurance, Decreased range of motion, Decreased skin integrity, Hypomobility, Increased fascial restricitons, Impaired UE functional use, Improper body mechanics, Pain, Postural dysfunction, Impaired flexibility, Decreased scar mobility  Visit Diagnosis: No diagnosis found.     Problem List Patient Active Problem List   Diagnosis Date Noted   . Iron deficiency anemia due to chronic blood loss 02/14/2019  . Melena   . Epistaxis 01/08/2019  . Acquired absence of right breast  12/17/2018  . Admission for breast reconstruction following mastectomy 12/09/2018  . Use of tamoxifen (Nolvadex) 12/04/2018  . Acquired absence of left breast 09/03/2018  . Visual changes 08/20/2018  . Breast asymmetry following reconstructive surgery 08/13/2018  . Hot flashes due to menopause 06/18/2018  . Status post left mastectomy 06/04/2018  . S/P breast reconstruction 05/28/2018  . Vasomotor symptoms due to menopause 04/17/2018  . Elevated LFTs 03/17/2018  . Goals of care, counseling/discussion 03/17/2018  . Hypomagnesemia 03/04/2018  . Diarrhea 01/02/2018  . Encounter for antineoplastic chemotherapy 12/21/2017  . Encounter for antineoplastic immunotherapy 12/21/2017  . 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 Breast mass, right 12/05/2016  . Monoallelic mutation of CHEK2 gene in female patient 06/20/2015  . Malignant neoplasm of upper-outer quadrant of female breast ([place]) 09/17/2014   Shelton Silvas PT, DPT Shelton Silvas 02/20/2019, 4:59 PM  [facility] [address], [place], [address] Phone: [phone]   Fax:  [phone]  Name: RAEGAN WINDERS MRN: [id number] Date of Birth: 1976/01/31

## 2019-02-25 ENCOUNTER — Ambulatory Visit: Payer: BC Managed Care – PPO | Admitting: Physical Therapy

## 2019-02-27 ENCOUNTER — Ambulatory Visit: Payer: BC Managed Care – PPO | Admitting: Physical Therapy

## 2019-03-04 ENCOUNTER — Other Ambulatory Visit: Payer: Self-pay

## 2019-03-04 ENCOUNTER — Encounter: Payer: Self-pay | Admitting: Surgical

## 2019-03-04 ENCOUNTER — Encounter: Payer: BC Managed Care – PPO | Admitting: Physical Therapy

## 2019-03-04 ENCOUNTER — Ambulatory Visit (INDEPENDENT_AMBULATORY_CARE_PROVIDER_SITE_OTHER): Payer: BC Managed Care – PPO | Admitting: Surgical

## 2019-03-04 VITALS — BP 125/83 | HR 81 | Temp 97.7°F | Ht 67.0 in | Wt 186.0 lb

## 2019-03-04 DIAGNOSIS — C50412 Malignant neoplasm of upper-outer quadrant of left female breast: Secondary | ICD-10-CM

## 2019-03-04 DIAGNOSIS — C50411 Malignant neoplasm of upper-outer quadrant of right female breast: Secondary | ICD-10-CM

## 2019-03-04 DIAGNOSIS — Z170 Estrogen receptor positive status [ER+]: Secondary | ICD-10-CM

## 2019-03-04 DIAGNOSIS — Z9889 Other specified postprocedural states: Secondary | ICD-10-CM

## 2019-03-04 DIAGNOSIS — Z9882 Breast implant status: Secondary | ICD-10-CM

## 2019-03-04 NOTE — Progress Notes (Signed)
Subjective:     Patient ID: Denise [name], female    DOB: [birth year], 43 y.o.   MRN: [id number]  Chief Complaint  Patient presents with  . Follow-up    2 weeks for breast reconstruction    HPI: The patient is a 43 y.o. female here for follow-up on breast reconstruction.   She is doing well. She is happy with the current size of her breasts and is ready for exchange.  No complaints. No n/v/fever/chills. Incisions c/d/i. She tolerated last fill with no issues.  Review of Systems  Constitutional: Negative for chills, diaphoresis, fever, malaise/fatigue and weight loss.  Respiratory: Negative.   Cardiovascular: Negative.   Genitourinary: Negative.   Musculoskeletal: Negative.   Skin: Negative for itching and rash.  Neurological: Negative.   Psychiatric/Behavioral: Negative.      Objective:   Vital Signs BP 125/83 (BP Location: Right Arm, Patient Position: Sitting, Cuff Size: Normal)   Pulse 81   Temp 97.7 F (36.5 C) (Temporal)   Ht 5\' 7"  (1.702 m)   Wt 186 lb (84.4 kg)   LMP 12/05/2018 (Exact Date)   SpO2 99%   BMI 29.13 kg/m  Vital Signs and Nursing Note Reviewed Chaperone present Physical Exam  Constitutional: She is oriented to person, place, and time and well-developed, well-nourished, and in no distress.  HENT:  Head: Normocephalic and atraumatic.  Cardiovascular: Normal rate.  Pulmonary/Chest: Effort normal.    Musculoskeletal: Normal range of motion.  Neurological: She is alert and oriented to person, place, and time. Gait normal.  Skin: Skin is warm and dry. No rash noted. She is not diaphoretic. No erythema. No pallor.  Psychiatric: Mood and affect normal.      Assessment/Plan:     ICD-10-CM   1. S/P breast reconstruction  Z98.82   2. Malignant neoplasm of upper-outer quadrant of right female breast, unspecified estrogen receptor status (HCC)  C50.411   3. Malignant neoplasm of upper-outer quadrant of left breast in female, estrogen receptor  positive (HCC)  C50.412    Z17.0     Denise [name] is doing well. She is scheduled for pre-op evaluation on 03/14/19 with Dr. Ulice Bold  We placed injectable saline in the Expander using a sterile technique: Right:0cc for a total of450/ 375cc Left:30 cc for a total of 510/ 375cc  Patient is scheduled for exchange surgery 03/31/19.  Kermit Balo Bijou Easler, PA-C 03/04/2019, 8:29 AM

## 2019-03-05 ENCOUNTER — Encounter: Payer: Self-pay | Admitting: Plastic Surgery

## 2019-03-06 ENCOUNTER — Ambulatory Visit: Payer: BC Managed Care – PPO | Admitting: Physical Therapy

## 2019-03-06 ENCOUNTER — Other Ambulatory Visit: Payer: Self-pay

## 2019-03-06 ENCOUNTER — Encounter: Payer: Self-pay | Admitting: Physical Therapy

## 2019-03-06 DIAGNOSIS — M25512 Pain in left shoulder: Secondary | ICD-10-CM | POA: Diagnosis not present

## 2019-03-06 DIAGNOSIS — M25511 Pain in right shoulder: Secondary | ICD-10-CM

## 2019-03-06 NOTE — Therapy (Signed)
[facility] [address], [place], [address] Phone: [phone]   Fax:  [phone]  Physical Therapy Treatment  Patient Details  Name: Denise [name] MRN: [id number] Date of Birth: May 24, 1976 Referring Provider (PT): Dillingham   Encounter Date: 03/06/2019  PT End of Session - 03/06/19 1703    Visit Number  21    Number of Visits  49    Date for PT Re-Evaluation  04/18/19    PT Start Time  0415    PT Stop Time  0500    PT Time Calculation (min)  45 min    Activity Tolerance  Patient tolerated treatment well    Behavior During Therapy  [facility] for tasks assessed/performed       Past Medical History:  Diagnosis Date  . Abnormal breast biopsy    PASH, [name]  . Breast cancer ([place]) 11/2017   left breast; ER/PR/Her2neu POS  . Family history of breast cancer   . Family history of ovarian cancer   . Fibroadenoma of breast, left 2000, 2016  . Genetic screening 08/26/2013   BRCA/BART negative/Myriad, CHEK2 POS 2017  . Increased risk of breast cancer 2017   IBIS=47%  . Left breast lump 06/18/2017   Fluid/drained [name]  . Migraine   . Monoallelic mutation of CHEK2 gene in female patient 2017   increased risk of breast and colon cancer  . Personal history of chemotherapy   . Personal history of radiation therapy     Past Surgical History:  Procedure Laterality Date  . BREAST BIOPSY Bilateral 09/08/14   neg  . BREAST BIOPSY Left 03/16/2015   Procedure: BREAST BIOPSY WITH NEEDLE LOCALIZATION;  Surgeon: [name], MD;  Location: [facility];  Service: General;  Laterality: Left;  . BREAST BIOPSY Left 11/2017   positive  . BREAST CYST ASPIRATION Left 06/18/2017   cyst aspiration  . BREAST EXCISIONAL BIOPSY Left 09/2014  . BREAST EXCISIONAL BIOPSY Right    age 46's  . BREAST RECONSTRUCTION WITH PLACEMENT OF TISSUE EXPANDER AND FLEX HD (ACELLULAR HYDRATED DERMIS) Left 05/20/2018   Procedure:  BREAST RECONSTRUCTION WITH PLACEMENT OF TISSUE EXPANDER AND FLEX HD (ACELLULAR HYDRATED DERMIS);  Surgeon: [name], DO;  Location: [facility];  Service: Plastics;  Laterality: Left;  . BREAST RECONSTRUCTION WITH PLACEMENT OF TISSUE EXPANDER AND FLEX HD (ACELLULAR HYDRATED DERMIS) Right 12/09/2018   Procedure: BREAST RECONSTRUCTION WITH PLACEMENT OF TISSUE EXPANDER AND FLEX HD (ACELLULAR HYDRATED DERMIS);  Surgeon: [name], DO;  Location: [facility];  Service: Plastics;  Laterality: Right;  . BREAST SURGERY Right March 2000   benign fibroadenoma  . BREAST SURGERY Left 08/08/13   excision  . BREAST SURGERY Left 09/23/14   excision  . ESOPHAGOGASTRODUODENOSCOPY (EGD) WITH PROPOFOL N/A 01/20/2019   Procedure: ESOPHAGOGASTRODUODENOSCOPY (EGD) WITH PROPOFOL;  Surgeon: [name], MD;  Location: [place];  Service: Gastroenterology;  Laterality: N/A;  . MASTECTOMY Left 05/20/2018  . MASTECTOMY W/ SENTINEL NODE BIOPSY Left 05/20/2018   Procedure: MASTECTOMY WITH SENTINEL LYMPH NODE BIOPSY;  Surgeon: [name], MD;  Location: [facility];  Service: General;  Laterality: Left;  . PORTACATH PLACEMENT Right 12/17/2017   Procedure: INSERTION PORT-A-CATH;  Surgeon: [name], MD;  Location: [facility];  Service: General;  Laterality: Right;  . SIMPLE MASTECTOMY WITH AXILLARY SENTINEL NODE BIOPSY Right 12/09/2018   Procedure: SIMPLE MASTECTOMY RIGHT;  Surgeon: [name], MD;  Location: [facility]  ORS;  Service: General;  Laterality: Right;    There were no vitals filed for this visit.  Subjective Assessment - 03/06/19 1619    Subjective  Patinet repots she had a fill in the L expander Tuesday. Which was her last fill. Reports some stiffness in this shoulder/chest, 2/10 pain on L side only.    Pertinent History  Patient is a 43 year old female with breast cancer history beginning in 2016 with multiple benign lumpectomies. Patient reports first malignant finding this  past June 2019 in L breast, with following masectomy with saline expander placed 05/20/18. Patient reports chemo prior with radiation beginning next week 5days/week, for 6 weeks. Patient reports she is also planning to have R masectomy with saline expansion in the future, unscheduled. 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 Patient reports she is a mother of a 17 year old and 53 year old that she reports are very involved in extra-curriculars. Patient reports her ROM in the shoulder is improving, but that  she is having some pain in the inside elbow and into medial forearm with straightening. Patient reports some TTP at incision site, but most all pain at elbow into forearm. Worst pain in past week 7/10 and best 0/10.  Reports pain is sharp in the medial elbow and forearm; denies numbness or tingling    Limitations  House hold activities;Lifting    How long can you sit comfortably?  unlimited    How long can you stand comfortably?  unlimited    How long can you walk comfortably?  unlimited    Patient Stated Goals  Decrease pain    Pain Onset  1 to 4 weeks ago         Manual STM withtrigger point releaseTo L pec minor/major, latissimus/teres minor, PROM into all planes71mn in flex, abd, IR, and ER inc ROM as able(focus on ER) Increased ROM throughout manual techniques and decreased pain noted from patient following.  Ther-Ex - 1/2 foam roll pec stretch 13m hold - Plank tap forward and back 3x 10/9/8 3# DB for wrist comfort, min cuing for proper form with good carry over following - Small body blad x15sec; large 3x 15sec in flex and abd bilat with min cuing for shoulder position on set up - Dead hang 3x 5sec hold with good carry over following demo - Seated thoracic ext in 1/2 foam with hands behind head x10 with 3sec hold   PT Education - 03/06/19 1640    Education Details  therex form    Person(s) Educated  Patient    Methods   Explanation;Demonstration;Verbal cues    Comprehension  Verbalized understanding;Returned demonstration;Verbal cues required       PT Short Term Goals - 02/20/19 1624      PT SHORT TERM GOAL #1   Title  Pt will be independent with HEP in order to improve strength and motion, and to decrease pain in order to improve pain-free function at home and work.    Baseline  Completes HEP without questions or concerns    Time  4    Period  Weeks    Status  Achieved        PT Long Term Goals - 02/20/19 1625      PT LONG TERM GOAL #1   Title  Pt will decrease worst pain as reported on NPRS by at least 3 points in order to demonstrate clinically significant reduction in pain.    Baseline  02/20/19 2/10    Time  8    Period  Weeks    Status  Achieved      PT LONG TERM GOAL #2   Title  Patient will increase FOTO score to 72 to demonstrate predicted increase in functional mobility to complete ADLs    Baseline  02/20/19    Time  8    Period  Weeks    Status  Achieved      PT LONG TERM GOAL #3   Title  Patient will demonstrate all active, painfree shoulder ROM within normal limits in order to complete ADLs    Baseline  02/20/19 R IR to T12 and full but "tense" with R shoulder abd    Time  8    Period  Weeks    Status  On-going      PT LONG TERM GOAL #4   Title  Pt will increase strength by at least 1/2 MMT grade in order to demonstrate improvement in strength and function    Baseline  02/20/19: R/L Y bilat T 4 bilat  Lats 4- bilat    Time  8    Period  Weeks    Status  New            Plan - 03/06/19 1706    Clinical Impression Statement  Pt continues to respond well to manual techniques with increased motion following. PT continued therex progression for increased scapular strengthening and stability with good success, paien is able o completewith good accuracy following demo and cuing, and is motivated throughout session. PT will continue progression as able.    Stability/Clinical  Decision Making  Evolving/Moderate complexity    Clinical Decision Making  Moderate    Rehab Potential  Good    Clinical Impairments Affecting Rehab Potential  (+) age, motivation, social support, (-) current sedentary lifestyle, ongoing cancer treatment, other comorbidities    PT Frequency  2x / week    PT Duration  8 weeks    PT Treatment/Interventions  ADLs/Self Care Home Management;Aquatic Therapy;Electrical Stimulation;Cryotherapy;Moist Heat;Iontophoresis 29m/ml Dexamethasone;Functional mobility training;Therapeutic activities;Therapeutic exercise;Traction;Ultrasound;Neuromuscular re-education;Manual techniques;Patient/family education;Manual lymph drainage;Passive range of motion;Dry needling;Energy conservation;Taping    PT Next Visit Plan  Ease soft tissue restrictions, increase ROM as able    PT Home Exercise Plan  pulleys shoulder abd/flex, sidelying lat stretch, supine ER stretch, scar massage    Consulted and Agree with Plan of Care  Patient       Patient will benefit from skilled therapeutic intervention in order to improve the following deficits and impairments:  Decreased activity tolerance, Decreased endurance, Decreased range of motion, Decreased skin integrity, Hypomobility, Increased fascial restricitons, Impaired UE functional use, Improper body mechanics, Pain, Postural dysfunction, Impaired flexibility, Decreased scar mobility  Visit Diagnosis: Acute pain of left shoulder  Acute pain of right shoulder     Problem List Patient Active Problem List   Diagnosis Date Noted  . Iron deficiency anemia due to chronic blood loss 02/14/2019  . Melena   . Epistaxis 01/08/2019  . Acquired absence of right breast 12/17/2018  . Admission for breast reconstruction following mastectomy 12/09/2018  . Use of tamoxifen (Nolvadex) 12/04/2018  . Acquired absence of left breast 09/03/2018  . Visual changes 08/20/2018  . Breast asymmetry following reconstructive surgery 08/13/2018  .  Hot flashes due to menopause 06/18/2018  . Status post left mastectomy 06/04/2018  . S/P breast reconstruction 05/28/2018  . Vasomotor symptoms due to menopause 04/17/2018  . Elevated LFTs 03/17/2018  .  Goals of care, counseling/discussion 03/17/2018  . Hypomagnesemia 03/04/2018  . Diarrhea 01/02/2018  . Encounter for antineoplastic chemotherapy 12/21/2017  . Encounter for antineoplastic immunotherapy 12/21/2017  . 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 Breast mass, right 12/05/2016  . Monoallelic mutation of CHEK2 gene in female patient 06/20/2015  . Malignant neoplasm of upper-outer quadrant of female breast ([place]) 09/17/2014   Shelton Silvas PT, DPT Shelton Silvas 03/06/2019, 5:14 PM  [facility] [address], [place], [address] Phone: [phone]   Fax:  [phone]  Name: Denise [name] MRN: [id number] Date of Birth: [birth year]

## 2019-03-11 ENCOUNTER — Encounter: Payer: BC Managed Care – PPO | Admitting: Physical Therapy

## 2019-03-13 ENCOUNTER — Other Ambulatory Visit: Payer: Self-pay

## 2019-03-13 ENCOUNTER — Ambulatory Visit: Payer: BC Managed Care – PPO | Admitting: Physical Therapy

## 2019-03-13 ENCOUNTER — Encounter: Payer: Self-pay | Admitting: Physical Therapy

## 2019-03-13 DIAGNOSIS — M25512 Pain in left shoulder: Secondary | ICD-10-CM | POA: Diagnosis not present

## 2019-03-13 DIAGNOSIS — M25511 Pain in right shoulder: Secondary | ICD-10-CM

## 2019-03-13 NOTE — Therapy (Signed)
[facility] [address] [place], [address] Phone: [phone]   Fax:  [phone]  Physical Therapy Treatment  Patient Details  Name: Denise [name] MRN: [id number] Date of Birth: 1975-06-27 Referring Provider (PT): Dillingham   Encounter Date: 03/13/2019  PT End of Session - 03/13/19 1721    Visit Number  22    Number of Visits  10    Date for PT Re-Evaluation  04/18/19    PT Start Time  0504    PT Stop Time  0542    PT Time Calculation (min)  38 min    Activity Tolerance  Patient tolerated treatment well    Behavior During Therapy  [facility] for tasks assessed/performed       Past Medical History:  Diagnosis Date  . Abnormal breast biopsy    PASH, [name]  . Breast cancer ([place]) 11/2017   left breast; ER/PR/Her2neu POS  . Family history of breast cancer   . Family history of ovarian cancer   . Fibroadenoma of breast, left 2000, 2016  . Genetic screening 08/26/2013   BRCA/BART negative/Myriad, CHEK2 POS 2017  . Increased risk of breast cancer 2017   IBIS=47%  . Left breast lump 06/18/2017   Fluid/drained [name]  . Migraine   . Monoallelic mutation of CHEK2 gene in female patient 2017   increased risk of breast and colon cancer  . Personal history of chemotherapy   . Personal history of radiation therapy     Past Surgical History:  Procedure Laterality Date  . BREAST BIOPSY Bilateral 09/08/14   neg  . BREAST BIOPSY Left 03/16/2015   Procedure: BREAST BIOPSY WITH NEEDLE LOCALIZATION;  Surgeon: [name], MD;  Location: [facility];  Service: General;  Laterality: Left;  . BREAST BIOPSY Left 11/2017   positive  . BREAST CYST ASPIRATION Left 06/18/2017   cyst aspiration  . BREAST EXCISIONAL BIOPSY Left 09/2014  . BREAST EXCISIONAL BIOPSY Right    age 13's  . BREAST RECONSTRUCTION WITH PLACEMENT OF TISSUE EXPANDER AND FLEX HD (ACELLULAR HYDRATED DERMIS) Left 05/20/2018   Procedure:  BREAST RECONSTRUCTION WITH PLACEMENT OF TISSUE EXPANDER AND FLEX HD (ACELLULAR HYDRATED DERMIS);  Surgeon: [name], DO;  Location: [facility];  Service: Plastics;  Laterality: Left;  . BREAST RECONSTRUCTION WITH PLACEMENT OF TISSUE EXPANDER AND FLEX HD (ACELLULAR HYDRATED DERMIS) Right 12/09/2018   Procedure: BREAST RECONSTRUCTION WITH PLACEMENT OF TISSUE EXPANDER AND FLEX HD (ACELLULAR HYDRATED DERMIS);  Surgeon: [name], DO;  Location: [facility];  Service: Plastics;  Laterality: Right;  . BREAST SURGERY Right March 2000   benign fibroadenoma  . BREAST SURGERY Left 08/08/13   excision  . BREAST SURGERY Left 09/23/14   excision  . ESOPHAGOGASTRODUODENOSCOPY (EGD) WITH PROPOFOL N/A 01/20/2019   Procedure: ESOPHAGOGASTRODUODENOSCOPY (EGD) WITH PROPOFOL;  Surgeon: [name], MD;  Location: [facility];  Service: Gastroenterology;  Laterality: N/A;  . MASTECTOMY Left 05/20/2018  . MASTECTOMY W/ SENTINEL NODE BIOPSY Left 05/20/2018   Procedure: MASTECTOMY WITH SENTINEL LYMPH NODE BIOPSY;  Surgeon: [name], MD;  Location: [facility];  Service: General;  Laterality: Left;  . PORTACATH PLACEMENT Right 12/17/2017   Procedure: INSERTION PORT-A-CATH;  Surgeon: [name], MD;  Location: [facility];  Service: General;  Laterality: Right;  . SIMPLE MASTECTOMY WITH AXILLARY SENTINEL NODE BIOPSY Right 12/09/2018   Procedure: SIMPLE MASTECTOMY RIGHT;  Surgeon: [name], MD;  Location: [facility]  ORS;  Service: General;  Laterality: Right;    There were no vitals filed for this visit.  Subjective Assessment - 03/13/19 1706    Subjective  Reports 2/10 pain in L chest/ant shoulder, good motion.    Pertinent History  Patient is a 43 year old female with breast cancer history beginning in 2016 with multiple benign lumpectomies. Patient reports first malignant finding this past June 2019 in L breast, with following masectomy with saline expander placed 05/20/18. Patient  reports chemo prior with radiation beginning next week 5days/week, for 6 weeks. Patient reports she is also planning to have R masectomy with saline expansion in the future, unscheduled. 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 Patient reports her ROM in the shoulder is improving, but that  she is having some pain in the inside elbow and into medial forearm with straightening. Patient reports some TTP at incision site, but most all pain at elbow into forearm. Worst pain in past week 7/10 and best 0/10.  Reports pain is sharp in the medial elbow and forearm; denies numbness or tingling    Limitations  House hold activities;Lifting    How long can you sit comfortably?  unlimited    How long can you stand comfortably?  unlimited    How long can you walk comfortably?  unlimited    Patient Stated Goals  Decrease pain       Manual STM withtrigger point releaseTo L pec minor/major, latissimus/teres minor, PROM into all planes16mn in flex, abd, IR, and ER inc ROM as able(focus on ER) Increased ROM throughout manual techniques and decreased pain noted from patient following.  Ther-Ex - 1/2 foam roll pec stretch 182m hold - Knee plank UE step up onto 6in step (up/up/down/down) 3x 10 min cuing for set up good carry over following demo - ER in 90/90 position with maintained scapular contraction GTB 3x 10 with min cuing to maintain elbow height with good carry over following - Dead hang 3x 5/7/8sec hold with good carry over following last session - Bent over rows 10# 3x 10 with min cuing for set up with good carry over of proper scapular retraction - Seated thoracic ext in 1/2 foam with hands behind head x10 with 3sec hold                         PT Education - 03/13/19 1717    Education Details  therex form    Person(s) Educated   Patient    Methods  Explanation;Demonstration;Verbal cues    Comprehension  Verbalized understanding;Returned demonstration;Verbal cues required       PT Short Term Goals - 02/20/19 1624      PT SHORT TERM GOAL #1   Title  Pt will be independent with HEP in order to improve strength and motion, and to decrease pain in order to improve pain-free function at home and work.    Baseline  Completes HEP without questions or concerns    Time  4    Period  Weeks    Status  Achieved        PT Long Term Goals - 02/20/19 1625      PT LONG TERM GOAL #1   Title  Pt will decrease worst pain as reported on NPRS by at least 3 points  in order to demonstrate clinically significant reduction in pain.    Baseline  02/20/19 2/10    Time  8    Period  Weeks    Status  Achieved      PT LONG TERM GOAL #2   Title  Patient will increase FOTO score to 72 to demonstrate predicted increase in functional mobility to complete ADLs    Baseline  02/20/19    Time  8    Period  Weeks    Status  Achieved      PT LONG TERM GOAL #3   Title  Patient will demonstrate all active, painfree shoulder ROM within normal limits in order to complete ADLs    Baseline  02/20/19 R IR to T12 and full but "tense" with R shoulder abd    Time  8    Period  Weeks    Status  On-going      PT LONG TERM GOAL #4   Title  Pt will increase strength by at least 1/2 MMT grade in order to demonstrate improvement in strength and function    Baseline  02/20/19: R/L Y bilat T 4 bilat  Lats 4- bilat    Time  8    Period  Weeks    Status  New            Plan - 03/13/19 1734    Clinical Impression Statement  Patient continues to respond well to manual techniques with ER PROM increased from 100d to 160 following. PT progressed therex with good success. Patient with good carry over from previous session for proper posture with min cuing needed for therex techniuqe. PT will continue progression and pain modulation as able next week before  pts scheduled reconstruction.    Stability/Clinical Decision Making  Evolving/Moderate complexity    Clinical Decision Making  Moderate    Rehab Potential  Good    Clinical Impairments Affecting Rehab Potential  (+) age, motivation, social support, (-) current sedentary lifestyle, ongoing cancer treatment, other comorbidities    PT Frequency  2x / week    PT Duration  8 weeks    PT Treatment/Interventions  ADLs/Self Care Home Management;Aquatic Therapy;Electrical Stimulation;Cryotherapy;Moist Heat;Iontophoresis 12m/ml Dexamethasone;Functional mobility training;Therapeutic activities;Therapeutic exercise;Traction;Ultrasound;Neuromuscular re-education;Manual techniques;Patient/family education;Manual lymph drainage;Passive range of motion;Dry needling;Energy conservation;Taping    PT Next Visit Plan  Ease soft tissue restrictions, increase ROM as able    PT Home Exercise Plan  pulleys shoulder abd/flex, sidelying lat stretch, supine ER stretch, scar massage    Consulted and Agree with Plan of Care  Patient       Patient will benefit from skilled therapeutic intervention in order to improve the following deficits and impairments:  Decreased activity tolerance, Decreased endurance, Decreased range of motion, Decreased skin integrity, Hypomobility, Increased fascial restricitons, Impaired UE functional use, Improper body mechanics, Pain, Postural dysfunction, Impaired flexibility, Decreased scar mobility  Visit Diagnosis: Acute pain of left shoulder  Acute pain of right shoulder     Problem List Patient Active Problem List   Diagnosis Date Noted  . Iron deficiency anemia due to chronic blood loss 02/14/2019  . Melena   . Epistaxis 01/08/2019  . Acquired absence of right breast 12/17/2018  . Admission for breast reconstruction following mastectomy 12/09/2018  . Use of tamoxifen (Nolvadex) 12/04/2018  . Acquired absence of left breast 09/03/2018  . Visual changes 08/20/2018  . Breast  asymmetry following reconstructive surgery 08/13/2018  . Hot flashes due to menopause 06/18/2018  . Status  post left mastectomy 06/04/2018  . S/P breast reconstruction 05/28/2018  . Vasomotor symptoms due to menopause 04/17/2018  . Elevated LFTs 03/17/2018  . Goals of care, counseling/discussion 03/17/2018  . Hypomagnesemia 03/04/2018  . Diarrhea 01/02/2018  . Encounter for antineoplastic chemotherapy 12/21/2017  . Encounter for antineoplastic immunotherapy 12/21/2017  . 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 Breast mass, right 12/05/2016  . Monoallelic mutation of CHEK2 gene in female patient 06/20/2015  . Malignant neoplasm of upper-outer quadrant of female breast ([place]) 09/17/2014   Shelton Silvas PT, DPT Shelton Silvas 03/13/2019, 5:39 PM  [facility] [address], [place], [address] Phone: [phone]   Fax:  [phone]  Name: Denise [name] MRN: [id number] Date of Birth: 06-17-1976

## 2019-03-14 ENCOUNTER — Encounter: Payer: Self-pay | Admitting: Plastic Surgery

## 2019-03-14 ENCOUNTER — Ambulatory Visit (INDEPENDENT_AMBULATORY_CARE_PROVIDER_SITE_OTHER): Payer: BC Managed Care – PPO | Admitting: Plastic Surgery

## 2019-03-14 VITALS — BP 116/77 | HR 83 | Temp 97.7°F | Ht 67.0 in | Wt 186.6 lb

## 2019-03-14 DIAGNOSIS — Z9882 Breast implant status: Secondary | ICD-10-CM

## 2019-03-14 DIAGNOSIS — Z9012 Acquired absence of left breast and nipple: Secondary | ICD-10-CM

## 2019-03-14 DIAGNOSIS — Z9889 Other specified postprocedural states: Secondary | ICD-10-CM

## 2019-03-14 DIAGNOSIS — C50419 Malignant neoplasm of upper-outer quadrant of unspecified female breast: Secondary | ICD-10-CM

## 2019-03-14 MED ORDER — DIAZEPAM 2 MG PO TABS: 2.0000 mg | ORAL_TABLET | Freq: Two times a day (BID) | ORAL | 0 refills | Status: AC | PRN

## 2019-03-14 MED ORDER — HYDROCODONE-ACETAMINOPHEN 5-325 MG PO TABS: 1.0000 | ORAL_TABLET | Freq: Four times a day (QID) | ORAL | 0 refills | Status: AC | PRN

## 2019-03-14 MED ORDER — ONDANSETRON HCL 4 MG PO TABS: 4.0000 mg | ORAL_TABLET | Freq: Three times a day (TID) | ORAL | 0 refills | Status: AC | PRN

## 2019-03-14 MED ORDER — CEPHALEXIN 500 MG PO CAPS: 500.0000 mg | ORAL_CAPSULE | Freq: Four times a day (QID) | ORAL | 0 refills | Status: AC

## 2019-03-14 NOTE — Progress Notes (Signed)
Patient ID: Denise [name], female    DOB: January 04, 1976, 43 y.o.   MRN: [id number]   Chief Complaint  Patient presents with  . Pre-op Exam    Removal of (B) breast expanders and placement of silcone implants    The patient is a 43 yrs old wf here for a preoperative history and physical for secondary surgery.  She has bilateral expanders in place and a little bit of asymmetry.  We compensated with altering the volume between the 2 breasts.  The final volume is listed below.  We will need to lower the left expander.  The right expander needs to go slightly medially positioned.  We are also planning on taking out the Port-A-Cath.  All incisions look good.  No sign of infection.  No sign of seroma.  She is pleased with her result so far.   Review of Systems  Constitutional: Negative.   HENT: Negative.   Eyes: Negative.   Respiratory: Negative.   Cardiovascular: Negative.   Gastrointestinal: Negative.   Endocrine: Negative.   Genitourinary: Negative.   Musculoskeletal: Negative.   Skin: Negative.  Negative for color change and rash.  Hematological: Negative.   Psychiatric/Behavioral: Negative.     Past Medical History:  Diagnosis Date  . Abnormal breast biopsy    PASH, [name]  . Breast cancer ([place]) 11/2017   left breast; ER/PR/Her2neu POS  . Family history of breast cancer   . Family history of ovarian cancer   . Fibroadenoma of breast, left 2000, 2016  . Genetic screening 08/26/2013   BRCA/BART negative/[facility], CHEK2 POS 2017  . Increased risk of breast cancer 2017   IBIS=47%  . Left breast lump 06/18/2017   Fluid/drained [name]  . Migraine   . Monoallelic mutation of CHEK2 gene in female patient 2017   increased risk of breast and colon cancer  . Personal history of chemotherapy   . Personal history of radiation therapy     Past Surgical History:  Procedure Laterality Date  . BREAST BIOPSY Bilateral 09/08/14   neg  . BREAST BIOPSY Left 03/16/2015   Procedure: BREAST BIOPSY WITH NEEDLE LOCALIZATION;  Surgeon: [name], MD;  Location: [facility];  Service: General;  Laterality: Left;  . BREAST BIOPSY Left 11/2017   positive  . BREAST CYST ASPIRATION Left 06/18/2017   cyst aspiration  . BREAST EXCISIONAL BIOPSY Left 09/2014  . BREAST EXCISIONAL BIOPSY Right    age 55's  . BREAST RECONSTRUCTION WITH PLACEMENT OF TISSUE EXPANDER AND FLEX HD (ACELLULAR HYDRATED DERMIS) Left 05/20/2018   Procedure: BREAST RECONSTRUCTION WITH PLACEMENT OF TISSUE EXPANDER AND FLEX HD (ACELLULAR HYDRATED DERMIS);  Surgeon: [name], DO;  Location: [facility];  Service: Plastics;  Laterality: Left;  . BREAST RECONSTRUCTION WITH PLACEMENT OF TISSUE EXPANDER AND FLEX HD (ACELLULAR HYDRATED DERMIS) Right 12/09/2018   Procedure: BREAST RECONSTRUCTION WITH PLACEMENT OF TISSUE EXPANDER AND FLEX HD (ACELLULAR HYDRATED DERMIS);  Surgeon: [name], DO;  Location: [facility];  Service: Plastics;  Laterality: Right;  . BREAST SURGERY Right March 2000   benign fibroadenoma  . BREAST SURGERY Left 08/08/13   excision  . BREAST SURGERY Left 09/23/14   excision  . ESOPHAGOGASTRODUODENOSCOPY (EGD) WITH PROPOFOL N/A 01/20/2019   Procedure: ESOPHAGOGASTRODUODENOSCOPY (EGD) WITH PROPOFOL;  Surgeon: [name], MD;  Location: [place];  Service: Gastroenterology;  Laterality: N/A;  . MASTECTOMY Left 05/20/2018  . MASTECTOMY W/ SENTINEL NODE BIOPSY Left 05/20/2018   Procedure:  MASTECTOMY WITH SENTINEL LYMPH NODE BIOPSY;  Surgeon: [name], MD;  Location: [facility];  Service: General;  Laterality: Left;  . PORTACATH PLACEMENT Right 12/17/2017   Procedure: INSERTION PORT-A-CATH;  Surgeon: [name], MD;  Location: [facility];  Service: General;  Laterality: Right;  . SIMPLE MASTECTOMY WITH AXILLARY SENTINEL NODE BIOPSY Right 12/09/2018   Procedure: SIMPLE MASTECTOMY RIGHT;  Surgeon: [name], MD;  Location: [facility];  Service:  General;  Laterality: Right;      Current Outpatient Medications:  .  acetaminophen (TYLENOL) 500 MG tablet, Take 500 mg by mouth every 6 (six) hours as needed for moderate pain or headache., Disp: , Rfl:  .  diazepam (VALIUM) 2 MG tablet, Take 1 tablet (2 mg total) by mouth every 6 (six) hours as needed for anxiety., Disp: 30 tablet, Rfl: 0 .  glucose blood (PRECISION QID TEST) test strip, Use 1 each (1 strip total) once daily Use as instructed., Disp: , Rfl:  .  HYDROcodone-acetaminophen (NORCO) 5-325 MG tablet, Take 1 tablet by mouth every 8 (eight) hours as needed for moderate pain., Disp: 15 tablet, Rfl: 0 .  Lancets Misc. (UNISTIK 2 NORMAL) MISC, Use 1 each once daily Use as instructed., Disp: , Rfl:  .  lidocaine-prilocaine (EMLA) cream, Apply 1 application topically as needed (port access)., Disp: , Rfl:  .  magnesium oxide (MAG-OX) 400 MG tablet, Take 1 tablet (400 mg total) by mouth daily., Disp: 30 tablet, Rfl: 0 .  metoprolol succinate (TOPROL-XL) 25 MG 24 hr tablet, Take 1 tablet (25 mg total) by mouth daily., Disp: 30 tablet, Rfl: 0 .  omeprazole (PRILOSEC OTC) 20 MG tablet, Take 20 mg by mouth daily as needed (for acid reflux)., Disp: , Rfl:  .  tamoxifen (NOLVADEX) 20 MG tablet, Take 1 tablet (20 mg total) by mouth daily., Disp: 30 tablet, Rfl: 3 No current facility-administered medications for this visit.   Facility-Administered Medications Ordered in Other Visits:  .  sodium chloride flush (NS) 0.9 % injection 10 mL, 10 mL, Intravenous, PRN, Mike Gip, Melissa C, MD, 10 mL at 04/17/18 0844   Objective:   Vitals:   03/14/19 0846  BP: 116/77  Pulse: 83  Temp: 97.7 F (36.5 C)  SpO2: 99%    Physical Exam Vitals signs and nursing note reviewed.  Constitutional:      Appearance: Normal appearance.  Neck:     Musculoskeletal: Normal range of motion.  Cardiovascular:     Rate and Rhythm: Normal rate.  Pulmonary:     Effort: Pulmonary effort is normal.  Abdominal:      General: Abdomen is flat. There is no distension.  Musculoskeletal: Normal range of motion.  Skin:    Capillary Refill: Capillary refill takes less than 2 seconds.  Neurological:     General: No focal deficit present.     Mental Status: She is alert and oriented to person, place, and time.  Psychiatric:        Mood and Affect: Mood normal.        Behavior: Behavior normal.        Thought Content: Thought content normal.     Assessment & Plan:  Malignant neoplasm of upper-outer quadrant of female breast, unspecified estrogen receptor status, unspecified laterality (HCC)  S/P breast reconstruction  Status post left mastectomy Prescriptions sent to pharmacy  Plan for removal of bilateral expanders and placement of silicone implants. Also removal of the port-a-cath. The risks that can be encountered  with and after placement of a breast impants were discussed and include the following but not limited to these: bleeding, infection, delayed healing, anesthesia risks, skin sensation changes, injury to structures including nerves, blood vessels, and muscles which may be temporary or permanent, allergies to tape, suture materials and glues, blood products, topical preparations or injected agents, skin contour irregularities, skin discoloration and swelling, deep vein thrombosis, cardiac and pulmonary complications, pain, which may persist, fluid accumulation, wrinkling of the skin over the expander, changes in nipple or breast sensation, expander leakage or rupture, faulty position of the expander, persistent pain, formation of tight scar tissue around the expander (capsular contracture), possible need for revisional surgery or staged procedures.  Right:450/ 375cc Left:510/ [place], DO

## 2019-03-14 NOTE — Progress Notes (Signed)
Radiation Oncology Follow up Note  Name: Denise [name]   Date:   09/11/2018 MRN:  [id number] DOB: 1976/03/09    This 43 y.o. female presents to the clinic today for 1 month follow-up.  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  Seen today in routine follow-up she is doing well she specifically denies any significant skin changes cough or bone pain..  She is having no swelling in her upper extremities.  COMPLICATIONS OF TREATMENT: none  FOLLOW UP COMPLIANCE: keeps appointments   PHYSICAL EXAM:  BP 126/83   Pulse 90   Temp (!) 96.7 F (35.9 C)   Resp 18   Wt 182 lb 5.1 oz (82.7 kg)   BMI 28.56 kg/m  Patient is status post left modified radical mastectomy skin is fine no evidence of mass or nodularity is noted.  Lungs are clear to AMP.  No axillary supraclavicular adenopathy is appreciated.  No lymphedema is identified.  Well-developed well-nourished patient in NAD. HEENT reveals PERLA, EOMI, discs not visualized.  Oral cavity is clear. No oral mucosal lesions are identified. Neck is clear without evidence of cervical or supraclavicular adenopathy. Lungs are clear to A&P. Cardiac examination is essentially unremarkable with regular rate and rhythm without murmur rub or thrill. Abdomen is benign with no organomegaly or masses noted. Motor sensory and DTR levels are equal and symmetric in the upper and lower extremities. Cranial nerves II through XII are grossly intact. Proprioception is intact. No peripheral adenopathy or  edema is identified. No motor or sensory levels are noted. Crude visual fields are within normal range.  RADIOLOGY RESULTS: No current films for review  PLAN: Present time patient is 1 month out from radiation therapy.  She is doing well recovering nicely.  She continues on Kadcyla cycle #4 today.  Which she is tolerating well.  I have asked to see her back in 4 to 5 months for follow-up.  She continues close follow-up care with medical oncology.  I would like to take this opportunity to thank you for allowing me to participate in the care of your patient.Noreene Filbert, MD

## 2019-03-14 NOTE — Addendum Note (Signed)
Encounter addended by: Noreene Filbert, MD on: 03/14/2019 11:21 AM  Actions taken: Clinical Note Signed, Level of Service modified

## 2019-03-14 NOTE — H&P (View-Only) (Signed)
Patient ID: Denise [name], female    DOB: August 20, 1975, 43 y.o.   MRN: [id number]   Chief Complaint  Patient presents with  . Pre-op Exam    Removal of (B) breast expanders and placement of silcone implants    The patient is a 43 yrs old wf here for a preoperative history and physical for secondary surgery.  She has bilateral expanders in place and a little bit of asymmetry.  We compensated with altering the volume between the 2 breasts.  The final volume is listed below.  We will need to lower the left expander.  The right expander needs to go slightly medially positioned.  We are also planning on taking out the Port-A-Cath.  All incisions look good.  No sign of infection.  No sign of seroma.  She is pleased with her result so far.   Review of Systems  Constitutional: Negative.   HENT: Negative.   Eyes: Negative.   Respiratory: Negative.   Cardiovascular: Negative.   Gastrointestinal: Negative.   Endocrine: Negative.   Genitourinary: Negative.   Musculoskeletal: Negative.   Skin: Negative.  Negative for color change and rash.  Hematological: Negative.   Psychiatric/Behavioral: Negative.     Past Medical History:  Diagnosis Date  . Abnormal breast biopsy    PASH, [name]  . Breast cancer ([place]) 11/2017   left breast; ER/PR/Her2neu POS  . Family history of breast cancer   . Family history of ovarian cancer   . Fibroadenoma of breast, left 2000, 2016  . Genetic screening 08/26/2013   BRCA/BART negative/[facility], CHEK2 POS 2017  . Increased risk of breast cancer 2017   IBIS=47%  . Left breast lump 06/18/2017   Fluid/drained [name]  . Migraine   . Monoallelic mutation of CHEK2 gene in female patient 2017   increased risk of breast and colon cancer  . Personal history of chemotherapy   . Personal history of radiation therapy     Past Surgical History:  Procedure Laterality Date  . BREAST BIOPSY Bilateral 09/08/14   neg  . BREAST BIOPSY Left 03/16/2015   Procedure: BREAST BIOPSY WITH NEEDLE LOCALIZATION;  Surgeon: [name], MD;  Location: [facility];  Service: General;  Laterality: Left;  . BREAST BIOPSY Left 11/2017   positive  . BREAST CYST ASPIRATION Left 06/18/2017   cyst aspiration  . BREAST EXCISIONAL BIOPSY Left 09/2014  . BREAST EXCISIONAL BIOPSY Right    age 47's  . BREAST RECONSTRUCTION WITH PLACEMENT OF TISSUE EXPANDER AND FLEX HD (ACELLULAR HYDRATED DERMIS) Left 05/20/2018   Procedure: BREAST RECONSTRUCTION WITH PLACEMENT OF TISSUE EXPANDER AND FLEX HD (ACELLULAR HYDRATED DERMIS);  Surgeon: [name], DO;  Location: [facility];  Service: Plastics;  Laterality: Left;  . BREAST RECONSTRUCTION WITH PLACEMENT OF TISSUE EXPANDER AND FLEX HD (ACELLULAR HYDRATED DERMIS) Right 12/09/2018   Procedure: BREAST RECONSTRUCTION WITH PLACEMENT OF TISSUE EXPANDER AND FLEX HD (ACELLULAR HYDRATED DERMIS);  Surgeon: [name], DO;  Location: [facility];  Service: Plastics;  Laterality: Right;  . BREAST SURGERY Right March 2000   benign fibroadenoma  . BREAST SURGERY Left 08/08/13   excision  . BREAST SURGERY Left 09/23/14   excision  . ESOPHAGOGASTRODUODENOSCOPY (EGD) WITH PROPOFOL N/A 01/20/2019   Procedure: ESOPHAGOGASTRODUODENOSCOPY (EGD) WITH PROPOFOL;  Surgeon: [name], MD;  Location: [place];  Service: Gastroenterology;  Laterality: N/A;  . MASTECTOMY Left 05/20/2018  . MASTECTOMY W/ SENTINEL NODE BIOPSY Left 05/20/2018   Procedure:  MASTECTOMY WITH SENTINEL LYMPH NODE BIOPSY;  Surgeon: [name], MD;  Location: [facility];  Service: General;  Laterality: Left;  . PORTACATH PLACEMENT Right 12/17/2017   Procedure: INSERTION PORT-A-CATH;  Surgeon: [name], MD;  Location: [facility];  Service: General;  Laterality: Right;  . SIMPLE MASTECTOMY WITH AXILLARY SENTINEL NODE BIOPSY Right 12/09/2018   Procedure: SIMPLE MASTECTOMY RIGHT;  Surgeon: [name], MD;  Location: [facility];  Service:  General;  Laterality: Right;      Current Outpatient Medications:  .  acetaminophen (TYLENOL) 500 MG tablet, Take 500 mg by mouth every 6 (six) hours as needed for moderate pain or headache., Disp: , Rfl:  .  diazepam (VALIUM) 2 MG tablet, Take 1 tablet (2 mg total) by mouth every 6 (six) hours as needed for anxiety., Disp: 30 tablet, Rfl: 0 .  glucose blood (PRECISION QID TEST) test strip, Use 1 each (1 strip total) once daily Use as instructed., Disp: , Rfl:  .  HYDROcodone-acetaminophen (NORCO) 5-325 MG tablet, Take 1 tablet by mouth every 8 (eight) hours as needed for moderate pain., Disp: 15 tablet, Rfl: 0 .  Lancets Misc. (UNISTIK 2 NORMAL) MISC, Use 1 each once daily Use as instructed., Disp: , Rfl:  .  lidocaine-prilocaine (EMLA) cream, Apply 1 application topically as needed (port access)., Disp: , Rfl:  .  magnesium oxide (MAG-OX) 400 MG tablet, Take 1 tablet (400 mg total) by mouth daily., Disp: 30 tablet, Rfl: 0 .  metoprolol succinate (TOPROL-XL) 25 MG 24 hr tablet, Take 1 tablet (25 mg total) by mouth daily., Disp: 30 tablet, Rfl: 0 .  omeprazole (PRILOSEC OTC) 20 MG tablet, Take 20 mg by mouth daily as needed (for acid reflux)., Disp: , Rfl:  .  tamoxifen (NOLVADEX) 20 MG tablet, Take 1 tablet (20 mg total) by mouth daily., Disp: 30 tablet, Rfl: 3 No current facility-administered medications for this visit.   Facility-Administered Medications Ordered in Other Visits:  .  sodium chloride flush (NS) 0.9 % injection 10 mL, 10 mL, Intravenous, PRN, Mike Gip, Melissa C, MD, 10 mL at 04/17/18 0844   Objective:   Vitals:   03/14/19 0846  BP: 116/77  Pulse: 83  Temp: 97.7 F (36.5 C)  SpO2: 99%    Physical Exam Vitals signs and nursing note reviewed.  Constitutional:      Appearance: Normal appearance.  Neck:     Musculoskeletal: Normal range of motion.  Cardiovascular:     Rate and Rhythm: Normal rate.  Pulmonary:     Effort: Pulmonary effort is normal.  Abdominal:      General: Abdomen is flat. There is no distension.  Musculoskeletal: Normal range of motion.  Skin:    Capillary Refill: Capillary refill takes less than 2 seconds.  Neurological:     General: No focal deficit present.     Mental Status: She is alert and oriented to person, place, and time.  Psychiatric:        Mood and Affect: Mood normal.        Behavior: Behavior normal.        Thought Content: Thought content normal.     Assessment & Plan:  Malignant neoplasm of upper-outer quadrant of female breast, unspecified estrogen receptor status, unspecified laterality (HCC)  S/P breast reconstruction  Status post left mastectomy Prescriptions sent to pharmacy  Plan for removal of bilateral expanders and placement of silicone implants. Also removal of the port-a-cath. The risks that can be encountered  with and after placement of a breast impants were discussed and include the following but not limited to these: bleeding, infection, delayed healing, anesthesia risks, skin sensation changes, injury to structures including nerves, blood vessels, and muscles which may be temporary or permanent, allergies to tape, suture materials and glues, blood products, topical preparations or injected agents, skin contour irregularities, skin discoloration and swelling, deep vein thrombosis, cardiac and pulmonary complications, pain, which may persist, fluid accumulation, wrinkling of the skin over the expander, changes in nipple or breast sensation, expander leakage or rupture, faulty position of the expander, persistent pain, formation of tight scar tissue around the expander (capsular contracture), possible need for revisional surgery or staged procedures.  Right:450/ 375cc Left:510/ [place], DO

## 2019-03-17 ENCOUNTER — Encounter: Payer: Self-pay | Admitting: Plastic Surgery

## 2019-03-18 ENCOUNTER — Encounter: Payer: BC Managed Care – PPO | Admitting: Physical Therapy

## 2019-03-19 ENCOUNTER — Encounter: Payer: Self-pay | Admitting: Plastic Surgery

## 2019-03-20 ENCOUNTER — Other Ambulatory Visit: Payer: Self-pay

## 2019-03-20 ENCOUNTER — Ambulatory Visit: Payer: BC Managed Care – PPO | Attending: Plastic Surgery

## 2019-03-20 DIAGNOSIS — M25511 Pain in right shoulder: Secondary | ICD-10-CM | POA: Insufficient documentation

## 2019-03-20 DIAGNOSIS — M25512 Pain in left shoulder: Secondary | ICD-10-CM | POA: Diagnosis present

## 2019-03-20 NOTE — Therapy (Signed)
[facility] [address] [place], [address] Phone: [phone]   Fax:  [phone]  Physical Therapy Treatment  Patient Details  Name: RANEY ANTWINE MRN: [id number] Date of Birth: 1976-03-24 Referring Provider (PT): Dillingham   Encounter Date: 03/20/2019  PT End of Session - 03/20/19 1627    Visit Number  23    Number of Visits  49    Date for PT Re-Evaluation  04/18/19    PT Start Time  1620    PT Stop Time  1700    PT Time Calculation (min)  40 min    Activity Tolerance  Patient tolerated treatment well;No increased pain    Behavior During Therapy  WFL for tasks assessed/performed       Past Medical History:  Diagnosis Date  . Abnormal breast biopsy    [facility], [name]  . Breast cancer ([place]) 11/2017   left breast; ER/PR/Her2neu POS  . Family history of breast cancer   . Family history of ovarian cancer   . Fibroadenoma of breast, left 2000, 2016  . Genetic screening 08/26/2013   BRCA/BART negative/[facility], CHEK2 POS 2017  . Increased risk of breast cancer 2017   IBIS=47%  . Left breast lump 06/18/2017   Fluid/drained [name]  . Migraine   . Monoallelic mutation of CHEK2 gene in female patient 2017   increased risk of breast and colon cancer  . Personal history of chemotherapy   . Personal history of radiation therapy     Past Surgical History:  Procedure Laterality Date  . BREAST BIOPSY Bilateral 09/08/14   neg  . BREAST BIOPSY Left 03/16/2015   Procedure: BREAST BIOPSY WITH NEEDLE LOCALIZATION;  Surgeon: [name], MD;  Location: [facility];  Service: General;  Laterality: Left;  . BREAST BIOPSY Left 11/2017   positive  . BREAST CYST ASPIRATION Left 06/18/2017   cyst aspiration  . BREAST EXCISIONAL BIOPSY Left 09/2014  . BREAST EXCISIONAL BIOPSY Right    age 68's  . BREAST RECONSTRUCTION WITH PLACEMENT OF TISSUE EXPANDER AND FLEX HD (ACELLULAR HYDRATED DERMIS) Left  05/20/2018   Procedure: BREAST RECONSTRUCTION WITH PLACEMENT OF TISSUE EXPANDER AND FLEX HD (ACELLULAR HYDRATED DERMIS);  Surgeon: [name], DO;  Location: [facility];  Service: Plastics;  Laterality: Left;  . BREAST RECONSTRUCTION WITH PLACEMENT OF TISSUE EXPANDER AND FLEX HD (ACELLULAR HYDRATED DERMIS) Right 12/09/2018   Procedure: BREAST RECONSTRUCTION WITH PLACEMENT OF TISSUE EXPANDER AND FLEX HD (ACELLULAR HYDRATED DERMIS);  Surgeon: [name], DO;  Location: [facility];  Service: Plastics;  Laterality: Right;  . BREAST SURGERY Right March 2000   benign fibroadenoma  . BREAST SURGERY Left 08/08/13   excision  . BREAST SURGERY Left 09/23/14   excision  . ESOPHAGOGASTRODUODENOSCOPY (EGD) WITH PROPOFOL N/A 01/20/2019   Procedure: ESOPHAGOGASTRODUODENOSCOPY (EGD) WITH PROPOFOL;  Surgeon: [name], MD;  Location: [facility];  Service: Gastroenterology;  Laterality: N/A;  . MASTECTOMY Left 05/20/2018  . MASTECTOMY W/ SENTINEL NODE BIOPSY Left 05/20/2018   Procedure: MASTECTOMY WITH SENTINEL LYMPH NODE BIOPSY;  Surgeon: [name], MD;  Location: [facility];  Service: General;  Laterality: Left;  . PORTACATH PLACEMENT Right 12/17/2017   Procedure: INSERTION PORT-A-CATH;  Surgeon: [name], MD;  Location: [facility];  Service: General;  Laterality: Right;  . SIMPLE MASTECTOMY WITH AXILLARY SENTINEL NODE BIOPSY Right 12/09/2018   Procedure: SIMPLE MASTECTOMY RIGHT;  Surgeon: [name], MD;  Location: [facility];  Service: General;  Laterality: Right;    There were no vitals filed for this visit.  Subjective Assessment - 03/20/19 1625    Subjective  Pt doing generally well, no pain, but feels elastic tightness from pec major that limits overhead movement.    Pertinent History  Patient is a 43 year old female with breast cancer history beginning in 2016 with multiple benign lumpectomies. Patient reports first malignant finding this past June 2019 in L  breast, with following masectomy with saline expander placed 05/20/18. Patient reports chemo prior with radiation beginning next week 5days/week, for 6 weeks. Patient reports she is also planning to have R masectomy with saline expansion in the future, unscheduled. 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 Patient reports she is a mother of a 52 year old and 27 year old that she reports are very involved in extra-curriculars. Patient reports her ROM in the shoulder is improving, but that  she is having some pain in the inside elbow and into medial forearm with straightening. Patient reports some TTP at incision site, but most all pain at elbow into forearm. Worst pain in past week 7/10 and best 0/10.  Reports pain is sharp in the medial elbow and forearm; denies numbness or tingling    Currently in Pain?  No/denies        INTERVENTION THIS DATE:  -MFR to Pec Major, ART with horizontal ABDCT and then flexion; longaxis distraction at 135 degrees scaption -Prayer Stretch in chair 2x30sec, then 1x30sec with ball in hands to facilitate external rotation -Prone 90/90 pecx stretch with trunk rotation (unsuccessful) -Supine on longitudial half roll, T stretch 3x60sec -Supine on longitudial half roll wand flexion in wide grip 10x10sec (1lb AW on left side) -Goalpost hip hinge pec stretch 2x60sec  -Overhead Chair Squat, cued hip hinge, wide grip for isometric scapular despression and retraction 1x10            PT Short Term Goals - 02/20/19 1624      PT SHORT TERM GOAL #1   Title  Pt will be independent with HEP in order to improve strength and motion, and to decrease pain in order to improve pain-free function at home and work.    Baseline  Completes HEP without questions or concerns    Time  4    Period  Weeks    Status  Achieved        PT Long Term Goals - 02/20/19 1625      PT LONG TERM GOAL #1   Title  Pt will decrease worst pain as reported on NPRS by at  least 3 points in order to demonstrate clinically significant reduction in pain.    Baseline  02/20/19 2/10    Time  8    Period  Weeks    Status  Achieved      PT LONG TERM GOAL #2   Title  Patient will increase FOTO score to 72 to demonstrate predicted increase in functional mobility to complete ADLs    Baseline  02/20/19    Time  8    Period  Weeks    Status  Achieved      PT LONG TERM GOAL #3   Title  Patient will demonstrate all active, painfree shoulder ROM within normal limits in order to complete ADLs    Baseline  02/20/19 R IR to T12 and full but "tense" with R shoulder abd  Time  8    Period  Weeks    Status  On-going      PT LONG TERM GOAL #4   Title  Pt will increase strength by at least 1/2 MMT grade in order to demonstrate improvement in strength and function    Baseline  02/20/19: R/L Y bilat T 4 bilat  Lats 4- bilat    Time  8    Period  Weeks    Status  New            Plan - 03/20/19 1640    Clinical Impression Statement  Continued to progress program. This is last visit prior to surgery in 2 weeks. Updated home pec stretching regimen to include more long-duration stretches as well as options for specifying external rotation limitations versus flexion limitations as these are often coming from different pec fibers. Pt able to complete all without shoulder pain, simply stretchiness in Left pec. Overhead head is much improved at end of session.    Rehab Potential  Good    Clinical Impairments Affecting Rehab Potential  (+) age, motivation, social support, (-) current sedentary lifestyle, ongoing cancer treatment, other comorbidities    PT Frequency  2x / week    PT Duration  8 weeks    PT Treatment/Interventions  ADLs/Self Care Home Management;Aquatic Therapy;Electrical Stimulation;Cryotherapy;Moist Heat;Iontophoresis 71m/ml Dexamethasone;Functional mobility training;Therapeutic activities;Therapeutic exercise;Traction;Ultrasound;Neuromuscular re-education;Manual  techniques;Patient/family education;Manual lymph drainage;Passive range of motion;Dry needling;Energy conservation;Taping    PT Next Visit Plan  Ease soft tissue restrictions, increase ROM as able    PT Home Exercise Plan  pulleys shoulder abd/flex, sidelying lat stretch, supine ER stretch, scar massage    Consulted and Agree with Plan of Care  Patient       Patient will benefit from skilled therapeutic intervention in order to improve the following deficits and impairments:  Decreased activity tolerance, Decreased endurance, Decreased range of motion, Decreased skin integrity, Hypomobility, Increased fascial restricitons, Impaired UE functional use, Improper body mechanics, Pain, Postural dysfunction, Impaired flexibility, Decreased scar mobility  Visit Diagnosis: Acute pain of left shoulder  Acute pain of right shoulder     Problem List Patient Active Problem List   Diagnosis Date Noted  . Iron deficiency anemia due to chronic blood loss 02/14/2019  . Melena   . Epistaxis 01/08/2019  . Acquired absence of right breast 12/17/2018  . Admission for breast reconstruction following mastectomy 12/09/2018  . Use of tamoxifen (Nolvadex) 12/04/2018  . Acquired absence of left breast 09/03/2018  . Visual changes 08/20/2018  . Breast asymmetry following reconstructive surgery 08/13/2018  . Hot flashes due to menopause 06/18/2018  . Status post left mastectomy 06/04/2018  . S/P breast reconstruction 05/28/2018  . Vasomotor symptoms due to menopause 04/17/2018  . Elevated LFTs 03/17/2018  . Goals of care, counseling/discussion 03/17/2018  . Hypomagnesemia 03/04/2018  . Diarrhea 01/02/2018  . Encounter for antineoplastic chemotherapy 12/21/2017  . Encounter for antineoplastic immunotherapy 12/21/2017  . 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 Breast mass, right 12/05/2016  . Monoallelic mutation of CHEK2 gene in female patient 06/20/2015  . Malignant neoplasm of  upper-outer quadrant of female breast ([facility] 09/17/2014   5:07 PM, 03/20/19 AEtta Grandchild PT, DPT Physical Therapist - [phone](Office)    , C 03/20/2019, 4:47 PM  [facility] [address] [place] [address]Phone: [phone]  Fax:  [phone] Name: ELINDYN VOSSLER  MRN: [id number] Date of Birth: 1976-06-19

## 2019-03-24 ENCOUNTER — Other Ambulatory Visit: Payer: Self-pay

## 2019-03-24 ENCOUNTER — Encounter
Admission: RE | Admit: 2019-03-24 | Discharge: 2019-03-24 | Disposition: A | Discharge status: Home / Self Care | Payer: BC Managed Care – PPO | Source: Ambulatory Visit | Attending: Plastic Surgery | Admitting: Plastic Surgery

## 2019-03-24 DIAGNOSIS — I10 Essential (primary) hypertension: Secondary | ICD-10-CM | POA: Insufficient documentation

## 2019-03-24 DIAGNOSIS — Z01812 Encounter for preprocedural laboratory examination: Secondary | ICD-10-CM | POA: Diagnosis present

## 2019-03-24 NOTE — Patient Instructions (Signed)
Your procedure is scheduled on: 03-31-19 MONDAY Report to Same Day Surgery 2nd floor medical mall [facility] Entrance-take elevator on left to 2nd floor.  Check in with surgery information desk.) To find out your arrival time please call [phone] between 1PM - 3PM on 03-28-19 FRIDAY  Remember: Instructions that are not followed completely may result in serious medical risk, up to and including death, or upon the discretion of your surgeon and anesthesiologist your surgery may need to be rescheduled.    _x___ 1. Do not eat food after midnight the night before your procedure. NO GUM OR CANDY AFTER MIDNIGHT. You may drink clear liquids up to 2 hours before you are scheduled to arrive at the hospital for your procedure.  Do not drink clear liquids within 2 hours of your scheduled arrival to the hospital.  Clear liquids include  --Water or Apple juice without pulp  --Gatorade  --Black Coffee or Clear Tea (No milk, no creamers, do not add anything to the coffee or Tea)   ____Ensure clear carbohydrate drink on the way to the hospital for bariatric patients  ____Ensure clear carbohydrate drink 3 hours before surgery.    __x__ 2. No Alcohol for 24 hours before or after surgery.   __x__3. No Smoking or e-cigarettes for 24 prior to surgery.  Do not use any chewable tobacco products for at least 6 hour prior to surgery   ____  4. Bring all medications with you on the day of surgery if instructed.    __x__ 5. Notify your doctor if there is any change in your medical condition     (cold, fever, infections).    x___6. On the morning of surgery brush your teeth with toothpaste and water.  You may rinse your mouth with mouth wash if you wish.  Do not swallow any toothpaste or mouthwash.   Do not wear jewelry, make-up, hairpins, clips or nail polish.  Do not wear lotions, powders, or perfumes. You may wear deodorant.  Do not shave 48 hours prior to surgery. Men may shave face and neck.  Do  not bring valuables to the hospital.    [facility] is not responsible for any belongings or valuables.               Contacts, dentures or bridgework may not be worn into surgery.  Leave your suitcase in the car. After surgery it may be brought to your room.  For patients admitted to the hospital, discharge time is determined by your treatment team.  _  Patients discharged the day of surgery will not be allowed to drive home.  You will need someone to drive you home and stay with you the night of your procedure.    Please read over the following fact sheets that you were given:   [facility] Preparing for Surgery  _x___ TAKE THE FOLLOWING MEDICATION THE MORNING OF SURGERY WITH A SMALL SIP OF WATER. These include:  1. METOPROLOL (TOPROL)  2. TAMOXIFEN (NOLVADEX)  3. PRILOSEC (OMEPRAZOLE)  4. TAKE A PRILOSEC THE NIGHT BEFORE YOUR SURGERY  5.  6.  ____Fleets enema or Magnesium Citrate as directed.   _x___ Use CHG Soap or sage wipes as directed on instruction sheet   ____ Use inhalers on the day of surgery and bring to hospital day of surgery  ____ Stop Metformin and Janumet 2 days prior to surgery.    ____ Take 1/2 of usual insulin dose the night before surgery and none on  the morning surgery.   ____ Follow recommendations from Cardiologist, Pulmonologist or PCP regarding stopping Aspirin, Coumadin, Plavix ,Eliquis, Effient, or Pradaxa, and Pletal.  X____Stop Anti-inflammatories such as Advil, Aleve, Ibuprofen, Motrin, Naproxen, Naprosyn, Goodies powders or aspirin products NOW-OK to take Tylenol    ____ Stop supplements until after surgery.     ____ Bring C-Pap to the hospital.

## 2019-03-27 ENCOUNTER — Inpatient Hospital Stay: Admission: RE | Admit: 2019-03-27 | Payer: BC Managed Care – PPO | Source: Ambulatory Visit

## 2019-03-27 ENCOUNTER — Other Ambulatory Visit: Payer: Self-pay

## 2019-03-27 ENCOUNTER — Encounter
Admission: RE | Admit: 2019-03-27 | Discharge: 2019-03-27 | Disposition: A | Discharge status: Home / Self Care | Payer: BC Managed Care – PPO | Source: Ambulatory Visit | Attending: Plastic Surgery | Admitting: Plastic Surgery

## 2019-03-27 DIAGNOSIS — I498 Other specified cardiac arrhythmias: Secondary | ICD-10-CM | POA: Diagnosis not present

## 2019-03-27 DIAGNOSIS — Z20828 Contact with and (suspected) exposure to other viral communicable diseases: Secondary | ICD-10-CM | POA: Insufficient documentation

## 2019-03-27 DIAGNOSIS — I10 Essential (primary) hypertension: Secondary | ICD-10-CM | POA: Diagnosis not present

## 2019-03-27 DIAGNOSIS — Z01812 Encounter for preprocedural laboratory examination: Secondary | ICD-10-CM | POA: Insufficient documentation

## 2019-03-27 LAB — SARS CORONAVIRUS 2 (TAT 6-24 HRS): SARS Coronavirus 2: NEGATIVE

## 2019-03-28 ENCOUNTER — Ambulatory Visit: Payer: BC Managed Care – PPO | Admitting: Radiation Oncology

## 2019-03-31 ENCOUNTER — Ambulatory Visit
Admission: RE | Admit: 2019-03-31 | Discharge: 2019-03-31 | Disposition: A | Discharge status: Home / Self Care | Payer: BC Managed Care – PPO | Attending: Plastic Surgery | Admitting: Plastic Surgery

## 2019-03-31 ENCOUNTER — Encounter
Admission: RE | Disposition: A | Discharge status: Home / Self Care | Payer: Self-pay | Source: Home / Self Care | Attending: Plastic Surgery

## 2019-03-31 ENCOUNTER — Other Ambulatory Visit: Payer: BC Managed Care – PPO

## 2019-03-31 ENCOUNTER — Ambulatory Visit: Payer: BC Managed Care – PPO | Admitting: Anesthesiology

## 2019-03-31 ENCOUNTER — Other Ambulatory Visit: Payer: Self-pay | Admitting: Hematology and Oncology

## 2019-03-31 ENCOUNTER — Encounter: Payer: Self-pay | Admitting: *Deleted

## 2019-03-31 DIAGNOSIS — Z45812 Encounter for adjustment or removal of left breast implant: Secondary | ICD-10-CM | POA: Insufficient documentation

## 2019-03-31 DIAGNOSIS — G43909 Migraine, unspecified, not intractable, without status migrainosus: Secondary | ICD-10-CM | POA: Insufficient documentation

## 2019-03-31 DIAGNOSIS — Z45811 Encounter for adjustment or removal of right breast implant: Secondary | ICD-10-CM | POA: Diagnosis not present

## 2019-03-31 DIAGNOSIS — Z853 Personal history of malignant neoplasm of breast: Secondary | ICD-10-CM | POA: Diagnosis not present

## 2019-03-31 DIAGNOSIS — Z803 Family history of malignant neoplasm of breast: Secondary | ICD-10-CM | POA: Insufficient documentation

## 2019-03-31 DIAGNOSIS — Z923 Personal history of irradiation: Secondary | ICD-10-CM | POA: Diagnosis not present

## 2019-03-31 DIAGNOSIS — Z79899 Other long term (current) drug therapy: Secondary | ICD-10-CM | POA: Insufficient documentation

## 2019-03-31 DIAGNOSIS — Z9221 Personal history of antineoplastic chemotherapy: Secondary | ICD-10-CM | POA: Insufficient documentation

## 2019-03-31 DIAGNOSIS — Z452 Encounter for adjustment and management of vascular access device: Secondary | ICD-10-CM | POA: Diagnosis not present

## 2019-03-31 DIAGNOSIS — Z8041 Family history of malignant neoplasm of ovary: Secondary | ICD-10-CM | POA: Diagnosis not present

## 2019-03-31 DIAGNOSIS — Z9013 Acquired absence of bilateral breasts and nipples: Secondary | ICD-10-CM | POA: Insufficient documentation

## 2019-03-31 DIAGNOSIS — C50419 Malignant neoplasm of upper-outer quadrant of unspecified female breast: Secondary | ICD-10-CM

## 2019-03-31 HISTORY — PX: REMOVAL OF BILATERAL TISSUE EXPANDERS WITH PLACEMENT OF BILATERAL BREAST IMPLANTS: SHX6431

## 2019-03-31 HISTORY — PX: PORT-A-CATH REMOVAL: SHX5289

## 2019-03-31 LAB — POCT PREGNANCY, URINE: Preg Test, Ur: NEGATIVE

## 2019-03-31 SURGERY — REMOVAL, TISSUE EXPANDER, BREAST, BILATERAL, WITH BILATERAL IMPLANT IMPLANT INSERTION
Anesthesia: General | Site: Chest | Laterality: Right

## 2019-03-31 MED ORDER — LACTATED RINGERS IV SOLN
INTRAVENOUS | Status: DC
  Administered 2019-03-31 (×2): via INTRAVENOUS

## 2019-03-31 MED ORDER — ACETAMINOPHEN 10 MG/ML IV SOLN
INTRAVENOUS | Status: DC | PRN
  Administered 2019-03-31: 1000 mg via INTRAVENOUS

## 2019-03-31 MED ORDER — PROMETHAZINE HCL 25 MG/ML IJ SOLN: 6.2500 mg | INTRAMUSCULAR | Status: DC | PRN

## 2019-03-31 MED ORDER — PHENYLEPHRINE HCL (PRESSORS) 10 MG/ML IV SOLN
INTRAVENOUS | Status: DC | PRN
  Administered 2019-03-31 (×4): 100 ug via INTRAVENOUS

## 2019-03-31 MED ORDER — MIDAZOLAM HCL 2 MG/2ML IJ SOLN
INTRAMUSCULAR | Status: AC
  Filled 2019-03-31: qty 2

## 2019-03-31 MED ORDER — FENTANYL CITRATE (PF) 100 MCG/2ML IJ SOLN
INTRAMUSCULAR | Status: DC | PRN
  Administered 2019-03-31 (×2): 25 ug via INTRAVENOUS
  Administered 2019-03-31: 50 ug via INTRAVENOUS

## 2019-03-31 MED ORDER — LIDOCAINE HCL (CARDIAC) PF 100 MG/5ML IV SOSY
PREFILLED_SYRINGE | INTRAVENOUS | Status: DC | PRN
  Administered 2019-03-31: 100 mg via INTRAVENOUS

## 2019-03-31 MED ORDER — SODIUM CHLORIDE 0.9 % IV SOLN
INTRAVENOUS | Status: DC | PRN
  Administered 2019-03-31: 500 mL

## 2019-03-31 MED ORDER — SODIUM CHLORIDE FLUSH 0.9 % IV SOLN
INTRAVENOUS | Status: AC
  Filled 2019-03-31: qty 10

## 2019-03-31 MED ORDER — FENTANYL CITRATE (PF) 100 MCG/2ML IJ SOLN
INTRAMUSCULAR | Status: AC
  Filled 2019-03-31: qty 2

## 2019-03-31 MED ORDER — LIDOCAINE-EPINEPHRINE 1 %-1:100000 IJ SOLN
INTRAMUSCULAR | Status: DC | PRN
  Administered 2019-03-31: 10 mL
  Administered 2019-03-31: 8 mL

## 2019-03-31 MED ORDER — ONDANSETRON HCL 4 MG/2ML IJ SOLN
INTRAMUSCULAR | Status: AC
  Filled 2019-03-31: qty 2

## 2019-03-31 MED ORDER — PROPOFOL 10 MG/ML IV BOLUS
INTRAVENOUS | Status: AC
  Filled 2019-03-31: qty 20

## 2019-03-31 MED ORDER — DEXAMETHASONE SODIUM PHOSPHATE 10 MG/ML IJ SOLN
INTRAMUSCULAR | Status: AC
  Filled 2019-03-31: qty 1

## 2019-03-31 MED ORDER — CEFAZOLIN SODIUM-DEXTROSE 2-4 GM/100ML-% IV SOLN
INTRAVENOUS | Status: AC
  Filled 2019-03-31: qty 100

## 2019-03-31 MED ORDER — DEXAMETHASONE SODIUM PHOSPHATE 10 MG/ML IJ SOLN
INTRAMUSCULAR | Status: DC | PRN
  Administered 2019-03-31: 10 mg via INTRAVENOUS

## 2019-03-31 MED ORDER — PROPOFOL 10 MG/ML IV BOLUS
INTRAVENOUS | Status: DC | PRN
  Administered 2019-03-31: 200 mg via INTRAVENOUS

## 2019-03-31 MED ORDER — FENTANYL CITRATE (PF) 100 MCG/2ML IJ SOLN
INTRAMUSCULAR | Status: AC
  Administered 2019-03-31: 25 ug via INTRAVENOUS
  Filled 2019-03-31: qty 2

## 2019-03-31 MED ORDER — ACETAMINOPHEN 10 MG/ML IV SOLN
INTRAVENOUS | Status: AC
  Filled 2019-03-31: qty 100

## 2019-03-31 MED ORDER — MIDAZOLAM HCL 2 MG/2ML IJ SOLN
INTRAMUSCULAR | Status: DC | PRN
  Administered 2019-03-31: 2 mg via INTRAVENOUS

## 2019-03-31 MED ORDER — ONDANSETRON HCL 4 MG/2ML IJ SOLN
INTRAMUSCULAR | Status: DC | PRN
  Administered 2019-03-31: 4 mg via INTRAVENOUS

## 2019-03-31 MED ORDER — FENTANYL CITRATE (PF) 100 MCG/2ML IJ SOLN
25.0000 ug | INTRAMUSCULAR | Status: DC | PRN
  Administered 2019-03-31: 13:00:00 25 ug via INTRAVENOUS
  Administered 2019-03-31: 50 ug via INTRAVENOUS
  Administered 2019-03-31: 25 ug via INTRAVENOUS

## 2019-03-31 MED ORDER — CEFAZOLIN SODIUM-DEXTROSE 2-4 GM/100ML-% IV SOLN: 2.0000 g | INTRAVENOUS | Status: DC

## 2019-03-31 MED ORDER — LIDOCAINE-EPINEPHRINE 1 %-1:100000 IJ SOLN
INTRAMUSCULAR | Status: AC
  Filled 2019-03-31: qty 1

## 2019-03-31 MED ORDER — CHLORHEXIDINE GLUCONATE CLOTH 2 % EX PADS
6.0000 | MEDICATED_PAD | Freq: Once | CUTANEOUS | Status: AC
  Administered 2019-03-31: 09:00:00 6 via TOPICAL

## 2019-03-31 MED ORDER — BACITRACIN 50000 UNITS IM SOLR
INTRAMUSCULAR | Status: AC
  Filled 2019-03-31: qty 1

## 2019-03-31 SURGICAL SUPPLY — 45 items
BINDER BREAST LRG (GAUZE/BANDAGES/DRESSINGS) IMPLANT
BINDER BREAST MEDIUM (GAUZE/BANDAGES/DRESSINGS) ×2 IMPLANT
BINDER BREAST XLRG (GAUZE/BANDAGES/DRESSINGS) IMPLANT
BINDER BREAST XXLRG (GAUZE/BANDAGES/DRESSINGS) ×4 IMPLANT
BLADE BOVIE TIP EXT 4 (BLADE) ×4 IMPLANT
BLADE SURG 15 STRL LF DISP TIS (BLADE) ×2 IMPLANT
BLADE SURG 15 STRL SS (BLADE) ×2
CANISTER SUCT 3000ML PPV (MISCELLANEOUS) ×4 IMPLANT
CHLORAPREP W/TINT 26 (MISCELLANEOUS) ×8 IMPLANT
COVER WAND RF STERILE (DRAPES) ×4 IMPLANT
DERMABOND ADVANCED (GAUZE/BANDAGES/DRESSINGS) ×4
DERMABOND ADVANCED .7 DNX12 (GAUZE/BANDAGES/DRESSINGS) ×4 IMPLANT
DRAPE CHEST BREAST 15X10 FENES (DRAPES) ×8 IMPLANT
ELECT REM PT RETURN 9FT ADLT (ELECTROSURGICAL) ×4
ELECTRODE REM PT RTRN 9FT ADLT (ELECTROSURGICAL) ×2 IMPLANT
GLOVE BIO SURGEON STRL SZ 6.5 (GLOVE) ×12 IMPLANT
GLOVE BIO SURGEONS STRL SZ 6.5 (GLOVE) ×4
GOWN STRL REUS W/ TWL LRG LVL3 (GOWN DISPOSABLE) ×4 IMPLANT
GOWN STRL REUS W/TWL LRG LVL3 (GOWN DISPOSABLE) ×4
IMPL BREAST GEL 535CC (Breast) IMPLANT
IMPLANT BREAST GEL 535CC (Breast) ×8 IMPLANT
KIT TURNOVER KIT A (KITS) ×4 IMPLANT
NDL FILTER BLUNT 18X1 1/2 (NEEDLE) ×2 IMPLANT
NDL SAFETY ECLIPSE 18X1.5 (NEEDLE) ×2 IMPLANT
NEEDLE FILTER BLUNT 18X 1/2SAF (NEEDLE) ×2
NEEDLE FILTER BLUNT 18X1 1/2 (NEEDLE) ×2 IMPLANT
NEEDLE HYPO 18GX1.5 SHARP (NEEDLE) ×2
NEEDLE HYPO 22GX1.5 SAFETY (NEEDLE) ×4 IMPLANT
NS IRRIG 1000ML POUR BTL (IV SOLUTION) ×8 IMPLANT
PACK BASIN MAJOR ARMC (MISCELLANEOUS) ×4 IMPLANT
PAD ABD DERMACEA PRESS 5X9 (GAUZE/BANDAGES/DRESSINGS) ×8 IMPLANT
SIZER BREAST REUSE 535CC (SIZER) ×2
SIZER BRST REUSE ULT HI 535CC (SIZER) IMPLANT
SUT MNCRL 3-0 UNDYED SH (SUTURE) ×4 IMPLANT
SUT MNCRL 4-0 (SUTURE) ×4
SUT MNCRL 4-0 27XMFL (SUTURE) ×4
SUT MNCRL+ 5-0 UNDYED PC-3 (SUTURE) ×4 IMPLANT
SUT MONOCRYL 3-0 UNDYED (SUTURE) ×4
SUT MONOCRYL 5-0 (SUTURE) ×4
SUT VIC AB 3-0 SH 27 (SUTURE) ×2
SUT VIC AB 3-0 SH 27X BRD (SUTURE) IMPLANT
SUTURE MNCRL 4-0 27XMF (SUTURE) ×4 IMPLANT
SYR 10ML LL (SYRINGE) ×8 IMPLANT
SYR BULB IRRIG 60ML STRL (SYRINGE) IMPLANT
TOWEL OR 17X26 4PK STRL BLUE (TOWEL DISPOSABLE) ×8 IMPLANT

## 2019-03-31 NOTE — Anesthesia Preprocedure Evaluation (Signed)
Anesthesia Evaluation  Patient identified by MRN, date of birth, ID band Patient awake    Reviewed: Allergy & Precautions, H&P , NPO status , Patient's Chart, lab work & pertinent test results  History of Anesthesia Complications Negative for: history of anesthetic complications  Airway Mallampati: II  TM Distance: >3 FB Neck ROM: full    Dental no notable dental hx. (+) Teeth Intact   Pulmonary neg pulmonary ROS, neg shortness of breath,    Pulmonary exam normal breath sounds clear to auscultation       Cardiovascular Exercise Tolerance: Good (-) Past MI negative cardio ROS Normal cardiovascular exam Rhythm:regular Rate:Normal     Neuro/Psych  Headaches, negative psych ROS   GI/Hepatic negative GI ROS, Neg liver ROS, neg GERD  ,  Endo/Other  negative endocrine ROS  Renal/GU negative Renal ROS  negative genitourinary   Musculoskeletal   Abdominal   Peds  Hematology negative hematology ROS (+)   Anesthesia Other Findings History of breast cancer.  Past Medical History: No date: Abnormal breast biopsy     Comment:  PASH, Dr. 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     Comment:  increased risk of breast and colon cancer   Reproductive/Obstetrics negative OB ROS                             Anesthesia Physical  Anesthesia Plan  ASA: II  Anesthesia Plan: General   Post-op Pain Management:    Induction: Intravenous  PONV Risk Score and Plan: Ondansetron, Dexamethasone, Midazolam, Promethazine and  Treatment may vary due to age or medical condition  Airway Management Planned: LMA  Additional Equipment:   Intra-op Plan:   Post-operative Plan: Extubation in OR  Informed Consent: I have reviewed the patients History and Physical, chart, labs and discussed the procedure including the risks, benefits and alternatives for the proposed anesthesia with the patient or authorized representative who has indicated his/her understanding and acceptance.     Dental Advisory Given  Plan Discussed with: Anesthesiologist, CRNA and Surgeon  Anesthesia Plan Comments:         Anesthesia Quick Evaluation  

## 2019-03-31 NOTE — Anesthesia Post-op Follow-up Note (Signed)
Anesthesia QCDR form completed.        

## 2019-03-31 NOTE — Op Note (Signed)
Op report Bilateral Exchange   DATE OF OPERATION: 03/31/2019  LOCATION: [facility]  SURGICAL DIVISION: Plastic Surgery  PREOPERATIVE DIAGNOSES:  1.History of breast cancer.  2. Acquired absence of bilateral breast.   POSTOPERATIVE DIAGNOSES:  1. History of breast cancer.  2. Acquired absence of bilateral breast.   PROCEDURE:  1. Bilateral exchange of tissue expanders for implants.  2. Bilateral capsulotomies for implant respositioning.  SURGEON: Keiandre Cygan Sanger Ivyonna Hoelzel, DO  ASSISTANT: Keenan Bachelor, PA  ANESTHESIA:  General.   COMPLICATIONS: None.   IMPLANTS: Left - Mentor Smooth Round High Profile Gel 535 cc. Ref #[id number].  Serial Number [id number] Right - Mentor Smooth Round High Profile Gel 535 cc. Ref #[id number].  Serial Number [id number]  INDICATIONS FOR PROCEDURE:  The patient, Denise [name], is a 43 y.o. female born on 06-16-1976, is here for treatment after bilateral mastectomies.  She had tissue expanders placed at the time of mastectomies. She now presents for exchange of her expanders for implants.  She requires capsulotomies to better position the implants. MRN: [id number]  CONSENT:  Informed consent was obtained directly from the patient. Risks, benefits and alternatives were fully discussed. Specific risks including but not limited to bleeding, infection, hematoma, seroma, scarring, pain, implant infection, implant extrusion, capsular contracture, asymmetry, wound healing problems, and need for further surgery were all discussed. The patient did have an ample opportunity to have her questions answered to her satisfaction.   DESCRIPTION OF PROCEDURE:  The patient was taken to the operating room. SCDs were placed and IV antibiotics were given. The patient's chest was prepped and draped in a sterile fashion. A time out was performed and the implants to be used were identified.    On the right breast: One percent Lidocaine with epinephrine was  used to infiltrate at the incision site. The old mastectomy scar was excised.  The mastectomy flaps from the superior and inferior flaps were raised over the pectoralis major muscle for several centimeters to minimize tension for the closure. The pectoralis was split inferior to the skin incision to expose and remove the tissue expander.  Inspection of the pocket showed a normal healthy capsule and good integration of the biologic matrix.  The pocket was irrigated with antibiotic solution.  Circumferential capsulotomies were performed to allow for breast pocket expansion.  Measurements were made and a sizer used to confirm adequate pocket size for the implant dimensions.  Hemostasis was ensured with electrocautery. New gloves were placed. The implant was soaked in antibiotic solution and then placed in the pocket and oriented appropriately. The pectoralis major muscle and capsule on the anterior surface were re-closed with a 3-0 Monocryl suture. The remaining skin was closed with 4-0 Monocryl deep dermal and 5-0 Monocryl subcuticular stitches.   On the left breast: The old mastectomy scar was excised.  The mastectomy flaps from the superior and inferior flaps were raised over the pectoralis major muscle for several centimeters to minimize tension for the closure. The pectoralis was split inferior to the skin incision to expose and remove the tissue expander.  Inspection of the pocket showed a normal healthy capsule and good integration of the biologic matrix.   Circumferential capsulotomies were performed to allow for breast pocket expansion.  Measurements were made and a sizer utilized to confirm adequate pocket size for the implant dimensions.  Hemostasis was ensured with the electrocautery.  New gloves were applied. The implant was soaked in antibiotic solution and placed in the pocket and oriented appropriately. The pectoralis  major muscle and capsule on the anterior surface were re-closed with a 3-0  Monocryl suture. The remaining skin was closed with 4-0 Monocryl deep dermal and 5-0 Monocryl subcuticular stitches.   Port-a-cath removal: Local was injected at the scar.  The #15 blade was used to make the incision at the previous scar site.  The bovie was used to dissect to the port.  The Port was released from the surrounding tissue.  The patient was placed in Trendelenburg.  A 3-0 Vicryl was placed around the catheter.  The catheter was slowly removed while tightening the Vicryl.  Pressure was held.  The deep layers were closed with the 4-0 Monocryl followed by the 5-0 Monocryl.     Dermabond was applied to the incision site. A breast binder and ABDs were placed.  The patient was allowed to wake from anesthesia and taken to the recovery room in satisfactory condition.   The advanced practice practitioner (APP) assisted throughout the case.  The APP was essential in retraction and counter traction when needed to make the case progress smoothly.  This retraction and assistance made it possible to see the tissue plans for the procedure.  The assistance was needed for blood control, tissue re-approximation and assisted with closure of the incision site.

## 2019-03-31 NOTE — Interval H&P Note (Signed)
History and Physical Interval Note:  03/31/2019 10:06 AM  Denise [name]  has presented today for surgery, with the diagnosis of Removal of bilateral breast expanders and placement of silicone implants.  The various methods of treatment have been discussed with the patient and family. After consideration of risks, benefits and other options for treatment, the patient has consented to  Procedure(s) with comments: REMOVAL OF BILATERAL TISSUE EXPANDERS WITH PLACEMENT OF BILATERAL BREAST IMPLANTS (Bilateral) - 2 hours as a surgical intervention.  The patient's history has been reviewed, patient examined, no change in status, stable for surgery.  I have reviewed the patient's chart and labs.  Questions were answered to the patient's satisfaction.     [name] [name]

## 2019-03-31 NOTE — Anesthesia Procedure Notes (Signed)
Procedure Name: LMA Insertion Date/Time: 03/31/2019 12:43 PM Performed by: Timoteo Expose, CRNA Pre-anesthesia Checklist: Patient identified, Emergency Drugs available, Suction available, Patient being monitored and Timeout performed Patient Re-evaluated:Patient Re-evaluated prior to induction Oxygen Delivery Method: Circle system utilized Preoxygenation: Pre-oxygenation with 100% oxygen Induction Type: IV induction LMA: LMA inserted LMA Size: 4.0

## 2019-03-31 NOTE — Discharge Instructions (Signed)
INSTRUCTIONS FOR AFTER SURGERY   You are having surgery.  You will likely have some questions about what to expect following your operation.  The following information will help you and your family understand what to expect when you are discharged from the hospital.  Following these guidelines will help ensure a smooth recovery and reduce risks of complications.   Postoperative instructions include information on: diet, wound care, medications and physical activity.  AFTER SURGERY Expect to go home after the procedure.  In some cases, you may need to spend one night in the hospital for observation.  DIET This surgery does not require a specific diet.  However, I have to mention that the healthier you eat the better your body can start healing. It is important to increasing your protein intake.  This means limiting the foods with sugar and carbohydrates.  Focus on vegetables and some meat.  If you have any liposuction during your procedure be sure to drink water.  If your urine is bright yellow, then it is concentrated, and you need to drink more water.  As a general rule after surgery, you should have 8 ounces of water every hour while awake.  If you find you are persistently nauseated or unable to take in liquids let us know.  NO TOBACCO USE or EXPOSURE.  This will slow your healing process and increase the risk of a wound.  WOUND CARE If you don't have a drain: You can shower the day after surgery if you don't have a drain.  Use fragrance free soap.  Dial, Story and [place] are usually mild on the skin.  If you have a drain: Clean with baby wipes until the drain is removed.   If you have steri-strips / tape directly attached to your skin leave them in place. It is OK to get these wet.  No baths, pools or hot tubs for two weeks. We close your incision to leave the smallest and best-looking scar. No ointment or creams on your incisions until given the go ahead.  Especially not Neosporin (Too many skin  reactions with this one).  A few weeks after surgery you can use Mederma and start massaging the scar. We ask you to wear your binder or sports bra for the first 6 weeks around the clock, including while sleeping. This provides added comfort and helps reduce the fluid accumulation at the surgery site.  ACTIVITY No heavy lifting until cleared by the doctor.  It is OK to walk and climb stairs. In fact, moving your legs is very important to decrease your risk of a blood clot.  It will also help keep you from getting deconditioned.  Every 1 to 2 hours get up and walk for 5 minutes. This will help with a quicker recovery back to normal.  Let pain be your guide so you don't do too much.  NO, you cannot do the spring cleaning and don't plan on taking care of anyone else.  This is your time for TLC.  You will be more comfortable if you sleep and rest with your head elevated either with a few pillows under you or in a recliner.  No stomach sleeping for a few months.  WORK Everyone returns to work at different times. As a rough guide, most people take at least 1 - 2 weeks off prior to returning to work. If you need documentation for your job, bring the forms to your postoperative follow up visit.  DRIVING Arrange for someone to  bring you home from the hospital.  You may be able to drive a few days after surgery but not while taking any narcotics or valium.  BOWEL MOVEMENTS Constipation can occur after anesthesia and while taking pain medication.  It is important to stay ahead for your comfort.  We recommend taking Milk of Magnesia (2 tablespoons; twice a day) while taking the pain pills.  SEROMA This is fluid your body tried to put in the surgical site.  This is normal but if it creates tight skinny skin let us know.  It usually decreases in a few weeks.  WHEN TO CALL Call your surgeon's office if any of the following occur:  Fever 101 degrees F or greater  Excessive bleeding or fluid from the incision  site.  Pain that increases over time without aid from the medications  Redness, warmth, or pus draining from incision sites  Persistent nausea or inability to take in liquids  Severe misshapen area that underwent the operation.

## 2019-03-31 NOTE — Transfer of Care (Signed)
Immediate Anesthesia Transfer of Care Note  Patient: Denise [name]  Procedure(s) Performed: REMOVAL OF BILATERAL TISSUE EXPANDERS WITH PLACEMENT OF BILATERAL BREAST IMPLANTS (Bilateral Breast) REMOVAL PORT-A-CATH (Right Chest)  Patient Location: PACU  Anesthesia Type:General  Level of Consciousness: alert   Airway & Oxygen Therapy: Patient Spontanous Breathing  Post-op Assessment: Report given to RN  Post vital signs: Reviewed  Last Vitals:  Vitals Value Taken Time  BP 126/67 03/31/19 1234  Temp 36.1 C 03/31/19 1234  Pulse 105 03/31/19 1242  Resp 18 03/31/19 1242  SpO2 100 % 03/31/19 1242  Vitals shown include unvalidated device data.  Last Pain:  Vitals:   03/31/19 1234  TempSrc:   PainSc: Asleep         Complications: No apparent anesthesia complications

## 2019-04-01 ENCOUNTER — Encounter: Payer: Self-pay | Admitting: Plastic Surgery

## 2019-04-01 NOTE — Anesthesia Postprocedure Evaluation (Signed)
Anesthesia Post Note  Patient: Denise [name]  Procedure(s) Performed: REMOVAL OF BILATERAL TISSUE EXPANDERS WITH PLACEMENT OF BILATERAL BREAST IMPLANTS (Bilateral Breast) REMOVAL PORT-A-CATH (Right Chest)  Patient location during evaluation: PACU Anesthesia Type: General Level of consciousness: awake and alert and oriented Pain management: pain level controlled Vital Signs Assessment: post-procedure vital signs reviewed and stable Respiratory status: spontaneous breathing Cardiovascular status: blood pressure returned to baseline Anesthetic complications: no     Last Vitals:  Vitals:   03/31/19 1314 03/31/19 1326  BP: 124/71 124/84  Pulse: 92 74  Resp: 19 18  Temp: 36.6 C 36.4 C  SpO2: 98% 100%    Last Pain:  Vitals:   03/31/19 1326  TempSrc:   PainSc: 3                  Hairo Garraway

## 2019-04-04 ENCOUNTER — Other Ambulatory Visit: Payer: Self-pay

## 2019-04-07 ENCOUNTER — Ambulatory Visit
Admission: RE | Admit: 2019-04-07 | Discharge: 2019-04-07 | Disposition: A | Discharge status: Home / Self Care | Payer: BC Managed Care – PPO | Source: Ambulatory Visit | Attending: Radiation Oncology | Admitting: Radiation Oncology

## 2019-04-07 ENCOUNTER — Encounter: Payer: Self-pay | Admitting: Radiation Oncology

## 2019-04-07 ENCOUNTER — Other Ambulatory Visit: Payer: Self-pay

## 2019-04-07 VITALS — BP 132/79 | HR 80 | Temp 96.9°F | Resp 16 | Wt 186.2 lb

## 2019-04-07 DIAGNOSIS — Z923 Personal history of irradiation: Secondary | ICD-10-CM | POA: Diagnosis not present

## 2019-04-07 DIAGNOSIS — Z79810 Long term (current) use of selective estrogen receptor modulators (SERMs): Secondary | ICD-10-CM | POA: Diagnosis not present

## 2019-04-07 DIAGNOSIS — Z79899 Other long term (current) drug therapy: Secondary | ICD-10-CM | POA: Insufficient documentation

## 2019-04-07 DIAGNOSIS — Z170 Estrogen receptor positive status [ER+]: Secondary | ICD-10-CM | POA: Diagnosis not present

## 2019-04-07 DIAGNOSIS — C50411 Malignant neoplasm of upper-outer quadrant of right female breast: Secondary | ICD-10-CM | POA: Diagnosis not present

## 2019-04-07 NOTE — Progress Notes (Signed)
Radiation Oncology Follow up Note  Name: Denise [name]   Date:   04/07/2019 MRN:  [id number] DOB: Mar 13, 1976    This 43 y.o. 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 REFERRING PROVIDER: [name],*  HPI: Patient is a 43 year old female now at 7 months having completed left chest wall peripheral emphatic radiation status post left modified radical mastectomy for T1 N1 M0 triple positive invasive mammary carcinoma seen today in routine follow-up she is doing well she has had bilateral mastectomies and has recently undergone bilateral implants which she is tolerated well.  She specifically denies chest wall tenderness cough bone pain or any swelling in her left upper extremity..  She is currently on tamoxifen tolerating that well.  COMPLICATIONS OF TREATMENT: none  FOLLOW UP COMPLIANCE: keeps appointments   PHYSICAL EXAM:  BP 132/79 (BP Location: Left Arm, Patient Position: Sitting)   Pulse 80   Temp (!) 96.9 F (36.1 C) (Tympanic)   Resp 16   Wt 186 lb 3.2 oz (84.5 kg)   BMI 29.16 kg/m  Patient is status post bilateral breast reconstruction with excellent result.  No dominant mass or nodularity is noted in either reconstructed breast.  No axillary or supraclavicular adenopathy identified.  No swelling of her left upper extremity is noted.  Well-developed well-nourished patient in NAD. HEENT reveals PERLA, EOMI, discs not visualized.  Oral cavity is clear. No oral mucosal lesions are identified. Neck is clear without evidence of cervical or supraclavicular adenopathy. Lungs are clear to A&P. Cardiac examination is essentially unremarkable with regular rate and rhythm without murmur rub or thrill. Abdomen is benign with no organomegaly or masses noted. Motor sensory and DTR  levels are equal and symmetric in the upper and lower extremities. Cranial nerves II through XII are grossly intact. Proprioception is intact. No peripheral adenopathy or edema is identified. No motor or sensory levels are noted. Crude visual fields are within normal range.  RADIOLOGY RESULTS: No current films to review  PLAN: Present time patient is doing well 28-month out from left chest wall peripheral emphatic radiation with no significant side effects.  I am pleased with her overall progress.  Based on the fact she does not have breast to examine at this point and we did turn follow-up care over to medical oncology.  I would be happy to reevaluate the patient in the future should any problems develop.  Patient knows to call with any concerns.  I would like to take this opportunity to thank you for allowing me to participate in the care of your patient.Noreene Filbert, MD

## 2019-04-08 ENCOUNTER — Ambulatory Visit (INDEPENDENT_AMBULATORY_CARE_PROVIDER_SITE_OTHER): Payer: BC Managed Care – PPO | Admitting: Plastic Surgery

## 2019-04-08 VITALS — BP 126/83 | HR 75 | Temp 97.5°F | Ht 67.0 in | Wt 183.0 lb

## 2019-04-08 DIAGNOSIS — Z9012 Acquired absence of left breast and nipple: Secondary | ICD-10-CM

## 2019-04-08 DIAGNOSIS — Z9889 Other specified postprocedural states: Secondary | ICD-10-CM

## 2019-04-08 DIAGNOSIS — Z9011 Acquired absence of right breast and nipple: Secondary | ICD-10-CM

## 2019-04-08 DIAGNOSIS — N651 Disproportion of reconstructed breast: Secondary | ICD-10-CM

## 2019-04-08 NOTE — Addendum Note (Signed)
Addended by: [name] on: 04/08/2019 11:27 AM   Modules accepted: Orders

## 2019-04-08 NOTE — Progress Notes (Signed)
   Subjective:    Patient ID: Denise [name], female    DOB: 11-23-1975, 43 y.o.   MRN: [id number]  The patient is a 43 year old female here for after undergoing bilateral expander exchange to implants.  She was a little nervous about the way they looked.  She states that they have gotten better over last several days.  There is still a fair bit of swelling.  The left side is more swollen than there is no sign of infection and the incisions are healing well.   Review of Systems  Constitutional: Negative.   HENT: Negative.   Eyes: Negative.   Respiratory: Negative.   Cardiovascular: Negative.   Gastrointestinal: Negative.   Genitourinary: Negative.   Musculoskeletal: Negative.        Objective:   Physical Exam Vitals signs and nursing note reviewed.  Constitutional:      Appearance: Normal appearance.  Cardiovascular:     Rate and Rhythm: Normal rate.     Pulses: Normal pulses.  Neurological:     General: No focal deficit present.     Mental Status: She is alert and oriented to person, place, and time.  Psychiatric:        Mood and Affect: Mood normal.        Behavior: Behavior normal.        Assessment & Plan:     ICD-10-CM   1. Breast asymmetry following reconstructive surgery  N65.1   2. 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 Acquired absence of left breast  Z90.12   4. S/P breast reconstruction, bilateral  Z98.890     Can go into a sports bra.  Can increase activity daily.  Still no heavy lifting but can increase according to ability.  I would like to see her back in 1 month.

## 2019-04-10 ENCOUNTER — Other Ambulatory Visit: Payer: Self-pay

## 2019-04-10 ENCOUNTER — Ambulatory Visit: Payer: BC Managed Care – PPO

## 2019-04-10 DIAGNOSIS — M25512 Pain in left shoulder: Secondary | ICD-10-CM | POA: Diagnosis not present

## 2019-04-10 DIAGNOSIS — M25511 Pain in right shoulder: Secondary | ICD-10-CM

## 2019-04-10 NOTE — Therapy (Addendum)
[facility] [address], [place], [address] Phone: [phone]   Fax:  [phone]  Physical Therapy Treatment  Patient Details  Name: Denise [name] MRN: [id number] Date of Birth: 08/28/1975 Referring Provider (PT): Dillingham   Encounter Date: 04/10/2019  PT End of Session - 04/10/19 1731    Visit Number  24    Number of Visits  49    Date for PT Re-Evaluation  04/18/19    PT Start Time  1611    PT Stop Time  1645    PT Time Calculation (min)  34 min    Activity Tolerance  Patient tolerated treatment well;No increased pain    Behavior During Therapy  WFL for tasks assessed/performed       Past Medical History:  Diagnosis Date  . Abnormal breast biopsy    PASH, Dr. Lemar Livings  . Breast cancer (HCC) 11/2017   left breast; ER/PR/Her2neu POS  . Family history of breast cancer   . Family history of ovarian cancer   . Fibroadenoma of breast, left 2000, 2016  . Genetic screening 08/26/2013   BRCA/BART negative/[facility], CHEK2 POS 2017  . Increased risk of breast cancer 2017   IBIS=47%  . Left breast lump 06/18/2017   Fluid/drained [name]  . Migraine   . Monoallelic mutation of CHEK2 gene in female patient 2017   increased risk of breast and colon cancer  . Personal history of chemotherapy   . Personal history of radiation therapy     Past Surgical History:  Procedure Laterality Date  . BREAST BIOPSY Bilateral 09/08/14   neg  . BREAST BIOPSY Left 03/16/2015   Procedure: BREAST BIOPSY WITH NEEDLE LOCALIZATION;  Surgeon: Earline Mayotte, MD;  Location: [facility] ORS;  Service: General;  Laterality: Left;  . BREAST BIOPSY Left 11/2017   positive  . BREAST CYST ASPIRATION Left 06/18/2017   cyst aspiration  . BREAST EXCISIONAL BIOPSY Left 09/2014  . BREAST EXCISIONAL BIOPSY Right    age 73's  . BREAST RECONSTRUCTION WITH PLACEMENT OF TISSUE EXPANDER AND FLEX HD (ACELLULAR HYDRATED DERMIS) Left  05/20/2018   Procedure: BREAST RECONSTRUCTION WITH PLACEMENT OF TISSUE EXPANDER AND FLEX HD (ACELLULAR HYDRATED DERMIS);  Surgeon: Peggye Form, DO;  Location: [facility] ORS;  Service: Plastics;  Laterality: Left;  . BREAST RECONSTRUCTION WITH PLACEMENT OF TISSUE EXPANDER AND FLEX HD (ACELLULAR HYDRATED DERMIS) Right 12/09/2018   Procedure: BREAST RECONSTRUCTION WITH PLACEMENT OF TISSUE EXPANDER AND FLEX HD (ACELLULAR HYDRATED DERMIS);  Surgeon: Peggye Form, DO;  Location: [facility] ORS;  Service: Plastics;  Laterality: Right;  . BREAST SURGERY Right March 2000   benign fibroadenoma  . BREAST SURGERY Left 08/08/13   excision  . BREAST SURGERY Left 09/23/14   excision  . ESOPHAGOGASTRODUODENOSCOPY (EGD) WITH PROPOFOL N/A 01/20/2019   Procedure: ESOPHAGOGASTRODUODENOSCOPY (EGD) WITH PROPOFOL;  Surgeon: Toney Reil, MD;  Location: [facility] END[facility];  Service: Gastroenterology;  Laterality: N/A;  . MASTECTOMY Left 05/20/2018  . MASTECTOMY W/ SENTINEL NODE BIOPSY Left 05/20/2018   Procedure: MASTECTOMY WITH SENTINEL LYMPH NODE BIOPSY;  Surgeon: Earline Mayotte, MD;  Location: [facility] ORS;  Service: General;  Laterality: Left;  . PORT-A-CATH REMOVAL Right 03/31/2019   Procedure: REMOVAL PORT-A-CATH;  Surgeon: Peggye Form, DO;  Location: [facility] OR[facility];  Service: Plastics;  Laterality: Right;  . PORTACATH PLACEMENT Right 12/17/2017   Procedure: INSERTION PORT-A-CATH;  Surgeon: Earline Mayotte, MD;  Location: [facility] ORS;  Service: General;  Laterality: Right;  . REMOVAL OF BILATERAL TISSUE EXPANDERS WITH PLACEMENT OF BILATERAL BREAST IMPLANTS Bilateral 03/31/2019   Procedure: REMOVAL OF BILATERAL TISSUE EXPANDERS WITH PLACEMENT OF BILATERAL BREAST IMPLANTS;  Surgeon: Peggye Form, DO;  Location: [facility] ORS;  Service: Plastics;  Laterality: Bilateral;  2 hours  . SIMPLE MASTECTOMY WITH AXILLARY SENTINEL NODE BIOPSY Right 12/09/2018   Procedure: SIMPLE MASTECTOMY RIGHT;  Surgeon: Earline Mayotte, MD;  Location: [facility] ORS;  Service: General;  Laterality: Right;    There were no vitals filed for this visit.  Subjective Assessment - 04/10/19 1713    Subjective  Pt reports she had her extender exchange procedure about 2WA, now cleared to drink a gallon of milk. Pt rpeorts no ROM restrictions. She has limited he rHEP activity while in convolescence.    Pertinent History  Patient is a 43 year old female with breast cancer history beginning in 2016 with multiple benign lumpectomies. Patient reports first malignant finding this past June 2019 in L breast, with following masectomy with saline expander placed 05/20/18. Patient reports chemo prior with radiation beginning next week 5days/week, for 6 weeks. Patient reports she is also planning to have R masectomy with saline expansion in the future, unscheduled. 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 Patient reports she is a mother of a 6 year old and 46 year old that she reports are very involved in extra-curriculars. Patient reports her ROM in the shoulder is improving, but that  she is having some pain in the inside elbow and into medial forearm with straightening. Patient reports some TTP at incision site, but most all pain at elbow into forearm. Worst pain in past week 7/10 and best 0/10.  Reports pain is sharp in the medial elbow and forearm; denies numbness or tingling    Limitations  House hold activities;Lifting    How long can you sit comfortably?  unlimited    How long can you stand comfortably?  unlimited    How long can you walk comfortably?  unlimited    Currently in Pain?  No/denies        INTERVENTION THIS DATE: -Quadruped to child's pose flexion ROM stretch 15x10secH (close to 170 degrees bilat)  circumferential measurement of BUE at wrist, forearm, and brachium, 1cm avg increased on dominant side; additional education from [place] on lymphedema for precautionary information. No concerns at this  time -standing high row 2x15 c 15lb resistance -seated lat pulldown 2x15 at 30lb resistance -bilat shoulder abdct 2x15 c 1lb free weights  -bicep curl to overhead press 1x12 c 3lb weights      PT Short Term Goals - 02/20/19 1624      PT SHORT TERM GOAL #1   Title  Pt will be independent with HEP in order to improve strength and motion, and to decrease pain in order to improve pain-free function at home and work.    Baseline  Completes HEP without questions or concerns    Time  4    Period  Weeks    Status  Achieved        PT Long Term Goals - 02/20/19 1625      PT LONG TERM GOAL #1   Title  Pt will decrease worst pain as reported on NPRS by at least 3 points in order to demonstrate clinically significant reduction in pain.    Baseline  02/20/19 2/10    Time  8    Period  Weeks  Status  Achieved      PT LONG TERM GOAL #2   Title  Patient will increase FOTO score to 72 to demonstrate predicted increase in functional mobility to complete ADLs    Baseline  02/20/19    Time  8    Period  Weeks    Status  Achieved      PT LONG TERM GOAL #3   Title  Patient will demonstrate all active, painfree shoulder ROM within normal limits in order to complete ADLs    Baseline  02/20/19 R IR to T12 and full but "tense" with R shoulder abd    Time  8    Period  Weeks    Status  On-going      PT LONG TERM GOAL #4   Title  Pt will increase strength by at least 1/2 MMT grade in order to demonstrate improvement in strength and function    Baseline  02/20/19: R/L Y bilat T 4 bilat  Lats 4- bilat    Time  8    Period  Weeks    Status  New            Plan - 04/10/19 1733    Clinical Impression Statement  Pt doing well in general, remain slightly sore on left side from procedure. Although shoulder ROM is improving, noted motor pattern this date with pt avoiding scapulo thoracic component of extension/lateral flexion/rotation with overhead movements. Author took time to explain to patient and  encourage gentle engagement of these limitations.  ROM actually looks very good given time off from stretching and procedure. With progression of mobility, comenced with low level resistance training, well within tolerance of patient. Pt conitnues to make steady progress toward goals in general.    Rehab Potential  Good    Clinical Impairments Affecting Rehab Potential  (+) age, motivation, social support, (-) current sedentary lifestyle, ongoing cancer treatment, other comorbidities    PT Frequency  2x / week    PT Duration  8 weeks    PT Treatment/Interventions  ADLs/Self Care Home Management;Aquatic Therapy;Electrical Stimulation;Cryotherapy;Moist Heat;Iontophoresis 4mg /ml Dexamethasone;Functional mobility training;Therapeutic activities;Therapeutic exercise;Traction;Ultrasound;Neuromuscular re-education;Manual techniques;Patient/family education;Manual lymph drainage;Passive range of motion;Dry needling;Energy conservation;Taping    PT Next Visit Plan  Ease soft tissue restrictions, increase ROM as able    PT Home Exercise Plan  pulleys shoulder abd/flex, sidelying lat stretch, supine ER stretch, scar massage    Consulted and Agree with Plan of Care  Patient       Patient will benefit from skilled therapeutic intervention in order to improve the following deficits and impairments:  Decreased activity tolerance, Decreased endurance, Decreased range of motion, Decreased skin integrity, Hypomobility, Increased fascial restricitons, Impaired UE functional use, Improper body mechanics, Pain, Postural dysfunction, Impaired flexibility, Decreased scar mobility  Visit Diagnosis: Acute pain of right shoulder     Problem List Patient Active Problem List   Diagnosis Date Noted  . Iron deficiency anemia due to chronic blood loss 02/14/2019  . Melena   . Epistaxis 01/08/2019  . Acquired absence of right breast 12/17/2018  . Admission for breast reconstruction following mastectomy 12/09/2018  . Use  of tamoxifen (Nolvadex) 12/04/2018  . Acquired absence of left breast 09/03/2018  . Visual changes 08/20/2018  . Breast asymmetry following reconstructive surgery 08/13/2018  . Hot flashes due to menopause 06/18/2018  . Status post left mastectomy 06/04/2018  . S/P breast reconstruction, bilateral 05/28/2018  . Vasomotor symptoms due to menopause 04/17/2018  . Elevated LFTs  03/17/2018  . Goals of care, counseling/discussion 03/17/2018  . Hypomagnesemia 03/04/2018  . Diarrhea 01/02/2018  . Encounter for antineoplastic chemotherapy 12/21/2017  . Encounter for antineoplastic immunotherapy 12/21/2017  . 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 Breast mass, right 12/05/2016  . Monoallelic mutation of CHEK2 gene in female patient 06/20/2015  . Malignant neoplasm of upper-outer quadrant of female breast (HCC) 09/17/2014   5:42 PM, 04/10/19 Rosamaria Lints, PT, DPT Physical Therapist - [place] [phone] (Office)    Daxter Paule C 04/10/2019, 5:38 PM  [facility] SPORTS MEDICINE [address], [place], [address] Phone: [phone]   Fax:  [phone]  Name: Denise [name] MRN: [id number] Date of Birth: 02-05-1976

## 2019-04-11 ENCOUNTER — Encounter: Payer: BC Managed Care – PPO | Admitting: Surgical

## 2019-04-15 ENCOUNTER — Other Ambulatory Visit: Payer: Self-pay

## 2019-04-15 ENCOUNTER — Ambulatory Visit: Payer: BC Managed Care – PPO | Admitting: Physical Therapy

## 2019-04-15 ENCOUNTER — Encounter: Payer: Self-pay | Admitting: Physical Therapy

## 2019-04-15 DIAGNOSIS — M25512 Pain in left shoulder: Secondary | ICD-10-CM

## 2019-04-15 DIAGNOSIS — M25511 Pain in right shoulder: Secondary | ICD-10-CM

## 2019-04-15 NOTE — Therapy (Signed)
[facility] [address] [place], [address] Phone: [phone]   Fax:  [phone]  Physical Therapy Treatment  Patient Details  Name: Denise [name] MRN: [id number] Date of Birth: 10-12-1975 Referring Provider (PT): Dillingham   Encounter Date: 04/15/2019  PT End of Session - 04/15/19 1745    Visit Number  25    Number of Visits  49    Date for PT Re-Evaluation  04/18/19    PT Start Time  0500    PT Stop Time  6073    PT Time Calculation (min)  45 min    Activity Tolerance  Patient tolerated treatment well;No increased pain    Behavior During Therapy  WFL for tasks assessed/performed       Past Medical History:  Diagnosis Date  . Abnormal breast biopsy    PASH, [name]  . Breast cancer ([place]) 11/2017   left breast; ER/PR/Her2neu POS  . Family history of breast cancer   . Family history of ovarian cancer   . Fibroadenoma of breast, left 2000, 2016  . Genetic screening 08/26/2013   BRCA/BART negative/Myriad, CHEK2 POS 2017  . Increased risk of breast cancer 2017   IBIS=47%  . Left breast lump 06/18/2017   Fluid/drained [name]  . Migraine   . Monoallelic mutation of CHEK2 gene in female patient 2017   increased risk of breast and colon cancer  . Personal history of chemotherapy   . Personal history of radiation therapy     Past Surgical History:  Procedure Laterality Date  . BREAST BIOPSY Bilateral 09/08/14   neg  . BREAST BIOPSY Left 03/16/2015   Procedure: BREAST BIOPSY WITH NEEDLE LOCALIZATION;  Surgeon: [name], MD;  Location: [facility];  Service: General;  Laterality: Left;  . BREAST BIOPSY Left 11/2017   positive  . BREAST CYST ASPIRATION Left 06/18/2017   cyst aspiration  . BREAST EXCISIONAL BIOPSY Left 09/2014  . BREAST EXCISIONAL BIOPSY Right    age 78's  . BREAST RECONSTRUCTION WITH PLACEMENT OF TISSUE EXPANDER AND FLEX HD (ACELLULAR HYDRATED DERMIS) Left  05/20/2018   Procedure: BREAST RECONSTRUCTION WITH PLACEMENT OF TISSUE EXPANDER AND FLEX HD (ACELLULAR HYDRATED DERMIS);  Surgeon: [name], DO;  Location: [facility];  Service: Plastics;  Laterality: Left;  . BREAST RECONSTRUCTION WITH PLACEMENT OF TISSUE EXPANDER AND FLEX HD (ACELLULAR HYDRATED DERMIS) Right 12/09/2018   Procedure: BREAST RECONSTRUCTION WITH PLACEMENT OF TISSUE EXPANDER AND FLEX HD (ACELLULAR HYDRATED DERMIS);  Surgeon: [name], DO;  Location: [facility];  Service: Plastics;  Laterality: Right;  . BREAST SURGERY Right March 2000   benign fibroadenoma  . BREAST SURGERY Left 08/08/13   excision  . BREAST SURGERY Left 09/23/14   excision  . ESOPHAGOGASTRODUODENOSCOPY (EGD) WITH PROPOFOL N/A 01/20/2019   Procedure: ESOPHAGOGASTRODUODENOSCOPY (EGD) WITH PROPOFOL;  Surgeon: [name], MD;  Location: [facility];  Service: Gastroenterology;  Laterality: N/A;  . MASTECTOMY Left 05/20/2018  . MASTECTOMY W/ SENTINEL NODE BIOPSY Left 05/20/2018   Procedure: MASTECTOMY WITH SENTINEL LYMPH NODE BIOPSY;  Surgeon: [name], MD;  Location: [facility];  Service: General;  Laterality: Left;  . PORT-A-CATH REMOVAL Right 03/31/2019   Procedure: REMOVAL PORT-A-CATH;  Surgeon: [name], DO;  Location: [facility];  Service: Plastics;  Laterality: Right;  . PORTACATH PLACEMENT Right 12/17/2017   Procedure: INSERTION PORT-A-CATH;  Surgeon: [name], MD;  Location: [facility];  Service: General;  Laterality: Right;  . REMOVAL OF BILATERAL TISSUE EXPANDERS WITH PLACEMENT OF BILATERAL BREAST IMPLANTS Bilateral 03/31/2019   Procedure: REMOVAL OF BILATERAL TISSUE EXPANDERS WITH PLACEMENT OF BILATERAL BREAST IMPLANTS;  Surgeon: [name], DO;  Location: [facility];  Service: Plastics;  Laterality: Bilateral;  2 hours  . SIMPLE MASTECTOMY WITH AXILLARY SENTINEL NODE BIOPSY Right 12/09/2018   Procedure: SIMPLE MASTECTOMY RIGHT;  Surgeon: [name], MD;  Location: [facility];  Service: General;  Laterality: Right;    There were no vitals filed for this visit.  Subjective Assessment - 04/15/19 1705    Subjective  Doing well over all, some tension/siffness with overhead motion along latissimus, 3/10 pain.    Pertinent History  Patient is a 43 year old female with breast cancer history beginning in 2016 with multiple benign lumpectomies. Patient reports first malignant finding this past June 2019 in L breast, with following masectomy with saline expander placed 05/20/18. Patient reports chemo prior with radiation beginning next week 5days/week, for 6 weeks. Patient reports she is also planning to have R masectomy with saline expansion in the future, unscheduled. 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 Patient reports she is a mother of a 65 year old and 59 year old that she reports are very involved in extra-curriculars. Patient reports her ROM in the shoulder is improving, but that  she is having some pain in the inside elbow and into medial forearm with straightening. Patient reports some TTP at incision site, but most all pain at elbow into forearm. Worst pain in past week 7/10 and best 0/10.  Reports pain is sharp in the medial elbow and forearm; denies numbness or tingling    Limitations  House hold activities;Lifting    How long can you sit comfortably?  unlimited    How long can you stand comfortably?  unlimited    How long can you walk comfortably?  unlimited    Patient Stated Goals  Decrease pain    Pain Onset  1 to 4 weeks ago       Manual STM with trigger point release to L latissimus and teres major/minor PROM, only needed for ER/IR with 10sec holds  Ther-Ex -Quadruped to child's pose flexion ROM stretch 2x 30sec - Childs pose R lateral bias 2x 30sec hold - seated lat pulldown 3x 10 at 35# resistance with good form - 90/90 ER with YTB 3x 10 with min cuing to maintain elbow height and scapular  retraction  -bicep curl to overhead press 3x 10 c 4lb weights with min cuing for full ROM, end range scapulo-humeral rhythm with good carry over - Scaption 2# 2x 9 with demo and good carry over - High doorway stretch 73mn                        PT Education - 04/15/19 1715    Education Details  therex    Person(s) Educated  Patient    Methods  Explanation;Demonstration;Verbal cues    Comprehension  Verbalized understanding;Returned demonstration;Verbal cues required       PT Short Term Goals - 02/20/19 1624      PT SHORT TERM GOAL #1   Title  Pt will be independent with HEP in order to improve strength and motion, and to decrease pain in order to improve pain-free function at home and work.    Baseline  Completes HEP without questions or concerns    Time  4    Period  Weeks    Status  Achieved        PT Long Term Goals - 02/20/19 1625      PT LONG TERM GOAL #1   Title  Pt will decrease worst pain as reported on NPRS by at least 3 points in order to demonstrate clinically significant reduction in pain.    Baseline  02/20/19 2/10    Time  8    Period  Weeks    Status  Achieved      PT LONG TERM GOAL #2   Title  Patient will increase FOTO score to 72 to demonstrate predicted increase in functional mobility to complete ADLs    Baseline  02/20/19    Time  8    Period  Weeks    Status  Achieved      PT LONG TERM GOAL #3   Title  Patient will demonstrate all active, painfree shoulder ROM within normal limits in order to complete ADLs    Baseline  02/20/19 R IR to T12 and full but "tense" with R shoulder abd    Time  8    Period  Weeks    Status  On-going      PT LONG TERM GOAL #4   Title  Pt will increase strength by at least 1/2 MMT grade in order to demonstrate improvement in strength and function    Baseline  02/20/19: R/L Y bilat T 4 bilat  Lats 4- bilat    Time  8    Period  Weeks    Status  New            Plan - 04/16/19 1219    Clinical  Impression Statement  PT continued progression for increased ROM and strength. Patient with some difficulty with scapulo-humeral rhythm, but is able to obtain this with cuing. PT will continue progression as able.    Stability/Clinical Decision Making  Evolving/Moderate complexity    Clinical Decision Making  Moderate    Rehab Potential  Good    Clinical Impairments Affecting Rehab Potential  (+) age, motivation, social support, (-) current sedentary lifestyle, ongoing cancer treatment, other comorbidities    PT Frequency  2x / week    PT Duration  8 weeks    PT Treatment/Interventions  ADLs/Self Care Home Management;Aquatic Therapy;Electrical Stimulation;Cryotherapy;Moist Heat;Iontophoresis 73m/ml Dexamethasone;Functional mobility training;Therapeutic activities;Therapeutic exercise;Traction;Ultrasound;Neuromuscular re-education;Manual techniques;Patient/family education;Manual lymph drainage;Passive range of motion;Dry needling;Energy conservation;Taping    PT Next Visit Plan  Ease soft tissue restrictions, increase ROM as able    PT Home Exercise Plan  pulleys shoulder abd/flex, sidelying lat stretch, supine ER stretch, scar massage    Consulted and Agree with Plan of Care  Patient       Patient will benefit from skilled therapeutic intervention in order to improve the following deficits and impairments:  Decreased activity tolerance, Decreased endurance, Decreased range of motion, Decreased skin integrity, Hypomobility, Increased fascial restricitons, Impaired UE functional use, Improper body mechanics, Pain, Postural dysfunction, Impaired flexibility, Decreased scar mobility  Visit Diagnosis: Acute pain of right shoulder  Acute pain of left shoulder     Problem List Patient Active Problem List   Diagnosis Date Noted  . Iron deficiency anemia due to chronic blood loss 02/14/2019  . Melena   . Epistaxis 01/08/2019  . Acquired absence of right breast 12/17/2018  . Admission for  breast reconstruction following mastectomy 12/09/2018  . Use of tamoxifen (Nolvadex) 12/04/2018  . Acquired absence  of left breast 09/03/2018  . Visual changes 08/20/2018  . Breast asymmetry following reconstructive surgery 08/13/2018  . Hot flashes due to menopause 06/18/2018  . Status post left mastectomy 06/04/2018  . S/P breast reconstruction, bilateral 05/28/2018  . Vasomotor symptoms due to menopause 04/17/2018  . Elevated LFTs 03/17/2018  . Goals of care, counseling/discussion 03/17/2018  . Hypomagnesemia 03/04/2018  . Diarrhea 01/02/2018  . Encounter for antineoplastic chemotherapy 12/21/2017  . Encounter for antineoplastic immunotherapy 12/21/2017  . 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 Breast mass, right 12/05/2016  . Monoallelic mutation of CHEK2 gene in female patient 06/20/2015  . Malignant neoplasm of upper-outer quadrant of female breast ([place]) 09/17/2014   Denise [name] PT, DPT Denise [name] 04/16/2019, 12:50 PM  [facility] [address], [place], [address] Phone: [phone]   Fax:  [phone]  Name: Denise [name] MRN: [id number] Date of Birth: 22-Jul-1975

## 2019-04-16 ENCOUNTER — Encounter: Payer: Self-pay | Admitting: Physical Therapy

## 2019-04-17 ENCOUNTER — Ambulatory Visit: Payer: BC Managed Care – PPO | Admitting: Physical Therapy

## 2019-04-22 ENCOUNTER — Ambulatory Visit: Payer: BC Managed Care – PPO | Attending: Plastic Surgery | Admitting: Physical Therapy

## 2019-04-22 DIAGNOSIS — M25511 Pain in right shoulder: Secondary | ICD-10-CM | POA: Insufficient documentation

## 2019-04-22 DIAGNOSIS — M25512 Pain in left shoulder: Secondary | ICD-10-CM | POA: Insufficient documentation

## 2019-04-24 ENCOUNTER — Ambulatory Visit: Payer: BC Managed Care – PPO | Admitting: Physical Therapy

## 2019-04-29 ENCOUNTER — Ambulatory Visit: Payer: BC Managed Care – PPO | Admitting: Physical Therapy

## 2019-04-29 ENCOUNTER — Other Ambulatory Visit: Payer: Self-pay

## 2019-04-29 DIAGNOSIS — M25511 Pain in right shoulder: Secondary | ICD-10-CM | POA: Diagnosis present

## 2019-04-29 DIAGNOSIS — M25512 Pain in left shoulder: Secondary | ICD-10-CM

## 2019-04-29 NOTE — Therapy (Signed)
[facility] [address], [place], [address] Phone: [phone]   Fax:  [phone]  Physical Therapy Treatment  Patient Details  Name: Denise [name] MRN: [id number] Date of Birth: [birth year] Referring Provider (PT): Dillingham   Encounter Date: 04/29/2019    Past Medical History:  Diagnosis Date  . Abnormal breast biopsy    PASH, [name]  . Breast cancer ([place]) 11/2017   left breast; ER/PR/Her2neu POS  . Family history of breast cancer   . Family history of ovarian cancer   . Fibroadenoma of breast, left 2000, 2016  . Genetic screening 08/26/2013   BRCA/BART negative/Myriad, CHEK2 POS 2017  . Increased risk of breast cancer 2017   IBIS=47%  . Left breast lump 06/18/2017   Fluid/drained [name]  . Migraine   . Monoallelic mutation of CHEK2 gene in female patient 2017   increased risk of breast and colon cancer  . Personal history of chemotherapy   . Personal history of radiation therapy     Past Surgical History:  Procedure Laterality Date  . BREAST BIOPSY Bilateral 09/08/14   neg  . BREAST BIOPSY Left 03/16/2015   Procedure: BREAST BIOPSY WITH NEEDLE LOCALIZATION;  Surgeon: [name], MD;  Location: [facility];  Service: General;  Laterality: Left;  . BREAST BIOPSY Left 11/2017   positive  . BREAST CYST ASPIRATION Left 06/18/2017   cyst aspiration  . BREAST EXCISIONAL BIOPSY Left 09/2014  . BREAST EXCISIONAL BIOPSY Right    age 33's  . BREAST RECONSTRUCTION WITH PLACEMENT OF TISSUE EXPANDER AND FLEX HD (ACELLULAR HYDRATED DERMIS) Left 05/20/2018   Procedure: BREAST RECONSTRUCTION WITH PLACEMENT OF TISSUE EXPANDER AND FLEX HD (ACELLULAR HYDRATED DERMIS);  Surgeon: [name], DO;  Location: [facility];  Service: Plastics;  Laterality: Left;  . BREAST RECONSTRUCTION WITH PLACEMENT OF TISSUE EXPANDER AND FLEX HD (ACELLULAR HYDRATED DERMIS) Right 12/09/2018   Procedure: BREAST  RECONSTRUCTION WITH PLACEMENT OF TISSUE EXPANDER AND FLEX HD (ACELLULAR HYDRATED DERMIS);  Surgeon: [name], DO;  Location: [facility];  Service: Plastics;  Laterality: Right;  . BREAST SURGERY Right March 2000   benign fibroadenoma  . BREAST SURGERY Left 08/08/13   excision  . BREAST SURGERY Left 09/23/14   excision  . ESOPHAGOGASTRODUODENOSCOPY (EGD) WITH PROPOFOL N/A 01/20/2019   Procedure: ESOPHAGOGASTRODUODENOSCOPY (EGD) WITH PROPOFOL;  Surgeon: [name], MD;  Location: [facility];  Service: Gastroenterology;  Laterality: N/A;  . MASTECTOMY Left 05/20/2018  . MASTECTOMY W/ SENTINEL NODE BIOPSY Left 05/20/2018   Procedure: MASTECTOMY WITH SENTINEL LYMPH NODE BIOPSY;  Surgeon: [name], MD;  Location: [facility];  Service: General;  Laterality: Left;  . PORT-A-CATH REMOVAL Right 03/31/2019   Procedure: REMOVAL PORT-A-CATH;  Surgeon: [name], DO;  Location: [facility];  Service: Plastics;  Laterality: Right;  . PORTACATH PLACEMENT Right 12/17/2017   Procedure: INSERTION PORT-A-CATH;  Surgeon: [name], MD;  Location: [facility];  Service: General;  Laterality: Right;  . REMOVAL OF BILATERAL TISSUE EXPANDERS WITH PLACEMENT OF BILATERAL BREAST IMPLANTS Bilateral 03/31/2019   Procedure: REMOVAL OF BILATERAL TISSUE EXPANDERS WITH PLACEMENT OF BILATERAL BREAST IMPLANTS;  Surgeon: [name], DO;  Location: [facility];  Service: Plastics;  Laterality: Bilateral;  2 hours  . SIMPLE MASTECTOMY WITH AXILLARY SENTINEL NODE BIOPSY Right 12/09/2018   Procedure: SIMPLE MASTECTOMY RIGHT;  Surgeon: [name], MD;  Location: [facility];  Service: General;  Laterality: Right;  There were no vitals filed for this visit.    Ther-Ex -Quadruped to child's pose flexion ROM stretch 2x 30sec - Childs pose R lateral bias 2x 30sec hold -bicep curl to overhead press 3x 10 c 5lb good carry over of proper technique - 90/90 ER with GTB 3x 10 with min  cuing to maintain elbow height and scapular retraction  - Y on theraball 3x 10 with min cuing for cervical position with good carry over following - Scaption 2# 3x 10 with good technique  MMT testing                          PT Short Term Goals - 02/20/19 1624      PT SHORT TERM GOAL #1   Title  Pt will be independent with HEP in order to improve strength and motion, and to decrease pain in order to improve pain-free function at home and work.    Baseline  Completes HEP without questions or concerns    Time  4    Period  Weeks    Status  Achieved        PT Long Term Goals - 02/20/19 1625      PT LONG TERM GOAL #1   Title  Pt will decrease worst pain as reported on NPRS by at least 3 points in order to demonstrate clinically significant reduction in pain.    Baseline  02/20/19 2/10    Time  8    Period  Weeks    Status  Achieved      PT LONG TERM GOAL #2   Title  Patient will increase FOTO score to 72 to demonstrate predicted increase in functional mobility to complete ADLs    Baseline  02/20/19    Time  8    Period  Weeks    Status  Achieved      PT LONG TERM GOAL #3   Title  Patient will demonstrate all active, painfree shoulder ROM within normal limits in order to complete ADLs    Baseline  02/20/19 R IR to T12 and full but "tense" with R shoulder abd    Time  8    Period  Weeks    Status  On-going      PT LONG TERM GOAL #4   Title  Pt will increase strength by at least 1/2 MMT grade in order to demonstrate improvement in strength and function    Baseline  02/20/19: R/L Y bilat T 4 bilat  Lats 4- bilat    Time  8    Period  Weeks    Status  New              Patient will benefit from skilled therapeutic intervention in order to improve the following deficits and impairments:     Visit Diagnosis: No diagnosis found.     Problem List Patient Active Problem List   Diagnosis Date Noted  . Iron deficiency anemia due to chronic blood loss  02/14/2019  . Melena   . Epistaxis 01/08/2019  . Acquired absence of right breast 12/17/2018  . Admission for breast reconstruction following mastectomy 12/09/2018  . Use of tamoxifen (Nolvadex) 12/04/2018  . Acquired absence of left breast 09/03/2018  . Visual changes 08/20/2018  . Breast asymmetry following reconstructive surgery 08/13/2018  . Hot flashes due to menopause 06/18/2018  . Status post left mastectomy 06/04/2018  . S/P breast reconstruction, bilateral 05/28/2018  .  Vasomotor symptoms due to menopause 04/17/2018  . Elevated LFTs 03/17/2018  . Goals of care, counseling/discussion 03/17/2018  . Hypomagnesemia 03/04/2018  . Diarrhea 01/02/2018  . Encounter for antineoplastic chemotherapy 12/21/2017  . Encounter for antineoplastic immunotherapy 12/21/2017  . 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 Breast mass, right 12/05/2016  . Monoallelic mutation of CHEK2 gene in female patient 06/20/2015  . Malignant neoplasm of upper-outer quadrant of female breast ([place]) 09/17/2014   Shelton Silvas PT, DPT Shelton Silvas 04/29/2019, 4:37 PM  [facility] [address] [place], [address] Phone: [phone]   Fax:  [phone]  Name: HERTA HINK MRN: [id number] Date of Birth: Jun 03, 1976

## 2019-05-01 ENCOUNTER — Ambulatory Visit: Payer: BC Managed Care – PPO | Admitting: Physical Therapy

## 2019-05-01 ENCOUNTER — Other Ambulatory Visit: Payer: Self-pay

## 2019-05-01 ENCOUNTER — Encounter: Payer: Self-pay | Admitting: Physical Therapy

## 2019-05-01 DIAGNOSIS — M25511 Pain in right shoulder: Secondary | ICD-10-CM

## 2019-05-01 DIAGNOSIS — M25512 Pain in left shoulder: Secondary | ICD-10-CM

## 2019-05-01 NOTE — Therapy (Signed)
[facility] [address] [place], [address] Phone: [phone]   Fax:  [phone]  Physical Therapy Treatment/DC Summary  Patient Details  Name: Denise [name] MRN: [id number] Date of Birth: 03-27-1976 Referring Provider (PT): D[place]ham   Encounter Date: 05/01/2019  PT End of Session - 05/02/19 1005    Visit Number  27    Number of Visits  49    Date for PT Re-Evaluation  05/13/19    PT Start Time  0500    PT Stop Time  0524    PT Time Calculation (min)  24 min    Activity Tolerance  Patient tolerated treatment well;No increased pain    Behavior During Therapy  WFL for tasks assessed/performed       Past Medical History:  Diagnosis Date  . Abnormal breast biopsy    [facility], [name]  . Breast cancer ([place]) 11/2017   left breast; ER/PR/Her2neu POS  . Family history of breast cancer   . Family history of ovarian cancer   . Fibroadenoma of breast, left 2000, 2016  . Genetic screening 08/26/2013   BRCA/BART negative/[facility], CHEK2 POS 2017  . Increased risk of breast cancer 2017   IBIS=47%  . Left breast lump 06/18/2017   Fluid/drained [name]  . Migraine   . Monoallelic mutation of CHEK2 gene in female patient 2017   increased risk of breast and colon cancer  . Personal history of chemotherapy   . Personal history of radiation therapy     Past Surgical History:  Procedure Laterality Date  . BREAST BIOPSY Bilateral 09/08/14   neg  . BREAST BIOPSY Left 03/16/2015   Procedure: BREAST BIOPSY WITH NEEDLE LOCALIZATION;  Surgeon: [name], MD;  Location: [facility];  Service: General;  Laterality: Left;  . BREAST BIOPSY Left 11/2017   positive  . BREAST CYST ASPIRATION Left 06/18/2017   cyst aspiration  . BREAST EXCISIONAL BIOPSY Left 09/2014  . BREAST EXCISIONAL BIOPSY Right    age 26's  . BREAST RECONSTRUCTION WITH PLACEMENT OF TISSUE EXPANDER AND FLEX HD (ACELLULAR HYDRATED DERMIS)  Left 05/20/2018   Procedure: BREAST RECONSTRUCTION WITH PLACEMENT OF TISSUE EXPANDER AND FLEX HD (ACELLULAR HYDRATED DERMIS);  Surgeon: [name], DO;  Location: [facility];  Service: Plastics;  Laterality: Left;  . BREAST RECONSTRUCTION WITH PLACEMENT OF TISSUE EXPANDER AND FLEX HD (ACELLULAR HYDRATED DERMIS) Right 12/09/2018   Procedure: BREAST RECONSTRUCTION WITH PLACEMENT OF TISSUE EXPANDER AND FLEX HD (ACELLULAR HYDRATED DERMIS);  Surgeon: [name], DO;  Location: [facility];  Service: Plastics;  Laterality: Right;  . BREAST SURGERY Right March 2000   benign fibroadenoma  . BREAST SURGERY Left 08/08/13   excision  . BREAST SURGERY Left 09/23/14   excision  . ESOPHAGOGASTRODUODENOSCOPY (EGD) WITH PROPOFOL N/A 01/20/2019   Procedure: ESOPHAGOGASTRODUODENOSCOPY (EGD) WITH PROPOFOL;  Surgeon: [name], MD;  Location: [facility];  Service: Gastroenterology;  Laterality: N/A;  . MASTECTOMY Left 05/20/2018  . MASTECTOMY W/ SENTINEL NODE BIOPSY Left 05/20/2018   Procedure: MASTECTOMY WITH SENTINEL LYMPH NODE BIOPSY;  Surgeon: [name], MD;  Location: [facility];  Service: General;  Laterality: Left;  . PORT-A-CATH REMOVAL Right 03/31/2019   Procedure: REMOVAL PORT-A-CATH;  Surgeon: [name], DO;  Location: [facility];  Service: Plastics;  Laterality: Right;  . PORTACATH PLACEMENT Right 12/17/2017   Procedure: INSERTION PORT-A-CATH;  Surgeon: [name], MD;  Location: [facility];  Service:  General;  Laterality: Right;  . REMOVAL OF BILATERAL TISSUE EXPANDERS WITH PLACEMENT OF BILATERAL BREAST IMPLANTS Bilateral 03/31/2019   Procedure: REMOVAL OF BILATERAL TISSUE EXPANDERS WITH PLACEMENT OF BILATERAL BREAST IMPLANTS;  Surgeon: [name], DO;  Location: [facility];  Service: Plastics;  Laterality: Bilateral;  2 hours  . SIMPLE MASTECTOMY WITH AXILLARY SENTINEL NODE BIOPSY Right 12/09/2018   Procedure: SIMPLE MASTECTOMY RIGHT;  Surgeon: [name], MD;  Location: [facility];  Service: General;  Laterality: Right;    There were no vitals filed for this visit.  Subjective Assessment - 05/02/19 1004    Subjective  Pt feels d/c ready, no ADL limitations    Pertinent History  Patient is a 43 year old female with breast cancer history beginning in 2016 with multiple benign lumpectomies. Patient reports first malignant finding this past June 2019 in L breast, with following masectomy with saline expander placed 05/20/18. Patient reports chemo prior with radiation beginning next week 5days/week, for 6 weeks. Patient reports she is also planning to have R masectomy with saline expansion in the future, unscheduled. 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 Patient reports she is a mother of a 64 year old and 62 year old that she reports are very involved in extra-curriculars. Patient reports her ROM in the shoulder is improving, but that  she is having some pain in the inside elbow and into medial forearm with straightening. Patient reports some TTP at incision site, but most all pain at elbow into forearm. Worst pain in past week 7/10 and best 0/10.  Reports pain is sharp in the medial elbow and forearm; denies numbness or tingling    Limitations  House hold activities;Lifting    How long can you sit comfortably?  unlimited    How long can you stand comfortably?  unlimited    How long can you walk comfortably?  unlimited    Patient Stated Goals  Decrease pain         Ther-Ex Pt able to complete a set of the following with proper technique following demo. Pt educated on how/when to inc/decrease resistance, rep/set range for strength, frequency, etc. Patient verbalizes understanding of all education and complies with d/c readiness  Exercises  Standing Shoulder External Rotation with Resistance - 5-10 reps - 3 sets - 1x daily - 3-4x weekly  Standing Shoulder External Rotation with Resistance at 45 Degrees of  Abduction - 5-10 reps - 3 sets - 1x daily - 3-4x weekly  Standing High Row with Resistance - 5-10 reps - 3 sets - 1x daily - 3-4x weekly  Shoulder extension with resistance - Neutral - 5-10 reps - 3 sets - 1x daily - 3-4x weekly  Standing Bent Over Shoulder Row with Resistance - 5-10 reps - 3 sets - 1x daily - 3-4x weekly  Standing Shoulder Scaption with Resistance - 5-10 reps - 3 sets - 1x daily - 3-4x weekly  Prone Shoulder Horizontal Abduction with Thumbs Up - 5-10 reps - 3 sets - 1x daily - 3-4x weekly  Prone Scapular Retraction Y - 510 reps - 3 sets - 1x daily - 3-4x weekly  Prone Shoulder Extension - 5- 10 reps - 3 sets - 1x daily - 3-4x weekly                         PT Education - 05/02/19 1004    Education Details  d/c recommendations, HEP  Person(s) Educated  Patient    Methods  Explanation;Demonstration;Tactile cues;Verbal cues;Handout    Comprehension  Verbalized understanding;Returned demonstration;Verbal cues required;Tactile cues required       PT Short Term Goals - 02/20/19 1624      PT SHORT TERM GOAL #1   Title  Pt will be independent with HEP in order to improve strength and motion, and to decrease pain in order to improve pain-free function at home and work.    Baseline  Completes HEP without questions or concerns    Time  4    Period  Weeks    Status  Achieved        PT Long Term Goals - 04/29/19 1651      PT LONG TERM GOAL #1   Title  Pt will decrease worst pain as reported on NPRS by at least 3 points in order to demonstrate clinically significant reduction in pain.    Baseline  04/28/09 1/10    Time  8    Period  Weeks    Status  On-going      PT LONG TERM GOAL #2   Title  Patient will increase FOTO score to 72 to demonstrate predicted increase in functional mobility to complete ADLs    Baseline  04/29/19 83    Time  8    Period  Weeks    Status  Achieved      PT LONG TERM GOAL #3   Title  Patient will demonstrate all  active, painfree shoulder ROM within normal limits in order to complete ADLs    Baseline  04/29/19 Full ROM    Time  8    Period  Weeks    Status  Achieved      PT LONG TERM GOAL #4   Title  Pt will increase strength by at least 1/2 MMT grade in order to demonstrate improvement in strength and function    Baseline  02/20/19: R/L Y L: 4/5 R 4+/5 ; T 4+ bilat  Lats 5/5 bilat    Time  8    Period  Weeks    Status  On-going            Plan - 05/02/19 1012    Clinical Impression Statement  PT reviewed robust HEP with patient to ensure maintainence of strength gains. Patient demonstrates and verbalizes understanding of all therex. Patient given PT clinic contact info should any further questions/concerns arise. Pt to d/c PT.    Stability/Clinical Decision Making  Evolving/Moderate complexity    Clinical Decision Making  Moderate    Rehab Potential  Good    Clinical Impairments Affecting Rehab Potential  (+) age, motivation, social support, (-) current sedentary lifestyle, ongoing cancer treatment, other comorbidities    PT Frequency  2x / week    PT Duration  8 weeks    PT Treatment/Interventions  ADLs/Self Care Home Management;Aquatic Therapy;Electrical Stimulation;Cryotherapy;Moist Heat;Iontophoresis 39m/ml Dexamethasone;Functional mobility training;Therapeutic activities;Therapeutic exercise;Traction;Ultrasound;Neuromuscular re-education;Manual techniques;Patient/family education;Manual lymph drainage;Passive range of motion;Dry needling;Energy conservation;Taping    PT Next Visit Plan  Ease soft tissue restrictions, increase ROM as able    PT Home Exercise Plan  pulleys shoulder abd/flex, sidelying lat stretch, supine ER stretch, scar massage    Consulted and Agree with Plan of Care  Patient       Patient will benefit from skilled therapeutic intervention in order to improve the following deficits and impairments:  Decreased activity tolerance, Decreased endurance, Decreased range of  motion, Decreased skin integrity,  Hypomobility, Increased fascial restricitons, Impaired UE functional use, Improper body mechanics, Pain, Postural dysfunction, Impaired flexibility, Decreased scar mobility  Visit Diagnosis: Acute pain of right shoulder  Acute pain of left shoulder     Problem List Patient Active Problem List   Diagnosis Date Noted  . Iron deficiency anemia due to chronic blood loss 02/14/2019  . Melena   . Epistaxis 01/08/2019  . Acquired absence of right breast 12/17/2018  . Admission for breast reconstruction following mastectomy 12/09/2018  . Use of tamoxifen (Nolvadex) 12/04/2018  . Acquired absence of left breast 09/03/2018  . Visual changes 08/20/2018  . Breast asymmetry following reconstructive surgery 08/13/2018  . Hot flashes due to menopause 06/18/2018  . Status post left mastectomy 06/04/2018  . S/P breast reconstruction, bilateral 05/28/2018  . Vasomotor symptoms due to menopause 04/17/2018  . Elevated LFTs 03/17/2018  . Goals of care, counseling/discussion 03/17/2018  . Hypomagnesemia 03/04/2018  . Diarrhea 01/02/2018  . Encounter for antineoplastic chemotherapy 12/21/2017  . Encounter for antineoplastic immunotherapy 12/21/2017  . 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 Breast mass, right 12/05/2016  . Monoallelic mutation of CHEK2 gene in female patient 06/20/2015  . Malignant neoplasm of upper-outer quadrant of female breast ([place]) 09/17/2014   Shelton Silvas PT, DPT Shelton Silvas 05/02/2019, 10:13 AM  [facility] [address] [place], [address] Phone: [phone]   Fax:  [phone]  Name: Denise [name] MRN: [id number] Date of Birth: 1976/02/07

## 2019-05-02 ENCOUNTER — Encounter: Payer: Self-pay | Admitting: Physical Therapy

## 2019-05-09 ENCOUNTER — Inpatient Hospital Stay: Payer: BC Managed Care – PPO | Attending: Hematology and Oncology

## 2019-05-09 ENCOUNTER — Other Ambulatory Visit: Payer: Self-pay

## 2019-05-09 DIAGNOSIS — Z79899 Other long term (current) drug therapy: Secondary | ICD-10-CM | POA: Insufficient documentation

## 2019-05-09 DIAGNOSIS — Z800 Family history of malignant neoplasm of digestive organs: Secondary | ICD-10-CM | POA: Diagnosis not present

## 2019-05-09 DIAGNOSIS — Z79810 Long term (current) use of selective estrogen receptor modulators (SERMs): Secondary | ICD-10-CM | POA: Diagnosis not present

## 2019-05-09 DIAGNOSIS — D500 Iron deficiency anemia secondary to blood loss (chronic): Secondary | ICD-10-CM

## 2019-05-09 DIAGNOSIS — D509 Iron deficiency anemia, unspecified: Secondary | ICD-10-CM | POA: Diagnosis not present

## 2019-05-09 DIAGNOSIS — C50419 Malignant neoplasm of upper-outer quadrant of unspecified female breast: Secondary | ICD-10-CM

## 2019-05-09 DIAGNOSIS — C50412 Malignant neoplasm of upper-outer quadrant of left female breast: Secondary | ICD-10-CM | POA: Insufficient documentation

## 2019-05-09 DIAGNOSIS — C773 Secondary and unspecified malignant neoplasm of axilla and upper limb lymph nodes: Secondary | ICD-10-CM | POA: Insufficient documentation

## 2019-05-09 DIAGNOSIS — Z8041 Family history of malignant neoplasm of ovary: Secondary | ICD-10-CM | POA: Insufficient documentation

## 2019-05-09 DIAGNOSIS — Z9013 Acquired absence of bilateral breasts and nipples: Secondary | ICD-10-CM | POA: Diagnosis not present

## 2019-05-09 DIAGNOSIS — Z170 Estrogen receptor positive status [ER+]: Secondary | ICD-10-CM | POA: Diagnosis not present

## 2019-05-09 DIAGNOSIS — Z8042 Family history of malignant neoplasm of prostate: Secondary | ICD-10-CM | POA: Diagnosis not present

## 2019-05-09 DIAGNOSIS — Z803 Family history of malignant neoplasm of breast: Secondary | ICD-10-CM | POA: Insufficient documentation

## 2019-05-09 LAB — COMPREHENSIVE METABOLIC PANEL
ALT: 16 U/L (ref 0–44)
AST: 19 U/L (ref 15–41)
Albumin: 4.1 g/dL (ref 3.5–5.0)
Alkaline Phosphatase: 61 U/L (ref 38–126)
Anion gap: 9 (ref 5–15)
BUN: 12 mg/dL (ref 6–20)
CO2: 25 mmol/L (ref 22–32)
Calcium: 9.2 mg/dL (ref 8.9–10.3)
Chloride: 103 mmol/L (ref 98–111)
Creatinine, Ser: 0.79 mg/dL (ref 0.44–1.00)
GFR calc Af Amer: >60 mL/min (ref >60)
GFR calc non Af Amer: >60 mL/min (ref >60)
Glucose, Bld: 104 mg/dL — ABNORMAL HIGH (ref 70–99)
Potassium: 4.4 mmol/L (ref 3.5–5.1)
Sodium: 137 mmol/L (ref 135–145)
Total Bilirubin: 0.6 mg/dL (ref 0.3–1.2)
Total Protein: 7.6 g/dL (ref 6.5–8.1)

## 2019-05-09 LAB — CBC WITH DIFFERENTIAL/PLATELET
Abs Immature Granulocytes: 0.02 10*3/uL (ref 0.00–0.07)
Basophils Absolute: 0 10*3/uL (ref 0.0–0.1)
Basophils Relative: 0 %
Eosinophils Absolute: 0.1 10*3/uL (ref 0.0–0.5)
Eosinophils Relative: 2 %
HCT: 38.8 % (ref 36.0–46.0)
Hemoglobin: 12.7 g/dL (ref 12.0–15.0)
Immature Granulocytes: 0 %
Lymphocytes Relative: 26 %
Lymphs Abs: 1.7 10*3/uL (ref 0.7–4.0)
MCH: 26.8 pg (ref 26.0–34.0)
MCHC: 32.7 g/dL (ref 30.0–36.0)
MCV: 81.9 fL (ref 80.0–100.0)
Monocytes Absolute: 0.5 10*3/uL (ref 0.1–1.0)
Monocytes Relative: 7 %
Neutro Abs: 4 10*3/uL (ref 1.7–7.7)
Neutrophils Relative %: 65 %
Platelets: 231 10*3/uL (ref 150–400)
RBC: 4.74 MIL/uL (ref 3.87–5.11)
RDW: 14.6 % (ref 11.5–15.5)
WBC: 6.3 10*3/uL (ref 4.0–10.5)
nRBC: 0 % (ref 0.0–0.2)

## 2019-05-09 LAB — FERRITIN: Ferritin: 32 ng/mL (ref 11–307)

## 2019-05-10 LAB — CANCER ANTIGEN 27.29: CA 27.29: 21.9 U/mL (ref 0.0–38.6)

## 2019-05-11 NOTE — Progress Notes (Signed)
[facility]  [address] [place],  [address] Phone: [phone]  Fax: [phone]   Telemedicine Office Visit:  05/12/2019  Referring physician: [name],*  I connected with Denise [name] on 05/12/2019 at 3:01 PM by videoconferencing and verified that I was speaking with the correct person using 2 identifiers.  The patient was at home.  I discussed the limitations, risk, security and privacy concerns of performing an evaluation and management service by videoconferencing and the availability of in person appointments.  I also discussed with the patient that there may be a patient responsible charge related to this service.  The patient expressed understanding and agreed to proceed.   Chief Complaint: Denise [name] is a 43 y.o. female with multi-focal Her2/neu + left breast cancerwho is seen for 3 monthassessment.  HPI: The patient was last seen in the medical oncology clinic on 02/14/2019. At that time, she was doing well.  Energy level had improved.  Ear congestion s/p hemotympanum had resolved.  She remaind on oral iron.  Exam was unrermakable. Hematocrit 34.6. Hemoglobin 11.2. MCV 85.9. Ferritin was 21 (goal 100). Patient received Kanjinti. She continued tamoxifen and oral iron.  The patient had a breast reconstruction follow up with Roetta Sessions, PA-C on 02/18/2019.  She was doing well. There was no sign of infection, seroma, or hematoma. The right breast had 450 cc and the left had 480 cc. The skin over the left expander was tight and she may not be able to have much more expansion. She was tolerating expansion well. He recommended that patient take some valium for skin tightness.   She had a follow up with Roetta Sessions, PA-C on 03/04/2019. She was doing well. She was happy with her current breast size. There was no sign of infection. She had no fevers, sweats, or chills. She tolerated last fill with no issues.    Patient was seen by [name] on 03/14/2019 for pre-op exam and removal of (B) breast expanders and placement of silicone implants. She had bilateral expanders in place and a little bit of asymmetry. [name] needed to lower the left expander. The right expander needed to go slightly medially in position. There was no sign of infection or seroma. Patient was pleased with results.   She underwent bilateral exchange of tissue expanders for implants and bilateral capsulotomies by Dr [name] on 03/31/2019.  The patient had a follow up with Dr. Baruch Gouty on 04/07/2019. She was doing well. She denied chest wall tenderness, cough, bone pain, or any left upper extremity edema. Patient was advised to call office if she had any concerns.   She was seen by [name] on 04/08/2019. She noted mild edema. Her left breast was more swollen that the right. She had no infections and the incisions were healing well. She was directed to wear a sports bra. She could increase daily activity but no heavy lifting. She will follow up in 1 month.   Labs on 05/09/2019 revealed hematocrit 38.8, hemoglobin 12.7, platelets 231,000, WBC 6,300. Ferritin was 32. CA 27.29 was 21.9.   During the interim, she has done well. She is no longer taking oral iron. Her bowels are normal. She is on tamoxifen. She will see [name] on 05/13/2019. She just finished with physical therapy; her range of motion has improved. She denies any swelling. She is not doing any heavy lifting. She is back to performing normal activity. Port-a-cath was removed on 03/31/2019.  Patient  would like to  lose weight. I advised her to lose weight slow and steady. I encouraged the patient to call the clinic if she has any concerns.     Past Medical History:  Diagnosis Date   Abnormal breast biopsy    [facility], [name]   Breast cancer [facility]) 11/2017   left breast; ER/PR/Her2neu POS   Family history of breast cancer    Family history  of ovarian cancer    Fibroadenoma of breast, left 2000, 2016   Genetic screening 08/26/2013   BRCA/BART negative/Myriad, CHEK2 POS 2017   Increased risk of breast cancer 2017   IBIS=47%   Left breast lump 06/18/2017   Fluid/drained [name]   Migraine    Monoallelic mutation of CHEK2 gene in female patient 2017   increased risk of breast and colon cancer   Personal history of chemotherapy    Personal history of radiation therapy     Past Surgical History:  Procedure Laterality Date   BREAST BIOPSY Bilateral 09/08/14   neg   BREAST BIOPSY Left 03/16/2015   Procedure: BREAST BIOPSY WITH NEEDLE LOCALIZATION;  Surgeon: [name], MD;  Location: [facility];  Service: General;  Laterality: Left;   BREAST BIOPSY Left 11/2017   positive   BREAST CYST ASPIRATION Left 06/18/2017   cyst aspiration   BREAST EXCISIONAL BIOPSY Left 09/2014   BREAST EXCISIONAL BIOPSY Right    age 40's   BREAST RECONSTRUCTION WITH PLACEMENT OF TISSUE EXPANDER AND FLEX HD (ACELLULAR HYDRATED DERMIS) Left 05/20/2018   Procedure: BREAST RECONSTRUCTION WITH PLACEMENT OF TISSUE EXPANDER AND FLEX HD (ACELLULAR HYDRATED DERMIS);  Surgeon: [name], DO;  Location: [facility];  Service: Plastics;  Laterality: Left;   BREAST RECONSTRUCTION WITH PLACEMENT OF TISSUE EXPANDER AND FLEX HD (ACELLULAR HYDRATED DERMIS) Right 12/09/2018   Procedure: BREAST RECONSTRUCTION WITH PLACEMENT OF TISSUE EXPANDER AND FLEX HD (ACELLULAR HYDRATED DERMIS);  Surgeon: [name], DO;  Location: [facility];  Service: Plastics;  Laterality: Right;   BREAST SURGERY Right March 2000   benign fibroadenoma   BREAST SURGERY Left 08/08/13   excision   BREAST SURGERY Left 09/23/14   excision   ESOPHAGOGASTRODUODENOSCOPY (EGD) WITH PROPOFOL N/A 01/20/2019   Procedure: ESOPHAGOGASTRODUODENOSCOPY (EGD) WITH PROPOFOL;  Surgeon: [name], MD;  Location: [place];  Service: Gastroenterology;  Laterality:  N/A;   MASTECTOMY Left 05/20/2018   MASTECTOMY W/ SENTINEL NODE BIOPSY Left 05/20/2018   Procedure: MASTECTOMY WITH SENTINEL LYMPH NODE BIOPSY;  Surgeon: [name], MD;  Location: [facility];  Service: General;  Laterality: Left;   PORT-A-CATH REMOVAL Right 03/31/2019   Procedure: REMOVAL PORT-A-CATH;  Surgeon: [name], DO;  Location: [facility];  Service: Plastics;  Laterality: Right;   PORTACATH PLACEMENT Right 12/17/2017   Procedure: INSERTION PORT-A-CATH;  Surgeon: [name], MD;  Location: [facility];  Service: General;  Laterality: Right;   REMOVAL OF BILATERAL TISSUE EXPANDERS WITH PLACEMENT OF BILATERAL BREAST IMPLANTS Bilateral 03/31/2019   Procedure: REMOVAL OF BILATERAL TISSUE EXPANDERS WITH PLACEMENT OF BILATERAL BREAST IMPLANTS;  Surgeon: [name], DO;  Location: [facility];  Service: Plastics;  Laterality: Bilateral;  2 hours   SIMPLE MASTECTOMY WITH AXILLARY SENTINEL NODE BIOPSY Right 12/09/2018   Procedure: SIMPLE MASTECTOMY RIGHT;  Surgeon: [name], MD;  Location: [facility];  Service: General;  Laterality: Right;    Family History  Problem Relation Age of Onset   Prostate cancer Father 72  requiring tx/hormones   Breast cancer Paternal Grandmother 12   Breast cancer Maternal Aunt 60   Ovarian cancer Maternal Aunt 70   Breast cancer Paternal Aunt 60   Pancreatic cancer Maternal Aunt 75    Social History:  reports that she has never smoked. She has never used smokeless tobacco. She reports current alcohol use. She reports that she does not use drugs. She does not smoke. She "occasionally" drinks alcohol. 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 During COVID-19 she is working remotely at home. Patient denies known exposures to radiation on toxins. Her husband's name is [name]. She has 2 daughters, [name] and [name] (ages 60 1/2 and 94). Her husband's name is [name]. The patient is alone  today.  Participants in the patient's visit and their role in the encounter included the patient and Orlene Och, Therapist, sports, today.  The intake visit was provided by Orlene Och, RN.  Allergies:  Allergies  Allergen Reactions   Steri-Strip Compound Benzoin [Benzoin Compound]     Steri-strip ,    Tape Other (See Comments)    Minor skin irritation     Current Medications: Current Outpatient Medications  Medication Sig Dispense Refill   acetaminophen (TYLENOL) 500 MG tablet Take 1,000 mg by mouth every 6 (six) hours as needed for moderate pain or headache.      metoprolol succinate (TOPROL-XL) 25 MG 24 hr tablet Take 1 tablet (25 mg total) by mouth daily. (Patient taking differently: Take 25 mg by mouth every morning. ) 30 tablet 0   omeprazole (PRILOSEC OTC) 20 MG tablet Take 20 mg by mouth daily as needed (for acid reflux).     tamoxifen (NOLVADEX) 20 MG tablet Take 1 tablet by mouth once daily 90 tablet 0   diazepam (VALIUM) 2 MG tablet Take 1 tablet (2 mg total) by mouth every 6 (six) hours as needed for anxiety. (Patient not taking: Reported on 03/20/2019) 30 tablet 0   docusate sodium (COLACE) 100 MG capsule Take 100 mg by mouth daily.     Ferrous Sulfate 140 (45 Fe) MG TBCR Take 45 mg by mouth daily.     glucose blood (PRECISION QID TEST) test strip Use 1 each (1 strip total) once daily Use as instructed.     HYDROcodone-acetaminophen (NORCO) 5-325 MG tablet Take 1 tablet by mouth every 8 (eight) hours as needed for moderate pain. (Patient not taking: Reported on 05/12/2019) 15 tablet 0   Lancets Misc. (UNISTIK 2 NORMAL) MISC Use 1 each once daily Use as instructed.     vitamin C (ASCORBIC ACID) 500 MG tablet Take 500 mg by mouth daily.     No current facility-administered medications for this visit.    Facility-Administered Medications Ordered in Other Visits  Medication Dose Route Frequency Provider Last Rate Last Dose   sodium chloride flush (NS) 0.9 % injection  10 mL  10 mL Intravenous PRN Nolon Stalls C, MD   10 mL at 04/17/18 0844     Review of Systems  Constitutional: Negative for chills, diaphoresis, fever, malaise/fatigue and weight loss (flucuates 182-186 pounds).       Feels "good".  HENT: Negative.  Negative for congestion, ear pain, hearing loss, nosebleeds, sinus pain, sore throat and tinnitus.   Eyes: Negative.  Negative for blurred vision, double vision, photophobia, pain, discharge and redness.  Respiratory: Negative.  Negative for cough, hemoptysis, sputum production and shortness of breath.   Cardiovascular: Negative.  Negative for chest pain, palpitations,  orthopnea, leg swelling and PND.  Gastrointestinal: Negative.  Negative for abdominal pain, blood in stool, constipation, diarrhea, heartburn, melena, nausea and vomiting.       Normal bowels.  Genitourinary: Negative.  Negative for frequency, hematuria and urgency.  Musculoskeletal: Negative.  Negative for back pain, falls, joint pain, myalgias and neck pain.  Skin: Negative.  Negative for itching and rash.  Neurological: Negative.  Negative for dizziness, tremors, sensory change, speech change, focal weakness, weakness and headaches.  Endo/Heme/Allergies: Negative.  Does not bruise/bleed easily.       Diabetes.  Psychiatric/Behavioral: Negative.  Negative for depression and memory loss. The patient is not nervous/anxious and does not have insomnia.   All other systems reviewed and are negative.   Performance status (ECOG): 0  Physical Exam  Constitutional: She is oriented to person, place, and time. She appears well-developed and well-nourished. No distress.  HENT:  Head: Normocephalic and atraumatic.  Short styled brown hair.  Eyes: Conjunctivae and EOM are normal. No scleral icterus.  Glasses.  Neurological: She is alert and oriented to person, place, and time.  Skin: She is not diaphoretic.  Psychiatric: She has a normal mood and affect. Her behavior is normal.  Judgment and thought content normal.  Nursing note reviewed.   No visits with results within 3 Day(s) from this visit.  Latest known visit with results is:  Appointment on 05/09/2019  Component Date Value Ref Range Status   Sodium 05/09/2019 137  135 - 145 mmol/L Final   Potassium 05/09/2019 4.4  3.5 - 5.1 mmol/L Final   Chloride 05/09/2019 103  98 - 111 mmol/L Final   CO2 05/09/2019 25  22 - 32 mmol/L Final   Glucose, Bld 05/09/2019 104* 70 - 99 mg/dL Final   BUN 05/09/2019 12  6 - 20 mg/dL Final   Creatinine, Ser 05/09/2019 0.79  0.44 - 1.00 mg/dL Final   Calcium 05/09/2019 9.2  8.9 - 10.3 mg/dL Final   Total Protein 05/09/2019 7.6  6.5 - 8.1 g/dL Final   Albumin 05/09/2019 4.1  3.5 - 5.0 g/dL Final   AST 05/09/2019 19  15 - 41 U/L Final   ALT 05/09/2019 16  0 - 44 U/L Final   Alkaline Phosphatase 05/09/2019 61  38 - 126 U/L Final   Total Bilirubin 05/09/2019 0.6  0.3 - 1.2 mg/dL Final   GFR calc non Af Amer 05/09/2019 >60  >60 mL/min Final   GFR calc Af Amer 05/09/2019 >60  >60 mL/min Final   Anion gap 05/09/2019 9  5 - 15 Final   Performed at [facility], [address], [place], [place] [address]   WBC 05/09/2019 6.3  4.0 - 10.5 K/uL Final   RBC 05/09/2019 4.74  3.87 - 5.11 MIL/uL Final   Hemoglobin 05/09/2019 12.7  12.0 - 15.0 g/dL Final   HCT 05/09/2019 38.8  36.0 - 46.0 % Final   MCV 05/09/2019 81.9  80.0 - 100.0 fL Final   MCH 05/09/2019 26.8  26.0 - 34.0 pg Final   MCHC 05/09/2019 32.7  30.0 - 36.0 g/dL Final   RDW 05/09/2019 14.6  11.5 - 15.5 % Final   Platelets 05/09/2019 231  150 - 400 K/uL Final   nRBC 05/09/2019 0.0  0.0 - 0.2 % Final   Neutrophils Relative % 05/09/2019 65  % Final   Neutro Abs 05/09/2019 4.0  1.7 - 7.7 K/uL Final   Lymphocytes Relative 05/09/2019 26  % Final   Lymphs  Abs 05/09/2019 1.7  0.7 - 4.0 K/uL Final   Monocytes Relative 05/09/2019 7  % Final   Monocytes Absolute 05/09/2019 0.5  0.1 - 1.0  K/uL Final   Eosinophils Relative 05/09/2019 2  % Final   Eosinophils Absolute 05/09/2019 0.1  0.0 - 0.5 K/uL Final   Basophils Relative 05/09/2019 0  % Final   Basophils Absolute 05/09/2019 0.0  0.0 - 0.1 K/uL Final   Immature Granulocytes 05/09/2019 0  % Final   Abs Immature Granulocytes 05/09/2019 0.02  0.00 - 0.07 K/uL Final   Performed at [facility], [address], [place], [place] [address]   CA 27.29 05/09/2019 21.9  0.0 - 38.6 U/mL Final   Comment: (NOTE) Siemens Centaur Immunochemiluminometric Methodology [facility]) Values obtained with different assay methods or kits cannot be used interchangeably. Results cannot be interpreted as absolute evidence of the presence or absence of malignant disease. Performed At: [facility] [place], [place] [id number] Rush Farmer MD RW:[id number]    Ferritin 05/09/2019 32  11 - 307 ng/mL Final   Performed at [facility], [address] [place], [place] [address]    Assessment:  Denise [name] is a 42 y.o. female with multi-focal stage IB Her2/neu + left breast cancers/p neoadjuvant chemotherapy followed by mastectomywith sentinel lymph node biopsy on 05/20/2018. Pathologyrevealed no residual carcinoma in the breast. One of three sentinel lymph nodes were positive. One lymph node had 2 metastatic foci (2.1 mm and 1 mm). Pathologic stagerevealed ypT0 ypN1a.  Index breast mass and axillary nodebiopsyon 12/06/2017 revealed grade II invasive ductal carcinoma with calcifications at the 11 o'clock position. There was metastatic carcinoma in 1 of 1 lymph nodes. Tumor was ER + (100%), PR + (100%), and Ki67 5%. Her2/neu was 3+ by IHC (heterogeneous) and FISH +.  Diagnostic left mammogram and ultrasoundon 12/06/2017 revealed a 1.7 x 1.3 x 1.3 cm irregular hypoechoic mass with internal calcifications at the 11 o'clock position 2 cm from the nipple with internal calcifications. There  was a similar appearing 0.9 x 0.8 x 0.7 cm mass at the 11 o'clock position 4 cm from the nipple (2.4 cm from the index lesion). There were suspicious, segmental calcifications originating from the index lesion and extending 5 cm posteriorly. There was a 0.6 cm morphologically abnormal left axillary lymph node.  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 The adjacent and more superiorly located 11 o'clock mass (7 x 12 mm) seen on recent ultrasound, 4 cm from the nipple on ultrasound, was also identified. There were 2 probable satellite lesions (7 mm, medial lesion; posterior and lateral lesion). The total span of malignancywas 3 x 3.1 x 4.5 cm. There were calcificationsextending up to 5 cm posterior to the biopsied malignancy which were highly suspicious on mammography but not appreciated. There was a mass in the lower outer left breast measured 5 mm, representing a change. The known metastatic nodein the left axilla was identified. A node along the posterolateral margin of the pectoralis minor (cortex 5 mm) was nonspecific but at least somewhat suspicious.  Chest, abdomen, and pelvic CTon 12/19/2017 revealed a solitary enlarged biopsy-proven metastatic left axillary lymph node. There were no additional findings suspicious for metastatic disease in the chest, abdomen or pelvis. There was a nonspecific tiny sclerotic upper right sacral lesion, more likely a benign bone island. Bone scintigraphy correlation  versus follow-up CT could be considered.  Bone scanon 01/11/2018 was negative for metastatic pattern. No areas of focal tracer uptake. Area of concern in sacrum on CT likely represents benign bone island.   Myriad genetic testingon 08/26/2013 was negative for BRCA1 and 2. CHEK2 mutationpositive (increased risk of breast and colon cancer). She has a family history  significant for breast, ovarian, pancreatic, and prostate cancer.  She received 6 cycles of TCHP chemotherapy (12/21/2017 - 04/17/2018) with Margarette Canada support. Cycle #3 was held due to elevated LFTs on 02/04/2018. She received Herceptin + Perjeta alone (04/30/2018 - 05/30/2018). Shereceived8cycles of Kadcyla(06/20/2018 - 07/11/2018; 08/20/2018- 12/25/2018). Cycle #3 was held on 08/01/2018 due to visual changes (blurred vision).Cycle #8 was complicated by epistaxis requiring admission.  She received Kanjinti on 01/24/2019 (last 02/14/2019).  She received breast radiationfrom 06/26/2018 - 08/05/2018.Sheunderwentright prophylactic mastectomywithreconstructionon 12/09/2018. Pathologyon 12/10/2018 revealed benign breast tissue. Sentinellymphnode was negative.  She underwent bilateral exchange of tissue expanders for implants and bilateral capsulotomies on 03/31/2019.  CA27.29 has been followed: 24.0 on 12/12/2017 and 21.9 on 05/09/2019.  Echocardiograms: EF 60-65% on 12/20/2017, 60-65% on 03/27/2018, 55-60% on 06/18/2018, 55-60% on 09/27/2018, and 60-65% on 12/25/2018.  She has a history of elevated LFTs. Hepatitis B and C serologies were negative on 02/04/2018.RUQ ultrasound on 02/06/2018 revealed a small anterior gallbladder wall polyp. There was no evidence of cholelithiasis or cholecystis. CBD measured normal at 2 mm, with no evidence of choledocholithiasis. There was normal direction of blood flow towards the liver noted.  She has vasomotor symptoms. She discontinued Effexor around 09/18/2018. She was amenorrheic from 11/2017 - 10/15/2018. Anthony and estradiol confirmed a premenopausalstate on 10/01/2018. Mensesrestarted on 10/15/2018.  She was admitted to [facility] from 01/08/2019 - 01/09/2019 for persistent epistaxis.HeadCTrevealed minimal mucosal thickening of the right sphenoid sinus and was otherwise normal. She underwent right nare airpacking. She is on  doxycycline 151m x10 days.Etiology wasfelt secondary tohigh blood pressure. She was discharged on metoprolol.  Symptomatically, she feels good.  She denies any breast concerns.  Plan: 1.   Review labs from 05/09/2019.  2.Stage IB multifocal LEFT breast cancer Clinically, She continues to do well. She iss/pprophylactic mastectomy with reconstruction on 12/09/2018. She underwent implant exchange on 03/31/2019. Sheis s/p 52weeks of HER-2/neu directed therapy (last 02/14/2019). Continue tamoxifen. 3.Iron deficiency anemia Hematocrit 25.8. Hemoglobin 8.4. MCV 86.9 on 01/17/2019.             Hematocrit 34.6.  Hemoglobin 11.2.  MCV 85.9 on 02/14/2019.  Hematocrit 38.8.  Hemoglobin 12.7.  MCV 81.9 on 05/09/2019.  Ferritin was 21 on 02/14/2019 and 32 on 05/09/2019.             Patient was on oral iron x 2 months.  4.RTC in 3 months for MD assessment and labs (CBC with diff, CMP, CA27.29).  I discussed the assessment and treatment plan with the patient.  The patient was provided an opportunity to ask questions and all were answered.  The patient agreed with the plan and demonstrated an understanding of the instructions.  The patient was advised to call back or seek an in person evaluation if the symptoms worsen or if the condition fails to improve as anticipated.   MNolon Stalls MD, PhD  05/12/2019, 3:01 PM  I, ASelena Batten am acting as scribe for [employer] CMike Gip MD, PhD.  I, Oather Muilenburg C. CMike Gip MD, have reviewed the above documentation for accuracy and completeness, and I agree with the above.

## 2019-05-12 ENCOUNTER — Inpatient Hospital Stay: Payer: BC Managed Care – PPO

## 2019-05-12 ENCOUNTER — Inpatient Hospital Stay (HOSPITAL_BASED_OUTPATIENT_CLINIC_OR_DEPARTMENT_OTHER): Payer: BC Managed Care – PPO | Admitting: Hematology and Oncology

## 2019-05-12 ENCOUNTER — Other Ambulatory Visit: Payer: Self-pay

## 2019-05-12 ENCOUNTER — Telehealth: Payer: Self-pay

## 2019-05-12 DIAGNOSIS — Z170 Estrogen receptor positive status [ER+]: Secondary | ICD-10-CM | POA: Diagnosis not present

## 2019-05-12 DIAGNOSIS — Z7189 Other specified counseling: Secondary | ICD-10-CM

## 2019-05-12 DIAGNOSIS — K219 Gastro-esophageal reflux disease without esophagitis: Secondary | ICD-10-CM

## 2019-05-12 DIAGNOSIS — Z79899 Other long term (current) drug therapy: Secondary | ICD-10-CM

## 2019-05-12 DIAGNOSIS — I10 Essential (primary) hypertension: Secondary | ICD-10-CM

## 2019-05-12 DIAGNOSIS — C50419 Malignant neoplasm of upper-outer quadrant of unspecified female breast: Secondary | ICD-10-CM

## 2019-05-12 DIAGNOSIS — Z79811 Long term (current) use of aromatase inhibitors: Secondary | ICD-10-CM

## 2019-05-12 DIAGNOSIS — R600 Localized edema: Secondary | ICD-10-CM

## 2019-05-12 DIAGNOSIS — D500 Iron deficiency anemia secondary to blood loss (chronic): Secondary | ICD-10-CM | POA: Diagnosis not present

## 2019-05-12 NOTE — Telephone Encounter (Signed)
Spoke with patient on the phone about SCP visit.  Patient in agreement for me to mail her the [facility] packet and asked I call her after 4:30 pm on Dec. 7th to review information and discuss post cancer care.   Packet mailed.

## 2019-05-12 NOTE — Progress Notes (Signed)
Verified patients name and DOB for virtual visit today.  Patient states that she is doing well. She had exchange surgery on 03/31/19. She has had some tingling in her left upper left arm since her surgery and she discussed this with her physical therapist who told her that its probably from her surgery/nerves growing back. She states that it is getting better.

## 2019-05-13 ENCOUNTER — Ambulatory Visit (INDEPENDENT_AMBULATORY_CARE_PROVIDER_SITE_OTHER): Payer: BC Managed Care – PPO | Admitting: Plastic Surgery

## 2019-05-13 ENCOUNTER — Encounter: Payer: Self-pay | Admitting: Plastic Surgery

## 2019-05-13 VITALS — BP 130/79 | HR 74 | Temp 97.8°F | Wt 188.0 lb

## 2019-05-13 DIAGNOSIS — Z9889 Other specified postprocedural states: Secondary | ICD-10-CM

## 2019-05-13 NOTE — Progress Notes (Signed)
The patient is a 42 year old female here for follow-up on her bilateral breast surgery.  She underwent exchange of her tissue expanders to implants on October 12.  Her right breast mastectomy was done in June 2020.  Her left breast mastectomy was done in December 2019.  The left breast is a little firmer right now compared to the right.  The skin is healing very nicely.  There is no sign of seroma or hematoma.  She would like to have nipple areola reconstruction with tattoo placement.  I think this would be a great option for her.  She is wearing a sports bra she should continue with the sports bra and can start massaging. I will submit for nipple areola tattoo placement for January. Pictures were obtained of the patient and placed in the chart with the patient's or guardian's permission.

## 2019-05-19 ENCOUNTER — Encounter: Payer: Self-pay | Admitting: Hematology and Oncology

## 2019-05-19 ENCOUNTER — Telehealth: Payer: Self-pay

## 2019-05-19 ENCOUNTER — Encounter: Payer: Self-pay | Admitting: Plastic Surgery

## 2019-05-19 MED ORDER — TRIAMCINOLONE ACETONIDE 0.025 % EX OINT: 1.0000 "application " | TOPICAL_OINTMENT | Freq: Two times a day (BID) | CUTANEOUS | 0 refills | Status: DC

## 2019-05-19 NOTE — Telephone Encounter (Signed)
Spoke with the patient to see to see if she has tried anything new like new soap, washing powder or lotion.The patient sent a message to inform the office that she has some new red spots noted to her torso ne itching or irritation reported today. The patient states she spoke with her plastic surgery and she ordered her some steroid cream and she wanted to try that first before coming into the office. I have informed the patient if things doesn't get better she need to contact the office by Thursday or Friday so we can get her seen in [facility]. The patient was understanding and agreeable.

## 2019-05-19 NOTE — Telephone Encounter (Signed)
Call to pt re: c/o rash She reports that she continues to have a "rash"/red areas over her entire torso/abdomen- more noted under left breast She denies any itching & states that there is no drainage, She reports that incision is intact Pt did just receive refills of Lobetalol  & Tamoxifen- but cannot identify any changes in these meds She has used OTC hydrocortisone cream & has had no relief. I discussed the use of OTC non-drowsy allergy relief meds (ie) Claritan & she could try OTC Benadryl at night if needed I informed her that I will forward her c/o to [name] &/or Catalina Antigua, PA Pt agrees with plan of care [place]

## 2019-05-19 NOTE — Telephone Encounter (Signed)
Steroid cream rx for abdominal rash. Avoid on incision.

## 2019-05-26 ENCOUNTER — Telehealth: Payer: Self-pay

## 2019-05-26 ENCOUNTER — Inpatient Hospital Stay: Payer: BC Managed Care – PPO | Admitting: Nurse Practitioner

## 2019-05-26 NOTE — Telephone Encounter (Signed)
Attempted to do SCP visit but patient states she has not received SCP packet in mail.   Rescheduled visit for 06/09/2019 at 2:00 and she states if she has not received packet I will make her another one and she can come by and pick it up.

## 2019-05-26 NOTE — Telephone Encounter (Signed)
See telephone message on (05/19/19)//AB/CMA

## 2019-05-28 ENCOUNTER — Encounter: Payer: Self-pay | Admitting: Hematology and Oncology

## 2019-05-30 ENCOUNTER — Inpatient Hospital Stay: Payer: BC Managed Care – PPO | Attending: Oncology | Admitting: Nurse Practitioner

## 2019-05-30 ENCOUNTER — Other Ambulatory Visit: Payer: Self-pay

## 2019-05-30 VITALS — BP 126/88 | HR 68 | Temp 97.2°F | Resp 16 | Wt 185.0 lb

## 2019-05-30 DIAGNOSIS — L42 Pityriasis rosea: Secondary | ICD-10-CM | POA: Insufficient documentation

## 2019-05-30 DIAGNOSIS — Z170 Estrogen receptor positive status [ER+]: Secondary | ICD-10-CM | POA: Insufficient documentation

## 2019-05-30 DIAGNOSIS — Z79811 Long term (current) use of aromatase inhibitors: Secondary | ICD-10-CM | POA: Diagnosis not present

## 2019-05-30 DIAGNOSIS — K219 Gastro-esophageal reflux disease without esophagitis: Secondary | ICD-10-CM | POA: Insufficient documentation

## 2019-05-30 DIAGNOSIS — C50512 Malignant neoplasm of lower-outer quadrant of left female breast: Secondary | ICD-10-CM | POA: Diagnosis present

## 2019-05-30 DIAGNOSIS — Z79899 Other long term (current) drug therapy: Secondary | ICD-10-CM | POA: Insufficient documentation

## 2019-05-30 DIAGNOSIS — R21 Rash and other nonspecific skin eruption: Secondary | ICD-10-CM

## 2019-05-30 NOTE — Patient Instructions (Addendum)
Pityriasis Rosea Pityriasis rosea is a rash that usually appears on the chest, abdomen, and back. It may also appear on the upper arms and upper legs. It usually begins as a single patch, and then more patches start to develop. The rash may cause mild itching, but it normally does not cause other problems. It usually goes away without treatment. However, it may take weeks or months for the rash to go away completely. What are the causes? The cause of this condition is not known. The condition does not spread from person to person (is not contagious). What increases the risk? This condition is more likely to develop in:  Persons aged 41-35 years.  Pregnant women. It is more common in the spring and fall seasons. What are the signs or symptoms? The main symptom of this condition is a rash.  The rash usually begins with a single oval patch that is larger than the ones that follow. This is called a herald patch. It generally appears a week or more before the rest of the rash appears.  When more patches start to develop, they spread quickly on the chest, abdomen, back, arms, and legs. These patches are smaller than the first one.  The patches that make up the rash are usually oval-shaped and pink or red in color. They are usually flat but may sometimes be raised so that they can be felt with a finger. They may also be finely crinkled and have a scaly ring around the edge. Some people may have mild itching and nonspecific symptoms, such as:  Nausea.  Loss of appetite.  Difficulty concentrating.  Headache.  Irritability.  Sore throat.  Mild fever. How is this diagnosed? This condition may be diagnosed based on:  Your medical history and a physical exam.  Tests to rule out other causes. This may include blood tests or a test in which a small sample of skin is removed from the rash (biopsy) and checked in a lab. How is this treated?     Treatment is not usually needed for this  condition. The rash will often go away on its own in 4-8 weeks. In some cases, a health care provider may recommend or prescribe medicine to reduce itching. Follow these instructions at home:  Take or apply over-the-counter and prescription medicines only as told by your health care provider.  Avoid scratching the affected areas of skin.  Do not take hot baths or use a sauna. Use only warm water when bathing or showering. Heat can increase itching. Adding cornstarch to your bath may help to relieve the itching.  Avoid exposure to the sun and other sources of UV light, such as tanning beds, as told by your health care provider. UV light may help the rash go away but may cause unwanted changes in skin color.  Keep all follow-up visits as told by your health care provider. This is important. Contact a health care provider if:  Your rash does not go away in 8 weeks.  Your rash gets much worse.  You have a fever.  You have swelling or pain in the rash area.  You have fluid, blood, or pus coming from the rash area. Summary  Pityriasis rosea is a rash that usually appears on the trunk of the body. It can also appear on the upper arms and upper legs.  The rash usually begins with a single oval patch (herald patch) that appears a week or more before the rest of the rash appears.   The herald patch is larger than the ones that follow.  The rash may cause mild itching, but it usually does not cause other problems. It usually goes away without treatment in 4-8 weeks.  In some cases, a health care provider may recommend or prescribe medicine to reduce itching. This information is not intended to replace advice given to you by your health care provider. Make sure you discuss any questions you have with your health care provider. Document Released: 07/12/2001 Document Revised: 06/04/2017 Document Reviewed: 06/04/2017 [employer] Patient Education  2020 [employer].

## 2019-05-30 NOTE — Progress Notes (Signed)
Symptom Management [place]  [phone] [phone]  Patient Care Team: [name], PA-C as PCP - General (Family Medicine) Bary Castilla, Forest Gleason, MD (General Surgery) Gae Dry, MD as Referring Physician (Obstetrics and Gynecology) Lequita Asal, MD as Referring Physician (Hematology and Oncology) Rico Junker, RN as Oncology Nurse Navigator Noreene Filbert, MD as Referring Physician (Radiation Oncology) [place] Pink, MD as Consulting Physician (Family Medicine)   Name of the patient: Denise [name]  [id number]  Apr 16, 1976   Date of visit: 06/02/19  Diagnosis-breast cancer  Chief complaint/ Reason for visit-rash  Heme/Onc history:  Oncology History Overview Note  JERNEE MURTAUGH is a 43 y.o. 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  Pathology revealed no residual carcinoma in the breast.  One of three sentinel lymph nodes were positive.  One lymph node had 2 metastatic foci (2.1 mm and 1 mm).  Pathologic stage revealed ypT0 ypN1a.  Index breast mass and axillary node biopsy on 12/06/2017 revealed grade II invasive ductal carcinoma with calcifications at the 11 o'clock position.  There was metastatic carcinoma in 1 of 1 lymph nodes.  Tumor was ER + (100%), PR + (100%), and Ki67 5%.  Her2/neu was 3+ by IHC (heterogeneous) and FISH +.  Diagnostic left mammogram and ultrasound on 12/06/2017 revealed a 1.7 x 1.3 x 1.3 cm irregular hypoechoic mass with internal calcifications at the 11 o'clock position 2 cm from the nipple with internal calcifications. There was a similar appearing 0.9 x 0.8 x 0.7 cm mass at the 11 o'clock position 4 cm from the nipple (2.4 cm from the index lesion).  There were suspicious, segmental calcifications originating from the index lesion and extending 5 cm  posteriorly.  There was a 0.6 cm morphologically abnormal left axillary lymph node.  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 The adjacent and more superiorly located 11 o'clock mass (7 x 12 mm) seen on recent ultrasound, 4 cm from the nipple on ultrasound, was also identified. There were 2 probable satellite lesions (7 mm, medial lesion; posterior and lateral lesion). The total span of malignancy was 3 x 3.1 x 4.5 cm.  There were calcifications extending up to 5 cm posterior to the biopsied malignancy which were highly suspicious on mammography but not appreciated.  There was a mass in the lower outer left breast measured 5 mm, representing a change.  The known metastatic node in the left axilla was identified. A node along the posterolateral margin of the pectoralis minor (cortex 5 mm) was nonspecific but at least somewhat suspicious.  Chest, abdomen, and pelvic CT on 12/19/2017 revealed a solitary enlarged biopsy-proven metastatic left axillary lymph node.  There were no additional findings suspicious for metastatic disease in the chest, abdomen or pelvis.  There was a nonspecific tiny sclerotic upper right sacral lesion, more likely a benign bone island. Bone scintigraphy correlation versus follow-up CT could be considered.  Bone scan on 01/11/2018 was negative for metastatic pattern. No areas of focal tracer uptake. Area of concern in sacrum on CT likely represents benign bone island.   Myriad genetic testing on 08/26/2013 was negative for BRCA1 and 2. 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  She has a family history significant  for breast, ovarian, pancreatic, and prostate cancer.  She received 6 cycles of TCHP chemotherapy (12/21/2017 - 04/17/2018) with Margarette Canada support. Cycle #3 was held due to elevated LFTs on 02/04/2018. She received Herceptin  + Perjeta alone (04/30/2018 - 05/30/2018).  She is s/p cycle #1 Kadcyla (06/20/2018).  She began breast radiation on 06/25/2018  Echocardiograms:  12/20/2017 - EF of 60-65%, 03/27/2018 - EF of 60-65%, and 06/18/2018 - EF of 55-60%.  She has a history of elevated LFTs.  Hepatitis B and C serologies were negative on 02/04/2018.  RUQ ultrasound on 02/06/2018 revealed a small anterior gallbladder wall polyp. There was no evidence of cholelithiasis or cholecystis. CBD measured normal at 2 mm, with no evidence of choledocholithiasis. There was normal direction of blood flow towards the liver noted.   Malignant neoplasm of upper-outer quadrant of female breast ([place])  09/17/2014 Initial Diagnosis   Malignant neoplasm of upper-outer quadrant of female breast ([place])   12/16/2017 - 05/06/2018 Chemotherapy   The patient had palonosetron (ALOXI) injection 0.25 mg, 0.25 mg, Intravenous,  Once, 6 of 6 cycles Administration: 0.25 mg (12/21/2017), 0.25 mg (01/14/2018), 0.25 mg (03/28/2018), 0.25 mg (04/17/2018), 0.25 mg (02/11/2018), 0.25 mg (03/07/2018) pegfilgrastim (NEULASTA ONPRO KIT) injection 6 mg, 6 mg, Subcutaneous, Once, 1 of 1 cycle Administration: 6 mg (12/21/2017) pegfilgrastim-cbqv (UDENYCA) injection 6 mg, 6 mg, Subcutaneous, Once, 6 of 6 cycles Administration: 6 mg (01/15/2018), 6 mg (03/29/2018), 6 mg (04/18/2018), 6 mg (02/12/2018), 6 mg (03/08/2018) trastuzumab (HERCEPTIN) 600 mg in sodium chloride 0.9 % 250 mL chemo infusion, 672 mg, Intravenous,  Once, 6 of 6 cycles Administration: 600 mg (12/21/2017), 450 mg (01/14/2018), 500 mg (03/28/2018), 500 mg (04/17/2018), 500 mg (02/11/2018), 500 mg (03/07/2018) CARBOplatin (PARAPLATIN) 870 mg in sodium chloride 0.9 % 500 mL chemo infusion, 890 mg (100 % of original dose 885 mg), Intravenous,  Once, 6 of 6 cycles Dose modification:   (original dose 885 mg, Cycle 1),   (original dose 885 mg, Cycle 2),   (original dose 885 mg, Cycle 5),   (original dose 885 mg,  Cycle 6),   (original dose 885 mg, Cycle 3),   (original dose 885 mg, Cycle 4) Administration: 870 mg (12/21/2017), 870 mg (01/14/2018), 870 mg (03/28/2018), 870 mg (04/17/2018), 870 mg (02/11/2018), 870 mg (03/07/2018) DOCEtaxel (TAXOTERE) 150 mg in sodium chloride 0.9 % 250 mL chemo infusion, 75 mg/m2 = 150 mg, Intravenous,  Once, 6 of 6 cycles Dose modification: 55 mg/m2 (original dose 75 mg/m2, Cycle 5, Reason: Change in LFTs, Comment: per pharmacy) Administration: 150 mg (12/21/2017), 150 mg (01/14/2018), 110 mg (03/28/2018), 150 mg (04/17/2018), 150 mg (02/11/2018), 150 mg (03/07/2018) pertuzumab (PERJETA) 840 mg in sodium chloride 0.9 % 250 mL chemo infusion, 840 mg, Intravenous, Once, 6 of 6 cycles Administration: 840 mg (12/21/2017), 420 mg (01/14/2018), 420 mg (03/28/2018), 420 mg (04/17/2018), 420 mg (02/11/2018), 420 mg (03/07/2018) fosaprepitant (EMEND) 150 mg, dexamethasone (DECADRON) 12 mg in sodium chloride 0.9 % 145 mL IVPB, , Intravenous,  Once, 6 of 6 cycles Administration:  (12/21/2017),  (01/14/2018),  (03/28/2018),  (04/17/2018),  (02/11/2018),  (03/07/2018)  for chemotherapy treatment.    05/09/2018 - 06/19/2018 Chemotherapy   The patient had trastuzumab (HERCEPTIN) 500 mg in sodium chloride 0.9 % 250 mL chemo infusion, 504 mg, Intravenous,  Once, 2 of 11 cycles Administration: 500 mg (05/30/2018), 500 mg (05/09/2018) pertuzumab (PERJETA) 420 mg in sodium chloride 0.9 % 250 mL chemo infusion, 420 mg, Intravenous, Once, 2 of 11 cycles Administration: 420 mg (  05/30/2018), 420 mg (05/09/2018)  for chemotherapy treatment.    01/17/2019 -  Chemotherapy   The patient had trastuzumab-anns (KANJINTI) 500 mg in sodium chloride 0.9 % 250 mL chemo infusion, 441 mg (100 % of original dose 6 mg/kg), Intravenous,  Once, 3 of 3 cycles Dose modification: 6 mg/kg (original dose 6 mg/kg, Cycle 1, Reason: Provider Judgment), 2 mg/kg (original dose 6 mg/kg, Cycle 3, Reason: Provider Judgment, Comment: 1 week short  of 52 weeks of Her2/neu based treatment) Administration: 500 mg (01/24/2019), 150 mg (02/14/2019)  for chemotherapy treatment.      Interval history- Cicily Bonano, 43 year old female, with above history of breast cancer status postmastectomy with reconstruction, currently on tamoxifen, presents to symptom management clinic for evaluation of rash.  This is a new problem that started approximately 3 weeks ago.  It was incidentally noted during exam when she was seeing her Psychiatric nurse.  At that time, patient describes that [name] observed a large oval patch of discolored/pink/salmon skin on patient's left mid abdomen.  Rash did not itch and have not spread anywhere else at that time.  She was given triamcinolone topical for symptoms.  She says since that time, rash has spread and she now notes spots on chest abdomen arms legs and back.  Describes as light pink small discolored skin areas that she had thought may be eczema because the areas became scaly.  Does not itch and she has been applying the triamcinolone but has not noticed improvement.  Rash does not itch but she has been taking antihistamine. No fever or malaise.  No recent infections.  No new medications. She denies neurologic complaints, recent fevers or illness, easy bleeding or bruising, appetite is stable and she denies weight loss.  She denies chest pain or shortness of breath.  No nausea, vomiting, constipation, or diarrhea.  No urinary complaints.  ECOG FS:1 - Symptomatic but completely ambulatory  Review of systems- Review of Systems  Constitutional: Negative for chills, fever and malaise/fatigue.  HENT: Negative for congestion, ear pain and sore throat.   Respiratory: Negative for cough and shortness of breath.   Cardiovascular: Negative for chest pain and palpitations.  Gastrointestinal: Negative for constipation, diarrhea and nausea.  Genitourinary: Negative for dysuria.  Musculoskeletal: Negative for joint pain and  myalgias.  Skin: Positive for rash. Negative for itching.  Neurological: Negative for dizziness and weakness.  Endo/Heme/Allergies: Negative for environmental allergies.  Psychiatric/Behavioral: Negative for depression. The patient is not nervous/anxious.      Current treatment-tamoxifen  Allergies  Allergen Reactions  . Steri-Strip Compound Benzoin [Benzoin Compound]     Steri-strip ,   . Tape Other (See Comments)    Minor skin irritation     Past Medical History:  Diagnosis Date  . Abnormal breast biopsy    PASH, [name]  . Breast cancer ([place]) 11/2017   left breast; ER/PR/Her2neu POS  . Family history of breast cancer   . Family history of ovarian cancer   . Fibroadenoma of breast, left 2000, 2016  . Genetic screening 08/26/2013   BRCA/BART negative/Myriad, CHEK2 POS 2017  . Increased risk of breast cancer 2017   IBIS=47%  . Left breast lump 06/18/2017   Fluid/drained [name]  . Migraine   . Monoallelic mutation of CHEK2 gene in female patient 2017   increased risk of breast and colon cancer  . Personal history of chemotherapy   . Personal history of radiation therapy     Past Surgical  History:  Procedure Laterality Date  . BREAST BIOPSY Bilateral 09/08/14   neg  . BREAST BIOPSY Left 03/16/2015   Procedure: BREAST BIOPSY WITH NEEDLE LOCALIZATION;  Surgeon: [name], MD;  Location: [facility];  Service: General;  Laterality: Left;  . BREAST BIOPSY Left 11/2017   positive  . BREAST CYST ASPIRATION Left 06/18/2017   cyst aspiration  . BREAST EXCISIONAL BIOPSY Left 09/2014  . BREAST EXCISIONAL BIOPSY Right    age 43's  . BREAST RECONSTRUCTION WITH PLACEMENT OF TISSUE EXPANDER AND FLEX HD (ACELLULAR HYDRATED DERMIS) Left 05/20/2018   Procedure: BREAST RECONSTRUCTION WITH PLACEMENT OF TISSUE EXPANDER AND FLEX HD (ACELLULAR HYDRATED DERMIS);  Surgeon: [name], DO;  Location: [facility];  Service: Plastics;  Laterality: Left;  . BREAST  RECONSTRUCTION WITH PLACEMENT OF TISSUE EXPANDER AND FLEX HD (ACELLULAR HYDRATED DERMIS) Right 12/09/2018   Procedure: BREAST RECONSTRUCTION WITH PLACEMENT OF TISSUE EXPANDER AND FLEX HD (ACELLULAR HYDRATED DERMIS);  Surgeon: [name], DO;  Location: [facility];  Service: Plastics;  Laterality: Right;  . BREAST SURGERY Right March 2000   benign fibroadenoma  . BREAST SURGERY Left 08/08/13   excision  . BREAST SURGERY Left 09/23/14   excision  . ESOPHAGOGASTRODUODENOSCOPY (EGD) WITH PROPOFOL N/A 01/20/2019   Procedure: ESOPHAGOGASTRODUODENOSCOPY (EGD) WITH PROPOFOL;  Surgeon: [name], MD;  Location: [facility];  Service: Gastroenterology;  Laterality: N/A;  . MASTECTOMY Left 05/20/2018  . MASTECTOMY W/ SENTINEL NODE BIOPSY Left 05/20/2018   Procedure: MASTECTOMY WITH SENTINEL LYMPH NODE BIOPSY;  Surgeon: [name], MD;  Location: [facility];  Service: General;  Laterality: Left;  . PORT-A-CATH REMOVAL Right 03/31/2019   Procedure: REMOVAL PORT-A-CATH;  Surgeon: [name], DO;  Location: [facility];  Service: Plastics;  Laterality: Right;  . PORTACATH PLACEMENT Right 12/17/2017   Procedure: INSERTION PORT-A-CATH;  Surgeon: [name], MD;  Location: [facility];  Service: General;  Laterality: Right;  . REMOVAL OF BILATERAL TISSUE EXPANDERS WITH PLACEMENT OF BILATERAL BREAST IMPLANTS Bilateral 03/31/2019   Procedure: REMOVAL OF BILATERAL TISSUE EXPANDERS WITH PLACEMENT OF BILATERAL BREAST IMPLANTS;  Surgeon: [name], DO;  Location: [facility];  Service: Plastics;  Laterality: Bilateral;  2 hours  . SIMPLE MASTECTOMY WITH AXILLARY SENTINEL NODE BIOPSY Right 12/09/2018   Procedure: SIMPLE MASTECTOMY RIGHT;  Surgeon: [name], MD;  Location: [facility];  Service: General;  Laterality: Right;    Social History   Socioeconomic History  . Marital status: Married    Spouse name: Not on file  . Number of children: Not on file  . Years of  education: Not on file  . Highest education level: Not on file  Occupational History  . Not on file  Tobacco Use  . Smoking status: Never Smoker  . Smokeless tobacco: Never Used  Substance and Sexual Activity  . Alcohol use: Yes    Comment: occasionally  . Drug use: No  . Sexual activity: Yes    Birth control/protection: Condom  Other Topics Concern  . Not on file  Social History Narrative  . Not on file   Social Determinants of Health   Financial Resource Strain: Low Risk   . Difficulty of Paying Living Expenses: Not very hard  Food Insecurity: No Food Insecurity  . Worried About Charity fundraiser in the Last Year: Never true  . Ran Out of Food in the Last Year: Never true  Transportation Needs: Unknown  . Lack of Transportation (Medical):  No  . Lack of Transportation (Non-Medical): Not on file  Physical Activity:   . Days of Exercise per Week: Not on file  . Minutes of Exercise per Session: Not on file  Stress:   . Feeling of Stress : Not on file  Social Connections:   . Frequency of Communication with Friends and Family: Not on file  . Frequency of Social Gatherings with Friends and Family: Not on file  . Attends Religious Services: Not on file  . Active Member of Clubs or Organizations: Not on file  . Attends Archivist Meetings: Not on file  . Marital Status: Not on file  Intimate Partner Violence:   . Fear of Current or Ex-Partner: Not on file  . Emotionally Abused: Not on file  . Physically Abused: Not on file  . Sexually Abused: Not on file    Family History  Problem Relation Age of Onset  . Prostate cancer Father 33       requiring tx/hormones  . Breast cancer Paternal Grandmother 25  . Breast cancer Maternal Aunt 60  . Ovarian cancer Maternal Aunt 70  . Breast cancer Paternal Aunt 51  . Pancreatic cancer Maternal Aunt 75     Current Outpatient Medications:  .  acetaminophen (TYLENOL) 500 MG tablet, Take 1,000 mg by mouth every 6 (six)  hours as needed for moderate pain or headache. , Disp: , Rfl:  .  loratadine (CLARITIN) 10 MG tablet, Take 10 mg by mouth daily., Disp: , Rfl:  .  metoprolol succinate (TOPROL-XL) 25 MG 24 hr tablet, Take 1 tablet (25 mg total) by mouth daily. (Patient taking differently: Take 25 mg by mouth every morning. ), Disp: 30 tablet, Rfl: 0 .  omeprazole (PRILOSEC OTC) 20 MG tablet, Take 20 mg by mouth daily as needed (for acid reflux)., Disp: , Rfl:  .  tamoxifen (NOLVADEX) 20 MG tablet, Take 1 tablet by mouth once daily, Disp: 90 tablet, Rfl: 0 .  triamcinolone (KENALOG) 0.025 % ointment, Apply 1 application topically 2 (two) times daily., Disp: 30 g, Rfl: 0 .  diazepam (VALIUM) 2 MG tablet, Take 1 tablet (2 mg total) by mouth every 6 (six) hours as needed for anxiety. (Patient not taking: Reported on 03/20/2019), Disp: 30 tablet, Rfl: 0 .  docusate sodium (COLACE) 100 MG capsule, Take 100 mg by mouth daily., Disp: , Rfl:  .  Ferrous Sulfate 140 (45 Fe) MG TBCR, Take 45 mg by mouth daily., Disp: , Rfl:  .  glucose blood (PRECISION QID TEST) test strip, Use 1 each (1 strip total) once daily Use as instructed., Disp: , Rfl:  .  HYDROcodone-acetaminophen (NORCO) 5-325 MG tablet, Take 1 tablet by mouth every 8 (eight) hours as needed for moderate pain. (Patient not taking: Reported on 05/12/2019), Disp: 15 tablet, Rfl: 0 .  Lancets Misc. (UNISTIK 2 NORMAL) MISC, Use 1 each once daily Use as instructed., Disp: , Rfl:  .  vitamin C (ASCORBIC ACID) 500 MG tablet, Take 500 mg by mouth daily., Disp: , Rfl:  No current facility-administered medications for this visit.  Facility-Administered Medications Ordered in Other Visits:  .  sodium chloride flush (NS) 0.9 % injection 10 mL, 10 mL, Intravenous, PRN, Mike Gip, Melissa C, MD, 10 mL at 04/17/18 0844  Physical exam:  Vitals:   05/30/19 1104  BP: 126/88  Pulse: 68  Resp: 16  Temp: (!) 97.2 F (36.2 C)  TempSrc: Tympanic  Weight: 185 lb (83.9 kg)  Physical Exam Constitutional:      General: She is not in acute distress.    Comments: Unaccompanied.  Wearing mask.  HENT:     Head: Normocephalic and atraumatic.  Eyes:     General: No scleral icterus.    Conjunctiva/sclera: Conjunctivae normal.  Skin:    Findings: Rash present.     Comments: Salmon-colored sharply delimited oval patch approximately 3-4 cm left mid abdomen, Somewhat lightly scaly Additional widespread smaller lesions on chest arms abdomen legs and back.  Rash on back has somewhat of a Christmas tree type pattern.  Lesions  ~1 cm salmon colored.  No lesions on palms of hands.  No hypopigmentation of lesions.  No raised borders  Neurological:     Mental Status: She is alert and oriented to person, place, and time.  Psychiatric:        Mood and Affect: Mood normal.        Behavior: Behavior normal.      CMP Latest Ref Rng & Units 05/09/2019  Glucose 70 - 99 mg/dL 104(H)  BUN 6 - 20 mg/dL 12  Creatinine 0.44 - 1.00 mg/dL 0.79  Sodium 135 - 145 mmol/L 137  Potassium 3.5 - 5.1 mmol/L 4.4  Chloride 98 - 111 mmol/L 103  CO2 22 - 32 mmol/L 25  Calcium 8.9 - 10.3 mg/dL 9.2  Total Protein 6.5 - 8.1 g/dL 7.6  Total Bilirubin 0.3 - 1.2 mg/dL 0.6  Alkaline Phos 38 - 126 U/L 61  AST 15 - 41 U/L 19  ALT 0 - 44 U/L 16   CBC Latest Ref Rng & Units 05/09/2019  WBC 4.0 - 10.5 K/uL 6.3  Hemoglobin 12.0 - 15.0 g/dL 12.7  Hematocrit 36.0 - 46.0 % 38.8  Platelets 150 - 400 K/uL 231    No images are attached to the encounter.  No results found.  Assessment and plan- Patient is a 43 y.o. female with history of breast cancer status post reconstruction who presents to symptom management clinic for evaluation of rash.  1.  Pityriasis rosea like rash- question pityriasis rosea vs drug rash- Tamoxifen has skin rash in 13%. Suspect initial lesion was herald patch. No significant pruritus- Question if secondary to antihistamine and steroid use.  Discussed that PR is often  self-limiting over 6 to 8 weeks but patient concerned of appearance as rashes quite widespread.  Per up-to-date, for management include acyclovir, phototherapy.  Systemic glucocorticoids generally reserved for those with severe pruritus.  Discussed acyclovir 400 mg 3 times a day for 7 days Cy Blamer, Tilden Fossa, Bandyopadhyay, 2015) has been shown to shorten time to clinical resolution without significantly increasing adverse events. Continue topical triamcinolone and antihistamines.  Advised patient to be seen by her dermatologist for reevaluation. If no improvement with acyclovir consider holding tamoxifen to evaluate for response.   Disposition:  Follow up with her dermatologist Return to clinic if symptoms don't improve or worsen.    Visit Diagnosis 1. Pityriasis rosea-like skin eruption     Patient expressed understanding and was in agreement with this plan. She also understands that She can call clinic at any time with any questions, concerns, or complaints.   Thank you for allowing me to participate in the care of this very pleasant patient.   Beckey Rutter, DNP, AGNP-C [facility] [place]  CC: Dr. Mike Gip

## 2019-06-02 ENCOUNTER — Encounter: Payer: Self-pay | Admitting: Nurse Practitioner

## 2019-06-02 ENCOUNTER — Ambulatory Visit: Payer: Self-pay | Admitting: Surgery

## 2019-06-02 MED ORDER — ACYCLOVIR 400 MG PO TABS: 400.0000 mg | ORAL_TABLET | Freq: Three times a day (TID) | ORAL | 0 refills | Status: AC

## 2019-06-09 ENCOUNTER — Inpatient Hospital Stay (HOSPITAL_BASED_OUTPATIENT_CLINIC_OR_DEPARTMENT_OTHER): Payer: BC Managed Care – PPO | Admitting: Oncology

## 2019-06-09 DIAGNOSIS — C50419 Malignant neoplasm of upper-outer quadrant of unspecified female breast: Secondary | ICD-10-CM

## 2019-06-09 DIAGNOSIS — Z170 Estrogen receptor positive status [ER+]: Secondary | ICD-10-CM | POA: Diagnosis not present

## 2019-06-09 DIAGNOSIS — K219 Gastro-esophageal reflux disease without esophagitis: Secondary | ICD-10-CM

## 2019-06-09 DIAGNOSIS — R21 Rash and other nonspecific skin eruption: Secondary | ICD-10-CM | POA: Diagnosis not present

## 2019-06-09 DIAGNOSIS — C50412 Malignant neoplasm of upper-outer quadrant of left female breast: Secondary | ICD-10-CM

## 2019-06-09 DIAGNOSIS — Z803 Family history of malignant neoplasm of breast: Secondary | ICD-10-CM

## 2019-06-09 DIAGNOSIS — Z79811 Long term (current) use of aromatase inhibitors: Secondary | ICD-10-CM | POA: Diagnosis not present

## 2019-06-09 DIAGNOSIS — Z923 Personal history of irradiation: Secondary | ICD-10-CM

## 2019-06-09 DIAGNOSIS — Z9221 Personal history of antineoplastic chemotherapy: Secondary | ICD-10-CM

## 2019-06-09 DIAGNOSIS — Z8042 Family history of malignant neoplasm of prostate: Secondary | ICD-10-CM

## 2019-06-09 DIAGNOSIS — Z79899 Other long term (current) drug therapy: Secondary | ICD-10-CM

## 2019-06-09 DIAGNOSIS — Z800 Family history of malignant neoplasm of digestive organs: Secondary | ICD-10-CM

## 2019-06-09 NOTE — Progress Notes (Signed)
Survivorship Care Plan visit completed.  Treatment summary reviewed and previously mailed to patient.  ASCO answers booklet reviewed and given to patient.  CARE program and Cancer Transitions discussed with patient along with other resources cancer center offers to patients and caregivers.  Patient verbalized understanding. 

## 2019-06-09 NOTE — Progress Notes (Signed)
CLINIC:  Survivorship   REASON FOR VISIT:  Routine follow-up for history of breast cancer.   BRIEF ONCOLOGIC HISTORY:  Oncology History Overview Note  Denise [name] is a 43 y.o. 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  Pathology revealed no residual carcinoma in the breast.  One of three sentinel lymph nodes were positive.  One lymph node had 2 metastatic foci (2.1 mm and 1 mm).  Pathologic stage revealed ypT0 ypN1a.  Index breast mass and axillary node biopsy on 12/06/2017 revealed grade II invasive ductal carcinoma with calcifications at the 11 o'clock position.  There was metastatic carcinoma in 1 of 1 lymph nodes.  Tumor was ER + (100%), PR + (100%), and Ki67 5%.  Her2/neu was 3+ by IHC (heterogeneous) and FISH +.  Diagnostic left mammogram and ultrasound on 12/06/2017 revealed a 1.7 x 1.3 x 1.3 cm irregular hypoechoic mass with internal calcifications at the 11 o'clock position 2 cm from the nipple with internal calcifications. There was a similar appearing 0.9 x 0.8 x 0.7 cm mass at the 11 o'clock position 4 cm from the nipple (2.4 cm from the index lesion).  There were suspicious, segmental calcifications originating from the index lesion and extending 5 cm posteriorly.  There was a 0.6 cm morphologically abnormal left axillary lymph node.  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 The adjacent and more superiorly located 11 o'clock mass (7 x 12 mm) seen on recent ultrasound, 4 cm from the nipple on ultrasound, was also identified. There were 2 probable satellite lesions (7 mm, medial lesion; posterior and lateral lesion). The total span of malignancy was 3 x 3.1 x 4.5 cm.  There were calcifications extending up to 5 cm posterior to the biopsied  malignancy which were highly suspicious on mammography but not appreciated.  There was a mass in the lower outer left breast measured 5 mm, representing a change.  The known metastatic node in the left axilla was identified. A node along the posterolateral margin of the pectoralis minor (cortex 5 mm) was nonspecific but at least somewhat suspicious.  Chest, abdomen, and pelvic CT on 12/19/2017 revealed a solitary enlarged biopsy-proven metastatic left axillary lymph node.  There were no additional findings suspicious for metastatic disease in the chest, abdomen or pelvis.  There was a nonspecific tiny sclerotic upper right sacral lesion, more likely a benign bone island. Bone scintigraphy correlation versus follow-up CT could be considered.  Bone scan on 01/11/2018 was negative for metastatic pattern. No areas of focal tracer uptake. Area of concern in sacrum on CT likely represents benign bone island.   Myriad genetic testing on 08/26/2013 was negative for BRCA1 and 2. 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  She has a family history significant for breast, ovarian, pancreatic, and prostate cancer.  She received 6 cycles of TCHP chemotherapy (12/21/2017 - 04/17/2018) with Margarette Canada support. Cycle #3 was held due to elevated LFTs on 02/04/2018. She received Herceptin + Perjeta alone (04/30/2018 - 05/30/2018).  She is s/p cycle #1 Kadcyla (06/20/2018).  She began breast radiation on 06/25/2018  Echocardiograms:  12/20/2017 - EF of 60-65%, 03/27/2018 - EF of 60-65%, and 06/18/2018 - EF of 55-60%.  She has a history of elevated LFTs.  Hepatitis B  and C serologies were negative on 02/04/2018.  RUQ ultrasound on 02/06/2018 revealed a small anterior gallbladder wall polyp. There was no evidence of cholelithiasis or cholecystis. CBD measured normal at 2 mm, with no evidence of choledocholithiasis. There was normal direction of blood flow towards the liver noted.   Malignant  neoplasm of upper-outer quadrant of female breast ([place])  09/17/2014 Initial Diagnosis   Malignant neoplasm of upper-outer quadrant of female breast ([place])   12/16/2017 - 05/06/2018 Chemotherapy   The patient had palonosetron (ALOXI) injection 0.25 mg, 0.25 mg, Intravenous,  Once, 6 of 6 cycles Administration: 0.25 mg (12/21/2017), 0.25 mg (01/14/2018), 0.25 mg (03/28/2018), 0.25 mg (04/17/2018), 0.25 mg (02/11/2018), 0.25 mg (03/07/2018) pegfilgrastim (NEULASTA ONPRO KIT) injection 6 mg, 6 mg, Subcutaneous, Once, 1 of 1 cycle Administration: 6 mg (12/21/2017) pegfilgrastim-cbqv (UDENYCA) injection 6 mg, 6 mg, Subcutaneous, Once, 6 of 6 cycles Administration: 6 mg (01/15/2018), 6 mg (03/29/2018), 6 mg (04/18/2018), 6 mg (02/12/2018), 6 mg (03/08/2018) trastuzumab (HERCEPTIN) 600 mg in sodium chloride 0.9 % 250 mL chemo infusion, 672 mg, Intravenous,  Once, 6 of 6 cycles Administration: 600 mg (12/21/2017), 450 mg (01/14/2018), 500 mg (03/28/2018), 500 mg (04/17/2018), 500 mg (02/11/2018), 500 mg (03/07/2018) CARBOplatin (PARAPLATIN) 870 mg in sodium chloride 0.9 % 500 mL chemo infusion, 890 mg (100 % of original dose 885 mg), Intravenous,  Once, 6 of 6 cycles Dose modification:   (original dose 885 mg, Cycle 1),   (original dose 885 mg, Cycle 2),   (original dose 885 mg, Cycle 5),   (original dose 885 mg, Cycle 6),   (original dose 885 mg, Cycle 3),   (original dose 885 mg, Cycle 4) Administration: 870 mg (12/21/2017), 870 mg (01/14/2018), 870 mg (03/28/2018), 870 mg (04/17/2018), 870 mg (02/11/2018), 870 mg (03/07/2018) DOCEtaxel (TAXOTERE) 150 mg in sodium chloride 0.9 % 250 mL chemo infusion, 75 mg/m2 = 150 mg, Intravenous,  Once, 6 of 6 cycles Dose modification: 55 mg/m2 (original dose 75 mg/m2, Cycle 5, Reason: Change in LFTs, Comment: per pharmacy) Administration: 150 mg (12/21/2017), 150 mg (01/14/2018), 110 mg (03/28/2018), 150 mg (04/17/2018), 150 mg (02/11/2018), 150 mg (03/07/2018) pertuzumab (PERJETA) 840 mg in  sodium chloride 0.9 % 250 mL chemo infusion, 840 mg, Intravenous, Once, 6 of 6 cycles Administration: 840 mg (12/21/2017), 420 mg (01/14/2018), 420 mg (03/28/2018), 420 mg (04/17/2018), 420 mg (02/11/2018), 420 mg (03/07/2018) fosaprepitant (EMEND) 150 mg, dexamethasone (DECADRON) 12 mg in sodium chloride 0.9 % 145 mL IVPB, , Intravenous,  Once, 6 of 6 cycles Administration:  (12/21/2017),  (01/14/2018),  (03/28/2018),  (04/17/2018),  (02/11/2018),  (03/07/2018)  for chemotherapy treatment.    05/09/2018 - 06/19/2018 Chemotherapy   The patient had trastuzumab (HERCEPTIN) 500 mg in sodium chloride 0.9 % 250 mL chemo infusion, 504 mg, Intravenous,  Once, 2 of 11 cycles Administration: 500 mg (05/30/2018), 500 mg (05/09/2018) pertuzumab (PERJETA) 420 mg in sodium chloride 0.9 % 250 mL chemo infusion, 420 mg, Intravenous, Once, 2 of 11 cycles Administration: 420 mg (05/30/2018), 420 mg (05/09/2018)  for chemotherapy treatment.    01/17/2019 -  Chemotherapy   The patient had trastuzumab-anns (KANJINTI) 500 mg in sodium chloride 0.9 % 250 mL chemo infusion, 441 mg (100 % of original dose 6 mg/kg), Intravenous,  Once, 3 of 3 cycles Dose modification: 6 mg/kg (original dose 6 mg/kg, Cycle 1, Reason: Provider Judgment), 2 mg/kg (original dose 6 mg/kg, Cycle 3, Reason: Provider Judgment, Comment: 1 week short of 52 weeks of Her2/neu based treatment) Administration:  500 mg (01/24/2019), 150 mg (02/14/2019)  for chemotherapy treatment.      INTERVAL HISTORY:  Denise [name] presents to the Survivorship Clinic today for routine follow-up for her history of breast cancer.  Overall, she reports feeling quite well.  She is status post mastectomy with reconstruction currently on tamoxifen.  She was recently seen in our symptom management clinic for a rash. Thought to be pityriasis rosea versus drug rash.   No hot flashes. No menstrual cycles X 7 months.   She is status post prophylactic mastectomy with reconstruction on  12/09/2018.  She underwent implant exchange on 03/31/2019.  She is status post 52 weeks of HER-2/neu directed therapy.  She received 6 cycles of TCHP chemotherapy from 12/21/2017 through 04/17/2018.  She received 8 cycles of Kadcyla from 06/20/2018 through 12/25/2018.  She received kanijinti on 01/24/2019.  She is status post breast radiation from 06/26/2018 through 08/05/2018.  She is on tamoxifen.  REVIEW OF SYSTEMS:  Review of Systems  Constitutional: Negative for appetite change, fatigue, fever and unexpected weight change.  HENT:   Negative for nosebleeds, sore throat and trouble swallowing.   Eyes: Negative.   Respiratory: Negative.  Negative for cough, shortness of breath and wheezing.   Cardiovascular: Negative.  Negative for chest pain and leg swelling.  Gastrointestinal: Negative for abdominal pain, blood in stool, constipation, diarrhea, nausea and vomiting.  Endocrine: Negative.   Genitourinary: Negative.  Negative for bladder incontinence, hematuria and nocturia.   Musculoskeletal: Negative.  Negative for back pain and flank pain.  Skin: Positive for rash.  Neurological: Negative.  Negative for dizziness, headaches, light-headedness and numbness.  Hematological: Negative.   Psychiatric/Behavioral: Negative.  Negative for confusion. The patient is not nervous/anxious.     PAST MEDICAL/SURGICAL HISTORY:  Past Medical History:  Diagnosis Date  . Abnormal breast biopsy    [facility], [name]  . Breast cancer ([place]) 11/2017   left breast; ER/PR/Her2neu POS  . Family history of breast cancer   . Family history of ovarian cancer   . Fibroadenoma of breast, left 2000, 2016  . Genetic screening 08/26/2013   BRCA/BART negative/Myriad, CHEK2 POS 2017  . Increased risk of breast cancer 2017   IBIS=47%  . Left breast lump 06/18/2017   Fluid/drained [name]  . Migraine   . Monoallelic mutation of CHEK2 gene in female patient 2017   increased risk of breast and colon cancer  . Personal history  of chemotherapy   . Personal history of radiation therapy    Past Surgical History:  Procedure Laterality Date  . BREAST BIOPSY Bilateral 09/08/14   neg  . BREAST BIOPSY Left 03/16/2015   Procedure: BREAST BIOPSY WITH NEEDLE LOCALIZATION;  Surgeon: [name], MD;  Location: [facility];  Service: General;  Laterality: Left;  . BREAST BIOPSY Left 11/2017   positive  . BREAST CYST ASPIRATION Left 06/18/2017   cyst aspiration  . BREAST EXCISIONAL BIOPSY Left 09/2014  . BREAST EXCISIONAL BIOPSY Right    age 65's  . BREAST RECONSTRUCTION WITH PLACEMENT OF TISSUE EXPANDER AND FLEX HD (ACELLULAR HYDRATED DERMIS) Left 05/20/2018   Procedure: BREAST RECONSTRUCTION WITH PLACEMENT OF TISSUE EXPANDER AND FLEX HD (ACELLULAR HYDRATED DERMIS);  Surgeon: [name], DO;  Location: [facility];  Service: Plastics;  Laterality: Left;  . BREAST RECONSTRUCTION WITH PLACEMENT OF TISSUE EXPANDER AND FLEX HD (ACELLULAR HYDRATED DERMIS) Right 12/09/2018   Procedure: BREAST RECONSTRUCTION WITH PLACEMENT OF TISSUE EXPANDER AND FLEX HD (ACELLULAR HYDRATED DERMIS);  Surgeon:  Dillingham, [name], DO;  Location: [facility];  Service: Plastics;  Laterality: Right;  . BREAST SURGERY Right March 2000   benign fibroadenoma  . BREAST SURGERY Left 08/08/13   excision  . BREAST SURGERY Left 09/23/14   excision  . ESOPHAGOGASTRODUODENOSCOPY (EGD) WITH PROPOFOL N/A 01/20/2019   Procedure: ESOPHAGOGASTRODUODENOSCOPY (EGD) WITH PROPOFOL;  Surgeon: [name], MD;  Location: [place];  Service: Gastroenterology;  Laterality: N/A;  . MASTECTOMY Left 05/20/2018  . MASTECTOMY W/ SENTINEL NODE BIOPSY Left 05/20/2018   Procedure: MASTECTOMY WITH SENTINEL LYMPH NODE BIOPSY;  Surgeon: [name], MD;  Location: [facility];  Service: General;  Laterality: Left;  . PORT-A-CATH REMOVAL Right 03/31/2019   Procedure: REMOVAL PORT-A-CATH;  Surgeon: [name], DO;  Location: [facility];  Service: Plastics;   Laterality: Right;  . PORTACATH PLACEMENT Right 12/17/2017   Procedure: INSERTION PORT-A-CATH;  Surgeon: [name], MD;  Location: [facility];  Service: General;  Laterality: Right;  . REMOVAL OF BILATERAL TISSUE EXPANDERS WITH PLACEMENT OF BILATERAL BREAST IMPLANTS Bilateral 03/31/2019   Procedure: REMOVAL OF BILATERAL TISSUE EXPANDERS WITH PLACEMENT OF BILATERAL BREAST IMPLANTS;  Surgeon: [name], DO;  Location: [facility];  Service: Plastics;  Laterality: Bilateral;  2 hours  . SIMPLE MASTECTOMY WITH AXILLARY SENTINEL NODE BIOPSY Right 12/09/2018   Procedure: SIMPLE MASTECTOMY RIGHT;  Surgeon: [name], MD;  Location: [facility];  Service: General;  Laterality: Right;     ALLERGIES:  Allergies  Allergen Reactions  . Steri-Strip Compound Benzoin [Benzoin Compound]     Steri-strip ,   . Tape Other (See Comments)    Minor skin irritation      CURRENT MEDICATIONS:  Outpatient Encounter Medications as of 06/09/2019  Medication Sig  . acetaminophen (TYLENOL) 500 MG tablet Take 1,000 mg by mouth every 6 (six) hours as needed for moderate pain or headache.   Marland Kitchen acyclovir (ZOVIRAX) 400 MG tablet Take 1 tablet (400 mg total) by mouth 3 (three) times daily for 7 days. For rash.  . diazepam (VALIUM) 2 MG tablet Take 1 tablet (2 mg total) by mouth every 6 (six) hours as needed for anxiety. (Patient not taking: Reported on 03/20/2019)  . docusate sodium (COLACE) 100 MG capsule Take 100 mg by mouth daily.  . Ferrous Sulfate 140 (45 Fe) MG TBCR Take 45 mg by mouth daily.  Marland Kitchen glucose blood (PRECISION QID TEST) test strip Use 1 each (1 strip total) once daily Use as instructed.  Marland Kitchen HYDROcodone-acetaminophen (NORCO) 5-325 MG tablet Take 1 tablet by mouth every 8 (eight) hours as needed for moderate pain. (Patient not taking: Reported on 05/12/2019)  . Lancets Misc. (UNISTIK 2 NORMAL) MISC Use 1 each once daily Use as instructed.  . loratadine (CLARITIN) 10 MG tablet Take 10 mg by  mouth daily.  . metoprolol succinate (TOPROL-XL) 25 MG 24 hr tablet Take 1 tablet (25 mg total) by mouth daily. (Patient taking differently: Take 25 mg by mouth every morning. )  . omeprazole (PRILOSEC OTC) 20 MG tablet Take 20 mg by mouth daily as needed (for acid reflux).  . tamoxifen (NOLVADEX) 20 MG tablet Take 1 tablet by mouth once daily  . triamcinolone (KENALOG) 0.025 % ointment Apply 1 application topically 2 (two) times daily.  . vitamin C (ASCORBIC ACID) 500 MG tablet Take 500 mg by mouth daily.  . [DISCONTINUED] prochlorperazine (COMPAZINE) 10 MG tablet Take 1 tablet (10 mg total) by mouth every 6 (six) hours as needed (  Nausea or vomiting). (Patient not taking: Reported on 05/06/2018)   Facility-Administered Encounter Medications as of 06/09/2019  Medication  . sodium chloride flush (NS) 0.9 % injection 10 mL     ONCOLOGIC FAMILY HISTORY:  Family History  Problem Relation Age of Onset  . Prostate cancer Father 69       requiring tx/hormones  . Breast cancer Paternal Grandmother 47  . Breast cancer Maternal Aunt 60  . Ovarian cancer Maternal Aunt 70  . Breast cancer Paternal Aunt 34  . Pancreatic cancer Maternal Aunt 75    GENETIC COUNSELING/TESTING: None  SOCIAL HISTORY:  SANIYAH MONDESIR is married and lives with her spouse in [place], [place].  She has 2 children and they live in [place], [place].  Denise [name] is currently working remotely full time.  She denies any current or history of tobacco, alcohol, or illicit drug use.     PHYSICAL EXAMINATION:   There were no vitals filed for this visit. There were no vitals filed for this visit.   LABORATORY DATA:  Lab Results  Component Value Date   WBC 6.3 05/09/2019   HGB 12.7 05/09/2019   HCT 38.8 05/09/2019   MCV 81.9 05/09/2019   PLT 231 05/09/2019     Chemistry      Component Value Date/Time   NA 137 05/09/2019 0950   K 4.4 05/09/2019 0950   CL 103 05/09/2019 0950   CO2 25 05/09/2019 0950     BUN 12 05/09/2019 0950   CREATININE 0.79 05/09/2019 0950      Component Value Date/Time   CALCIUM 9.2 05/09/2019 0950   ALKPHOS 61 05/09/2019 0950   AST 19 05/09/2019 0950   ALT 16 05/09/2019 0950   BILITOT 0.6 05/09/2019 0950      DIAGNOSTIC IMAGING:  Mammogram: 08/21/18 IMPRESSION: No mammographic evidence of malignancy. A result letter of this screening mammogram will be mailed directly to the patient.  RECOMMENDATION: Screening mammogram in one year.  (Code:SM-R-35M)  BI-RADS CATEGORY  2: Benign.   Electronically Signed   By: Fidela Salisbury M.D.   On: 08/21/2018 13:12  ASSESSMENT AND PLAN:  Denise [name] is a pleasant 43 y.o. female with history of Stage 1B Her2/neu+ left breast cancer status post neoadjuvant chemotherapy followed by mastectomy with sentinel node biopsy on 05/20/2018.  She is currently on tamoxifen and will continue for at least 5 years.  She presents to the Survivorship Clinic for surveillance and routine follow-up.   1. History of breast cancer:  Denise [name] is currently clinically and radiographically without evidence of disease or recurrence of breast cancer.  She status post prophylactic mastectomy with reconstruction on 12/09/2018.  Underwent implant exchange on 03/31/2019.  She is status post 52 weeks of HER-2/neu directed therapy completed on 02/14/2019.  She has follow-up with Dr. Mike Gip on 08/12/2018. I encouraged her to call me with any questions or concerns before her next visit at the cancer center, and I would be happy to see her sooner, if needed.    #. Problem(s) at Visit: None: Patient does express concerns for returning to work for in person visits.  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  She is unsure if she will return to in person classes or will continue to work remotely.  She is asking for our suggestions.  #. Bone health:  Given Denise [name] age,  history of breast cancer, and her current anti-estrogen therapy  with Tamoxifen she is at risk for bone demineralization.  No prior history of DEXA scan.  In the meantime, she was encouraged to increase her consumption of foods rich in calcium, as well as increase her weight-bearing activities.  She was given education on specific food and activities to promote bone health.  #. Cancer screening:  Due to Denise [name]'s history and her age, she should receive screening for skin cancers, colon cancer, and gynecologic cancers. She was encouraged to follow-up with her PCP for appropriate cancer screenings.   #. Health maintenance and wellness promotion: Denise [name] was encouraged to consume 5-7 servings of fruits and vegetables per day. She was also encouraged to engage in moderate to vigorous exercise for 30 minutes per day most days of the week. She was instructed to limit her alcohol consumption and continue to abstain from tobacco use/was encouraged stop smoking.    Dispo:  -Return to cancer center on 08/13/19 for md assessment and labwork.  -Repeat mammogram in March 2021 -Plan for scheduling of mammogram at next visit. 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 Mike Gip.  A total of (30) minutes of face-to-face time was spent with this patient with greater than 50% of that time in counseling and care-coordination.  Rulon Abide,  AGNP-C [place] [phone]   Note: PRIMARY CARE PROVIDER [place], [place] [phone]

## 2019-06-23 ENCOUNTER — Encounter: Payer: Self-pay | Admitting: Oncology

## 2019-06-24 NOTE — Telephone Encounter (Signed)
Patient has requested some FMLA paperwork to be filled out for continuing to work from home.  She sent it through my chart directly to me.  Can you guys see this?  How can I forward this to you?  Sonia Baller

## 2019-06-25 NOTE — Telephone Encounter (Signed)
Let me see what I can do!  Faythe Casa, NP 06/25/2019 9:14 AM

## 2019-06-25 NOTE — Telephone Encounter (Signed)
Her FMLA paperwork is under the media tab. I am unsure of who needs this so attached everyone.   Faythe Casa, NP 06/25/2019 9:17 AM

## 2019-07-10 ENCOUNTER — Encounter: Payer: Self-pay | Admitting: Obstetrics and Gynecology

## 2019-07-10 MED ORDER — NITROFURANTOIN MONOHYD MACRO 100 MG PO CAPS: 100.0000 mg | ORAL_CAPSULE | Freq: Two times a day (BID) | ORAL | 0 refills | Status: AC

## 2019-08-11 NOTE — Progress Notes (Signed)
[facility]  [address] [place], [place] [address] Phone: [phone]  Fax: [phone]   Telemedicine Office Visit:  08/13/2019  Referring physician: [name],*  I connected with Denise [name] on 08/13/2019 at 2:57 PM by videoconferencing and verified that I was speaking with the correct person using 2 identifiers.  The patient was at home.  I discussed the limitations, risk, security and privacy concerns of performing an evaluation and management service by videoconferencing and the availability of in person appointments.  I also discussed with the patient that there may be a patient responsible charge related to this service.  The patient expressed understanding and agreed to proceed.   Chief Complaint: Denise [name] is a 44 y.o. female with multi-focal Her2/neu + left breast cancer who is seen for a 3 month assessment.   HPI: The patient was last seen in the medical oncology clinic on 05/12/2019 via telemedicine. At that time, she felt good. She denied any breast concerns. CBC and CMP were normal. Ferritin was 32. CA 27.29 was 21.9. Patient was on oral iron for 2 months. Patient continued tamoxifen.   She had a follow up with [name] on 05/13/2019. Skin was healing nicely s/p bilateral breast surgery. There was no sign of seroma or hematoma. The patient wanted a nipple areola reconstruction with tattoo placement. She was wearing a sports bra and was advised to continue with the sports bra and start massaging.  Patient was seen in the symptoms management clinic by Beckey Rutter, NP on 05/30/2019. She had a rash for 3 weeks. Rash was a large oval patch of discolored salmon skin on left mid abdomen. Rash later spread to chest, abdomen, arms, legs and back.  She was applying the triamcinolone with no improvement. She had been taking antihistamine but denied any itching.  She denied any fever or malaise.  She had no recent  infections. She was on no new medications.  She continued topical triamcinolone and antihistamines. Zenia Resides, NP discussed acyclovir 400 mg 3 times a day for 7 days. Zenia Resides, NP thought rash was pityriasis rosea-like skin eruption.   Patient was seen by Faythe Casa, NP on 06/09/2019. She was doing well. She had no hot flashes. She had no menstrual cycles x 7 months. She was on tamoxifen. Patient was directed to follow up in 07/2019 for MD assessment.   Labs on 08/12/2019: Hematocrit 39.8, hemoglobin 13.0, platelets 204,000, WBC 4,600. CMP was normal. Magnesium was 1.8. CA 27.29 was 19.9.   During the interim, she has felt good. She has stopped her oral iron. She remains on tamoxifen. She is healing well s/p breast surgery. She notes her rash from 12/11/220 has resolved with topical cream. She never saw a dermatologist for the rash because it got better. She reports that the end of care visit with Faythe Casa, NP went well.  She denies any hot flashes or night sweats. She denies any B symptoms. She has no bone or joint issues. She has not had a menstrual cycle since 11/2018. She reports that her finger nails and toe nails are very thin s/p chemotherapy.   Patient is interested in the COVID-19 vaccine. I support the patient getting the vaccine.    Past Medical History:  Diagnosis Date  . Abnormal breast biopsy    [facility], [name]  . Breast cancer ([place]) 11/2017   left breast; ER/PR/Her2neu POS  . Family history of breast cancer   . Family history of  ovarian cancer   . Fibroadenoma of breast, left 2000, 2016  . Genetic screening 08/26/2013   BRCA/BART negative/Myriad, CHEK2 POS 2017  . Increased risk of breast cancer 2017   IBIS=47%  . Left breast lump 06/18/2017   Fluid/drained [name]  . Migraine   . Monoallelic mutation of CHEK2 gene in female patient 2017   increased risk of breast and colon cancer  . Personal history of chemotherapy   . Personal history of radiation therapy      Past Surgical History:  Procedure Laterality Date  . BREAST BIOPSY Bilateral 09/08/14   neg  . BREAST BIOPSY Left 03/16/2015   Procedure: BREAST BIOPSY WITH NEEDLE LOCALIZATION;  Surgeon: [name], MD;  Location: [facility];  Service: General;  Laterality: Left;  . BREAST BIOPSY Left 11/2017   positive  . BREAST CYST ASPIRATION Left 06/18/2017   cyst aspiration  . BREAST EXCISIONAL BIOPSY Left 09/2014  . BREAST EXCISIONAL BIOPSY Right    age 73's  . BREAST RECONSTRUCTION WITH PLACEMENT OF TISSUE EXPANDER AND FLEX HD (ACELLULAR HYDRATED DERMIS) Left 05/20/2018   Procedure: BREAST RECONSTRUCTION WITH PLACEMENT OF TISSUE EXPANDER AND FLEX HD (ACELLULAR HYDRATED DERMIS);  Surgeon: [name], DO;  Location: [facility];  Service: Plastics;  Laterality: Left;  . BREAST RECONSTRUCTION WITH PLACEMENT OF TISSUE EXPANDER AND FLEX HD (ACELLULAR HYDRATED DERMIS) Right 12/09/2018   Procedure: BREAST RECONSTRUCTION WITH PLACEMENT OF TISSUE EXPANDER AND FLEX HD (ACELLULAR HYDRATED DERMIS);  Surgeon: [name], DO;  Location: [facility];  Service: Plastics;  Laterality: Right;  . BREAST SURGERY Right March 2000   benign fibroadenoma  . BREAST SURGERY Left 08/08/13   excision  . BREAST SURGERY Left 09/23/14   excision  . ESOPHAGOGASTRODUODENOSCOPY (EGD) WITH PROPOFOL N/A 01/20/2019   Procedure: ESOPHAGOGASTRODUODENOSCOPY (EGD) WITH PROPOFOL;  Surgeon: [name], MD;  Location: [facility];  Service: Gastroenterology;  Laterality: N/A;  . MASTECTOMY Left 05/20/2018  . MASTECTOMY W/ SENTINEL NODE BIOPSY Left 05/20/2018   Procedure: MASTECTOMY WITH SENTINEL LYMPH NODE BIOPSY;  Surgeon: [name], MD;  Location: [facility];  Service: General;  Laterality: Left;  . PORT-A-CATH REMOVAL Right 03/31/2019   Procedure: REMOVAL PORT-A-CATH;  Surgeon: [name], DO;  Location: [facility];  Service: Plastics;  Laterality: Right;  . PORTACATH PLACEMENT Right 12/17/2017    Procedure: INSERTION PORT-A-CATH;  Surgeon: [name], MD;  Location: [facility];  Service: General;  Laterality: Right;  . REMOVAL OF BILATERAL TISSUE EXPANDERS WITH PLACEMENT OF BILATERAL BREAST IMPLANTS Bilateral 03/31/2019   Procedure: REMOVAL OF BILATERAL TISSUE EXPANDERS WITH PLACEMENT OF BILATERAL BREAST IMPLANTS;  Surgeon: [name], DO;  Location: [facility];  Service: Plastics;  Laterality: Bilateral;  2 hours  . SIMPLE MASTECTOMY WITH AXILLARY SENTINEL NODE BIOPSY Right 12/09/2018   Procedure: SIMPLE MASTECTOMY RIGHT;  Surgeon: [name], MD;  Location: [facility];  Service: General;  Laterality: Right;    Family History  Problem Relation Age of Onset  . Prostate cancer Father 25       requiring tx/hormones  . Breast cancer Paternal Grandmother 65  . Breast cancer Maternal Aunt 60  . Ovarian cancer Maternal Aunt 70  . Breast cancer Paternal Aunt 58  . Pancreatic cancer Maternal Aunt 75    Social History:  reports that she has never smoked. She has never used smokeless tobacco. She reports current alcohol use. She reports that she does not use drugs. She "occasionally" drinks alcohol.  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 During COVID-19 she is working remotely at home. Patient denies known exposures to radiation on toxins. Her husband's name is [name]. She has 2 daughters, [name] and [name] (ages 11 1/2 and 31). Her husband's name is [name]. The patient is alone today.  Participants in the patient's visit and their role in the encounter included the patient and Vito Berger, CMA, today.  The intake visit was provided by Vito Berger, CMA.  Allergies:  Allergies  Allergen Reactions  . Steri-Strip Compound Benzoin [Benzoin Compound]     Steri-strip ,   . Tape Other (See Comments)    Minor skin irritation     Current Medications: Current Outpatient Medications  Medication Sig Dispense Refill  . acetaminophen (TYLENOL)  500 MG tablet Take 1,000 mg by mouth every 6 (six) hours as needed for moderate pain or headache.     . docusate sodium (COLACE) 100 MG capsule Take 100 mg by mouth daily.    . Ferrous Sulfate 140 (45 Fe) MG TBCR Take 45 mg by mouth daily.    Marland Kitchen glucose blood (PRECISION QID TEST) test strip Use 1 each (1 strip total) once daily Use as instructed.    . Lancets Misc. (UNISTIK 2 NORMAL) MISC Use 1 each once daily Use as instructed.    . loratadine (CLARITIN) 10 MG tablet Take 10 mg by mouth daily.    . metoprolol succinate (TOPROL-XL) 25 MG 24 hr tablet Take 1 tablet (25 mg total) by mouth daily. (Patient taking differently: Take 25 mg by mouth every morning. ) 30 tablet 0  . omeprazole (PRILOSEC OTC) 20 MG tablet Take 20 mg by mouth daily as needed (for acid reflux).    . tamoxifen (NOLVADEX) 20 MG tablet Take 1 tablet by mouth once daily 90 tablet 0  . triamcinolone (KENALOG) 0.025 % ointment Apply 1 application topically 2 (two) times daily. 30 g 0  . vitamin C (ASCORBIC ACID) 500 MG tablet Take 500 mg by mouth daily.    . diazepam (VALIUM) 2 MG tablet Take 1 tablet (2 mg total) by mouth every 6 (six) hours as needed for anxiety. (Patient not taking: Reported on 03/20/2019) 30 tablet 0  . HYDROcodone-acetaminophen (NORCO) 5-325 MG tablet Take 1 tablet by mouth every 8 (eight) hours as needed for moderate pain. (Patient not taking: Reported on 05/12/2019) 15 tablet 0   No current facility-administered medications for this visit.   Facility-Administered Medications Ordered in Other Visits  Medication Dose Route Frequency Provider Last Rate Last Admin  . sodium chloride flush (NS) 0.9 % injection 10 mL  10 mL Intravenous PRN Lequita Asal, MD   10 mL at 04/17/18 0844    Review of Systems  Constitutional: Negative.  Negative for chills, diaphoresis, fever, malaise/fatigue and weight loss (flucuates 182-186 pounds).       Feels good.  HENT: Negative.  Negative for congestion, ear pain, hearing  loss, nosebleeds, sinus pain, sore throat and tinnitus.   Eyes: Negative.  Negative for blurred vision, double vision and photophobia.  Respiratory: Negative.  Negative for cough, hemoptysis, sputum production and shortness of breath.   Cardiovascular: Negative.  Negative for chest pain, palpitations, orthopnea, leg swelling and PND.  Gastrointestinal: Negative.  Negative for abdominal pain, blood in stool, constipation, diarrhea, heartburn, melena, nausea and vomiting.       Normal bowels.  Genitourinary: Negative.  Negative for frequency, hematuria and urgency.  No menses since 11/2018.  Musculoskeletal: Negative.  Negative for back pain, falls, joint pain, myalgias and neck pain.  Skin: Negative.  Negative for itching and rash.       Finger/toe nails very thin s/p chemotherapy.  Neurological: Negative.  Negative for dizziness, tremors, sensory change, speech change, focal weakness, weakness and headaches.  Endo/Heme/Allergies: Negative.  Does not bruise/bleed easily.       No hot flashes or night sweats.  Psychiatric/Behavioral: Negative.  Negative for depression and memory loss. The patient is not nervous/anxious and does not have insomnia.   All other systems reviewed and are negative.  Performance status (ECOG): 0  Physical Exam  Constitutional: She is oriented to person, place, and time. She appears well-developed and well-nourished. No distress.  HENT:  Head: Normocephalic and atraumatic.  Short styled brown hair.  Eyes: Conjunctivae and EOM are normal. No scleral icterus.  Glasses.  Neurological: She is alert and oriented to person, place, and time.  Skin: She is not diaphoretic.  Psychiatric: She has a normal mood and affect. Her behavior is normal. Judgment and thought content normal.  Nursing note reviewed.   Appointment on 08/12/2019  Component Date Value Ref Range Status  . CA 27.29 08/12/2019 19.9  0.0 - 38.6 U/mL Final   Comment: (NOTE) Siemens Centaur  Immunochemiluminometric Methodology [facility]) Values obtained with different assay methods or kits cannot be used interchangeably. Results cannot be interpreted as absolute evidence of the presence or absence of malignant disease. Performed At: [facility] [address], [place] [id number] Rush Farmer MD WT:[id number]   . Sodium 08/12/2019 135  135 - 145 mmol/L Final  . Potassium 08/12/2019 4.1  3.5 - 5.1 mmol/L Final  . Chloride 08/12/2019 102  98 - 111 mmol/L Final  . CO2 08/12/2019 25  22 - 32 mmol/L Final  . Glucose, Bld 08/12/2019 103* 70 - 99 mg/dL Final  . BUN 08/12/2019 12  6 - 20 mg/dL Final  . Creatinine, Ser 08/12/2019 0.75  0.44 - 1.00 mg/dL Final  . Calcium 08/12/2019 9.0  8.9 - 10.3 mg/dL Final  . Total Protein 08/12/2019 7.5  6.5 - 8.1 g/dL Final  . Albumin 08/12/2019 4.2  3.5 - 5.0 g/dL Final  . AST 08/12/2019 21  15 - 41 U/L Final  . ALT 08/12/2019 18  0 - 44 U/L Final  . Alkaline Phosphatase 08/12/2019 57  38 - 126 U/L Final  . Total Bilirubin 08/12/2019 0.7  0.3 - 1.2 mg/dL Final  . GFR calc non Af Amer 08/12/2019 >60  >60 mL/min Final  . GFR calc Af Amer 08/12/2019 >60  >60 mL/min Final  . Anion gap 08/12/2019 8  5 - 15 Final   Performed at [facility], [address] [place], [place] [address]  . WBC 08/12/2019 4.6  4.0 - 10.5 K/uL Final  . RBC 08/12/2019 4.56  3.87 - 5.11 MIL/uL Final  . Hemoglobin 08/12/2019 13.0  12.0 - 15.0 g/dL Final  . HCT 08/12/2019 39.8  36.0 - 46.0 % Final  . MCV 08/12/2019 87.3  80.0 - 100.0 fL Final  . MCH 08/12/2019 28.5  26.0 - 34.0 pg Final  . MCHC 08/12/2019 32.7  30.0 - 36.0 g/dL Final  . RDW 08/12/2019 13.1  11.5 - 15.5 % Final  . Platelets 08/12/2019 204  150 - 400 K/uL Final  . nRBC 08/12/2019 0.0  0.0 - 0.2 % Final  . Neutrophils Relative % 08/12/2019 58  %  Final  . Neutro Abs 08/12/2019 2.7  1.7 - 7.7 K/uL Final  . Lymphocytes Relative 08/12/2019 32  % Final  . Lymphs Abs 08/12/2019 1.4   0.7 - 4.0 K/uL Final  . Monocytes Relative 08/12/2019 8  % Final  . Monocytes Absolute 08/12/2019 0.3  0.1 - 1.0 K/uL Final  . Eosinophils Relative 08/12/2019 2  % Final  . Eosinophils Absolute 08/12/2019 0.1  0.0 - 0.5 K/uL Final  . Basophils Relative 08/12/2019 0  % Final  . Basophils Absolute 08/12/2019 0.0  0.0 - 0.1 K/uL Final  . Immature Granulocytes 08/12/2019 0  % Final  . Abs Immature Granulocytes 08/12/2019 0.01  0.00 - 0.07 K/uL Final   Performed at [facility], [address], [place], [place] [address]  . Magnesium 08/12/2019 1.8  1.7 - 2.4 mg/dL Final   Performed at [facility], [address], [place], [place] [address]    Assessment:  AMENDA DUCLOS is a 44 y.o. female with multi-focal stage IB Her2/neu + left breast cancers/p neoadjuvant chemotherapy followed by mastectomywith sentinel lymph node biopsy on 05/20/2018. Pathologyrevealed no residual carcinoma in the breast. One of three sentinel lymph nodes were positive. One lymph node had 2 metastatic foci (2.1 mm and 1 mm). Pathologic stagerevealed ypT0 ypN1a.  Index breast mass and axillary nodebiopsyon 12/06/2017 revealed grade II invasive ductal carcinoma with calcifications at the 11 o'clock position. There was metastatic carcinoma in 1 of 1 lymph nodes. Tumor was ER + (100%), PR + (100%), and Ki67 5%. Her2/neu was 3+ by IHC (heterogeneous) and FISH +.  Diagnostic left mammogram and ultrasoundon 12/06/2017 revealed a 1.7 x 1.3 x 1.3 cm irregular hypoechoic mass with internal calcifications at the 11 o'clock position 2 cm from the nipple with internal calcifications. There was a similar appearing 0.9 x 0.8 x 0.7 cm mass at the 11 o'clock position 4 cm from the nipple (2.4 cm from the index lesion). There were suspicious, segmental calcifications originating from the index lesion and extending 5 cm posteriorly. There was a 0.6 cm morphologically abnormal left axillary lymph  node.  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 The adjacent and more superiorly located 11 o'clock mass (7 x 12 mm) seen on recent ultrasound, 4 cm from the nipple on ultrasound, was also identified. There were 2 probable satellite lesions (7 mm, medial lesion; posterior and lateral lesion). The total span of malignancywas 3 x 3.1 x 4.5 cm. There were calcificationsextending up to 5 cm posterior to the biopsied malignancy which were highly suspicious on mammography but not appreciated. There was a mass in the lower outer left breast measured 5 mm, representing a change. The known metastatic nodein the left axilla was identified. A node along the posterolateral margin of the pectoralis minor (cortex 5 mm) was nonspecific but at least somewhat suspicious.  Chest, abdomen, and pelvic CTon 12/19/2017 revealed a solitary enlarged biopsy-proven metastatic left axillary lymph node. There were no additional findings suspicious for metastatic disease in the chest, abdomen or pelvis. There was a nonspecific tiny sclerotic upper right sacral lesion, more likely a benign bone island. Bone scintigraphy correlation versus follow-up CT could be considered.  Bone scanon 01/11/2018 was negative for metastatic pattern. No areas of focal tracer uptake. Area of concern in sacrum on CT likely represents benign bone island.   Myriad genetic [employer]  08/26/2013 was negative for BRCA1 and 2. CHEK2 mutationpositive (increased risk of breast and colon cancer). She has a family history significant for breast, ovarian, pancreatic, and prostate cancer.  She received 6 cycles of TCHP chemotherapy (12/21/2017 - 04/17/2018) with Margarette [place] support. Cycle #3 was held due to elevated LFTs on 02/04/2018. She received Herceptin + Perjeta alone (04/30/2018 - 05/30/2018). Shereceived8cycles of  Kadcyla(06/20/2018 - 07/11/2018; 08/20/2018- 12/25/2018). Cycle #3 was held on 08/01/2018 due to visual changes (blurred vision).Cycle #8 was complicated by epistaxis requiring admission.She received Kanjintion 01/24/2019 (last 02/14/2019).  She received breast radiationfrom 06/26/2018 - 08/05/2018.Sheunderwentright prophylactic mastectomywithreconstructionon 12/09/2018. Pathologyon 12/10/2018 revealed benign breast tissue. Sentinellymphnode was negative.  She underwent bilateral exchange of tissue expanders for implants and bilateral capsulotomies on 03/31/2019.  CA27.29 has been followed: 24.0 on 12/12/2017, 21.9 on 05/09/2019, and 19.9 on 08/12/2019.  Echocardiograms: EF 60-65% on 12/20/2017, 60-65% on 03/27/2018, 55-60% on 06/18/2018, 55-60% on 09/27/2018, and 60-65% on 12/25/2018.  She has a history of elevated LFTs. Hepatitis B and C serologies were negative on 02/04/2018.RUQ ultrasound on 02/06/2018 revealed a small anterior gallbladder wall polyp. There was no evidence of cholelithiasis or cholecystis. CBD measured normal at 2 mm, with no evidence of choledocholithiasis. There was normal direction of blood flow towards the liver noted.  She has vasomotor symptoms. She discontinued Effexor around 09/18/2018. She was amenorrheic from 11/2017 - 10/15/2018. [place] and estradiol confirmed a premenopausalstate on 10/01/2018. Mensesrestarted on 10/15/2018.  She has iron deficiency.  She is off oral iron.  Ferritin was 21 on 02/14/2019 and 32 on 05/09/2019.  She was [facility] to [facility] from 01/08/2019 - 01/09/2019 for persistent epistaxis.HeadCTrevealed minimal mucosal thickening of the right sphenoid sinus and was otherwise normal. She underwent right nare airpacking. She is on doxycycline 171m x10 days.Etiology wasfelt secondary tohigh blood pressure. She was discharged on metoprolol.  Symptomatically, she is doing well.  She denies any  concerns.  Plan: 1.   Review labs from 08/12/2019. 2.Stage IB multifocal LEFT breast cancer Clinically, she is doing well. She iss/pprophylactic mastectomy with reconstruction on 12/09/2018. She underwent implant exchange on 03/31/2019. Sheis s/p 52weeks of HER-2/neu directed therapy (last 02/14/2019). She is tolerating tamoxifen well without side effect. Continue tamoxifen. 3.Iron deficiency anemia Hematocrit 25.8. Hemoglobin8.4. MCV 86.9on07/31/2020. Hematocrit 34.6. Hemoglobin 11.2. MCV 85.9 on 02/14/2019.             Hematocrit 38.8.  Hemoglobin 12.7.  MCV 81.9 on 05/09/2019.  Hematocrit 39.8.  Hemoglobin 11.0.  MCV 87.3 on 08/12/2019.             Ferritin was 21 on 02/14/2019 and 32 on 05/09/2019. She is off oral iron.  Continue to monitor.  4.   RTC in 4 months for MD assessment and labs (CBC with differential, CMP, CA 27.29).    I discussed the assessment and treatment plan with the patient.  The patient was provided an opportunity to ask questions and all were answered.  The patient agreed with the plan and demonstrated an understanding of the instructions.  The patient was advised to call back if the symptoms worsen or if the condition fails to improve as anticipated.  I provided 13 minutes (2:57 PM- 3:09 PM) of face-to-face video visit time during this this encounter and > 50% was spent counseling as documented under my assessment and plan.  An additional 5 minutes were spent reviewing her chart (Epic and Care Everywhere) including notes, labs, and imaging studies. [facility].  Lequita Asal, MD, PhD    08/13/2019, 2:57 PM  I, Selena Batten, am acting as scribe for [employer]. Mike Gip, MD, PhD.  I, Jace Fermin C. Mike Gip, MD, have reviewed the above documentation for accuracy and completeness, and I agree with the above.

## 2019-08-12 ENCOUNTER — Inpatient Hospital Stay: Payer: BC Managed Care – PPO | Attending: Hematology and Oncology

## 2019-08-12 ENCOUNTER — Other Ambulatory Visit: Payer: Self-pay

## 2019-08-12 DIAGNOSIS — D509 Iron deficiency anemia, unspecified: Secondary | ICD-10-CM | POA: Insufficient documentation

## 2019-08-12 DIAGNOSIS — Z803 Family history of malignant neoplasm of breast: Secondary | ICD-10-CM | POA: Diagnosis not present

## 2019-08-12 DIAGNOSIS — Z79810 Long term (current) use of selective estrogen receptor modulators (SERMs): Secondary | ICD-10-CM | POA: Insufficient documentation

## 2019-08-12 DIAGNOSIS — C773 Secondary and unspecified malignant neoplasm of axilla and upper limb lymph nodes: Secondary | ICD-10-CM | POA: Insufficient documentation

## 2019-08-12 DIAGNOSIS — Z9012 Acquired absence of left breast and nipple: Secondary | ICD-10-CM | POA: Insufficient documentation

## 2019-08-12 DIAGNOSIS — C50512 Malignant neoplasm of lower-outer quadrant of left female breast: Secondary | ICD-10-CM | POA: Insufficient documentation

## 2019-08-12 DIAGNOSIS — Z170 Estrogen receptor positive status [ER+]: Secondary | ICD-10-CM | POA: Diagnosis not present

## 2019-08-12 DIAGNOSIS — Z8042 Family history of malignant neoplasm of prostate: Secondary | ICD-10-CM | POA: Insufficient documentation

## 2019-08-12 DIAGNOSIS — Z79899 Other long term (current) drug therapy: Secondary | ICD-10-CM | POA: Insufficient documentation

## 2019-08-12 DIAGNOSIS — Z800 Family history of malignant neoplasm of digestive organs: Secondary | ICD-10-CM | POA: Insufficient documentation

## 2019-08-12 DIAGNOSIS — Z8041 Family history of malignant neoplasm of ovary: Secondary | ICD-10-CM | POA: Insufficient documentation

## 2019-08-12 DIAGNOSIS — D500 Iron deficiency anemia secondary to blood loss (chronic): Secondary | ICD-10-CM

## 2019-08-12 DIAGNOSIS — Z5112 Encounter for antineoplastic immunotherapy: Secondary | ICD-10-CM

## 2019-08-12 DIAGNOSIS — C50419 Malignant neoplasm of upper-outer quadrant of unspecified female breast: Secondary | ICD-10-CM

## 2019-08-12 LAB — CBC WITH DIFFERENTIAL/PLATELET
Abs Immature Granulocytes: 0.01 10*3/uL (ref 0.00–0.07)
Basophils Absolute: 0 10*3/uL (ref 0.0–0.1)
Basophils Relative: 0 %
Eosinophils Absolute: 0.1 10*3/uL (ref 0.0–0.5)
Eosinophils Relative: 2 %
HCT: 39.8 % (ref 36.0–46.0)
Hemoglobin: 13 g/dL (ref 12.0–15.0)
Immature Granulocytes: 0 %
Lymphocytes Relative: 32 %
Lymphs Abs: 1.4 10*3/uL (ref 0.7–4.0)
MCH: 28.5 pg (ref 26.0–34.0)
MCHC: 32.7 g/dL (ref 30.0–36.0)
MCV: 87.3 fL (ref 80.0–100.0)
Monocytes Absolute: 0.3 10*3/uL (ref 0.1–1.0)
Monocytes Relative: 8 %
Neutro Abs: 2.7 10*3/uL (ref 1.7–7.7)
Neutrophils Relative %: 58 %
Platelets: 204 10*3/uL (ref 150–400)
RBC: 4.56 MIL/uL (ref 3.87–5.11)
RDW: 13.1 % (ref 11.5–15.5)
WBC: 4.6 10*3/uL (ref 4.0–10.5)
nRBC: 0 % (ref 0.0–0.2)

## 2019-08-12 LAB — COMPREHENSIVE METABOLIC PANEL
ALT: 18 U/L (ref 0–44)
AST: 21 U/L (ref 15–41)
Albumin: 4.2 g/dL (ref 3.5–5.0)
Alkaline Phosphatase: 57 U/L (ref 38–126)
Anion gap: 8 (ref 5–15)
BUN: 12 mg/dL (ref 6–20)
CO2: 25 mmol/L (ref 22–32)
Calcium: 9 mg/dL (ref 8.9–10.3)
Chloride: 102 mmol/L (ref 98–111)
Creatinine, Ser: 0.75 mg/dL (ref 0.44–1.00)
GFR calc Af Amer: >60 mL/min (ref >60)
GFR calc non Af Amer: >60 mL/min (ref >60)
Glucose, Bld: 103 mg/dL — ABNORMAL HIGH (ref 70–99)
Potassium: 4.1 mmol/L (ref 3.5–5.1)
Sodium: 135 mmol/L (ref 135–145)
Total Bilirubin: 0.7 mg/dL (ref 0.3–1.2)
Total Protein: 7.5 g/dL (ref 6.5–8.1)

## 2019-08-12 LAB — MAGNESIUM: Magnesium: 1.8 mg/dL (ref 1.7–2.4)

## 2019-08-13 ENCOUNTER — Encounter: Payer: Self-pay | Admitting: Hematology and Oncology

## 2019-08-13 ENCOUNTER — Inpatient Hospital Stay (HOSPITAL_BASED_OUTPATIENT_CLINIC_OR_DEPARTMENT_OTHER): Payer: BC Managed Care – PPO | Admitting: Hematology and Oncology

## 2019-08-13 ENCOUNTER — Other Ambulatory Visit: Payer: BC Managed Care – PPO

## 2019-08-13 DIAGNOSIS — C50419 Malignant neoplasm of upper-outer quadrant of unspecified female breast: Secondary | ICD-10-CM | POA: Diagnosis not present

## 2019-08-13 DIAGNOSIS — Z79811 Long term (current) use of aromatase inhibitors: Secondary | ICD-10-CM | POA: Diagnosis not present

## 2019-08-13 DIAGNOSIS — D500 Iron deficiency anemia secondary to blood loss (chronic): Secondary | ICD-10-CM

## 2019-08-13 DIAGNOSIS — Z923 Personal history of irradiation: Secondary | ICD-10-CM

## 2019-08-13 DIAGNOSIS — Z9221 Personal history of antineoplastic chemotherapy: Secondary | ICD-10-CM

## 2019-08-13 DIAGNOSIS — Z170 Estrogen receptor positive status [ER+]: Secondary | ICD-10-CM | POA: Diagnosis not present

## 2019-08-13 DIAGNOSIS — K219 Gastro-esophageal reflux disease without esophagitis: Secondary | ICD-10-CM

## 2019-08-13 DIAGNOSIS — Z79899 Other long term (current) drug therapy: Secondary | ICD-10-CM

## 2019-08-13 LAB — CANCER ANTIGEN 27.29: CA 27.29: 19.9 U/mL (ref 0.0–38.6)

## 2019-08-13 NOTE — Progress Notes (Signed)
No new changes noted today. The patient Name and DOB has been verified by phone today. 

## 2019-08-14 ENCOUNTER — Telehealth: Payer: Self-pay | Admitting: *Deleted

## 2019-08-14 ENCOUNTER — Encounter: Payer: Self-pay | Admitting: Hematology and Oncology

## 2019-08-14 DIAGNOSIS — C50419 Malignant neoplasm of upper-outer quadrant of unspecified female breast: Secondary | ICD-10-CM

## 2019-08-14 MED ORDER — TAMOXIFEN CITRATE 20 MG PO TABS: 20.0000 mg | ORAL_TABLET | Freq: Every day | ORAL | 0 refills | Status: DC

## 2019-08-14 NOTE — Telephone Encounter (Signed)
Refill sent.

## 2019-08-19 ENCOUNTER — Ambulatory Visit: Payer: BC Managed Care – PPO | Admitting: Plastic Surgery

## 2019-08-19 ENCOUNTER — Other Ambulatory Visit: Payer: Self-pay

## 2019-08-19 ENCOUNTER — Encounter: Payer: Self-pay | Admitting: Plastic Surgery

## 2019-08-19 VITALS — BP 117/62 | HR 76 | Temp 97.3°F | Ht 67.0 in | Wt 177.0 lb

## 2019-08-19 DIAGNOSIS — Z9011 Acquired absence of right breast and nipple: Secondary | ICD-10-CM

## 2019-08-19 DIAGNOSIS — Z9012 Acquired absence of left breast and nipple: Secondary | ICD-10-CM | POA: Diagnosis not present

## 2019-08-19 DIAGNOSIS — Z9889 Other specified postprocedural states: Secondary | ICD-10-CM | POA: Diagnosis not present

## 2019-08-19 NOTE — Progress Notes (Signed)
   Subjective:    Patient ID: Denise [name], female    DOB: 02-18-1976, 44 y.o.   MRN: [id number]  The patient is a 44 yrs old wf here for follow up on her bilateral breast surgery.  In October 2020 she underwent exchange of the expanders for the silicone implants. The implants feel stable and no sign of issues.  The skin is well healed.  There is no sign of seroma.  The left breast is slightly firm as a result of the radiation.  She has excellent range of motion.       Review of Systems  Constitutional: Negative for activity change and appetite change.  HENT: Negative.   Eyes: Negative.   Respiratory: Negative.  Negative for chest tightness.   Cardiovascular: Negative.   Endocrine: Negative for polyuria.  Genitourinary: Negative.   Musculoskeletal: Negative.  Negative for back pain.  Hematological: Negative.   Psychiatric/Behavioral: Negative.        Objective:   Physical Exam Vitals and nursing note reviewed.  Constitutional:      Appearance: Normal appearance.  HENT:     Head: Normocephalic and atraumatic.  Cardiovascular:     Rate and Rhythm: Normal rate.     Pulses: Normal pulses.  Pulmonary:     Effort: Pulmonary effort is normal. No respiratory distress.  Abdominal:     General: Abdomen is flat.  Skin:    General: Skin is warm.     Capillary Refill: Capillary refill takes less than 2 seconds.  Neurological:     General: No focal deficit present.     Mental Status: She is alert and oriented to person, place, and time.  Psychiatric:        Mood and Affect: Mood normal.        Behavior: Behavior normal.        Assessment & Plan:     ICD-10-CM   1. Acquired absence of right breast  Z90.11   2. Status post left mastectomy  Z90.12   3. S/P breast reconstruction, bilateral  Z98.890     Massage breast.  We discussed the recommendations for evaluation of the silicone implants every 3 years with U/S.  She is interested in [place] tattoo reconstruction. We can get  that set up in the office.  Pictures were obtained of the patient and placed in the chart with the patient's or guardian's permission.

## 2019-08-24 ENCOUNTER — Ambulatory Visit: Payer: BC Managed Care – PPO | Attending: Internal Medicine

## 2019-08-24 DIAGNOSIS — Z23 Encounter for immunization: Secondary | ICD-10-CM | POA: Insufficient documentation

## 2019-08-24 NOTE — Progress Notes (Signed)
   Covid-19 Vaccination Clinic  Name:  [name]    MRN: [id number] DOB: Nov 23, 1975  08/24/2019  [name] was observed post Covid-19 immunization for 15 minutes without incident. She was provided with Vaccine Information Sheet and instruction to access the V-Safe system.   [name] was instructed to call 911 with any severe reactions post vaccine: Marland Kitchen Difficulty breathing  . Swelling of face and throat  . A fast heartbeat  . A bad rash all over body  . Dizziness and weakness   Immunizations Administered    Name Date Dose VIS Date Route   Pfizer COVID-19 Vaccine 08/24/2019 12:28 PM 0.3 mL 05/30/2019 Intramuscular   Manufacturer: [place]   Lot: [id number]   [place]: [id number]

## 2019-09-16 ENCOUNTER — Ambulatory Visit: Payer: BC Managed Care – PPO | Attending: Internal Medicine

## 2019-09-16 DIAGNOSIS — Z23 Encounter for immunization: Secondary | ICD-10-CM

## 2019-09-16 NOTE — Progress Notes (Signed)
   Covid-19 Vaccination Clinic  Name:  MALLISSA ANEZ    MRN: [id number] DOB: 1976/05/10  09/16/2019  Ms. Leight was observed post Covid-19 immunization for 15 minutes without incident. She was provided with Vaccine Information Sheet and instruction to access the V-Safe system.   Ms. Siegle was instructed to call 911 with any severe reactions post vaccine: Marland Kitchen Difficulty breathing  . Swelling of face and throat  . A fast heartbeat  . A bad rash all over body  . Dizziness and weakness   Immunizations Administered    Name Date Dose VIS Date Route   Pfizer COVID-19 Vaccine 09/16/2019  4:30 PM 0.3 mL 05/30/2019 Intramuscular   Manufacturer: [place]   Lot: [id number]   [place]: [id number]

## 2019-09-25 DIAGNOSIS — I10 Essential (primary) hypertension: Secondary | ICD-10-CM | POA: Insufficient documentation

## 2019-09-25 DIAGNOSIS — Z853 Personal history of malignant neoplasm of breast: Secondary | ICD-10-CM | POA: Insufficient documentation

## 2019-10-02 IMAGING — MG DIGITAL DIAGNOSTIC UNILATERAL LEFT MAMMOGRAM WITH TOMO AND CAD
8 series · 8 of 20 positions shown · non-contrast
Comparison: Previous exam(s).

CLINICAL DATA: 42-year-old female recalled from screening mammogram
dated 11/29/2017 for a left breast mass, calcifications and
prominent axillary lymph node. The patient has history of multiple
bilateral biopsies and a strong family history of breast cancer in
multiple aunts and a grandmother.

EXAM:
DIGITAL DIAGNOSTIC LEFT MAMMOGRAM WITH CAD AND TOMO
ULTRASOUND LEFT BREAST

[L ML]
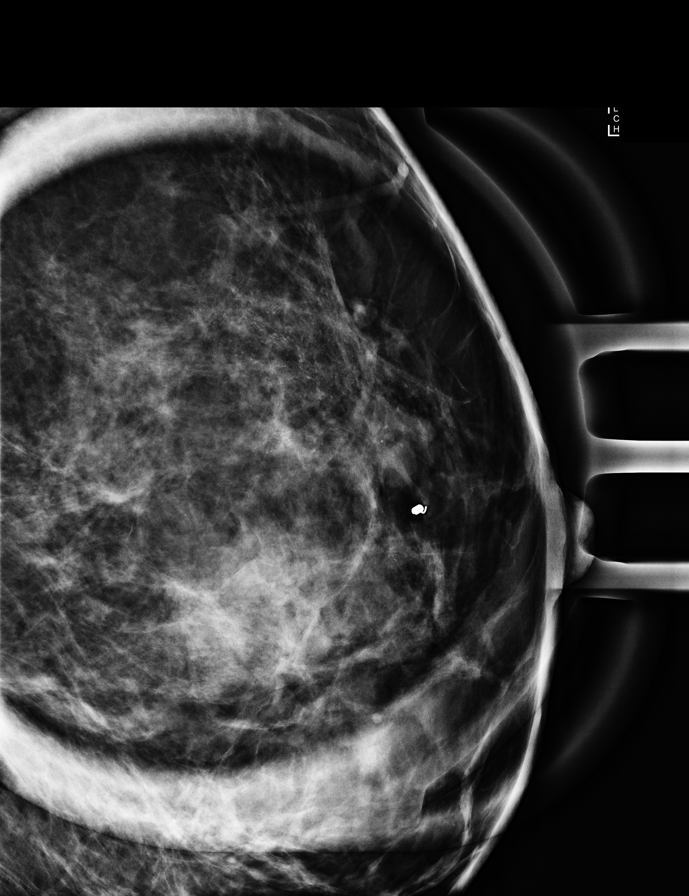

[L CC]
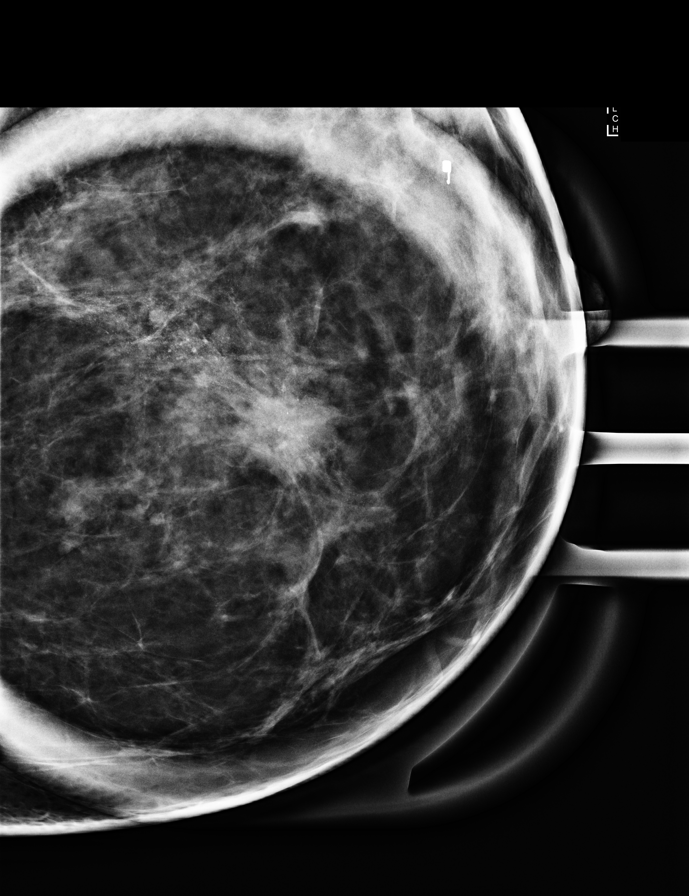

[L MLO synth-2D]
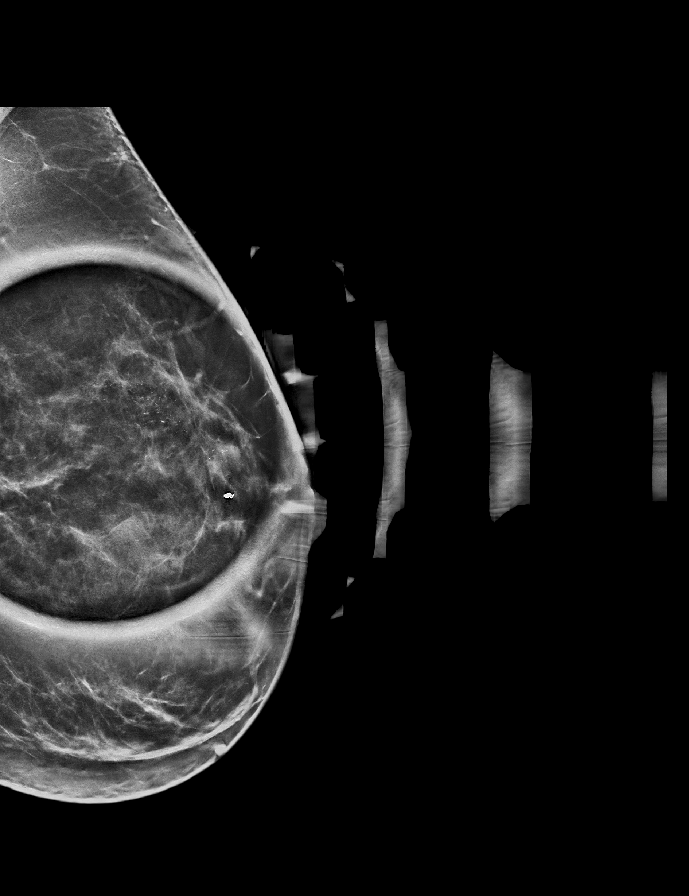

[L CC synth-2D]
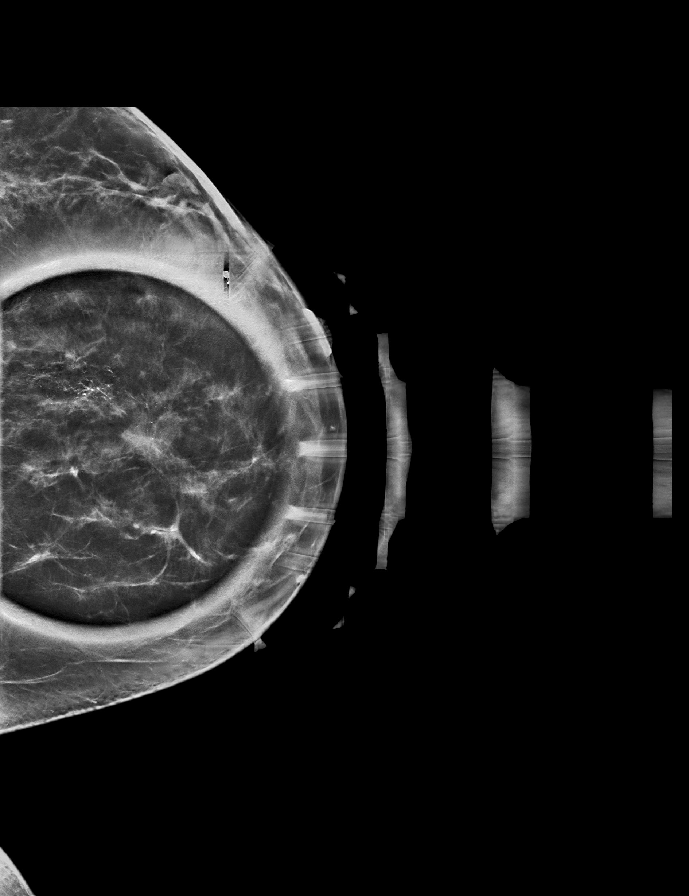

[L ML synth-2D]
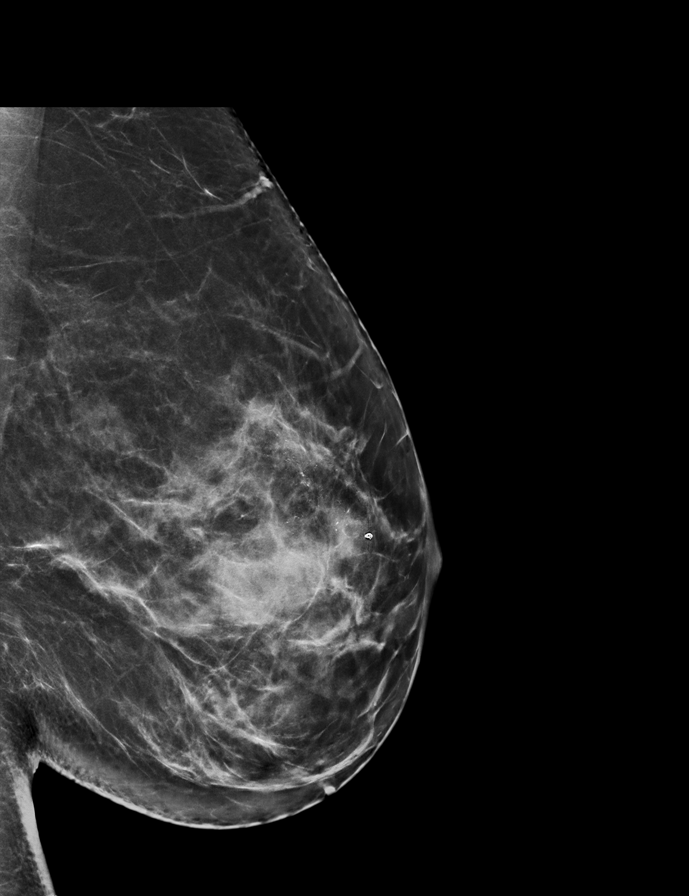

[L CC tomo · tomo slice 33/65.0]
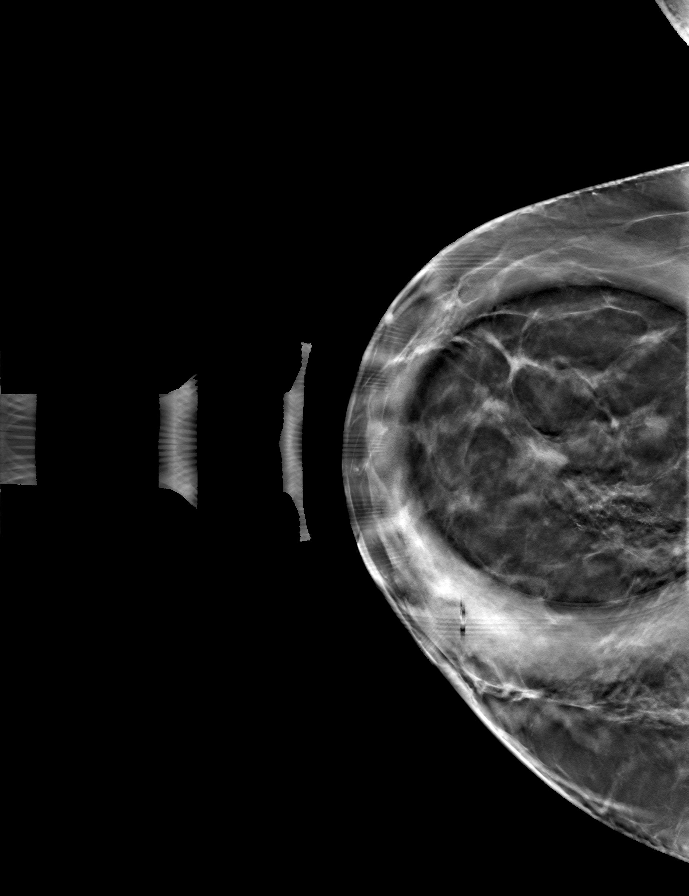

[L ML tomo · tomo slice 39/77.0]
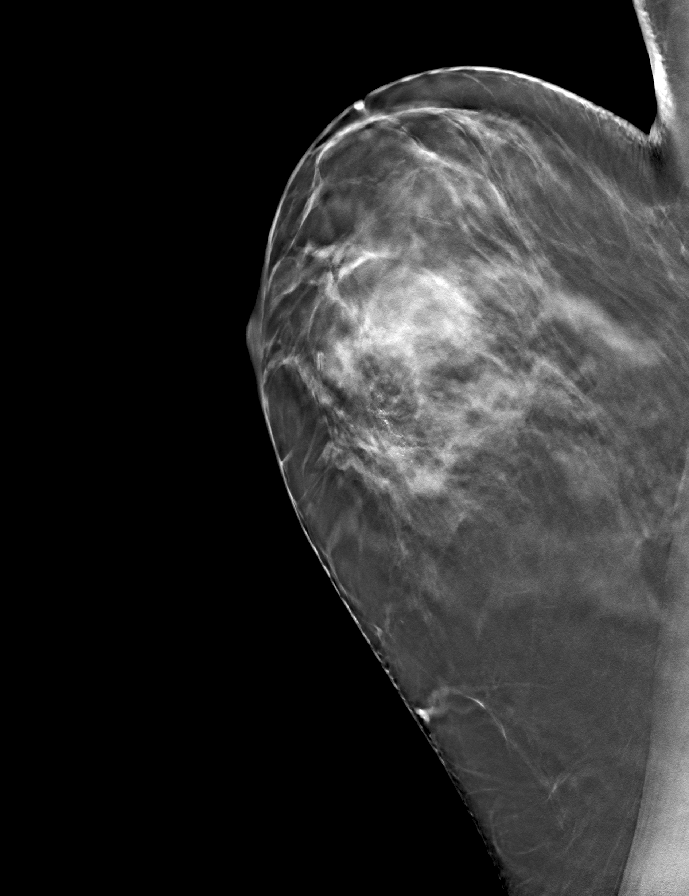

[L MLO tomo · tomo slice 35/69.0]
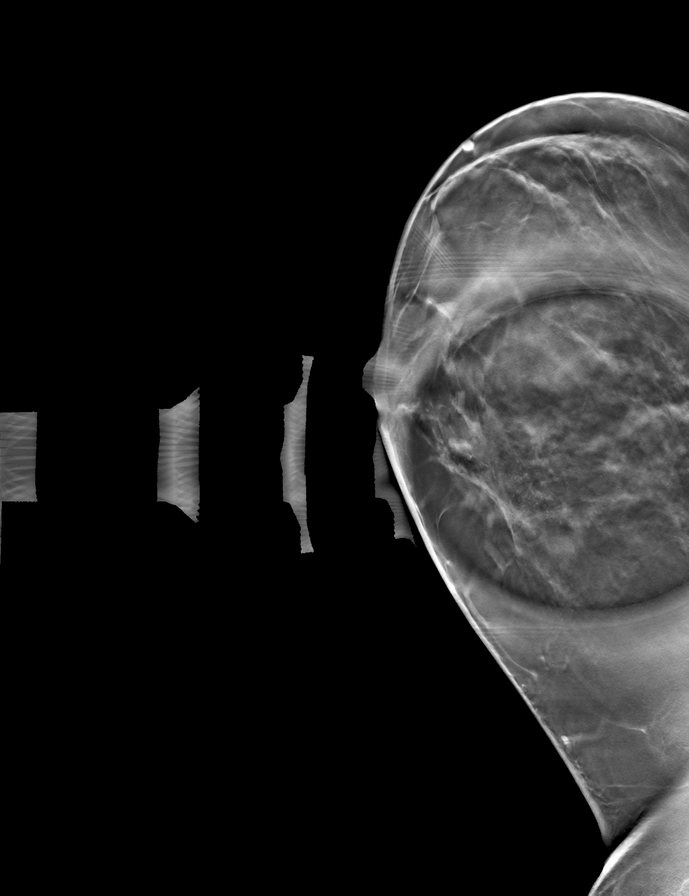

[8 of 20 positions shown; findings below may reference images not displayed]

ACR Breast Density Category c: The breast tissue is heterogeneously
dense, which may obscure small masses.
FINDINGS: Additional 3D mammographic views today demonstrate an irregular,
hyperdense mass with associated distortion in the superior medial
right breast at anterior depth. This is seen in association with a
segmental distribution of pleomorphic calcifications that extends
approximately 5 cm posteriorly. Further evaluation with ultrasound
was performed.

Mammographic images were processed with CAD.

Targeted ultrasound is performed, showing an irregular hypoechoic
mass with internal calcifications at the 11 o'clock position 2 cm
from the nipple. It measures 1.7 x 1.3 x 1.3 cm. Note is made of
internal vascularity. This correlates with the mammographic
mass/distortion.

An additional, similar-appearing masses identified at the 11 o'clock
position 4 cm from the nipple. It measures 0.9 x 0.8 x 0.7 cm. Note
is made of a feeding peripheral vessel. This mass is approximately
2.4 cm from the index lesion.

Evaluation of the lateral left breast demonstrates a previously
biopsied fibroadenoma with associated post biopsy clip at the 1
o'clock retroareolar position

Evaluation of the left axilla demonstrates a morphologically
abnormal lymph node with up to 0.6 cm of diffuse cortical
thickening.
IMPRESSION: 1. Highly suspicious left breast mass at the 11 o'clock position 2
cm from the nipple. Recommendation is for ultrasound-guided biopsy.
2. Suspicious left breast mass at the 11 o'clock position 4 cm from
the nipple, concerning for satellite lesion.
3. Suspicious, segmental calcifications originating from the index
lesion and extending approximately 5 cm posteriorly. Recommendation
is for stereotactic biopsy along the posterior aspect of the
calcifications for evaluation of extent of disease.
4. Morphologically abnormal left axillary lymph node. Recommendation
is for ultrasound-guided biopsy.

RECOMMENDATION:
1. 2 area ultrasound-guided biopsy of the patient's index mass at
the 11 o'clock position 2 cm from the nipple and an abnormal left
axillary lymph node.
2. Additional stereotactic biopsy along the posterior aspect of
suspicious segmental calcifications which span approximately 5 cm.
3. Pending pathology results, suggestion is for additional
evaluation with bilateral breast MRI given the patient's breast
density, if this will alter clinical management.

I have discussed the findings and recommendations with the patient.
Results were also provided in writing at the conclusion of the
visit. If applicable, a reminder letter will be sent to the patient
regarding the next appointment.

BI-RADS CATEGORY  5: Highly suggestive of malignancy.

## 2019-11-05 ENCOUNTER — Encounter: Payer: Self-pay | Admitting: Obstetrics and Gynecology

## 2019-11-06 ENCOUNTER — Telehealth: Payer: Self-pay | Admitting: Obstetrics and Gynecology

## 2019-11-06 NOTE — Telephone Encounter (Signed)
Patient is schedule for Monday, 11/10/19 for paraguard placement with Alicia Copland. Patient is also scheduled for annual on 12/05/19 with ABC

## 2019-11-06 NOTE — Progress Notes (Signed)
   Chief Complaint  Patient presents with  . Contraception    Paragard insertion     IUD PROCEDURE NOTE:  Denise [name] is a 44 y.o. [phone] here for Paragard  IUD insertion for [facility]. Pt with DCIS and treatment with tamoxifen. Menses irreg since chemo/tamoxifen tx but would like BC to prevent pregnancy. Menses started last wk, mod to heavy flow, slowing down now. Minimal dysmen. Prior menses was 6/20. No AUB. Can't have hormones.    BP 128/80   Ht 5\' 7"  (1.702 m)   Wt 164 lb (74.4 kg)   LMP 11/05/2019 (Exact Date)   BMI 25.69 kg/m   IUD Insertion Procedure Note Patient identified, informed consent performed, consent signed.   Discussed risks of irregular bleeding, cramping, infection, malpositioning or misplacement of the IUD outside the uterus which may require further procedure such as laparoscopy, risk of failure <1%. Time out was performed.    Speculum placed in the vagina.  Cervix visualized.  Cleaned with Betadine x 2.  Grasped anteriorly with a single tooth tenaculum.  Uterus sounded to 9.0 cm.   IUD placed per manufacturer's recommendations.  Strings trimmed to 3 cm. Tenaculum was removed, good hemostasis noted.  Patient tolerated procedure well.   ASSESSMENT:  Encounter for IUD insertion - Plan: paragard intrauterine copper IUD   Meds ordered this encounter  Medications  . paragard intrauterine copper IUD     Plan:  Patient was given post-procedure instructions.  She was advised to have backup contraception for one week.   Call if you are having increasing pain, cramps or bleeding or if you have a fever greater than 100.4 degrees F., shaking chills, nausea or vomiting. Patient was also asked to check IUD strings periodically and follow up in 4 weeks for IUD check.  Return in about 4 weeks (around 12/08/2019) for annual, IUD f/u.  Denise [name] B. Alakai Macbride, PA-C 11/10/2019 10:07 AM

## 2019-11-06 NOTE — Telephone Encounter (Signed)
Noted. Paragard reserved for this patient. 

## 2019-11-10 ENCOUNTER — Encounter: Payer: Self-pay | Admitting: Obstetrics and Gynecology

## 2019-11-10 ENCOUNTER — Ambulatory Visit (INDEPENDENT_AMBULATORY_CARE_PROVIDER_SITE_OTHER): Payer: BC Managed Care – PPO | Admitting: Obstetrics and Gynecology

## 2019-11-10 ENCOUNTER — Other Ambulatory Visit: Payer: Self-pay

## 2019-11-10 VITALS — BP 128/80 | Ht 67.0 in | Wt 164.0 lb

## 2019-11-10 DIAGNOSIS — Z30430 Encounter for insertion of intrauterine contraceptive device: Secondary | ICD-10-CM

## 2019-11-10 MED ORDER — PARAGARD INTRAUTERINE COPPER IU IUD: INTRAUTERINE_SYSTEM | Freq: Once | INTRAUTERINE | Status: AC

## 2019-11-10 NOTE — Patient Instructions (Signed)
I value your feedback and entrusting us with your care. If you get a [facility] patient survey, I would appreciate you taking the time to let us know about your experience today. Thank you!  As of May 29, 2019, your lab results will be released to your MyChart immediately, before I even have a chance to see them. Please give me time to review them and contact you if there are any abnormalities. Thank you for your patience.   [facility] [phone]  Instructions after IUD insertion  Most women experience no significant problems after insertion of an IUD, however minor cramping and spotting for a few days is common. Cramps may be treated with ibuprofen 800mg every 8 hours or Tylenol 650 mg every 4 hours. Contact [facility] immediately if you experience any of the following symptoms during the next week: temperature >99.6 degrees, worsening pelvic pain, abdominal pain, fainting, unusually heavy vaginal bleeding, foul vaginal discharge, or if you think you have expelled the IUD.  Nothing inserted in the vagina for 48 hours. You will be scheduled for a follow up visit in approximately four weeks.  You should check monthly to be sure you can feel the IUD strings in the upper vagina. If you are having a monthly period, try to check after each period. If you cannot feel the IUD strings,  contact [facility] immediately so we can do an exam to determine if the IUD has been expelled.   Please use backup protection until we can confirm the IUD is in place.  Call [facility] if you are exposed to or diagnosed with a sexually transmitted infection, as we will need to discuss whether it is safe for you to continue using an IUD.   

## 2019-11-19 ENCOUNTER — Other Ambulatory Visit: Payer: Self-pay | Admitting: Hematology and Oncology

## 2019-11-19 DIAGNOSIS — C50419 Malignant neoplasm of upper-outer quadrant of unspecified female breast: Secondary | ICD-10-CM

## 2019-11-19 MED ORDER — TAMOXIFEN CITRATE 20 MG PO TABS: 20.0000 mg | ORAL_TABLET | Freq: Every day | ORAL | 0 refills | Status: DC

## 2019-11-19 NOTE — Telephone Encounter (Signed)
Dr. Corcoran pt 

## 2019-12-01 ENCOUNTER — Ambulatory Visit: Payer: BC Managed Care – PPO

## 2019-12-04 ENCOUNTER — Inpatient Hospital Stay: Payer: BC Managed Care – PPO | Attending: Hematology and Oncology | Admitting: Hematology and Oncology

## 2019-12-04 ENCOUNTER — Other Ambulatory Visit: Payer: Self-pay

## 2019-12-04 ENCOUNTER — Inpatient Hospital Stay: Payer: BC Managed Care – PPO

## 2019-12-04 ENCOUNTER — Encounter: Payer: Self-pay | Admitting: Hematology and Oncology

## 2019-12-04 VITALS — BP 127/81 | HR 67 | Temp 98.4°F | Wt 162.4 lb

## 2019-12-04 DIAGNOSIS — D509 Iron deficiency anemia, unspecified: Secondary | ICD-10-CM | POA: Insufficient documentation

## 2019-12-04 DIAGNOSIS — Z79810 Long term (current) use of selective estrogen receptor modulators (SERMs): Secondary | ICD-10-CM | POA: Insufficient documentation

## 2019-12-04 DIAGNOSIS — C50419 Malignant neoplasm of upper-outer quadrant of unspecified female breast: Secondary | ICD-10-CM

## 2019-12-04 DIAGNOSIS — Z9012 Acquired absence of left breast and nipple: Secondary | ICD-10-CM | POA: Diagnosis not present

## 2019-12-04 DIAGNOSIS — Z9221 Personal history of antineoplastic chemotherapy: Secondary | ICD-10-CM | POA: Diagnosis not present

## 2019-12-04 DIAGNOSIS — C50512 Malignant neoplasm of lower-outer quadrant of left female breast: Secondary | ICD-10-CM | POA: Diagnosis present

## 2019-12-04 DIAGNOSIS — Z170 Estrogen receptor positive status [ER+]: Secondary | ICD-10-CM | POA: Diagnosis not present

## 2019-12-04 DIAGNOSIS — Z923 Personal history of irradiation: Secondary | ICD-10-CM | POA: Insufficient documentation

## 2019-12-04 DIAGNOSIS — Z800 Family history of malignant neoplasm of digestive organs: Secondary | ICD-10-CM | POA: Diagnosis not present

## 2019-12-04 DIAGNOSIS — Z79899 Other long term (current) drug therapy: Secondary | ICD-10-CM | POA: Insufficient documentation

## 2019-12-04 DIAGNOSIS — Z803 Family history of malignant neoplasm of breast: Secondary | ICD-10-CM | POA: Insufficient documentation

## 2019-12-04 DIAGNOSIS — Z8042 Family history of malignant neoplasm of prostate: Secondary | ICD-10-CM | POA: Insufficient documentation

## 2019-12-04 DIAGNOSIS — C773 Secondary and unspecified malignant neoplasm of axilla and upper limb lymph nodes: Secondary | ICD-10-CM | POA: Insufficient documentation

## 2019-12-04 DIAGNOSIS — D500 Iron deficiency anemia secondary to blood loss (chronic): Secondary | ICD-10-CM

## 2019-12-04 DIAGNOSIS — Z86018 Personal history of other benign neoplasm: Secondary | ICD-10-CM | POA: Insufficient documentation

## 2019-12-04 LAB — CBC WITH DIFFERENTIAL/PLATELET
Abs Immature Granulocytes: 0.01 10*3/uL (ref 0.00–0.07)
Basophils Absolute: 0 10*3/uL (ref 0.0–0.1)
Basophils Relative: 0 %
Eosinophils Absolute: 0.1 10*3/uL (ref 0.0–0.5)
Eosinophils Relative: 2 %
HCT: 37.3 % (ref 36.0–46.0)
Hemoglobin: 12.4 g/dL (ref 12.0–15.0)
Immature Granulocytes: 0 %
Lymphocytes Relative: 32 %
Lymphs Abs: 1.8 10*3/uL (ref 0.7–4.0)
MCH: 28.7 pg (ref 26.0–34.0)
MCHC: 33.2 g/dL (ref 30.0–36.0)
MCV: 86.3 fL (ref 80.0–100.0)
Monocytes Absolute: 0.4 10*3/uL (ref 0.1–1.0)
Monocytes Relative: 7 %
Neutro Abs: 3.2 10*3/uL (ref 1.7–7.7)
Neutrophils Relative %: 59 %
Platelets: 211 10*3/uL (ref 150–400)
RBC: 4.32 MIL/uL (ref 3.87–5.11)
RDW: 13.2 % (ref 11.5–15.5)
WBC: 5.5 10*3/uL (ref 4.0–10.5)
nRBC: 0 % (ref 0.0–0.2)

## 2019-12-04 LAB — COMPREHENSIVE METABOLIC PANEL
ALT: 19 U/L (ref 0–44)
AST: 21 U/L (ref 15–41)
Albumin: 4.3 g/dL (ref 3.5–5.0)
Alkaline Phosphatase: 50 U/L (ref 38–126)
Anion gap: 7 (ref 5–15)
BUN: 14 mg/dL (ref 6–20)
CO2: 26 mmol/L (ref 22–32)
Calcium: 8.8 mg/dL — ABNORMAL LOW (ref 8.9–10.3)
Chloride: 104 mmol/L (ref 98–111)
Creatinine, Ser: 0.79 mg/dL (ref 0.44–1.00)
GFR calc Af Amer: >60 mL/min (ref >60)
GFR calc non Af Amer: >60 mL/min (ref >60)
Glucose, Bld: 84 mg/dL (ref 70–99)
Potassium: 3.9 mmol/L (ref 3.5–5.1)
Sodium: 137 mmol/L (ref 135–145)
Total Bilirubin: 0.7 mg/dL (ref 0.3–1.2)
Total Protein: 7.2 g/dL (ref 6.5–8.1)

## 2019-12-04 NOTE — Progress Notes (Signed)
PCP:  [name], PA-C   Chief Complaint  Patient presents with  . Gynecologic Exam    HPI:      Ms. Denise [name] is a 44 y.o. G2P2 who LMP was Patient's last menstrual period was 11/05/2019 (exact date)., presents today for her annual examination.  Her menses are irregular with tamoxifen/chemo use, lasting 6-7 days, mod to heavy flow. Had a period 5/21, first one in a year. No DUB sx. Has occas vasomotor sx.  Sex activity: single partner, contraception - Paragard placed 11/10/19, due for IUD f/u today. Pt wanted [facility] protection even though conception unlikely on tamoxifen. Doing well, no complaints. Last Pap: 12/04/18  Results were: no abnormalities /neg HPV DNA 2017  Hx of STDs: none  Last mammogram: 08/21/18 RT breast.  Results were: normal--routine follow-up in 12 months. Pt will no longer need mammos, just CBE. 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 Now doing tamoxifen. 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  Had RT breast prophylactic mastectomy with reconstruction 12/09/18.  There is a FH of breast cancer in her mat aunt, pat aunt and PGM. There is a FH of ovarian cancer in her mat aunt, and now pancreatic cancer in a 2rd mat aunt. Pt's father and pat uncles diagnosed with prostate cancer, requiring treatment. 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 CHEK2 is increased risk of breast and colon cancer. Pt states her mom doesn't have genetic mutation after testing. Her father was tested but they don't have results. No FH colon cancer.  Colonoscopy: Recommendations are to start at age 53 since no FH. Has discussed with [name] who has stated she didn't need to do it just yet. Pt not interested in colonoscopy ref at this time. Suggested she discuss again with [name] at her next visit.   Tobacco use: The patient denies current or previous tobacco use. Alcohol use:  rare No drug use.  Exercise: min active  She does get adequate calcium but not Vitamin D in her diet. Labs with PCP  Past Medical History:  Diagnosis Date  . Abnormal breast biopsy    PASH, [name]  . Breast cancer ([place]) 11/2017   left breast; ER/PR/Her2neu POS  . Family history of breast cancer   . Family history of ovarian cancer   . Fibroadenoma of breast, left 2000, 2016  . Genetic screening 08/26/2013   BRCA/BART negative/Myriad, CHEK2 POS 2017  . Increased risk of breast cancer 2017   IBIS=47%  . Left breast lump 06/18/2017   Fluid/drained [name]  . Migraine   . Monoallelic mutation of CHEK2 gene in female patient 2017   increased risk of breast and colon cancer  . Personal history of chemotherapy   . Personal history of radiation therapy     Past Surgical History:  Procedure Laterality Date  . BREAST BIOPSY Bilateral 09/08/14   neg  . BREAST BIOPSY Left 03/16/2015   Procedure: BREAST BIOPSY WITH NEEDLE LOCALIZATION;  Surgeon: [name], MD;  Location: [facility];  Service: General;  Laterality: Left;  . BREAST BIOPSY Left 11/2017   positive  . BREAST CYST ASPIRATION Left 06/18/2017   cyst aspiration  . BREAST EXCISIONAL BIOPSY Left 09/2014  . BREAST EXCISIONAL BIOPSY Right    age 89's  . BREAST RECONSTRUCTION WITH PLACEMENT OF TISSUE EXPANDER AND FLEX HD (ACELLULAR HYDRATED DERMIS) Left  05/20/2018   Procedure: BREAST RECONSTRUCTION WITH PLACEMENT OF TISSUE EXPANDER AND FLEX HD (ACELLULAR HYDRATED DERMIS);  Surgeon: [name], DO;  Location: [facility];  Service: Plastics;  Laterality: Left;  . BREAST RECONSTRUCTION WITH PLACEMENT OF TISSUE EXPANDER AND FLEX HD (ACELLULAR HYDRATED DERMIS) Right 12/09/2018   Procedure: BREAST RECONSTRUCTION WITH PLACEMENT OF TISSUE EXPANDER AND FLEX HD (ACELLULAR HYDRATED DERMIS);  Surgeon: [name], DO;  Location: [facility];  Service: Plastics;  Laterality: Right;  . BREAST SURGERY Right March 2000    benign fibroadenoma  . BREAST SURGERY Left 08/08/13   excision  . BREAST SURGERY Left 09/23/14   excision  . ESOPHAGOGASTRODUODENOSCOPY (EGD) WITH PROPOFOL N/A 01/20/2019   Procedure: ESOPHAGOGASTRODUODENOSCOPY (EGD) WITH PROPOFOL;  Surgeon: [name], MD;  Location: [facility];  Service: Gastroenterology;  Laterality: N/A;  . MASTECTOMY Left 05/20/2018  . MASTECTOMY W/ SENTINEL NODE BIOPSY Left 05/20/2018   Procedure: MASTECTOMY WITH SENTINEL LYMPH NODE BIOPSY;  Surgeon: [name], MD;  Location: [facility];  Service: General;  Laterality: Left;  . PORT-A-CATH REMOVAL Right 03/31/2019   Procedure: REMOVAL PORT-A-CATH;  Surgeon: [name], DO;  Location: [facility];  Service: Plastics;  Laterality: Right;  . PORTACATH PLACEMENT Right 12/17/2017   Procedure: INSERTION PORT-A-CATH;  Surgeon: [name], MD;  Location: [facility];  Service: General;  Laterality: Right;  . REMOVAL OF BILATERAL TISSUE EXPANDERS WITH PLACEMENT OF BILATERAL BREAST IMPLANTS Bilateral 03/31/2019   Procedure: REMOVAL OF BILATERAL TISSUE EXPANDERS WITH PLACEMENT OF BILATERAL BREAST IMPLANTS;  Surgeon: [name], DO;  Location: [facility];  Service: Plastics;  Laterality: Bilateral;  2 hours  . SIMPLE MASTECTOMY WITH AXILLARY SENTINEL NODE BIOPSY Right 12/09/2018   Procedure: SIMPLE MASTECTOMY RIGHT;  Surgeon: [name], MD;  Location: [facility];  Service: General;  Laterality: Right;    Family History  Problem Relation Age of Onset  . Prostate cancer Father 40       requiring tx/hormones  . Breast cancer Paternal Grandmother 30  . Breast cancer Maternal Aunt 60  . Ovarian cancer Maternal Aunt 70  . Breast cancer Paternal Aunt 18  . Pancreatic cancer Maternal Aunt 75    Social History   Socioeconomic History  . Marital status: Married    Spouse name: Not on file  . Number of children: Not on file  . Years of education: Not on file  . Highest education level: Not  on file  Occupational History  . Not on file  Tobacco Use  . Smoking status: Never Smoker  . Smokeless tobacco: Never Used  Vaping Use  . Vaping Use: Never used  Substance and Sexual Activity  . Alcohol use: Yes    Comment: occasionally  . Drug use: No  . Sexual activity: Yes    Birth control/protection: I.U.D.    Comment: Paragard  Other Topics Concern  . Not on file  Social History Narrative  . Not on file   Social Determinants of Health   Financial Resource Strain: Low Risk   . Difficulty of Paying Living Expenses: Not very hard  Food Insecurity: No Food Insecurity  . Worried About Charity fundraiser in the Last Year: Never true  . Ran Out of Food in the Last Year: Never true  Transportation Needs: Unknown  . Lack of Transportation (Medical): No  . Lack of Transportation (Non-Medical): Not on file  Physical Activity:   . Days of Exercise per Week:   .  Minutes of Exercise per Session:   Stress:   . Feeling of Stress :   Social Connections:   . Frequency of Communication with Friends and Family:   . Frequency of Social Gatherings with Friends and Family:   . Attends Religious Services:   . Active Member of Clubs or Organizations:   . Attends Archivist Meetings:   Marland Kitchen Marital Status:   Intimate Partner Violence:   . Fear of Current or Ex-Partner:   . Emotionally Abused:   Marland Kitchen Physically Abused:   . Sexually Abused:     Current Meds  Medication Sig  . acetaminophen (TYLENOL) 500 MG tablet Take 1,000 mg by mouth every 6 (six) hours as needed for moderate pain or headache.   . loratadine (CLARITIN) 10 MG tablet Take 10 mg by mouth daily.  Marland Kitchen omeprazole (PRILOSEC OTC) 20 MG tablet Take 20 mg by mouth daily as needed (for acid reflux).  . tamoxifen (NOLVADEX) 20 MG tablet Take 1 tablet (20 mg total) by mouth daily.   Current Facility-Administered Medications for the 12/05/19 encounter (Office Visit) with , Deirdre Evener, PA-C  Medication  . paragard  intrauterine copper IUD     ROS:  Review of Systems  Constitutional: Negative for fatigue, fever and unexpected weight change.  Respiratory: Negative for cough, shortness of breath and wheezing.   Cardiovascular: Negative for chest pain, palpitations and leg swelling.  Gastrointestinal: Negative for blood in stool, constipation, diarrhea, nausea and vomiting.  Endocrine: Negative for cold intolerance, heat intolerance and polyuria.  Genitourinary: Negative for dyspareunia, dysuria, flank pain, frequency, genital sores, hematuria, menstrual problem, pelvic pain, urgency, vaginal bleeding, vaginal discharge and vaginal pain.  Musculoskeletal: Negative for back pain, joint swelling and myalgias.  Skin: Negative for rash.  Neurological: Negative for dizziness, syncope, light-headedness, numbness and headaches.  Hematological: Negative for adenopathy.  Psychiatric/Behavioral: Negative for agitation, confusion, sleep disturbance and suicidal ideas. The patient is not nervous/anxious.      Objective: BP 124/70   Ht 5' 7" (1.702 m)   Wt 161 lb (73 kg)   LMP 11/05/2019 (Exact Date)   BMI 25.22 kg/m    Physical Exam Constitutional:      Appearance: She is well-developed.  Genitourinary:     Vulva, vagina, cervix, uterus, right adnexa and left adnexa normal.     No vulval lesion or tenderness noted.     No vaginal discharge, erythema or tenderness.     No cervical polyp.     IUD strings visualized.     Uterus is not enlarged or tender.     No right or left adnexal mass present.     Right adnexa not tender.     Left adnexa not tender.  Neck:     Thyroid: No thyromegaly.  Cardiovascular:     Rate and Rhythm: Normal rate and regular rhythm.     Heart sounds: Normal heart sounds. No murmur heard.   Pulmonary:     Effort: Pulmonary effort is normal.     Breath sounds: Normal breath sounds.  Chest:     Breasts:        Right: Normal. No mass, nipple discharge, skin change or  tenderness.        Left: Normal. No mass, nipple discharge, skin change or tenderness.     Comments: BILAT MASTECTOMY SCARS Abdominal:     Palpations: Abdomen is soft.     Tenderness: There is no abdominal tenderness. There is no guarding.  Musculoskeletal:  General: Normal range of motion.     Cervical back: Normal range of motion.  Neurological:     General: No focal deficit present.     Mental Status: She is alert and oriented to person, place, and time.     Cranial Nerves: No cranial nerve deficit.  Skin:    General: Skin is warm and dry.  Psychiatric:        Mood and Affect: Mood normal.        Behavior: Behavior normal.        Thought Content: Thought content normal.        Judgment: Judgment normal.  Vitals reviewed.     Assessment/Plan:  Encounter for annual routine gynecological examination  Encounter for routine checking of intrauterine contraceptive device (IUD); Paragard strings in cx os. Good till 5/31  Use of tamoxifen (Nolvadex)  Encounter for screening for malignant neoplasm of breast, unspecified screening modality; neg CBE today.  Malignant neoplasm of upper-outer quadrant of female breast, unspecified estrogen receptor status, unspecified laterality ([place])  Monoallelic mutation of CHEK2 gene in female patient  Screening for colon cancer--discussed GI ref for scr colonoscopy due to CHEK2. Pt declines for now and will f/u with [name].   GYN counsel breast self exam, mammography screening, adequate intake of calcium and vitamin D, diet and exercise     F/U  Return in about 1 year (around 12/04/2020).  Kaitlen Redford B. Fritzie Prioleau, PA-C 12/05/2019 2:28 PM

## 2019-12-04 NOTE — Progress Notes (Signed)
[facility]  [address] [place], [place] [address] Phone: [phone]  Fax: [phone]   Clinic Day:  12/04/2019  Referring physician: [name],*  Chief Complaint: Denise [name] is a 44 y.o. female with multi-focal Her2/neu + left breast cancer who is seen for a 4 month assessment.   HPI: The patient was last seen in the medical oncology clinic on 08/13/2019 via telemedicine. At that time, she was doing well. She denied any concerns. Labs were normal. She remained on tamoxifen.   Patient had a follow up with [name] on 08/19/2019. The implants were felt stable with no sign of issues. The skin was well healed. There was no sign of seroma. The left breast was slightly firm as a result of the radiation. She had excellent range of motion. Evaluation of the silicone implants every 3 years with ultrasound was recommended.   She underwent paragard IUD insertion with Ardeth Perfect, PA-C on 11/10/2019. Following up 06/118/2021.  During the interim, she has been "pretty good." Recently, she has starting experiencing occasional hot flashes. She has been intentionally losing weight using Noom, which she is enjoying. She tracks her food intake, weighs herself daily, and takes 10,000 steps per day. She had her first period since 11/2018 on 11/05/2019. The patient's thin nails have been improving and she currently has fake nails. She denies neuropathy, shortness of breath, chest pain, leg swelling, and orthopnea.  The patient is planning to get nipple tattoos on 03/15/2020. The patient received the [place] COVID-19 vaccines on 08/24/2019 and 09/16/2019.    Past Medical History:  Diagnosis Date  . Abnormal breast biopsy    [facility], [name]  . Breast cancer ([place]) 11/2017   left breast; ER/PR/Her2neu POS  . Family history of breast cancer   . Family history of ovarian cancer   . Fibroadenoma of breast, left 2000, 2016  . Genetic  screening 08/26/2013   BRCA/BART negative/Myriad, CHEK2 POS 2017  . Increased risk of breast cancer 2017   IBIS=47%  . Left breast lump 06/18/2017   Fluid/drained [name]  . Migraine   . Monoallelic mutation of CHEK2 gene in female patient 2017   increased risk of breast and colon cancer  . Personal history of chemotherapy   . Personal history of radiation therapy     Past Surgical History:  Procedure Laterality Date  . BREAST BIOPSY Bilateral 09/08/14   neg  . BREAST BIOPSY Left 03/16/2015   Procedure: BREAST BIOPSY WITH NEEDLE LOCALIZATION;  Surgeon: [name], MD;  Location: [facility];  Service: General;  Laterality: Left;  . BREAST BIOPSY Left 11/2017   positive  . BREAST CYST ASPIRATION Left 06/18/2017   cyst aspiration  . BREAST EXCISIONAL BIOPSY Left 09/2014  . BREAST EXCISIONAL BIOPSY Right    age 57's  . BREAST RECONSTRUCTION WITH PLACEMENT OF TISSUE EXPANDER AND FLEX HD (ACELLULAR HYDRATED DERMIS) Left 05/20/2018   Procedure: BREAST RECONSTRUCTION WITH PLACEMENT OF TISSUE EXPANDER AND FLEX HD (ACELLULAR HYDRATED DERMIS);  Surgeon: [name], DO;  Location: [facility];  Service: Plastics;  Laterality: Left;  . BREAST RECONSTRUCTION WITH PLACEMENT OF TISSUE EXPANDER AND FLEX HD (ACELLULAR HYDRATED DERMIS) Right 12/09/2018   Procedure: BREAST RECONSTRUCTION WITH PLACEMENT OF TISSUE EXPANDER AND FLEX HD (ACELLULAR HYDRATED DERMIS);  Surgeon: [name], DO;  Location: [facility];  Service: Plastics;  Laterality: Right;  . BREAST SURGERY Right March 2000   benign fibroadenoma  . BREAST SURGERY  Left 08/08/13   excision  . BREAST SURGERY Left 09/23/14   excision  . ESOPHAGOGASTRODUODENOSCOPY (EGD) WITH PROPOFOL N/A 01/20/2019   Procedure: ESOPHAGOGASTRODUODENOSCOPY (EGD) WITH PROPOFOL;  Surgeon: [name], MD;  Location: [place];  Service: Gastroenterology;  Laterality: N/A;  . MASTECTOMY Left 05/20/2018  . MASTECTOMY W/ SENTINEL NODE BIOPSY  Left 05/20/2018   Procedure: MASTECTOMY WITH SENTINEL LYMPH NODE BIOPSY;  Surgeon: [name], MD;  Location: [facility];  Service: General;  Laterality: Left;  . PORT-A-CATH REMOVAL Right 03/31/2019   Procedure: REMOVAL PORT-A-CATH;  Surgeon: [name], DO;  Location: [facility];  Service: Plastics;  Laterality: Right;  . PORTACATH PLACEMENT Right 12/17/2017   Procedure: INSERTION PORT-A-CATH;  Surgeon: [name], MD;  Location: [facility];  Service: General;  Laterality: Right;  . REMOVAL OF BILATERAL TISSUE EXPANDERS WITH PLACEMENT OF BILATERAL BREAST IMPLANTS Bilateral 03/31/2019   Procedure: REMOVAL OF BILATERAL TISSUE EXPANDERS WITH PLACEMENT OF BILATERAL BREAST IMPLANTS;  Surgeon: [name], DO;  Location: [facility];  Service: Plastics;  Laterality: Bilateral;  2 hours  . SIMPLE MASTECTOMY WITH AXILLARY SENTINEL NODE BIOPSY Right 12/09/2018   Procedure: SIMPLE MASTECTOMY RIGHT;  Surgeon: [name], MD;  Location: [facility];  Service: General;  Laterality: Right;    Family History  Problem Relation Age of Onset  . Prostate cancer Father 71       requiring tx/hormones  . Breast cancer Paternal Grandmother 73  . Breast cancer Maternal Aunt 60  . Ovarian cancer Maternal Aunt 70  . Breast cancer Paternal Aunt 22  . Pancreatic cancer Maternal Aunt 75    Social History:  reports that she has never smoked. She has never used smokeless tobacco. She reports current alcohol use. She reports that she does not use drugs. She "occasionally" drinks alcohol. 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 During COVID-19 she is working remotely at home. Patient denies known exposures to radiation on toxins. Her husband's name is [name]. She has 2 daughters, [name] and [name] (ages 12 1/2 and 15). Her husband's name is [name]. The patient is alone today.   Allergies:  Allergies  Allergen Reactions  . Steri-Strip Compound Benzoin [Benzoin Compound]      Steri-strip ,   . Tape Other (See Comments)    Minor skin irritation     Current Medications: Current Outpatient Medications  Medication Sig Dispense Refill  . acetaminophen (TYLENOL) 500 MG tablet Take 1,000 mg by mouth every 6 (six) hours as needed for moderate pain or headache.     . loratadine (CLARITIN) 10 MG tablet Take 10 mg by mouth daily.    Marland Kitchen omeprazole (PRILOSEC OTC) 20 MG tablet Take 20 mg by mouth daily as needed (for acid reflux).    . tamoxifen (NOLVADEX) 20 MG tablet Take 1 tablet (20 mg total) by mouth daily. 90 tablet 0  . triamcinolone (KENALOG) 0.025 % ointment Apply 1 application topically 2 (two) times daily. (Patient not taking: Reported on 11/10/2019) 30 g 0   Current Facility-Administered Medications  Medication Dose Route Frequency Provider Last Rate Last Admin  . paragard intrauterine copper IUD   Intrauterine Once Copland, Alicia B, PA-C       Facility-Administered Medications Ordered in Other Visits  Medication Dose Route Frequency Provider Last Rate Last Admin  . sodium chloride flush (NS) 0.9 % injection 10 mL  10 mL Intravenous PRN Lequita Asal, MD   10 mL at 04/17/18  0844    Review of Systems  Constitutional: Positive for weight loss (23 lbs since 05/30/2019; Goal weight: 150). Negative for chills, diaphoresis, fever and malaise/fatigue.       Feels pretty good.  HENT: Negative.  Negative for congestion, ear pain, hearing loss, nosebleeds, sinus pain, sore throat and tinnitus.   Eyes: Negative.  Negative for blurred vision, double vision and photophobia.  Respiratory: Negative.  Negative for cough, hemoptysis, sputum production and shortness of breath.   Cardiovascular: Negative.  Negative for chest pain, palpitations, orthopnea, leg swelling and PND.  Gastrointestinal: Negative.  Negative for abdominal pain, blood in stool, constipation, diarrhea, heartburn, melena, nausea and vomiting.       Normal bowels.  Genitourinary: Negative.   Negative for frequency, hematuria and urgency.  Musculoskeletal: Negative.  Negative for back pain, falls, joint pain, myalgias and neck pain.  Skin: Negative.  Negative for itching and rash.       Finger/toe nails very thin s/p chemotherapy, improving. Pt has fake nails.  Neurological: Negative.  Negative for dizziness, tremors, sensory change, speech change, focal weakness, weakness and headaches.  Endo/Heme/Allergies: Does not bruise/bleed easily.       Occasional hot flashes.  Psychiatric/Behavioral: Negative.  Negative for depression and memory loss. The patient is not nervous/anxious and does not have insomnia.   All other systems reviewed and are negative.  Performance status (ECOG): 0  Vitals: Blood pressure 127/81, pulse 67, temperature 98.4 F (36.9 C), temperature source Tympanic, weight 162 lb 5.9 oz (73.6 kg), last menstrual period 11/05/2019, SpO2 100 %.  Physical Exam Vitals and nursing note reviewed.  Constitutional:      General: She is not in acute distress.    Appearance: She is well-developed. She is not diaphoretic.     Interventions: Face mask in place.  HENT:     Head: Normocephalic and atraumatic.     Comments: Brown hair    Mouth/Throat:     Mouth: Mucous membranes are moist.     Pharynx: Oropharynx is clear. No oropharyngeal exudate.  Eyes:     General: No scleral icterus.    Extraocular Movements: Extraocular movements intact.     Conjunctiva/sclera: Conjunctivae normal.     Pupils: Pupils are equal, round, and reactive to light.     Comments: Glasses.  Cardiovascular:     Rate and Rhythm: Normal rate and regular rhythm.     Pulses: Normal pulses.     Heart sounds: Normal heart sounds. No murmur heard.   Pulmonary:     Effort: Pulmonary effort is normal. No respiratory distress.     Breath sounds: Normal breath sounds. No wheezing or rales.  Chest:     Chest wall: No tenderness.     Comments: Bilateral mastectomy and reconstruction.  No erythema  or nodularity. Abdominal:     General: Bowel sounds are normal. There is no distension.     Palpations: Abdomen is soft. There is no mass.     Tenderness: There is no abdominal tenderness. There is no guarding.  Musculoskeletal:        General: No swelling or tenderness. Normal range of motion.     Cervical back: Normal range of motion and neck supple.  Lymphadenopathy:     Head:     Right side of head: No preauricular, posterior auricular or occipital adenopathy.     Left side of head: No preauricular, posterior auricular or occipital adenopathy.     Cervical: No cervical adenopathy.  Upper Body:     Right upper body: No supraclavicular adenopathy.     Left upper body: No supraclavicular adenopathy.     Lower Body: No right inguinal adenopathy. No left inguinal adenopathy.  Skin:    General: Skin is warm and dry.  Neurological:     Mental Status: She is alert and oriented to person, place, and time. Mental status is at baseline.  Psychiatric:        Mood and Affect: Mood normal.        Behavior: Behavior normal.        Thought Content: Thought content normal.        Judgment: Judgment normal.     Appointment on 12/04/2019  Component Date Value Ref Range Status  . Sodium 12/04/2019 137  135 - 145 mmol/L Final  . Potassium 12/04/2019 3.9  3.5 - 5.1 mmol/L Final  . Chloride 12/04/2019 104  98 - 111 mmol/L Final  . CO2 12/04/2019 26  22 - 32 mmol/L Final  . Glucose, Bld 12/04/2019 84  70 - 99 mg/dL Final   Glucose reference range applies only to samples taken after fasting for at least 8 hours.  . BUN 12/04/2019 14  6 - 20 mg/dL Final  . Creatinine, Ser 12/04/2019 0.79  0.44 - 1.00 mg/dL Final  . Calcium 12/04/2019 8.8* 8.9 - 10.3 mg/dL Final  . Total Protein 12/04/2019 7.2  6.5 - 8.1 g/dL Final  . Albumin 12/04/2019 4.3  3.5 - 5.0 g/dL Final  . AST 12/04/2019 21  15 - 41 U/L Final  . ALT 12/04/2019 19  0 - 44 U/L Final  . Alkaline Phosphatase 12/04/2019 50  38 - 126 U/L  Final  . Total Bilirubin 12/04/2019 0.7  0.3 - 1.2 mg/dL Final  . GFR calc non Af Amer 12/04/2019 >60  >60 mL/min Final  . GFR calc Af Amer 12/04/2019 >60  >60 mL/min Final  . Anion gap 12/04/2019 7  5 - 15 Final   Performed at [facility] [place], [address] [place], [place] [address]  . WBC 12/04/2019 5.5  4.0 - 10.5 K/uL Final  . RBC 12/04/2019 4.32  3.87 - 5.11 MIL/uL Final  . Hemoglobin 12/04/2019 12.4  12.0 - 15.0 g/dL Final  . HCT 12/04/2019 37.3  36 - 46 % Final  . MCV 12/04/2019 86.3  80.0 - 100.0 fL Final  . MCH 12/04/2019 28.7  26.0 - 34.0 pg Final  . MCHC 12/04/2019 33.2  30.0 - 36.0 g/dL Final  . RDW 12/04/2019 13.2  11.5 - 15.5 % Final  . Platelets 12/04/2019 211  150 - 400 K/uL Final  . nRBC 12/04/2019 0.0  0.0 - 0.2 % Final  . Neutrophils Relative % 12/04/2019 59  % Final  . Neutro Abs 12/04/2019 3.2  1.7 - 7.7 K/uL Final  . Lymphocytes Relative 12/04/2019 32  % Final  . Lymphs Abs 12/04/2019 1.8  0.7 - 4.0 K/uL Final  . Monocytes Relative 12/04/2019 7  % Final  . Monocytes Absolute 12/04/2019 0.4  0 - 1 K/uL Final  . Eosinophils Relative 12/04/2019 2  % Final  . Eosinophils Absolute 12/04/2019 0.1  0 - 0 K/uL Final  . Basophils Relative 12/04/2019 0  % Final  . Basophils Absolute 12/04/2019 0.0  0 - 0 K/uL Final  . Immature Granulocytes 12/04/2019 0  % Final  . Abs Immature Granulocytes 12/04/2019 0.01  0.00 - 0.07 K/uL Final   Performed at [facility]  [facility] [address] [facility], [address], [place], [place] [address]    Assessment:  LEGEND PECORE is a 44 y.o. female with multi-focal stage IB Her2/neu + left breast cancers/p neoadjuvant chemotherapy followed by mastectomywith sentinel lymph node biopsy on 05/20/2018. Pathologyrevealed no residual carcinoma in the breast. One of three sentinel lymph nodes were positive. One lymph node had 2 metastatic foci (2.1 mm and 1 mm). Pathologic stagerevealed ypT0 ypN1a.  Index breast mass and axillary  nodebiopsyon 12/06/2017 revealed grade II invasive ductal carcinoma with calcifications at the 11 o'clock position. There was metastatic carcinoma in 1 of 1 lymph nodes. Tumor was ER + (100%), PR + (100%), and Ki67 5%. Her2/neu was 3+ by IHC (heterogeneous) and FISH +.  Diagnostic left mammogram and ultrasoundon 12/06/2017 revealed a 1.7 x 1.3 x 1.3 cm irregular hypoechoic mass with internal calcifications at the 11 o'clock position 2 cm from the nipple with internal calcifications. There was a similar appearing 0.9 x 0.8 x 0.7 cm mass at the 11 o'clock position 4 cm from the nipple (2.4 cm from the index lesion). There were suspicious, segmental calcifications originating from the index lesion and extending 5 cm posteriorly. There was a 0.6 cm morphologically abnormal left axillary lymph node.  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 The adjacent and more superiorly located 11 o'clock mass (7 x 12 mm) seen on recent ultrasound, 4 cm from the nipple on ultrasound, was also identified. There were 2 probable satellite lesions (7 mm, medial lesion; posterior and lateral lesion). The total span of malignancywas 3 x 3.1 x 4.5 cm. There were calcificationsextending up to 5 cm posterior to the biopsied malignancy which were highly suspicious on mammography but not appreciated. There was a mass in the lower outer left breast measured 5 mm, representing a change. The known metastatic nodein the left axilla was identified. A node along the posterolateral margin of the pectoralis minor (cortex 5 mm) was nonspecific but at least somewhat suspicious.  Chest, abdomen, and pelvic CTon 12/19/2017 revealed a solitary enlarged biopsy-proven metastatic left axillary lymph node. There were no additional findings suspicious for metastatic disease in the chest, abdomen or pelvis. There was a  nonspecific tiny sclerotic upper right sacral lesion, more likely a benign bone island. Bone scintigraphy correlation versus follow-up CT could be considered.  Bone scanon 01/11/2018 was negative for metastatic pattern. No areas of focal tracer uptake. Area of concern in sacrum on CT likely represents benign bone island.   Myriad genetic testingon 08/26/2013 was negative for BRCA1 and 2. CHEK2 mutationpositive (increased risk of breast and colon cancer). She has a family history significant for breast, ovarian, pancreatic, and prostate cancer.  She received 6 cycles of TCHP chemotherapy (12/21/2017 - 04/17/2018) with Margarette Canada support. Cycle #3 was held due to elevated LFTs on 02/04/2018. She received Herceptin + Perjeta alone (04/30/2018 - 05/30/2018). Shereceived8cycles of Kadcyla(06/20/2018 - 07/11/2018; 08/20/2018- 12/25/2018). Cycle #3 was held on 08/01/2018 due to visual changes (blurred vision).Cycle #8 was complicated by epistaxis requiring admission.She received Kanjintion 01/24/2019 (last 02/14/2019).  She received breast radiationfrom 06/26/2018 - 08/05/2018.She began tamoxifen on 10/01/2018.  Sheunderwentright prophylactic mastectomywithreconstructionon 12/09/2018. Pathologyon 12/10/2018 revealed benign breast tissue. Sentinellymphnode was negative.  She underwent bilateral exchange of tissue expanders for implants and bilateral capsulotomies on 03/31/2019.  CA27.29 has been followed: 24.0 on 12/12/2017, 21.9 on 05/09/2019, 19.9 on 08/12/2019, and 18.3  on 12/04/2019.  Echocardiograms: EF 60-65% on 12/20/2017, 60-65% on 03/27/2018, 55-60% on 06/18/2018, 55-60% on 09/27/2018, and 60-65% on 12/25/2018.  She has a history of elevated LFTs. Hepatitis B and C serologies were negative on 02/04/2018.RUQ ultrasound on 02/06/2018 revealed a small anterior gallbladder wall polyp. There was no evidence of cholelithiasis or cholecystis. CBD measured normal at 2  mm, with no evidence of choledocholithiasis. There was normal direction of blood flow towards the liver noted.  She has vasomotor symptoms. She discontinued Effexor around 09/18/2018. She was amenorrheic from 11/2017 - 10/15/2018. [place] and estradiol confirmed a premenopausalstate on 10/01/2018. Mensesrestarted on 10/15/2018.  She has iron deficiency.  She is off oral iron.  Ferritin was 21 on 02/14/2019 and 32 on 05/09/2019.  She was [facility] to [facility] from [facility]08/2019 - 01/09/2019 for persistent epistaxis.HeadCTrevealed minimal mucosal thickening of the right sphenoid sinus and was otherwise normal. She underwent right nare airpacking. She is on doxycycline 146m x10 days.Etiology wasfelt secondary tohigh blood pressure. She was discharged on metoprolol.  Patient received the [place]OVID-19 vaccine on 08/24/2019 and 09/16/2019.   Symptomatically, she is doing well.  She has occasional hot flashes.  Exam is unremarkable.  Plan: 1.   Labs today: CBC with diff, CMP, CA 27.29. 2.Stage IB multifocal LEFT breast cancer Clinically,  she continues to do well. She iss/pprophylactic mastectomy with reconstruction on 12/09/2018. She underwent implant exchange on 03/31/2019. Sheis s/p 52weeks of HER-2/neu directed therapy (last 02/14/2019). She began tamoxifen on 10/01/2018. Continue tamoxifen. 3.History of iron deficiency anemia  Hematocrit to 37.3.  Hemoglobin 12.4.  MCV 86.3 on 12/04/2019.             Ferritin was 32 on 05/09/2019. Continue to monitor.  4.   RTC in 4 months for MD assessment and labs (CBC with diff, CMP, CA 27.29).    I discussed the assessment and treatment plan with the patient.  The patient was provided an opportunity to ask questions and all were answered.  The patient agreed with the plan and demonstrated an understanding of the instructions.  The patient was advised to call back if the symptoms worsen or if the  condition fails to improve as anticipated.  I provided 19 minutes of face-to-face time during this encounter and > 50% was spent counseling as documented under my assessment and plan.   MLequita Asal MD, PhD    12/04/2019, 2:54 PM  I, EDe Burrs am acting as scribe for [employer] CMike Gip MD, PhD.  I, Ellisyn Icenhower C. CMike Gip MD, have reviewed the above documentation for accuracy and completeness, and I agree with the above.

## 2019-12-04 NOTE — Progress Notes (Signed)
Patient denies new problems/concerns today.   °

## 2019-12-05 ENCOUNTER — Encounter: Payer: Self-pay | Admitting: Obstetrics and Gynecology

## 2019-12-05 ENCOUNTER — Ambulatory Visit (INDEPENDENT_AMBULATORY_CARE_PROVIDER_SITE_OTHER): Payer: BC Managed Care – PPO | Admitting: Obstetrics and Gynecology

## 2019-12-05 VITALS — BP 124/70 | Ht 67.0 in | Wt 161.0 lb

## 2019-12-05 DIAGNOSIS — Z79810 Long term (current) use of selective estrogen receptor modulators (SERMs): Secondary | ICD-10-CM | POA: Diagnosis not present

## 2019-12-05 DIAGNOSIS — C50419 Malignant neoplasm of upper-outer quadrant of unspecified female breast: Secondary | ICD-10-CM

## 2019-12-05 DIAGNOSIS — Z30431 Encounter for routine checking of intrauterine contraceptive device: Secondary | ICD-10-CM

## 2019-12-05 DIAGNOSIS — Z1211 Encounter for screening for malignant neoplasm of colon: Secondary | ICD-10-CM

## 2019-12-05 DIAGNOSIS — Z01419 Encounter for gynecological examination (general) (routine) without abnormal findings: Secondary | ICD-10-CM

## 2019-12-05 DIAGNOSIS — Z1239 Encounter for other screening for malignant neoplasm of breast: Secondary | ICD-10-CM | POA: Diagnosis not present

## 2019-12-05 DIAGNOSIS — Z1502 Genetic susceptibility to malignant neoplasm of ovary: Secondary | ICD-10-CM

## 2019-12-05 DIAGNOSIS — Z1501 Genetic susceptibility to malignant neoplasm of breast: Secondary | ICD-10-CM

## 2019-12-05 DIAGNOSIS — Z1589 Genetic susceptibility to other disease: Secondary | ICD-10-CM

## 2019-12-05 DIAGNOSIS — Z1509 Genetic susceptibility to other malignant neoplasm: Secondary | ICD-10-CM

## 2019-12-05 LAB — CANCER ANTIGEN 27.29: CA 27.29: 18.3 U/mL (ref 0.0–38.6)

## 2019-12-05 NOTE — Patient Instructions (Signed)
I value your feedback and entrusting us with your care. If you get a [facility] patient survey, I would appreciate you taking the time to let us know about your experience today. Thank you!  As of May 29, 2019, your lab results will be released to your MyChart immediately, before I even have a chance to see them. Please give me time to review them and contact you if there are any abnormalities. Thank you for your patience.  

## 2019-12-15 ENCOUNTER — Ambulatory Visit: Payer: BC Managed Care – PPO

## 2020-02-14 ENCOUNTER — Encounter: Payer: Self-pay | Admitting: Hematology and Oncology

## 2020-02-16 ENCOUNTER — Other Ambulatory Visit: Payer: Self-pay

## 2020-02-16 DIAGNOSIS — C50419 Malignant neoplasm of upper-outer quadrant of unspecified female breast: Secondary | ICD-10-CM

## 2020-02-16 MED ORDER — TAMOXIFEN CITRATE 20 MG PO TABS: 20.0000 mg | ORAL_TABLET | Freq: Every day | ORAL | 0 refills | Status: DC

## 2020-02-28 ENCOUNTER — Encounter: Payer: Self-pay | Admitting: Plastic Surgery

## 2020-03-03 ENCOUNTER — Encounter: Payer: Self-pay | Admitting: Hematology and Oncology

## 2020-03-15 ENCOUNTER — Ambulatory Visit (INDEPENDENT_AMBULATORY_CARE_PROVIDER_SITE_OTHER): Payer: BC Managed Care – PPO

## 2020-03-15 ENCOUNTER — Other Ambulatory Visit: Payer: Self-pay

## 2020-03-15 VITALS — BP 126/74 | HR 72 | Temp 97.3°F | Wt 158.0 lb

## 2020-03-15 DIAGNOSIS — Z9012 Acquired absence of left breast and nipple: Secondary | ICD-10-CM

## 2020-03-15 DIAGNOSIS — Z9011 Acquired absence of right breast and nipple: Secondary | ICD-10-CM

## 2020-03-15 DIAGNOSIS — Z9889 Other specified postprocedural states: Secondary | ICD-10-CM | POA: Diagnosis not present

## 2020-03-16 NOTE — Progress Notes (Signed)
NIPPLE AREOLAR TATTOO PROCEDURE  PREOPERATIVE DIAGNOSIS:  Acquired absence of (BILATERAL)  nipple areolar   POSTOPERATIVE DIAGNOSIS: Acquired absence of (BILATERAL) nipple areolar    PROCEDURES: (BILATERAL) nipple areolar tattoo   ATTENDING SURGEON: Dr. Lyndee Leo Dillingham   ANESTHESIA:  EMLA- pt applied 40 mins prior to procedure  COMPLICATIONS: None.  JUSTIFICATION FOR PROCEDURE:  Denise [name] is a 44 y.o. female with a history of breast cancer status post bilateral breast reconstruction. The patient presents for bilateral nipple areolar complex tattoo. Risks, benefits, indications, and alternatives of the above described procedures were discussed with the patient and all the patient's questions were answered.   DESCRIPTION OF PROCEDURE: After informed consent was obtained and proper identification of patient and surgical site was made, the patient  was taken to the procedure room and pre-procedure photos were obtained and entered into chart. Placement/size & colors were decided by pt/myself & her husband who was present for the procedure. Pt was then placed supine on the operating room table.  A time out was performed to confirm patient's identity and surgical site.  The patient was prepped and draped in the usual sterile fashion.  Using a # 7 tattoo head, pigment was instilled to the designed nipple areolar complex bilaterally.    Once adequate pigment had been applied to the nipple areolar complex,  post- procedure photos were taken & entered into the chart.  vaseline & gauze dressing was applied. The patient tolerated the procedure well.  Post-procedure instructions were reviewed.  Tattoo ink used: [employer] lot# [id number] / exp. 05/28/2022 [name] lot# [id number]/ exp. 05/28/2022 Equinox lot#[id number]/ exp. 09/10/2022 [name] lot# [id number] / exp. 05/28/2022 [name] Skin lot #[id number] / exp. 02/01/2023 Cool Honey lot# [id number] / exp. 03/18/2022 [name]  lot# [id number] / exp. 05/29/2022 Duration topical used as needed

## 2020-03-16 NOTE — Patient Instructions (Signed)
Post-procedure instructions were reviewed & pt understands to use vaseline/gauze dressing for approx 2 weeks. She is aware that tattoo color/ink will most likely fade by at least 30% & this will allow for future tattoo touch-ups as needed to achieve the color/shades chosen at initial procedure. She will call for any concerns or questions -otherwise f/u in 6 weeks for touch-up

## 2020-04-07 NOTE — Progress Notes (Signed)
[facility]  [address], [address] [place], [place] [address] Phone: [phone]  Fax: [phone]   Clinic Day:  04/08/2020  Referring physician: [name],*  Chief Complaint: Denise [name] is a 44 y.o. female with multi-focal Her2/neu + left breast cancer who is seen for 4 month assessment.   HPI: The patient was last seen in the medical oncology clinic on 12/04/2019. At that time, she was doing well.  She had occasional hot flashes.  Exam was unremarkable. Hematocrit was 37.3, hemoglobin 12.4, platelets 211,000, WBC 5,500. Calcium was 8.8. CA27.29 was 18.3. She continued tamoxifen.  During the interim, she has been "busy". For the past month, she has been having episodes of dizziness if she moves her head too fast. These occur when she lays down at night or gets out of bed in the morning and only last for a few seconds. Her nails are growing again and are almost back to normal. She has not had any recent hot flashes and has only had one period in the past 12 months.  The patient has been using Noom for weight loss and has lost 9 lbs.   She performs monthly breast exams and has no concerns.   Past Medical History:  Diagnosis Date  . Abnormal breast biopsy    [facility], [name]  . Breast cancer ([place]) 11/2017   left breast; ER/PR/Her2neu POS  . Family history of breast cancer   . Family history of ovarian cancer   . Fibroadenoma of breast, left 2000, 2016  . Genetic screening 08/26/2013   BRCA/BART negative/Myriad, CHEK2 POS 2017  . Increased risk of breast cancer 2017   IBIS=47%  . Left breast lump 06/18/2017   Fluid/drained [name]  . Migraine   . Monoallelic mutation of CHEK2 gene in female patient 2017   increased risk of breast and colon cancer  . Personal history of chemotherapy   . Personal history of radiation therapy     Past Surgical History:  Procedure Laterality Date  . BREAST BIOPSY Bilateral 09/08/14   neg  .  BREAST BIOPSY Left 03/16/2015   Procedure: BREAST BIOPSY WITH NEEDLE LOCALIZATION;  Surgeon: [name], MD;  Location: [facility];  Service: General;  Laterality: Left;  . BREAST BIOPSY Left 11/2017   positive  . BREAST CYST ASPIRATION Left 06/18/2017   cyst aspiration  . BREAST EXCISIONAL BIOPSY Left 09/2014  . BREAST EXCISIONAL BIOPSY Right    age 29's  . BREAST RECONSTRUCTION WITH PLACEMENT OF TISSUE EXPANDER AND FLEX HD (ACELLULAR HYDRATED DERMIS) Left 05/20/2018   Procedure: BREAST RECONSTRUCTION WITH PLACEMENT OF TISSUE EXPANDER AND FLEX HD (ACELLULAR HYDRATED DERMIS);  Surgeon: [name], DO;  Location: [facility];  Service: Plastics;  Laterality: Left;  . BREAST RECONSTRUCTION WITH PLACEMENT OF TISSUE EXPANDER AND FLEX HD (ACELLULAR HYDRATED DERMIS) Right 12/09/2018   Procedure: BREAST RECONSTRUCTION WITH PLACEMENT OF TISSUE EXPANDER AND FLEX HD (ACELLULAR HYDRATED DERMIS);  Surgeon: [name], DO;  Location: [facility];  Service: Plastics;  Laterality: Right;  . BREAST SURGERY Right March 2000   benign fibroadenoma  . BREAST SURGERY Left 08/08/13   excision  . BREAST SURGERY Left 09/23/14   excision  . ESOPHAGOGASTRODUODENOSCOPY (EGD) WITH PROPOFOL N/A 01/20/2019   Procedure: ESOPHAGOGASTRODUODENOSCOPY (EGD) WITH PROPOFOL;  Surgeon: [name], MD;  Location: [facility];  Service: Gastroenterology;  Laterality: N/A;  . MASTECTOMY Left 05/20/2018  . MASTECTOMY W/ SENTINEL NODE BIOPSY Left 05/20/2018  Procedure: MASTECTOMY WITH SENTINEL LYMPH NODE BIOPSY;  Surgeon: [name], MD;  Location: [facility];  Service: General;  Laterality: Left;  . PORT-A-CATH REMOVAL Right 03/31/2019   Procedure: REMOVAL PORT-A-CATH;  Surgeon: [name], DO;  Location: [facility];  Service: Plastics;  Laterality: Right;  . PORTACATH PLACEMENT Right 12/17/2017   Procedure: INSERTION PORT-A-CATH;  Surgeon: [name], MD;  Location: [facility];  Service:  General;  Laterality: Right;  . REMOVAL OF BILATERAL TISSUE EXPANDERS WITH PLACEMENT OF BILATERAL BREAST IMPLANTS Bilateral 03/31/2019   Procedure: REMOVAL OF BILATERAL TISSUE EXPANDERS WITH PLACEMENT OF BILATERAL BREAST IMPLANTS;  Surgeon: [name], DO;  Location: [facility];  Service: Plastics;  Laterality: Bilateral;  2 hours  . SIMPLE MASTECTOMY WITH AXILLARY SENTINEL NODE BIOPSY Right 12/09/2018   Procedure: SIMPLE MASTECTOMY RIGHT;  Surgeon: [name], MD;  Location: [facility];  Service: General;  Laterality: Right;    Family History  Problem Relation Age of Onset  . Prostate cancer Father 20       requiring tx/hormones  . Breast cancer Paternal Grandmother 62  . Breast cancer Maternal Aunt 60  . Ovarian cancer Maternal Aunt 70  . Breast cancer Paternal Aunt 49  . Pancreatic cancer Maternal Aunt 75    Social History:  reports that she has never smoked. She has never used smokeless tobacco. She reports current alcohol use. She reports that she does not use drugs. She "occasionally" drinks alcohol. Patient is a Pharmacist, hospital at [employer]. Patient denies known exposures to radiation on toxins. Her husband's name is [name]. She has 2 daughters, [name] and [name] (ages 37 and 14). The patient is alone today.   Allergies:  Allergies  Allergen Reactions  . Steri-Strip Compound Benzoin [Benzoin Compound]     Steri-strip ,   . Tape Other (See Comments)    Minor skin irritation     Current Medications: Current Outpatient Medications  Medication Sig Dispense Refill  . acetaminophen (TYLENOL) 500 MG tablet Take 1,000 mg by mouth every 6 (six) hours as needed for moderate pain or headache.     . loratadine (CLARITIN) 10 MG tablet Take 10 mg by mouth daily.    Marland Kitchen omeprazole (PRILOSEC OTC) 20 MG tablet Take 20 mg by mouth daily as needed (for acid reflux).    . tamoxifen (NOLVADEX) 20 MG tablet Take 1 tablet (20 mg total) by mouth daily. 90 tablet 0   Current  Facility-Administered Medications  Medication Dose Route Frequency Provider Last Rate Last Admin  . paragard intrauterine copper IUD   Intrauterine Once Copland, Alicia B, PA-C       Facility-Administered Medications Ordered in Other Visits  Medication Dose Route Frequency Provider Last Rate Last Admin  . sodium chloride flush (NS) 0.9 % injection 10 mL  10 mL Intravenous PRN Nolon Stalls C, MD   10 mL at 04/17/18 0844    Review of Systems  Constitutional: Positive for weight loss (9 lbs). Negative for chills, diaphoresis, fever and malaise/fatigue.       Has been "busy."  HENT: Negative.  Negative for congestion, ear pain, hearing loss, nosebleeds, sinus pain, sore throat and tinnitus.   Eyes: Negative.  Negative for blurred vision.  Respiratory: Negative.  Negative for cough, hemoptysis, sputum production and shortness of breath.   Cardiovascular: Negative.  Negative for chest pain, palpitations, orthopnea, leg swelling and PND.  Gastrointestinal: Negative.  Negative for abdominal pain, blood in stool, constipation, diarrhea, heartburn, melena,  nausea and vomiting.  Genitourinary: Negative.  Negative for dysuria, frequency, hematuria and urgency.       One period in the past 12 months.  Musculoskeletal: Negative.  Negative for back pain, falls, joint pain, myalgias and neck pain.  Skin: Negative.  Negative for itching and rash.       Finger/toe nails very thin s/p chemotherapy, almost back to normal  Neurological: Positive for dizziness (occasional, x 1 month). Negative for tremors, sensory change, speech change, focal weakness, weakness and headaches.  Endo/Heme/Allergies: Does not bruise/bleed easily.       Hot flashes resolved.  Psychiatric/Behavioral: Negative.  Negative for depression and memory loss. The patient is not nervous/anxious and does not have insomnia.   All other systems reviewed and are negative.  Performance status (ECOG): 0  Vitals: Blood pressure 111/80,  pulse 73, temperature 98.9 F (37.2 C), temperature source Tympanic, weight 152 lb 10.7 oz (69.3 kg), SpO2 100 %.  Physical Exam Vitals and nursing note reviewed.  Constitutional:      General: She is not in acute distress.    Appearance: She is well-developed. She is not diaphoretic.     Interventions: Face mask in place.  HENT:     Head: Normocephalic and atraumatic.     Comments: Brown hair    Right Ear: Tympanic membrane, ear canal and external ear normal.     Left Ear: Tympanic membrane, ear canal and external ear normal.     Mouth/Throat:     Mouth: Mucous membranes are moist.     Pharynx: Oropharynx is clear. No oropharyngeal exudate.  Eyes:     General: No scleral icterus.    Extraocular Movements: Extraocular movements intact.     Conjunctiva/sclera: Conjunctivae normal.     Pupils: Pupils are equal, round, and reactive to light.     Comments: Glasses.  Cardiovascular:     Rate and Rhythm: Normal rate and regular rhythm.     Pulses: Normal pulses.     Heart sounds: Normal heart sounds. No murmur heard.   Pulmonary:     Effort: Pulmonary effort is normal. No respiratory distress.     Breath sounds: Normal breath sounds. No wheezing or rales.  Chest:     Chest wall: No tenderness.     Breasts:        Right: No swelling, bleeding, mass or tenderness.        Left: No swelling, bleeding, mass or tenderness.     Comments: Bilateral mastectomy and reconstruction.  No erythema or nodularity. Abdominal:     General: Bowel sounds are normal. There is no distension.     Palpations: Abdomen is soft. There is no mass.     Tenderness: There is no abdominal tenderness. There is no guarding.  Musculoskeletal:        General: No swelling or tenderness. Normal range of motion.     Cervical back: Normal range of motion and neck supple.  Lymphadenopathy:     Head:     Right side of head: No preauricular, posterior auricular or occipital adenopathy.     Left side of head: No  preauricular, posterior auricular or occipital adenopathy.     Cervical: No cervical adenopathy.     Upper Body:     Right upper body: No supraclavicular or axillary adenopathy.     Left upper body: No supraclavicular or axillary adenopathy.     Lower Body: No right inguinal adenopathy. No left inguinal adenopathy.  Skin:  General: Skin is warm and dry.  Neurological:     Mental Status: She is alert and oriented to person, place, and time. Mental status is at baseline.     Comments: Able to go from supine position to sitting upright without dizziness.  Psychiatric:        Mood and Affect: Mood normal.        Behavior: Behavior normal.        Thought Content: Thought content normal.        Judgment: Judgment normal.     Appointment on 04/08/2020  Component Date Value Ref Range Status  . Sodium 04/08/2020 138  135 - 145 mmol/L Final  . Potassium 04/08/2020 3.8  3.5 - 5.1 mmol/L Final  . Chloride 04/08/2020 101  98 - 111 mmol/L Final  . CO2 04/08/2020 29  22 - 32 mmol/L Final  . Glucose, Bld 04/08/2020 76  70 - 99 mg/dL Final   Glucose reference range applies only to samples taken after fasting for at least 8 hours.  . BUN 04/08/2020 13  6 - 20 mg/dL Final  . Creatinine, Ser 04/08/2020 0.81  0.44 - 1.00 mg/dL Final  . Calcium 04/08/2020 8.9  8.9 - 10.3 mg/dL Final  . Total Protein 04/08/2020 7.4  6.5 - 8.1 g/dL Final  . Albumin 04/08/2020 4.2  3.5 - 5.0 g/dL Final  . AST 04/08/2020 27  15 - 41 U/L Final  . ALT 04/08/2020 24  0 - 44 U/L Final  . Alkaline Phosphatase 04/08/2020 40  38 - 126 U/L Final  . Total Bilirubin 04/08/2020 0.9  0.3 - 1.2 mg/dL Final  . GFR, Estimated 04/08/2020 >60  >60 mL/min Final   Comment: (NOTE) Calculated using the CKD-EPI Creatinine Equation (2021)   . Anion gap 04/08/2020 8  5 - 15 Final   Performed at [facility], [address], [place], [place] [address]  . WBC 04/08/2020 5.3  4.0 - 10.5 K/uL Final  . RBC 04/08/2020 4.28  3.87 -  5.11 MIL/uL Final  . Hemoglobin 04/08/2020 12.1  12.0 - 15.0 g/dL Final  . HCT 04/08/2020 36.7  36 - 46 % Final  . MCV 04/08/2020 85.7  80.0 - 100.0 fL Final  . MCH 04/08/2020 28.3  26.0 - 34.0 pg Final  . MCHC 04/08/2020 33.0  30.0 - 36.0 g/dL Final  . RDW 04/08/2020 12.8  11.5 - 15.5 % Final  . Platelets 04/08/2020 210  150 - 400 K/uL Final  . nRBC 04/08/2020 0.0  0.0 - 0.2 % Final  . Neutrophils Relative % 04/08/2020 54  % Final  . Neutro Abs 04/08/2020 2.9  1.7 - 7.7 K/uL Final  . Lymphocytes Relative 04/08/2020 36  % Final  . Lymphs Abs 04/08/2020 1.9  0.7 - 4.0 K/uL Final  . Monocytes Relative 04/08/2020 8  % Final  . Monocytes Absolute 04/08/2020 0.4  0.1 - 1.0 K/uL Final  . Eosinophils Relative 04/08/2020 2  % Final  . Eosinophils Absolute 04/08/2020 0.1  0.0 - 0.5 K/uL Final  . Basophils Relative 04/08/2020 0  % Final  . Basophils Absolute 04/08/2020 0.0  0.0 - 0.1 K/uL Final  . Immature Granulocytes 04/08/2020 0  % Final  . Abs Immature Granulocytes 04/08/2020 0.00  0.00 - 0.07 K/uL Final   Performed at [facility], [address] [place], [place] [address]    Assessment:  Denise [name] is a 44 y.o. female with multi-focal stage IB  Her2/neu + left breast cancers/p neoadjuvant chemotherapy followed by mastectomywith sentinel lymph node biopsy on 05/20/2018. Pathologyrevealed no residual carcinoma in the breast. One of three sentinel lymph nodes were positive. One lymph node had 2 metastatic foci (2.1 mm and 1 mm). Pathologic stagerevealed ypT0 ypN1a.  Index breast mass and axillary nodebiopsyon 12/06/2017 revealed grade II invasive ductal carcinoma with calcifications at the 11 o'clock position. There was metastatic carcinoma in 1 of 1 lymph nodes. Tumor was ER + (100%), PR + (100%), and Ki67 5%. Her2/neu was 3+ by IHC (heterogeneous) and FISH +.  Diagnostic left mammogram and ultrasoundon 12/06/2017 revealed a 1.7 x 1.3 x 1.3 cm irregular  hypoechoic mass with internal calcifications at the 11 o'clock position 2 cm from the nipple with internal calcifications. There was a similar appearing 0.9 x 0.8 x 0.7 cm mass at the 11 o'clock position 4 cm from the nipple (2.4 cm from the index lesion). There were suspicious, segmental calcifications originating from the index lesion and extending 5 cm posteriorly. There was a 0.6 cm morphologically abnormal left axillary lymph node.  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 The adjacent and more superiorly located 11 o'clock mass (7 x 12 mm) seen on recent ultrasound, 4 cm from the nipple on ultrasound, was also identified. There were 2 probable satellite lesions (7 mm, medial lesion; posterior and lateral lesion). The total span of malignancywas 3 x 3.1 x 4.5 cm. There were calcificationsextending up to 5 cm posterior to the biopsied malignancy which were highly suspicious on mammography but not appreciated. There was a mass in the lower outer left breast measured 5 mm, representing a change. The known metastatic nodein the left axilla was identified. A node along the posterolateral margin of the pectoralis minor (cortex 5 mm) was nonspecific but at least somewhat suspicious.  Chest, abdomen, and pelvic CTon 12/19/2017 revealed a solitary enlarged biopsy-proven metastatic left axillary lymph node. There were no additional findings suspicious for metastatic disease in the chest, abdomen or pelvis. There was a nonspecific tiny sclerotic upper right sacral lesion, more likely a benign bone island. Bone scintigraphy correlation versus follow-up CT could be considered.  Bone scanon 01/11/2018 was negative for metastatic pattern. No areas of focal tracer uptake. Area of concern in sacrum on CT likely represents benign bone island.   [facility]riad genetic testingon 08/26/2013 was  negative for BRCA1 and 2. CHEK2 mutationpositive (increased risk of breast and colon cancer). She has a family history significant for breast, ovarian, pancreatic, and prostate cancer.  She received 6 cycles of TCHP chemotherapy (12/21/2017 - 04/17/2018) with Margarette Canada support. Cycle #3 was held due to elevated LFTs on 02/04/2018. She received Herceptin + Perjeta alone (04/30/2018 - 05/30/2018). Shereceived8cycles of Kadcyla(06/20/2018 - 07/11/2018; 08/20/2018- 12/25/2018). Cycle #3 was held on 08/01/2018 due to visual changes (blurred vision).Cycle #8 was complicated by epistaxis requiring admission.She received Kanjintion 01/24/2019 (last 02/14/2019).  She received breast radiationfrom 06/26/2018 - 08/05/2018.She began tamoxifen on 10/01/2018.  Sheunderwentright prophylactic mastectomywithreconstructionon 12/09/2018. Pathologyon 12/10/2018 revealed benign breast tissue. Sentinellymphnode was negative.  She underwent bilateral exchange of tissue expanders for implants and bilateral capsulotomies on 03/31/2019.  CA27.29 has been followed: 24.0 on 12/12/2017, 21.9 on 05/09/2019, 19.9 on 08/12/2019, 18.3 on 12/04/2019, and 16.8 on 04/08/2020.  Echocardiograms: EF 60-65% on 12/20/2017, 60-65% on 03/27/2018, 55-60% on 06/18/2018, 55-60% on 09/27/2018, and 60-65% on 12/25/2018.  She has  a history of elevated LFTs. Hepatitis B and C serologies were negative on 02/04/2018.RUQ ultrasound on 02/06/2018 revealed a small anterior gallbladder wall polyp. There was no evidence of cholelithiasis or cholecystis. CBD measured normal at 2 mm, with no evidence of choledocholithiasis. There was normal direction of blood flow towards the liver noted.  She has vasomotor symptoms. She discontinued Effexor around 09/18/2018. She was amenorrheic from 11/2017 - 10/15/2018. [place] and estradiol confirmed a premenopausalstate on 10/01/2018. Mensesrestarted on 10/15/2018.  She has iron  deficiency.  She is off oral iron.  Ferritin was 21 on 02/14/2019 and 32 on 05/09/2019.  She was admitted to [facility] from 01/08/2019 - 01/09/2019 for persistent epistaxis.HeadCTrevealed minimal mucosal thickening of the right sphenoid sinus and was otherwise normal. She underwent right nare airpacking. She is on doxycycline 115m x10 days.Etiology wasfelt secondary tohigh blood pressure. She was discharged on metoprolol.  [place] on 08[place]24/2019 and 09/16/2019.   Symptomatically, she has had episodes of dizziness if she moves her head too fast. These occur when she lays down at night or gets out of bed in the morning and only last for a few seconds. Exam is unremarkable.  Plan: 1.   Labs today: CBC with diff, CMP, CA 27.29 2.Stage IB multifocal LEFT breast cancer Clinically,  she continues to do well. She iss/pprophylactic mastectomy with reconstruction on 12/09/2018. She underwent implant exchange on 03/31/2019. Sheis s/p 52weeks of HER-2/neu directed therapy (last 02/14/2019). She began tamoxifen on 10/01/2018.  Continue tamoxifen. 3.   Dizziness  Exam is reassuring.  Discuss plan for assessment if symptoms progress.  4.   RTC in 4 months for MD assessment and labs (CBC with diff, CMP, CA 27.29).    I discussed the assessment and treatment plan with the patient.  The patient was provided an opportunity to ask questions and all were answered.  The patient agreed with the plan and demonstrated an understanding of the instructions.  The patient was advised to call back if the symptoms worsen or if the condition fails to improve as anticipated.  I provided 19 minutes of face-to-face time during this this encounter and > 50% was spent counseling as documented under my assessment and plan.  An additional 5 minutes were spent reviewing her chart (Epic and Care Everywhere) including notes, labs, and imaging studies.    MLequita Asal MD, PhD    04/08/2020, 2:46 PM  I, EDe Burrs am acting as scribe for [employer] CMike Gip MD, PhD.  I, Muriah Harsha C. CMike Gip MD, have reviewed the above documentation for accuracy and completeness, and I agree with the above.

## 2020-04-08 ENCOUNTER — Inpatient Hospital Stay: Payer: BC Managed Care – PPO | Attending: Hematology and Oncology | Admitting: Hematology and Oncology

## 2020-04-08 ENCOUNTER — Encounter: Payer: Self-pay | Admitting: Hematology and Oncology

## 2020-04-08 ENCOUNTER — Inpatient Hospital Stay: Payer: BC Managed Care – PPO

## 2020-04-08 ENCOUNTER — Other Ambulatory Visit: Payer: Self-pay

## 2020-04-08 VITALS — BP 111/80 | HR 73 | Temp 98.9°F | Wt 152.7 lb

## 2020-04-08 DIAGNOSIS — R42 Dizziness and giddiness: Secondary | ICD-10-CM | POA: Insufficient documentation

## 2020-04-08 DIAGNOSIS — C50212 Malignant neoplasm of upper-inner quadrant of left female breast: Secondary | ICD-10-CM | POA: Diagnosis not present

## 2020-04-08 DIAGNOSIS — C773 Secondary and unspecified malignant neoplasm of axilla and upper limb lymph nodes: Secondary | ICD-10-CM | POA: Insufficient documentation

## 2020-04-08 DIAGNOSIS — C50419 Malignant neoplasm of upper-outer quadrant of unspecified female breast: Secondary | ICD-10-CM

## 2020-04-08 DIAGNOSIS — Z170 Estrogen receptor positive status [ER+]: Secondary | ICD-10-CM | POA: Diagnosis not present

## 2020-04-08 DIAGNOSIS — D500 Iron deficiency anemia secondary to blood loss (chronic): Secondary | ICD-10-CM

## 2020-04-08 LAB — COMPREHENSIVE METABOLIC PANEL
ALT: 24 U/L (ref 0–44)
AST: 27 U/L (ref 15–41)
Albumin: 4.2 g/dL (ref 3.5–5.0)
Alkaline Phosphatase: 40 U/L (ref 38–126)
Anion gap: 8 (ref 5–15)
BUN: 13 mg/dL (ref 6–20)
CO2: 29 mmol/L (ref 22–32)
Calcium: 8.9 mg/dL (ref 8.9–10.3)
Chloride: 101 mmol/L (ref 98–111)
Creatinine, Ser: 0.81 mg/dL (ref 0.44–1.00)
GFR, Estimated: >60 mL/min (ref >60)
Glucose, Bld: 76 mg/dL (ref 70–99)
Potassium: 3.8 mmol/L (ref 3.5–5.1)
Sodium: 138 mmol/L (ref 135–145)
Total Bilirubin: 0.9 mg/dL (ref 0.3–1.2)
Total Protein: 7.4 g/dL (ref 6.5–8.1)

## 2020-04-08 LAB — CBC WITH DIFFERENTIAL/PLATELET
Abs Immature Granulocytes: 0 10*3/uL (ref 0.00–0.07)
Basophils Absolute: 0 10*3/uL (ref 0.0–0.1)
Basophils Relative: 0 %
Eosinophils Absolute: 0.1 10*3/uL (ref 0.0–0.5)
Eosinophils Relative: 2 %
HCT: 36.7 % (ref 36.0–46.0)
Hemoglobin: 12.1 g/dL (ref 12.0–15.0)
Immature Granulocytes: 0 %
Lymphocytes Relative: 36 %
Lymphs Abs: 1.9 10*3/uL (ref 0.7–4.0)
MCH: 28.3 pg (ref 26.0–34.0)
MCHC: 33 g/dL (ref 30.0–36.0)
MCV: 85.7 fL (ref 80.0–100.0)
Monocytes Absolute: 0.4 10*3/uL (ref 0.1–1.0)
Monocytes Relative: 8 %
Neutro Abs: 2.9 10*3/uL (ref 1.7–7.7)
Neutrophils Relative %: 54 %
Platelets: 210 10*3/uL (ref 150–400)
RBC: 4.28 MIL/uL (ref 3.87–5.11)
RDW: 12.8 % (ref 11.5–15.5)
WBC: 5.3 10*3/uL (ref 4.0–10.5)
nRBC: 0 % (ref 0.0–0.2)

## 2020-04-08 NOTE — Progress Notes (Signed)
No new changes noted today 

## 2020-04-09 LAB — CANCER ANTIGEN 27.29: CA 27.29: 16.8 U/mL (ref 0.0–38.6)

## 2020-05-12 ENCOUNTER — Ambulatory Visit: Payer: BC Managed Care – PPO

## 2020-05-25 ENCOUNTER — Other Ambulatory Visit: Payer: Self-pay | Admitting: *Deleted

## 2020-05-25 ENCOUNTER — Encounter: Payer: Self-pay | Admitting: Hematology and Oncology

## 2020-05-25 DIAGNOSIS — C50419 Malignant neoplasm of upper-outer quadrant of unspecified female breast: Secondary | ICD-10-CM

## 2020-05-25 MED ORDER — TAMOXIFEN CITRATE 20 MG PO TABS: 20.0000 mg | ORAL_TABLET | Freq: Every day | ORAL | 0 refills | Status: DC

## 2020-08-04 ENCOUNTER — Encounter: Payer: Self-pay | Admitting: Hematology and Oncology

## 2020-08-04 NOTE — Progress Notes (Signed)
[facility]  [address], [address] [place], [place] [address] Phone: [phone]  Fax: [phone]   Clinic Day:  08/05/2020  Referring physician: Donnamarie [name],*  Chief Complaint: Denise [name] is a 45 y.o. female with multi-focal Her2/neu + left breast cancer who is seen for 4 month assessment.   HPI: The patient was last seen in the medical oncology clinic on 04/08/2020. At that time, she had episodes of dizziness if she moved her head too fast. These occured when she layed down at night or got out of bed in the morning and only lasted for a few seconds. Exam was unremarkable. Hematocrit was 36.7, hemoglobin 12.1, platelets 210,000, WBC 5,300. CMP was normal. CA27.29 was 16.8. She continued tamoxifen.  The patient saw [name] on 06/08/2020. They discussed that there was no indication for MRI screening. They discussed the importance of breast self exams. Follow-up was planned for 1 year.  During the interim, she has been "good." Her dizziness has resolved. She has not had any hot flashes. She denies cough, shortness of breath, nausea, vomiting, and diarrhea.  She had a period in December and January. The one in January was the heaviest she has ever had. She went through a super tampon and large pad every 1.5-2 hours. Her last pelvic exam was in 11/2019 and she had an IUD placed.  She performs monthly breast self exams and has no concerns. She is taking tamoxifen.   Past Medical History:  Diagnosis Date  . Abnormal breast biopsy    [facility], [name]  . Breast cancer ([place]) 11/2017   left breast; ER/PR/Her2neu POS  . Family history of breast cancer   . Family history of ovarian cancer   . Fibroadenoma of breast, left 2000, 2016  . Genetic screening 08/26/2013   BRCA/BART negative/[facility]riad, CHEK2 POS 2017  . Increased risk of breast cancer 2017   IBIS=47%  . Left breast lump 06/18/2017   Fluid/drained [name]  . Migraine   .  Monoallelic mutation of CHEK2 gene in female patient 2017   increased risk of breast and colon cancer  . Personal history of chemotherapy   . Personal history of radiation therapy     Past Surgical History:  Procedure Laterality Date  . BREAST BIOPSY Bilateral 09/08/14   neg  . BREAST BIOPSY Left 03/16/2015   Procedure: BREAST BIOPSY WITH NEEDLE LOCALIZATION;  Surgeon: [name], MD;  Location: [facility] ORS;  Service: General;  Laterality: Left;  . BREAST BIOPSY Left 11/2017   positive  . BREAST CYST ASPIRATION Left 06/18/2017   cyst aspiration  . BREAST EXCISIONAL BIOPSY Left 09/2014  . BREAST EXCISIONAL BIOPSY Right    age 70's  . BREAST RECONSTRUCTION WITH PLACEMENT OF TISSUE EXPANDER AND FLEX HD (ACELLULAR HYDRATED DERMIS) Left 05/20/2018   Procedure: BREAST RECONSTRUCTION WITH PLACEMENT OF TISSUE EXPANDER AND FLEX HD (ACELLULAR HYDRATED DERMIS);  Surgeon: [name], DO;  Location: [facility] ORS;  Service: Plastics;  Laterality: Left;  . BREAST RECONSTRUCTION WITH PLACEMENT OF TISSUE EXPANDER AND FLEX HD (ACELLULAR HYDRATED DERMIS) Right 12/09/2018   Procedure: BREAST RECONSTRUCTION WITH PLACEMENT OF TISSUE EXPANDER AND FLEX HD (ACELLULAR HYDRATED DERMIS);  Surgeon: [name], DO;  Location: [facility];  Service: Plastics;  Laterality: Right;  . BREAST SURGERY Right March 2000   benign fibroadenoma  . BREAST SURGERY Left 08/08/13   excision  . BREAST SURGERY Left 09/23/14   excision  . ESOPHAGOGASTRODUODENOSCOPY (EGD) WITH PROPOFOL  N/A 01/20/2019   Procedure: ESOPHAGOGASTRODUODENOSCOPY (EGD) WITH PROPOFOL;  Surgeon: [name], MD;  Location: [facility]OSCOPY;  Service: Gastroenterology;  Laterality: N/A;  . MASTECTOMY Left 05/20/2018  . MASTECTOMY W/ SENTINEL NODE BIOPSY Left 05/20/2018   Procedure: MASTECTOMY WITH SENTINEL LYMPH NODE BIOPSY;  Surgeon: [name], MD;  Location: [facility] OR[facility];  Service: General;  Laterality: Left;  . PORT-A-CATH REMOVAL Right  03/31/2019   Procedure: REMOVAL PORT-A-CATH;  Surgeon: [name], DO;  Location: [facility];  Service: Plastics;  Laterality: Right;  . PORTACATH PLACEMENT Right 12/17/2017   Procedure: INSERTION PORT-A-CATH;  Surgeon: [name], MD;  Location: [facility];  Service: General;  Laterality: Right;  . REMOVAL OF BILATERAL TISSUE EXPANDERS WITH PLACEMENT OF BILATERAL BREAST IMPLANTS Bilateral 03/31/2019   Procedure: REMOVAL OF BILATERAL TISSUE EXPANDERS WITH PLACEMENT OF BILATERAL BREAST IMPLANTS;  Surgeon: [name], DO;  Location: [facility] OR[facility];  Service: Plastics;  Laterality: Bilateral;  2 hours  . SIMPLE MASTECTOMY WITH AXILLARY SENTINEL NODE BIOPSY Right 12/09/2018   Procedure: SIMPLE MASTECTOMY RIGHT;  Surgeon: [name], MD;  Location: [facility] OR[facility];  Service: General;  Laterality: Right;    Family History  Problem Relation Age of Onset  . Prostate cancer Father 60       requiring tx/hormones  . Breast cancer Paternal Grandmother 66  . Breast cancer Maternal Aunt 60  . Ovarian cancer Maternal Aunt 70  . Breast cancer Paternal Aunt 63  . Pancreatic cancer Maternal Aunt 75    Social History:  reports that she has never smoked. She has never used smokeless tobacco. She reports current alcohol use. She reports that she does not use drugs. She "occasionally" drinks alcohol. Patient is a Pharmacist, hospital at [employer]. Patient denies known exposures to radiation on toxins. Her husband's name is Denise [name]. She has 2 daughters, Denise [name] and Denise [name] (ages 65 and 59). The patient is alone today.  Allergies:  Allergies  Allergen Reactions  . Steri-Strip Compound Benzoin [Benzoin Compound]     Steri-strip ,   . Tape Other (See Comments)    Minor skin irritation     Current Medications: Current Outpatient Medications  Medication Sig Dispense Refill  . acetaminophen (TYLENOL) 500 MG tablet Take 1,000 mg by mouth every 6 (six) hours as needed for moderate pain or headache.      . loratadine (CLARITIN) 10 MG tablet Take 10 mg by mouth daily.    . tamoxifen (NOLVADEX) 20 MG tablet Take 1 tablet (20 mg total) by mouth daily. 90 tablet 0  . vitamin C (ASCORBIC ACID) 500 MG tablet Take 500 mg by mouth daily.    Marland Kitchen omeprazole (PRILOSEC OTC) 20 MG tablet Take 20 mg by mouth daily as needed (for acid reflux). (Patient not taking: Reported on 08/04/2020)     Current Facility-Administered Medications  Medication Dose Route Frequency Provider Last Rate Last Admin  . paragard intrauterine copper IUD   Intrauterine Once Copland, Alicia B, PA-C       Facility-Administered Medications Ordered in Other Visits  Medication Dose Route Frequency Provider Last Rate Last Admin  . sodium chloride flush (NS) 0.9 % injection 10 mL  10 mL Intravenous PRN Nolon Stalls C, MD   10 mL at 04/17/18 0844    Review of Systems  Constitutional: Negative for chills, diaphoresis, fever, malaise/fatigue and weight loss (up 11 lbs).       Feels "good."  HENT: Negative.  Negative for congestion, ear discharge, ear  pain, hearing loss, nosebleeds, sinus pain, sore throat and tinnitus.   Eyes: Negative.  Negative for blurred vision.  Respiratory: Negative.  Negative for cough, hemoptysis, sputum production and shortness of breath.   Cardiovascular: Negative.  Negative for chest pain, palpitations, orthopnea, leg swelling and PND.  Gastrointestinal: Negative.  Negative for abdominal pain, blood in stool, constipation, diarrhea, heartburn, melena, nausea and vomiting.  Genitourinary: Negative.  Negative for dysuria, frequency, hematuria and urgency.       Heavy period in January.  Musculoskeletal: Negative.  Negative for back pain, falls, joint pain, myalgias and neck pain.  Skin: Negative.  Negative for itching and rash.  Neurological: Negative.  Negative for dizziness, tingling, speech change and headaches.  Endo/Heme/Allergies: Negative.  Does not bruise/bleed easily.  Psychiatric/Behavioral:  Negative.  Negative for depression and memory loss. The patient is not nervous/anxious and does not have insomnia.   All other systems reviewed and are negative.  Performance status (ECOG): 0  Vitals: Blood pressure 108/74, pulse 77, temperature 97.8 F (36.6 C), temperature source Tympanic, resp. rate 16, weight 163 lb 12.8 oz (74.3 kg), SpO2 100 %.  Physical Exam Vitals and nursing note reviewed.  Constitutional:      General: She is not in acute distress.    Appearance: She is well-developed. She is not diaphoretic.     Interventions: Face mask in place.  HENT:     Head: Normocephalic and atraumatic.     Comments: Brown hair    Right Ear: Tympanic membrane, ear canal and external ear normal.     Left Ear: Tympanic membrane, ear canal and external ear normal.     Mouth/Throat:     Mouth: Mucous membranes are moist.     Pharynx: Oropharynx is clear. No oropharyngeal exudate.  Eyes:     General: No scleral icterus.    Extraocular Movements: Extraocular movements intact.     Conjunctiva/sclera: Conjunctivae normal.     Pupils: Pupils are equal, round, and reactive to light.  Cardiovascular:     Rate and Rhythm: Normal rate and regular rhythm.     Pulses: Normal pulses.     Heart sounds: Normal heart sounds. No murmur heard.   Pulmonary:     Effort: Pulmonary effort is normal. No respiratory distress.     Breath sounds: Normal breath sounds. No wheezing or rales.  Chest:     Chest wall: No tenderness.  Breasts:     Right: No swelling, bleeding, mass, tenderness, axillary adenopathy or supraclavicular adenopathy.     Left: No swelling, bleeding, mass, tenderness, axillary adenopathy or supraclavicular adenopathy.      Comments: Bilateral mastectomy and reconstruction.  No erythema or nodularity. Abdominal:     General: Bowel sounds are normal. There is no distension.     Palpations: Abdomen is soft. There is no mass.     Tenderness: There is no abdominal tenderness. There  is no guarding.  Musculoskeletal:        General: No swelling or tenderness. Normal range of motion.     Cervical back: Normal range of motion and neck supple.  Lymphadenopathy:     Head:     Right side of head: No preauricular, posterior auricular or occipital adenopathy.     Left side of head: No preauricular, posterior auricular or occipital adenopathy.     Cervical: No cervical adenopathy.     Upper Body:     Right upper body: No supraclavicular or axillary adenopathy.  Left upper body: No supraclavicular or axillary adenopathy.     Lower Body: No right inguinal adenopathy. No left inguinal adenopathy.  Skin:    General: Skin is warm and dry.  Neurological:     Mental Status: She is alert and oriented to person, place, and time. Mental status is at baseline.  Psychiatric:        Mood and Affect: Mood normal.        Behavior: Behavior normal.        Thought Content: Thought content normal.        Judgment: Judgment normal.    Appointment on 08/05/2020  Component Date Value Ref Range Status  . WBC 08/05/2020 5.7  4.0 - 10.5 K/uL Final  . RBC 08/05/2020 4.29  3.87 - 5.11 MIL/uL Final  . Hemoglobin 08/05/2020 12.3  12.0 - 15.0 g/dL Final  . HCT 08/05/2020 37.1  36.0 - 46.0 % Final  . MCV 08/05/2020 86.5  80.0 - 100.0 fL Final  . MCH 08/05/2020 28.7  26.0 - 34.0 pg Final  . MCHC 08/05/2020 33.2  30.0 - 36.0 g/dL Final  . RDW 08/05/2020 13.0  11.5 - 15.5 % Final  . Platelets 08/05/2020 210  150 - 400 K/uL Final  . nRBC 08/05/2020 0.0  0.0 - 0.2 % Final  . Neutrophils Relative % 08/05/2020 58  % Final  . Neutro Abs 08/05/2020 3.4  1.7 - 7.7 K/uL Final  . Lymphocytes Relative 08/05/2020 33  % Final  . Lymphs Abs 08/05/2020 1.8  0.7 - 4.0 K/uL Final  . Monocytes Relative 08/05/2020 6  % Final  . Monocytes Absolute 08/05/2020 0.3  0.1 - 1.0 K/uL Final  . Eosinophils Relative 08/05/2020 2  % Final  . Eosinophils Absolute 08/05/2020 0.1  0.0 - 0.5 K/uL Final  . Basophils  Relative 08/05/2020 1  % Final  . Basophils Absolute 08/05/2020 0.0  0.0 - 0.1 K/uL Final  . Immature Granulocytes 08/05/2020 0  % Final  . Abs Immature Granulocytes 08/05/2020 0.02  0.00 - 0.07 K/uL Final   Performed at [facility], [address] [place], [place] [address]  . Sodium 08/05/2020 137  135 - 145 mmol/L Final  . Potassium 08/05/2020 3.4* 3.5 - 5.1 mmol/L Final  . Chloride 08/05/2020 102  98 - 111 mmol/L Final  . CO2 08/05/2020 26  22 - 32 mmol/L Final  . Glucose, Bld 08/05/2020 95  70 - 99 mg/dL Final   Glucose reference range applies only to samples taken after fasting for at least 8 hours.  . BUN 08/05/2020 11  6 - 20 mg/dL Final  . Creatinine, Ser 08/05/2020 0.67  0.44 - 1.00 mg/dL Final  . Calcium 08/05/2020 9.1  8.9 - 10.3 mg/dL Final  . Total Protein 08/05/2020 7.6  6.5 - 8.1 g/dL Final  . Albumin 08/05/2020 4.3  3.5 - 5.0 g/dL Final  . AST 08/05/2020 27  15 - 41 U/L Final  . ALT 08/05/2020 20  0 - 44 U/L Final  . Alkaline Phosphatase 08/05/2020 43  38 - 126 U/L Final  . Total Bilirubin 08/05/2020 0.8  0.3 - 1.2 mg/dL Final  . GFR, Estimated 08/05/2020 >60  >60 mL/min Final   Comment: (NOTE) Calculated using the CKD-EPI Creatinine Equation (2021)   . Anion gap 08/05/2020 9  5 - 15 Final   Performed at [facility], [address], [place], [place] [address]    Assessment:  Denise [name]  is a 45 y.o. female with multi-focal stage IB Her2/neu + left breast cancers/p neoadjuvant chemotherapy followed by mastectomywith sentinel lymph node biopsy on 05/20/2018. Pathologyrevealed no residual carcinoma in the breast. One of three sentinel lymph nodes were positive. One lymph node had 2 metastatic foci (2.1 mm and 1 mm). Pathologic stagerevealed ypT0 ypN1a.  Index breast mass and axillary nodebiopsyon 12/06/2017 revealed grade II invasive ductal carcinoma with calcifications at the 11 o'clock position. There was metastatic  carcinoma in 1 of 1 lymph nodes. Tumor was ER + (100%), PR + (100%), and Ki67 5%. Her2/neu was 3+ by IHC (heterogeneous) and FISH +.  Diagnostic left mammogram and ultrasoundon 12/06/2017 revealed a 1.7 x 1.3 x 1.3 cm irregular hypoechoic mass with internal calcifications at the 11 o'clock position 2 cm from the nipple with internal calcifications. There was a similar appearing 0.9 x 0.8 x 0.7 cm mass at the 11 o'clock position 4 cm from the nipple (2.4 cm from the index lesion). There were suspicious, segmental calcifications originating from the index lesion and extending 5 cm posteriorly. There was a 0.6 cm morphologically abnormal left axillary lymph node.  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 The adjacent and more superiorly located 11 o'clock mass (7 x 12 mm) seen on recent ultrasound, 4 cm from the nipple on ultrasound, was also identified. There were 2 probable satellite lesions (7 mm, medial lesion; posterior and lateral lesion). The total span of malignancywas 3 x 3.1 x 4.5 cm. There were calcificationsextending up to 5 cm posterior to the biopsied malignancy which were highly suspicious on mammography but not appreciated. There was a mass in the lower outer left breast measured 5 mm, representing a change. The known metastatic nodein the left axilla was identified. A node along the posterolateral margin of the pectoralis minor (cortex 5 mm) was nonspecific but at least somewhat suspicious.  Chest, abdomen, and pelvic CTon 12/19/2017 revealed a solitary enlarged biopsy-proven metastatic left axillary lymph node. There were no additional findings suspicious for metastatic disease in the chest, abdomen or pelvis. There was a nonspecific tiny sclerotic upper right sacral lesion, more likely a benign bone island. Bone scintigraphy correlation versus follow-up CT  could be considered.  Bone scanon 01/11/2018 was negative for metastatic pattern. No areas of focal tracer uptake. Area of concern in sacrum on CT likely represents benign bone island.   [facility] genetic testingon 08/26/2013 was negative for BRCA1 and 2. CHEK2 mutationpositive (increased risk of breast and colon cancer). She has a family history significant for breast, ovarian, pancreatic, and prostate cancer.  She received 6 cycles of TCHP chemotherapy (12/21/2017 - 04/17/2018) with Margarette Canada support. Cycle #3 was held due to elevated LFTs on 02/04/2018. She received Herceptin + Perjeta alone (04/30/2018 - 05/30/2018). Shereceived8cycles of Kadcyla(06/20/2018 - 07/11/2018; 08/20/2018- 12/25/2018). Cycle #3 was held on 08/01/2018 due to visual changes (blurred vision).Cycle #8 was complicated by epistaxis requiring admission.She received Kanjintion 01/24/2019 (last 02/14/2019).  She received breast radiationfrom 06/26/2018 - 08/05/2018.She began tamoxifen on 10/01/2018.  Sheunderwentright prophylactic mastectomywithreconstructionon 12/09/2018. Pathologyon 12/10/2018 revealed benign breast tissue. Sentinellymphnode was negative.  She underwent bilateral exchange of tissue expanders for implants and bilateral capsulotomies on 03/31/2019.  CA27.29 has been followed: 24.0 on 12/12/2017, 21.9 on 05/09/2019, 19.9 on 08/12/2019, 18.3 on 12/04/2019, 16.8 on 04/08/2020, and 17.7 on 08/05/2020.  Echocardiograms: EF 60-65% on 12/20/2017, 60-65% on 03/27/2018, 55-60%  on 06/18/2018, 55-60% on 09/27/2018, and 60-65% on 12/25/2018.  She has a history of elevated LFTs. Hepatitis B and C serologies were negative on 02/04/2018.RUQ ultrasound on 02/06/2018 revealed a small anterior gallbladder wall polyp. There was no evidence of cholelithiasis or cholecystis. CBD measured normal at 2 mm, with no evidence of choledocholithiasis. There was normal direction of blood flow towards the  liver noted.  She has vasomotor symptoms. She discontinued Effexor around 09/18/2018. She was amenorrheic from 11/2017 - 10/15/2018. [place] and estradiol confirmed a premenopausalstate on 10/01/2018. Mensesrestarted on 10/15/2018.  She has iron deficiency.  She is off oral iron.  Ferritin was 21 on 02/14/2019 and 32 on 05/09/2019.  She was [facility] to [facility] from 01/08/2019 - 01/09/2019 for persistent epistaxis.HeadCTrevealed minimal mucosal thickening of the right sphenoid sinus and was otherwise normal. She underwent right nare airpacking. She is on doxycycline 123m x10 days.Etiology wasfelt secondary tohigh blood pressure. She was discharged on metoprolol.  Patient received the POnsetCOVID-19 vaccine on 08/24/2019 and 09/16/2019.   Symptomatically, she feels "good."  She denies any breast concerns.  Her dizziness has resolved. She has not had any hot flashes. She had a heavy menses in 06/2020.  Exam is unremarkable.  Plan: 1.   Labs today: CBC with diff, CMP, CA 27.29. 2.Stage IB multifocal LEFT breast cancer Clinically, she is doing well She iss/pprophylactic mastectomy with reconstruction on 12/09/2018. She underwent implant exchange on 03/31/2019. Sheis s/p 52weeks of HER-2/neu directed therapy (last 02/14/2019). She began tamoxifen on 10/01/2018.  Continue tamoxifen. 3.   Abnormal menses  She noted extremely heavy menses in 06/2020.    Check FSH and estradiol.  Last pelvic exam in 11/2019.    Follow-up with gynecology for yearly pelvic exam. 4.   RTC in 4 months for MD assess and labs (CBC with diff, CMP, CA 27.29).  I discussed the assessment and treatment plan with the patient.  The patient was provided an opportunity to ask questions and all were answered.  The patient agreed with the plan and demonstrated an understanding of the instructions.  The patient was advised to call back if the symptoms worsen or if the condition fails to improve as  anticipated.  I provided 24 minutes of face-to-face time during this this encounter and > 50% was spent counseling as documented under my assessment and plan.  An additional 5 minutes were spent reviewing her chart (Epic and Care Everywhere) including notes, labs, and imaging studies.    MLequita Asal MD, PhD    08/05/2020, 2:32 PM  I, EDe Burrs am acting as scribe for [employer] CMike Gip MD, PhD.  I, Meka Lewan C. CMike Gip MD, have reviewed the above documentation for accuracy and completeness, and I agree with the above.

## 2020-08-05 ENCOUNTER — Encounter: Payer: Self-pay | Admitting: Hematology and Oncology

## 2020-08-05 ENCOUNTER — Other Ambulatory Visit: Payer: Self-pay

## 2020-08-05 ENCOUNTER — Inpatient Hospital Stay: Payer: BC Managed Care – PPO | Attending: Hematology and Oncology | Admitting: Hematology and Oncology

## 2020-08-05 ENCOUNTER — Inpatient Hospital Stay: Payer: BC Managed Care – PPO

## 2020-08-05 VITALS — BP 108/74 | HR 77 | Temp 97.8°F | Resp 16 | Wt 163.8 lb

## 2020-08-05 DIAGNOSIS — Z923 Personal history of irradiation: Secondary | ICD-10-CM | POA: Diagnosis not present

## 2020-08-05 DIAGNOSIS — N926 Irregular menstruation, unspecified: Secondary | ICD-10-CM | POA: Diagnosis not present

## 2020-08-05 DIAGNOSIS — C50419 Malignant neoplasm of upper-outer quadrant of unspecified female breast: Secondary | ICD-10-CM | POA: Diagnosis not present

## 2020-08-05 DIAGNOSIS — Z800 Family history of malignant neoplasm of digestive organs: Secondary | ICD-10-CM | POA: Diagnosis not present

## 2020-08-05 DIAGNOSIS — C50212 Malignant neoplasm of upper-inner quadrant of left female breast: Secondary | ICD-10-CM | POA: Diagnosis not present

## 2020-08-05 DIAGNOSIS — Z8041 Family history of malignant neoplasm of ovary: Secondary | ICD-10-CM | POA: Diagnosis not present

## 2020-08-05 DIAGNOSIS — Z170 Estrogen receptor positive status [ER+]: Secondary | ICD-10-CM | POA: Diagnosis not present

## 2020-08-05 DIAGNOSIS — Z9221 Personal history of antineoplastic chemotherapy: Secondary | ICD-10-CM | POA: Insufficient documentation

## 2020-08-05 DIAGNOSIS — Z9012 Acquired absence of left breast and nipple: Secondary | ICD-10-CM | POA: Insufficient documentation

## 2020-08-05 DIAGNOSIS — Z1502 Genetic susceptibility to malignant neoplasm of ovary: Secondary | ICD-10-CM | POA: Diagnosis not present

## 2020-08-05 DIAGNOSIS — Z803 Family history of malignant neoplasm of breast: Secondary | ICD-10-CM | POA: Insufficient documentation

## 2020-08-05 DIAGNOSIS — Z79810 Long term (current) use of selective estrogen receptor modulators (SERMs): Secondary | ICD-10-CM | POA: Insufficient documentation

## 2020-08-05 DIAGNOSIS — Z1501 Genetic susceptibility to malignant neoplasm of breast: Secondary | ICD-10-CM | POA: Diagnosis not present

## 2020-08-05 LAB — COMPREHENSIVE METABOLIC PANEL
ALT: 20 U/L (ref 0–44)
AST: 27 U/L (ref 15–41)
Albumin: 4.3 g/dL (ref 3.5–5.0)
Alkaline Phosphatase: 43 U/L (ref 38–126)
Anion gap: 9 (ref 5–15)
BUN: 11 mg/dL (ref 6–20)
CO2: 26 mmol/L (ref 22–32)
Calcium: 9.1 mg/dL (ref 8.9–10.3)
Chloride: 102 mmol/L (ref 98–111)
Creatinine, Ser: 0.67 mg/dL (ref 0.44–1.00)
GFR, Estimated: >60 mL/min (ref >60)
Glucose, Bld: 95 mg/dL (ref 70–99)
Potassium: 3.4 mmol/L — ABNORMAL LOW (ref 3.5–5.1)
Sodium: 137 mmol/L (ref 135–145)
Total Bilirubin: 0.8 mg/dL (ref 0.3–1.2)
Total Protein: 7.6 g/dL (ref 6.5–8.1)

## 2020-08-05 LAB — CBC WITH DIFFERENTIAL/PLATELET
Abs Immature Granulocytes: 0.02 10*3/uL (ref 0.00–0.07)
Basophils Absolute: 0 10*3/uL (ref 0.0–0.1)
Basophils Relative: 1 %
Eosinophils Absolute: 0.1 10*3/uL (ref 0.0–0.5)
Eosinophils Relative: 2 %
HCT: 37.1 % (ref 36.0–46.0)
Hemoglobin: 12.3 g/dL (ref 12.0–15.0)
Immature Granulocytes: 0 %
Lymphocytes Relative: 33 %
Lymphs Abs: 1.8 10*3/uL (ref 0.7–4.0)
MCH: 28.7 pg (ref 26.0–34.0)
MCHC: 33.2 g/dL (ref 30.0–36.0)
MCV: 86.5 fL (ref 80.0–100.0)
Monocytes Absolute: 0.3 10*3/uL (ref 0.1–1.0)
Monocytes Relative: 6 %
Neutro Abs: 3.4 10*3/uL (ref 1.7–7.7)
Neutrophils Relative %: 58 %
Platelets: 210 10*3/uL (ref 150–400)
RBC: 4.29 MIL/uL (ref 3.87–5.11)
RDW: 13 % (ref 11.5–15.5)
WBC: 5.7 10*3/uL (ref 4.0–10.5)
nRBC: 0 % (ref 0.0–0.2)

## 2020-08-06 ENCOUNTER — Telehealth: Payer: Self-pay

## 2020-08-06 LAB — CANCER ANTIGEN 27.29: CA 27.29: 17.7 U/mL (ref 0.0–38.6)

## 2020-08-06 LAB — ESTRADIOL: Estradiol: 165 pg/mL

## 2020-08-06 LAB — FOLLICLE STIMULATING HORMONE: FSH: 17.5 m[IU]/mL

## 2020-08-06 NOTE — Telephone Encounter (Signed)
I spoke with patient in regards to her lab results showing she is pre menopausal.

## 2020-08-20 ENCOUNTER — Ambulatory Visit: Payer: BC Managed Care – PPO | Admitting: Plastic Surgery

## 2020-08-20 ENCOUNTER — Encounter: Payer: Self-pay | Admitting: Plastic Surgery

## 2020-08-20 ENCOUNTER — Other Ambulatory Visit: Payer: Self-pay

## 2020-08-20 VITALS — BP 133/84 | HR 69

## 2020-08-20 DIAGNOSIS — N651 Disproportion of reconstructed breast: Secondary | ICD-10-CM | POA: Diagnosis not present

## 2020-08-20 DIAGNOSIS — Z9012 Acquired absence of left breast and nipple: Secondary | ICD-10-CM | POA: Diagnosis not present

## 2020-08-20 DIAGNOSIS — C50419 Malignant neoplasm of upper-outer quadrant of unspecified female breast: Secondary | ICD-10-CM

## 2020-08-20 DIAGNOSIS — Z9011 Acquired absence of right breast and nipple: Secondary | ICD-10-CM

## 2020-08-20 NOTE — Progress Notes (Signed)
Patient ID: Denise [name], female    DOB: 12/13/1975, 45 y.o.   MRN: [id number]   Chief Complaint  Patient presents with  . Follow-up  . Breast Problem    Patient is a 45 year old female for a 1 year follow-up on her breast.  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  It was estrogen and progesterone positive.  She had 1 lymph node involved.  She has received radiation to the left breast.  She is 5 feet 7 inches tall.  She underwent bilateral mastectomies with reconstruction.  She has [place]or smooth round 535 cc implants and on both sides (03/2019).  She has undergone her first nipple areola tattoo placement.  She is very happy with her overall results.  She recognizes that the left breast is much tighter and perkier than the right and understands its due to the radiation.  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  She is otherwise been doing very well and not had any recent illnesses.  Her daughter had Covid but has recovered nicely.  I do not feel any lumps or bumps or concerning areas.  The skin is very thin on the left breast.  She notices a twinge every now and then laterally.  This is most likely capsule contracture and scarring from the radiation.  I do not feel any areas of concern.    Review of Systems  Constitutional: Negative.   HENT: Negative.   Eyes: Negative.   Respiratory: Negative.   Cardiovascular: Negative.   Gastrointestinal: Negative.   Endocrine: Negative.   Genitourinary: Negative.   Musculoskeletal: Negative.   Neurological: Negative.   Hematological: Negative.   Psychiatric/Behavioral: Negative.     Past Medical History:  Diagnosis Date  . Abnormal breast biopsy    [facility], [name]  . Breast cancer ([place]) 11/2017   left breast; ER/PR/Her2neu POS  . Family history of breast cancer   . Family history of ovarian cancer   . Fibroadenoma of breast, left 2000, 2016  . Genetic screening  08/26/2013   BRCA/BART negative/[facility], CHEK2 POS 2017  . Increased risk of breast cancer 2017   IBIS=47%  . Left breast lump 06/18/2017   Fluid/drained [name]  . Migraine   . Monoallelic mutation of CHEK2 gene in female patient 2017   increased risk of breast and colon cancer  . Personal history of chemotherapy   . Personal history of radiation therapy     Past Surgical History:  Procedure Laterality Date  . BREAST BIOPSY Bilateral 09/08/14   neg  . BREAST BIOPSY Left 03/16/2015   Procedure: BREAST BIOPSY WITH NEEDLE LOCALIZATION;  Surgeon: [name], MD;  Location: [facility];  Service: General;  Laterality: Left;  . BREAST BIOPSY Left 11/2017   positive  . BREAST CYST ASPIRATION Left 06/18/2017   cyst aspiration  . BREAST EXCISIONAL BIOPSY Left 09/2014  . BREAST EXCISIONAL BIOPSY Right    age 16's  . BREAST RECONSTRUCTION WITH PLACEMENT OF TISSUE EXPANDER AND FLEX HD (ACELLULAR HYDRATED DERMIS) Left 05/20/2018   Procedure: BREAST RECONSTRUCTION WITH PLACEMENT OF TISSUE EXPANDER AND FLEX HD (ACELLULAR HYDRATED DERMIS);  Surgeon: [name], DO;  Location: [facility];  Service: Plastics;  Laterality: Left;  . BREAST RECONSTRUCTION WITH PLACEMENT OF TISSUE EXPANDER AND FLEX HD (ACELLULAR HYDRATED DERMIS) Right 12/09/2018   Procedure: BREAST RECONSTRUCTION WITH PLACEMENT OF TISSUE EXPANDER AND FLEX HD (  ACELLULAR HYDRATED DERMIS);  Surgeon: [name], DO;  Location: [facility];  Service: Plastics;  Laterality: Right;  . BREAST SURGERY Right March 2000   benign fibroadenoma  . BREAST SURGERY Left 08/08/13   excision  . BREAST SURGERY Left 09/23/14   excision  . ESOPHAGOGASTRODUODENOSCOPY (EGD) WITH PROPOFOL N/A 01/20/2019   Procedure: ESOPHAGOGASTRODUODENOSCOPY (EGD) WITH PROPOFOL;  Surgeon: [name], MD;  Location: [facility];  Service: Gastroenterology;  Laterality: N/A;  . MASTECTOMY Left 05/20/2018  . MASTECTOMY W/ SENTINEL NODE BIOPSY Left  05/20/2018   Procedure: MASTECTOMY WITH SENTINEL LYMPH NODE BIOPSY;  Surgeon: [name], MD;  Location: [facility];  Service: General;  Laterality: Left;  . PORT-A-CATH REMOVAL Right 03/31/2019   Procedure: REMOVAL PORT-A-CATH;  Surgeon: [name], DO;  Location: [facility];  Service: Plastics;  Laterality: Right;  . PORTACATH PLACEMENT Right 12/17/2017   Procedure: INSERTION PORT-A-CATH;  Surgeon: [name], MD;  Location: [facility];  Service: General;  Laterality: Right;  . REMOVAL OF BILATERAL TISSUE EXPANDERS WITH PLACEMENT OF BILATERAL BREAST IMPLANTS Bilateral 03/31/2019   Procedure: REMOVAL OF BILATERAL TISSUE EXPANDERS WITH PLACEMENT OF BILATERAL BREAST IMPLANTS;  Surgeon: [name], DO;  Location: [facility];  Service: Plastics;  Laterality: Bilateral;  2 hours  . SIMPLE MASTECTOMY WITH AXILLARY SENTINEL NODE BIOPSY Right 12/09/2018   Procedure: SIMPLE MASTECTOMY RIGHT;  Surgeon: [name], MD;  Location: [facility];  Service: General;  Laterality: Right;      Current Outpatient Medications:  .  acetaminophen (TYLENOL) 500 MG tablet, Take 1,000 mg by mouth every 6 (six) hours as needed for moderate pain or headache. , Disp: , Rfl:  .  loratadine (CLARITIN) 10 MG tablet, Take 10 mg by mouth daily., Disp: , Rfl:  .  tamoxifen (NOLVADEX) 20 MG tablet, Take 1 tablet (20 mg total) by mouth daily., Disp: 90 tablet, Rfl: 0 .  vitamin C (ASCORBIC ACID) 500 MG tablet, Take 500 mg by mouth daily., Disp: , Rfl:   Current Facility-Administered Medications:  .  paragard intrauterine copper IUD, , Intrauterine, Once, Copland, Alicia B, PA-C  Facility-Administered Medications Ordered in Other Visits:  .  sodium chloride flush (NS) 0.9 % injection 10 mL, 10 mL, Intravenous, PRN, Mike Gip, Melissa C, MD, 10 mL at 04/17/18 0844   Objective:   Vitals:   08/20/20 0803  BP: 133/84  Pulse: 69  SpO2: 100%    Physical Exam Vitals and nursing note reviewed.   Constitutional:      Appearance: Normal appearance.  HENT:     Head: Normocephalic and atraumatic.  Cardiovascular:     Rate and Rhythm: Normal rate.     Pulses: Normal pulses.  Pulmonary:     Effort: Pulmonary effort is normal. No respiratory distress.  Musculoskeletal:        General: No swelling, deformity or signs of injury.  Skin:    General: Skin is warm.     Capillary Refill: Capillary refill takes less than 2 seconds.     Coloration: Skin is not jaundiced.     Findings: No bruising.  Neurological:     General: No focal deficit present.     Mental Status: She is alert and oriented to person, place, and time.  Psychiatric:        Mood and Affect: Mood normal.        Behavior: Behavior normal.        Thought Content: Thought content normal.  Assessment & Plan:  Acquired absence of right breast  Acquired absence of left breast  Breast asymmetry following reconstructive surgery  Malignant neoplasm of upper-outer quadrant of female breast, unspecified estrogen receptor status, unspecified laterality ([place])  It was great to see [place] today.  I am so glad she is doing well.  We talked about ultrasound evaluation of the implants if needed and eligible for every 3 years.  We will review that again at her next visit.  She will plan to do her second nipple areola tattooing at spring break.  She would like to call us when she has her schedule down.  Continue with self exams and follow-up in 1 year.  Pictures were obtained of the patient and placed in the chart with the patient's or guardian's permission.   [place], DO

## 2020-08-24 ENCOUNTER — Encounter: Payer: Self-pay | Admitting: Hematology and Oncology

## 2020-08-24 ENCOUNTER — Other Ambulatory Visit: Payer: Self-pay

## 2020-08-24 DIAGNOSIS — C50419 Malignant neoplasm of upper-outer quadrant of unspecified female breast: Secondary | ICD-10-CM

## 2020-08-24 MED ORDER — TAMOXIFEN CITRATE 20 MG PO TABS: 20.0000 mg | ORAL_TABLET | Freq: Every day | ORAL | 0 refills | Status: DC

## 2020-10-19 ENCOUNTER — Encounter: Payer: Self-pay | Admitting: Obstetrics and Gynecology

## 2020-10-19 DIAGNOSIS — N921 Excessive and frequent menstruation with irregular cycle: Secondary | ICD-10-CM

## 2020-10-19 DIAGNOSIS — Z30431 Encounter for routine checking of intrauterine contraceptive device: Secondary | ICD-10-CM

## 2020-10-19 DIAGNOSIS — Z79810 Long term (current) use of selective estrogen receptor modulators (SERMs): Secondary | ICD-10-CM

## 2020-11-18 ENCOUNTER — Encounter: Payer: Self-pay | Admitting: Obstetrics and Gynecology

## 2020-11-19 ENCOUNTER — Ambulatory Visit: Payer: BC Managed Care – PPO

## 2020-11-19 NOTE — Telephone Encounter (Signed)
Pls sched u/s at [facility] for transvag only. Thx.

## 2020-11-26 ENCOUNTER — Other Ambulatory Visit: Payer: Self-pay | Admitting: Oncology

## 2020-11-26 DIAGNOSIS — C50419 Malignant neoplasm of upper-outer quadrant of unspecified female breast: Secondary | ICD-10-CM

## 2020-11-29 ENCOUNTER — Encounter: Payer: Self-pay | Admitting: Internal Medicine

## 2020-11-29 ENCOUNTER — Ambulatory Visit: Payer: BC Managed Care – PPO

## 2020-11-29 ENCOUNTER — Telehealth: Payer: Self-pay | Admitting: *Deleted

## 2020-11-29 ENCOUNTER — Other Ambulatory Visit: Payer: Self-pay

## 2020-11-29 ENCOUNTER — Ambulatory Visit (INDEPENDENT_AMBULATORY_CARE_PROVIDER_SITE_OTHER): Payer: BC Managed Care – PPO

## 2020-11-29 VITALS — BP 126/82 | HR 73 | Temp 98.3°F | Ht 67.0 in | Wt 160.0 lb

## 2020-11-29 DIAGNOSIS — Z9889 Other specified postprocedural states: Secondary | ICD-10-CM

## 2020-11-29 DIAGNOSIS — Z9013 Acquired absence of bilateral breasts and nipples: Secondary | ICD-10-CM

## 2020-11-29 DIAGNOSIS — C50419 Malignant neoplasm of upper-outer quadrant of unspecified female breast: Secondary | ICD-10-CM

## 2020-11-29 MED ORDER — TAMOXIFEN CITRATE 20 MG PO TABS: 20.0000 mg | ORAL_TABLET | Freq: Every day | ORAL | 0 refills | Status: DC

## 2020-11-29 NOTE — Telephone Encounter (Signed)
Refill sent.

## 2020-12-06 ENCOUNTER — Other Ambulatory Visit: Payer: BC Managed Care – PPO

## 2020-12-06 ENCOUNTER — Ambulatory Visit: Payer: BC Managed Care – PPO | Admitting: Hematology and Oncology

## 2020-12-17 ENCOUNTER — Ambulatory Visit: Payer: BC Managed Care – PPO | Admitting: Internal Medicine

## 2020-12-17 ENCOUNTER — Other Ambulatory Visit: Payer: BC Managed Care – PPO

## 2020-12-19 NOTE — Patient Instructions (Signed)
Reviewed post-procedure instruction: Vaseline/xeroform & gauze dressing until areas heal. She understands that d/t fading- there may be need for further touch-up sessions. She will call for any concerns.

## 2020-12-19 NOTE — Progress Notes (Signed)
NIPPLE AREOLAR TATTOO PROCEDURE  PREOPERATIVE DIAGNOSIS:  Acquired absence of BILATERAL nipple areolar   POSTOPERATIVE DIAGNOSIS: Acquired absence of BILATERAL nipple areolar    PROCEDURES: BILATERAL nipple areolar tattoo touch-up  ATTENDING SURGEON: Dr. Lyndee Leo Dillingham  ANESTHESIA:  EMLA  COMPLICATIONS: None.  JUSTIFICATION FOR PROCEDURE:  Denise [name] is a 45 y.o. female with a history of breast cancer status post bilateral breast reconstruction. The patient presents for nipple areolar complex tattoo touch-up. Risks, benefits, indications, and alternatives of the above described procedures were discussed with the patient and all the patient's questions were answered.   DESCRIPTION OF PROCEDURE: After informed consent was obtained and proper identification of patient and surgical site was made, the patient was taken to the procedure room and pre-procedure photos taken & entered into chart. Patient was then placed supine on the operating room table. A time out was performed to confirm patient's identity and surgical site. The patient was prepped and draped in the usual sterile fashion.  Using a # 7 pronged tattoo head, pigment was instilled to the designed nipple areolar complexes.   Once adequate pigment had been applied to the nipple areolar complex, a post-procedure photo taken. Vaseline/xeroform & gauze dressing was applied. The patient tolerated the procedure well.   [employer]k used: [employer] / [employer] Skin / Cool Honey / Bright Peach / Cool Mink Ultra Duration Lot #'s & exp dates on file

## 2021-01-03 ENCOUNTER — Telehealth: Payer: Self-pay | Admitting: Obstetrics and Gynecology

## 2021-01-04 ENCOUNTER — Encounter: Payer: Self-pay | Admitting: Internal Medicine

## 2021-01-04 ENCOUNTER — Other Ambulatory Visit: Payer: Self-pay

## 2021-01-04 ENCOUNTER — Inpatient Hospital Stay: Payer: BC Managed Care – PPO

## 2021-01-04 ENCOUNTER — Inpatient Hospital Stay: Payer: BC Managed Care – PPO | Attending: Hematology and Oncology | Admitting: Internal Medicine

## 2021-01-04 VITALS — BP 116/90 | HR 83 | Temp 97.7°F | Resp 16 | Ht 67.0 in | Wt 169.8 lb

## 2021-01-04 DIAGNOSIS — Z975 Presence of (intrauterine) contraceptive device: Secondary | ICD-10-CM | POA: Insufficient documentation

## 2021-01-04 DIAGNOSIS — Z9221 Personal history of antineoplastic chemotherapy: Secondary | ICD-10-CM | POA: Diagnosis not present

## 2021-01-04 DIAGNOSIS — Z170 Estrogen receptor positive status [ER+]: Secondary | ICD-10-CM | POA: Insufficient documentation

## 2021-01-04 DIAGNOSIS — Z9012 Acquired absence of left breast and nipple: Secondary | ICD-10-CM | POA: Diagnosis not present

## 2021-01-04 DIAGNOSIS — Z923 Personal history of irradiation: Secondary | ICD-10-CM | POA: Insufficient documentation

## 2021-01-04 DIAGNOSIS — Z800 Family history of malignant neoplasm of digestive organs: Secondary | ICD-10-CM | POA: Diagnosis not present

## 2021-01-04 DIAGNOSIS — R232 Flushing: Secondary | ICD-10-CM | POA: Insufficient documentation

## 2021-01-04 DIAGNOSIS — C50419 Malignant neoplasm of upper-outer quadrant of unspecified female breast: Secondary | ICD-10-CM

## 2021-01-04 DIAGNOSIS — D500 Iron deficiency anemia secondary to blood loss (chronic): Secondary | ICD-10-CM

## 2021-01-04 DIAGNOSIS — C50412 Malignant neoplasm of upper-outer quadrant of left female breast: Secondary | ICD-10-CM | POA: Diagnosis present

## 2021-01-04 DIAGNOSIS — Z803 Family history of malignant neoplasm of breast: Secondary | ICD-10-CM | POA: Diagnosis not present

## 2021-01-04 DIAGNOSIS — Z79810 Long term (current) use of selective estrogen receptor modulators (SERMs): Secondary | ICD-10-CM | POA: Insufficient documentation

## 2021-01-04 DIAGNOSIS — Z8041 Family history of malignant neoplasm of ovary: Secondary | ICD-10-CM | POA: Diagnosis not present

## 2021-01-04 LAB — COMPREHENSIVE METABOLIC PANEL
ALT: 17 U/L (ref 0–44)
AST: 20 U/L (ref 15–41)
Albumin: 4.3 g/dL (ref 3.5–5.0)
Alkaline Phosphatase: 43 U/L (ref 38–126)
Anion gap: 7 (ref 5–15)
BUN: 14 mg/dL (ref 6–20)
CO2: 28 mmol/L (ref 22–32)
Calcium: 9.5 mg/dL (ref 8.9–10.3)
Chloride: 103 mmol/L (ref 98–111)
Creatinine, Ser: 0.71 mg/dL (ref 0.44–1.00)
GFR, Estimated: >60 mL/min (ref >60)
Glucose, Bld: 103 mg/dL — ABNORMAL HIGH (ref 70–99)
Potassium: 4.1 mmol/L (ref 3.5–5.1)
Sodium: 138 mmol/L (ref 135–145)
Total Bilirubin: 1 mg/dL (ref 0.3–1.2)
Total Protein: 7.7 g/dL (ref 6.5–8.1)

## 2021-01-04 LAB — CBC WITH DIFFERENTIAL/PLATELET
Abs Immature Granulocytes: 0.02 10*3/uL (ref 0.00–0.07)
Basophils Absolute: 0 10*3/uL (ref 0.0–0.1)
Basophils Relative: 1 %
Eosinophils Absolute: 0.1 10*3/uL (ref 0.0–0.5)
Eosinophils Relative: 2 %
HCT: 36.9 % (ref 36.0–46.0)
Hemoglobin: 12.4 g/dL (ref 12.0–15.0)
Immature Granulocytes: 0 %
Lymphocytes Relative: 27 %
Lymphs Abs: 1.4 10*3/uL (ref 0.7–4.0)
MCH: 28.8 pg (ref 26.0–34.0)
MCHC: 33.6 g/dL (ref 30.0–36.0)
MCV: 85.6 fL (ref 80.0–100.0)
Monocytes Absolute: 0.3 10*3/uL (ref 0.1–1.0)
Monocytes Relative: 7 %
Neutro Abs: 3.3 10*3/uL (ref 1.7–7.7)
Neutrophils Relative %: 63 %
Platelets: 225 10*3/uL (ref 150–400)
RBC: 4.31 MIL/uL (ref 3.87–5.11)
RDW: 12.8 % (ref 11.5–15.5)
WBC: 5.2 10*3/uL (ref 4.0–10.5)
nRBC: 0 % (ref 0.0–0.2)

## 2021-01-04 NOTE — Progress Notes (Signed)
Concerned about tingling sensation that has occurred in the last several months in her fingers. Has problems with sleep and wanted to talk about that.

## 2021-01-04 NOTE — Progress Notes (Signed)
[name] NOTE  Patient Care Team: Venetia Maxon, Rolanda Jay, PA-C as PCP - General (Family Medicine) Bary Castilla, Forest Gleason, MD (General Surgery) Gae Dry, MD as Referring Physician (Obstetrics and Gynecology) Lequita Asal, MD as Referring Physician (Hematology and Oncology) Rico Junker, RN as Oncology Nurse Navigator Noreene Filbert, MD as Referring Physician (Radiation Oncology) [place] Pink, MD as Consulting Physician (Family Medicine)  CHIEF COMPLAINTS/PURPOSE OF CONSULTATION: Breast cancer  #  Oncology History Overview Note  Denise [name] is a 45 y.o. 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  Pathology revealed no residual carcinoma in the breast.  One of three sentinel lymph nodes were positive.  One lymph node had 2 metastatic foci (2.1 mm and 1 mm).  Pathologic stage revealed ypT0 ypN1a.   Index breast mass and axillary node biopsy on 12/06/2017 revealed grade II invasive ductal carcinoma with calcifications at the 11 o'clock position.  There was metastatic carcinoma in 1 of 1 lymph nodes.  Tumor was ER + (100%), PR + (100%), and Ki67 5%.  Her2/neu was 3+ by IHC (heterogeneous) and FISH +.   Diagnostic left mammogram and ultrasound on 12/06/2017 revealed a 1.7 x 1.3 x 1.3 cm irregular hypoechoic mass with internal calcifications at the 11 o'clock position 2 cm from the nipple with internal calcifications. There was a similar appearing 0.9 x 0.8 x 0.7 cm mass at the 11 o'clock position 4 cm from the nipple (2.4 cm from the index lesion).  There were suspicious, segmental calcifications originating from the index lesion and extending 5 cm posteriorly.  There was a 0.6 cm morphologically abnormal left axillary lymph node.   Bilateral breast MRI on 12/10/2017 revealed a papilloma in the right breast  The recently biopsied  malignancy in the left breast (15 x 16 x 16 mm) was identified. The adjacent and more superiorly located 11 o'clock mass (7 x 12 mm) seen on recent ultrasound, 4 cm from the nipple on ultrasound, was also identified. There were 2 probable satellite lesions (7 mm, medial lesion; posterior and lateral lesion). The total span of malignancy was 3 x 3.1 x 4.5 cm.  There were calcifications extending up to 5 cm posterior to the biopsied malignancy which were highly suspicious on mammography but not appreciated.  There was a mass in the lower outer left breast measured 5 mm, representing a change.  The known metastatic node in the left axilla was identified. A node along the posterolateral margin of the pectoralis minor (cortex 5 mm) was nonspecific but at least somewhat suspicious.   Chest, abdomen, and pelvic CT on 12/19/2017 revealed a solitary enlarged biopsy-proven metastatic left axillary lymph node.  There were no additional findings suspicious for metastatic disease in the chest, abdomen or pelvis.  There was a nonspecific tiny sclerotic upper right sacral lesion, more likely a benign bone island. Bone scintigraphy correlation versus follow-up CT could be considered.   Bone scan on 01/11/2018 was negative for metastatic pattern. No areas of focal tracer uptake. Area of concern in sacrum on CT likely represents benign bone island.    [facility] genetic testing on 08/26/2013 was negative for BRCA1 and 2. 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  She has a family history significant for breast, ovarian, pancreatic, and prostate cancer.   She received 6 cycles of TCHP chemotherapy (12/21/2017 - 04/17/2018) with Margarette Canada support.  Cycle #3 was held due to elevated LFTs on 02/04/2018. She received Herceptin + Perjeta alone (04/30/2018 - 05/30/2018).  She is s/p cycle #1 Kadcyla (06/20/2018).   She began breast radiation on 06/25/2018   Echocardiograms:  12/20/2017 - EF of 60-65%,  03/27/2018 - EF of 60-65%, and 06/18/2018 - EF of 55-60%.   She has a history of elevated LFTs.  Hepatitis B and C serologies were negative on 02/04/2018.  RUQ ultrasound on 02/06/2018 revealed a small anterior gallbladder wall polyp. There was no evidence of cholelithiasis or cholecystis. CBD measured normal at 2 mm, with no evidence of choledocholithiasis. There was normal direction of blood flow towards the liver noted.    Carcinoma of upper-outer quadrant of left breast in female, estrogen receptor positive ([place])  09/17/2014 Initial Diagnosis   Malignant neoplasm of upper-outer quadrant of female breast ([place])    12/21/2017 - 04/18/2018 Chemotherapy   The patient had dexamethasone (DECADRON) 4 MG tablet, 8 mg, Oral, 2 times daily, 2 of 2 cycles palonosetron (ALOXI) injection 0.25 mg, 0.25 mg, Intravenous,  Once, 6 of 6 cycles Administration: 0.25 mg (12/21/2017), 0.25 mg (01/14/2018), 0.25 mg (03/28/2018), 0.25 mg (04/17/2018), 0.25 mg (02/11/2018), 0.25 mg (03/07/2018) pegfilgrastim (NEULASTA ONPRO KIT) injection 6 mg, 6 mg, Subcutaneous, Once, 1 of 1 cycle Administration: 6 mg (12/21/2017) pegfilgrastim-cbqv (UDENYCA) injection 6 mg, 6 mg, Subcutaneous, Once, 6 of 6 cycles Administration: 6 mg (01/15/2018), 6 mg (03/29/2018), 6 mg (04/18/2018), 6 mg (02/12/2018), 6 mg (03/08/2018) trastuzumab (HERCEPTIN) 600 mg in sodium chloride 0.9 % 250 mL chemo infusion, 672 mg, Intravenous,  Once, 6 of 6 cycles Administration: 600 mg (12/21/2017), 450 mg (01/14/2018), 500 mg (03/28/2018), 500 mg (04/17/2018), 500 mg (02/11/2018), 500 mg (03/07/2018) CARBOplatin (PARAPLATIN) 870 mg in sodium chloride 0.9 % 500 mL chemo infusion, 890 mg (100 % of original dose 885 mg), Intravenous,  Once, 6 of 6 cycles Dose modification:   (original dose 885 mg, Cycle 1),   (original dose 885 mg, Cycle 2),   (original dose 885 mg, Cycle 5),   (original dose 885 mg, Cycle 6),   (original dose 885 mg, Cycle 3),   (original dose 885 mg,  Cycle 4) Administration: 870 mg (12/21/2017), 870 mg (01/14/2018), 870 mg (03/28/2018), 870 mg (04/17/2018), 870 mg (02/11/2018), 870 mg (03/07/2018) DOCEtaxel (TAXOTERE) 150 mg in sodium chloride 0.9 % 250 mL chemo infusion, 75 mg/m2 = 150 mg, Intravenous,  Once, 6 of 6 cycles Dose modification: 55 mg/m2 (original dose 75 mg/m2, Cycle 5, Reason: Change in LFTs, Comment: per pharmacy) Administration: 150 mg (12/21/2017), 150 mg (01/14/2018), 110 mg (03/28/2018), 150 mg (04/17/2018), 150 mg (02/11/2018), 150 mg (03/07/2018) pertuzumab (PERJETA) 840 mg in sodium chloride 0.9 % 250 mL chemo infusion, 840 mg, Intravenous, Once, 6 of 6 cycles Administration: 840 mg (12/21/2017), 420 mg (01/14/2018), 420 mg (03/28/2018), 420 mg (04/17/2018), 420 mg (02/11/2018), 420 mg (03/07/2018) fosaprepitant (EMEND) 150 mg, dexamethasone (DECADRON) 12 mg in sodium chloride 0.9 % 145 mL IVPB, , Intravenous,  Once, 6 of 6 cycles Administration:  (12/21/2017),  (01/14/2018),  (03/28/2018),  (04/17/2018),  (02/11/2018),  (03/07/2018)   for chemotherapy treatment.     05/09/2018 - 05/30/2018 Chemotherapy   The patient had trastuzumab (HERCEPTIN) 500 mg in sodium chloride 0.9 % 250 mL chemo infusion, 504 mg, Intravenous,  Once, 2 of 11 cycles Administration: 500 mg (05/30/2018), 500 mg (05/09/2018) pertuzumab (PERJETA) 420 mg in sodium chloride 0.9 % 250 mL chemo infusion, 420 mg, Intravenous, Once, 2 of  11 cycles Administration: 420 mg (05/30/2018), 420 mg (05/09/2018)   for chemotherapy treatment.     01/17/2019 -  Chemotherapy   The patient had trastuzumab-anns (KANJINTI) 500 mg in sodium chloride 0.9 % 250 mL chemo infusion, 441 mg (100 % of original dose 6 mg/kg), Intravenous,  Once, 3 of 3 cycles Dose modification: 6 mg/kg (original dose 6 mg/kg, Cycle 1, Reason: Provider Judgment), 2 mg/kg (original dose 6 mg/kg, Cycle 3, Reason: Provider Judgment, Comment: 1 week short of 52 weeks of Her2/neu based treatment) Administration: 500  mg (01/24/2019), 150 mg (02/14/2019)   for chemotherapy treatment.        HISTORY OF PRESENTING ILLNESS:  Denise [name] 45 y.o.  female with a history of stage Ib ER/PR positive HER2/neu positive breast cancer currently on tamoxifen is here for follow-up.  Patient denies any new onset of bone pain or joint pains.  Denies any chest pain or shortness of breath or cough.  No nausea no vomiting.  No headaches.  She has intermittent hot flashes.  Review of Systems  Constitutional:  Negative for chills, diaphoresis, fever, malaise/fatigue and weight loss.  HENT:  Negative for nosebleeds and sore throat.   Eyes:  Negative for double vision.  Respiratory:  Negative for cough, hemoptysis, sputum production, shortness of breath and wheezing.   Cardiovascular:  Negative for chest pain, palpitations, orthopnea and leg swelling.  Gastrointestinal:  Negative for abdominal pain, blood in stool, constipation, diarrhea, heartburn, melena, nausea and vomiting.  Genitourinary:  Negative for dysuria, frequency and urgency.  Musculoskeletal:  Negative for back pain and joint pain.  Skin: Negative.  Negative for itching and rash.  Neurological:  Negative for dizziness, tingling, focal weakness, weakness and headaches.  Endo/Heme/Allergies:  Does not bruise/bleed easily.  Psychiatric/Behavioral:  Negative for depression. The patient is not nervous/anxious and does not have insomnia.     MEDICAL HISTORY:  Past Medical History:  Diagnosis Date   Abnormal breast biopsy    [facility], [name]   Breast cancer [facility]) 11/2017   left breast; ER/PR/Her2neu POS   Family history of breast cancer    Family history of ovarian cancer    Fibroadenoma of breast, left 2000, 2016   Genetic screening 08/26/2013   BRCA/BART negative/Myriad, CHEK2 POS 2017   Increased risk of breast cancer 2017   IBIS=47%   Left breast lump 06/18/2017   Fluid/drained [name]   Migraine    Monoallelic mutation of CHEK2 gene in  female patient 2017   increased risk of breast and colon cancer   Personal history of chemotherapy    Personal history of radiation therapy     SURGICAL HISTORY: Past Surgical History:  Procedure Laterality Date   BREAST BIOPSY Bilateral 09/08/14   neg   BREAST BIOPSY Left 03/16/2015   Procedure: BREAST BIOPSY WITH NEEDLE LOCALIZATION;  Surgeon: [name], MD;  Location: [facility];  Service: General;  Laterality: Left;   BREAST BIOPSY Left 11/2017   positive   BREAST CYST ASPIRATION Left 06/18/2017   cyst aspiration   BREAST EXCISIONAL BIOPSY Left 09/2014   BREAST EXCISIONAL BIOPSY Right    age 38's   BREAST RECONSTRUCTION WITH PLACEMENT OF TISSUE EXPANDER AND FLEX HD (ACELLULAR HYDRATED DERMIS) Left 05/20/2018   Procedure: BREAST RECONSTRUCTION WITH PLACEMENT OF TISSUE EXPANDER AND FLEX HD (ACELLULAR HYDRATED DERMIS);  Surgeon: [name], DO;  Location: [facility];  Service: Plastics;  Laterality: Left;   BREAST RECONSTRUCTION WITH PLACEMENT OF TISSUE EXPANDER  AND FLEX HD (ACELLULAR HYDRATED DERMIS) Right 12/09/2018   Procedure: BREAST RECONSTRUCTION WITH PLACEMENT OF TISSUE EXPANDER AND FLEX HD (ACELLULAR HYDRATED DERMIS);  Surgeon: [name], DO;  Location: [facility];  Service: Plastics;  Laterality: Right;   BREAST SURGERY Right March 2000   benign fibroadenoma   BREAST SURGERY Left 08/08/13   excision   BREAST SURGERY Left 09/23/14   excision   ESOPHAGOGASTRODUODENOSCOPY (EGD) WITH PROPOFOL N/A 01/20/2019   Procedure: ESOPHAGOGASTRODUODENOSCOPY (EGD) WITH PROPOFOL;  Surgeon: [name], MD;  Location: [place];  Service: Gastroenterology;  Laterality: N/A;   MASTECTOMY Left 05/20/2018   MASTECTOMY W/ SENTINEL NODE BIOPSY Left 05/20/2018   Procedure: MASTECTOMY WITH SENTINEL LYMPH NODE BIOPSY;  Surgeon: [name], MD;  Location: [facility];  Service: General;  Laterality: Left;   PORT-A-CATH REMOVAL Right 03/31/2019   Procedure: REMOVAL  PORT-A-CATH;  Surgeon: [name], DO;  Location: [facility];  Service: Plastics;  Laterality: Right;   PORTACATH PLACEMENT Right 12/17/2017   Procedure: INSERTION PORT-A-CATH;  Surgeon: [name], MD;  Location: [facility];  Service: General;  Laterality: Right;   REMOVAL OF BILATERAL TISSUE EXPANDERS WITH PLACEMENT OF BILATERAL BREAST IMPLANTS Bilateral 03/31/2019   Procedure: REMOVAL OF BILATERAL TISSUE EXPANDERS WITH PLACEMENT OF BILATERAL BREAST IMPLANTS;  Surgeon: [name], DO;  Location: [facility];  Service: Plastics;  Laterality: Bilateral;  2 hours   SIMPLE MASTECTOMY WITH AXILLARY SENTINEL NODE BIOPSY Right 12/09/2018   Procedure: SIMPLE MASTECTOMY RIGHT;  Surgeon: [name], MD;  Location: [facility];  Service: General;  Laterality: Right;    SOCIAL HISTORY: Social History   Socioeconomic History   Marital status: Married    Spouse name: Not on file   Number of children: Not on file   Years of education: Not on file   Highest education level: Not on file  Occupational History   Not on file  Tobacco Use   Smoking status: Never   Smokeless tobacco: Never  Vaping Use   Vaping Use: Never used  Substance and Sexual Activity   Alcohol use: Yes    Comment: occasionally   Drug use: No   Sexual activity: Yes    Birth control/protection: I.U.D.    Comment: Paragard  Other Topics Concern   Not on file  Social History Narrative   Not on file   Social Determinants of Health   Financial Resource Strain: Not on file  Food Insecurity: Not on file  Transportation Needs: Not on file  Physical Activity: Not on file  Stress: Not on file  Social Connections: Not on file  Intimate Partner Violence: Not on file    FAMILY HISTORY: Family History  Problem Relation Age of Onset   Prostate cancer Father 53       requiring tx/hormones   Breast cancer Paternal Grandmother 67   Breast cancer Maternal Aunt 60   Ovarian cancer Maternal Aunt 50   Breast  cancer Paternal Aunt 21   Pancreatic cancer Maternal Aunt 75    ALLERGIES:  is allergic to steri-strip compound benzoin [benzoin compound] and tape.  MEDICATIONS:  Current Outpatient Medications  Medication Sig Dispense Refill   acetaminophen (TYLENOL) 500 MG tablet Take 1,000 mg by mouth every 6 (six) hours as needed for moderate pain or headache.      cholecalciferol (VITAMIN D3) 25 MCG (1000 UNIT) tablet Take 1,000 Units by mouth daily.     tamoxifen (NOLVADEX) 20 MG tablet Take 1 tablet (  20 mg total) by mouth daily. 90 tablet 0   vitamin C (ASCORBIC ACID) 500 MG tablet Take 500 mg by mouth daily.     Current Facility-Administered Medications  Medication Dose Route Frequency Provider Last Rate Last Admin   paragard intrauterine copper IUD   Intrauterine Once Copland, Alicia B, PA-C       Facility-Administered Medications Ordered in Other Visits  Medication Dose Route Frequency Provider Last Rate Last Admin   sodium chloride flush (NS) 0.9 % injection 10 mL  10 mL Intravenous PRN Lequita Asal, MD   10 mL at 04/17/18 0844      .  PHYSICAL EXAMINATION: ECOG PERFORMANCE STATUS: 0 - Asymptomatic  Vitals:   01/04/21 1020  BP: 116/90  Pulse: 83  Resp: 16  Temp: 97.7 F (36.5 C)  SpO2: 100%   Filed Weights   01/04/21 1020  Weight: 169 lb 12.1 oz (77 kg)    Physical Exam Vitals and nursing note reviewed.  Constitutional:      Comments: Alone; ambulating independently  HENT:     Head: Normocephalic and atraumatic.     Mouth/Throat:     Mouth: Mucous membranes are moist.     Pharynx: Oropharynx is clear. No oropharyngeal exudate.  Eyes:     Extraocular Movements: Extraocular movements intact.     Pupils: Pupils are equal, round, and reactive to light.  Cardiovascular:     Rate and Rhythm: Normal rate and regular rhythm.  Pulmonary:     Effort: No respiratory distress.     Breath sounds: No wheezing.  Abdominal:     General: Bowel sounds are normal. There is  no distension.     Palpations: Abdomen is soft. There is no mass.     Tenderness: There is no abdominal tenderness. There is no guarding or rebound.  Musculoskeletal:        General: No tenderness. Normal range of motion.     Cervical back: Normal range of motion and neck supple.  Skin:    General: Skin is warm.  Neurological:     General: No focal deficit present.     Mental Status: She is alert and oriented to person, place, and time.  Psychiatric:        Mood and Affect: Affect normal.        Behavior: Behavior normal.        Judgment: Judgment normal.     LABORATORY DATA:  I have reviewed the data as listed Lab Results  Component Value Date   WBC 5.2 01/04/2021   HGB 12.4 01/04/2021   HCT 36.9 01/04/2021   MCV 85.6 01/04/2021   PLT 225 01/04/2021   Recent Labs    04/08/20 1351 08/05/20 1329 01/04/21 1005  NA 138 137 138  K 3.8 3.4* 4.1  CL 101 102 103  CO2 _0 GLUCOSE 76 95 103*  BUN _1 CREATININE 0.81 0.67 0.71  CALCIUM 8.9 9.1 9.5  GFRNONAA >60 >60 >60  PROT 7.4 7.6 7.7  ALBUMIN 4.2 4.3 4.3  AST _2 ALT _3 ALKPHOS 40 43 43  BILITOT 0.9 0.8 1.0    RADIOGRAPHIC STUDIES: I have personally reviewed the radiological images as listed and agreed with the findings in the report. No results found.  ASSESSMENT & PLAN:   Carcinoma of upper-outer quadrant of left breast in female, estrogen receptor positive ([place]) # Stage IB multifocal LEFT breast cancer ER-positive; Her2  positive- on Tamoxifen.  S/p left mastectomy; right prophylactic mastectomy; .  Clinically stable.  No evidence of recurrence.  Await tumor marker today.  #Patient currently on tamoxifen.  Tolerating fairly well except for mild hot flashes. ? OS/?Zometa  # Hot flashes: G-1 [sec to summer]-stable.  Hold off any pharmacotherapy.  #  Premenopausal [July 1483]-GNPHQN ultrasound[IUD-cystic changes in the endometrium] in place; yearly follow-up with gynecology  # CHEK- 2  mutations: discussed re: colonoscopy starting 40 years; patient will speak to [name]  # DISPOSITION: # follow up in 6 months- MD; labs-cbc/cmp/ca-27-29- Dr.B  Addendum: on 7/20-spoke to the patient regarding rising levels of the tumor marker-compared to previous numbers.  Recommend CT scan chest abdomen pelvis ASAP/follow-up to review results.     All questions were answered. The patient knows to call the clinic with any problems, questions or concerns.       Cammie Sickle, MD 01/06/2021 7:24 AM

## 2021-01-04 NOTE — Telephone Encounter (Signed)
Pt aware of neg GYN u/s results for menorrhagia. EM=4 mm, IUD in correct location. Is on tamoxifen and menses are every few months, but long and heavy (6-7 days) when she has them. No BTB. Has paragard IUD. Will cont to follow cycles for now. F/u prn AUB.

## 2021-01-04 NOTE — Assessment & Plan Note (Addendum)
#  Stage IB multifocal LEFT breast cancer ER-positive; Her2 positive- on Tamoxifen.  S/p left mastectomy; right prophylactic mastectomy; .  Clinically stable.  No evidence of recurrence.  Await tumor marker today.  #Patient currently on tamoxifen.  Tolerating fairly well except for mild hot flashes. ? OS/?Zometa  # Hot flashes: G-1 [sec to summer]-stable.  Hold off any pharmacotherapy.  #  Premenopausal [July 6751]-TWYSOR ultrasound[IUD-cystic changes in the endometrium] in place; yearly follow-up with gynecology  # CHEK- 2 mutations: discussed re: colonoscopy starting 40 years; patient will speak to [name]  # DISPOSITION: # follow up in 6 months- MD; labs-cbc/cmp/ca-27-29- Dr.B  Addendum: on 7/20-spoke to the patient regarding rising levels of the tumor marker-compared to previous numbers.  Recommend CT scan chest abdomen pelvis ASAP/follow-up to review results.

## 2021-01-05 ENCOUNTER — Encounter: Payer: Self-pay | Admitting: Internal Medicine

## 2021-01-05 LAB — CANCER ANTIGEN 27.29: CA 27.29: 22.9 U/mL (ref 0.0–38.6)

## 2021-01-06 ENCOUNTER — Telehealth: Payer: Self-pay | Admitting: Internal Medicine

## 2021-01-06 ENCOUNTER — Encounter: Payer: Self-pay | Admitting: Urgent Care

## 2021-01-06 ENCOUNTER — Encounter: Payer: Self-pay | Admitting: Hematology and Oncology

## 2021-01-06 DIAGNOSIS — C50412 Malignant neoplasm of upper-outer quadrant of left female breast: Secondary | ICD-10-CM

## 2021-01-06 DIAGNOSIS — R978 Other abnormal tumor markers: Secondary | ICD-10-CM

## 2021-01-06 DIAGNOSIS — Z170 Estrogen receptor positive status [ER+]: Secondary | ICD-10-CM

## 2021-01-06 NOTE — Telephone Encounter (Signed)
Spoke to patient regarding results of the tumor marker.  C-schedule CT scan chest abdomen pelvis with contrast-follow-up 1 to 2 days later; in [place].  Thanks GB

## 2021-01-17 ENCOUNTER — Other Ambulatory Visit: Payer: Self-pay

## 2021-01-17 ENCOUNTER — Ambulatory Visit
Admission: RE | Admit: 2021-01-17 | Discharge: 2021-01-17 | Disposition: A | Discharge status: Home / Self Care | Payer: BC Managed Care – PPO | Source: Ambulatory Visit | Attending: Internal Medicine | Admitting: Internal Medicine

## 2021-01-17 DIAGNOSIS — C50412 Malignant neoplasm of upper-outer quadrant of left female breast: Secondary | ICD-10-CM | POA: Insufficient documentation

## 2021-01-17 DIAGNOSIS — Z170 Estrogen receptor positive status [ER+]: Secondary | ICD-10-CM | POA: Insufficient documentation

## 2021-01-17 DIAGNOSIS — R978 Other abnormal tumor markers: Secondary | ICD-10-CM | POA: Diagnosis present

## 2021-01-17 MED ORDER — IOHEXOL 350 MG/ML SOLN
100.0000 mL | Freq: Once | INTRAVENOUS | Status: AC | PRN
  Administered 2021-01-17: 100 mL via INTRAVENOUS

## 2021-01-18 ENCOUNTER — Inpatient Hospital Stay: Payer: BC Managed Care – PPO | Attending: Internal Medicine | Admitting: Internal Medicine

## 2021-01-18 ENCOUNTER — Encounter: Payer: Self-pay | Admitting: Internal Medicine

## 2021-01-18 VITALS — BP 117/90 | HR 80 | Temp 97.8°F | Resp 18 | Wt 169.3 lb

## 2021-01-18 DIAGNOSIS — Z170 Estrogen receptor positive status [ER+]: Secondary | ICD-10-CM | POA: Diagnosis not present

## 2021-01-18 DIAGNOSIS — Z1509 Genetic susceptibility to other malignant neoplasm: Secondary | ICD-10-CM | POA: Diagnosis not present

## 2021-01-18 DIAGNOSIS — Z79810 Long term (current) use of selective estrogen receptor modulators (SERMs): Secondary | ICD-10-CM | POA: Insufficient documentation

## 2021-01-18 DIAGNOSIS — C50412 Malignant neoplasm of upper-outer quadrant of left female breast: Secondary | ICD-10-CM | POA: Insufficient documentation

## 2021-01-18 DIAGNOSIS — C773 Secondary and unspecified malignant neoplasm of axilla and upper limb lymph nodes: Secondary | ICD-10-CM | POA: Insufficient documentation

## 2021-01-18 DIAGNOSIS — Z1501 Genetic susceptibility to malignant neoplasm of breast: Secondary | ICD-10-CM | POA: Diagnosis not present

## 2021-01-18 NOTE — Assessment & Plan Note (Addendum)
#  Stage IB multifocal LEFT breast cancer ER-positive; Her2 positive- on Tamoxifen.  S/p left mastectomy; right prophylactic mastectomy-clinically stable.  However tumor marker slightly elevated compared to 6 months ago [although within normal range]; July 2022 CT scan chest and pelvis negative for any metastatic disease; bilateral mastectomies and mild scarring noted in the left upper lobe/radiation-induced likely.  #Patient continues to be on tamoxifen.  Tolerating fairly well except for mild hot flashes.  Discussed the role of adjuvant Zometa every 6 months.  Patient is undecided; might consider next visit.  Discussed the potential side effects including but not limited to-infusion reaction; hypocalcemia; and Osteo-necrosis of jaw. Recommend ca+Vit D BID.   # Hot flashes: G-1 [sec to summer]-stable.  Hold off any pharmacotherapy.  #  Premenopausal [July 2836]-OQHUTM ultrasound[IUD-cystic changes in the endometrium] in place; yearly follow-up with gynecology  # CHEK- 2 mutations: discussed re: colonoscopy starting 40 years; patient will speak to [name]  # DISPOSITION: # cancel 6 month appt. # follow up in 3 months- MD; 3 days prior-labs-cbc/cmp/ca-27-29;possible Zometa infusion- Dr.B  # I reviewed the blood work- with the patient in detail; also reviewed the imaging independently [as summarized above]; and with the patient in detail.

## 2021-01-18 NOTE — Progress Notes (Signed)
[place] NOTE  Patient Care Team: Venetia Maxon, Rolanda Jay, PA-C as PCP - General (Family Medicine) Bary Castilla, Forest Gleason, MD (General Surgery) Gae Dry, MD as Referring Physician (Obstetrics and Gynecology) Lequita Asal, MD (Inactive) as Referring Physician (Hematology and Oncology) Rico Junker, RN as Oncology Nurse Navigator Noreene Filbert, MD as Referring Physician (Radiation Oncology) [place] Pink, MD as Consulting Physician (Family Medicine)  CHIEF COMPLAINTS/PURPOSE OF CONSULTATION: Breast cancer  #  Oncology History Overview Note  Denise [name] is a 45 y.o. 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  Pathology revealed no residual carcinoma in the breast.  One of three sentinel lymph nodes were positive.  One lymph node had 2 metastatic foci (2.1 mm and 1 mm).  Pathologic stage revealed ypT0 ypN1a.   Index breast mass and axillary node biopsy on 12/06/2017 revealed grade II invasive ductal carcinoma with calcifications at the 11 o'clock position.  There was metastatic carcinoma in 1 of 1 lymph nodes.  Tumor was ER + (100%), PR + (100%), and Ki67 5%.  Her2/neu was 3+ by IHC (heterogeneous) and FISH +.   Diagnostic left mammogram and ultrasound on 12/06/2017 revealed a 1.7 x 1.3 x 1.3 cm irregular hypoechoic mass with internal calcifications at the 11 o'clock position 2 cm from the nipple with internal calcifications. There was a similar appearing 0.9 x 0.8 x 0.7 cm mass at the 11 o'clock position 4 cm from the nipple (2.4 cm from the index lesion).  There were suspicious, segmental calcifications originating from the index lesion and extending 5 cm posteriorly.  There was a 0.6 cm morphologically abnormal left axillary lymph node.   Bilateral breast MRI on 12/10/2017 revealed a papilloma in the right breast  The recently  biopsied malignancy in the left breast (15 x 16 x 16 mm) was identified. The adjacent and more superiorly located 11 o'clock mass (7 x 12 mm) seen on recent ultrasound, 4 cm from the nipple on ultrasound, was also identified. There were 2 probable satellite lesions (7 mm, medial lesion; posterior and lateral lesion). The total span of malignancy was 3 x 3.1 x 4.5 cm.  There were calcifications extending up to 5 cm posterior to the biopsied malignancy which were highly suspicious on mammography but not appreciated.  There was a mass in the lower outer left breast measured 5 mm, representing a change.  The known metastatic node in the left axilla was identified. A node along the posterolateral margin of the pectoralis minor (cortex 5 mm) was nonspecific but at least somewhat suspicious.   Chest, abdomen, and pelvic CT on 12/19/2017 revealed a solitary enlarged biopsy-proven metastatic left axillary lymph node.  There were no additional findings suspicious for metastatic disease in the chest, abdomen or pelvis.  There was a nonspecific tiny sclerotic upper right sacral lesion, more likely a benign bone island. Bone scintigraphy correlation versus follow-up CT could be considered.   Bone scan on 01/11/2018 was negative for metastatic pattern. No areas of focal tracer uptake. Area of concern in sacrum on CT likely represents benign bone island.    [facility] genetic testing on 08/26/2013 was negative for BRCA1 and 2. 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  She has a family history significant for breast, ovarian, pancreatic, and prostate cancer.   She received 6 cycles of TCHP chemotherapy (12/21/2017 - 04/17/2018) with Margarette Canada  support. Cycle #3 was held due to elevated LFTs on 02/04/2018. She received Herceptin + Perjeta alone (04/30/2018 - 05/30/2018).  She is s/p cycle #1 Kadcyla (06/20/2018).   She began breast radiation on 06/25/2018   Echocardiograms:  12/20/2017 - EF of  60-65%, 03/27/2018 - EF of 60-65%, and 06/18/2018 - EF of 55-60%.   She has a history of elevated LFTs.  Hepatitis B and C serologies were negative on 02/04/2018.  RUQ ultrasound on 02/06/2018 revealed a small anterior gallbladder wall polyp. There was no evidence of cholelithiasis or cholecystis. CBD measured normal at 2 mm, with no evidence of choledocholithiasis. There was normal direction of blood flow towards the liver noted.   Carcinoma of upper-outer quadrant of left breast in female, estrogen receptor positive ([place])  09/17/2014 Initial Diagnosis   Malignant neoplasm of upper-outer quadrant of female breast ([place])   12/21/2017 - 04/18/2018 Chemotherapy   The patient had dexamethasone (DECADRON) 4 MG tablet, 8 mg, Oral, 2 times daily, 2 of 2 cycles palonosetron (ALOXI) injection 0.25 mg, 0.25 mg, Intravenous,  Once, 6 of 6 cycles Administration: 0.25 mg (12/21/2017), 0.25 mg (01/14/2018), 0.25 mg (03/28/2018), 0.25 mg (04/17/2018), 0.25 mg (02/11/2018), 0.25 mg (03/07/2018) pegfilgrastim (NEULASTA ONPRO KIT) injection 6 mg, 6 mg, Subcutaneous, Once, 1 of 1 cycle Administration: 6 mg (12/21/2017) pegfilgrastim-cbqv (UDENYCA) injection 6 mg, 6 mg, Subcutaneous, Once, 6 of 6 cycles Administration: 6 mg (01/15/2018), 6 mg (03/29/2018), 6 mg (04/18/2018), 6 mg (02/12/2018), 6 mg (03/08/2018) trastuzumab (HERCEPTIN) 600 mg in sodium chloride 0.9 % 250 mL chemo infusion, 672 mg, Intravenous,  Once, 6 of 6 cycles Administration: 600 mg (12/21/2017), 450 mg (01/14/2018), 500 mg (03/28/2018), 500 mg (04/17/2018), 500 mg (02/11/2018), 500 mg (03/07/2018) CARBOplatin (PARAPLATIN) 870 mg in sodium chloride 0.9 % 500 mL chemo infusion, 890 mg (100 % of original dose 885 mg), Intravenous,  Once, 6 of 6 cycles Dose modification:   (original dose 885 mg, Cycle 1),   (original dose 885 mg, Cycle 2),   (original dose 885 mg, Cycle 5),   (original dose 885 mg, Cycle 6),   (original dose 885 mg, Cycle 3),   (original dose 885 mg,  Cycle 4) Administration: 870 mg (12/21/2017), 870 mg (01/14/2018), 870 mg (03/28/2018), 870 mg (04/17/2018), 870 mg (02/11/2018), 870 mg (03/07/2018) DOCEtaxel (TAXOTERE) 150 mg in sodium chloride 0.9 % 250 mL chemo infusion, 75 mg/m2 = 150 mg, Intravenous,  Once, 6 of 6 cycles Dose modification: 55 mg/m2 (original dose 75 mg/m2, Cycle 5, Reason: Change in LFTs, Comment: per pharmacy) Administration: 150 mg (12/21/2017), 150 mg (01/14/2018), 110 mg (03/28/2018), 150 mg (04/17/2018), 150 mg (02/11/2018), 150 mg (03/07/2018) pertuzumab (PERJETA) 840 mg in sodium chloride 0.9 % 250 mL chemo infusion, 840 mg, Intravenous, Once, 6 of 6 cycles Administration: 840 mg (12/21/2017), 420 mg (01/14/2018), 420 mg (03/28/2018), 420 mg (04/17/2018), 420 mg (02/11/2018), 420 mg (03/07/2018) fosaprepitant (EMEND) 150 mg, dexamethasone (DECADRON) 12 mg in sodium chloride 0.9 % 145 mL IVPB, , Intravenous,  Once, 6 of 6 cycles Administration:  (12/21/2017),  (01/14/2018),  (03/28/2018),  (04/17/2018),  (02/11/2018),  (03/07/2018)   for chemotherapy treatment.     05/09/2018 - 05/30/2018 Chemotherapy   The patient had trastuzumab (HERCEPTIN) 500 mg in sodium chloride 0.9 % 250 mL chemo infusion, 504 mg, Intravenous,  Once, 2 of 11 cycles Administration: 500 mg (05/30/2018), 500 mg (05/09/2018) pertuzumab (PERJETA) 420 mg in sodium chloride 0.9 % 250 mL chemo infusion, 420 mg, Intravenous, Once, 2 of 11  cycles Administration: 420 mg (05/30/2018), 420 mg (05/09/2018)   for chemotherapy treatment.     01/17/2019 -  Chemotherapy   The patient had trastuzumab-anns (KANJINTI) 500 mg in sodium chloride 0.9 % 250 mL chemo infusion, 441 mg (100 % of original dose 6 mg/kg), Intravenous,  Once, 3 of 3 cycles Dose modification: 6 mg/kg (original dose 6 mg/kg, Cycle 1, Reason: Provider Judgment), 2 mg/kg (original dose 6 mg/kg, Cycle 3, Reason: Provider Judgment, Comment: 1 week short of 52 weeks of Her2/neu based treatment) Administration: 500  mg (01/24/2019), 150 mg (02/14/2019)   for chemotherapy treatment.       HISTORY OF PRESENTING ILLNESS:  Denise [name] 45 y.o.  female with a history of stage Ib ER/PR positive HER2/neu positive breast cancer currently on tamoxifen is here for follow-up/review results of the CT scan.  Patient had imaging done because of slightly elevated tumor markers.  Patient denies any new onset of bone pain or joint pains.  Chronic mild hot flashes.  Not any worse.  No shortness of breath or cough.  Review of Systems  Constitutional:  Negative for chills, diaphoresis, fever, malaise/fatigue and weight loss.  HENT:  Negative for nosebleeds and sore throat.   Eyes:  Negative for double vision.  Respiratory:  Negative for cough, hemoptysis, sputum production, shortness of breath and wheezing.   Cardiovascular:  Negative for chest pain, palpitations, orthopnea and leg swelling.  Gastrointestinal:  Negative for abdominal pain, blood in stool, constipation, diarrhea, heartburn, melena, nausea and vomiting.  Genitourinary:  Negative for dysuria, frequency and urgency.  Musculoskeletal:  Negative for back pain and joint pain.  Skin: Negative.  Negative for itching and rash.  Neurological:  Negative for dizziness, tingling, focal weakness, weakness and headaches.  Endo/Heme/Allergies:  Does not bruise/bleed easily.  Psychiatric/Behavioral:  Negative for depression. The patient is not nervous/anxious and does not have insomnia.     MEDICAL HISTORY:  Past Medical History:  Diagnosis Date   Abnormal breast biopsy    PASH, [name]   Breast cancer [facility]) 11/2017   left breast; ER/PR/Her2neu POS   Family history of breast cancer    Family history of ovarian cancer    Fibroadenoma of breast, left 2000, 2016   Genetic screening 08/26/2013   BRCA/BART negative/Myriad, CHEK2 POS 2017   Increased risk of breast cancer 2017   IBIS=47%   Left breast lump 06/18/2017   Fluid/drained [name]   Migraine     Monoallelic mutation of CHEK2 gene in female patient 2017   increased risk of breast and colon cancer   Personal history of chemotherapy    Personal history of radiation therapy     SURGICAL HISTORY: Past Surgical History:  Procedure Laterality Date   BREAST BIOPSY Bilateral 09/08/14   neg   BREAST BIOPSY Left 03/16/2015   Procedure: BREAST BIOPSY WITH NEEDLE LOCALIZATION;  Surgeon: [name], MD;  Location: [facility];  Service: General;  Laterality: Left;   BREAST BIOPSY Left 11/2017   positive   BREAST CYST ASPIRATION Left 06/18/2017   cyst aspiration   BREAST EXCISIONAL BIOPSY Left 09/2014   BREAST EXCISIONAL BIOPSY Right    age 69's   BREAST RECONSTRUCTION WITH PLACEMENT OF TISSUE EXPANDER AND FLEX HD (ACELLULAR HYDRATED DERMIS) Left 05/20/2018   Procedure: BREAST RECONSTRUCTION WITH PLACEMENT OF TISSUE EXPANDER AND FLEX HD (ACELLULAR HYDRATED DERMIS);  Surgeon: [name], DO;  Location: [facility];  Service: Plastics;  Laterality: Left;   BREAST RECONSTRUCTION  WITH PLACEMENT OF TISSUE EXPANDER AND FLEX HD (ACELLULAR HYDRATED DERMIS) Right 12/09/2018   Procedure: BREAST RECONSTRUCTION WITH PLACEMENT OF TISSUE EXPANDER AND FLEX HD (ACELLULAR HYDRATED DERMIS);  Surgeon: [name], DO;  Location: [facility];  Service: Plastics;  Laterality: Right;   BREAST SURGERY Right March 2000   benign fibroadenoma   BREAST SURGERY Left 08/08/13   excision   BREAST SURGERY Left 09/23/14   excision   ESOPHAGOGASTRODUODENOSCOPY (EGD) WITH PROPOFOL N/A 01/20/2019   Procedure: ESOPHAGOGASTRODUODENOSCOPY (EGD) WITH PROPOFOL;  Surgeon: [name], MD;  Location: [facility];  Service: Gastroenterology;  Laterality: N/A;   MASTECTOMY Left 05/20/2018   MASTECTOMY W/ SENTINEL NODE BIOPSY Left 05/20/2018   Procedure: MASTECTOMY WITH SENTINEL LYMPH NODE BIOPSY;  Surgeon: [name], MD;  Location: [facility];  Service: General;  Laterality: Left;   PORT-A-CATH REMOVAL  Right 03/31/2019   Procedure: REMOVAL PORT-A-CATH;  Surgeon: [name], DO;  Location: [facility];  Service: Plastics;  Laterality: Right;   PORTACATH PLACEMENT Right 12/17/2017   Procedure: INSERTION PORT-A-CATH;  Surgeon: [name], MD;  Location: [facility];  Service: General;  Laterality: Right;   REMOVAL OF BILATERAL TISSUE EXPANDERS WITH PLACEMENT OF BILATERAL BREAST IMPLANTS Bilateral 03/31/2019   Procedure: REMOVAL OF BILATERAL TISSUE EXPANDERS WITH PLACEMENT OF BILATERAL BREAST IMPLANTS;  Surgeon: [name], DO;  Location: [facility];  Service: Plastics;  Laterality: Bilateral;  2 hours   SIMPLE MASTECTOMY WITH AXILLARY SENTINEL NODE BIOPSY Right 12/09/2018   Procedure: SIMPLE MASTECTOMY RIGHT;  Surgeon: [name], MD;  Location: [facility];  Service: General;  Laterality: Right;    SOCIAL HISTORY: Social History   Socioeconomic History   Marital status: Married    Spouse name: Not on file   Number of children: Not on file   Years of education: Not on file   Highest education level: Not on file  Occupational History   Not on file  Tobacco Use   Smoking status: Never   Smokeless tobacco: Never  Vaping Use   Vaping Use: Never used  Substance and Sexual Activity   Alcohol use: Yes    Comment: occasionally   Drug use: No   Sexual activity: Yes    Birth control/protection: I.U.D.    Comment: Paragard  Other Topics Concern   Not on file  Social History Narrative   Not on file   Social Determinants of Health   Financial Resource Strain: Not on file  Food Insecurity: Not on file  Transportation Needs: Not on file  Physical Activity: Not on file  Stress: Not on file  Social Connections: Not on file  Intimate Partner Violence: Not on file    FAMILY HISTORY: Family History  Problem Relation Age of Onset   Prostate cancer Father 64       requiring tx/hormones   Breast cancer Paternal Grandmother 62   Breast cancer Maternal Aunt 60    Ovarian cancer Maternal Aunt 74   Breast cancer Paternal Aunt 76   Pancreatic cancer Maternal Aunt 75    ALLERGIES:  is allergic to steri-strip compound benzoin [benzoin compound] and tape.  MEDICATIONS:  Current Outpatient Medications  Medication Sig Dispense Refill   acetaminophen (TYLENOL) 500 MG tablet Take 1,000 mg by mouth every 6 (six) hours as needed for moderate pain or headache.      cholecalciferol (VITAMIN D3) 25 MCG (1000 UNIT) tablet Take 1,000 Units by mouth daily.     tamoxifen (NOLVADEX) 20  MG tablet Take 1 tablet (20 mg total) by mouth daily. 90 tablet 0   vitamin C (ASCORBIC ACID) 500 MG tablet Take 500 mg by mouth daily.     Current Facility-Administered Medications  Medication Dose Route Frequency Provider Last Rate Last Admin   paragard intrauterine copper IUD   Intrauterine Once Copland, Alicia B, PA-C       Facility-Administered Medications Ordered in Other Visits  Medication Dose Route Frequency Provider Last Rate Last Admin   sodium chloride flush (NS) 0.9 % injection 10 mL  10 mL Intravenous PRN Lequita Asal, MD   10 mL at 04/17/18 0844      .  PHYSICAL EXAMINATION: ECOG PERFORMANCE STATUS: 0 - Asymptomatic  Vitals:   01/18/21 1315  BP: 117/90  Pulse: 80  Resp: 18  Temp: 97.8 F (36.6 C)  SpO2: 99%   Filed Weights   01/18/21 1315  Weight: 169 lb 5 oz (76.8 kg)    Physical Exam Vitals and nursing note reviewed.  Constitutional:      Comments: Alone; ambulating independently  HENT:     Head: Normocephalic and atraumatic.     Mouth/Throat:     Mouth: Mucous membranes are moist.     Pharynx: Oropharynx is clear. No oropharyngeal exudate.  Eyes:     Extraocular Movements: Extraocular movements intact.     Pupils: Pupils are equal, round, and reactive to light.  Cardiovascular:     Rate and Rhythm: Normal rate and regular rhythm.  Pulmonary:     Effort: No respiratory distress.     Breath sounds: No wheezing.  Abdominal:      General: Bowel sounds are normal. There is no distension.     Palpations: Abdomen is soft. There is no mass.     Tenderness: no abdominal tenderness There is no guarding or rebound.  Musculoskeletal:        General: No tenderness. Normal range of motion.     Cervical back: Normal range of motion and neck supple.  Skin:    General: Skin is warm.  Neurological:     General: No focal deficit present.     Mental Status: She is alert and oriented to person, place, and time.  Psychiatric:        Mood and Affect: Affect normal.        Behavior: Behavior normal.        Judgment: Judgment normal.     LABORATORY DATA:  I have reviewed the data as listed Lab Results  Component Value Date   WBC 5.2 01/04/2021   HGB 12.4 01/04/2021   HCT 36.9 01/04/2021   MCV 85.6 01/04/2021   PLT 225 01/04/2021   Recent Labs    04/08/20 1351 08/05/20 1329 01/04/21 1005  NA 138 137 138  K 3.8 3.4* 4.1  CL 101 102 103  CO2 _0 GLUCOSE 76 95 103*  BUN _1 CREATININE 0.81 0.67 0.71  CALCIUM 8.9 9.1 9.5  GFRNONAA >60 >60 >60  PROT 7.4 7.6 7.7  ALBUMIN 4.2 4.3 4.3  AST _2 ALT _3 ALKPHOS 40 43 43  BILITOT 0.9 0.8 1.0    RADIOGRAPHIC STUDIES: I have personally reviewed the radiological images as listed and agreed with the findings in the report. CT CHEST ABDOMEN PELVIS W CONTRAST  Result Date: 01/17/2021 CLINICAL DATA:  Breast cancer surveillance, rising tumor markers. EXAM: CT CHEST, ABDOMEN, AND PELVIS WITH CONTRAST TECHNIQUE:  Multidetector CT imaging of the chest, abdomen and pelvis was performed following the standard protocol during bolus administration of intravenous contrast. CONTRAST:  170m OMNIPAQUE IOHEXOL 350 MG/ML SOLN COMPARISON:  CT December 19, 2017 FINDINGS: CT CHEST FINDINGS Cardiovascular: Scattered aortic atherosclerosis without aneurysmal dilation. No central pulmonary embolus. Normal size heart. No significant pericardial effusion/thickening.  Mediastinum/Nodes: No discrete thyroid nodule. No pathologically enlarged mediastinal, hilar or axillary lymph nodes. The trachea and esophagus are grossly unremarkable. Lungs/Pleura: Post radiation scarring in the left upper lobe. No suspicious pulmonary nodules or masses. No focal airspace consolidation. No pleural effusion. No pneumothorax. Musculoskeletal: Postsurgical change of bilateral mastectomies and reconstruction. No aggressive lytic or blastic lesion of bone. CT ABDOMEN PELVIS FINDINGS Hepatobiliary: No suspicious hepatic lesion. Gallbladder is unremarkable. No biliary ductal dilation. Pancreas: Within normal limits. Spleen: Within normal limits. Adrenals/Urinary Tract: Adrenal glands are unremarkable. Kidneys are normal, without renal calculi, solid enhancing lesion, or hydronephrosis. Bladder is unremarkable. Stomach/Bowel: Radiopaque enteric contrast traverses the hepatic flexure. Stomach is unremarkable. No pathologic dilation of small bowel. The appendix and terminal ileum are normal. Small volume of formed stool in the ascending and transverse colon. Descending colon is predominantly decompressed limiting evaluation. No suspicious colonic wall thickening or mass like lesions visualized. Vascular/Lymphatic: No significant vascular findings are present. No enlarged abdominal or pelvic lymph nodes. Reproductive: Intrauterine device which appears appropriate in position. Nabothian cysts. No suspicious adnexal masses. Other: No abdominopelvic ascites. Musculoskeletal: No aggressive lytic or blastic lesion of bone. Subcentimeter sclerotic focus in the upper right sacrum on image 98/2 is unchanged and likely represents a benign bone island. IMPRESSION: 1. No evidence of metastatic disease within the chest, abdomen, or pelvis. 2. Bilateral mastectomies and breast reconstruction with post radiation scarring in the left upper lobe. 3.  Aortic Atherosclerosis (ICD10-I70.0). Electronically Signed   By: JDahlia BailiffMD   On: 01/17/2021 11:32    ASSESSMENT & PLAN:   Carcinoma of upper-outer quadrant of left breast in [place], [place] Stage IB multifocal LEFT breast cancer ER-positive; Her2 positive- on Tamoxifen.  S/p left mastectomy; right prophylactic mastectomy-clinically stable.  However tumor marker slightly elevated compared to 6 months ago [although within normal range]; July 2022 CT scan chest and pelvis negative for any metastatic disease; bilateral mastectomies and mild scarring noted in the left upper lobe/radiation-induced likely.  #Patient continues to be on tamoxifen.  Tolerating fairly well except for mild hot flashes.  Discussed the role of adjuvant Zometa every 6 months.  Patient is undecided; might consider next visit.  Discussed the potential side effects including but not limited to-infusion reaction; hypocalcemia; and Osteo-necrosis of jaw. Recommend ca+Vit D BID.    # Hot flashes: G-1 [sec to summer]-stable.  Hold off any pharmacotherapy.  #  Premenopausal [July [id number]]-HYWVPXultrasound[IUD-cystic changes in the endometrium] in place; yearly follow-up with gynecology  # CHEK- 2 mutations: discussed re: colonoscopy starting 40 years; patient will speak to Dr. BBary Castilla # DISPOSITION: # cancel 6 month appt. # follow up in 3 months- MD; 3 days prior-labs-cbc/cmp/ca-27-29;possible Zometa infusion- Dr.B    All questions were answered. The patient knows to call the clinic with any problems, questions or concerns.       GCammie Sickle MD 02/06/2021 9:32 PM

## 2021-02-06 ENCOUNTER — Encounter: Payer: Self-pay | Admitting: Hematology and Oncology

## 2021-02-06 ENCOUNTER — Encounter: Payer: Self-pay | Admitting: Internal Medicine

## 2021-02-28 ENCOUNTER — Other Ambulatory Visit: Payer: Self-pay | Admitting: Internal Medicine

## 2021-02-28 ENCOUNTER — Encounter: Payer: Self-pay | Admitting: Internal Medicine

## 2021-02-28 DIAGNOSIS — C50419 Malignant neoplasm of upper-outer quadrant of unspecified female breast: Secondary | ICD-10-CM

## 2021-02-28 MED ORDER — TAMOXIFEN CITRATE 20 MG PO TABS: 20.0000 mg | ORAL_TABLET | Freq: Every day | ORAL | 0 refills | Status: DC

## 2021-04-13 NOTE — Telephone Encounter (Signed)
Paragard rcvd/charged 11/10/19

## 2021-04-22 ENCOUNTER — Inpatient Hospital Stay: Payer: BC Managed Care – PPO | Attending: Internal Medicine

## 2021-04-22 ENCOUNTER — Other Ambulatory Visit: Payer: Self-pay

## 2021-04-22 DIAGNOSIS — Z923 Personal history of irradiation: Secondary | ICD-10-CM | POA: Diagnosis not present

## 2021-04-22 DIAGNOSIS — Z79810 Long term (current) use of selective estrogen receptor modulators (SERMs): Secondary | ICD-10-CM | POA: Insufficient documentation

## 2021-04-22 DIAGNOSIS — C50412 Malignant neoplasm of upper-outer quadrant of left female breast: Secondary | ICD-10-CM | POA: Insufficient documentation

## 2021-04-22 DIAGNOSIS — Z79899 Other long term (current) drug therapy: Secondary | ICD-10-CM | POA: Diagnosis not present

## 2021-04-22 DIAGNOSIS — Z8041 Family history of malignant neoplasm of ovary: Secondary | ICD-10-CM | POA: Diagnosis not present

## 2021-04-22 DIAGNOSIS — Z9013 Acquired absence of bilateral breasts and nipples: Secondary | ICD-10-CM | POA: Insufficient documentation

## 2021-04-22 DIAGNOSIS — Z800 Family history of malignant neoplasm of digestive organs: Secondary | ICD-10-CM | POA: Insufficient documentation

## 2021-04-22 DIAGNOSIS — Z170 Estrogen receptor positive status [ER+]: Secondary | ICD-10-CM | POA: Insufficient documentation

## 2021-04-22 DIAGNOSIS — Z9221 Personal history of antineoplastic chemotherapy: Secondary | ICD-10-CM | POA: Insufficient documentation

## 2021-04-22 DIAGNOSIS — Z803 Family history of malignant neoplasm of breast: Secondary | ICD-10-CM | POA: Insufficient documentation

## 2021-04-22 LAB — COMPREHENSIVE METABOLIC PANEL
ALT: 20 U/L (ref 0–44)
AST: 22 U/L (ref 15–41)
Albumin: 4.4 g/dL (ref 3.5–5.0)
Alkaline Phosphatase: 43 U/L (ref 38–126)
Anion gap: 10 (ref 5–15)
BUN: 10 mg/dL (ref 6–20)
CO2: 26 mmol/L (ref 22–32)
Calcium: 9.2 mg/dL (ref 8.9–10.3)
Chloride: 103 mmol/L (ref 98–111)
Creatinine, Ser: 0.69 mg/dL (ref 0.44–1.00)
GFR, Estimated: >60 mL/min (ref >60)
Glucose, Bld: 93 mg/dL (ref 70–99)
Potassium: 3.7 mmol/L (ref 3.5–5.1)
Sodium: 139 mmol/L (ref 135–145)
Total Bilirubin: 1.1 mg/dL (ref 0.3–1.2)
Total Protein: 7.4 g/dL (ref 6.5–8.1)

## 2021-04-22 LAB — CBC WITH DIFFERENTIAL/PLATELET
Abs Immature Granulocytes: 0.01 10*3/uL (ref 0.00–0.07)
Basophils Absolute: 0 10*3/uL (ref 0.0–0.1)
Basophils Relative: 1 %
Eosinophils Absolute: 0.1 10*3/uL (ref 0.0–0.5)
Eosinophils Relative: 2 %
HCT: 36.3 % (ref 36.0–46.0)
Hemoglobin: 12.4 g/dL (ref 12.0–15.0)
Immature Granulocytes: 0 %
Lymphocytes Relative: 28 %
Lymphs Abs: 1.5 10*3/uL (ref 0.7–4.0)
MCH: 29.5 pg (ref 26.0–34.0)
MCHC: 34.2 g/dL (ref 30.0–36.0)
MCV: 86.4 fL (ref 80.0–100.0)
Monocytes Absolute: 0.3 10*3/uL (ref 0.1–1.0)
Monocytes Relative: 6 %
Neutro Abs: 3.5 10*3/uL (ref 1.7–7.7)
Neutrophils Relative %: 63 %
Platelets: 222 10*3/uL (ref 150–400)
RBC: 4.2 MIL/uL (ref 3.87–5.11)
RDW: 13.3 % (ref 11.5–15.5)
WBC: 5.4 10*3/uL (ref 4.0–10.5)
nRBC: 0 % (ref 0.0–0.2)

## 2021-04-23 LAB — CANCER ANTIGEN 27.29: CA 27.29: 19.7 U/mL (ref 0.0–38.6)

## 2021-04-26 ENCOUNTER — Inpatient Hospital Stay (HOSPITAL_BASED_OUTPATIENT_CLINIC_OR_DEPARTMENT_OTHER): Payer: BC Managed Care – PPO | Admitting: Internal Medicine

## 2021-04-26 ENCOUNTER — Other Ambulatory Visit: Payer: Self-pay

## 2021-04-26 VITALS — BP 121/86 | HR 74 | Temp 98.7°F | Resp 20 | Ht 67.0 in | Wt 171.5 lb

## 2021-04-26 DIAGNOSIS — C50412 Malignant neoplasm of upper-outer quadrant of left female breast: Secondary | ICD-10-CM | POA: Diagnosis not present

## 2021-04-26 DIAGNOSIS — Z170 Estrogen receptor positive status [ER+]: Secondary | ICD-10-CM | POA: Diagnosis not present

## 2021-04-26 NOTE — Progress Notes (Signed)
[place] NOTE  Patient Care Team: Venetia Maxon, Rolanda Jay, PA-C as PCP - General (Family Medicine) Bary Castilla, Forest Gleason, MD (General Surgery) Gae Dry, MD as Referring Physician (Obstetrics and Gynecology) Lequita Asal, MD (Inactive) as Referring Physician (Hematology and Oncology) Rico Junker, RN as Oncology Nurse Navigator Noreene Filbert, MD as Referring Physician (Radiation Oncology) [place] Pink, MD as Consulting Physician (Family Medicine)  CHIEF COMPLAINTS/PURPOSE OF CONSULTATION: Breast cancer  #  Oncology History Overview Note  Denise [name] is a 45 y.o. 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  Pathology revealed no residual carcinoma in the breast.  One of three sentinel lymph nodes were positive.  One lymph node had 2 metastatic foci (2.1 mm and 1 mm).  Pathologic stage revealed ypT0 ypN1a.   Index breast mass and axillary node biopsy on 12/06/2017 revealed grade II invasive ductal carcinoma with calcifications at the 11 o'clock position.  There was metastatic carcinoma in 1 of 1 lymph nodes.  Tumor was ER + (100%), PR + (100%), and Ki67 5%.  Her2/neu was 3+ by IHC (heterogeneous) and FISH +.   Diagnostic left mammogram and ultrasound on 12/06/2017 revealed a 1.7 x 1.3 x 1.3 cm irregular hypoechoic mass with internal calcifications at the 11 o'clock position 2 cm from the nipple with internal calcifications. There was a similar appearing 0.9 x 0.8 x 0.7 cm mass at the 11 o'clock position 4 cm from the nipple (2.4 cm from the index lesion).  There were suspicious, segmental calcifications originating from the index lesion and extending 5 cm posteriorly.  There was a 0.6 cm morphologically abnormal left axillary lymph node.   Bilateral breast MRI on 12/10/2017 revealed a papilloma in the right breast  The recently  biopsied malignancy in the left breast (15 x 16 x 16 mm) was identified. The adjacent and more superiorly located 11 o'clock mass (7 x 12 mm) seen on recent ultrasound, 4 cm from the nipple on ultrasound, was also identified. There were 2 probable satellite lesions (7 mm, medial lesion; posterior and lateral lesion). The total span of malignancy was 3 x 3.1 x 4.5 cm.  There were calcifications extending up to 5 cm posterior to the biopsied malignancy which were highly suspicious on mammography but not appreciated.  There was a mass in the lower outer left breast measured 5 mm, representing a change.  The known metastatic node in the left axilla was identified. A node along the posterolateral margin of the pectoralis minor (cortex 5 mm) was nonspecific but at least somewhat suspicious.   Chest, abdomen, and pelvic CT on 12/19/2017 revealed a solitary enlarged biopsy-proven metastatic left axillary lymph node.  There were no additional findings suspicious for metastatic disease in the chest, abdomen or pelvis.  There was a nonspecific tiny sclerotic upper right sacral lesion, more likely a benign bone island. Bone scintigraphy correlation versus follow-up CT could be considered.   Bone scan on 01/11/2018 was negative for metastatic pattern. No areas of focal tracer uptake. Area of concern in sacrum on CT likely represents benign bone island.    [facility] genetic testing on 08/26/2013 was negative for BRCA1 and 2. 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  She has a family history significant for breast, ovarian, pancreatic, and prostate cancer.   She received 6 cycles of TCHP chemotherapy (12/21/2017 - 04/17/2018) with Margarette Canada  support. Cycle #3 was held due to elevated LFTs on 02/04/2018. She received Herceptin + Perjeta alone (04/30/2018 - 05/30/2018).  She is s/p cycle #1 Kadcyla (06/20/2018).   She began breast radiation on 06/25/2018   Echocardiograms:  12/20/2017 - EF of  60-65%, 03/27/2018 - EF of 60-65%, and 06/18/2018 - EF of 55-60%.   She has a history of elevated LFTs.  Hepatitis B and C serologies were negative on 02/04/2018.  RUQ ultrasound on 02/06/2018 revealed a small anterior gallbladder wall polyp. There was no evidence of cholelithiasis or cholecystis. CBD measured normal at 2 mm, with no evidence of choledocholithiasis. There was normal direction of blood flow towards the liver noted.   Carcinoma of upper-outer quadrant of left breast in female, estrogen receptor positive ([place])  09/17/2014 Initial Diagnosis   Malignant neoplasm of upper-outer quadrant of female breast ([place])   12/21/2017 - 04/18/2018 Chemotherapy   The patient had dexamethasone (DECADRON) 4 MG tablet, 8 mg, Oral, 2 times daily, 2 of 2 cycles palonosetron (ALOXI) injection 0.25 mg, 0.25 mg, Intravenous,  Once, 6 of 6 cycles Administration: 0.25 mg (12/21/2017), 0.25 mg (01/14/2018), 0.25 mg (03/28/2018), 0.25 mg (04/17/2018), 0.25 mg (02/11/2018), 0.25 mg (03/07/2018) pegfilgrastim (NEULASTA ONPRO KIT) injection 6 mg, 6 mg, Subcutaneous, Once, 1 of 1 cycle Administration: 6 mg (12/21/2017) pegfilgrastim-cbqv (UDENYCA) injection 6 mg, 6 mg, Subcutaneous, Once, 6 of 6 cycles Administration: 6 mg (01/15/2018), 6 mg (03/29/2018), 6 mg (04/18/2018), 6 mg (02/12/2018), 6 mg (03/08/2018) trastuzumab (HERCEPTIN) 600 mg in sodium chloride 0.9 % 250 mL chemo infusion, 672 mg, Intravenous,  Once, 6 of 6 cycles Administration: 600 mg (12/21/2017), 450 mg (01/14/2018), 500 mg (03/28/2018), 500 mg (04/17/2018), 500 mg (02/11/2018), 500 mg (03/07/2018) CARBOplatin (PARAPLATIN) 870 mg in sodium chloride 0.9 % 500 mL chemo infusion, 890 mg (100 % of original dose 885 mg), Intravenous,  Once, 6 of 6 cycles Dose modification:   (original dose 885 mg, Cycle 1),   (original dose 885 mg, Cycle 2),   (original dose 885 mg, Cycle 5),   (original dose 885 mg, Cycle 6),   (original dose 885 mg, Cycle 3),   (original dose 885 mg,  Cycle 4) Administration: 870 mg (12/21/2017), 870 mg (01/14/2018), 870 mg (03/28/2018), 870 mg (04/17/2018), 870 mg (02/11/2018), 870 mg (03/07/2018) DOCEtaxel (TAXOTERE) 150 mg in sodium chloride 0.9 % 250 mL chemo infusion, 75 mg/m2 = 150 mg, Intravenous,  Once, 6 of 6 cycles Dose modification: 55 mg/m2 (original dose 75 mg/m2, Cycle 5, Reason: Change in LFTs, Comment: per pharmacy) Administration: 150 mg (12/21/2017), 150 mg (01/14/2018), 110 mg (03/28/2018), 150 mg (04/17/2018), 150 mg (02/11/2018), 150 mg (03/07/2018) pertuzumab (PERJETA) 840 mg in sodium chloride 0.9 % 250 mL chemo infusion, 840 mg, Intravenous, Once, 6 of 6 cycles Administration: 840 mg (12/21/2017), 420 mg (01/14/2018), 420 mg (03/28/2018), 420 mg (04/17/2018), 420 mg (02/11/2018), 420 mg (03/07/2018) fosaprepitant (EMEND) 150 mg, dexamethasone (DECADRON) 12 mg in sodium chloride 0.9 % 145 mL IVPB, , Intravenous,  Once, 6 of 6 cycles Administration:  (12/21/2017),  (01/14/2018),  (03/28/2018),  (04/17/2018),  (02/11/2018),  (03/07/2018)   for chemotherapy treatment.     05/09/2018 - 05/30/2018 Chemotherapy   The patient had trastuzumab (HERCEPTIN) 500 mg in sodium chloride 0.9 % 250 mL chemo infusion, 504 mg, Intravenous,  Once, 2 of 11 cycles Administration: 500 mg (05/30/2018), 500 mg (05/09/2018) pertuzumab (PERJETA) 420 mg in sodium chloride 0.9 % 250 mL chemo infusion, 420 mg, Intravenous, Once, 2 of 11  cycles Administration: 420 mg (05/30/2018), 420 mg (05/09/2018)   for chemotherapy treatment.     01/17/2019 -  Chemotherapy   The patient had trastuzumab-anns (KANJINTI) 500 mg in sodium chloride 0.9 % 250 mL chemo infusion, 441 mg (100 % of original dose 6 mg/kg), Intravenous,  Once, 3 of 3 cycles Dose modification: 6 mg/kg (original dose 6 mg/kg, Cycle 1, Reason: Provider Judgment), 2 mg/kg (original dose 6 mg/kg, Cycle 3, Reason: Provider Judgment, Comment: 1 week short of 52 weeks of Her2/neu based treatment) Administration: 500  mg (01/24/2019), 150 mg (02/14/2019)   for chemotherapy treatment.       HISTORY OF PRESENTING ILLNESS:  Denise [name] 45 y.o.  female with a history of stage Ib ER/PR positive HER2/neu positive breast cancer currently on tamoxifen is here for follow-up/review results of tumor marker.  Patient continues to have mild hot flashes.  Most in the summers.   Patient denies any new onset of bone pain or joint pains.  Chronic mild hot flashes.  Not any worse.  No shortness of breath or cough.  Review of Systems  Constitutional:  Negative for chills, diaphoresis, fever, malaise/fatigue and weight loss.  HENT:  Negative for nosebleeds and sore throat.   Eyes:  Negative for double vision.  Respiratory:  Negative for cough, hemoptysis, sputum production, shortness of breath and wheezing.   Cardiovascular:  Negative for chest pain, palpitations, orthopnea and leg swelling.  Gastrointestinal:  Negative for abdominal pain, blood in stool, constipation, diarrhea, heartburn, melena, nausea and vomiting.  Genitourinary:  Negative for dysuria, frequency and urgency.  Musculoskeletal:  Negative for back pain and joint pain.  Skin: Negative.  Negative for itching and rash.  Neurological:  Negative for dizziness, tingling, focal weakness, weakness and headaches.  Endo/Heme/Allergies:  Does not bruise/bleed easily.  Psychiatric/Behavioral:  Negative for depression. The patient is not nervous/anxious and does not have insomnia.     MEDICAL HISTORY:  Past Medical History:  Diagnosis Date   Abnormal breast biopsy    [facility], [name]   Breast cancer [facility] 11/2017   left breast; ER/PR/Her2neu POS   Family history of breast cancer    Family history of ovarian cancer    Fibroadenoma of breast, left 2000, 2016   Genetic screening 08/26/2013   BRCA/BART negative/[facility]riad, CHEK2 POS 2017   Increased risk of breast cancer 2017   IBIS=47%   Left breast lump 06/18/2017   Fluid/drained [name]   Migraine     Monoallelic mutation of CHEK2 gene in female patient 2017   increased risk of breast and colon cancer   Personal history of chemotherapy    Personal history of radiation therapy     SURGICAL HISTORY: Past Surgical History:  Procedure Laterality Date   BREAST BIOPSY Bilateral 09/08/14   neg   BREAST BIOPSY Left 03/16/2015   Procedure: BREAST BIOPSY WITH NEEDLE LOCALIZATION;  Surgeon: [name], MD;  Location: [facility];  Service: General;  Laterality: Left;   BREAST BIOPSY Left 11/2017   positive   BREAST CYST ASPIRATION Left 06/18/2017   cyst aspiration   BREAST EXCISIONAL BIOPSY Left 09/2014   BREAST EXCISIONAL BIOPSY Right    age 53's   BREAST RECONSTRUCTION WITH PLACEMENT OF TISSUE EXPANDER AND FLEX HD (ACELLULAR HYDRATED DERMIS) Left 05/20/2018   Procedure: BREAST RECONSTRUCTION WITH PLACEMENT OF TISSUE EXPANDER AND FLEX HD (ACELLULAR HYDRATED DERMIS);  Surgeon: [name], DO;  Location: [facility];  Service: Plastics;  Laterality: Left;  BREAST RECONSTRUCTION WITH PLACEMENT OF TISSUE EXPANDER AND FLEX HD (ACELLULAR HYDRATED DERMIS) Right 12/09/2018   Procedure: BREAST RECONSTRUCTION WITH PLACEMENT OF TISSUE EXPANDER AND FLEX HD (ACELLULAR HYDRATED DERMIS);  Surgeon: [name], DO;  Location: [facility];  Service: Plastics;  Laterality: Right;   BREAST SURGERY Right March 2000   benign fibroadenoma   BREAST SURGERY Left 08/08/13   excision   BREAST SURGERY Left 09/23/14   excision   ESOPHAGOGASTRODUODENOSCOPY (EGD) WITH PROPOFOL N/A 01/20/2019   Procedure: ESOPHAGOGASTRODUODENOSCOPY (EGD) WITH PROPOFOL;  Surgeon: [name], MD;  Location: [facility];  Service: Gastroenterology;  Laterality: N/A;   MASTECTOMY Left 05/20/2018   MASTECTOMY W/ SENTINEL NODE BIOPSY Left 05/20/2018   Procedure: MASTECTOMY WITH SENTINEL LYMPH NODE BIOPSY;  Surgeon: [name], MD;  Location: [facility];  Service: General;  Laterality: Left;   PORT-A-CATH REMOVAL  Right 03/31/2019   Procedure: REMOVAL PORT-A-CATH;  Surgeon: [name], DO;  Location: [facility];  Service: Plastics;  Laterality: Right;   PORTACATH PLACEMENT Right 12/17/2017   Procedure: INSERTION PORT-A-CATH;  Surgeon: [name], MD;  Location: [facility];  Service: General;  Laterality: Right;   REMOVAL OF BILATERAL TISSUE EXPANDERS WITH PLACEMENT OF BILATERAL BREAST IMPLANTS Bilateral 03/31/2019   Procedure: REMOVAL OF BILATERAL TISSUE EXPANDERS WITH PLACEMENT OF BILATERAL BREAST IMPLANTS;  Surgeon: [name], DO;  Location: [facility];  Service: Plastics;  Laterality: Bilateral;  2 hours   SIMPLE MASTECTOMY WITH AXILLARY SENTINEL NODE BIOPSY Right 12/09/2018   Procedure: SIMPLE MASTECTOMY RIGHT;  Surgeon: [name], MD;  Location: [facility];  Service: General;  Laterality: Right;    SOCIAL HISTORY: Social History   Socioeconomic History   Marital status: Married    Spouse name: Not on file   Number of children: Not on file   Years of education: Not on file   Highest education level: Not on file  Occupational History   Not on file  Tobacco Use   Smoking status: Never   Smokeless tobacco: Never  Vaping Use   Vaping Use: Never used  Substance and Sexual Activity   Alcohol use: Yes    Comment: occasionally   Drug use: No   Sexual activity: Yes    Birth control/protection: I.U.D.    Comment: Paragard  Other Topics Concern   Not on file  Social History Narrative   Not on file   Social Determinants of Health   Financial Resource Strain: Not on file  Food Insecurity: Not on file  Transportation Needs: Not on file  Physical Activity: Not on file  Stress: Not on file  Social Connections: Not on file  Intimate Partner Violence: Not on file    FAMILY HISTORY: Family History  Problem Relation Age of Onset   Prostate cancer Father 22       requiring tx/hormones   Breast cancer Paternal Grandmother 26   Breast cancer Maternal Aunt 60    Ovarian cancer Maternal Aunt 19   Breast cancer Paternal Aunt 10   Pancreatic cancer Maternal Aunt 75    ALLERGIES:  is allergic to steri-strip compound benzoin [benzoin compound] and tape.  MEDICATIONS:  Current Outpatient Medications  Medication Sig Dispense Refill   acetaminophen (TYLENOL) 500 MG tablet Take 1,000 mg by mouth every 6 (six) hours as needed for moderate pain or headache.      cholecalciferol (VITAMIN D3) 25 MCG (1000 UNIT) tablet Take 1,000 Units by mouth daily.     tamoxifen (  NOLVADEX) 20 MG tablet Take 1 tablet (20 mg total) by mouth daily. 90 tablet 0   vitamin C (ASCORBIC ACID) 500 MG tablet Take 500 mg by mouth daily.     Current Facility-Administered Medications  Medication Dose Route Frequency Provider Last Rate Last Admin   paragard intrauterine copper IUD   Intrauterine Once Copland, Alicia B, PA-C       Facility-Administered Medications Ordered in Other Visits  Medication Dose Route Frequency Provider Last Rate Last Admin   sodium chloride flush (NS) 0.9 % injection 10 mL  10 mL Intravenous PRN Lequita Asal, MD   10 mL at 04/17/18 0844      .  PHYSICAL EXAMINATION: ECOG PERFORMANCE STATUS: 0 - Asymptomatic  Vitals:   04/26/21 1012  BP: 121/86  Pulse: 74  Resp: 20  Temp: 98.7 F (37.1 C)   Filed Weights   04/26/21 1012  Weight: 171 lb 8 oz (77.8 kg)    Physical Exam Vitals and nursing note reviewed.  Constitutional:      Comments: Alone; ambulating independently  HENT:     Head: Normocephalic and atraumatic.     Mouth/Throat:     Mouth: Mucous membranes are moist.     Pharynx: Oropharynx is clear. No oropharyngeal exudate.  Eyes:     Extraocular Movements: Extraocular movements intact.     Pupils: Pupils are equal, round, and reactive to light.  Cardiovascular:     Rate and Rhythm: Normal rate and regular rhythm.  Pulmonary:     Effort: No respiratory distress.     Breath sounds: No wheezing.  Abdominal:     General: Bowel  sounds are normal. There is no distension.     Palpations: Abdomen is soft. There is no mass.     Tenderness: There is no abdominal tenderness. There is no guarding or rebound.  Musculoskeletal:        General: No tenderness. Normal range of motion.     Cervical back: Normal range of motion and neck supple.  Skin:    General: Skin is warm.  Neurological:     General: No focal deficit present.     Mental Status: She is alert and oriented to person, place, and time.  Psychiatric:        Mood and Affect: Affect normal.        Behavior: Behavior normal.        Judgment: Judgment normal.     LABORATORY DATA:  I have reviewed the data as listed Lab Results  Component Value Date   WBC 5.4 04/22/2021   HGB 12.4 04/22/2021   HCT 36.3 04/22/2021   MCV 86.4 04/22/2021   PLT 222 04/22/2021   Recent Labs    08/05/20 1329 01/04/21 1005 04/22/21 0958  NA 137 138 139  K 3.4* 4.1 3.7  CL 102 103 103  CO2 _0 GLUCOSE 95 103* 93  BUN _1 CREATININE 0.67 0.71 0.69  CALCIUM 9.1 9.5 9.2  GFRNONAA >60 >60 >60  PROT 7.6 7.7 7.4  ALBUMIN 4.3 4.3 4.4  AST _2 ALT _3 ALKPHOS 43 43 43  BILITOT 0.8 1.0 1.1    RADIOGRAPHIC STUDIES: I have personally reviewed the radiological images as listed and agreed with the findings in the report. No results found.  ASSESSMENT & PLAN:   Carcinoma of upper-outer quadrant of left breast in female, estrogen receptor positive ([place]) # Stage IB multifocal LEFT  breast cancer ER-positive; Her2 positive- on Tamoxifen.  S/p left mastectomy; right prophylactic mastectomy-clinically stable.  However tumor marker slightly elevated compared to 6 months ago [although within normal range]; July 2022 CT scan chest and pelvis negative for any metastatic disease; bilateral mastectomies and mild scarring noted in the left upper lobe/radiation-induced likely. STABLE.   #Patient continues to be on tamoxifen.  No concerns for any recurrence.  I  discussed the option of ovarian suppression [BSO or chemical manipulation-Eligard] given her young age/high risk malignancy.  Discussed slight benefit vs however this has to balance out against the side effects of worsening hot flashes osteoporosis joint aches etc.  No additional meds today.  #I again reviewed the role of adjuvant Zometa every 6 months.  Patient is interested ; we will check with insurance.  Discussed the potential side effects including but not limited to-infusion reaction; hypocalcemia; and Osteo-necrosis of jaw. Recommend ca+Vit D BID.  I sent the message to check on preauthorization.  # Hot flashes: G-1 [sec to summer]-STABLE.  Hold off any pharmacotherapy.  #  Premenopausal [July 5825]-PGFQMK ultrasound[IUD-cystic changes in the endometrium] in place; yearly follow-up with gynecology  # CHEK- 2 mutations: discussed re: colonoscopy starting 40 years; patient will speak to Dr. Carita Pian appt next month]  # DISPOSITION:my chart # follow up in 4 months- MD; 3 days prior-labs-cbc/cmp/ca-27-29 Dr.B     All questions were answered. The patient knows to call the clinic with any problems, questions or concerns.       Cammie Sickle, MD 04/26/2021 11:15 AM

## 2021-04-26 NOTE — Assessment & Plan Note (Addendum)
#  Stage IB multifocal LEFT breast cancer ER-positive; Her2 positive- on Tamoxifen.  S/p left mastectomy; right prophylactic mastectomy-clinically stable.  However tumor marker slightly elevated compared to 6 months ago [although within normal range]; July 2022 CT scan chest and pelvis negative for any metastatic disease; bilateral mastectomies and mild scarring noted in the left upper lobe/radiation-induced likely. STABLE.   #Patient continues to be on tamoxifen.  No concerns for any recurrence.  I discussed the option of ovarian suppression [BSO or chemical manipulation-Eligard] given her young age/high risk malignancy.  Discussed slight benefit vs however this has to balance out against the side effects of worsening hot flashes osteoporosis joint aches etc.  No additional meds today.  #I again reviewed the role of adjuvant Zometa every 6 months.  Patient is interested ; we will check with insurance.  Discussed the potential side effects including but not limited to-infusion reaction; hypocalcemia; and Osteo-necrosis of jaw. Recommend ca+Vit D BID. I sent the message to check on preauthorization.  # Hot flashes: G-1 [sec to summer]-STABLE.  Hold off any pharmacotherapy.  #  Premenopausal [July 2022]-pelvic ultrasound[IUD-cystic changes in the endometrium] in place; yearly follow-up with gynecology  # CHEK- 2 mutations: discussed re: colonoscopy starting 40 years; patient will speak to Dr. Byrnett [awaiting appt next month]  # DISPOSITION:my chart # follow up in 4 months- MD; 3 days prior-labs-cbc/cmp/ca-27-29 Dr.B   

## 2021-05-02 ENCOUNTER — Inpatient Hospital Stay: Payer: BC Managed Care – PPO

## 2021-05-02 ENCOUNTER — Other Ambulatory Visit: Payer: Self-pay

## 2021-05-02 DIAGNOSIS — C50412 Malignant neoplasm of upper-outer quadrant of left female breast: Secondary | ICD-10-CM | POA: Diagnosis not present

## 2021-05-02 MED ORDER — SODIUM CHLORIDE 0.9 % IV SOLN
Freq: Once | INTRAVENOUS | Status: AC
  Filled 2021-05-02: qty 250

## 2021-05-02 MED ORDER — ZOLEDRONIC ACID 4 MG/100ML IV SOLN
4.0000 mg | Freq: Once | INTRAVENOUS | Status: AC
  Administered 2021-05-02: 4 mg via INTRAVENOUS
  Filled 2021-05-02: qty 100

## 2021-05-02 NOTE — Patient Instructions (Signed)

## 2021-05-02 NOTE — Progress Notes (Signed)
Ok to use labs from 11/4 for Zometa today per MD

## 2021-05-30 ENCOUNTER — Other Ambulatory Visit: Payer: Self-pay | Admitting: Internal Medicine

## 2021-05-30 DIAGNOSIS — C50419 Malignant neoplasm of upper-outer quadrant of unspecified female breast: Secondary | ICD-10-CM

## 2021-05-31 ENCOUNTER — Encounter: Payer: Self-pay | Admitting: Internal Medicine

## 2021-05-31 ENCOUNTER — Encounter: Payer: Self-pay | Admitting: Hematology and Oncology

## 2021-05-31 MED ORDER — TAMOXIFEN CITRATE 20 MG PO TABS: 20.0000 mg | ORAL_TABLET | Freq: Every day | ORAL | 1 refills | Status: DC

## 2021-06-14 ENCOUNTER — Other Ambulatory Visit: Payer: Self-pay | Admitting: General Surgery

## 2021-06-14 NOTE — Progress Notes (Signed)
Subjective:  °  ° Patient ID: Denise [name] is a 45 y.o. female. °  °HPI °  °The following portions of the patient's history were reviewed and updated as appropriate. °  °This an established patient is here today for: office visit. The patient is here today to follow up. She had a history of breast cancer and a bilateral mastectomy. °  °  °   °Chief Complaint  °Patient presents with  ° Follow-up  °  °  °BP 108/68    Pulse 84    Temp 36.7 °C (98 °F)    Ht 170.2 cm (5' 7")    Wt 76.2 kg (168 lb)    LMP 01/17/2021    SpO2 98%    BMI 26.31 kg/m²  °  °    °Past Medical History:  °Diagnosis Date  ° Cancer (CMS-HCC) 05/20/2018  °  Left breast clinical T1c,N1 prior to neo-adjuvant chemotherapy. Post neoadjuvant chemotherapy, complete resolution of breast primary, positive axillary nodes 2/3.  Whole breast radiation.  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  ° Fibroadenoma 2016  °  left breast  ° Genetic screening 08/26/2013  ° Monoallelic mutation of CHEK2 gene in female patient 2017  ° Personal history of chemotherapy    ° Personal history of radiation therapy    °  °  °     °Past Surgical History:  °Procedure Laterality Date  ° BREAST EXCISIONAL BIOPSY Right 08/1998  ° BREAST EXCISIONAL BIOPSY Left 08/08/2013  ° BREAST EXCISIONAL BIOPSY Bilateral 09/08/2014  ° OPEN EXCISION BREAST LESION Left 09/2014  ° PERCUTANEOUS BIOPSY BREAST W/NEEDLE LOCALIZATION   03/16/2015  ° BREAST EXCISIONAL BIOPSY Left 11/2017  ° port a cath placement Right 12/17/2017  ° MASTECTOMY SIMPLE Left 05/20/2018  °  No residual cancer post neoadjuvant, 1/3 lymph nodes positive, microscopic foci measuring 1.0 and 2.1 cm in a single node) C.  Whole breast radiation.  ° breast reconstruction with placement of tissue expander and fled hd Left 05/20/2018  ° MASTECTOMY SIMPLE Right 12/09/2018  °  with axillary sentinel node biopsy sclerosing adenosis.  Prophylactic.  ° breast reconstruction with placement of tissue  expander adn fled hd Right 12/09/2018  ° EGD   01/20/2019  °  For history of melena, completed by Dr. Vanco.  No bleeding source identified.  ° removal of bilateral tissue expanders with placement of bilateral breast implants   03/31/2019  ° port a cath removal Right 03/31/2019  °  °  °  °        °OB History   °  Gravida  °2  ° Para  °   ° Term  °   ° Preterm  °   ° AB  °   ° Living  °   °  °  SAB  °   ° IAB  °   ° Ectopic  °   ° Molar  °   ° Multiple  °   ° Live Births  °   °  °  °  Obstetric Comments  °Age at first period 15 °Age of first pregnancy 28 °   °  °   °  °  °Social History  °  °  °     °Socioeconomic History  ° Marital status: Married  °Tobacco Use  ° Smoking status: Never  ° Smokeless tobacco: Never  °Vaping Use  ° Vaping Use: Never used  °Substance   and Sexual Activity   Alcohol use: Not Currently   Drug use: Never   Sexual activity: Yes      Partners: Male      Birth control/protection: Condom             Allergies  Allergen Reactions   Benzoin Compound Rash      Steri-strip ,    Other Unknown      Steri-strip compound benzoin   Adhesive Other (See Comments)      Minor skin irritation   Adhesive Tape-Silicones Other (See Comments)      Minor skin irritation   Meloxicam Rash      Current Medications        Current Outpatient Medications  Medication Sig Dispense Refill   acetaminophen (TYLENOL) 500 MG tablet Take 500 mg by mouth every 6 (six) hours as needed for Pain       ascorbic acid (VITAMIN C ORAL) Take by mouth once daily       CALCIUM ORAL Take by mouth once daily       cholecalciferol (VITAMIN D3) 1000 unit tablet Take by mouth       copper intrauterine contraceptive (PARAGARD T 380A) IUD Insert into the uterus       tamoxifen (NOLVADEX) 20 MG tablet Take 20 mg by mouth       bisacodyL (DULCOLAX) 5 mg EC tablet Take two tablets morning and two tablets afternoon day prior to Miralax prep. 4 tablet 0   blood glucose diagnostic test strip Use 1 each (1 strip total)  once daily Use as instructed. (Patient not taking: Reported on 09/25/2019  ) 100 each 12   blood glucose meter kit Use as directed (Patient not taking: Reported on 09/25/2019  ) 1 each 0   diazePAM (VALIUM) 2 MG tablet TAKE 1 TABLET BY MOUTH EVERY 12 HOURS AS NEEDED FOR MUSCLE SPASM (Patient not taking: No sig reported)       lancing device with lancets kit Use 1 each once daily Use as instructed. (Patient not taking: Reported on 09/25/2019  ) 30 each 12   loratadine (CLARITIN) 10 mg tablet Take 10 mg by mouth once daily    (Patient not taking: Reported on 08/24/2020)       metoprolol succinate (TOPROL-XL) 25 MG XL tablet Take 1 tablet (25 mg total) by mouth once daily (Patient not taking: Reported on 06/08/2020) 90 tablet 0   omeprazole (PRILOSEC) 20 MG DR capsule Take 20 mg by mouth once daily as needed (Patient not taking: Reported on 06/08/2020)       polyethylene glycol (MIRALAX) powder One bottle for colonoscopy prep. Use as directed. 255 g 0    No current facility-administered medications for this visit.             Family History  Problem Relation Age of Onset   Skin cancer Mother     Prostate cancer Father     Depression Sister     Stroke Maternal Grandmother     Stroke Maternal Grandfather     Breast cancer Paternal Grandmother     Alcohol abuse Paternal Grandfather     Breast cancer Maternal Aunt     Ovarian cancer Maternal Aunt     Pancreatic cancer Maternal Aunt     Prostate cancer Maternal Uncle     Breast cancer Paternal Aunt     Alcohol abuse Paternal Uncle     Anxiety Paternal Uncle  Labs and Radiology:  °  °Medical oncology note of May 06, 2021 reviewed.  Now seeing Dr. Brahmanday after Dr. Corcoran separation. °  °In his note, he recommended colonoscopy earlier than baseline. °  °Chek 2 mutation previous identified: °  °No indication for prophylactic ovary removal. °  °  °Review of Systems  °Constitutional: Negative for chills and fever.  °Respiratory:  Negative for cough.   °  °  °   °Objective:  ° Physical Exam °Exam conducted with a chaperone present.  °Constitutional:   °   Appearance: Normal appearance.  °Cardiovascular:  °   Rate and Rhythm: Normal rate and regular rhythm.  °   Pulses: Normal pulses.  °   Heart sounds: Normal heart sounds.  °Pulmonary:  °   Effort: Pulmonary effort is normal.  °   Breath sounds: Normal breath sounds.  °Chest:  °Breasts: °   Right: Absent.  °   Left: Absent.  ° ° °   Comments: Right breast volume about 10-15% larger than the left. ° °Slight rippling 3 o'clock position of the left implant. ° °Bilateral nipple tattoos. ° ° °Musculoskeletal:  °   Cervical back: Neck supple.  °Lymphadenopathy:  °   Upper Body:  °   Right upper body: No supraclavicular or axillary adenopathy.  °   Left upper body: No supraclavicular or axillary adenopathy.  °Skin: °   General: Skin is warm and dry.  °Neurological:  °   Mental Status: She is alert and oriented to person, place, and time.  °Psychiatric:     °   Mood and Affect: Mood normal.     °   Behavior: Behavior normal.  °  °  °  °   °Assessment:  °   °Doing well now 30 months post right simple mastectomy in 3 years post left simple mastectomy with sentinel node biopsy. °   °Plan:  °   °Indications for colonoscopy reviewed.  Risks associated with procedure discussed. °  °Is to be scheduled convenient date. °  °We will plan for clinical exam in 1 year. °   °This note is partially prepared by Michele Bailey, CMA acting as a scribe in the presence of Dr.  , MD.  °  °The documentation recorded by the scribe accurately reflects the service I personally performed and the decisions made by me.  °  ° W. , MD FACS °  °

## 2021-07-12 ENCOUNTER — Other Ambulatory Visit: Payer: BC Managed Care – PPO

## 2021-07-12 ENCOUNTER — Ambulatory Visit: Payer: BC Managed Care – PPO | Admitting: Internal Medicine

## 2021-08-19 ENCOUNTER — Other Ambulatory Visit: Payer: Self-pay

## 2021-08-19 ENCOUNTER — Inpatient Hospital Stay: Payer: BC Managed Care – PPO | Attending: Internal Medicine

## 2021-08-19 DIAGNOSIS — Z923 Personal history of irradiation: Secondary | ICD-10-CM | POA: Diagnosis not present

## 2021-08-19 DIAGNOSIS — Z803 Family history of malignant neoplasm of breast: Secondary | ICD-10-CM | POA: Insufficient documentation

## 2021-08-19 DIAGNOSIS — Z79899 Other long term (current) drug therapy: Secondary | ICD-10-CM | POA: Insufficient documentation

## 2021-08-19 DIAGNOSIS — Z1501 Genetic susceptibility to malignant neoplasm of breast: Secondary | ICD-10-CM | POA: Insufficient documentation

## 2021-08-19 DIAGNOSIS — Z9221 Personal history of antineoplastic chemotherapy: Secondary | ICD-10-CM | POA: Diagnosis not present

## 2021-08-19 DIAGNOSIS — Z8041 Family history of malignant neoplasm of ovary: Secondary | ICD-10-CM | POA: Insufficient documentation

## 2021-08-19 DIAGNOSIS — C50412 Malignant neoplasm of upper-outer quadrant of left female breast: Secondary | ICD-10-CM | POA: Diagnosis present

## 2021-08-19 DIAGNOSIS — Z170 Estrogen receptor positive status [ER+]: Secondary | ICD-10-CM | POA: Diagnosis not present

## 2021-08-19 DIAGNOSIS — Z800 Family history of malignant neoplasm of digestive organs: Secondary | ICD-10-CM | POA: Insufficient documentation

## 2021-08-19 DIAGNOSIS — Z79810 Long term (current) use of selective estrogen receptor modulators (SERMs): Secondary | ICD-10-CM | POA: Insufficient documentation

## 2021-08-19 DIAGNOSIS — Z9013 Acquired absence of bilateral breasts and nipples: Secondary | ICD-10-CM | POA: Diagnosis not present

## 2021-08-19 DIAGNOSIS — Z1509 Genetic susceptibility to other malignant neoplasm: Secondary | ICD-10-CM | POA: Diagnosis not present

## 2021-08-19 DIAGNOSIS — R232 Flushing: Secondary | ICD-10-CM | POA: Insufficient documentation

## 2021-08-19 DIAGNOSIS — Z1502 Genetic susceptibility to malignant neoplasm of ovary: Secondary | ICD-10-CM | POA: Insufficient documentation

## 2021-08-19 LAB — CBC WITH DIFFERENTIAL/PLATELET
Abs Immature Granulocytes: 0.02 10*3/uL (ref 0.00–0.07)
Basophils Absolute: 0.1 10*3/uL (ref 0.0–0.1)
Basophils Relative: 1 %
Eosinophils Absolute: 0.1 10*3/uL (ref 0.0–0.5)
Eosinophils Relative: 1 %
HCT: 38.1 % (ref 36.0–46.0)
Hemoglobin: 12.6 g/dL (ref 12.0–15.0)
Immature Granulocytes: 0 %
Lymphocytes Relative: 31 %
Lymphs Abs: 1.7 10*3/uL (ref 0.7–4.0)
MCH: 28.8 pg (ref 26.0–34.0)
MCHC: 33.1 g/dL (ref 30.0–36.0)
MCV: 87.2 fL (ref 80.0–100.0)
Monocytes Absolute: 0.3 10*3/uL (ref 0.1–1.0)
Monocytes Relative: 6 %
Neutro Abs: 3.2 10*3/uL (ref 1.7–7.7)
Neutrophils Relative %: 61 %
Platelets: 221 10*3/uL (ref 150–400)
RBC: 4.37 MIL/uL (ref 3.87–5.11)
RDW: 13.3 % (ref 11.5–15.5)
WBC: 5.4 10*3/uL (ref 4.0–10.5)
nRBC: 0 % (ref 0.0–0.2)

## 2021-08-19 LAB — COMPREHENSIVE METABOLIC PANEL
ALT: 19 U/L (ref 0–44)
AST: 23 U/L (ref 15–41)
Albumin: 4.2 g/dL (ref 3.5–5.0)
Alkaline Phosphatase: 28 U/L — ABNORMAL LOW (ref 38–126)
Anion gap: 7 (ref 5–15)
BUN: 14 mg/dL (ref 6–20)
CO2: 28 mmol/L (ref 22–32)
Calcium: 9.1 mg/dL (ref 8.9–10.3)
Chloride: 102 mmol/L (ref 98–111)
Creatinine, Ser: 0.65 mg/dL (ref 0.44–1.00)
GFR, Estimated: >60 mL/min (ref >60)
Glucose, Bld: 110 mg/dL — ABNORMAL HIGH (ref 70–99)
Potassium: 3.8 mmol/L (ref 3.5–5.1)
Sodium: 137 mmol/L (ref 135–145)
Total Bilirubin: 0.7 mg/dL (ref 0.3–1.2)
Total Protein: 7.6 g/dL (ref 6.5–8.1)

## 2021-08-20 LAB — CANCER ANTIGEN 27.29: CA 27.29: 22.9 U/mL (ref 0.0–38.6)

## 2021-08-23 ENCOUNTER — Inpatient Hospital Stay: Payer: BC Managed Care – PPO | Admitting: Internal Medicine

## 2021-08-23 ENCOUNTER — Other Ambulatory Visit: Payer: Self-pay

## 2021-08-23 ENCOUNTER — Encounter: Payer: Self-pay | Admitting: Internal Medicine

## 2021-08-23 DIAGNOSIS — Z170 Estrogen receptor positive status [ER+]: Secondary | ICD-10-CM

## 2021-08-23 DIAGNOSIS — C50412 Malignant neoplasm of upper-outer quadrant of left female breast: Secondary | ICD-10-CM | POA: Diagnosis not present

## 2021-08-23 DIAGNOSIS — C50419 Malignant neoplasm of upper-outer quadrant of unspecified female breast: Secondary | ICD-10-CM | POA: Diagnosis not present

## 2021-08-23 MED ORDER — TAMOXIFEN CITRATE 20 MG PO TABS: 20.0000 mg | ORAL_TABLET | Freq: Every day | ORAL | 1 refills | Status: DC

## 2021-08-23 MED ORDER — VENLAFAXINE HCL ER 37.5 MG PO CP24: 37.5000 mg | ORAL_CAPSULE | Freq: Every day | ORAL | 3 refills | Status: DC

## 2021-08-23 NOTE — Progress Notes (Signed)
[facility] NOTE  Patient Care Team: Venetia Maxon, Rolanda Jay, PA-C as PCP - General (Family Medicine) Bary Castilla, Forest Gleason, MD (General Surgery) Gae Dry, MD as Referring Physician (Obstetrics and Gynecology) Lequita Asal, MD (Inactive) as Referring Physician (Hematology and Oncology) Rico Junker, RN as Oncology Nurse Navigator Noreene Filbert, MD as Referring Physician (Radiation Oncology) [place] Pink, MD as Consulting Physician (Family Medicine)  CHIEF COMPLAINTS/PURPOSE OF CONSULTATION: Breast cancer  #  Oncology History Overview Note  Denise [name] is a 46 y.o. 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  Pathology revealed no residual carcinoma in the breast.  One of three sentinel lymph nodes were positive.  One lymph node had 2 metastatic foci (2.1 mm and 1 mm).  Pathologic stage revealed ypT0 ypN1a.   Index breast mass and axillary node biopsy on 12/06/2017 revealed grade II invasive ductal carcinoma with calcifications at the 11 o'clock position.  There was metastatic carcinoma in 1 of 1 lymph nodes.  Tumor was ER + (100%), PR + (100%), and Ki67 5%.  Her2/neu was 3+ by IHC (heterogeneous) and FISH +.   Diagnostic left mammogram and ultrasound on 12/06/2017 revealed a 1.7 x 1.3 x 1.3 cm irregular hypoechoic mass with internal calcifications at the 11 o'clock position 2 cm from the nipple with internal calcifications. There was a similar appearing 0.9 x 0.8 x 0.7 cm mass at the 11 o'clock position 4 cm from the nipple (2.4 cm from the index lesion).  There were suspicious, segmental calcifications originating from the index lesion and extending 5 cm posteriorly.  There was a 0.6 cm morphologically abnormal left axillary lymph node.   Bilateral breast MRI on 12/10/2017 revealed a papilloma in the right breast  The recently  biopsied malignancy in the left breast (15 x 16 x 16 mm) was identified. The adjacent and more superiorly located 11 o'clock mass (7 x 12 mm) seen on recent ultrasound, 4 cm from the nipple on ultrasound, was also identified. There were 2 probable satellite lesions (7 mm, medial lesion; posterior and lateral lesion). The total span of malignancy was 3 x 3.1 x 4.5 cm.  There were calcifications extending up to 5 cm posterior to the biopsied malignancy which were highly suspicious on mammography but not appreciated.  There was a mass in the lower outer left breast measured 5 mm, representing a change.  The known metastatic node in the left axilla was identified. A node along the posterolateral margin of the pectoralis minor (cortex 5 mm) was nonspecific but at least somewhat suspicious.   Chest, abdomen, and pelvic CT on 12/19/2017 revealed a solitary enlarged biopsy-proven metastatic left axillary lymph node.  There were no additional findings suspicious for metastatic disease in the chest, abdomen or pelvis.  There was a nonspecific tiny sclerotic upper right sacral lesion, more likely a benign bone island. Bone scintigraphy correlation versus follow-up CT could be considered.   Bone scan on 01/11/2018 was negative for metastatic pattern. No areas of focal tracer uptake. Area of concern in sacrum on CT likely represents benign bone island.    [facility] genetic testing on 08/26/2013 was negative for BRCA1 and 2. 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  She has a family history significant for breast, ovarian, pancreatic, and prostate cancer.   She received 6 cycles of TCHP chemotherapy (12/21/2017 - 04/17/2018) with Margarette Canada  support. Cycle #3 was held due to elevated LFTs on 02/04/2018. She received Herceptin + Perjeta alone (04/30/2018 - 05/30/2018).  She is s/p cycle #1 Kadcyla (06/20/2018).   She began breast radiation on 06/25/2018   Echocardiograms:  12/20/2017 - EF of  60-65%, 03/27/2018 - EF of 60-65%, and 06/18/2018 - EF of 55-60%.   She has a history of elevated LFTs.  Hepatitis B and C serologies were negative on 02/04/2018.  RUQ ultrasound on 02/06/2018 revealed a small anterior gallbladder wall polyp. There was no evidence of cholelithiasis or cholecystis. CBD measured normal at 2 mm, with no evidence of choledocholithiasis. There was normal direction of blood flow towards the liver noted.   Carcinoma of upper-outer quadrant of left breast in female, estrogen receptor positive ([place])  09/17/2014 Initial Diagnosis   Malignant neoplasm of upper-outer quadrant of female breast ([place])   12/21/2017 - 04/18/2018 Chemotherapy   The patient had dexamethasone (DECADRON) 4 MG tablet, 8 mg, Oral, 2 times daily, 2 of 2 cycles palonosetron (ALOXI) injection 0.25 mg, 0.25 mg, Intravenous,  Once, 6 of 6 cycles Administration: 0.25 mg (12/21/2017), 0.25 mg (01/14/2018), 0.25 mg (03/28/2018), 0.25 mg (04/17/2018), 0.25 mg (02/11/2018), 0.25 mg (03/07/2018) pegfilgrastim (NEULASTA ONPRO KIT) injection 6 mg, 6 mg, Subcutaneous, Once, 1 of 1 cycle Administration: 6 mg (12/21/2017) pegfilgrastim-cbqv (UDENYCA) injection 6 mg, 6 mg, Subcutaneous, Once, 6 of 6 cycles Administration: 6 mg (01/15/2018), 6 mg (03/29/2018), 6 mg (04/18/2018), 6 mg (02/12/2018), 6 mg (03/08/2018) trastuzumab (HERCEPTIN) 600 mg in sodium chloride 0.9 % 250 mL chemo infusion, 672 mg, Intravenous,  Once, 6 of 6 cycles Administration: 600 mg (12/21/2017), 450 mg (01/14/2018), 500 mg (03/28/2018), 500 mg (04/17/2018), 500 mg (02/11/2018), 500 mg (03/07/2018) CARBOplatin (PARAPLATIN) 870 mg in sodium chloride 0.9 % 500 mL chemo infusion, 890 mg (100 % of original dose 885 mg), Intravenous,  Once, 6 of 6 cycles Dose modification:   (original dose 885 mg, Cycle 1),   (original dose 885 mg, Cycle 2),   (original dose 885 mg, Cycle 5),   (original dose 885 mg, Cycle 6),   (original dose 885 mg, Cycle 3),   (original dose 885 mg,  Cycle 4) Administration: 870 mg (12/21/2017), 870 mg (01/14/2018), 870 mg (03/28/2018), 870 mg (04/17/2018), 870 mg (02/11/2018), 870 mg (03/07/2018) DOCEtaxel (TAXOTERE) 150 mg in sodium chloride 0.9 % 250 mL chemo infusion, 75 mg/m2 = 150 mg, Intravenous,  Once, 6 of 6 cycles Dose modification: 55 mg/m2 (original dose 75 mg/m2, Cycle 5, Reason: Change in LFTs, Comment: per pharmacy) Administration: 150 mg (12/21/2017), 150 mg (01/14/2018), 110 mg (03/28/2018), 150 mg (04/17/2018), 150 mg (02/11/2018), 150 mg (03/07/2018) pertuzumab (PERJETA) 840 mg in sodium chloride 0.9 % 250 mL chemo infusion, 840 mg, Intravenous, Once, 6 of 6 cycles Administration: 840 mg (12/21/2017), 420 mg (01/14/2018), 420 mg (03/28/2018), 420 mg (04/17/2018), 420 mg (02/11/2018), 420 mg (03/07/2018) fosaprepitant (EMEND) 150 mg, dexamethasone (DECADRON) 12 mg in sodium chloride 0.9 % 145 mL IVPB, , Intravenous,  Once, 6 of 6 cycles Administration:  (12/21/2017),  (01/14/2018),  (03/28/2018),  (04/17/2018),  (02/11/2018),  (03/07/2018)   for chemotherapy treatment.     05/09/2018 - 05/30/2018 Chemotherapy   The patient had trastuzumab (HERCEPTIN) 500 mg in sodium chloride 0.9 % 250 mL chemo infusion, 504 mg, Intravenous,  Once, 2 of 11 cycles Administration: 500 mg (05/30/2018), 500 mg (05/09/2018) pertuzumab (PERJETA) 420 mg in sodium chloride 0.9 % 250 mL chemo infusion, 420 mg, Intravenous, Once, 2 of 11  cycles Administration: 420 mg (05/30/2018), 420 mg (05/09/2018)   for chemotherapy treatment.     01/17/2019 -  Chemotherapy   The patient had trastuzumab-anns (KANJINTI) 500 mg in sodium chloride 0.9 % 250 mL chemo infusion, 441 mg (100 % of original dose 6 mg/kg), Intravenous,  Once, 3 of 3 cycles Dose modification: 6 mg/kg (original dose 6 mg/kg, Cycle 1, Reason: Provider Judgment), 2 mg/kg (original dose 6 mg/kg, Cycle 3, Reason: Provider Judgment, Comment: 1 week short of 52 weeks of Her2/neu based treatment) Administration: 500  mg (01/24/2019), 150 mg (02/14/2019)   for chemotherapy treatment.       HISTORY OF PRESENTING ILLNESS: Ambulating independently.  Alone. BHAKTI LABELLA 46 y.o.  female with a history of stage Ib ER/PR positive HER2/neu positive breast cancer currently on tamoxifen is here for follow-up/review results of tumor marker.  C/O hot flashes waking her up at night.  Hot flashes are worsened in the spring.  Patient denies any new onset of bone pain or joint pains.  Chronic mild hot flashes.  Not any worse.  No shortness of breath or cough.  Review of Systems  Constitutional:  Negative for chills, diaphoresis, fever, malaise/fatigue and weight loss.  HENT:  Negative for nosebleeds and sore throat.   Eyes:  Negative for double vision.  Respiratory:  Negative for cough, hemoptysis, sputum production, shortness of breath and wheezing.   Cardiovascular:  Negative for chest pain, palpitations, orthopnea and leg swelling.  Gastrointestinal:  Negative for abdominal pain, blood in stool, constipation, diarrhea, heartburn, melena, nausea and vomiting.  Genitourinary:  Negative for dysuria, frequency and urgency.  Musculoskeletal:  Negative for back pain and joint pain.  Skin: Negative.  Negative for itching and rash.  Neurological:  Negative for dizziness, tingling, focal weakness, weakness and headaches.  Endo/Heme/Allergies:  Does not bruise/bleed easily.  Psychiatric/Behavioral:  Negative for depression. The patient is not nervous/anxious and does not have insomnia.     MEDICAL HISTORY:  Past Medical History:  Diagnosis Date   Abnormal breast biopsy    [facility], [name]   Breast cancer [facility]) 11/2017   left breast; ER/PR/Her2neu POS   Family history of breast cancer    Family history of ovarian cancer    Fibroadenoma of breast, left 2000, 2016   Genetic screening 08/26/2013   BRCA/BART negative/[facility], CHEK2 POS 2017   Increased risk of breast cancer 2017   IBIS=47%   Left breast lump  06/18/2017   Fluid/drained [name]   Migraine    Monoallelic mutation of CHEK2 gene in female patient 2017   increased risk of breast and colon cancer   Personal history of chemotherapy    Personal history of radiation therapy     SURGICAL HISTORY: Past Surgical History:  Procedure Laterality Date   BREAST BIOPSY Bilateral 09/08/14   neg   BREAST BIOPSY Left 03/16/2015   Procedure: BREAST BIOPSY WITH NEEDLE LOCALIZATION;  Surgeon: [name], MD;  Location: [facility];  Service: General;  Laterality: Left;   BREAST BIOPSY Left 11/2017   positive   BREAST CYST ASPIRATION Left 06/18/2017   cyst aspiration   BREAST EXCISIONAL BIOPSY Left 09/2014   BREAST EXCISIONAL BIOPSY Right    age 89's   BREAST RECONSTRUCTION WITH PLACEMENT OF TISSUE EXPANDER AND FLEX HD (ACELLULAR HYDRATED DERMIS) Left 05/20/2018   Procedure: BREAST RECONSTRUCTION WITH PLACEMENT OF TISSUE EXPANDER AND FLEX HD (ACELLULAR HYDRATED DERMIS);  Surgeon: [name], DO;  Location: [facility];  Service:  Plastics;  Laterality: Left;   BREAST RECONSTRUCTION WITH PLACEMENT OF TISSUE EXPANDER AND FLEX HD (ACELLULAR HYDRATED DERMIS) Right 12/09/2018   Procedure: BREAST RECONSTRUCTION WITH PLACEMENT OF TISSUE EXPANDER AND FLEX HD (ACELLULAR HYDRATED DERMIS);  Surgeon: [name], DO;  Location: [facility];  Service: Plastics;  Laterality: Right;   BREAST SURGERY Right March 2000   benign fibroadenoma   BREAST SURGERY Left 08/08/13   excision   BREAST SURGERY Left 09/23/14   excision   ESOPHAGOGASTRODUODENOSCOPY (EGD) WITH PROPOFOL N/A 01/20/2019   Procedure: ESOPHAGOGASTRODUODENOSCOPY (EGD) WITH PROPOFOL;  Surgeon: [name], MD;  Location: [facility];  Service: Gastroenterology;  Laterality: N/A;   MASTECTOMY Left 05/20/2018   MASTECTOMY W/ SENTINEL NODE BIOPSY Left 05/20/2018   Procedure: MASTECTOMY WITH SENTINEL LYMPH NODE BIOPSY;  Surgeon: [name], MD;  Location: [facility];  Service:  General;  Laterality: Left;   PORT-A-CATH REMOVAL Right 03/31/2019   Procedure: REMOVAL PORT-A-CATH;  Surgeon: [name], DO;  Location: [facility];  Service: Plastics;  Laterality: Right;   PORTACATH PLACEMENT Right 12/17/2017   Procedure: INSERTION PORT-A-CATH;  Surgeon: [name], MD;  Location: [facility];  Service: General;  Laterality: Right;   REMOVAL OF BILATERAL TISSUE EXPANDERS WITH PLACEMENT OF BILATERAL BREAST IMPLANTS Bilateral 03/31/2019   Procedure: REMOVAL OF BILATERAL TISSUE EXPANDERS WITH PLACEMENT OF BILATERAL BREAST IMPLANTS;  Surgeon: [name], DO;  Location: [facility];  Service: Plastics;  Laterality: Bilateral;  2 hours   SIMPLE MASTECTOMY WITH AXILLARY SENTINEL NODE BIOPSY Right 12/09/2018   Procedure: SIMPLE MASTECTOMY RIGHT;  Surgeon: [name], MD;  Location: [facility];  Service: General;  Laterality: Right;    SOCIAL HISTORY: Social History   Socioeconomic History   Marital status: Married    Spouse name: Not on file   Number of children: Not on file   Years of education: Not on file   Highest education level: Not on file  Occupational History   Not on file  Tobacco Use   Smoking status: Never   Smokeless tobacco: Never  Vaping Use   Vaping Use: Never used  Substance and Sexual Activity   Alcohol use: Yes    Comment: occasionally   Drug use: No   Sexual activity: Yes    Birth control/protection: I.U.D.    Comment: Paragard  Other Topics Concern   Not on file  Social History Narrative   Not on file   Social Determinants of Health   Financial Resource Strain: Not on file  Food Insecurity: Not on file  Transportation Needs: Not on file  Physical Activity: Not on file  Stress: Not on file  Social Connections: Not on file  Intimate Partner Violence: Not on file    FAMILY HISTORY: Family History  Problem Relation Age of Onset   Prostate cancer Father 77       requiring tx/hormones   Breast cancer Paternal  Grandmother 88   Breast cancer Maternal Aunt 60   Ovarian cancer Maternal Aunt 28   Breast cancer Paternal Aunt 52   Pancreatic cancer Maternal Aunt 75    ALLERGIES:  is allergic to steri-strip compound benzoin [benzoin compound], silicone, and tape.  MEDICATIONS:  Current Outpatient Medications  Medication Sig Dispense Refill   acetaminophen (TYLENOL) 500 MG tablet Take 1,000 mg by mouth every 6 (six) hours as needed for moderate pain or headache.      cholecalciferol (VITAMIN D3) 25 MCG (1000 UNIT) tablet Take 1,000 Units by  mouth daily.     venlafaxine XR (EFFEXOR-XR) 37.5 MG 24 hr capsule Take 1 capsule (37.5 mg total) by mouth daily with breakfast. 90 capsule 3   vitamin C (ASCORBIC ACID) 500 MG tablet Take 500 mg by mouth daily.     tamoxifen (NOLVADEX) 20 MG tablet Take 1 tablet (20 mg total) by mouth daily. 90 tablet 1   Current Facility-Administered Medications  Medication Dose Route Frequency Provider Last Rate Last Admin   paragard intrauterine copper IUD   Intrauterine Once Copland, Alicia B, PA-C       Facility-Administered Medications Ordered in Other Visits  Medication Dose Route Frequency Provider Last Rate Last Admin   sodium chloride flush (NS) 0.9 % injection 10 mL  10 mL Intravenous PRN Lequita Asal, MD   10 mL at 04/17/18 0844      .  PHYSICAL EXAMINATION: ECOG PERFORMANCE STATUS: 0 - Asymptomatic  Vitals:   08/23/21 1011  BP: 121/78  Pulse: 85  Temp: 98.9 F (37.2 C)  SpO2: 99%   Filed Weights   08/23/21 1011  Weight: 173 lb 6.4 oz (78.7 kg)    Physical Exam Vitals and nursing note reviewed.  Constitutional:      Comments: Alone; ambulating independently  HENT:     Head: Normocephalic and atraumatic.     Mouth/Throat:     Mouth: Mucous membranes are moist.     Pharynx: Oropharynx is clear. No oropharyngeal exudate.  Eyes:     Extraocular Movements: Extraocular movements intact.     Pupils: Pupils are equal, round, and reactive to  light.  Cardiovascular:     Rate and Rhythm: Normal rate and regular rhythm.  Pulmonary:     Effort: No respiratory distress.     Breath sounds: No wheezing.  Abdominal:     General: Bowel sounds are normal. There is no distension.     Palpations: Abdomen is soft. There is no mass.     Tenderness: There is no abdominal tenderness. There is no guarding or rebound.  Musculoskeletal:        General: No tenderness. Normal range of motion.     Cervical back: Normal range of motion and neck supple.  Skin:    General: Skin is warm.  Neurological:     General: No focal deficit present.     Mental Status: She is alert and oriented to person, place, and time.  Psychiatric:        Mood and Affect: Affect normal.        Behavior: Behavior normal.        Judgment: Judgment normal.    Latest Reference Range & Units Most Recent 05/09/19 09:50 08/12/19 08:18 12/04/19 13:10 04/08/20 13:51 08/05/20 13:29 01/04/21 10:05 04/22/21 09:58 08/19/21 10:45  CA 27.29 0.0 - 38.6 U/mL 22.9 08/19/21 10:45 21.9 19.9 18.3 16.8 17.7 22.9 19.7 22.9    LABORATORY DATA:  I have reviewed the data as listed Lab Results  Component Value Date   WBC 5.4 08/19/2021   HGB 12.6 08/19/2021   HCT 38.1 08/19/2021   MCV 87.2 08/19/2021   PLT 221 08/19/2021   Recent Labs    01/04/21 1005 04/22/21 0958 08/19/21 1045  NA 138 139 137  K 4.1 3.7 3.8  CL 103 103 102  CO2 _0 GLUCOSE 103* 93 110*  BUN _1 CREATININE 0.71 0.69 0.65  CALCIUM 9.5 9.2 9.1  GFRNONAA >60 >60 >60  PROT 7.7 7.4 7.6  ALBUMIN 4.3 4.4 4.2  AST _0 ALT _1 ALKPHOS 43 43 28*  BILITOT 1.0 1.1 0.7    RADIOGRAPHIC STUDIES: I have personally reviewed the radiological images as listed and agreed with the findings in the report. No results found.  ASSESSMENT & PLAN:   Carcinoma of upper-outer quadrant of left breast in female, estrogen receptor positive ([place]) # Stage IB multifocal LEFT breast cancer ER-positive; Her2  positive- on Tamoxifen.  S/p left mastectomy; right prophylactic mastectomy-clinically STABLE. July 2022 CT scan chest and pelvis negative for any metastatic disease; bilateral mastectomies and mild scarring noted in the left upper lobe/radiation-induced likely.    #Patient continues to be on adjuvant tamoxifen.  No concerns for any recurrence.  New refill sent.  Continue Zometa adjuvantly every 6 months. [#1 on Nov 14th, 2022].   #Tumor marker 22.9; same as in July 2022-up-and-down otherwise no significant concerns noted.  Monitor for now.   # Hot flashes-G-2-3; patient previously on Effexor.  Recommend effexor 37.5 mg.  If not improved then recommend going up to 75.  # Hot flashes: G-1 [sec to summer]-STABLE.  Hold off any pharmacotherapy.  #  Premenopausal [July 4481]-EHUDJS ultrasound[IUD-cystic changes in the endometrium] in place; yearly follow-up with gynecology  # CHEK- 2 mutations: discussed re: colonoscopy starting 40 years; awaiting April 11th- colonoscopy [Dr.Byrnett]    #Screening for osteoporosis-recommend bone density.  Continue calcium plus vitamin D [? 6000 u]; await vitamin D levels.  my chart # DISPOSITION: # follow up in second week of June- MD; 3 days prior-labs-cbc/cmp/ca-27-29;Vit D 25OH levels; Zometa ; BMD priorDr.B      All questions were answered. The patient knows to call the clinic with any problems, questions or concerns.       Cammie Sickle, MD 08/23/2021 11:24 AM

## 2021-08-23 NOTE — Assessment & Plan Note (Addendum)
#  Stage IB multifocal LEFT breast cancer ER-positive; Her2 positive- on Tamoxifen.  S/p left mastectomy; right prophylactic mastectomy-clinically STABLE. July 2022 CT scan chest and pelvis negative for any metastatic disease; bilateral mastectomies and mild scarring noted in the left upper lobe/radiation-induced likely.  ? ? ?#Patient continues to be on adjuvant tamoxifen.  No concerns for any recurrence.  New refill sent.  Continue Zometa adjuvantly every 6 months. [#1 on Nov 14th, 2022].  ? ?#Tumor marker 22.9; same as in July 2022-up-and-down otherwise no significant concerns noted.  Monitor for now.  ? ?# Hot flashes-G-2-3; patient previously on Effexor.  Recommend effexor 37.5 mg.  If not improved then recommend going up to 75. ? ?# Hot flashes: G-1 [sec to summer]-STABLE.  Hold off any pharmacotherapy. ? ?#  Premenopausal [July 2022]-pelvic ultrasound[IUD-cystic changes in the endometrium] in place; yearly follow-up with gynecology ? ?# CHEK- 2 mutations: discussed re: colonoscopy starting 40 years; awaiting April 11th- colonoscopy [Dr.Byrnett]  ? ? ?#Screening for osteoporosis-recommend bone density.  Continue calcium plus vitamin D [? 6000 u]; await vitamin D levels. ? ?my chart ?# DISPOSITION: ?# follow up in second week of June- MD; 3 days prior-labs-cbc/cmp/ca-27-29;Vit D 25OH levels; Zometa ; BMD priorDr.B ? ? ?

## 2021-08-23 NOTE — Progress Notes (Signed)
C/O hot flashes waking her up at night. ?

## 2021-08-26 ENCOUNTER — Ambulatory Visit: Payer: BC Managed Care – PPO | Admitting: Plastic Surgery

## 2021-09-01 ENCOUNTER — Telehealth: Payer: Self-pay

## 2021-09-01 NOTE — Telephone Encounter (Signed)
Front desk will call and make an appointment ?

## 2021-09-01 NOTE — Telephone Encounter (Signed)
Patient sent a message through scheduling pool, "My left breast implant is pinching something on the lower outer edge of my breast. I am wondering if anything can be done about that." Anyway to work patient in for an appointment?  ?

## 2021-09-06 ENCOUNTER — Encounter: Payer: Self-pay | Admitting: Internal Medicine

## 2021-09-27 ENCOUNTER — Encounter: Payer: Self-pay | Admitting: Surgical

## 2021-09-27 ENCOUNTER — Ambulatory Visit: Payer: BC Managed Care – PPO | Admitting: Surgical

## 2021-09-27 ENCOUNTER — Encounter: Payer: Self-pay | Admitting: General Surgery

## 2021-09-27 DIAGNOSIS — Z9889 Other specified postprocedural states: Secondary | ICD-10-CM

## 2021-09-27 NOTE — Progress Notes (Signed)
? ?  Referring Provider ?Whitaker, [employer], PA-C ?[facility] ?[place],  [place] [address]  ? ?CC: No chief complaint on file. ?   ? ?KEYARIA LAWSON is an 46 y.o. female.  ?HPI: Patient is a 46 year old female here for follow-up after bilateral breast reconstruction with [name].  She is here today for concerns about a poking sensation in her left breast.  Of note she had radiation to her left breast. ? ?She initially underwent left mastectomy with placement of tissue expander on 05/20/2018 ?She subsequently underwent right mastectomy and placement of tissue expander on 12/09/2018 ? ?She had bilateral breast tissue expanders exchanged for bilateral breast silicone implants on 84/13/2440.  She had Mentor smooth round high-profile gel 535 cc implants placed bilaterally. ? ?Patient reports she has had some pinching in the left lateral breast, reports that has been ongoing for approximately 1 year.  She reports recently it has gotten much worse and more frequent.  She is not aware of any activities that provoke the pinching.  Nothing makes it better or worse, that she is aware of.  She has not felt any abnormal lumps or bumps. ? ?Review of Systems ?General: No fevers or chills ? ?Physical Exam ? ?  08/23/2021  ? 10:11 AM 05/02/2021  ?  3:36 PM 04/26/2021  ? 10:12 AM  ?Vitals with BMI  ?Height   '5\' 7"'$   ?Weight 173 lbs 6 oz  171 lbs 8 oz  ?BMI   26.85  ?Systolic 102 725 366  ?Diastolic 78 89 86  ?Pulse 85 77 74  ?  ?General:  No acute distress,  Alert and oriented, Non-Toxic, Normal speech and affect ?Breast: Bilateral breast status post breast reconstruction with bilateral breast implants.  Bilateral nipple areolar tattoos in place.  Right breast implant is soft, capsule is not contracted.  Left breast implant is soft, however slightly firmer than the right side.  There is some firmness in the capsule, there is no capsular contracture.  She has some tenderness in the left lateral axilla with palpation.  No  subcutaneous fluid collections noted.  Incisions are CDI.  No significant radiation skin changes noted. ? ?Assessment/Plan ?Discussed with patient that the pinching is likely result of the capsule being firm.  Discussed options for improvement which include physical therapy as a initial intervention.  We discussed surgical options are also possible, however with the history of radiation she would be at an increased risk of postoperative complications and would be considered one of the later options if she does not have improvement with physical therapy and stretching. ? ?PT referral sent. ?Pictures were taken and placed in the patient's chart with patient's permission. ?All the patient's questions were answered to her content.  We will schedule follow-up in 3 months to reevaluate. ? ?Carola Rhine Nayab Aten ?09/27/2021, 8:28 AM  ? ? ?    ?

## 2021-09-28 ENCOUNTER — Encounter: Payer: Self-pay | Admitting: General Surgery

## 2021-09-28 ENCOUNTER — Ambulatory Visit
Admission: RE | Admit: 2021-09-28 | Discharge: 2021-09-28 | Disposition: A | Discharge status: Home / Self Care | Payer: BC Managed Care – PPO | Attending: General Surgery | Admitting: General Surgery

## 2021-09-28 ENCOUNTER — Ambulatory Visit: Payer: BC Managed Care – PPO | Admitting: Certified Registered"

## 2021-09-28 ENCOUNTER — Encounter
Admission: RE | Disposition: A | Discharge status: Home / Self Care | Payer: Self-pay | Source: Home / Self Care | Attending: General Surgery

## 2021-09-28 DIAGNOSIS — Z1211 Encounter for screening for malignant neoplasm of colon: Secondary | ICD-10-CM | POA: Insufficient documentation

## 2021-09-28 DIAGNOSIS — I10 Essential (primary) hypertension: Secondary | ICD-10-CM | POA: Diagnosis not present

## 2021-09-28 DIAGNOSIS — Z853 Personal history of malignant neoplasm of breast: Secondary | ICD-10-CM | POA: Insufficient documentation

## 2021-09-28 DIAGNOSIS — Z79899 Other long term (current) drug therapy: Secondary | ICD-10-CM | POA: Insufficient documentation

## 2021-09-28 HISTORY — DX: Essential (primary) hypertension: I10

## 2021-09-28 HISTORY — PX: COLONOSCOPY WITH PROPOFOL: SHX5780

## 2021-09-28 SURGERY — COLONOSCOPY WITH PROPOFOL
Anesthesia: General

## 2021-09-28 MED ORDER — PROPOFOL 10 MG/ML IV BOLUS
INTRAVENOUS | Status: DC | PRN
  Administered 2021-09-28: 60 mg via INTRAVENOUS

## 2021-09-28 MED ORDER — SODIUM CHLORIDE 0.9 % IV SOLN: INTRAVENOUS | Status: DC

## 2021-09-28 MED ORDER — PROPOFOL 500 MG/50ML IV EMUL
INTRAVENOUS | Status: AC
  Filled 2021-09-28: qty 350

## 2021-09-28 MED ORDER — GLYCOPYRROLATE 0.2 MG/ML IJ SOLN
INTRAMUSCULAR | Status: AC
  Filled 2021-09-28: qty 1

## 2021-09-28 MED ORDER — LIDOCAINE HCL (CARDIAC) PF 100 MG/5ML IV SOSY
PREFILLED_SYRINGE | INTRAVENOUS | Status: DC | PRN
  Administered 2021-09-28: 100 mg via INTRAVENOUS

## 2021-09-28 MED ORDER — PROPOFOL 500 MG/50ML IV EMUL
INTRAVENOUS | Status: DC | PRN
  Administered 2021-09-28: 165 ug/kg/min via INTRAVENOUS

## 2021-09-28 MED ORDER — MIDAZOLAM HCL 2 MG/2ML IJ SOLN
INTRAMUSCULAR | Status: DC | PRN
  Administered 2021-09-28: 2 mg via INTRAVENOUS

## 2021-09-28 MED ORDER — EPHEDRINE 5 MG/ML INJ
INTRAVENOUS | Status: AC
Start: 2021-09-28 — End: ?
  Filled 2021-09-28: qty 5

## 2021-09-28 MED ORDER — MIDAZOLAM HCL 2 MG/2ML IJ SOLN
INTRAMUSCULAR | Status: AC
  Filled 2021-09-28: qty 2

## 2021-09-28 NOTE — Transfer of Care (Signed)
Immediate Anesthesia Transfer of Care Note ? ?Patient: Denise [name] ? ?Procedure(s) Performed: COLONOSCOPY WITH PROPOFOL ? ?Patient Location: Endoscopy Unit ? ?Anesthesia Type:General ? ?Level of Consciousness: drowsy and patient cooperative ? ?Airway & Oxygen Therapy: Patient Spontanous Breathing and Patient connected to face mask oxygen ? ?Post-op Assessment: Report given to RN and Post -op Vital signs reviewed and stable ? ?Post vital signs: Reviewed and stable ? ?Last Vitals:  ?Vitals Value Taken Time  ?BP 87/71 09/28/21 0817  ?Temp    ?Pulse 74 09/28/21 0821  ?Resp 13 09/28/21 0821  ?SpO2 100 % 09/28/21 0821  ?Vitals shown include unvalidated device data. ? ?Last Pain:  ?Vitals:  ? 09/28/21 0817  ?PainSc: 0-No pain  ?   ? ?  ? ?Complications: No notable events documented. ?

## 2021-09-28 NOTE — Anesthesia Postprocedure Evaluation (Signed)
Anesthesia Post Note ? ?Patient: Denise [name] ? ?Procedure(s) Performed: COLONOSCOPY WITH PROPOFOL ? ?Patient location during evaluation: Endoscopy ?Anesthesia Type: General ?Level of consciousness: awake and alert ?Pain management: pain level controlled ?Vital Signs Assessment: post-procedure vital signs reviewed and stable ?Respiratory status: spontaneous breathing, nonlabored ventilation, respiratory function stable and patient connected to nasal cannula oxygen ?Cardiovascular status: blood pressure returned to baseline and stable ?Postop Assessment: no apparent nausea or vomiting ?Anesthetic complications: no ? ? ?No notable events documented. ? ? ?Last Vitals:  ?Vitals:  ? 09/28/21 0709 09/28/21 0817  ?BP: (!) 126/92   ?Pulse: 92   ?Resp: 16   ?Temp: (!) 35.6 ?C (!) 35.6 ?C  ?SpO2: 98%   ?  ?Last Pain:  ?Vitals:  ? 09/28/21 0837  ?TempSrc:   ?PainSc: 0-No pain  ? ? ?  ?  ?  ?  ?  ?  ? ?Martha Clan ? ? ? ? ?

## 2021-09-28 NOTE — Anesthesia Procedure Notes (Signed)
Procedure Name: General with mask airway ?Date/Time: 09/28/2021 8:12 AM ?Performed by: Kelton Pillar, CRNA ?Pre-anesthesia Checklist: Patient identified, Emergency Drugs available, Suction available and Patient being monitored ?Patient Re-evaluated:Patient Re-evaluated prior to induction ?Oxygen Delivery Method: Simple face mask ?Induction Type: IV induction ?Placement Confirmation: positive ETCO2 and CO2 detector ?Dental Injury: Teeth and Oropharynx as per pre-operative assessment  ? ? ? ? ?

## 2021-09-28 NOTE — H&P (Signed)
Denise [name] ?[id number] ?April 26, 1976 ? ?  ? ?HPI: Healthy woman for screening colonoscopy. Tolerated the prep well.  ? ?Facility-Administered Medications Prior to Admission  ?Medication Dose Route Frequency Provider Last Rate Last Admin  ? paragard intrauterine copper IUD   Intrauterine Once Copland, Alicia B, PA-C      ? ?Medications Prior to Admission  ?Medication Sig Dispense Refill Last Dose  ? bisacodyl 5 MG EC tablet Take 5 mg by mouth daily as needed for moderate constipation.     ? CALCIUM PO Take by mouth daily.   09/27/2021  ? cholecalciferol (VITAMIN D3) 25 MCG (1000 UNIT) tablet Take 1,000 Units by mouth daily.   09/27/2021  ? loratadine (CLARITIN) 10 MG tablet Take 10 mg by mouth daily.     ? omeprazole (PRILOSEC) 20 MG capsule Take 20 mg by mouth daily as needed.   Past Month  ? tamoxifen (NOLVADEX) 20 MG tablet Take 1 tablet (20 mg total) by mouth daily. 90 tablet 1 09/27/2021  ? venlafaxine XR (EFFEXOR-XR) 37.5 MG 24 hr capsule Take 1 capsule (37.5 mg total) by mouth daily with breakfast. 90 capsule 3 09/27/2021  ? vitamin C (ASCORBIC ACID) 500 MG tablet Take 500 mg by mouth daily.   09/27/2021  ? acetaminophen (TYLENOL) 500 MG tablet Take 1,000 mg by mouth every 6 (six) hours as needed for moderate pain or headache.      ? diazepam (VALIUM) 2 MG tablet Take 2 mg by mouth every 12 (twelve) hours as needed for anxiety. (Patient not taking: Reported on 09/28/2021)   Not Taking  ? metoprolol succinate (TOPROL-XL) 25 MG 24 hr tablet Take 25 mg by mouth daily. (Patient not taking: Reported on 09/28/2021)   Not Taking  ? ?Allergies  ?Allergen Reactions  ? Steri-Strip Compound Benzoin [Benzoin Compound]   ?  Steri-strip ,   ? Meloxicam Rash  ? Silicone Rash  ?  Minor skin irritation ?Minor skin irritation ? ?Minor skin irritation  ? Tape Other (See Comments)  ?  Minor skin irritation ? ?Minor skin irritation  ? ?Past Medical History:  ?Diagnosis Date  ? Abnormal breast biopsy   ? PASH, [name]  ? Breast  cancer ([place]) 11/2017  ? left breast; ER/PR/Her2neu POS  ? Family history of breast cancer   ? Family history of ovarian cancer   ? Fibroadenoma of breast, left 2000, 2016  ? Genetic screening 08/26/2013  ? 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 Hypertension   ? Increased risk of breast cancer 2017  ? IBIS=47%  ? Left breast lump 06/18/2017  ? Fluid/drained J Jax Kentner  ? Migraine   ? Monoallelic mutation of CHEK2 gene in female patient 2017  ? increased risk of breast and colon cancer  ? Personal history of chemotherapy   ? Personal history of radiation therapy   ? ?Past Surgical History:  ?Procedure Laterality Date  ? BREAST BIOPSY Bilateral 09/08/14  ? neg  ? BREAST BIOPSY Left 03/16/2015  ? Procedure: BREAST BIOPSY WITH NEEDLE LOCALIZATION;  Surgeon: [name], MD;  Location: [facility] ORS;  Service: General;  Laterality: Left;  ? BREAST BIOPSY Left 11/2017  ? positive  ? BREAST CYST ASPIRATION Left 06/18/2017  ? cyst aspiration  ? BREAST EXCISIONAL BIOPSY Left 09/2014  ? BREAST EXCISIONAL BIOPSY Right   ? age 75's  ? BREAST RECONSTRUCTION WITH PLACEMENT OF TISSUE EXPANDER AND FLEX HD (ACELLULAR HYDRATED DERMIS) Left 05/20/2018  ? Procedure: BREAST RECONSTRUCTION WITH PLACEMENT OF  Denise [name] ?[id number] ?April 26, 1976 ? ?  ? ?HPI: Healthy woman for screening colonoscopy. Tolerated the prep well.  ? ?Facility-Administered Medications Prior to Admission  ?Medication Dose Route Frequency Provider Last Rate Last Admin  ? paragard intrauterine copper IUD   Intrauterine Once Copland, Alicia B, PA-C      ? ?Medications Prior to Admission  ?Medication Sig Dispense Refill Last Dose  ? bisacodyl 5 MG EC tablet Take 5 mg by mouth daily as needed for moderate constipation.     ? CALCIUM PO Take by mouth daily.   09/27/2021  ? cholecalciferol (VITAMIN D3) 25 MCG (1000 UNIT) tablet Take 1,000 Units by mouth daily.   09/27/2021  ? loratadine (CLARITIN) 10 MG tablet Take 10 mg by mouth daily.     ? omeprazole (PRILOSEC) 20 MG capsule Take 20 mg by mouth daily as needed.   Past Month  ? tamoxifen (NOLVADEX) 20 MG tablet Take 1 tablet (20 mg total) by mouth daily. 90 tablet 1 09/27/2021  ? venlafaxine XR (EFFEXOR-XR) 37.5 MG 24 hr capsule Take 1 capsule (37.5 mg total) by mouth daily with breakfast. 90 capsule 3 09/27/2021  ? vitamin C (ASCORBIC ACID) 500 MG tablet Take 500 mg by mouth daily.   09/27/2021  ? acetaminophen (TYLENOL) 500 MG tablet Take 1,000 mg by mouth every 6 (six) hours as needed for moderate pain or headache.      ? diazepam (VALIUM) 2 MG tablet Take 2 mg by mouth every 12 (twelve) hours as needed for anxiety. (Patient not taking: Reported on 09/28/2021)   Not Taking  ? metoprolol succinate (TOPROL-XL) 25 MG 24 hr tablet Take 25 mg by mouth daily. (Patient not taking: Reported on 09/28/2021)   Not Taking  ? ?Allergies  ?Allergen Reactions  ? Steri-Strip Compound Benzoin [Benzoin Compound]   ?  Steri-strip ,   ? Meloxicam Rash  ? Silicone Rash  ?  Minor skin irritation ?Minor skin irritation ? ?Minor skin irritation  ? Tape Other (See Comments)  ?  Minor skin irritation ? ?Minor skin irritation  ? ?Past Medical History:  ?Diagnosis Date  ? Abnormal breast biopsy   ? PASH, [name]  ? Breast  cancer ([place]) 11/2017  ? left breast; ER/PR/Her2neu POS  ? Family history of breast cancer   ? Family history of ovarian cancer   ? Fibroadenoma of breast, left 2000, 2016  ? Genetic screening 08/26/2013  ? 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 Hypertension   ? Increased risk of breast cancer 2017  ? IBIS=47%  ? Left breast lump 06/18/2017  ? Fluid/drained J Jax Kentner  ? Migraine   ? Monoallelic mutation of CHEK2 gene in female patient 2017  ? increased risk of breast and colon cancer  ? Personal history of chemotherapy   ? Personal history of radiation therapy   ? ?Past Surgical History:  ?Procedure Laterality Date  ? BREAST BIOPSY Bilateral 09/08/14  ? neg  ? BREAST BIOPSY Left 03/16/2015  ? Procedure: BREAST BIOPSY WITH NEEDLE LOCALIZATION;  Surgeon: [name], MD;  Location: [facility] ORS;  Service: General;  Laterality: Left;  ? BREAST BIOPSY Left 11/2017  ? positive  ? BREAST CYST ASPIRATION Left 06/18/2017  ? cyst aspiration  ? BREAST EXCISIONAL BIOPSY Left 09/2014  ? BREAST EXCISIONAL BIOPSY Right   ? age 75's  ? BREAST RECONSTRUCTION WITH PLACEMENT OF TISSUE EXPANDER AND FLEX HD (ACELLULAR HYDRATED DERMIS) Left 05/20/2018  ? Procedure: BREAST RECONSTRUCTION WITH PLACEMENT OF

## 2021-09-28 NOTE — Anesthesia Preprocedure Evaluation (Signed)
Anesthesia Evaluation  ?Patient identified by MRN, date of birth, ID band ?Patient awake ? ? ? ?Reviewed: ?Allergy & Precautions, H&P , NPO status , Patient's Chart, lab work & pertinent test results ? ?History of Anesthesia Complications ?Negative for: history of anesthetic complications ? ?Airway ?Mallampati: II ? ?TM Distance: >3 FB ?Neck ROM: full ? ? ? Dental ?no notable dental hx. ?(+) Teeth Intact ?  ?Pulmonary ?neg pulmonary ROS, neg shortness of breath,  ?  ?Pulmonary exam normal ?breath sounds clear to auscultation ? ? ? ? ? ? Cardiovascular ?Exercise Tolerance: Good ?hypertension, (-) Past MI negative cardio ROS ?Normal cardiovascular exam ?Rhythm:regular Rate:Normal ? ? ?  ?Neuro/Psych ? Headaches, negative psych ROS  ? GI/Hepatic ?negative GI ROS, Neg liver ROS, neg GERD  ,  ?Endo/Other  ?negative endocrine ROS ? Renal/GU ?negative Renal ROS  ?negative genitourinary ?  ?Musculoskeletal ? ? Abdominal ?  ?Peds ? Hematology ?negative hematology ROS ?(+)   ?Anesthesia Other Findings ?History of breast cancer. ? ?Past Medical History: ?No date: Abnormal breast biopsy ?    Comment:  PASH, [name] ?No date: Family history of breast cancer ?No date: Family history of ovarian cancer ?2000, 2016: Fibroadenoma of breast, left ?08/26/2013: Genetic screening ?    Comment:  BRCA/BART negative/Myriad, CHEK2 POS 2017 ?2017: Increased risk of breast cancer ?    Comment:  IBIS=47% ?06/18/2017: Left breast lump ?    Comment:  Fluid/drained [name] ?No date: Migraine ?7096: Monoallelic mutation of CHEK2 gene in female patient ?    Comment:  increased risk of breast and colon cancer ? ? Reproductive/Obstetrics ?negative OB ROS ? ?  ? ? ? ? ? ? ? ? ? ? ? ? ? ?  ?  ? ? ? ? ? ? ? ? ?Anesthesia Physical ? ?Anesthesia Plan ? ?ASA: 2 ? ?Anesthesia Plan: General  ? ?Post-op Pain Management:   ? ?Induction: Intravenous ? ?PONV Risk Score and Plan: Propofol infusion and TIVA ? ?Airway  Management Planned: Natural Airway and Nasal Cannula ? ?Additional Equipment:  ? ?Intra-op Plan:  ? ?Post-operative Plan:  ? ?Informed Consent: I have reviewed the patients History and Physical, chart, labs and discussed the procedure including the risks, benefits and alternatives for the proposed anesthesia with the patient or authorized representative who has indicated his/her understanding and acceptance.  ? ? ? ?Dental Advisory Given ? ?Plan Discussed with: Anesthesiologist, CRNA and Surgeon ? ?Anesthesia Plan Comments:   ? ? ? ? ? ? ?Anesthesia Quick Evaluation ? ?

## 2021-09-28 NOTE — Op Note (Addendum)
[facility] ?Gastroenterology ?Patient Name: Denise [name] ?Procedure Date: 09/28/2021 7:05 AM ?MRN: [id number] ?Account #: [id number] ?Date of Birth: 1975-08-22 ?Admit Type: Inpatient ?Age: 46 ?Room: [facility] ENDO ROOM 1 ?Gender: Female ?Note Status: Supervisor Override ?Instrument Name: Peds Colonoscope [id number] ?Procedure:             Colonoscopy ?Indications:           Screening for colorectal malignant neoplasm ?Providers:             [name], MD ?Complications:         No immediate complications. ?Procedure:             Pre-Anesthesia Assessment: ?                       - Prior to the procedure, a History and Physical was  ?                       performed, and patient medications, allergies and  ?                       sensitivities were reviewed. The patient's tolerance  ?                       of previous anesthesia was reviewed. ?                       - The risks and benefits of the procedure and the  ?                       sedation options and risks were discussed with the  ?                       patient. All questions were answered and informed  ?                       consent was obtained. ?                       After obtaining informed consent, the colonoscope was  ?                       passed under direct vision. Throughout the procedure,  ?                       the patient's blood pressure, pulse, and oxygen  ?                       saturations were monitored continuously. The  ?                       Colonoscope was introduced through the anus and  ?                       advanced to the the cecum, identified by appendiceal  ?                       orifice and ileocecal valve. The colonoscopy was  ?  performed without difficulty. The patient tolerated  ?                       the procedure well. The quality of the bowel  ?                       preparation was excellent. ?Findings: ?     The entire examined colon appeared normal on direct and  retroflexion  ?     views. ?Impression:            - The entire examined colon is normal on direct and  ?                       retroflexion views. ?                       - No specimens collected. ?Recommendation:        - Repeat colonoscopy in 10 years for screening  ?                       purposes. ?Procedure Code(s):     --- Professional --- ?                       [phone], Colonoscopy, flexible; diagnostic, including  ?                       collection of specimen(s) by brushing or washing, when  ?                       performed (separate procedure) ?CPT copyright 2019 [employer]. All rights reserved. ?The codes documented in this report are preliminary and upon coder review may  ?be revised to meet current compliance requirements. ?[name], MD ?09/28/2021 8:15:08 AM ?This report has been signed electronically. ?Number of Addenda: 0 ?Note Initiated On: 09/28/2021 7:05 AM ?Scope Withdrawal Time: 0 hours 8 minutes 12 seconds  ?Total Procedure Duration: 0 hours 16 minutes 25 seconds  ?Estimated Blood Loss:  Estimated blood loss: none. ?     [facility] ?

## 2021-09-29 ENCOUNTER — Encounter: Payer: Self-pay | Admitting: General Surgery

## 2021-10-07 ENCOUNTER — Encounter: Payer: BC Managed Care – PPO | Admitting: Physical Therapy

## 2021-10-12 ENCOUNTER — Encounter: Payer: BC Managed Care – PPO | Admitting: Physical Therapy

## 2021-10-19 ENCOUNTER — Ambulatory Visit: Payer: BC Managed Care – PPO | Attending: Surgical | Admitting: Physical Therapy

## 2021-10-19 ENCOUNTER — Encounter: Payer: Self-pay | Admitting: Physical Therapy

## 2021-10-19 DIAGNOSIS — M79622 Pain in left upper arm: Secondary | ICD-10-CM | POA: Insufficient documentation

## 2021-10-19 DIAGNOSIS — Z9889 Other specified postprocedural states: Secondary | ICD-10-CM | POA: Insufficient documentation

## 2021-10-19 NOTE — Therapy (Signed)
[facility] ?[address] ?[place], [place], [address] ?Phone: [phone]   Fax:  [phone] ? ?Physical Therapy Evaluation ? ?Patient Details  ?Name: Denise [name] ?MRN: [id number] ?Date of Birth: [birth year] ?No data recorded ? ?Encounter Date: 10/19/2021 ? ? PT End of Session - 10/19/21 1833   ? ? Visit Number 1   ? Number of Visits 13   ? Date for PT Re-Evaluation 11/30/21   ? PT Start Time [phone]   ? PT Stop Time 7026   ? PT Time Calculation (min) 43 min   ? Activity Tolerance Patient tolerated treatment well   ? Behavior During Therapy [facility] for tasks assessed/performed   ? ?  ?  ? ?  ? ? ?Past Medical History:  ?Diagnosis Date  ? Abnormal breast biopsy   ? PASH, [name]  ? Breast cancer ([place]) 11/2017  ? left breast; ER/PR/Her2neu POS  ? Family history of breast cancer   ? Family history of ovarian cancer   ? Fibroadenoma of breast, left 2000, 2016  ? Genetic screening 08/26/2013  ? 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 Hypertension   ? Increased risk of breast cancer 2017  ? IBIS=47%  ? Left breast lump 06/18/2017  ? Fluid/drained [name]  ? Migraine   ? Monoallelic mutation of CHEK2 gene in female patient 2017  ? increased risk of breast and colon cancer  ? Personal history of chemotherapy   ? Personal history of radiation therapy   ? ? ?Past Surgical History:  ?Procedure Laterality Date  ? BREAST BIOPSY Bilateral 09/08/14  ? neg  ? BREAST BIOPSY Left 03/16/2015  ? Procedure: BREAST BIOPSY WITH NEEDLE LOCALIZATION;  Surgeon: [name], MD;  Location: [facility] ORS;  Service: General;  Laterality: Left;  ? BREAST BIOPSY Left 11/2017  ? positive  ? BREAST CYST ASPIRATION Left 06/18/2017  ? cyst aspiration  ? BREAST EXCISIONAL BIOPSY Left 09/2014  ? BREAST EXCISIONAL BIOPSY Right   ? age 10's  ? BREAST RECONSTRUCTION WITH PLACEMENT OF TISSUE EXPANDER AND FLEX HD (ACELLULAR HYDRATED DERMIS) Left 05/20/2018  ? Procedure: BREAST  RECONSTRUCTION WITH PLACEMENT OF TISSUE EXPANDER AND FLEX HD (ACELLULAR HYDRATED DERMIS);  Surgeon: [name], DO;  Location: [facility] ORS;  Service: Plastics;  Laterality: Left;  ? BREAST RECONSTRUCTION WITH PLACEMENT OF TISSUE EXPANDER AND FLEX HD (ACELLULAR HYDRATED DERMIS) Right 12/09/2018  ? Procedure: BREAST RECONSTRUCTION WITH PLACEMENT OF TISSUE EXPANDER AND FLEX HD (ACELLULAR HYDRATED DERMIS);  Surgeon: [name], DO;  Location: [facility] ORS;  Service: Plastics;  Laterality: Right;  ? BREAST SURGERY Right March 2000  ? benign fibroadenoma  ? BREAST SURGERY Left 08/08/13  ? excision  ? BREAST SURGERY Left 09/23/14  ? excision  ? COLONOSCOPY WITH PROPOFOL N/A 09/28/2021  ? Procedure: COLONOSCOPY WITH PROPOFOL;  Surgeon: [name], MD;  Location[facility];  Service: Endoscopy;  Laterality: N/A;  ? ESOPHAGOGASTRODUODENOSCOPY (EGD) WITH PROPOFOL N/A 01/20/2019  ? Procedure: ESOPHAGOGASTRODUODENOSCOPY (EGD) WITH PROPOFOL;  Surgeon: [name], MD;  Location: [facility] ENDOSCOPY;  Service: Gastroenterology;  Laterality: N/A;  ? MASTECTOMY Left 05/20/2018  ? MASTECTOMY W/ SENTINEL NODE BIOPSY Left 05/20/2018  ? Procedure: MASTECTOMY WITH SENTINEL LYMPH NODE BIOPSY;  Surgeon: [name], MD;  Location: [facility] ORS;  Service: General;  Laterality: Left;  ? PORT-A-CATH REMOVAL Right 03/31/2019  ? Procedure: REMOVAL PORT-A-CATH;  Surgeon: [name], DO;  Location: [facility] ORS;  Service: Clinical cytogeneticist;  Laterality: Right;  ? PORTACATH PLACEMENT Right 12/17/2017  ? Procedure: INSERTION PORT-A-CATH;  Surgeon: [name], MD;  Location: [facility] ORS;  Service: General;  Laterality: Right;  ? REMOVAL OF BILATERAL TISSUE EXPANDERS WITH PLACEMENT OF BILATERAL BREAST IMPLANTS Bilateral 03/31/2019  ? Procedure: REMOVAL OF BILATERAL TISSUE EXPANDERS WITH PLACEMENT OF BILATERAL BREAST IMPLANTS;  Surgeon: [name], DO;  Location: [facility] ORS;  Service: Plastics;  Laterality: Bilateral;   2 hours  ? SIMPLE MASTECTOMY WITH AXILLARY SENTINEL NODE BIOPSY Right 12/09/2018  ? Procedure: SIMPLE MASTECTOMY RIGHT;  Surgeon: [name], MD;  Location: [facility] ORS;  Service: General;  Laterality: Right;  ? ? ?There were no vitals filed for this visit. ? ? ? ? ?SUBJECTIVE ?Chief complaint: Patient arrives with report of tightness/tenderness in left chest/axillary region s/p breast reconstruction in October 2020 with implants placed after bilateral mastectomy and left radiation. She also reports an occasional "pinching" that began following radiation however has become more frequent as of lately. The pain associated is sharp and brief; pt unable to correlate to specific movements as it happens at rest and with a variety of activities. It does not affect her ability to sleep at night. Pt states her plastic surgeon believes the tightness may be causing her left breast to be pulled laterally.  ? ?Onset: 3 years ago but pinch is becoming more frequent.  ?Shoulder trauma: No  ?Pain quality: sharp and brief  ?Pain: 0/10 Present, 0/10 Best, 7/10 Worst: ?Aggravating factors: occurs at rest, driving ?Easing factors: is resolves very quickly ?24 hour pain behavior: no pain  ?Radiating pain: No ?Numbness/Tingling: No ?Follow-up appointment with MD: Yes - with plastic surgeon in July ?Dominant hand: Right ? ?OBJECTIVE ? ?MUSCULOSKELETAL: ?Tremor: Normal ?Bulk: Normal ?Tone: Normal ? ?Cervical Screen ?AROM: WFL and painless with overpressure in all planes ?Spurlings A: negative bilaterally  ?Elbow Screen ?Elbow AROM: Within Normal Limits ? ?Palpation ?Tight/tender in pec major, pec minor, subscap, teres major and serratus anterior.  ? ?Muscle Length ?Pec major - decreased ?Pec minor - WFL ? ?Strength ?WFL ? ?AROM ?WFL ? ? ?NEUROLOGICAL: ? ?Sensation ?Grossly intact to light touch bilateral UE as determined by testing dermatomes C2-T2. ? ?FOTO: 56 ? ? ?INTERVENTION: ?STM utilizing effleurage, p?trissage and sustained  pressure trigger point release to pec major, pec minor, subscap, teres major and serratus anterior, LUE for 15 minutes.  ? ?Introduced HEP including: pec stretch (doorway), W hold at wall, supine chest stretch on foam roll ?Pt performed 2 sets of each, holding 30-60 seconds.  ? ? ?ASSESSMENT ?Pt is a pleasant 46 year-old female referred for tightness of left pectoralis and axillary region following radiation. Pt demonstrates full ROM and strength in BUE. PT examination reveals decreased length of pectoralis major and palpable tenderness/trigger points in pec major, pec minor, subscap, teres major and serratus anterior. Pt will benefit from PT services to address deficits in muscle length and muscle tension through left shoulder/axillary region in order to return to pain-free function at home and work. ? ? ?PLAN ?HEP: pec stretch (doorway), W hold at wall, supine chest stretch on foam roll  ?Next Visit: manual techniques to release pec major, pec minor, subscap, teres major and serratus anterior. ? ? ? ? ? ?Objective measurements completed on examination: See above findings.  ? ? ? ? ? ? ? PT Education - 10/19/21 1832   ? ? Education Details POC, HEP   ? Person(s) Educated Patient   ? Methods  Explanation;Handout;Demonstration   ? Comprehension Returned demonstration;Verbalized understanding   ? ?  ?  ? ?  ? ? ? PT Short Term Goals - 10/19/21 1837   ? ?  ? PT SHORT TERM GOAL #1  ? Title Pt will be independent with HEP in order to improve strength and motion, and to decrease pain in order to improve pain-free function at home and work.   ? Time 2   ? Period Weeks   ? Status New   ? Target Date 11/02/21   ? ?  ?  ? ?  ? ? ? ? PT Long Term Goals - 10/19/21 1837   ? ?  ? PT LONG TERM GOAL #1  ? Title Pt will decrease worst pain as reported on NPRS by at least 3 points in order to demonstrate clinically significant reduction in pain.   ? Baseline 10/19/21: 7/10   ? Time 6   ? Period Weeks   ? Status New   ? Target Date  11/02/21   ?  ? PT LONG TERM GOAL #2  ? Title Patient will increase FOTO score to equal to or greater than 74 to demonstrate statistically significant improvement in mobility and quality of life.   ? Baseline 10/19/21: 54

## 2021-10-21 ENCOUNTER — Ambulatory Visit: Payer: BC Managed Care – PPO

## 2021-10-21 DIAGNOSIS — M79622 Pain in left upper arm: Secondary | ICD-10-CM | POA: Diagnosis not present

## 2021-10-21 NOTE — Therapy (Signed)
[facility] ?[address] ?[place], [place], [address] ?Phone: [phone]   Fax:  [phone] ? ?Physical Therapy Treatment ? ?Patient Details  ?Name: Denise [name] ?MRN: [id number] ?Date of Birth: 02-04-1976 ?No data recorded ? ?Encounter Date: 10/21/2021 ? ? PT End of Session - 10/21/21 1110   ? ? Visit Number 2   ? Number of Visits 13   ? Date for PT Re-Evaluation 11/30/21   ? Authorization Type [employer] Employee- 30VL per calendar year   ? Authorization Time Period 10/19/21-11/30/21   ? Authorization - Visit Number 2   ? Authorization - Number of Visits 30   ? Progress Note Due on Visit 10   ? PT Start Time [phone]   ? PT Stop Time 0370   ? PT Time Calculation (min) 43 min   ? Activity Tolerance Patient tolerated treatment well;No increased pain   ? Behavior During Therapy [facility] for tasks assessed/performed   ? ?  ?  ? ?  ? ? ?Past Medical History:  ?Diagnosis Date  ? Abnormal breast biopsy   ? PASH, [name]  ? Breast cancer ([place]) 11/2017  ? left breast; ER/PR/Her2neu POS  ? Family history of breast cancer   ? Family history of ovarian cancer   ? Fibroadenoma of breast, left 2000, 2016  ? Genetic screening 08/26/2013  ? 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 Hypertension   ? Increased risk of breast cancer 2017  ? IBIS=47%  ? Left breast lump 06/18/2017  ? Fluid/drained [name]  ? Migraine   ? Monoallelic mutation of CHEK2 gene in female patient 2017  ? increased risk of breast and colon cancer  ? Personal history of chemotherapy   ? Personal history of radiation therapy   ? ? ?Past Surgical History:  ?Procedure Laterality Date  ? BREAST BIOPSY Bilateral 09/08/14  ? neg  ? BREAST BIOPSY Left 03/16/2015  ? Procedure: BREAST BIOPSY WITH NEEDLE LOCALIZATION;  Surgeon: [name], MD;  Location: [facility] ORS;  Service: General;  Laterality: Left;  ? BREAST BIOPSY Left 11/2017  ? positive  ? BREAST CYST ASPIRATION Left 06/18/2017  ? cyst  aspiration  ? BREAST EXCISIONAL BIOPSY Left 09/2014  ? BREAST EXCISIONAL BIOPSY Right   ? age 15's  ? BREAST RECONSTRUCTION WITH PLACEMENT OF TISSUE EXPANDER AND FLEX HD (ACELLULAR HYDRATED DERMIS) Left 05/20/2018  ? Procedure: BREAST RECONSTRUCTION WITH PLACEMENT OF TISSUE EXPANDER AND FLEX HD (ACELLULAR HYDRATED DERMIS);  Surgeon: [name], DO;  Location: [facility] ORS;  Service: Plastics;  Laterality: Left;  ? BREAST RECONSTRUCTION WITH PLACEMENT OF TISSUE EXPANDER AND FLEX HD (ACELLULAR HYDRATED DERMIS) Right 12/09/2018  ? Procedure: BREAST RECONSTRUCTION WITH PLACEMENT OF TISSUE EXPANDER AND FLEX HD (ACELLULAR HYDRATED DERMIS);  Surgeon: [name], DO;  Location: [facility];  Service: Plastics;  Laterality: Right;  ? BREAST SURGERY Right March 2000  ? benign fibroadenoma  ? BREAST SURGERY Left 08/08/13  ? excision  ? BREAST SURGERY Left 09/23/14  ? excision  ? COLONOSCOPY WITH PROPOFOL N/A 09/28/2021  ? Procedure: COLONOSCOPY WITH PROPOFOL;  Surgeon: [name], MD;  Location: [facility] ENDOSCOPY;  Service: Endoscopy;  Laterality: N/A;  ? ESOPHAGOGASTRODUODENOSCOPY (EGD) WITH PROPOFOL N/A 01/20/2019  ? Procedure: ESOPHAGOGASTRODUODENOSCOPY (EGD) WITH PROPOFOL;  Surgeon: [name], MD;  Location: [facility] ENDOSCOPY;  Service: Gastroenterology;  Laterality: N/A;  ? MASTECTOMY Left 05/20/2018  ? MASTECTOMY W/ SENTINEL NODE BIOPSY Left 05/20/2018  ?  Procedure: MASTECTOMY WITH SENTINEL LYMPH NODE BIOPSY;  Surgeon: [name], MD;  Location: [facility];  Service: General;  Laterality: Left;  ? PORT-A-CATH REMOVAL Right 03/31/2019  ? Procedure: REMOVAL PORT-A-CATH;  Surgeon: [name], DO;  Location: [facility];  Service: Plastics;  Laterality: Right;  ? PORTACATH PLACEMENT Right 12/17/2017  ? Procedure: INSERTION PORT-A-CATH;  Surgeon: [name], MD;  Location: [facility] ORS;  Service: General;  Laterality: Right;  ? REMOVAL OF BILATERAL TISSUE EXPANDERS WITH PLACEMENT OF BILATERAL  BREAST IMPLANTS Bilateral 03/31/2019  ? Procedure: REMOVAL OF BILATERAL TISSUE EXPANDERS WITH PLACEMENT OF BILATERAL BREAST IMPLANTS;  Surgeon: [name], DO;  Location: [facility] ORS;  Service: Plastics;  Laterality: Bilateral;  2 hours  ? SIMPLE MASTECTOMY WITH AXILLARY SENTINEL NODE BIOPSY Right 12/09/2018  ? Procedure: SIMPLE MASTECTOMY RIGHT;  Surgeon: [name], MD;  Location: [facility] ORS;  Service: General;  Laterality: Right;  ? ? ?There were no vitals filed for this visit. ? ? Subjective Assessment - 10/21/21 0838   ? ? Subjective Pt here for visit 2. Reports understanding and compliance with HEP. Pain remains intermittent, brief, and unpredictable.   ? Pertinent History Denise [name] is a 46yoF referred to OPPT by plastic surgery s/p progressive tightness/tenderness in left chest/axillary region s/p breast reconstruction in October 2020 with implants placed after bilateral mastectomy and left radiation. She also reports an occasional "pinching" that began following radiation however has become more frequent as of lately. The pain associated is sharp and brief; pt unable to correlate to specific movements as it happens at rest and with a variety of activities. It does not affect her ability to sleep at night. Pt states her plastic surgeon believes the tightness may be causing her left breast to be pulled laterally.   ? Currently in Pain? No/denies   ? ?  ?  ? ?  ? ? ?Tests and Measures: ?-15 rep max unilateral chest press (15lb free weight) <10* assymetry)  ? ?Shoulder ROM Assessment 10/21/21   ? Right  Left   ?Flexion A/ROM 165 165  ?ABDCT A/ROM  175 162  ?External rotation 85 80  ?Internal Rotation  55 60  ? ?-circumferential assessment of wrists, proximal forearm: equal bilat (history of Rt sided fluid retention)   ? ? ?INTERVENTION: ?-ART to pec major  ?-MFR to Left intercostals ribs 6,7,8 lateral ?-deep palpation of subscapularis, pec minor, serratus anterior, all tender but unrelated to CC   ? ?-Left chest press dumbbell 15lb 2x12  ? ? ? ? PT Education - 10/21/21 1113   ? ? Education Details discussed ROM assessment   ? Person(s) Educated Patient   ? Methods Explanation;Demonstration   ? Comprehension Verbalized understanding   ? ?  ?  ? ?  ? ? ? PT Short Term Goals - 10/19/21 1837   ? ?  ? PT SHORT TERM GOAL #1  ? Title Pt will be independent with HEP in order to improve strength and motion, and to decrease pain in order to improve pain-free function at home and work.   ? Time 2   ? Period Weeks   ? Status New   ? Target Date 11/02/21   ? ?  ?  ? ?  ? ? ? ? PT Long Term Goals - 10/19/21 1837   ? ?  ? PT LONG TERM GOAL #1  ? Title Pt will decrease worst pain as reported on NPRS by at least 3 points in  order to demonstrate clinically significant reduction in pain.   ? Baseline 10/19/21: 7/10   ? Time 6   ? Period Weeks   ? Status New   ? Target Date 11/02/21   ?  ? PT LONG TERM GOAL #2  ? Title Patient will increase FOTO score to equal to or greater than 74 to demonstrate statistically significant improvement in mobility and quality of life.   ? Baseline 10/19/21: 54   ? Time 6   ? Period Weeks   ? Status New   ? Target Date 11/30/21   ?  ? PT LONG TERM GOAL #3  ? Title Pt will report complete cessation of "pinching" pains in order to function pain-free at home, work and community without momentary pause due to brief yet severe pain.   ? Baseline 10/19/21: has become more frequent over the couple months   ? Time 6   ? Period Weeks   ? Status New   ? Target Date 11/30/21   ? ?  ?  ? ?  ? ? ? ? ? ? ? ? Plan - 10/21/21 1113   ? ? Clinical Impression Statement Assessment of shoulder ROM taken. Soft tissue work targeted at [employer] major and intercostals ribs 6/7/8 ~20 degrees anterior from frontal plane bisection. Pt gives verbal feedback throughout session, reports some release of intercostals in session. No HEP updates this date.   ? Personal Factors and Comorbidities Age;Past/Current Experience;Comorbidity 1   ?  Comorbidities History of CA   ? Examination-Participation Restrictions Community Activity   ? Stability/Clinical Decision Making Stable/Uncomplicated   ? Clinical Decision Making Low   ? Rehab Potential Good   ? P

## 2021-10-24 ENCOUNTER — Encounter: Payer: Self-pay | Admitting: Physical Therapy

## 2021-10-24 ENCOUNTER — Ambulatory Visit: Payer: BC Managed Care – PPO | Admitting: Physical Therapy

## 2021-10-24 DIAGNOSIS — M79622 Pain in left upper arm: Secondary | ICD-10-CM | POA: Diagnosis not present

## 2021-10-24 NOTE — Therapy (Signed)
[place] [facility] ?[address] ?[place], [place], [address] ?Phone: [phone]   Fax:  [phone] ? ?Physical Therapy Treatment ? ?Patient Details  ?Name: Denise [name] ?MRN: [id number] ?Date of Birth: Jun 04, 1976 ?No data recorded ? ?Encounter Date: 10/24/2021 ? ? PT End of Session - 10/24/21 6759   ? ? Visit Number 3   ? Number of Visits 13   ? Date for PT Re-Evaluation 11/30/21   ? Authorization Type [employer] Employee- 30VL per calendar year   ? Authorization Time Period 10/19/21-11/30/21   ? Authorization - Number of Visits 30   ? Progress Note Due on Visit 10   ? PT Start Time [phone]   ? PT Stop Time 0829   ? PT Time Calculation (min) 40 min   ? Activity Tolerance Patient tolerated treatment well;No increased pain   ? Behavior During Therapy [facility] for tasks assessed/performed   ? ?  ?  ? ?  ? ? ?Past Medical History:  ?Diagnosis Date  ? Abnormal breast biopsy   ? PASH, [name]  ? Breast cancer ([place]) 11/2017  ? left breast; ER/PR/Her2neu POS  ? Family history of breast cancer   ? Family history of ovarian cancer   ? Fibroadenoma of breast, left 2000, 2016  ? Genetic screening 08/26/2013  ? 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 Hypertension   ? Increased risk of breast cancer 2017  ? IBIS=47%  ? Left breast lump 06/18/2017  ? Fluid/drained [name]  ? Migraine   ? Monoallelic mutation of CHEK2 gene in female patient 2017  ? increased risk of breast and colon cancer  ? Personal history of chemotherapy   ? Personal history of radiation therapy   ? ? ?Past Surgical History:  ?Procedure Laterality Date  ? BREAST BIOPSY Bilateral 09/08/14  ? neg  ? BREAST BIOPSY Left 03/16/2015  ? Procedure: BREAST BIOPSY WITH NEEDLE LOCALIZATION;  Surgeon: [name], MD;  Location: [facility];  Service: General;  Laterality: Left;  ? BREAST BIOPSY Left 11/2017  ? positive  ? BREAST CYST ASPIRATION Left 06/18/2017  ? cyst aspiration  ? BREAST EXCISIONAL BIOPSY Left  09/2014  ? BREAST EXCISIONAL BIOPSY Right   ? age 7's  ? BREAST RECONSTRUCTION WITH PLACEMENT OF TISSUE EXPANDER AND FLEX HD (ACELLULAR HYDRATED DERMIS) Left 05/20/2018  ? Procedure: BREAST RECONSTRUCTION WITH PLACEMENT OF TISSUE EXPANDER AND FLEX HD (ACELLULAR HYDRATED DERMIS);  Surgeon: [name], DO;  Location: [facility];  Service: Plastics;  Laterality: Left;  ? BREAST RECONSTRUCTION WITH PLACEMENT OF TISSUE EXPANDER AND FLEX HD (ACELLULAR HYDRATED DERMIS) Right 12/09/2018  ? Procedure: BREAST RECONSTRUCTION WITH PLACEMENT OF TISSUE EXPANDER AND FLEX HD (ACELLULAR HYDRATED DERMIS);  Surgeon: [name], DO;  Location: [facility];  Service: Plastics;  Laterality: Right;  ? BREAST SURGERY Right March 2000  ? benign fibroadenoma  ? BREAST SURGERY Left 08/08/13  ? excision  ? BREAST SURGERY Left 09/23/14  ? excision  ? COLONOSCOPY WITH PROPOFOL N/A 09/28/2021  ? Procedure: COLONOSCOPY WITH PROPOFOL;  Surgeon: [name], MD;  Location[facility] ENDOSCOPY;  Service: Endoscopy;  Laterality: N/A;  ? ESOPHAGOGASTRODUODENOSCOPY (EGD) WITH PROPOFOL N/A 01/20/2019  ? Procedure: ESOPHAGOGASTRODUODENOSCOPY (EGD) WITH PROPOFOL;  Surgeon: [name], MD;  Location: [facility] ENDOSCOPY;  Service: Gastroenterology;  Laterality: N/A;  ? MASTECTOMY Left 05/20/2018  ? MASTECTOMY W/ SENTINEL NODE BIOPSY Left 05/20/2018  ? Procedure: MASTECTOMY WITH SENTINEL LYMPH NODE  BIOPSY;  Surgeon: [name], MD;  Location: [facility] ORS;  Service: General;  Laterality: Left;  ? PORT-A-CATH REMOVAL Right 03/31/2019  ? Procedure: REMOVAL PORT-A-CATH;  Surgeon: [name], DO;  Location: [facility];  Service: Plastics;  Laterality: Right;  ? PORTACATH PLACEMENT Right 12/17/2017  ? Procedure: INSERTION PORT-A-CATH;  Surgeon: [name], MD;  Location: [facility] ORS;  Service: General;  Laterality: Right;  ? REMOVAL OF BILATERAL TISSUE EXPANDERS WITH PLACEMENT OF BILATERAL BREAST IMPLANTS Bilateral 03/31/2019  ?  Procedure: REMOVAL OF BILATERAL TISSUE EXPANDERS WITH PLACEMENT OF BILATERAL BREAST IMPLANTS;  Surgeon: [name], DO;  Location: [facility] ORS;  Service: Plastics;  Laterality: Bilateral;  2 hours  ? SIMPLE MASTECTOMY WITH AXILLARY SENTINEL NODE BIOPSY Right 12/09/2018  ? Procedure: SIMPLE MASTECTOMY RIGHT;  Surgeon: [name], MD;  Location: [facility] ORS;  Service: General;  Laterality: Right;  ? ? ?There were no vitals filed for this visit. ? ? Subjective Assessment - 10/24/21 0836   ? ? Subjective Pt states she is doing well today. Has bruising from soft tissue work of previous session; she remains tender.   ? Pertinent History Cornelius Schuitema is a 46yoF referred to OPPT by plastic surgery s/p progressive tightness/tenderness in left chest/axillary region s/p breast reconstruction in October 2020 with implants placed after bilateral mastectomy and left radiation. She also reports an occasional "pinching" that began following radiation however has become more frequent as of lately. The pain associated is sharp and brief; pt unable to correlate to specific movements as it happens at rest and with a variety of activities. It does not affect her ability to sleep at night. Pt states her plastic surgeon believes the tightness may be causing her left breast to be pulled laterally.   ? Currently in Pain? No/denies   ? ?  ?  ? ?  ? ? ? ? ?INTERVENTION: ?-STM to left pec major and left lateral intercostals ribs 6, 7 and 8 ? ?- Seated pec stretch 2x30 seconds  ? ?- Left chest press dumbbell 15lb, very heavy - reps decreasing each set 15>10>8 ?- wall push ups w/ scap protraction 2x10 ?- open book, 2x10 LUE ?- foam roller on wall for stretch 2x10 ? Pt feels into ribs  ?- abduction glide on wall LUE 2x10 ? ? ? ?Clinical Impression: Pt is pleasant and motivated within session. Moderate pain noted during release of lateral intercostals. STM followed up by active and passive stretching as well as pec strengthening. Pt  fatigued by end of session. 15# DB very heavy with mild lack of control in later sets - may decrease to 12# and work up to 15. Pt will benefit from PT services to address deficits in muscle length and muscle tension through left shoulder/axillary region in order to return to pain-free function at home and work. ? ? ? ? ? ? ? ? PT Short Term Goals - 10/19/21 1837   ? ?  ? PT SHORT TERM GOAL #1  ? Title Pt will be independent with HEP in order to improve strength and motion, and to decrease pain in order to improve pain-free function at home and work.   ? Time 2   ? Period Weeks   ? Status New   ? Target Date 11/02/21   ? ?  ?  ? ?  ? ? ? ? PT Long Term Goals - 10/19/21 1837   ? ?  ? PT LONG TERM GOAL #1  ?  Title Pt will decrease worst pain as reported on NPRS by at least 3 points in order to demonstrate clinically significant reduction in pain.   ? Baseline 10/19/21: 7/10   ? Time 6   ? Period Weeks   ? Status New   ? Target Date 11/02/21   ?  ? PT LONG TERM GOAL #2  ? Title Patient will increase FOTO score to equal to or greater than 74 to demonstrate statistically significant improvement in mobility and quality of life.   ? Baseline 10/19/21: 54   ? Time 6   ? Period Weeks   ? Status New   ? Target Date 11/30/21   ?  ? PT LONG TERM GOAL #3  ? Title Pt will report complete cessation of "pinching" pains in order to function pain-free at home, work and community without momentary pause due to brief yet severe pain.   ? Baseline 10/19/21: has become more frequent over the couple months   ? Time 6   ? Period Weeks   ? Status New   ? Target Date 11/30/21   ? ?  ?  ? ?  ? ? ? ? ? ? ? ? Plan - 10/24/21 0837   ? ? Clinical Impression Statement Pt is pleasant and motivated within session. Moderate pain noted during release of lateral intercostals. STM followed up by active and passive stretching as well as pec strengthening. Pt fatigued by end of session. 15# DB very heavy with mild lack of control in later sets - may decrease to  12# and work up to 15. Pt will benefit from PT services to address deficits in muscle length and muscle tension through left shoulder/axillary region in order to return to pain-free function at home and wor

## 2021-10-27 ENCOUNTER — Encounter: Payer: Self-pay | Admitting: Physical Therapy

## 2021-10-27 ENCOUNTER — Ambulatory Visit: Payer: BC Managed Care – PPO | Admitting: Physical Therapy

## 2021-10-27 DIAGNOSIS — M79622 Pain in left upper arm: Secondary | ICD-10-CM | POA: Diagnosis not present

## 2021-10-27 NOTE — Therapy (Signed)
[facility] ?[address] ?[place], [place], [address] ?Phone: [phone]   Fax:  [phone] ? ?Physical Therapy Treatment ? ?Patient Details  ?Name: LOYALTY ARENTZ ?MRN: [id number] ?Date of Birth: [birth year] ?No data recorded ? ?Encounter Date: 10/27/2021 ? ? PT End of Session - 10/29/21 0350   ? ? Visit Number 4   ? Number of Visits 13   ? Date for PT Re-Evaluation 11/30/21   ? Authorization Type [employer] Employee- 30VL per calendar year   ? Authorization Time Period 10/19/21-11/30/21   ? Authorization - Visit Number 4   ? Authorization - Number of Visits 30   ? Progress Note Due on Visit 10   ? PT Start Time 0938   ? PT Stop Time 0350   ? PT Time Calculation (min) 35 min   ? Activity Tolerance Patient tolerated treatment well;No increased pain   ? Behavior During Therapy [facility] for tasks assessed/performed   ? ?  ?  ? ?  ? ? ?Past Medical History:  ?Diagnosis Date  ? Abnormal breast biopsy   ? PASH, [name]  ? Breast cancer ([place]) 11/2017  ? left breast; ER/PR/Her2neu POS  ? Family history of breast cancer   ? Family history of ovarian cancer   ? Fibroadenoma of breast, left 2000, 2016  ? Genetic screening 08/26/2013  ? 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 Hypertension   ? Increased risk of breast cancer 2017  ? IBIS=47%  ? Left breast lump 06/18/2017  ? Fluid/drained [name]  ? Migraine   ? Monoallelic mutation of CHEK2 gene in female patient 2017  ? increased risk of breast and colon cancer  ? Personal history of chemotherapy   ? Personal history of radiation therapy   ? ? ?Past Surgical History:  ?Procedure Laterality Date  ? BREAST BIOPSY Bilateral 09/08/14  ? neg  ? BREAST BIOPSY Left 03/16/2015  ? Procedure: BREAST BIOPSY WITH NEEDLE LOCALIZATION;  Surgeon: [name], MD;  Location: [facility] ORS;  Service: General;  Laterality: Left;  ? BREAST BIOPSY Left 11/2017  ? positive  ? BREAST CYST ASPIRATION Left 06/18/2017  ? cyst  aspiration  ? BREAST EXCISIONAL BIOPSY Left 09/2014  ? BREAST EXCISIONAL BIOPSY Right   ? age 89's  ? BREAST RECONSTRUCTION WITH PLACEMENT OF TISSUE EXPANDER AND FLEX HD (ACELLULAR HYDRATED DERMIS) Left 05/20/2018  ? Procedure: BREAST RECONSTRUCTION WITH PLACEMENT OF TISSUE EXPANDER AND FLEX HD (ACELLULAR HYDRATED DERMIS);  Surgeon: [name], DO;  Location: [facility]S;  Service: Plastics;  Laterality: Left;  ? BREAST RECONSTRUCTION WITH PLACEMENT OF TISSUE EXPANDER AND FLEX HD (ACELLULAR HYDRATED DERMIS) Right 12/09/2018  ? Procedure: BREAST RECONSTRUCTION WITH PLACEMENT OF TISSUE EXPANDER AND FLEX HD (ACELLULAR HYDRATED DERMIS);  Surgeon: [name], DO;  Location: [facility];  Service: Plastics;  Laterality: Right;  ? BREAST SURGERY Right March 2000  ? benign fibroadenoma  ? BREAST SURGERY Left 08/08/13  ? excision  ? BREAST SURGERY Left 09/23/14  ? excision  ? COLONOSCOPY WITH PROPOFOL N/A 09/28/2021  ? Procedure: COLONOSCOPY WITH PROPOFOL;  Surgeon: [name], MD;  Location: [facility] ENDOSCOPY;  Service: Endoscopy;  Laterality: N/A;  ? ESOPHAGOGASTRODUODENOSCOPY (EGD) WITH PROPOFOL N/A 01/20/2019  ? Procedure: ESOPHAGOGASTRODUODENOSCOPY (EGD) WITH PROPOFOL;  Surgeon: [name], MD;  Location: [facility] ENDOSCOPY;  Service: Gastroenterology;  Laterality: N/A;  ? MASTECTOMY Left 05/20/2018  ? MASTECTOMY W/ SENTINEL NODE BIOPSY Left 05/20/2018  ?  Procedure: MASTECTOMY WITH SENTINEL LYMPH NODE BIOPSY;  Surgeon: [name], MD;  Location: [facility];  Service: General;  Laterality: Left;  ? PORT-A-CATH REMOVAL Right 03/31/2019  ? Procedure: REMOVAL PORT-A-CATH;  Surgeon: [name], DO;  Location: [facility];  Service: Plastics;  Laterality: Right;  ? PORTACATH PLACEMENT Right 12/17/2017  ? Procedure: INSERTION PORT-A-CATH;  Surgeon: [name], MD;  Location: [facility]S;  Service: General;  Laterality: Right;  ? REMOVAL OF BILATERAL TISSUE EXPANDERS WITH PLACEMENT OF BILATERAL  BREAST IMPLANTS Bilateral 03/31/2019  ? Procedure: REMOVAL OF BILATERAL TISSUE EXPANDERS WITH PLACEMENT OF BILATERAL BREAST IMPLANTS;  Surgeon: [name], DO;  Location: [facility];  Service: Plastics;  Laterality: Bilateral;  2 hours  ? SIMPLE MASTECTOMY WITH AXILLARY SENTINEL NODE BIOPSY Right 12/09/2018  ? Procedure: SIMPLE MASTECTOMY RIGHT;  Surgeon: [name], MD;  Location: [facility];  Service: General;  Laterality: Right;  ? ? ?There were no vitals filed for this visit. ? ? ? ? ? ? ? ?INTERVENTION: ?-STM to left pec major/minor, UT and left lateral intercostals ribs 6, 7 and 8 ?  ?- thoracic ext with hands behind head over foam roll x12 with cuing for breath control ?Hooklying wand BUE flex with thoracic ext x12  ?Education on using lacrosse ball for self massage, and posture with understanding ? ?  ?  ?  ? ? ? ? ? ? ? ? ? ? ? ? ? ? ? ? ? ? ? ? ? ? ? PT Education - 10/29/21 0917   ? ? Education Details therex form/technique   ? Person(s) Educated Patient   ? Methods Explanation;Demonstration;Verbal cues   ? Comprehension Verbalized understanding;Returned demonstration;Verbal cues required   ? ?  ?  ? ?  ? ? ? PT Short Term Goals - 10/19/21 1837   ? ?  ? PT SHORT TERM GOAL #1  ? Title Pt will be independent with HEP in order to improve strength and motion, and to decrease pain in order to improve pain-free function at home and work.   ? Time 2   ? Period Weeks   ? Status New   ? Target Date 11/02/21   ? ?  ?  ? ?  ? ? ? ? PT Long Term Goals - 10/19/21 1837   ? ?  ? PT LONG TERM GOAL #1  ? Title Pt will decrease worst pain as reported on NPRS by at least 3 points in order to demonstrate clinically significant reduction in pain.   ? Baseline 10/19/21: 7/10   ? Time 6   ? Period Weeks   ? Status New   ? Target Date 11/02/21   ?  ? PT LONG TERM GOAL #2  ? Title Patient will increase FOTO score to equal to or greater than 74 to demonstrate statistically significant improvement in mobility and quality  of life.   ? Baseline 10/19/21: 54   ? Time 6   ? Period Weeks   ? Status New   ? Target Date 11/30/21   ?  ? PT LONG TERM GOAL #3  ? Title Pt will report complete cessation of "pinching" pains in order to function pain-free at home, work and community without momentary pause due to brief yet severe pain.   ? Baseline 10/19/21: has become more frequent over the couple months   ? Time 6   ? Period Weeks   ? Status New   ? Target Date  11/30/21   ? ?  ?  ? ?  ? ? ? ? ? ? ? ? Plan - 10/29/21 0918   ? ? Clinical Impression Statement PT utilized manual techqniues for increased mobility with success. Pt with noted increased tension in pec major/minor, mid intercostals and UT L>R with decreased tension following manual techniques, allowing for increased shoulder mobility, though still restricted. PT led therex for increased thoracic mobility as well with patient able to comply with all cuing for proper technique. PT will continue progressiona as able.   ? Personal Factors and Comorbidities Age;Past/Current Experience;Comorbidity 1   ? Comorbidities History of CA   ? Examination-Activity Limitations Other   ? Examination-Participation Restrictions Community Activity   ? Stability/Clinical Decision Making Evolving/Moderate complexity   ? Clinical Decision Making Moderate   ? Rehab Potential Good   ? Clinical Impairments Affecting Rehab Potential (+) age, motivation, social support, (-) current sedentary lifestyle, ongoing cancer treatment, other comorbidities   ? PT Frequency 2x / week   ? PT Duration 6 weeks   ? PT Treatment/Interventions ADLs/Self Care Home Management;Aquatic Therapy;Electrical Stimulation;Cryotherapy;Moist Heat;Iontophoresis 61m/ml Dexamethasone;Functional mobility training;Therapeutic activities;Therapeutic exercise;Traction;Ultrasound;Neuromuscular re-education;Manual techniques;Patient/family education;Passive range of motion;Dry needling;Energy conservation;Taping;Manual lymph drainage   ? PT Next Visit  Plan intercostal release work   ? PT Home Exercise Plan pec stretch (doorway), W hold at wall, supine chest stretch on foam roll   ? Consulted and Agree with Plan of Care Patient   ? ?  ?  ? ?  ? ? ?Patient will

## 2021-10-31 ENCOUNTER — Encounter: Payer: Self-pay | Admitting: Physical Therapy

## 2021-10-31 ENCOUNTER — Ambulatory Visit: Payer: BC Managed Care – PPO | Admitting: Physical Therapy

## 2021-10-31 DIAGNOSIS — M79622 Pain in left upper arm: Secondary | ICD-10-CM

## 2021-10-31 NOTE — Therapy (Signed)
?[facility] ?[address] ?[place], [place], [address] ?Phone: [phone]   Fax:  [phone] ? ?Physical Therapy Treatment ? ?Patient Details  ?Name: Denise [name] ?MRN: [id number] ?Date of Birth: 11/12/1975 ?No data recorded ? ?Encounter Date: 10/31/2021 ? ? PT End of Session - 10/31/21 0907   ? ? Visit Number 5   ? Number of Visits 13   ? Date for PT Re-Evaluation 11/30/21   ? Authorization - Visit Number 5   ? Authorization - Number of Visits 30   ? Progress Note Due on Visit 10   ? PT Start Time 0830   ? PT Stop Time 0086   ? PT Time Calculation (min) 44 min   ? Activity Tolerance Patient tolerated treatment well;No increased pain   ? Behavior During Therapy [facility] for tasks assessed/performed   ? ?  ?  ? ?  ? ? ?Past Medical History:  ?Diagnosis Date  ? Abnormal breast biopsy   ? PASH, [name]  ? Breast cancer ([place]) 11/2017  ? left breast; ER/PR/Her2neu POS  ? Family history of breast cancer   ? Family history of ovarian cancer   ? Fibroadenoma of breast, left 2000, 2016  ? Genetic screening 08/26/2013  ? 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 Hypertension   ? Increased risk of breast cancer 2017  ? IBIS=47%  ? Left breast lump 06/18/2017  ? Fluid/drained [name]  ? Migraine   ? Monoallelic mutation of CHEK2 gene in female patient 2017  ? increased risk of breast and colon cancer  ? Personal history of chemotherapy   ? Personal history of radiation therapy   ? ? ?Past Surgical History:  ?Procedure Laterality Date  ? BREAST BIOPSY Bilateral 09/08/14  ? neg  ? BREAST BIOPSY Left 03/16/2015  ? Procedure: BREAST BIOPSY WITH NEEDLE LOCALIZATION;  Surgeon: [name], MD;  Location: [facility] ORS;  Service: General;  Laterality: Left;  ? BREAST BIOPSY Left 11/2017  ? positive  ? BREAST CYST ASPIRATION Left 06/18/2017  ? cyst aspiration  ? BREAST EXCISIONAL BIOPSY Left 09/2014  ? BREAST EXCISIONAL BIOPSY Right   ? age 34's  ? BREAST  RECONSTRUCTION WITH PLACEMENT OF TISSUE EXPANDER AND FLEX HD (ACELLULAR HYDRATED DERMIS) Left 05/20/2018  ? Procedure: BREAST RECONSTRUCTION WITH PLACEMENT OF TISSUE EXPANDER AND FLEX HD (ACELLULAR HYDRATED DERMIS);  Surgeon: [name], DO;  Location: [facility];  Service: Plastics;  Laterality: Left;  ? BREAST RECONSTRUCTION WITH PLACEMENT OF TISSUE EXPANDER AND FLEX HD (ACELLULAR HYDRATED DERMIS) Right 12/09/2018  ? Procedure: BREAST RECONSTRUCTION WITH PLACEMENT OF TISSUE EXPANDER AND FLEX HD (ACELLULAR HYDRATED DERMIS);  Surgeon: [name], DO;  Location: [facility];  Service: Plastics;  Laterality: Right;  ? BREAST SURGERY Right March 2000  ? benign fibroadenoma  ? BREAST SURGERY Left 08/08/13  ? excision  ? BREAST SURGERY Left 09/23/14  ? excision  ? COLONOSCOPY WITH PROPOFOL N/A 09/28/2021  ? Procedure: COLONOSCOPY WITH PROPOFOL;  Surgeon: [name], MD;  Location: [facility] ENDOSCOPY;  Service: Endoscopy;  Laterality: N/A;  ? ESOPHAGOGASTRODUODENOSCOPY (EGD) WITH PROPOFOL N/A 01/20/2019  ? Procedure: ESOPHAGOGASTRODUODENOSCOPY (EGD) WITH PROPOFOL;  Surgeon: [name], MD;  Location: [facility] ENDOSCOPY;  Service: Gastroenterology;  Laterality: N/A;  ? MASTECTOMY Left 05/20/2018  ? MASTECTOMY W/ SENTINEL NODE BIOPSY Left 05/20/2018  ? Procedure: MASTECTOMY WITH SENTINEL LYMPH NODE BIOPSY;  Surgeon: [name], MD;  Location: [facility] ORS;  Service: General;  Laterality: Left;  ? PORT-A-CATH REMOVAL Right 03/31/2019  ? Procedure: REMOVAL PORT-A-CATH;  Surgeon: [name], DO;  Location: [facility];  Service: Plastics;  Laterality: Right;  ? PORTACATH PLACEMENT Right 12/17/2017  ? Procedure: INSERTION PORT-A-CATH;  Surgeon: [name], MD;  Location: [facility] OR[facility];  Service: General;  Laterality: Right;  ? REMOVAL OF BILATERAL TISSUE EXPANDERS WITH PLACEMENT OF BILATERAL BREAST IMPLANTS Bilateral 03/31/2019  ? Procedure: REMOVAL OF BILATERAL TISSUE EXPANDERS WITH PLACEMENT OF  BILATERAL BREAST IMPLANTS;  Surgeon: [name], DO;  Location: [facility]S;  Service: Plastics;  Laterality: Bilateral;  2 hours  ? SIMPLE MASTECTOMY WITH AXILLARY SENTINEL NODE BIOPSY Right 12/09/2018  ? Procedure: SIMPLE MASTECTOMY RIGHT;  Surgeon: [name], MD;  Location: [facility] ORS;  Service: General;  Laterality: Right;  ? ? ?There were no vitals filed for this visit. ? ? Subjective Assessment - 10/31/21 0832   ? ? Subjective Patient reports same tension pain today. Has been stretching over the weekend. No true pain   ? Pertinent History Denise [name] is a 46yoF referred to OPPT by plastic surgery s/p progressive tightness/tenderness in left chest/axillary region s/p breast reconstruction in October 2020 with implants placed after bilateral mastectomy and left radiation. She also reports an occasional "pinching" that began following radiation however has become more frequent as of lately. The pain associated is sharp and brief; pt unable to correlate to specific movements as it happens at rest and with a variety of activities. It does not affect her ability to sleep at night. Pt states her plastic surgeon believes the tightness may be causing her left breast to be pulled laterally.   ? Limitations House hold activities   ? How long can you sit comfortably? unlimited   ? How long can you stand comfortably? unlimited   ? How long can you walk comfortably? unlimited   ? Patient Stated Goals Decrease sharp pains   ? Pain Onset 1 to 4 weeks ago   ? ?  ?  ? ?  ? ? ?INTERVENTION: ?-STM to left pec major, UT, and left lateral intercostals ribs 6, 7 and 8 ? Following Dry Needling: (2) 76m .25 needles placed along the L UT to decrease increased muscular spasms and trigger points with the patient positioned in supine. Patient was educated on risks and benefits of therapy and verbally consents to PT.  ? ?Ther-Ex ?Supine snow angels x12 with patient able to externally rotation L shoulder to hand on  mat ?OMEGA seated row 25# x10 35# 2x10 with good carry over of initial cuing for maintained posture  ?OMEGA chest press 25# 3x 10 with good carry over of proper technique ?- thoracic ext with hands behind head over foam roll x12 with cuing for breath control ? ? ? ? ? ? ? ? PT Education - 10/31/21 0906   ? ? Education Details therex form/technique   ? Person(s) Educated Patient   ? Methods Explanation;Demonstration;Verbal cues   ? Comprehension Verbalized understanding;Returned demonstration;Verbal cues required   ? ?  ?  ? ?  ? ? ? PT Short Term Goals - 10/19/21 1837   ? ?  ? PT SHORT TERM GOAL #1  ? Title Pt will be independent with HEP in order to improve strength and motion, and to decrease pain in order to improve pain-free function at home and work.   ? Time 2   ? Period Weeks   ? Status New   ?  Target Date 11/02/21   ? ?  ?  ? ?  ? ? ? ? PT Long Term Goals - 10/19/21 1837   ? ?  ? PT LONG TERM GOAL #1  ? Title Pt will decrease worst pain as reported on NPRS by at least 3 points in order to demonstrate clinically significant reduction in pain.   ? Baseline 10/19/21: 7/10   ? Time 6   ? Period Weeks   ? Status New   ? Target Date 11/02/21   ?  ? PT LONG TERM GOAL #2  ? Title Patient will increase FOTO score to equal to or greater than 74 to demonstrate statistically significant improvement in mobility and quality of life.   ? Baseline 10/19/21: 54   ? Time 6   ? Period Weeks   ? Status New   ? Target Date 11/30/21   ?  ? PT LONG TERM GOAL #3  ? Title Pt will report complete cessation of "pinching" pains in order to function pain-free at home, work and community without momentary pause due to brief yet severe pain.   ? Baseline 10/19/21: has become more frequent over the couple months   ? Time 6   ? Period Weeks   ? Status New   ? Target Date 11/30/21   ? ?  ?  ? ?  ? ? ? ? ? ? ? ? Plan - 10/31/21 0931   ? ? Clinical Impression Statement PT continued to utilize manual techniques with the addition of TDN with success.  Patient with decreased shoulder/scapular height following techniques, and palable decrease in trigger points in pec major/minor and UT. PT continued to encourage stretching and rolling to these areas to maintain decreased

## 2021-11-02 ENCOUNTER — Encounter: Payer: Self-pay | Admitting: Physical Therapy

## 2021-11-02 ENCOUNTER — Ambulatory Visit: Payer: BC Managed Care – PPO | Admitting: Physical Therapy

## 2021-11-02 DIAGNOSIS — M79622 Pain in left upper arm: Secondary | ICD-10-CM | POA: Diagnosis not present

## 2021-11-02 NOTE — Therapy (Signed)
[facility] ?[address] ?[place], [place], [address] ?Phone: [phone]   Fax:  [phone] ? ?Physical Therapy Treatment ? ?Patient Details  ?Name: Denise [name] ?MRN: [id number] ?Date of Birth: January 20, 1976 ?No data recorded ? ?Encounter Date: 11/02/2021 ? ? PT End of Session - 11/02/21 1551   ? ? Visit Number 6   ? Number of Visits 13   ? Date for PT Re-Evaluation 11/30/21   ? Authorization Type [employer] Employee- 30VL per calendar year   ? Authorization Time Period 10/19/21-11/30/21   ? Authorization - Visit Number 6   ? Authorization - Number of Visits 30   ? Progress Note Due on Visit 10   ? PT Start Time 5102   ? PT Stop Time 1600   ? PT Time Calculation (min) 43 min   ? Activity Tolerance Patient tolerated treatment well;No increased pain   ? Behavior During Therapy [facility] for tasks assessed/performed   ? ?  ?  ? ?  ? ? ?Past Medical History:  ?Diagnosis Date  ? Abnormal breast biopsy   ? PASH, [name]  ? Breast cancer ([place]) 11/2017  ? left breast; ER/PR/Her2neu POS  ? Family history of breast cancer   ? Family history of ovarian cancer   ? Fibroadenoma of breast, left 2000, 2016  ? Genetic screening 08/26/2013  ? 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 Hypertension   ? Increased risk of breast cancer 2017  ? IBIS=47%  ? Left breast lump 06/18/2017  ? Fluid/drained [name]  ? Migraine   ? Monoallelic mutation of CHEK2 gene in female patient 2017  ? increased risk of breast and colon cancer  ? Personal history of chemotherapy   ? Personal history of radiation therapy   ? ? ?Past Surgical History:  ?Procedure Laterality Date  ? BREAST BIOPSY Bilateral 09/08/14  ? neg  ? BREAST BIOPSY Left 03/16/2015  ? Procedure: BREAST BIOPSY WITH NEEDLE LOCALIZATION;  Surgeon: [name], MD;  Location: [facility] ORS;  Service: General;  Laterality: Left;  ? BREAST BIOPSY Left 11/2017  ? positive  ? BREAST CYST ASPIRATION Left 06/18/2017  ? cyst  aspiration  ? BREAST EXCISIONAL BIOPSY Left 09/2014  ? BREAST EXCISIONAL BIOPSY Right   ? age 89's  ? BREAST RECONSTRUCTION WITH PLACEMENT OF TISSUE EXPANDER AND FLEX HD (ACELLULAR HYDRATED DERMIS) Left 05/20/2018  ? Procedure: BREAST RECONSTRUCTION WITH PLACEMENT OF TISSUE EXPANDER AND FLEX HD (ACELLULAR HYDRATED DERMIS);  Surgeon: [name], DO;  Location: [facility];  Service: Plastics;  Laterality: Left;  ? BREAST RECONSTRUCTION WITH PLACEMENT OF TISSUE EXPANDER AND FLEX HD (ACELLULAR HYDRATED DERMIS) Right 12/09/2018  ? Procedure: BREAST RECONSTRUCTION WITH PLACEMENT OF TISSUE EXPANDER AND FLEX HD (ACELLULAR HYDRATED DERMIS);  Surgeon: [name], DO;  Location: [facility];  Service: Plastics;  Laterality: Right;  ? BREAST SURGERY Right March 2000  ? benign fibroadenoma  ? BREAST SURGERY Left 08/08/13  ? excision  ? BREAST SURGERY Left 09/23/14  ? excision  ? COLONOSCOPY WITH PROPOFOL N/A 09/28/2021  ? Procedure: COLONOSCOPY WITH PROPOFOL;  Surgeon: [name], MD;  Location: [facility];  Service: Endoscopy;  Laterality: N/A;  ? ESOPHAGOGASTRODUODENOSCOPY (EGD) WITH PROPOFOL N/A 01/20/2019  ? Procedure: ESOPHAGOGASTRODUODENOSCOPY (EGD) WITH PROPOFOL;  Surgeon: [name], MD;  Location: [facility] ENDOSCOPY;  Service: Gastroenterology;  Laterality: N/A;  ? MASTECTOMY Left 05/20/2018  ? MASTECTOMY W/ SENTINEL NODE BIOPSY Left 05/20/2018  ?  Procedure: MASTECTOMY WITH SENTINEL LYMPH NODE BIOPSY;  Surgeon: [name], MD;  Location: [facility];  Service: General;  Laterality: Left;  ? PORT-A-CATH REMOVAL Right 03/31/2019  ? Procedure: REMOVAL PORT-A-CATH;  Surgeon: [name], DO;  Location: [facility];  Service: Plastics;  Laterality: Right;  ? PORTACATH PLACEMENT Right 12/17/2017  ? Procedure: INSERTION PORT-A-CATH;  Surgeon: [name], MD;  Location: [facility];  Service: General;  Laterality: Right;  ? REMOVAL OF BILATERAL TISSUE EXPANDERS WITH PLACEMENT OF BILATERAL  BREAST IMPLANTS Bilateral 03/31/2019  ? Procedure: REMOVAL OF BILATERAL TISSUE EXPANDERS WITH PLACEMENT OF BILATERAL BREAST IMPLANTS;  Surgeon: [name], DO;  Location: [facility];  Service: Plastics;  Laterality: Bilateral;  2 hours  ? SIMPLE MASTECTOMY WITH AXILLARY SENTINEL NODE BIOPSY Right 12/09/2018  ? Procedure: SIMPLE MASTECTOMY RIGHT;  Surgeon: [name], MD;  Location: [facility];  Service: General;  Laterality: Right;  ? ? ?There were no vitals filed for this visit. ? ? Subjective Assessment - 11/02/21 1520   ? ? Subjective Patient reports she has noticed her L shoulder is lower than the right. NO pain reports some tension relief today.   ? Pertinent History Denise [name] is a 46yoF referred to OPPT by plastic surgery s/p progressive tightness/tenderness in left chest/axillary region s/p breast reconstruction in October 2020 with implants placed after bilateral mastectomy and left radiation. She also reports an occasional "pinching" that began following radiation however has become more frequent as of lately. The pain associated is sharp and brief; pt unable to correlate to specific movements as it happens at rest and with a variety of activities. It does not affect her ability to sleep at night. Pt states her plastic surgeon believes the tightness may be causing her left breast to be pulled laterally.   ? Limitations House hold activities   ? How long can you sit comfortably? unlimited   ? How long can you stand comfortably? unlimited   ? How long can you walk comfortably? unlimited   ? Patient Stated Goals Decrease sharp pains   ? Pain Onset 1 to 4 weeks ago   ? ?  ?  ? ?  ? ?INTERVENTION: ?-STM to left pec major, UT, and left lateral intercostals ribs 6, 7 and 8 ? Following Dry Needling: (2) 66m .25 needles placed along the L UT to decrease increased muscular spasms and trigger points with the patient positioned in supine. Patient was educated on risks and benefits of therapy and  verbally consents to PT.  ?  ?Ther-Ex ?OMEGA 45# 2x 8 with min cuing to prevent shoulder hiking with good carry over ?OMEGA chest press 35# 2x 10 with good carry over of proper technique from last session ?- thoracic ext with overhead BUE flex with dowel; over foam roll x12 with cuing for breath control ?CArdine Engpose 30sec hold ? ? ? ? ? ? ? ? ? ? ? ? ? ? ? ? ? ? ? ? ? ? ? ? ? ? ? PT Education - 11/02/21 1550   ? ? Education Details therex form/technique   ? Person(s) Educated Patient   ? Methods Explanation;Verbal cues;Demonstration   ? Comprehension Verbalized understanding;Returned demonstration;Verbal cues required   ? ?  ?  ? ?  ? ? ? PT Short Term Goals - 10/19/21 1837   ? ?  ? PT SHORT TERM GOAL #1  ? Title Pt will be independent with HEP in order to improve strength and  motion, and to decrease pain in order to improve pain-free function at home and work.   ? Time 2   ? Period Weeks   ? Status New   ? Target Date 11/02/21   ? ?  ?  ? ?  ? ? ? ? PT Long Term Goals - 10/19/21 1837   ? ?  ? PT LONG TERM GOAL #1  ? Title Pt will decrease worst pain as reported on NPRS by at least 3 points in order to demonstrate clinically significant reduction in pain.   ? Baseline 10/19/21: 7/10   ? Time 6   ? Period Weeks   ? Status New   ? Target Date 11/02/21   ?  ? PT LONG TERM GOAL #2  ? Title Patient will increase FOTO score to equal to or greater than 74 to demonstrate statistically significant improvement in mobility and quality of life.   ? Baseline 10/19/21: 54   ? Time 6   ? Period Weeks   ? Status New   ? Target Date 11/30/21   ?  ? PT LONG TERM GOAL #3  ? Title Pt will report complete cessation of "pinching" pains in order to function pain-free at home, work and community without momentary pause due to brief yet severe pain.   ? Baseline 10/19/21: has become more frequent over the couple months   ? Time 6   ? Period Weeks   ? Status New   ? Target Date 11/30/21   ? ?  ?  ? ?  ? ? ? ? ? ? ? ? Plan - 11/02/21 1557   ? ?  Clinical Impression Statement PT continued to utililze manual techniques with TDN with success. Paitnet reports 50% decreased tension sensation. PT continued therex progression for restoration of efficient length/ten

## 2021-11-07 ENCOUNTER — Ambulatory Visit: Payer: BC Managed Care – PPO

## 2021-11-07 DIAGNOSIS — M79622 Pain in left upper arm: Secondary | ICD-10-CM | POA: Diagnosis not present

## 2021-11-07 NOTE — Therapy (Signed)
[name] [address] [place], [address] Phone: [phone]   Fax:  [phone]  Physical Therapy Treatment  Patient Details  Name: Denise [name] MRN: [id number] Date of Birth: 10/12/1975 No data recorded  Encounter Date: 11/07/2021   PT End of Session - 11/07/21 0835     Visit Number 7    Number of Visits 13    Date for PT Re-Evaluation 11/30/21    Authorization Type [employer] Employee- 30VL per calendar year    Authorization Time Period 10/19/21-11/30/21    Authorization - Visit Number 6    Authorization - Number of Visits 30    Progress Note Due on Visit 10    PT Start Time 0832    PT Stop Time 0914    PT Time Calculation (min) 42 min    Activity Tolerance Patient tolerated treatment well;No increased pain    Behavior During Therapy WFL for tasks assessed/performed             Past Medical History:  Diagnosis Date   Abnormal breast biopsy    PASH, [name]   Breast cancer [facility] 11/2017   left breast; ER/PR/Her2neu POS   Family history of breast cancer    Family history of ovarian cancer    Fibroadenoma of breast, left 2000, 2016   Genetic screening 08/26/2013   BRCA/BART negative/Myriad, CHEK2 POS 2017   Hypertension    Increased risk of breast cancer 2017   IBIS=47%   Left breast lump 06/18/2017   Fluid/drained [name]   Migraine    Monoallelic mutation of CHEK2 gene in female patient 2017   increased risk of breast and colon cancer   Personal history of chemotherapy    Personal history of radiation therapy     Past Surgical History:  Procedure Laterality Date   BREAST BIOPSY Bilateral 09/08/14   neg   BREAST BIOPSY Left 03/16/2015   Procedure: BREAST BIOPSY WITH NEEDLE LOCALIZATION;  Surgeon: [name], MD;  Location: [facility];  Service: General;  Laterality: Left;   BREAST BIOPSY Left 11/2017   positive   BREAST CYST ASPIRATION Left 06/18/2017   cyst  aspiration   BREAST EXCISIONAL BIOPSY Left 09/2014   BREAST EXCISIONAL BIOPSY Right    age 86's   BREAST RECONSTRUCTION WITH PLACEMENT OF TISSUE EXPANDER AND FLEX HD (ACELLULAR HYDRATED DERMIS) Left 05/20/2018   Procedure: BREAST RECONSTRUCTION WITH PLACEMENT OF TISSUE EXPANDER AND FLEX HD (ACELLULAR HYDRATED DERMIS);  Surgeon: [name], DO;  Location: [facility];  Service: Plastics;  Laterality: Left;   BREAST RECONSTRUCTION WITH PLACEMENT OF TISSUE EXPANDER AND FLEX HD (ACELLULAR HYDRATED DERMIS) Right 12/09/2018   Procedure: BREAST RECONSTRUCTION WITH PLACEMENT OF TISSUE EXPANDER AND FLEX HD (ACELLULAR HYDRATED DERMIS);  Surgeon: [name], DO;  Location: [facility];  Service: Plastics;  Laterality: Right;   BREAST SURGERY Right March 2000   benign fibroadenoma   BREAST SURGERY Left 08/08/13   excision   BREAST SURGERY Left 09/23/14   excision   COLONOSCOPY WITH PROPOFOL N/A 09/28/2021   Procedure: COLONOSCOPY WITH PROPOFOL;  Surgeon: [name], MD;  Location: [place];  Service: Endoscopy;  Laterality: N/A;   ESOPHAGOGASTRODUODENOSCOPY (EGD) WITH PROPOFOL N/A 01/20/2019   Procedure: ESOPHAGOGASTRODUODENOSCOPY (EGD) WITH PROPOFOL;  Surgeon: [name], MD;  Location: [place];  Service: Gastroenterology;  Laterality: N/A;   MASTECTOMY Left 05/20/2018   MASTECTOMY W/ SENTINEL NODE BIOPSY Left 05/20/2018  Procedure: MASTECTOMY WITH SENTINEL LYMPH NODE BIOPSY;  Surgeon: [name], MD;  Location: [facility];  Service: General;  Laterality: Left;   PORT-A-CATH REMOVAL Right 03/31/2019   Procedure: REMOVAL PORT-A-CATH;  Surgeon: [name], DO;  Location: [facility];  Service: Plastics;  Laterality: Right;   PORTACATH PLACEMENT Right 12/17/2017   Procedure: INSERTION PORT-A-CATH;  Surgeon: [name], MD;  Location: [facility];  Service: General;  Laterality: Right;   REMOVAL OF BILATERAL TISSUE EXPANDERS WITH PLACEMENT OF BILATERAL  BREAST IMPLANTS Bilateral 03/31/2019   Procedure: REMOVAL OF BILATERAL TISSUE EXPANDERS WITH PLACEMENT OF BILATERAL BREAST IMPLANTS;  Surgeon: [name], DO;  Location: [facility];  Service: Plastics;  Laterality: Bilateral;  2 hours   SIMPLE MASTECTOMY WITH AXILLARY SENTINEL NODE BIOPSY Right 12/09/2018   Procedure: SIMPLE MASTECTOMY RIGHT;  Surgeon: [name], MD;  Location: [facility];  Service: General;  Laterality: Right;    There were no vitals filed for this visit.   Subjective Assessment - 11/07/21 0834     Subjective Pt reports no new complaints upon arrival and feels like her shoulder is looser today.  No pain reports upon arrival.    Pertinent History Denise [name] is a 46yoF referred to OPPT by plastic surgery s/p progressive tightness/tenderness in left chest/axillary region s/p breast reconstruction in October 2020 with implants placed after bilateral mastectomy and left radiation. She also reports an occasional "pinching" that began following radiation however has become more frequent as of lately. The pain associated is sharp and brief; pt unable to correlate to specific movements as it happens at rest and with a variety of activities. It does not affect her ability to sleep at night. Pt states her plastic surgeon believes the tightness may be causing her left breast to be pulled laterally.    Limitations House hold activities    How long can you sit comfortably? unlimited    How long can you stand comfortably? unlimited    How long can you walk comfortably? unlimited    Patient Stated Goals Decrease sharp pains    Currently in Pain? No/denies    Pain Onset 1 to 4 weeks ago                  INTERVENTION:  Manual  STM to left pec major, L biceps, UT, and left lateral intercostals ribs 6, 7 and 8 TP release technique applied to UT, L biceps, and serratus of the L side  Following Dry Needling: (4) 56m .25 needles placed along the L UT to decrease  increased muscular spasms and trigger points with the patient positioned in supine. Patient was educated on risks and benefits of therapy and verbally consents to PT.      Ther-Ex  OMEGA seated row 55# 3x10 with min cuing to prevent shoulder hiking with good carry over OMEGA chest press 35# 2x10, 1x5 to fatigue with good carry over of proper technique from last session OMEGA lat pull-downs 35# x10, 45# 2x10 Standing wall slides with lift off, focusing on limiting thoracic extension, 2x10     Clinical Assessment:  Pt responded well to the exercises given and was able to progress with new exercises in order to provide increased strengthening of shoulder capsular muscles.  Pt demonstrated decreased tissue tightness with DN approach today as well, noting only one muscle twitch response and pt has decreased pec tightness as well with STM.  Pt will continue to benefit from skilled therapy to progress  with strengthening of the L shoulder and decrease tissue extensibility of the capsular muscle group.         PT Short Term Goals - 10/19/21 1837       PT SHORT TERM GOAL #1   Title Pt will be independent with HEP in order to improve strength and motion, and to decrease pain in order to improve pain-free function at home and work.    Time 2    Period Weeks    Status New    Target Date 11/02/21               PT Long Term Goals - 10/19/21 1837       PT LONG TERM GOAL #1   Title Pt will decrease worst pain as reported on NPRS by at least 3 points in order to demonstrate clinically significant reduction in pain.    Baseline 10/19/21: 7/10    Time 6    Period Weeks    Status New    Target Date 11/02/21      PT LONG TERM GOAL #2   Title Patient will increase FOTO score to equal to or greater than 74 to demonstrate statistically significant improvement in mobility and quality of life.    Baseline 10/19/21: 54    Time 6    Period Weeks    Status New    Target Date 11/30/21       PT LONG TERM GOAL #3   Title Pt will report complete cessation of "pinching" pains in order to function pain-free at home, work and community without momentary pause due to brief yet severe pain.    Baseline 10/19/21: has become more frequent over the couple months    Time 6    Period Weeks    Status New    Target Date 11/30/21                   Plan - 11/07/21 0835     Personal Factors and Comorbidities Age;Past/Current Experience;Comorbidity 1    Comorbidities History of CA    Examination-Activity Limitations Other    Examination-Participation Restrictions Community Activity    Stability/Clinical Decision Making Evolving/Moderate complexity    Rehab Potential Good    Clinical Impairments Affecting Rehab Potential (+) age, motivation, social support, (-) current sedentary lifestyle, ongoing cancer treatment, other comorbidities    PT Frequency 2x / week    PT Duration 6 weeks    PT Treatment/Interventions ADLs/Self Care Home Management;Aquatic Therapy;Electrical Stimulation;Cryotherapy;Moist Heat;Iontophoresis 56m/ml Dexamethasone;Functional mobility training;Therapeutic activities;Therapeutic exercise;Traction;Ultrasound;Neuromuscular re-education;Manual techniques;Patient/family education;Passive range of motion;Dry needling;Energy conservation;Taping;Manual lymph drainage    PT Next Visit Plan intercostal release work    PT Home Exercise Plan pec stretch (doorway), W hold at wall, supine chest stretch on foam roll    Consulted and Agree with Plan of Care Patient             Patient will benefit from skilled therapeutic intervention in order to improve the following deficits and impairments:  Increased fascial restricitons, Impaired perceived functional ability, Impaired UE functional use, Pain, Impaired flexibility  Visit Diagnosis: Pain in left upper arm     Problem List Patient Active Problem List   Diagnosis Date Noted   Iron deficiency anemia due to chronic  blood loss 02/14/2019   Melena    Epistaxis 01/08/2019   Acquired absence of right breast 12/17/2018   Admission for breast reconstruction following mastectomy 12/09/2018   Use of tamoxifen (Nolvadex) 12/04/2018  Acquired absence of left breast 09/03/2018   Visual changes 08/20/2018   Breast asymmetry following reconstructive surgery 08/13/2018   Hot flashes due to menopause 06/18/2018   Status post left mastectomy 06/04/2018   S/P breast reconstruction, bilateral 05/28/2018   Vasomotor symptoms due to menopause 04/17/2018   Elevated LFTs 03/17/2018   Goals of care, counseling/discussion 03/17/2018   Hypomagnesemia 03/04/2018   Diarrhea 01/02/2018   Encounter for antineoplastic chemotherapy 12/21/2017   Encounter for antineoplastic immunotherapy 12/21/2017   Carrier of high risk cancer gene mutation 08/06/2017   Breast mass, right 62/86/3817   Monoallelic mutation of CHEK2 gene in female patient 06/20/2015   Carcinoma of upper-outer quadrant of left breast in female, estrogen receptor positive ([place]) 09/17/2014    Gwenlyn Saran, PT, DPT 11/07/21, 9:33 AM   [facility] [address], [place], [address] Phone: [phone]   Fax:  [phone]  Name: Denise [name] MRN: [id number] Date of Birth: 05/30/1976

## 2021-11-10 ENCOUNTER — Encounter: Payer: Self-pay | Admitting: Physical Therapy

## 2021-11-10 ENCOUNTER — Ambulatory Visit: Payer: BC Managed Care – PPO

## 2021-11-10 DIAGNOSIS — M79622 Pain in left upper arm: Secondary | ICD-10-CM | POA: Diagnosis not present

## 2021-11-10 NOTE — Therapy (Signed)
[facility] [address], [place], [address] Phone: [phone]   Fax:  [phone]  Physical Therapy Treatment  Patient Details  Name: SHAYLEIGH BOULDIN MRN: [id number] Date of Birth: 07-22-1975 No data recorded  Encounter Date: 11/10/2021   PT End of Session - 11/10/21 1520     Visit Number 8    Number of Visits 13    Date for PT Re-Evaluation 11/30/21    Authorization Type [employer] State Employee- 30VL per calendar year    Authorization Time Period 10/19/21-11/30/21    Authorization - Visit Number 6    Authorization - Number of Visits 30    Progress Note Due on Visit 10    PT Start Time 7412    PT Stop Time 1558    PT Time Calculation (min) 42 min    Activity Tolerance Patient tolerated treatment well;No increased pain    Behavior During Therapy WFL for tasks assessed/performed             Past Medical History:  Diagnosis Date   Abnormal breast biopsy    PASH, [name]   Breast cancer [facility]) 11/2017   left breast; ER/PR/Her2neu POS   Family history of breast cancer    Family history of ovarian cancer    Fibroadenoma of breast, left 2000, 2016   Genetic screening 08/26/2013   BRCA/BART negative/[facility]riad, CHEK2 POS 2017   Hypertension    Increased risk of breast cancer 2017   IBIS=47%   Left breast lump 06/18/2017   Fluid/drained [name]   Migraine    Monoallelic mutation of CHEK2 gene in female patient 2017   increased risk of breast and colon cancer   Personal history of chemotherapy    Personal history of radiation therapy     Past Surgical History:  Procedure Laterality Date   BREAST BIOPSY Bilateral 09/08/14   neg   BREAST BIOPSY Left 03/16/2015   Procedure: BREAST BIOPSY WITH NEEDLE LOCALIZATION;  Surgeon: [name], MD;  Location: [facility];  Service: General;  Laterality: Left;   BREAST BIOPSY Left 11/2017   positive   BREAST CYST ASPIRATION Left 06/18/2017   cyst  aspiration   BREAST EXCISIONAL BIOPSY Left 09/2014   BREAST EXCISIONAL BIOPSY Right    age 65's   BREAST RECONSTRUCTION WITH PLACEMENT OF TISSUE EXPANDER AND FLEX HD (ACELLULAR HYDRATED DERMIS) Left 05/20/2018   Procedure: BREAST RECONSTRUCTION WITH PLACEMENT OF TISSUE EXPANDER AND FLEX HD (ACELLULAR HYDRATED DERMIS);  Surgeon: [name], DO;  Location: [facility];  Service: Plastics;  Laterality: Left;   BREAST RECONSTRUCTION WITH PLACEMENT OF TISSUE EXPANDER AND FLEX HD (ACELLULAR HYDRATED DERMIS) Right 12/09/2018   Procedure: BREAST RECONSTRUCTION WITH PLACEMENT OF TISSUE EXPANDER AND FLEX HD (ACELLULAR HYDRATED DERMIS);  Surgeon: [name], DO;  Location: [facility];  Service: Plastics;  Laterality: Right;   BREAST SURGERY Right March 2000   benign fibroadenoma   BREAST SURGERY Left 08/08/13   excision   BREAST SURGERY Left 09/23/14   excision   COLONOSCOPY WITH PROPOFOL N/A 09/28/2021   Procedure: COLONOSCOPY WITH PROPOFOL;  Surgeon: [name], MD;  Location: [facility];  Service: Endoscopy;  Laterality: N/A;   ESOPHAGOGASTRODUODENOSCOPY (EGD) WITH PROPOFOL N/A 01/20/2019   Procedure: ESOPHAGOGASTRODUODENOSCOPY (EGD) WITH PROPOFOL;  Surgeon: [name], MD;  Location: [facility];  Service: Gastroenterology;  Laterality: N/A;   MASTECTOMY Left 05/20/2018   MASTECTOMY W/ SENTINEL NODE BIOPSY Left 05/20/2018  Procedure: MASTECTOMY WITH SENTINEL LYMPH NODE BIOPSY;  Surgeon: [name], MD;  Location: [facility];  Service: General;  Laterality: Left;   PORT-A-CATH REMOVAL Right 03/31/2019   Procedure: REMOVAL PORT-A-CATH;  Surgeon: [name], DO;  Location: [facility];  Service: Plastics;  Laterality: Right;   PORTACATH PLACEMENT Right 12/17/2017   Procedure: INSERTION PORT-A-CATH;  Surgeon: [name], MD;  Location: [facility];  Service: General;  Laterality: Right;   REMOVAL OF BILATERAL TISSUE EXPANDERS WITH PLACEMENT OF BILATERAL  BREAST IMPLANTS Bilateral 03/31/2019   Procedure: REMOVAL OF BILATERAL TISSUE EXPANDERS WITH PLACEMENT OF BILATERAL BREAST IMPLANTS;  Surgeon: [name], DO;  Location: [facility];  Service: Plastics;  Laterality: Bilateral;  2 hours   SIMPLE MASTECTOMY WITH AXILLARY SENTINEL NODE BIOPSY Right 12/09/2018   Procedure: SIMPLE MASTECTOMY RIGHT;  Surgeon: [name], MD;  Location: [facility];  Service: General;  Laterality: Right;    There were no vitals filed for this visit.   Subjective Assessment - 11/10/21 1518     Subjective Pt denies pain. Reports continuing to focus on L shoulder mobility and strengthening.    Pertinent History Anaaya Fuster is a 46yoF referred to OPPT by plastic surgery s/p progressive tightness/tenderness in left chest/axillary region s/p breast reconstruction in October 2020 with implants placed after bilateral mastectomy and left radiation. She also reports an occasional "pinching" that began following radiation however has become more frequent as of lately. The pain associated is sharp and brief; pt unable to correlate to specific movements as it happens at rest and with a variety of activities. It does not affect her ability to sleep at night. Pt states her plastic surgeon believes the tightness may be causing her left breast to be pulled laterally.    Limitations House hold activities    How long can you sit comfortably? unlimited    How long can you stand comfortably? unlimited    How long can you walk comfortably? unlimited    Patient Stated Goals Decrease sharp pains    Currently in Pain? No/denies    Pain Onset 1 to 4 weeks ago            Manual therapy with pt in supine   STM to left pec major and L upper trap for 20 min for trigger point release to improve L shoulder mobility.        There.ex:   OMEGA seated row 55# 2x10 with min cuing to prevent shoulder hiking with good carry over  OMEGA chest press 35# 2x10, 1x5 to fatigue with  good carry over of proper technique from last session  OMEGA lat pull-downs 35# x10, 45# 2x10  Standing Y's: 2x12  Standing bilateral isometric ER with Shoulder elevation:  2x12, YTB. Min VC's for form/technique with maintaining isometric contraction.  Standing overhead dowel raises with 3# AW: x15. 2x15 with 5# AW    PT Education - 11/10/21 1519     Education Details form/technique with exercise.    Person(s) Educated Patient    Methods Explanation;Demonstration;Tactile cues    Comprehension Verbalized understanding;Returned demonstration;Verbal cues required              PT Short Term Goals - 10/19/21 1837       PT SHORT TERM GOAL #1   Title Pt will be independent with HEP in order to improve strength and motion, and to decrease pain in order to improve pain-free function at home and work.    Time  2    Period Weeks    Status New    Target Date 11/02/21               PT Long Term Goals - 10/19/21 1837       PT LONG TERM GOAL #1   Title Pt will decrease worst pain as reported on NPRS by at least 3 points in order to demonstrate clinically significant reduction in pain.    Baseline 10/19/21: 7/10    Time 6    Period Weeks    Status New    Target Date 11/02/21      PT LONG TERM GOAL #2   Title Patient will increase FOTO score to equal to or greater than 74 to demonstrate statistically significant improvement in mobility and quality of life.    Baseline 10/19/21: 54    Time 6    Period Weeks    Status New    Target Date 11/30/21      PT LONG TERM GOAL #3   Title Pt will report complete cessation of "pinching" pains in order to function pain-free at home, work and community without momentary pause due to brief yet severe pain.    Baseline 10/19/21: has become more frequent over the couple months    Time 6    Period Weeks    Status New    Target Date 11/30/21                   Plan - 11/10/21 1617     Clinical Impression Statement Continuing PT POC  with focus on improving L shoulder mobility with manual techniques to upper trapezius and L pec major. Pt tolerating maintained resistance training and progressive overhead mobility without pain. Pt will continue to benefit from skilled PT services to progress L shoulder mobility and strength to return to PLOF.    Personal Factors and Comorbidities Age;Past/Current Experience;Comorbidity 1    Comorbidities History of CA    Examination-Activity Limitations Other    Examination-Participation Restrictions Community Activity    Stability/Clinical Decision Making Evolving/Moderate complexity    Rehab Potential Good    Clinical Impairments Affecting Rehab Potential (+) age, motivation, social support, (-) current sedentary lifestyle, ongoing cancer treatment, other comorbidities    PT Frequency 2x / week    PT Duration 6 weeks    PT Treatment/Interventions ADLs/Self Care Home Management;Aquatic Therapy;Electrical Stimulation;Cryotherapy;Moist Heat;Iontophoresis 49m/ml Dexamethasone;Functional mobility training;Therapeutic activities;Therapeutic exercise;Traction;Ultrasound;Neuromuscular re-education;Manual techniques;Patient/family education;Passive range of motion;Dry needling;Energy conservation;Taping;Manual lymph drainage    PT Next Visit Plan intercostal release work    PT Home Exercise Plan pec stretch (doorway), W hold at wall, supine chest stretch on foam roll    Consulted and Agree with Plan of Care Patient             Patient will benefit from skilled therapeutic intervention in order to improve the following deficits and impairments:  Increased fascial restricitons, Impaired perceived functional ability, Impaired UE functional use, Pain, Impaired flexibility  Visit Diagnosis: Pain in left upper arm     Problem List Patient Active Problem List   Diagnosis Date Noted   Iron deficiency anemia due to chronic blood loss 02/14/2019   Melena    Epistaxis 01/08/2019   Acquired absence  of right breast 12/17/2018   Admission for breast reconstruction following mastectomy 12/09/2018   Use of tamoxifen (Nolvadex) 12/04/2018   Acquired absence of left breast 09/03/2018   Visual changes 08/20/2018   Breast asymmetry following reconstructive surgery  08/13/2018   Hot flashes due to menopause 06/18/2018   Status post left mastectomy 06/04/2018   S/P breast reconstruction, bilateral 05/28/2018   Vasomotor symptoms due to menopause 04/17/2018   Elevated LFTs 03/17/2018   Goals of care, counseling/discussion 03/17/2018   Hypomagnesemia 03/04/2018   Diarrhea 01/02/2018   Encounter for antineoplastic chemotherapy 12/21/2017   Encounter for antineoplastic immunotherapy 12/21/2017   Carrier of high risk cancer gene mutation 08/06/2017   Breast mass, right 02/56/1548   Monoallelic mutation of CHEK2 gene in female patient 06/20/2015   Carcinoma of upper-outer quadrant of left breast in female, estrogen receptor positive ([place]) 09/17/2014    Salem Caster. Fairly IV, PT, DPT Physical Therapist- [facility]  11/10/2021, 4:23 PM  [facility] [address] [place], [address] Phone: [phone]   Fax:  [phone]  Name: FLOREINE KINGDON MRN: [id number] Date of Birth: 1975-07-13

## 2021-11-16 ENCOUNTER — Encounter: Payer: BC Managed Care – PPO | Admitting: Physical Therapy

## 2021-11-18 ENCOUNTER — Encounter: Payer: BC Managed Care – PPO | Admitting: Physical Therapy

## 2021-11-21 ENCOUNTER — Other Ambulatory Visit: Payer: Self-pay | Admitting: Oncology

## 2021-11-22 ENCOUNTER — Ambulatory Visit
Admission: RE | Admit: 2021-11-22 | Discharge: 2021-11-22 | Disposition: A | Discharge status: Home / Self Care | Payer: BC Managed Care – PPO | Source: Ambulatory Visit | Attending: Internal Medicine | Admitting: Internal Medicine

## 2021-11-22 DIAGNOSIS — C50419 Malignant neoplasm of upper-outer quadrant of unspecified female breast: Secondary | ICD-10-CM | POA: Insufficient documentation

## 2021-11-23 ENCOUNTER — Ambulatory Visit: Payer: BC Managed Care – PPO | Attending: Surgical | Admitting: Physical Therapy

## 2021-11-23 ENCOUNTER — Encounter: Payer: Self-pay | Admitting: Physical Therapy

## 2021-11-23 DIAGNOSIS — M79622 Pain in left upper arm: Secondary | ICD-10-CM | POA: Diagnosis present

## 2021-11-23 NOTE — Therapy (Signed)
[facility] [address], [place], [address] Phone: [phone]   Fax:  [phone]  Physical Therapy Treatment  Patient Details  Name: Denise [name] MRN: [id number] Date of Birth: 10/23/1975 No data recorded  Encounter Date: 11/23/2021   PT End of Session - 11/23/21 1452     Visit Number 9    Number of Visits 13    Date for PT Re-Evaluation 11/30/21    Authorization Type [employer] State Employee- 30VL per calendar year    Authorization Time Period 10/19/21-11/30/21    Authorization - Visit Number 9    Authorization - Number of Visits 30    Progress Note Due on Visit 10    PT Start Time 8315    PT Stop Time 1512    PT Time Calculation (min) 39 min    Activity Tolerance Patient tolerated treatment well;No increased pain    Behavior During Therapy WFL for tasks assessed/performed             Past Medical History:  Diagnosis Date   Abnormal breast biopsy    PASH, [name]   Breast cancer [facility]) 11/2017   left breast; ER/PR/Her2neu POS   Family history of breast cancer    Family history of ovarian cancer    Fibroadenoma of breast, left 2000, 2016   Genetic screening 08/26/2013   BRCA/BART negative/[facility]riad, CHEK2 POS 2017   Hypertension    Increased risk of breast cancer 2017   IBIS=47%   Left breast lump 06/18/2017   Fluid/drained [name]   Migraine    Monoallelic mutation of CHEK2 gene in female patient 2017   increased risk of breast and colon cancer   Personal history of chemotherapy    Personal history of radiation therapy     Past Surgical History:  Procedure Laterality Date   BREAST BIOPSY Bilateral 09/08/14   neg   BREAST BIOPSY Left 03/16/2015   Procedure: BREAST BIOPSY WITH NEEDLE LOCALIZATION;  Surgeon: [name], MD;  Location: [facility];  Service: General;  Laterality: Left;   BREAST BIOPSY Left 11/2017   positive   BREAST CYST ASPIRATION Left 06/18/2017   cyst  aspiration   BREAST EXCISIONAL BIOPSY Left 09/2014   BREAST EXCISIONAL BIOPSY Right    age 46's   BREAST RECONSTRUCTION WITH PLACEMENT OF TISSUE EXPANDER AND FLEX HD (ACELLULAR HYDRATED DERMIS) Left 05/20/2018   Procedure: BREAST RECONSTRUCTION WITH PLACEMENT OF TISSUE EXPANDER AND FLEX HD (ACELLULAR HYDRATED DERMIS);  Surgeon: [name], DO;  Location: [facility];  Service: Plastics;  Laterality: Left;   BREAST RECONSTRUCTION WITH PLACEMENT OF TISSUE EXPANDER AND FLEX HD (ACELLULAR HYDRATED DERMIS) Right 12/09/2018   Procedure: BREAST RECONSTRUCTION WITH PLACEMENT OF TISSUE EXPANDER AND FLEX HD (ACELLULAR HYDRATED DERMIS);  Surgeon: [name], DO;  Location: [facility];  Service: Plastics;  Laterality: Right;   BREAST SURGERY Right March 2000   benign fibroadenoma   BREAST SURGERY Left 08/08/13   excision   BREAST SURGERY Left 09/23/14   excision   COLONOSCOPY WITH PROPOFOL N/A 09/28/2021   Procedure: COLONOSCOPY WITH PROPOFOL;  Surgeon: [name], MD;  Location: [place];  Service: Endoscopy;  Laterality: N/A;   ESOPHAGOGASTRODUODENOSCOPY (EGD) WITH PROPOFOL N/A 01/20/2019   Procedure: ESOPHAGOGASTRODUODENOSCOPY (EGD) WITH PROPOFOL;  Surgeon: [name], MD;  Location: [place];  Service: Gastroenterology;  Laterality: N/A;   MASTECTOMY Left 05/20/2018   MASTECTOMY W/ SENTINEL NODE BIOPSY Left 05/20/2018  Procedure: MASTECTOMY WITH SENTINEL LYMPH NODE BIOPSY;  Surgeon: [name], MD;  Location: [facility];  Service: General;  Laterality: Left;   PORT-A-CATH REMOVAL Right 03/31/2019   Procedure: REMOVAL PORT-A-CATH;  Surgeon: [name], DO;  Location: [facility];  Service: Plastics;  Laterality: Right;   PORTACATH PLACEMENT Right 12/17/2017   Procedure: INSERTION PORT-A-CATH;  Surgeon: [name], MD;  Location: [facility];  Service: General;  Laterality: Right;   REMOVAL OF BILATERAL TISSUE EXPANDERS WITH PLACEMENT OF BILATERAL  BREAST IMPLANTS Bilateral 03/31/2019   Procedure: REMOVAL OF BILATERAL TISSUE EXPANDERS WITH PLACEMENT OF BILATERAL BREAST IMPLANTS;  Surgeon: [name], DO;  Location: [facility];  Service: Plastics;  Laterality: Bilateral;  2 hours   SIMPLE MASTECTOMY WITH AXILLARY SENTINEL NODE BIOPSY Right 12/09/2018   Procedure: SIMPLE MASTECTOMY RIGHT;  Surgeon: [name], MD;  Location: [facility];  Service: General;  Laterality: Right;    There were no vitals filed for this visit.   Subjective Assessment - 11/23/21 1433     Subjective Pt reports her shoulder is a little tighter d/t week off from PT. Pt reports feeling better overall- with a small current set back.    Pertinent History [name] is a 46yoF referred to OPPT by plastic surgery s/p progressive tightness/tenderness in left chest/axillary region s/p breast reconstruction in October 2020 with implants placed after bilateral mastectomy and left radiation. She also reports an occasional "pinching" that began following radiation however has become more frequent as of lately. The pain associated is sharp and brief; pt unable to correlate to specific movements as it happens at rest and with a variety of activities. It does not affect her ability to sleep at night. Pt states her plastic surgeon believes the tightness may be causing her left breast to be pulled laterally.    Limitations House hold activities    How long can you sit comfortably? unlimited    How long can you stand comfortably? unlimited    How long can you walk comfortably? unlimited    Patient Stated Goals Decrease sharp pains    Pain Onset 1 to 4 weeks ago             Manual therapy with pt in supine   STM to left pec major and L upper trap for 20 min for trigger point release to improve L shoulder mobility.        There.ex: Bucket handle breathing 83mn for increased intercostal stretch with good carry over  Half moon with rotation stretch 30sec  hold  OMEGA lat pulldown 45# 3x 10 with min cuing for eccentric control with good carry over   OMEGA chest press 35# 3x 10/10/8 with min cuing for eccentric control  Bent over dumbbell fly short lever 2# DB with good carry over of demo for scapular retraction   Pec dtretch overhead 30sec in doorway                            PT Education - 11/23/21 1452     Education Details therex form/technique    Person(s) Educated Patient    Methods Explanation;Demonstration;Verbal cues    Comprehension Verbalized understanding;Returned demonstration;Verbal cues required              PT Short Term Goals - 10/19/21 1837       PT SHORT TERM GOAL #1   Title Pt will be independent with HEP in  order to improve strength and motion, and to decrease pain in order to improve pain-free function at home and work.    Time 2    Period Weeks    Status New    Target Date 11/02/21               PT Long Term Goals - 10/19/21 1837       PT LONG TERM GOAL #1   Title Pt will decrease worst pain as reported on NPRS by at least 3 points in order to demonstrate clinically significant reduction in pain.    Baseline 10/19/21: 7/10    Time 6    Period Weeks    Status New    Target Date 11/02/21      PT LONG TERM GOAL #2   Title Patient will increase FOTO score to equal to or greater than 74 to demonstrate statistically significant improvement in mobility and quality of life.    Baseline 10/19/21: 54    Time 6    Period Weeks    Status New    Target Date 11/30/21      PT LONG TERM GOAL #3   Title Pt will report complete cessation of "pinching" pains in order to function pain-free at home, work and community without momentary pause due to brief yet severe pain.    Baseline 10/19/21: has become more frequent over the couple months    Time 6    Period Weeks    Status New    Target Date 11/30/21                   Plan - 11/23/21 1459     Clinical Impression  Statement PT ocntinue dPOC for increased mobility, with continued addition of intercostal mobility with success. patient is able to comply with all cuing for proper technique of therex with excellent motivation throughout session. Paitent with no increased pain throughout session, continued good mobility overall. PT will continue progression as able.    Personal Factors and Comorbidities Age;Past/Current Experience;Comorbidity 1    Comorbidities History of CA    Examination-Activity Limitations Other    Examination-Participation Restrictions Community Activity    Stability/Clinical Decision Making Evolving/Moderate complexity    Clinical Decision Making Moderate    Rehab Potential Good    Clinical Impairments Affecting Rehab Potential (+) age, motivation, social support, (-) current sedentary lifestyle, ongoing cancer treatment, other comorbidities    PT Frequency 2x / week    PT Duration 6 weeks    PT Treatment/Interventions ADLs/Self Care Home Management;Aquatic Therapy;Electrical Stimulation;Cryotherapy;Moist Heat;Iontophoresis 29m/ml Dexamethasone;Functional mobility training;Therapeutic activities;Therapeutic exercise;Traction;Ultrasound;Neuromuscular re-education;Manual techniques;Patient/family education;Passive range of motion;Dry needling;Energy conservation;Taping;Manual lymph drainage    PT Next Visit Plan intercostal release work    PT Home Exercise Plan pec stretch (doorway), W hold at wall, supine chest stretch on foam roll    Consulted and Agree with Plan of Care Patient             Patient will benefit from skilled therapeutic intervention in order to improve the following deficits and impairments:  Increased fascial restricitons, Impaired perceived functional ability, Impaired UE functional use, Pain, Impaired flexibility  Visit Diagnosis: Pain in left upper arm     Problem List Patient Active Problem List   Diagnosis Date Noted   Iron deficiency anemia due to  chronic blood loss 02/14/2019   Melena    Epistaxis 01/08/2019   Acquired absence of right breast 12/17/2018   Admission for breast reconstruction following  mastectomy 12/09/2018   Use of tamoxifen (Nolvadex) 12/04/2018   Acquired absence of left breast 09/03/2018   Visual changes 08/20/2018   Breast asymmetry following reconstructive surgery 08/13/2018   Hot flashes due to menopause 06/18/2018   Status post left mastectomy 06/04/2018   S/P breast reconstruction, bilateral 05/28/2018   Vasomotor symptoms due to menopause 04/17/2018   Elevated LFTs 03/17/2018   Goals of care, counseling/discussion 03/17/2018   Hypomagnesemia 03/04/2018   Diarrhea 01/02/2018   Encounter for antineoplastic chemotherapy 12/21/2017   Encounter for antineoplastic immunotherapy 12/21/2017   Carrier of high risk cancer gene mutation 08/06/2017   Breast mass, right 96/28/3662   Monoallelic mutation of CHEK2 gene in female patient 06/20/2015   Carcinoma of upper-outer quadrant of left breast in female, estrogen receptor positive ([place]) 09/17/2014   Durwin Reges DPT Durwin Reges, PT 11/24/2021, 9:53 AM  [facility] [address], [place], [address] Phone: [phone]   Fax:  [phone]  Name: ALEXANDRYA CHIM MRN: [id number] Date of Birth: [birth year]

## 2021-11-29 ENCOUNTER — Inpatient Hospital Stay: Payer: BC Managed Care – PPO | Attending: Internal Medicine

## 2021-11-29 ENCOUNTER — Other Ambulatory Visit: Payer: Self-pay

## 2021-11-29 DIAGNOSIS — Z1502 Genetic susceptibility to malignant neoplasm of ovary: Secondary | ICD-10-CM | POA: Diagnosis not present

## 2021-11-29 DIAGNOSIS — Z800 Family history of malignant neoplasm of digestive organs: Secondary | ICD-10-CM | POA: Insufficient documentation

## 2021-11-29 DIAGNOSIS — Z170 Estrogen receptor positive status [ER+]: Secondary | ICD-10-CM | POA: Insufficient documentation

## 2021-11-29 DIAGNOSIS — Z8041 Family history of malignant neoplasm of ovary: Secondary | ICD-10-CM | POA: Insufficient documentation

## 2021-11-29 DIAGNOSIS — Z79810 Long term (current) use of selective estrogen receptor modulators (SERMs): Secondary | ICD-10-CM | POA: Diagnosis not present

## 2021-11-29 DIAGNOSIS — Z79899 Other long term (current) drug therapy: Secondary | ICD-10-CM | POA: Insufficient documentation

## 2021-11-29 DIAGNOSIS — Z803 Family history of malignant neoplasm of breast: Secondary | ICD-10-CM | POA: Diagnosis not present

## 2021-11-29 DIAGNOSIS — Z1509 Genetic susceptibility to other malignant neoplasm: Secondary | ICD-10-CM | POA: Insufficient documentation

## 2021-11-29 DIAGNOSIS — C50419 Malignant neoplasm of upper-outer quadrant of unspecified female breast: Secondary | ICD-10-CM

## 2021-11-29 DIAGNOSIS — Z923 Personal history of irradiation: Secondary | ICD-10-CM | POA: Insufficient documentation

## 2021-11-29 DIAGNOSIS — R232 Flushing: Secondary | ICD-10-CM | POA: Insufficient documentation

## 2021-11-29 DIAGNOSIS — Z1501 Genetic susceptibility to malignant neoplasm of breast: Secondary | ICD-10-CM | POA: Insufficient documentation

## 2021-11-29 DIAGNOSIS — C50412 Malignant neoplasm of upper-outer quadrant of left female breast: Secondary | ICD-10-CM | POA: Insufficient documentation

## 2021-11-29 DIAGNOSIS — Z9013 Acquired absence of bilateral breasts and nipples: Secondary | ICD-10-CM | POA: Insufficient documentation

## 2021-11-29 DIAGNOSIS — Z9221 Personal history of antineoplastic chemotherapy: Secondary | ICD-10-CM | POA: Insufficient documentation

## 2021-11-29 LAB — CBC WITH DIFFERENTIAL/PLATELET
Abs Immature Granulocytes: 0.02 10*3/uL (ref 0.00–0.07)
Basophils Absolute: 0 10*3/uL (ref 0.0–0.1)
Basophils Relative: 0 %
Eosinophils Absolute: 0.2 10*3/uL (ref 0.0–0.5)
Eosinophils Relative: 3 %
HCT: 38.4 % (ref 36.0–46.0)
Hemoglobin: 12.8 g/dL (ref 12.0–15.0)
Immature Granulocytes: 0 %
Lymphocytes Relative: 23 %
Lymphs Abs: 1.9 10*3/uL (ref 0.7–4.0)
MCH: 29.4 pg (ref 26.0–34.0)
MCHC: 33.3 g/dL (ref 30.0–36.0)
MCV: 88.3 fL (ref 80.0–100.0)
Monocytes Absolute: 0.6 10*3/uL (ref 0.1–1.0)
Monocytes Relative: 8 %
Neutro Abs: 5.2 10*3/uL (ref 1.7–7.7)
Neutrophils Relative %: 66 %
Platelets: 228 10*3/uL (ref 150–400)
RBC: 4.35 MIL/uL (ref 3.87–5.11)
RDW: 13.5 % (ref 11.5–15.5)
WBC: 8 10*3/uL (ref 4.0–10.5)
nRBC: 0 % (ref 0.0–0.2)

## 2021-11-29 LAB — COMPREHENSIVE METABOLIC PANEL
ALT: 19 U/L (ref 0–44)
AST: 22 U/L (ref 15–41)
Albumin: 4.3 g/dL (ref 3.5–5.0)
Alkaline Phosphatase: 32 U/L — ABNORMAL LOW (ref 38–126)
Anion gap: 9 (ref 5–15)
BUN: 12 mg/dL (ref 6–20)
CO2: 26 mmol/L (ref 22–32)
Calcium: 8.7 mg/dL — ABNORMAL LOW (ref 8.9–10.3)
Chloride: 101 mmol/L (ref 98–111)
Creatinine, Ser: 0.74 mg/dL (ref 0.44–1.00)
GFR, Estimated: >60 mL/min (ref >60)
Glucose, Bld: 87 mg/dL (ref 70–99)
Potassium: 3.9 mmol/L (ref 3.5–5.1)
Sodium: 136 mmol/L (ref 135–145)
Total Bilirubin: 1 mg/dL (ref 0.3–1.2)
Total Protein: 7.6 g/dL (ref 6.5–8.1)

## 2021-11-29 LAB — VITAMIN D 25 HYDROXY (VIT D DEFICIENCY, FRACTURES): Vit D, 25-Hydroxy: 38.35 ng/mL (ref 30–100)

## 2021-11-30 ENCOUNTER — Encounter: Payer: Self-pay | Admitting: Physical Therapy

## 2021-11-30 ENCOUNTER — Ambulatory Visit: Payer: BC Managed Care – PPO | Admitting: Physical Therapy

## 2021-11-30 DIAGNOSIS — M79622 Pain in left upper arm: Secondary | ICD-10-CM

## 2021-11-30 LAB — CANCER ANTIGEN 27.29: CA 27.29: 23.1 U/mL (ref 0.0–38.6)

## 2021-11-30 NOTE — Therapy (Signed)
[place] [facility] [address], [place], [address] Phone: [phone]   Fax:  [phone]  Physical Therapy Treatment/Progress Note Reporting Period 10/19/21 - 11/30/21  Patient Details  Name: Denise [name] MRN: [id number] Date of Birth: June 30, 1975 No data recorded  Encounter Date: 11/30/2021   PT End of Session - 11/30/21 1454     Visit Number 10    Number of Visits 13    Date for PT Re-Evaluation 12/30/21    Authorization Type [employer]- 30VL per calendar year    Authorization Time Period 10/19/21-11/30/21    Authorization - Visit Number 10    Authorization - Number of Visits 30    Progress Note Due on Visit 10    PT Start Time 1430    PT Stop Time 1510    PT Time Calculation (min) 40 min    Activity Tolerance Patient tolerated treatment well;No increased pain    Behavior During Therapy WFL for tasks assessed/performed             Past Medical History:  Diagnosis Date   Abnormal breast biopsy    PASH, [name]   Breast cancer [facility]) 11/2017   left breast; ER/PR/Her2neu POS   Family history of breast cancer    Family history of ovarian cancer    Fibroadenoma of breast, left 2000, 2016   Genetic screening 08/26/2013   BRCA/BART negative/[facility], CHEK2 POS 2017   Hypertension    Increased risk of breast cancer 2017   IBIS=47%   Left breast lump 06/18/2017   Fluid/drained [name]   Migraine    Monoallelic mutation of CHEK2 gene in female patient 2017   increased risk of breast and colon cancer   Personal history of chemotherapy    Personal history of radiation therapy     Past Surgical History:  Procedure Laterality Date   BREAST BIOPSY Bilateral 09/08/14   neg   BREAST BIOPSY Left 03/16/2015   Procedure: BREAST BIOPSY WITH NEEDLE LOCALIZATION;  Surgeon: [name], MD;  Location: [facility];  Service: General;  Laterality: Left;   BREAST BIOPSY Left 11/2017   positive    BREAST CYST ASPIRATION Left 06/18/2017   cyst aspiration   BREAST EXCISIONAL BIOPSY Left 09/2014   BREAST EXCISIONAL BIOPSY Right    age 6's   BREAST RECONSTRUCTION WITH PLACEMENT OF TISSUE EXPANDER AND FLEX HD (ACELLULAR HYDRATED DERMIS) Left 05/20/2018   Procedure: BREAST RECONSTRUCTION WITH PLACEMENT OF TISSUE EXPANDER AND FLEX HD (ACELLULAR HYDRATED DERMIS);  Surgeon: [name], DO;  Location: [facility];  Service: Plastics;  Laterality: Left;   BREAST RECONSTRUCTION WITH PLACEMENT OF TISSUE EXPANDER AND FLEX HD (ACELLULAR HYDRATED DERMIS) Right 12/09/2018   Procedure: BREAST RECONSTRUCTION WITH PLACEMENT OF TISSUE EXPANDER AND FLEX HD (ACELLULAR HYDRATED DERMIS);  Surgeon: [name], DO;  Location: [facility];  Service: Plastics;  Laterality: Right;   BREAST SURGERY Right March 2000   benign fibroadenoma   BREAST SURGERY Left 08/08/13   excision   BREAST SURGERY Left 09/23/14   excision   COLONOSCOPY WITH PROPOFOL N/A 09/28/2021   Procedure: COLONOSCOPY WITH PROPOFOL;  Surgeon: [name], MD;  Location: [place];  Service: Endoscopy;  Laterality: N/A;   ESOPHAGOGASTRODUODENOSCOPY (EGD) WITH PROPOFOL N/A 01/20/2019   Procedure: ESOPHAGOGASTRODUODENOSCOPY (EGD) WITH PROPOFOL;  Surgeon: [name], MD;  Location: [place];  Service: Gastroenterology;  Laterality: N/A;   MASTECTOMY Left 05/20/2018   MASTECTOMY  W/ SENTINEL NODE BIOPSY Left 05/20/2018   Procedure: MASTECTOMY WITH SENTINEL LYMPH NODE BIOPSY;  Surgeon: [name], MD;  Location: [facility];  Service: General;  Laterality: Left;   PORT-A-CATH REMOVAL Right 03/31/2019   Procedure: REMOVAL PORT-A-CATH;  Surgeon: [name], DO;  Location: [facility];  Service: Plastics;  Laterality: Right;   PORTACATH PLACEMENT Right 12/17/2017   Procedure: INSERTION PORT-A-CATH;  Surgeon: [name], MD;  Location: [facility];  Service: General;  Laterality: Right;   REMOVAL OF BILATERAL  TISSUE EXPANDERS WITH PLACEMENT OF BILATERAL BREAST IMPLANTS Bilateral 03/31/2019   Procedure: REMOVAL OF BILATERAL TISSUE EXPANDERS WITH PLACEMENT OF BILATERAL BREAST IMPLANTS;  Surgeon: [name], DO;  Location: [facility];  Service: Plastics;  Laterality: Bilateral;  2 hours   SIMPLE MASTECTOMY WITH AXILLARY SENTINEL NODE BIOPSY Right 12/09/2018   Procedure: SIMPLE MASTECTOMY RIGHT;  Surgeon: [name], MD;  Location: [facility];  Service: General;  Laterality: Right;    There were no vitals filed for this visit.   Subjective Assessment - 11/30/21 1431     Subjective Pt reports she feels 60-70% better overall. She is able to do everything she wants, but has "pinching sensation" occassionally at random, occurs less than 10% of the time.    Pertinent History Denise [name] is a 18yoF referred to OPPT by plastic surgery s/p progressive tightness/tenderness in left chest/axillary region s/p breast reconstruction in October 2020 with implants placed after bilateral mastectomy and left radiation. She also reports an occasional "pinching" that began following radiation however has become more frequent as of lately. The pain associated is sharp and brief; pt unable to correlate to specific movements as it happens at rest and with a variety of activities. It does not affect her ability to sleep at night. Pt states her plastic surgeon believes the tightness may be causing her left breast to be pulled laterally.    Limitations House hold activities    How long can you sit comfortably? unlimited    How long can you stand comfortably? unlimited    How long can you walk comfortably? unlimited    Patient Stated Goals Decrease sharp pains    Pain Onset 1 to 4 weeks ago                 Manual therapy with pt in supine   STM to left pec major and L upper trap for 20 min for trigger point release to improve L shoulder mobility.      There.ex: Palloff anti rotation 5# x10 bilat; 10#  x10 bilat with good carry over of demo for technique  Farmers carry 15# 2x 52f; 20# x50 bilat with L lateral lean with R- observed patient carrying purse which she does in R shoulder with slight L lateral lean with this as well, education on being mindful of this posture with carrying items and driving; with slomo video education patient able to correct with purse 100%  Bucket handle breathing 168m for increased intercostal stretch with good carry over   Half moon with rotation stretch 30sec hold   Verbal HEP review of pec stretch, half moon stretch, and bucket handle breathing with understanding                       PT Education - 11/30/21 1454     Education Details reassessment; HEP    Person(s) Educated Patient    Methods Explanation;Demonstration;Verbal cues    Comprehension  Verbalized understanding;Returned demonstration;Verbal cues required              PT Short Term Goals - 11/30/21 1433       PT SHORT TERM GOAL #1   Title Pt will be independent with HEP in order to improve strength and motion, and to decrease pain in order to improve pain-free function at home and work.    Baseline Completes HEP without questions or concerns    Time 2    Period Weeks    Status Achieved               PT Long Term Goals - 11/30/21 1434       PT LONG TERM GOAL #1   Title Pt will decrease worst pain as reported on NPRS by at least 3 points in order to demonstrate clinically significant reduction in pain.    Baseline 10/19/21: 10/10; 11/30/21 7/10 short lived, and less frequent    Time 6    Period Weeks    Status Achieved      PT LONG TERM GOAL #2   Title Patient will increase FOTO score to equal to or greater than 74 to demonstrate statistically significant improvement in mobility and quality of life.    Baseline 10/19/21: 54; 11/30/20 79    Time 6    Period Weeks    Status Achieved      PT LONG TERM GOAL #3   Title Pt will report complete cessation of  "pinching" pains in order to function pain-free at home, work and community without momentary pause due to brief yet severe pain.    Baseline 10/19/21: has become more frequent over the couple months; 11/30/21 less than 10% of the time, brief    Time 6    Period Weeks                   Plan - 11/30/21 1531     Clinical Impression Statement PT reassessed goals today per 10th visit compliance. patient has made excellent progress torward all goals, with remaining issue with "pinching" sensation, pinpointed to intercostals of approx ribs 5-6 inhibiting full participation in ADLs. Patient is able to comply with all cuing for proper technique of therex with understanding of HEP. PT educated patietn on preventing L lateral trunk lean with carrying, driving, and active ADLs with excellent understanding. PT will continue progression as able over 4 more weeks with frequency of 1x/week to promote indpendence toward accomplishing goals.    Personal Factors and Comorbidities Age;Past/Current Experience;Comorbidity 1    Comorbidities History of CA    Examination-Activity Limitations Other    Examination-Participation Restrictions Community Activity    Stability/Clinical Decision Making Evolving/Moderate complexity    Clinical Decision Making Moderate    Rehab Potential Good    Clinical Impairments Affecting Rehab Potential (+) age, motivation, social support, (-) current sedentary lifestyle, ongoing cancer treatment, other comorbidities    PT Frequency 2x / week    PT Duration 6 weeks    PT Treatment/Interventions ADLs/Self Care Home Management;Aquatic Therapy;Electrical Stimulation;Cryotherapy;Moist Heat;Iontophoresis 64m/ml Dexamethasone;Functional mobility training;Therapeutic activities;Therapeutic exercise;Traction;Ultrasound;Neuromuscular re-education;Manual techniques;Patient/family education;Passive range of motion;Dry needling;Energy conservation;Taping;Manual lymph drainage    PT Next Visit  Plan intercostal release work    PT Home Exercise Plan pec stretch (doorway), W hold at wall, supine chest stretch on foam roll    Consulted and Agree with Plan of Care Patient             Patient will benefit from  skilled therapeutic intervention in order to improve the following deficits and impairments:  Increased fascial restricitons, Impaired perceived functional ability, Impaired UE functional use, Pain, Impaired flexibility  Visit Diagnosis: Pain in left upper arm     Problem List Patient Active Problem List   Diagnosis Date Noted   Iron deficiency anemia due to chronic blood loss 02/14/2019   Melena    Epistaxis 01/08/2019   Acquired absence of right breast 12/17/2018   Admission for breast reconstruction following mastectomy 12/09/2018   Use of tamoxifen (Nolvadex) 12/04/2018   Acquired absence of left breast 09/03/2018   Visual changes 08/20/2018   Breast asymmetry following reconstructive surgery 08/13/2018   Hot flashes due to menopause 06/18/2018   Status post left mastectomy 06/04/2018   S/P breast reconstruction, bilateral 05/28/2018   Vasomotor symptoms due to menopause 04/17/2018   Elevated LFTs 03/17/2018   Goals of care, counseling/discussion 03/17/2018   Hypomagnesemia 03/04/2018   Diarrhea 01/02/2018   Encounter for antineoplastic chemotherapy 12/21/2017   Encounter for antineoplastic immunotherapy 12/21/2017   Carrier of high risk cancer gene mutation 08/06/2017   Breast mass, right 10/42/4731   Monoallelic mutation of CHEK2 gene in female patient 06/20/2015   Carcinoma of upper-outer quadrant of left breast in female, estrogen receptor positive ([place]) 09/17/2014   Denise [name] DPT Denise [name], PT 11/30/2021, 3:39 PM  [place] [facility] [address] [place], [address] Phone: [phone]   Fax:  [phone]  Name: Denise [name] MRN: [id number] Date of Birth:  04/05/1976

## 2021-12-01 ENCOUNTER — Encounter: Payer: Self-pay | Admitting: Internal Medicine

## 2021-12-01 ENCOUNTER — Inpatient Hospital Stay: Payer: BC Managed Care – PPO

## 2021-12-01 ENCOUNTER — Inpatient Hospital Stay: Payer: BC Managed Care – PPO | Admitting: Internal Medicine

## 2021-12-01 VITALS — BP 133/99 | HR 79 | Temp 98.3°F | Ht 66.0 in | Wt 172.6 lb

## 2021-12-01 DIAGNOSIS — C50412 Malignant neoplasm of upper-outer quadrant of left female breast: Secondary | ICD-10-CM | POA: Diagnosis not present

## 2021-12-01 DIAGNOSIS — Z170 Estrogen receptor positive status [ER+]: Secondary | ICD-10-CM | POA: Diagnosis not present

## 2021-12-01 MED ORDER — ZOLEDRONIC ACID 4 MG/100ML IV SOLN
4.0000 mg | Freq: Once | INTRAVENOUS | Status: AC
  Administered 2021-12-01: 4 mg via INTRAVENOUS
  Filled 2021-12-01: qty 100

## 2021-12-01 MED ORDER — SODIUM CHLORIDE 0.9 % IV SOLN
Freq: Once | INTRAVENOUS | Status: AC
  Filled 2021-12-01: qty 250

## 2021-12-01 NOTE — Assessment & Plan Note (Addendum)
#  Stage IB multifocal LEFT breast cancer ER-positive; Her2 positive- on Tamoxifen.  S/p left mastectomy; right prophylactic mastectomy-clinically STABLE. July 2022 CT scan chest and pelvis negative for any metastatic disease; bilateral mastectomies and mild scarring noted in the left upper lobe/radiation-induced likely.   #Patient continues to be on adjuvant tamoxifen.  No concerns for any recurrence. Continue Zometa adjuvantly every 6 months x3 years. [#1 on Nov 14th, 2022].   #Tumor marker: Within normal limits; however up-and-down.  Informed the patient that this is unlikely to be concerning.  Would recommend holding off on imaging unless significantly elevated; or an abnormal trend noted.  Will recommend tumor marker checking in 3 months.  # Hot flashes-G-1-2; patient previously on Effexor.  On  effexor 37.5 mg. STABLE.  # Hot flashes: G-1 [sec to summer]-STABLE.  Hold off any pharmacotherapy.  #  Premenopausal [July 9381]-OFBPZW ultrasound[IUD-cystic changes in the endometrium] in place; yearly follow-up with gynecology; awaiting   # CHEK- 2 mutations: discussed re: colonoscopy starting 40 years;/sp  April 11th- colonoscopy- NEG [Dr.Byrnett]-reviewed normal.  #Screening for osteoporosis-JUNE 2023- The BMDT-score of 0.0.  Normal continue calcium plus vitamin D [? 6000 u].   my chart # DISPOSITION: # in 3 months- lab- ca-27-29 [ordered] # follow up in 6 months- MD; 3 days prior-labs-cbc/cmp/ca-27-29;Vit D 25OH levels; Zometa ;  priorDr.B

## 2021-12-01 NOTE — Progress Notes (Signed)
Denise [name]  Patient Care Team: Venetia Maxon, Rolanda Jay, PA-C as PCP - General (Family Medicine) Bary Castilla, Forest Gleason, MD (General Surgery) Gae Dry, MD as Referring Physician (Obstetrics and Gynecology) Lequita Asal, MD (Inactive) as Referring Physician (Hematology and Oncology) Rico Junker, RN as Oncology Nurse Navigator Noreene Filbert, MD as Referring Physician (Radiation Oncology) [place] Pink, MD as Consulting Physician (Family Medicine) Cammie Sickle, MD as Consulting Physician (Oncology)  CHIEF COMPLAINTS/PURPOSE OF CONSULTATION: Breast cancer  #  Oncology History Overview [name]  Denise [name] is a 46 y.o. 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  Pathology revealed no residual carcinoma in the breast.  One of three sentinel lymph nodes were positive.  One lymph node had 2 metastatic foci (2.1 mm and 1 mm).  Pathologic stage revealed ypT0 ypN1a.   Index breast mass and axillary node biopsy on 12/06/2017 revealed grade II invasive ductal carcinoma with calcifications at the 11 o'clock position.  There was metastatic carcinoma in 1 of 1 lymph nodes.  Tumor was ER + (100%), PR + (100%), and Ki67 5%.  Her2/neu was 3+ by IHC (heterogeneous) and FISH +.   Diagnostic left mammogram and ultrasound on 12/06/2017 revealed a 1.7 x 1.3 x 1.3 cm irregular hypoechoic mass with internal calcifications at the 11 o'clock position 2 cm from the nipple with internal calcifications. There was a similar appearing 0.9 x 0.8 x 0.7 cm mass at the 11 o'clock position 4 cm from the nipple (2.4 cm from the index lesion).  There were suspicious, segmental calcifications originating from the index lesion and extending 5 cm posteriorly.  There was a 0.6 cm morphologically abnormal left axillary lymph node.   Bilateral breast MRI on  12/10/2017 revealed a papilloma in the right breast  The recently biopsied malignancy in the left breast (15 x 16 x 16 mm) was identified. The adjacent and more superiorly located 11 o'clock mass (7 x 12 mm) seen on recent ultrasound, 4 cm from the nipple on ultrasound, was also identified. There were 2 probable satellite lesions (7 mm, medial lesion; posterior and lateral lesion). The total span of malignancy was 3 x 3.1 x 4.5 cm.  There were calcifications extending up to 5 cm posterior to the biopsied malignancy which were highly suspicious on mammography but not appreciated.  There was a mass in the lower outer left breast measured 5 mm, representing a change.  The known metastatic node in the left axilla was identified. A node along the posterolateral margin of the pectoralis minor (cortex 5 mm) was nonspecific but at least somewhat suspicious.   Chest, abdomen, and pelvic CT on 12/19/2017 revealed a solitary enlarged biopsy-proven metastatic left axillary lymph node.  There were no additional findings suspicious for metastatic disease in the chest, abdomen or pelvis.  There was a nonspecific tiny sclerotic upper right sacral lesion, more likely a benign bone island. Bone scintigraphy correlation versus follow-up CT could be considered.   Bone scan on 01/11/2018 was negative for metastatic pattern. No areas of focal tracer uptake. Area of concern in sacrum on CT likely represents benign bone island.    Myriad genetic testing on 08/26/2013 was negative for BRCA1 and 2. 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  She has a family history significant for breast, ovarian, pancreatic, and prostate cancer.   She received 6 cycles  of TCHP chemotherapy (12/21/2017 - 04/17/2018) with Margarette Canada support. Cycle #3 was held due to elevated LFTs on 02/04/2018. She received Herceptin + Perjeta alone (04/30/2018 - 05/30/2018).  She is s/p cycle #1 Kadcyla (06/20/2018).   She began breast  radiation on 06/25/2018   Echocardiograms:  12/20/2017 - EF of 60-65%, 03/27/2018 - EF of 60-65%, and 06/18/2018 - EF of 55-60%.   She has a history of elevated LFTs.  Hepatitis B and C serologies were negative on 02/04/2018.  RUQ ultrasound on 02/06/2018 revealed a small anterior gallbladder wall polyp. There was no evidence of cholelithiasis or cholecystis. CBD measured normal at 2 mm, with no evidence of choledocholithiasis. There was normal direction of blood flow towards the liver noted.   Carcinoma of upper-outer quadrant of left breast in female, estrogen receptor positive (Denise [name])  09/17/2014 Initial Diagnosis   Malignant neoplasm of upper-outer quadrant of female breast (Denise [name])   12/21/2017 - 04/18/2018 Chemotherapy   The patient had dexamethasone (DECADRON) 4 MG tablet, 8 mg, Oral, 2 times daily, 2 of 2 cycles palonosetron (ALOXI) injection 0.25 mg, 0.25 mg, Intravenous,  Once, 6 of 6 cycles Administration: 0.25 mg (12/21/2017), 0.25 mg (01/14/2018), 0.25 mg (03/28/2018), 0.25 mg (04/17/2018), 0.25 mg (02/11/2018), 0.25 mg (03/07/2018) pegfilgrastim (NEULASTA ONPRO KIT) injection 6 mg, 6 mg, Subcutaneous, Once, 1 of 1 cycle Administration: 6 mg (12/21/2017) pegfilgrastim-cbqv (UDENYCA) injection 6 mg, 6 mg, Subcutaneous, Once, 6 of 6 cycles Administration: 6 mg (01/15/2018), 6 mg (03/29/2018), 6 mg (04/18/2018), 6 mg (02/12/2018), 6 mg (03/08/2018) trastuzumab (HERCEPTIN) 600 mg in sodium chloride 0.9 % 250 mL chemo infusion, 672 mg, Intravenous,  Once, 6 of 6 cycles Administration: 600 mg (12/21/2017), 450 mg (01/14/2018), 500 mg (03/28/2018), 500 mg (04/17/2018), 500 mg (02/11/2018), 500 mg (03/07/2018) CARBOplatin (PARAPLATIN) 870 mg in sodium chloride 0.9 % 500 mL chemo infusion, 890 mg (100 % of original dose 885 mg), Intravenous,  Once, 6 of 6 cycles Dose modification:   (original dose 885 mg, Cycle 1),   (original dose 885 mg, Cycle 2),   (original dose 885 mg, Cycle 5),   (original dose 885 mg, Cycle  6),   (original dose 885 mg, Cycle 3),   (original dose 885 mg, Cycle 4) Administration: 870 mg (12/21/2017), 870 mg (01/14/2018), 870 mg (03/28/2018), 870 mg (04/17/2018), 870 mg (02/11/2018), 870 mg (03/07/2018) DOCEtaxel (TAXOTERE) 150 mg in sodium chloride 0.9 % 250 mL chemo infusion, 75 mg/m2 = 150 mg, Intravenous,  Once, 6 of 6 cycles Dose modification: 55 mg/m2 (original dose 75 mg/m2, Cycle 5, Reason: Change in LFTs, Comment: per pharmacy) Administration: 150 mg (12/21/2017), 150 mg (01/14/2018), 110 mg (03/28/2018), 150 mg (04/17/2018), 150 mg (02/11/2018), 150 mg (03/07/2018) pertuzumab (PERJETA) 840 mg in sodium chloride 0.9 % 250 mL chemo infusion, 840 mg, Intravenous, Once, 6 of 6 cycles Administration: 840 mg (12/21/2017), 420 mg (01/14/2018), 420 mg (03/28/2018), 420 mg (04/17/2018), 420 mg (02/11/2018), 420 mg (03/07/2018) fosaprepitant (EMEND) 150 mg, dexamethasone (DECADRON) 12 mg in sodium chloride 0.9 % 145 mL IVPB, , Intravenous,  Once, 6 of 6 cycles Administration:  (12/21/2017),  (01/14/2018),  (03/28/2018),  (04/17/2018),  (02/11/2018),  (03/07/2018)  for chemotherapy treatment.    05/09/2018 - 05/30/2018 Chemotherapy   The patient had trastuzumab (HERCEPTIN) 500 mg in sodium chloride 0.9 % 250 mL chemo infusion, 504 mg, Intravenous,  Once, 2 of 11 cycles Administration: 500 mg (05/30/2018), 500 mg (05/09/2018) pertuzumab (PERJETA) 420 mg in sodium chloride 0.9 % 250 mL chemo infusion, 420  mg, Intravenous, Once, 2 of 11 cycles Administration: 420 mg (05/30/2018), 420 mg (05/09/2018)  for chemotherapy treatment.    01/17/2019 - 02/14/2019 Chemotherapy   Patient is on Treatment Plan : BREAST ([place]i) Trastuzumab q21d       HISTORY OF PRESENTING ILLNESS: Ambulating independently.  Alone.  Denise [name] 46 y.o.  female with a history of stage Ib ER/PR positive HER2/neu positive breast cancer currently on tamoxifen is here for follow-up/review results of tumor marker.  Complains of  intermittent hot flashes.  Improved on Effexor. Patient denies any new onset of bone pain or joint pains.   Not any worse.  No shortness of breath or cough.  Review of Systems  Constitutional:  Negative for chills, diaphoresis, fever, malaise/fatigue and weight loss.  HENT:  Negative for nosebleeds and sore throat.   Eyes:  Negative for double vision.  Respiratory:  Negative for cough, hemoptysis, sputum production, shortness of breath and wheezing.   Cardiovascular:  Negative for chest pain, palpitations, orthopnea and leg swelling.  Gastrointestinal:  Negative for abdominal pain, blood in stool, constipation, diarrhea, heartburn, melena, nausea and vomiting.  Genitourinary:  Negative for dysuria, frequency and urgency.  Musculoskeletal:  Negative for back pain and joint pain.  Skin: Negative.  Negative for itching and rash.  Neurological:  Negative for dizziness, tingling, focal weakness, weakness and headaches.  Endo/Heme/Allergies:  Does not bruise/bleed easily.  Psychiatric/Behavioral:  Negative for depression. The patient is not nervous/anxious and does not have insomnia.      MEDICAL HISTORY:  Past Medical History:  Diagnosis Date  . Abnormal breast biopsy    [facility], Dr. Lemar Livings  . Breast cancer (HCC) 11/2017   left breast; ER/PR/Her2neu POS  . Family history of breast cancer   . Family history of ovarian cancer   . Fibroadenoma of breast, left 2000, 2016  . Genetic screening 08/26/2013   BRCA/BART negative/Myriad, CHEK2 POS 2017  . Hypertension   . Increased risk of breast cancer 2017   IBIS=47%  . Left breast lump 06/18/2017   Fluid/drained [name]  . Migraine   . Monoallelic mutation of CHEK2 gene in female patient 2017   increased risk of breast and colon cancer  . Personal history of chemotherapy   . Personal history of radiation therapy     SURGICAL HISTORY: Past Surgical History:  Procedure Laterality Date  . BREAST BIOPSY Bilateral 09/08/14   neg  . BREAST  BIOPSY Left 03/16/2015   Procedure: BREAST BIOPSY WITH NEEDLE LOCALIZATION;  Surgeon: Earline Mayotte, MD;  Location: [facility] ORS;  Service: General;  Laterality: Left;  . BREAST BIOPSY Left 11/2017   positive  . BREAST CYST ASPIRATION Left 06/18/2017   cyst aspiration  . BREAST EXCISIONAL BIOPSY Left 09/2014  . BREAST EXCISIONAL BIOPSY Right    age 46's  . BREAST RECONSTRUCTION WITH PLACEMENT OF TISSUE EXPANDER AND FLEX HD (ACELLULAR HYDRATED DERMIS) Left 05/20/2018   Procedure: BREAST RECONSTRUCTION WITH PLACEMENT OF TISSUE EXPANDER AND FLEX HD (ACELLULAR HYDRATED DERMIS);  Surgeon: Peggye Form, DO;  Location: [facility];  Service: Plastics;  Laterality: Left;  . BREAST RECONSTRUCTION WITH PLACEMENT OF TISSUE EXPANDER AND FLEX HD (ACELLULAR HYDRATED DERMIS) Right 12/09/2018   Procedure: BREAST RECONSTRUCTION WITH PLACEMENT OF TISSUE EXPANDER AND FLEX HD (ACELLULAR HYDRATED DERMIS);  Surgeon: Peggye Form, DO;  Location: [facility];  Service: Plastics;  Laterality: Right;  . BREAST SURGERY Right March 2000   benign fibroadenoma  . BREAST SURGERY Left 08/08/13  excision  . BREAST SURGERY Left 09/23/14   excision  . COLONOSCOPY WITH PROPOFOL N/A 09/28/2021   Procedure: COLONOSCOPY WITH PROPOFOL;  Surgeon: [name], MD;  Location: [facility];  Service: Endoscopy;  Laterality: N/A;  . ESOPHAGOGASTRODUODENOSCOPY (EGD) WITH PROPOFOL N/A 01/20/2019   Procedure: ESOPHAGOGASTRODUODENOSCOPY (EGD) WITH PROPOFOL;  Surgeon: [name], MD;  Location: [facility] ENDOSCOPY;  Service: Gastroenterology;  Laterality: N/A;  . MASTECTOMY Left 05/20/2018  . MASTECTOMY W/ SENTINEL NODE BIOPSY Left 05/20/2018   Procedure: MASTECTOMY WITH SENTINEL LYMPH NODE BIOPSY;  Surgeon: [name], MD;  Location: [facility];  Service: General;  Laterality: Left;  . PORT-A-CATH REMOVAL Right 03/31/2019   Procedure: REMOVAL PORT-A-CATH;  Surgeon: [name], DO;  Location: [facility];   Service: Plastics;  Laterality: Right;  . PORTACATH PLACEMENT Right 12/17/2017   Procedure: INSERTION PORT-A-CATH;  Surgeon: [name], MD;  Location: [facility];  Service: General;  Laterality: Right;  . REMOVAL OF BILATERAL TISSUE EXPANDERS WITH PLACEMENT OF BILATERAL BREAST IMPLANTS Bilateral 03/31/2019   Procedure: REMOVAL OF BILATERAL TISSUE EXPANDERS WITH PLACEMENT OF BILATERAL BREAST IMPLANTS;  Surgeon: [name], DO;  Location: [facility];  Service: Plastics;  Laterality: Bilateral;  2 hours  . SIMPLE MASTECTOMY WITH AXILLARY SENTINEL NODE BIOPSY Right 12/09/2018   Procedure: SIMPLE MASTECTOMY RIGHT;  Surgeon: [name], MD;  Location: [facility];  Service: General;  Laterality: Right;    SOCIAL HISTORY: Social History   Socioeconomic History  . Marital status: Married    Spouse name: Not on file  . Number of children: Not on file  . Years of education: Not on file  . Highest education level: Not on file  Occupational History  . Not on file  Tobacco Use  . Smoking status: Never  . Smokeless tobacco: Never  Vaping Use  . Vaping Use: Never used  Substance and Sexual Activity  . Alcohol use: Yes    Comment: occasionally  . Drug use: No  . Sexual activity: Yes    Birth control/protection: I.U.D.    Comment: Paragard  Other Topics Concern  . Not on file  Social History Narrative  . Not on file   Social Determinants of Health   Financial Resource Strain: Low Risk  (01/08/2019)   Overall Financial Resource Strain (CARDIA)   . Difficulty of Paying Living Expenses: Not very hard  Food Insecurity: No Food Insecurity (01/08/2019)   Hunger Vital Sign   . Worried About Charity fundraiser in the Last Year: Never true   . Ran Out of Food in the Last Year: Never true  Transportation Needs: Unknown (01/08/2019)   [place] - Transportation   . Lack of Transportation (Medical): No   . Lack of Transportation (Non-Medical): Not on file  Physical Activity:  Sufficiently Active (08/06/2017)   Exercise Vital Sign   . Days of Exercise per Week: 3 days   . Minutes of Exercise per Session: 60 min  Stress: No Stress Concern Present (08/06/2017)   [place]   . Feeling of Stress : Only a little  Social Connections: Moderately Integrated (08/06/2017)   Social Connection and Isolation Panel [NHANES]   . Frequency of Communication with Friends and Family: Twice a week   . Frequency of Social Gatherings with Friends and Family: Once a week   . Attends Religious Services: More than 4 times per year   . Active Member of Clubs or Organizations:  No   . Attends Club or Organization Meetings: Never   . Marital Status: Married  Human resources officer Violence: Not At Risk (08/06/2017)   Humiliation, Afraid, Rape, and Kick questionnaire   . Fear of Current or Ex-Partner: No   . Emotionally Abused: No   . Physically Abused: No   . Sexually Abused: No    FAMILY HISTORY: Family History  Problem Relation Age of Onset  . Skin cancer Mother   . Prostate cancer Father 22       requiring tx/hormones  . Depression Sister   . Breast cancer Paternal Grandmother 53  . Breast cancer Maternal Aunt 60  . Ovarian cancer Maternal Aunt 70  . Pancreatic cancer Maternal Aunt 75  . Breast cancer Paternal Aunt 33  . Stroke Maternal Grandmother   . Stroke Maternal Grandfather   . Alcohol abuse Paternal Grandfather   . Prostate cancer Maternal Uncle   . Alcohol abuse Paternal Uncle   . Anxiety disorder Paternal Uncle     ALLERGIES:  is allergic to steri-strip compound benzoin [benzoin compound], meloxicam, silicone, and tape.  MEDICATIONS:  Current Outpatient Medications  Medication Sig Dispense Refill  . acetaminophen (TYLENOL) 500 MG tablet Take 1,000 mg by mouth every 6 (six) hours as needed for moderate pain or headache.     . cholecalciferol (VITAMIN D3) 25 MCG (1000 UNIT) tablet Take 3,000 Units by  mouth daily.    Marland Kitchen loratadine (CLARITIN) 10 MG tablet Take 10 mg by mouth daily.    . tamoxifen (NOLVADEX) 20 MG tablet Take 1 tablet (20 mg total) by mouth daily. 90 tablet 1  . venlafaxine XR (EFFEXOR-XR) 37.5 MG 24 hr capsule Take 1 capsule (37.5 mg total) by mouth daily with breakfast. 90 capsule 3  . vitamin C (ASCORBIC ACID) 500 MG tablet Take 500 mg by mouth daily.    Marland Kitchen omeprazole (PRILOSEC) 20 MG capsule Take 20 mg by mouth daily as needed. (Patient not taking: Reported on 12/01/2021)     Current Facility-Administered Medications  Medication Dose Route Frequency Provider Last Rate Last Admin  . paragard intrauterine copper IUD   Intrauterine Once Copland, Alicia B, PA-C       Facility-Administered Medications Ordered in Other Visits  Medication Dose Route Frequency Provider Last Rate Last Admin  . sodium chloride flush (NS) 0.9 % injection 10 mL  10 mL Intravenous PRN Lequita Asal, MD   10 mL at 04/17/18 0844      .  PHYSICAL EXAMINATION: ECOG PERFORMANCE STATUS: 0 - Asymptomatic  Vitals:   12/01/21 1100  BP: (!) 133/99  Pulse: 79  Temp: 98.3 F (36.8 C)  SpO2: 100%   Filed Weights   12/01/21 1100  Weight: 172 lb 9.6 oz (78.3 kg)    Physical Exam Vitals and nursing [name] reviewed.  Constitutional:      Comments: Alone; ambulating independently  HENT:     Head: Normocephalic and atraumatic.     Mouth/Throat:     Mouth: Mucous membranes are moist.     Pharynx: Oropharynx is clear. No oropharyngeal exudate.  Eyes:     Extraocular Movements: Extraocular movements intact.     Pupils: Pupils are equal, round, and reactive to light.  Cardiovascular:     Rate and Rhythm: Normal rate and regular rhythm.  Pulmonary:     Effort: No respiratory distress.     Breath sounds: No wheezing.  Abdominal:     General: Bowel sounds are normal. There is no  distension.     Palpations: Abdomen is soft. There is no mass.     Tenderness: There is no abdominal tenderness.  There is no guarding or rebound.  Musculoskeletal:        General: No tenderness. Normal range of motion.     Cervical back: Normal range of motion and neck supple.  Skin:    General: Skin is warm.  Neurological:     General: No focal deficit present.     Mental Status: She is alert and oriented to person, place, and time.  Psychiatric:        Mood and Affect: Affect normal.        Behavior: Behavior normal.        Judgment: Judgment normal.    Latest Reference Range & Units Most Recent 05/09/19 09:50 08/12/19 08:18 12/04/19 13:10 04/08/20 13:51 08/05/20 13:29 01/04/21 10:05 04/22/21 09:58 08/19/21 10:45  CA 27.29 0.0 - 38.6 U/mL 22.9 08/19/21 10:45 21.9 19.9 18.3 16.8 17.7 22.9 19.7 22.9    LABORATORY DATA:  I have reviewed the data as listed Lab Results  Component Value Date   WBC 8.0 11/29/2021   HGB 12.8 11/29/2021   HCT 38.4 11/29/2021   MCV 88.3 11/29/2021   PLT 228 11/29/2021   Recent Labs    04/22/21 0958 08/19/21 1045 11/29/21 1135  NA 139 137 136  K 3.7 3.8 3.9  CL 103 102 101  CO2 $Re'26 28 26  'LTP$ GLUCOSE 93 110* 87  BUN $Re'10 14 12  'jOb$ CREATININE 0.69 0.65 0.74  CALCIUM 9.2 9.1 8.7*  GFRNONAA >60 >60 >60  PROT 7.4 7.6 7.6  ALBUMIN 4.4 4.2 4.3  AST $Re'22 23 22  'msM$ ALT $R'20 19 19  'DN$ ALKPHOS 43 28* 32*  BILITOT 1.1 0.7 1.0    RADIOGRAPHIC STUDIES: I have personally reviewed the radiological images as listed and agreed with the findings in the report. DG Bone Density  Result Date: 11/22/2021 EXAM: DUAL X-RAY ABSORPTIOMETRY (DXA) FOR BONE MINERAL DENSITY IMPRESSION: Your patient Denise [name] completed a BMD test on 11/22/2021 using the [place] (software version: 14.10) manufactured by [employer]. The following summarizes the results of our evaluation. Technologist: ECJ PATIENT BIOGRAPHICAL: Name: Denise [name], Denise [name] Patient ID: [id number] Birth Date: 02/28/1976 Height: 66.0 in. Gender: Female Exam Date: 11/22/2021 Weight: 165.2 lbs. Indications: Breast  CA, Caucasian, History of Breast Cancer, History of Chemo, History of Radiation, Premenopausal Fractures: Treatments: Tamoxifen, Vitamin D DENSITOMETRY RESULTS: Site      Region      Measured Date Measured Age WHO Classification Young Adult T-score BMD         %Change vs. Previous Significant Change (*) AP Spine L1-L4 11[age]22/2023 44.4 Normal 1.8 1.418 g/cm2 - - DualFemur Total Right 11/22/2021 44.4 Normal 0.0 1.003 g/cm2 - - ASSESSMENT: The BMD measured at Femur Total Right is 1.003 g/cm2 with a T-score of 0.0. This patient is considered normal according to [facility]) criteria. The scan quality is good. World [facility]) criteria for post-menopausal, Caucasian Women: Normal:                   T-score at or above -1 SD Osteopenia/low bone mass: T-score between -1 and -2.5 SD Osteoporosis:             T-score at or below -2.5 SD RECOMMENDATIONS: 1. All patients should optimize calcium and vitamin D intake. 2. Consider FDA-approved medical therapies in postmenopausal women and men aged  59 years and older, based on the following: a. A hip or vertebral(clinical or morphometric) fracture b. T-score < -2.5 at the femoral neck or spine after appropriate evaluation to exclude secondary causes c. Low bone mass (T-score between -1.0 and -2.5 at the femoral neck or spine) and a 10-year probability of a hip fracture > 3% or a 10-year probability of a major osteoporosis-related fracture > 20% based on the [place]-adapted WHO algorithm 3. Clinician judgment and/or patient preferences may indicate treatment for people with 10-year fracture probabilities above or below these levels FOLLOW-UP: People with diagnosed cases of osteoporosis or at high risk for fracture should have regular bone mineral density tests. For patients eligible for Medicare, routine testing is allowed once every 2 years. The testing frequency can be increased to one year for patients who have rapidly progressing disease, those who  are receiving or discontinuing medical therapy to restore bone mass, or have additional risk factors. I have reviewed this report, and agree with the above findings. [facility] Electronically Signed   By: Ammie Ferrier M.D.   On: 11/22/2021 10:38    ASSESSMENT & PLAN:   Carcinoma of upper-outer quadrant of left breast in female, estrogen receptor positive ([place]) # Stage IB multifocal LEFT breast cancer ER-positive; Her2 positive- on Tamoxifen.  S/p left mastectomy; right prophylactic mastectomy-clinically STABLE. July 2022 CT scan chest and pelvis negative for any metastatic disease; bilateral mastectomies and mild scarring noted in the left upper lobe/radiation-induced likely.   #Patient continues to be on adjuvant tamoxifen.  No concerns for any recurrence. Continue Zometa adjuvantly every 6 months x3 years. [#1 on Nov 14th, 2022].   #Tumor marker: Within normal limits; however up-and-down.  Informed the patient that this is unlikely to be concerning.  Would recommend holding off on imaging unless significantly elevated; or an abnormal trend noted.  Will recommend tumor marker checking in 3 months.  # Hot flashes-G-1-2; patient previously on Effexor.  On  effexor 37.5 mg. STABLE.  # Hot flashes: G-1 [sec to summer]-STABLE.  Hold off any pharmacotherapy.  #  Premenopausal [July 2023]-XIDHWY ultrasound[IUD-cystic changes in the endometrium] in place; yearly follow-up with gynecology; awaiting   # CHEK- 2 mutations: discussed re: colonoscopy starting 40 years;/sp  April 11th- colonoscopy- NEG [Dr.Byrnett]-reviewed normal.  #Screening for osteoporosis-JUNE 2023- The BMDT-score of 0.0.  Normal continue calcium plus vitamin D [? 6000 u].   my chart # DISPOSITION: # in 3 months- lab- ca-27-29 [ordered] # follow up in 6 months- MD; 3 days prior-labs-cbc/cmp/ca-27-29;Vit D 25OH levels; Zometa ;  priorDr.B      All questions were answered. The patient knows to call the clinic  with any problems, questions or concerns.       Cammie Sickle, MD 12/01/2021 4:16 PM

## 2021-12-01 NOTE — Progress Notes (Signed)
C/o head cold since Monday. No fever.

## 2021-12-07 ENCOUNTER — Encounter: Payer: Self-pay | Admitting: Physical Therapy

## 2021-12-07 ENCOUNTER — Ambulatory Visit: Payer: BC Managed Care – PPO

## 2021-12-07 DIAGNOSIS — M79622 Pain in left upper arm: Secondary | ICD-10-CM | POA: Diagnosis not present

## 2021-12-07 NOTE — Therapy (Signed)
[facility] [address] [place], [address] Phone: [phone]   Fax:  [phone]  Physical Therapy Treatment  Patient Details  Name: Denise [name] MRN: [id number] Date of Birth: 1976-05-11 No data recorded  Encounter Date: 12/07/2021   PT End of Session - 12/07/21 0831     Visit Number 11    Number of Visits 13    Date for PT Re-Evaluation 12/30/21    Authorization Type [employer] State Employee- 30VL per calendar year    Authorization Time Period 10/19/21-11/30/21    Authorization - Visit Number 11    Authorization - Number of Visits 30    Progress Note Due on Visit 10    PT Start Time 0831    PT Stop Time 0912    PT Time Calculation (min) 41 min    Activity Tolerance Patient tolerated treatment well;No increased pain    Behavior During Therapy WFL for tasks assessed/performed             Past Medical History:  Diagnosis Date   Abnormal breast biopsy    PASH, [name]   Breast cancer [facility]) 11/2017   left breast; ER/PR/Her2neu POS   Family history of breast cancer    Family history of ovarian cancer    Fibroadenoma of breast, left 2000, 2016   Genetic screening 08/26/2013   BRCA/BART negative/Myriad, CHEK2 POS 2017   Hypertension    Increased risk of breast cancer 2017   IBIS=47%   Left breast lump 06/18/2017   Fluid/drained [name]   Migraine    Monoallelic mutation of CHEK2 gene in female patient 2017   increased risk of breast and colon cancer   Personal history of chemotherapy    Personal history of radiation therapy     Past Surgical History:  Procedure Laterality Date   BREAST BIOPSY Bilateral 09/08/14   neg   BREAST BIOPSY Left 03/16/2015   Procedure: BREAST BIOPSY WITH NEEDLE LOCALIZATION;  Surgeon: [name], MD;  Location: [facility];  Service: General;  Laterality: Left;   BREAST BIOPSY Left 11/2017   positive   BREAST CYST ASPIRATION Left 06/18/2017   cyst  aspiration   BREAST EXCISIONAL BIOPSY Left 09/2014   BREAST EXCISIONAL BIOPSY Right    age 55's   BREAST RECONSTRUCTION WITH PLACEMENT OF TISSUE EXPANDER AND FLEX HD (ACELLULAR HYDRATED DERMIS) Left 05/20/2018   Procedure: BREAST RECONSTRUCTION WITH PLACEMENT OF TISSUE EXPANDER AND FLEX HD (ACELLULAR HYDRATED DERMIS);  Surgeon: [name], DO;  Location: [facility];  Service: Plastics;  Laterality: Left;   BREAST RECONSTRUCTION WITH PLACEMENT OF TISSUE EXPANDER AND FLEX HD (ACELLULAR HYDRATED DERMIS) Right 12/09/2018   Procedure: BREAST RECONSTRUCTION WITH PLACEMENT OF TISSUE EXPANDER AND FLEX HD (ACELLULAR HYDRATED DERMIS);  Surgeon: [name], DO;  Location: [facility];  Service: Plastics;  Laterality: Right;   BREAST SURGERY Right March 2000   benign fibroadenoma   BREAST SURGERY Left 08/08/13   excision   BREAST SURGERY Left 09/23/14   excision   COLONOSCOPY WITH PROPOFOL N/A 09/28/2021   Procedure: COLONOSCOPY WITH PROPOFOL;  Surgeon: [name], MD;  Location: [place];  Service: Endoscopy;  Laterality: N/A;   ESOPHAGOGASTRODUODENOSCOPY (EGD) WITH PROPOFOL N/A 01/20/2019   Procedure: ESOPHAGOGASTRODUODENOSCOPY (EGD) WITH PROPOFOL;  Surgeon: [name], MD;  Location: [place];  Service: Gastroenterology;  Laterality: N/A;   MASTECTOMY Left 05/20/2018   MASTECTOMY W/ SENTINEL NODE BIOPSY Left 05/20/2018  Procedure: MASTECTOMY WITH SENTINEL LYMPH NODE BIOPSY;  Surgeon: [name], MD;  Location: [facility];  Service: General;  Laterality: Left;   PORT-A-CATH REMOVAL Right 03/31/2019   Procedure: REMOVAL PORT-A-CATH;  Surgeon: [name], DO;  Location: [facility];  Service: Plastics;  Laterality: Right;   PORTACATH PLACEMENT Right 12/17/2017   Procedure: INSERTION PORT-A-CATH;  Surgeon: [name], MD;  Location: [facility];  Service: General;  Laterality: Right;   REMOVAL OF BILATERAL TISSUE EXPANDERS WITH PLACEMENT OF BILATERAL  BREAST IMPLANTS Bilateral 03/31/2019   Procedure: REMOVAL OF BILATERAL TISSUE EXPANDERS WITH PLACEMENT OF BILATERAL BREAST IMPLANTS;  Surgeon: [name], DO;  Location: [facility];  Service: Plastics;  Laterality: Bilateral;  2 hours   SIMPLE MASTECTOMY WITH AXILLARY SENTINEL NODE BIOPSY Right 12/09/2018   Procedure: SIMPLE MASTECTOMY RIGHT;  Surgeon: [name], MD;  Location: [facility];  Service: General;  Laterality: Right;    There were no vitals filed for this visit.   Subjective Assessment - 12/07/21 0830     Subjective Pt reports no pain. Some soreness from rib cage stretch and breathing exercises after last session.    Pertinent History Denise [name] is a 46yoF referred to OPPT by plastic surgery s/p progressive tightness/tenderness in left chest/axillary region s/p breast reconstruction in October 2020 with implants placed after bilateral mastectomy and left radiation. She also reports an occasional "pinching" that began following radiation however has become more frequent as of lately. The pain associated is sharp and brief; pt unable to correlate to specific movements as it happens at rest and with a variety of activities. It does not affect her ability to sleep at night. Pt states her plastic surgeon believes the tightness may be causing her left breast to be pulled laterally.    Limitations House hold activities    How long can you sit comfortably? unlimited    How long can you stand comfortably? unlimited    How long can you walk comfortably? unlimited    Patient Stated Goals Decrease sharp pains    Currently in Pain? No/denies    Pain Onset 1 to 4 weeks ago             Manual therapy: 15 minutes in R side lying  STM to intercostals between ribs 5-6 and 6-7 to improve rib cage pain and "pinching" sensation. 5 minutes total  STM to intercostals between ribs 5-6 and 6-7 with Grey bolster propped on R thorax to bias pt into R lateral lean to provide stretch and  tissue extensibility to L rib cage and intercostals. 5 minutes total coordinating soft tissue release with exhalations.   STM to pec major and L upper trap for L shoulder mobility. 5 minutes total  There.ex:   Review of bucket handle breathing. Use of pt's hands on rib cage with inhalation and exhalation to engage correct rib mechanics with breaths. 2 minutes seated   Standing L  QL stretch in doorway to provide stretch to L thorax and intercostals for improved tissue extensibility. PT demo and multimodal cuing provided for set up. 3x30 sec holds. VC's for breathing throughout.   Palloff anti rotation 5# x10 bilat; 10# 1x10, 1x6 bilat with good carry over of demo for technique.  Farmers carry 20# 2x50' bilat with VC's for neutral posture to prevent L lateral lean. Good carryover from prior session.   GTB D2 chops: x10 bilat. VC's for form/technique with PT demo prior to performance. Mild shoulder discomfort going  from L to R overhead. Improved form/technique going form L to R overhead with pt reporting less shoulder discomfort.    PT Education - 12/07/21 0831     Education Details form/technique with exercise    Person(s) Educated Patient    Methods Explanation;Demonstration;Tactile cues;Verbal cues    Comprehension Verbalized understanding;Returned demonstration              PT Short Term Goals - 11/30/21 1433       PT SHORT TERM GOAL #1   Title Pt will be independent with HEP in order to improve strength and motion, and to decrease pain in order to improve pain-free function at home and work.    Baseline Completes HEP without questions or concerns    Time 2    Period Weeks    Status Achieved               PT Long Term Goals - 11/30/21 1434       PT LONG TERM GOAL #1   Title Pt will decrease worst pain as reported on NPRS by at least 3 points in order to demonstrate clinically significant reduction in pain.    Baseline 10/19/21: 10/10; 11/30/21 7/10 short lived, and less  frequent    Time 6    Period Weeks    Status Achieved      PT LONG TERM GOAL #2   Title Patient will increase FOTO score to equal to or greater than 74 to demonstrate statistically significant improvement in mobility and quality of life.    Baseline 10/19/21: 54; 11/30/20 79    Time 6    Period Weeks    Status Achieved      PT LONG TERM GOAL #3   Title Pt will report complete cessation of "pinching" pains in order to function pain-free at home, work and community without momentary pause due to brief yet severe pain.    Baseline 10/19/21: has become more frequent over the couple months; 11/30/21 less than 10% of the time, brief    Time 6    Period Weeks                   Plan - 12/07/21 8264     Clinical Impression Statement Continuing PT POC with progression of improving L rib cage pinching pain. Pt toelrating exercises well with no pain throughout sesison. Good understanding of exercise with form/technique throughout. Further education and encouragement to continue postural awareness and breathing techniques. Pt will continue to benefit from skilled PT services to progress mobility to decrease pain to return to PLOF.    Personal Factors and Comorbidities Age;Past/Current Experience;Comorbidity 1    Comorbidities History of CA    Examination-Activity Limitations Other    Examination-Participation Restrictions Community Activity    Stability/Clinical Decision Making Evolving/Moderate complexity    Rehab Potential Good    Clinical Impairments Affecting Rehab Potential (+) age, motivation, social support, (-) current sedentary lifestyle, ongoing cancer treatment, other comorbidities    PT Frequency 2x / week    PT Duration 6 weeks    PT Treatment/Interventions ADLs/Self Care Home Management;Aquatic Therapy;Electrical Stimulation;Cryotherapy;Moist Heat;Iontophoresis 62m/ml Dexamethasone;Functional mobility training;Therapeutic activities;Therapeutic  exercise;Traction;Ultrasound;Neuromuscular re-education;Manual techniques;Patient/family education;Passive range of motion;Dry needling;Energy conservation;Taping;Manual lymph drainage    PT Next Visit Plan intercostal release work    PT Home Exercise Plan pec stretch (doorway), W hold at wall, supine chest stretch on foam roll    Consulted and Agree with Plan of Care Patient  Patient will benefit from skilled therapeutic intervention in order to improve the following deficits and impairments:  Increased fascial restricitons, Impaired perceived functional ability, Impaired UE functional use, Pain, Impaired flexibility  Visit Diagnosis: Pain in left upper arm     Problem List Patient Active Problem List   Diagnosis Date Noted   Iron deficiency anemia due to chronic blood loss 02/14/2019   Melena    Epistaxis 01/08/2019   Acquired absence of right breast 12/17/2018   Admission for breast reconstruction following mastectomy 12/09/2018   Use of tamoxifen (Nolvadex) 12/04/2018   Acquired absence of left breast 09/03/2018   Visual changes 08/20/2018   Breast asymmetry following reconstructive surgery 08/13/2018   Hot flashes due to menopause 06/18/2018   Status post left mastectomy 06/04/2018   S/P breast reconstruction, bilateral 05/28/2018   Vasomotor symptoms due to menopause 04/17/2018   Elevated LFTs 03/17/2018   Goals of care, counseling/discussion 03/17/2018   Hypomagnesemia 03/04/2018   Diarrhea 01/02/2018   Encounter for antineoplastic chemotherapy 12/21/2017   Encounter for antineoplastic immunotherapy 12/21/2017   Carrier of high risk cancer gene mutation 08/06/2017   Breast mass, right 64/29/0379   Monoallelic mutation of CHEK2 gene in female patient 06/20/2015   Carcinoma of upper-outer quadrant of left breast in female, estrogen receptor positive ([place]) 09/17/2014    Salem Caster. Fairly IV, PT, DPT Physical Therapist- [facility]  12/07/2021, 9:15 AM  [facility] [address] [place], [address] Phone: [phone]   Fax:  [phone]  Name: Denise [name] MRN: [id number] Date of Birth: September 28, 1975

## 2021-12-15 ENCOUNTER — Ambulatory Visit: Payer: BC Managed Care – PPO | Admitting: Physical Therapy

## 2021-12-22 ENCOUNTER — Ambulatory Visit: Payer: BC Managed Care – PPO | Admitting: Physical Therapy

## 2021-12-26 ENCOUNTER — Encounter: Payer: Self-pay | Admitting: Physical Therapy

## 2021-12-26 ENCOUNTER — Ambulatory Visit: Payer: BC Managed Care – PPO | Attending: Surgical | Admitting: Physical Therapy

## 2021-12-26 DIAGNOSIS — M79622 Pain in left upper arm: Secondary | ICD-10-CM | POA: Insufficient documentation

## 2021-12-26 NOTE — Therapy (Signed)
[place] [facility] [address] [place], [address] Phone: [phone]   Fax:  [phone]  Physical Therapy Treatment  Patient Details  Name: Denise [name] MRN: [id number] Date of Birth: Jun 04, 1976 No data recorded  Encounter Date: 12/26/2021   PT End of Session - 12/26/21 1502     Visit Number 12    Number of Visits 13    Date for PT Re-Evaluation 12/30/21    Authorization Type [employer] State Employee- 30VL per calendar year    Authorization Time Period 10/19/21-11/30/21    Authorization - Visit Number 12    Authorization - Number of Visits 30    Progress Note Due on Visit 10    PT Start Time 8101    PT Stop Time 1515    PT Time Calculation (min) 38 min    Activity Tolerance Patient tolerated treatment well;No increased pain    Behavior During Therapy WFL for tasks assessed/performed             Past Medical History:  Diagnosis Date   Abnormal breast biopsy    PASH, [name]   Breast cancer [facility]) 11/2017   left breast; ER/PR/Her2neu POS   Family history of breast cancer    Family history of ovarian cancer    Fibroadenoma of breast, left 2000, 2016   Genetic screening 08/26/2013   BRCA/BART negative/Myriad, CHEK2 POS 2017   Hypertension    Increased risk of breast cancer 2017   IBIS=47%   Left breast lump 06/18/2017   Fluid/drained [name]   Migraine    Monoallelic mutation of CHEK2 gene in female patient 2017   increased risk of breast and colon cancer   Personal history of chemotherapy    Personal history of radiation therapy     Past Surgical History:  Procedure Laterality Date   BREAST BIOPSY Bilateral 09/08/14   neg   BREAST BIOPSY Left 03/16/2015   Procedure: BREAST BIOPSY WITH NEEDLE LOCALIZATION;  Surgeon: [name], MD;  Location: [facility];  Service: General;  Laterality: Left;   BREAST BIOPSY Left 11/2017   positive   BREAST CYST ASPIRATION Left 06/18/2017   cyst  aspiration   BREAST EXCISIONAL BIOPSY Left 09/2014   BREAST EXCISIONAL BIOPSY Right    age 46's   BREAST RECONSTRUCTION WITH PLACEMENT OF TISSUE EXPANDER AND FLEX HD (ACELLULAR HYDRATED DERMIS) Left 05/20/2018   Procedure: BREAST RECONSTRUCTION WITH PLACEMENT OF TISSUE EXPANDER AND FLEX HD (ACELLULAR HYDRATED DERMIS);  Surgeon: [name], DO;  Location: [facility];  Service: Plastics;  Laterality: Left;   BREAST RECONSTRUCTION WITH PLACEMENT OF TISSUE EXPANDER AND FLEX HD (ACELLULAR HYDRATED DERMIS) Right 12/09/2018   Procedure: BREAST RECONSTRUCTION WITH PLACEMENT OF TISSUE EXPANDER AND FLEX HD (ACELLULAR HYDRATED DERMIS);  Surgeon: [name], DO;  Location: [facility];  Service: Plastics;  Laterality: Right;   BREAST SURGERY Right March 2000   benign fibroadenoma   BREAST SURGERY Left 08/08/13   excision   BREAST SURGERY Left 09/23/14   excision   COLONOSCOPY WITH PROPOFOL N/A 09/28/2021   Procedure: COLONOSCOPY WITH PROPOFOL;  Surgeon: [name], MD;  Location: [place];  Service: Endoscopy;  Laterality: N/A;   ESOPHAGOGASTRODUODENOSCOPY (EGD) WITH PROPOFOL N/A 01/20/2019   Procedure: ESOPHAGOGASTRODUODENOSCOPY (EGD) WITH PROPOFOL;  Surgeon: [name], MD;  Location: [place];  Service: Gastroenterology;  Laterality: N/A;   MASTECTOMY Left 05/20/2018   MASTECTOMY W/ SENTINEL NODE BIOPSY Left 05/20/2018  Procedure: MASTECTOMY WITH SENTINEL LYMPH NODE BIOPSY;  Surgeon: [name], MD;  Location: [facility];  Service: General;  Laterality: Left;   PORT-A-CATH REMOVAL Right 03/31/2019   Procedure: REMOVAL PORT-A-CATH;  Surgeon: [name], DO;  Location: [facility];  Service: Plastics;  Laterality: Right;   PORTACATH PLACEMENT Right 12/17/2017   Procedure: INSERTION PORT-A-CATH;  Surgeon: [name], MD;  Location: [facility];  Service: General;  Laterality: Right;   REMOVAL OF BILATERAL TISSUE EXPANDERS WITH PLACEMENT OF BILATERAL  BREAST IMPLANTS Bilateral 03/31/2019   Procedure: REMOVAL OF BILATERAL TISSUE EXPANDERS WITH PLACEMENT OF BILATERAL BREAST IMPLANTS;  Surgeon: [name], DO;  Location: [facility];  Service: Plastics;  Laterality: Bilateral;  2 hours   SIMPLE MASTECTOMY WITH AXILLARY SENTINEL NODE BIOPSY Right 12/09/2018   Procedure: SIMPLE MASTECTOMY RIGHT;  Surgeon: [name], MD;  Location: [facility];  Service: General;  Laterality: Right;    There were no vitals filed for this visit.   Subjective Assessment - 12/26/21 1455     Subjective Pt reports some continued "pinch sensation" that has increased since going on vacation. Compliance with HEP    Pertinent History Allis Quirarte is a 46yoF referred to OPPT by plastic surgery s/p progressive tightness/tenderness in left chest/axillary region s/p breast reconstruction in October 2020 with implants placed after bilateral mastectomy and left radiation. She also reports an occasional "pinching" that began following radiation however has become more frequent as of lately. The pain associated is sharp and brief; pt unable to correlate to specific movements as it happens at rest and with a variety of activities. It does not affect her ability to sleep at night. Pt states her plastic surgeon believes the tightness may be causing her left breast to be pulled laterally.    Limitations House hold activities    How long can you sit comfortably? unlimited    How long can you stand comfortably? unlimited    How long can you walk comfortably? unlimited    Patient Stated Goals Decrease sharp pains    Pain Onset 1 to 4 weeks ago            Manual therapy with pt in supine   STM to left pec major and L upper trap for 20 min for trigger point release to improve L shoulder mobility.      There.ex: Open book x12 with cuing for breath control with good carry over  Palloff anti rotation 10# 2x 10 bilat with good carry over of demo for technique   Wood  chops PNF D1 8# 2x 10 attempted 10# initially too difficult, excellent carry over of demo and cuing for eccentric control   Half moon with rotation stretch 2x 30sec hold                                  PT Education - 12/26/21 1501     Education Details terex form/technique    Person(s) Educated Patient    Methods Explanation;Demonstration;Verbal cues    Comprehension Verbalized understanding;Returned demonstration;Verbal cues required              PT Short Term Goals - 11/30/21 1433       PT SHORT TERM GOAL #1   Title Pt will be independent with HEP in order to improve strength and motion, and to decrease pain in order to improve pain-free function at home and work.  Baseline Completes HEP without questions or concerns    Time 2    Period Weeks    Status Achieved               PT Long Term Goals - 11/30/21 1434       PT LONG TERM GOAL #1   Title Pt will decrease worst pain as reported on NPRS by at least 3 points in order to demonstrate clinically significant reduction in pain.    Baseline 10/19/21: 10/10; 11/30/21 7/10 short lived, and less frequent    Time 6    Period Weeks    Status Achieved      PT LONG TERM GOAL #2   Title Patient will increase FOTO score to equal to or greater than 74 to demonstrate statistically significant improvement in mobility and quality of life.    Baseline 10/19/21: 54; 11/30/20 79    Time 6    Period Weeks    Status Achieved      PT LONG TERM GOAL #3   Title Pt will report complete cessation of "pinching" pains in order to function pain-free at home, work and community without momentary pause due to brief yet severe pain.    Baseline 10/19/21: has become more frequent over the couple months; 11/30/21 less than 10% of the time, brief    Time 6    Period Weeks                   Plan - 12/26/21 1515     Clinical Impression Statement PT utilized increased manual techniques d/t increased muscle  tension with relief. PT continued therex progression for eccentric lengthening of intercostals with rotational movements as well, with patient able to comply with all cuing for proper technique of therex. PT will continue progression as able.    Personal Factors and Comorbidities Age;Past/Current Experience;Comorbidity 1    Comorbidities History of CA    Examination-Activity Limitations Other    Examination-Participation Restrictions Community Activity    Stability/Clinical Decision Making Evolving/Moderate complexity    Clinical Decision Making Moderate    Rehab Potential Good    Clinical Impairments Affecting Rehab Potential (+) age, motivation, social support, (-) current sedentary lifestyle, ongoing cancer treatment, other comorbidities    PT Frequency 2x / week    PT Duration 6 weeks    PT Treatment/Interventions ADLs/Self Care Home Management;Aquatic Therapy;Electrical Stimulation;Cryotherapy;Moist Heat;Iontophoresis 55m/ml Dexamethasone;Functional mobility training;Therapeutic activities;Therapeutic exercise;Traction;Ultrasound;Neuromuscular re-education;Manual techniques;Patient/family education;Passive range of motion;Dry needling;Energy conservation;Taping;Manual lymph drainage    PT Next Visit Plan intercostal release work    PT Home Exercise Plan pec stretch (doorway), W hold at wall, supine chest stretch on foam roll    Consulted and Agree with Plan of Care Patient             Patient will benefit from skilled therapeutic intervention in order to improve the following deficits and impairments:  Increased fascial restricitons, Impaired perceived functional ability, Impaired UE functional use, Pain, Impaired flexibility  Visit Diagnosis: Pain in left upper arm     Problem List Patient Active Problem List   Diagnosis Date Noted   Iron deficiency anemia due to chronic blood loss 02/14/2019   Melena    Epistaxis 01/08/2019   Acquired absence of right breast 12/17/2018    Admission for breast reconstruction following mastectomy 12/09/2018   Use of tamoxifen (Nolvadex) 12/04/2018   Acquired absence of left breast 09/03/2018   Visual changes 08/20/2018   Breast asymmetry following reconstructive surgery 08/13/2018  Hot flashes due to menopause 06/18/2018   Status post left mastectomy 06/04/2018   S/P breast reconstruction, bilateral 05/28/2018   Vasomotor symptoms due to menopause 04/17/2018   Elevated LFTs 03/17/2018   Goals of care, counseling/discussion 03/17/2018   Hypomagnesemia 03/04/2018   Diarrhea 01/02/2018   Encounter for antineoplastic chemotherapy 12/21/2017   Encounter for antineoplastic immunotherapy 12/21/2017   Carrier 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 [place], [address] Phone: [phone]   Fax:  [phone]  Name: CORLENE SABIA MRN: [id number] Date of Birth: [birth year]

## 2021-12-27 ENCOUNTER — Ambulatory Visit: Payer: BC Managed Care – PPO | Admitting: Plastic Surgery

## 2021-12-27 ENCOUNTER — Ambulatory Visit (INDEPENDENT_AMBULATORY_CARE_PROVIDER_SITE_OTHER): Payer: BC Managed Care – PPO | Admitting: Physician Assistant

## 2021-12-27 DIAGNOSIS — N644 Mastodynia: Secondary | ICD-10-CM | POA: Diagnosis not present

## 2021-12-27 DIAGNOSIS — Z9889 Other specified postprocedural states: Secondary | ICD-10-CM

## 2021-12-27 NOTE — Progress Notes (Signed)
Referring Provider [name], PA-C [place],  [place] [address]   CC:  Chief Complaint  Patient presents with   Follow-up      EMAREE [name] is an 46 y.o. female.  HPI: Patient is a pleasant 46 year old female with PMH of bilateral breast reconstruction with implant exchange performed 03/2019 by [name] who presents to clinic for 51-monthfollow-up.  Patient has a history of left-sided lymph node dissection and subsequent radiation.  For the past year, she has been experiencing a 7 out of 10 "pinching" pain that is fleeting at lateral aspect of left breast, typically lasting only 2 to 3 seconds duration.  On average, she experiences this approximately once per week.  She had been seen here in clinic for these symptoms most recently 09/2021 and subsequently referred to physical therapy.  She reports that since then, she has felt as though her muscles have loosened and that perhaps her episodes of pain are a bit less frequent.  Given that her right-sided reconstruction has not had any issues, she wanted to ensure that her pain symptoms were not reflective of something abnormal such as her implant migrating towards lateral chest wall.  She states that her pain symptoms are not always associated with certain muscle movements and are not reproducible.  They can occur even while at rest.  Denies any other new symptoms or concerns.   Allergies  Allergen Reactions   Steri-Strip Compound Benzoin [Benzoin Compound]     Steri-strip ,    Meloxicam Rash   Silicone Rash    Minor skin irritation Minor skin irritation  Minor skin irritation Minor skin irritation Minor skin irritation  Minor skin irritation   Tape Other (See Comments)    Minor skin irritation  Minor skin irritation    Outpatient Encounter Medications as of 12/27/2021  Medication Sig   acetaminophen (TYLENOL) 500 MG tablet Take 1,000 mg by mouth every 6 (six) hours as needed for moderate  pain or headache.    cholecalciferol (VITAMIN D3) 25 MCG (1000 UNIT) tablet Take 3,000 Units by mouth daily.   loratadine (CLARITIN) 10 MG tablet Take 10 mg by mouth daily.   tamoxifen (NOLVADEX) 20 MG tablet Take 1 tablet (20 mg total) by mouth daily.   venlafaxine XR (EFFEXOR-XR) 37.5 MG 24 hr capsule Take 1 capsule (37.5 mg total) by mouth daily with breakfast.   vitamin C (ASCORBIC ACID) 500 MG tablet Take 500 mg by mouth daily.   [DISCONTINUED] omeprazole (PRILOSEC) 20 MG capsule Take 20 mg by mouth daily as needed. (Patient not taking: Reported on 12/01/2021)   [DISCONTINUED] prochlorperazine (COMPAZINE) 10 MG tablet Take 1 tablet (10 mg total) by mouth every 6 (six) hours as needed (Nausea or vomiting). (Patient not taking: Reported on 05/06/2018)   Facility-Administered Encounter Medications as of 12/27/2021  Medication   paragard intrauterine copper IUD   sodium chloride flush (NS) 0.9 % injection 10 mL     Past Medical History:  Diagnosis Date   Abnormal breast biopsy    PASH, Dr. BBary Castilla  Breast cancer ([facility] 11/2017   left breast; ER/PR/Her2neu POS   Family history of breast cancer    Family history of ovarian cancer    Fibroadenoma of breast, left 2000, 2016   Genetic screening 08/26/2013   BRCA/BART negative/[facility], CHEK2 POS 2017   Hypertension    Increased risk of breast cancer 2017   IBIS=47%   Left breast lump 06/18/2017   Fluid/drained  [name]   Migraine    Monoallelic mutation of CHEK2 gene in female patient 2017   increased risk of breast and colon cancer   Personal history of chemotherapy    Personal history of radiation therapy     Past Surgical History:  Procedure Laterality Date   BREAST BIOPSY Bilateral 09/08/14   neg   BREAST BIOPSY Left 03/16/2015   Procedure: BREAST BIOPSY WITH NEEDLE LOCALIZATION;  Surgeon: [name], MD;  Location: [facility];  Service: General;  Laterality: Left;   BREAST BIOPSY Left 11/2017   positive   BREAST CYST  ASPIRATION Left 06/18/2017   cyst aspiration   BREAST EXCISIONAL BIOPSY Left 09/2014   BREAST EXCISIONAL BIOPSY Right    age 41's   BREAST RECONSTRUCTION WITH PLACEMENT OF TISSUE EXPANDER AND FLEX HD (ACELLULAR HYDRATED DERMIS) Left 05/20/2018   Procedure: BREAST RECONSTRUCTION WITH PLACEMENT OF TISSUE EXPANDER AND FLEX HD (ACELLULAR HYDRATED DERMIS);  Surgeon: [name], DO;  Location: [facility];  Service: Plastics;  Laterality: Left;   BREAST RECONSTRUCTION WITH PLACEMENT OF TISSUE EXPANDER AND FLEX HD (ACELLULAR HYDRATED DERMIS) Right 12/09/2018   Procedure: BREAST RECONSTRUCTION WITH PLACEMENT OF TISSUE EXPANDER AND FLEX HD (ACELLULAR HYDRATED DERMIS);  Surgeon: [name], DO;  Location: [facility];  Service: Plastics;  Laterality: Right;   BREAST SURGERY Right March 2000   benign fibroadenoma   BREAST SURGERY Left 08/08/13   excision   BREAST SURGERY Left 09/23/14   excision   COLONOSCOPY WITH PROPOFOL N/A 09/28/2021   Procedure: COLONOSCOPY WITH PROPOFOL;  Surgeon: [name], MD;  Location: [facility];  Service: Endoscopy;  Laterality: N/A;   ESOPHAGOGASTRODUODENOSCOPY (EGD) WITH PROPOFOL N/A 01/20/2019   Procedure: ESOPHAGOGASTRODUODENOSCOPY (EGD) WITH PROPOFOL;  Surgeon: [name], MD;  Location: [facility];  Service: Gastroenterology;  Laterality: N/A;   MASTECTOMY Left 05/20/2018   MASTECTOMY W/ SENTINEL NODE BIOPSY Left 05/20/2018   Procedure: MASTECTOMY WITH SENTINEL LYMPH NODE BIOPSY;  Surgeon: [name], MD;  Location: [facility];  Service: General;  Laterality: Left;   PORT-A-CATH REMOVAL Right 03/31/2019   Procedure: REMOVAL PORT-A-CATH;  Surgeon: [name], DO;  Location: [facility];  Service: Plastics;  Laterality: Right;   PORTACATH PLACEMENT Right 12/17/2017   Procedure: INSERTION PORT-A-CATH;  Surgeon: [name], MD;  Location: [facility];  Service: General;  Laterality: Right;   REMOVAL OF BILATERAL TISSUE  EXPANDERS WITH PLACEMENT OF BILATERAL BREAST IMPLANTS Bilateral 03/31/2019   Procedure: REMOVAL OF BILATERAL TISSUE EXPANDERS WITH PLACEMENT OF BILATERAL BREAST IMPLANTS;  Surgeon: [name], DO;  Location: [facility];  Service: Plastics;  Laterality: Bilateral;  2 hours   SIMPLE MASTECTOMY WITH AXILLARY SENTINEL NODE BIOPSY Right 12/09/2018   Procedure: SIMPLE MASTECTOMY RIGHT;  Surgeon: [name], MD;  Location: [facility];  Service: General;  Laterality: Right;    Family History  Problem Relation Age of Onset   Skin cancer Mother    Prostate cancer Father 75       requiring tx/hormones   Depression Sister    Breast cancer Paternal Grandmother 20   Breast cancer Maternal Aunt 60   Ovarian cancer Maternal Aunt 70   Pancreatic cancer Maternal Aunt 75   Breast cancer Paternal Aunt 68   Stroke Maternal Grandmother    Stroke Maternal Grandfather    Alcohol abuse Paternal Grandfather    Prostate cancer Maternal Uncle    Alcohol abuse Paternal Uncle    Anxiety disorder Paternal  Uncle     Social History   Social History Narrative   Not on file     Review of Systems General: Denies fevers or chills Breast: Endorses intermittent fleeting pain.  Denies swelling, wounds, redness, or other overlying skin changes.  Physical Exam    12/01/2021   11:00 AM 09/28/2021    7:09 AM 08/23/2021   10:11 AM  Vitals with BMI  Height 5' 6"  5' 6"    Weight 172 lbs 10 oz 165 lbs 3 oz 173 lbs 6 oz  BMI 94.58 59.29   Systolic 244 628 638  Diastolic 99 92 78  Pulse 79 92 85    General:  No acute distress, nontoxic appearing  Respiratory: No increased work of breathing Neuro: Alert and oriented Psychiatric: Normal mood and affect  MSK: ROM and strength of upper extremities intact. Breasts: Good shape and symmetry.  Right breast slightly more ptotic as left breast has been radiated.  No significant skin changes from radiation.  Left breast flap laterally appears thin.  No significant  tenderness along lateral aspect of left breast.  No cording or other masses noted.  No fluid collections.  No redness or overlying skin changes.  S/p nipple areolar tattooing.  Assessment/Plan  Patient describes intermittent fleeting "pinching" 8 out of 10 pain over left lateral breast that lasts approximately 2 to 3 seconds before resolving.  Occurrences average once per week.  She states that this is not disabling or life altering.  She simply wanted to ensure that there was not any abnormal findings or concerns given that she has not experienced similar symptoms on her right side.  Given that her symptoms did not significantly improve with PT and that they are not reproducible with movements, lower suspicion for musculoskeletal etiology.  Her symptoms may be reflective of neuropathic pain.   She understands that even if surgery is possible, it may not be able to correct her pain symptoms, particularly if they are neuropathic.  She is also at increased risk for poor wound healing and infection given her history of radiation.  Given that her symptoms are not disabling, may wish to proceed with watchful waiting rather than imaging or surgical consult.  Will discuss with surgeon and call patient to advise them as to next steps.    Pictures had been updated already at most recent visit 09/2021.    Krista Blue, PA-C   Krista Blue 12/27/2021, 4:43 PM

## 2021-12-29 ENCOUNTER — Ambulatory Visit: Payer: BC Managed Care – PPO | Admitting: Physical Therapy

## 2022-01-02 ENCOUNTER — Encounter: Payer: Self-pay | Admitting: Internal Medicine

## 2022-01-02 ENCOUNTER — Ambulatory Visit: Payer: BC Managed Care – PPO | Admitting: Physical Therapy

## 2022-01-02 DIAGNOSIS — M79622 Pain in left upper arm: Secondary | ICD-10-CM

## 2022-01-02 NOTE — Therapy (Signed)
[facility] [facility] [address], [place], [address] Phone: [phone]   Fax:  [phone]  Physical Therapy Treatment  Patient Details  Name: Denise [name] MRN: [id number] Date of Birth: 1976-05-25 No data recorded  Encounter Date: 01/02/2022   PT End of Session - 01/02/22 1557     Visit Number 13    Number of Visits 21    Date for PT Re-Evaluation 02/03/22    Authorization Type [employer]- 30VL per calendar year    Authorization Time Period 10/19/21-11/30/21    Authorization - Visit Number 13    Authorization - Number of Visits 30    Progress Note Due on Visit 10    PT Start Time 5462    PT Stop Time 7035    PT Time Calculation (min) 40 min    Activity Tolerance Patient tolerated treatment well;No increased pain    Behavior During Therapy WFL for tasks assessed/performed             Past Medical History:  Diagnosis Date   Abnormal breast biopsy    PASH, [name]   Breast cancer [facility]) 11/2017   left breast; ER/PR/Her2neu POS   Family history of breast cancer    Family history of ovarian cancer    Fibroadenoma of breast, left 2000, 2016   Genetic screening 08/26/2013   BRCA/BART negative/[facility], CHEK2 POS 2017   Hypertension    Increased risk of breast cancer 2017   IBIS=47%   Left breast lump 06/18/2017   Fluid/drained [name]   Migraine    Monoallelic mutation of CHEK2 gene in female patient 2017   increased risk of breast and colon cancer   Personal history of chemotherapy    Personal history of radiation therapy     Past Surgical History:  Procedure Laterality Date   BREAST BIOPSY Bilateral 09/08/14   neg   BREAST BIOPSY Left 03/16/2015   Procedure: BREAST BIOPSY WITH NEEDLE LOCALIZATION;  Surgeon: [name], MD;  Location: [facility];  Service: General;  Laterality: Left;   BREAST BIOPSY Left 11/2017   positive   BREAST CYST ASPIRATION Left 06/18/2017   cyst  aspiration   BREAST EXCISIONAL BIOPSY Left 09/2014   BREAST EXCISIONAL BIOPSY Right    age 82's   BREAST RECONSTRUCTION WITH PLACEMENT OF TISSUE EXPANDER AND FLEX HD (ACELLULAR HYDRATED DERMIS) Left 05/20/2018   Procedure: BREAST RECONSTRUCTION WITH PLACEMENT OF TISSUE EXPANDER AND FLEX HD (ACELLULAR HYDRATED DERMIS);  Surgeon: [name], DO;  Location: [facility];  Service: Plastics;  Laterality: Left;   BREAST RECONSTRUCTION WITH PLACEMENT OF TISSUE EXPANDER AND FLEX HD (ACELLULAR HYDRATED DERMIS) Right 12/09/2018   Procedure: BREAST RECONSTRUCTION WITH PLACEMENT OF TISSUE EXPANDER AND FLEX HD (ACELLULAR HYDRATED DERMIS);  Surgeon: [name], DO;  Location: [facility];  Service: Plastics;  Laterality: Right;   BREAST SURGERY Right March 2000   benign fibroadenoma   BREAST SURGERY Left 08/08/13   excision   BREAST SURGERY Left 09/23/14   excision   COLONOSCOPY WITH PROPOFOL N/A 09/28/2021   Procedure: COLONOSCOPY WITH PROPOFOL;  Surgeon: [name], MD;  Location: [facility];  Service: Endoscopy;  Laterality: N/A;   ESOPHAGOGASTRODUODENOSCOPY (EGD) WITH PROPOFOL N/A 01/20/2019   Procedure: ESOPHAGOGASTRODUODENOSCOPY (EGD) WITH PROPOFOL;  Surgeon: [name], MD;  Location: [facility];  Service: Gastroenterology;  Laterality: N/A;   MASTECTOMY Left 05/20/2018   MASTECTOMY W/ SENTINEL NODE BIOPSY Left 05/20/2018  Procedure: MASTECTOMY WITH SENTINEL LYMPH NODE BIOPSY;  Surgeon: [name], MD;  Location: [facility];  Service: General;  Laterality: Left;   PORT-A-CATH REMOVAL Right 03/31/2019   Procedure: REMOVAL PORT-A-CATH;  Surgeon: [name], DO;  Location: [facility];  Service: Plastics;  Laterality: Right;   PORTACATH PLACEMENT Right 12/17/2017   Procedure: INSERTION PORT-A-CATH;  Surgeon: [name], MD;  Location: [facility];  Service: General;  Laterality: Right;   REMOVAL OF BILATERAL TISSUE EXPANDERS WITH PLACEMENT OF BILATERAL  BREAST IMPLANTS Bilateral 03/31/2019   Procedure: REMOVAL OF BILATERAL TISSUE EXPANDERS WITH PLACEMENT OF BILATERAL BREAST IMPLANTS;  Surgeon: [name], DO;  Location: [facility];  Service: Plastics;  Laterality: Bilateral;  2 hours   SIMPLE MASTECTOMY WITH AXILLARY SENTINEL NODE BIOPSY Right 12/09/2018   Procedure: SIMPLE MASTECTOMY RIGHT;  Surgeon: [name], MD;  Location: [facility];  Service: General;  Laterality: Right;    There were no vitals filed for this visit.   Subjective Assessment - 01/02/22 1519     Subjective Met with oncologist, with minimal answers, following up in a couple weeks. Still having some minimal pinching sensation.    Pertinent History Denise [name] is a 46yoF referred to OPPT by plastic surgery s/p progressive tightness/tenderness in left chest/axillary region s/p breast reconstruction in October 2020 with implants placed after bilateral mastectomy and left radiation. She also reports an occasional "pinching" that began following radiation however has become more frequent as of lately. The pain associated is sharp and brief; pt unable to correlate to specific movements as it happens at rest and with a variety of activities. It does not affect her ability to sleep at night. Pt states her plastic surgeon believes the tightness may be causing her left breast to be pulled laterally.    Limitations House hold activities    How long can you sit comfortably? unlimited    How long can you stand comfortably? unlimited    How long can you walk comfortably? unlimited    Patient Stated Goals Decrease sharp pains    Pain Onset 1 to 4 weeks ago              Manual therapy with pt in supine   STM to left pec major and L upper trap for 20 min for trigger point release to improve L shoulder mobility.      There.ex: Total gym anitrotation level 1.5 2x 10; level 2.5 x6  Total gym pull up 3x 8/7/6 L15 with good carry over of cuing for eccentric control    Figure 8 with 2kg ball in pillow case 2x 30sec with min cuing for max available ROM    Half moon with rotation stretch 2x 30sec hold                            PT Education - 01/02/22 1522     Education Details therex form/technique    Person(s) Educated Patient    Methods Explanation;Demonstration;Verbal cues    Comprehension Verbalized understanding;Returned demonstration;Verbal cues required              PT Short Term Goals - 11/30/21 1433       PT SHORT TERM GOAL #1   Title Pt will be independent with HEP in order to improve strength and motion, and to decrease pain in order to improve pain-free function at home and work.    Baseline Completes HEP without  questions or concerns    Time 2    Period Weeks    Status Achieved               PT Long Term Goals - 11/30/21 1434       PT LONG TERM GOAL #1   Title Pt will decrease worst pain as reported on NPRS by at least 3 points in order to demonstrate clinically significant reduction in pain.    Baseline 10/19/21: 10/10; 11/30/21 7/10 short lived, and less frequent    Time 6    Period Weeks    Status Achieved      PT LONG TERM GOAL #2   Title Patient will increase FOTO score to equal to or greater than 74 to demonstrate statistically significant improvement in mobility and quality of life.    Baseline 10/19/21: 54; 11/30/20 79    Time 6    Period Weeks    Status Achieved      PT LONG TERM GOAL #3   Title Pt will report complete cessation of "pinching" pains in order to function pain-free at home, work and community without momentary pause due to brief yet severe pain.    Baseline 10/19/21: has become more frequent over the couple months; 11/30/21 less than 10% of the time, brief    Time 6    Period Weeks                   Plan - 01/02/22 1559     Clinical Impression Statement PT continued progression to decrease muscle tension and increase rotational strength with higher level  plyometric exercise with success. Patient is able to comply with all cuing for proper technique of therex with no increased pain throughout session. PT reassessed goals where patient is making progression- still with minimal catching sesation. pt will continue to benfit from PT 1x/week to work toward ind d/c status.    Personal Factors and Comorbidities Age;Past/Current Experience;Comorbidity 1    Comorbidities History of CA    Examination-Activity Limitations Other    Examination-Participation Restrictions Community Activity    Stability/Clinical Decision Making Evolving/Moderate complexity    Clinical Decision Making Moderate    Rehab Potential Good    Clinical Impairments Affecting Rehab Potential (+) age, motivation, social support, (-) current sedentary lifestyle, ongoing cancer treatment, other comorbidities    PT Treatment/Interventions ADLs/Self Care Home Management;Aquatic Therapy;Electrical Stimulation;Cryotherapy;Moist Heat;Iontophoresis 61m/ml Dexamethasone;Functional mobility training;Therapeutic activities;Therapeutic exercise;Traction;Ultrasound;Neuromuscular re-education;Manual techniques;Patient/family education;Passive range of motion;Dry needling;Energy conservation;Taping;Manual lymph drainage    PT Next Visit Plan intercostal release work    PT Home Exercise Plan pec stretch (doorway), W hold at wall, supine chest stretch on foam roll    Consulted and Agree with Plan of Care Patient             Patient will benefit from skilled therapeutic intervention in order to improve the following deficits and impairments:  Increased fascial restricitons, Impaired perceived functional ability, Impaired UE functional use, Pain, Impaired flexibility  Visit Diagnosis: No diagnosis found.     Problem List Patient Active Problem List   Diagnosis Date Noted   Iron deficiency anemia due to chronic blood loss 02/14/2019   Melena    Epistaxis 01/08/2019   Acquired absence of right  breast 12/17/2018   Admission for breast reconstruction following mastectomy 12/09/2018   Use of tamoxifen (Nolvadex) 12/04/2018   Acquired absence of left breast 09/03/2018   Visual changes 08/20/2018   Breast asymmetry following reconstructive surgery 08/13/2018  Hot flashes due to menopause 06/18/2018   Status post left mastectomy 06/04/2018   S/P breast reconstruction, bilateral 05/28/2018   Vasomotor symptoms due to menopause 04/17/2018   Elevated LFTs 03/17/2018   Goals of care, counseling/discussion 03/17/2018   Hypomagnesemia 03/04/2018   Diarrhea 01/02/2018   Encounter for antineoplastic chemotherapy 12/21/2017   Encounter for antineoplastic immunotherapy 12/21/2017   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 [place], [address] Phone: [phone]   Fax:  [phone]  Name: ARIANI SEIER MRN: [id number] Date of Birth: 09/29/1975

## 2022-01-05 ENCOUNTER — Telehealth: Payer: Self-pay | Admitting: Internal Medicine

## 2022-01-05 ENCOUNTER — Ambulatory Visit: Payer: BC Managed Care – PPO | Admitting: Physical Therapy

## 2022-01-05 MED ORDER — ESCITALOPRAM OXALATE 10 MG PO TABS: 10.0000 mg | ORAL_TABLET | Freq: Every day | ORAL | 3 refills | Status: DC

## 2022-01-05 NOTE — Telephone Encounter (Signed)
On 7/19-spoke to patient regarding the ongoing side effects of Effexor.  Recommend weaning off Effexor-1 pill every other day for the next 2 weeks and then stop.  Patient can start taking her Lexapro 10 mg/day as ordered thereafter.  Follow-up as planned in December; zometa.

## 2022-01-09 ENCOUNTER — Ambulatory Visit: Payer: BC Managed Care – PPO | Admitting: Physical Therapy

## 2022-01-09 ENCOUNTER — Encounter: Payer: Self-pay | Admitting: Physical Therapy

## 2022-01-09 DIAGNOSIS — M79622 Pain in left upper arm: Secondary | ICD-10-CM

## 2022-01-09 NOTE — Therapy (Signed)
OUTPATIENT PHYSICAL THERAPY TREATMENT NOTE   Patient Name: Denise Denise MRN: [id number] DOB:01/19/1976, 46 y.o., female Today's Date: 01/09/2022  PCP: Denise Ormond PA REFERRING PROVIDER: Grace Bushy PA   PT End of Session - 01/09/22 1449     Visit Number 14    Number of Visits 21    Date for PT Re-Evaluation 02/03/22    Authorization Type [employer]- 30VL per calendar year    Authorization Time Period 10/19/21-11/30/21    Authorization - Visit Number 14    Authorization - Number of Visits 30    Progress Note Due on Visit 10    PT Start Time 2952    PT Stop Time 8413    PT Time Calculation (min) 40 min    Activity Tolerance Patient tolerated treatment well;No increased pain             Past Medical History:  Diagnosis Date   Abnormal breast biopsy    PASH, Denise Denise   Breast cancer [facility]) 11/2017   left breast; ER/PR/Her2neu POS   Family history of breast cancer    Family history of ovarian cancer    Fibroadenoma of breast, left 2000, 2016   Genetic screening 08/26/2013   BRCA/BART negative/[facility], CHEK2 POS 2017   Hypertension    Increased risk of breast cancer 2017   IBIS=47%   Left breast lump 06/18/2017   Fluid/drained Denise Denise   Migraine    Monoallelic mutation of CHEK2 gene in female patient 2017   increased risk of breast and colon cancer   Personal history of chemotherapy    Personal history of radiation therapy    Past Surgical History:  Procedure Laterality Date   BREAST BIOPSY Bilateral 09/08/14   neg   BREAST BIOPSY Left 03/16/2015   Procedure: BREAST BIOPSY WITH NEEDLE LOCALIZATION;  Surgeon: Denise Bellow, MD;  Location: [facility];  Service: General;  Laterality: Left;   BREAST BIOPSY Left 11/2017   positive   BREAST CYST ASPIRATION Left 06/18/2017   cyst aspiration   BREAST EXCISIONAL BIOPSY Left 09/2014   BREAST EXCISIONAL BIOPSY Right    age 8's   BREAST RECONSTRUCTION WITH PLACEMENT OF TISSUE EXPANDER AND FLEX  HD (ACELLULAR HYDRATED DERMIS) Left 05/20/2018   Procedure: BREAST RECONSTRUCTION WITH PLACEMENT OF TISSUE EXPANDER AND FLEX HD (ACELLULAR HYDRATED DERMIS);  Surgeon: Denise Denise;  Location: [facility];  Service: Plastics;  Laterality: Left;   BREAST RECONSTRUCTION WITH PLACEMENT OF TISSUE EXPANDER AND FLEX HD (ACELLULAR HYDRATED DERMIS) Right 12/09/2018   Procedure: BREAST RECONSTRUCTION WITH PLACEMENT OF TISSUE EXPANDER AND FLEX HD (ACELLULAR HYDRATED DERMIS);  Surgeon: Denise Denise;  Location: [facility];  Service: Plastics;  Laterality: Right;   BREAST SURGERY Right March 2000   benign fibroadenoma   BREAST SURGERY Left 08/08/13   excision   BREAST SURGERY Left 09/23/14   excision   COLONOSCOPY WITH PROPOFOL N/A 09/28/2021   Procedure: COLONOSCOPY WITH PROPOFOL;  Surgeon: Denise Bellow, MD;  Location: [place];  Service: Endoscopy;  Laterality: N/A;   ESOPHAGOGASTRODUODENOSCOPY (EGD) WITH PROPOFOL N/A 01/20/2019   Procedure: ESOPHAGOGASTRODUODENOSCOPY (EGD) WITH PROPOFOL;  Surgeon: Denise Landsman, MD;  Location: [place];  Service: Gastroenterology;  Laterality: N/A;   MASTECTOMY Left 05/20/2018   MASTECTOMY W/ SENTINEL NODE BIOPSY Left 05/20/2018   Procedure: MASTECTOMY WITH SENTINEL LYMPH NODE BIOPSY;  Surgeon: Denise Bellow, MD;  Location: [facility];  Service: General;  Laterality: Left;   PORT-A-CATH REMOVAL  Right 03/31/2019   Procedure: REMOVAL PORT-A-CATH;  Surgeon: Denise Denise;  Location: [facility];  Service: Plastics;  Laterality: Right;   PORTACATH PLACEMENT Right 12/17/2017   Procedure: INSERTION PORT-A-CATH;  Surgeon: Denise Bellow, MD;  Location: [facility];  Service: General;  Laterality: Right;   REMOVAL OF BILATERAL TISSUE EXPANDERS WITH PLACEMENT OF BILATERAL BREAST IMPLANTS Bilateral 03/31/2019   Procedure: REMOVAL OF BILATERAL TISSUE EXPANDERS WITH PLACEMENT OF BILATERAL BREAST IMPLANTS;  Surgeon: Denise Denise;   Location: [facility];  Service: Plastics;  Laterality: Bilateral;  2 hours   SIMPLE MASTECTOMY WITH AXILLARY SENTINEL NODE BIOPSY Right 12/09/2018   Procedure: SIMPLE MASTECTOMY RIGHT;  Surgeon: Denise Bellow, MD;  Location: [facility];  Service: General;  Laterality: Right;   Patient Active Problem List   Diagnosis Date Noted   Iron deficiency anemia due to chronic blood loss 02/14/2019   Melena    Epistaxis 01/08/2019   Acquired absence of right breast 12/17/2018   Admission for breast reconstruction following mastectomy 12/09/2018   Use of tamoxifen (Nolvadex) 12/04/2018   Acquired absence of left breast 09/03/2018   Visual changes 08/20/2018   Breast asymmetry following reconstructive surgery 08/13/2018   Hot flashes due to menopause 06/18/2018   Status post left mastectomy 06/04/2018   S/P breast reconstruction, bilateral 05/28/2018   Vasomotor symptoms due to menopause 04/17/2018   Elevated LFTs 03/17/2018   Goals of care, counseling/discussion 03/17/2018   Hypomagnesemia 03/04/2018   Diarrhea 01/02/2018   Encounter for antineoplastic chemotherapy 12/21/2017   Encounter for antineoplastic immunotherapy 12/21/2017   Carrier of high risk cancer gene mutation 08/06/2017   Breast mass, right 42/59/5638   Monoallelic mutation of CHEK2 gene in female patient 06/20/2015   Carcinoma of upper-outer quadrant of left breast in female, estrogen receptor positive ([place]) 09/17/2014    REFERRING DIAG: L shoulder/thoracic pain   THERAPY DIAG:  Pain in left upper arm  Rationale for Evaluation and Treatment Rehabilitation  PERTINENT HISTORY: Denise Denise is a 12yoF referred to OPPT by plastic surgery s/p progressive tightness/tenderness in left chest/axillary region s/p breast reconstruction in October 2020 with implants placed after bilateral mastectomy and left radiation. She also reports an occasional "pinching" that began following radiation however has become more frequent as of  lately. The pain associated is sharp and brief; pt unable to correlate to specific movements as it happens at rest and with a variety of activities. It does not affect her ability to sleep at night. Pt states her plastic surgeon believes the tightness may be causing her left breast to be pulled laterally.  PRECAUTIONS: none  SUBJECTIVE: Reports no pain. Hand 1x instance of pinching, reports brought on by stress. Compliance with HEP  PAIN:  Are you having pain? No     TODAY'S TREATMENT:  Manual therapy with pt in supine   STM to left pec major and L upper trap for 20 min for trigger point release to improve L shoulder mobility.      There.ex: Unilateral lat pulldown 15# 3x 10 with good carry over of initial demo  Total gym anitrotation level 3; 3x 6/6/8   Chest press pass to rebounder 5.3# ball x8 x12   Figure 8 with 4kg ball in pillow case 2x 30sec with min cuing for max available ROM    Half moon with rotation stretch 2x 30sec hold   PATIENT EDUCATION: Education details: therex from/technique  Person educated: Patient Education method: Consulting civil engineer, Demonstration, and Verbal cues Education  comprehension: verbalized understanding, returned demonstration, and verbal cues required    HOME EXERCISE PROGRAM: Pec stretch W at wall Supine chest stretch   PT Short Term Goals - 11/30/21 1433       PT SHORT TERM GOAL #1   Title Pt will be independent with HEP in order to improve strength and motion, and to decrease pain in order to improve pain-free function at home and work.    Baseline Completes HEP without questions or concerns    Time 2    Period Weeks    Status Achieved              PT Long Term Goals - 11/30/21 1434       PT LONG TERM GOAL #1   Title Pt will decrease worst pain as reported on NPRS by at least 3 points in order to demonstrate clinically significant reduction in pain.    Baseline 10/19/21: 10/10; 11/30/21 7/10 short lived, and less frequent     Time 6    Period Weeks    Status Achieved      PT LONG TERM GOAL #2   Title Patient will increase FOTO score to equal to or greater than 74 to demonstrate statistically significant improvement in mobility and quality of life.    Baseline 10/19/21: 54; 11/30/20 79    Time 6    Period Weeks    Status Achieved      PT LONG TERM GOAL #3   Title Pt will report complete cessation of "pinching" pains in order to function pain-free at home, work and community without momentary pause due to brief yet severe pain.    Baseline 10/19/21: has become more frequent over the couple months; 11/30/21 less than 10% of the time, brief    Time 6    Period Weeks              Plan - 01/09/22 1524     Clinical Impression Statement PT continued therex progression for increased rotational strength with increased progression of plyometric exercise demand without increased pain. Pt is able to comply with all cuing for proper technique of therex with good motivation throughout session. PT will continue progression as able.    Personal Factors and Comorbidities Age;Past/Current Experience;Comorbidity 1    Comorbidities History of CA    Examination-Activity Limitations Other    Examination-Participation Restrictions Community Activity    Stability/Clinical Decision Making Evolving/Moderate complexity    Clinical Decision Making Moderate    Rehab Potential Good    Clinical Impairments Affecting Rehab Potential (+) age, motivation, social support, (-) current sedentary lifestyle, ongoing cancer treatment, other comorbidities    PT Frequency 2x / week    PT Duration 6 weeks    PT Treatment/Interventions ADLs/Self Care Home Management;Aquatic Therapy;Electrical Stimulation;Cryotherapy;Moist Heat;Iontophoresis 61m/ml Dexamethasone;Functional mobility training;Therapeutic activities;Therapeutic exercise;Traction;Ultrasound;Neuromuscular re-education;Manual techniques;Patient/family education;Passive range of motion;Dry  needling;Energy conservation;Taping;Manual lymph drainage    PT Next Visit Plan intercostal release work    PT Home Exercise Plan pec stretch (doorway), W hold at wall, supine chest stretch on foam roll    Consulted and Agree with Plan of Care Patient              CDurwin RegesDPT CDurwin Reges PT 01/09/2022, 3:27 PM

## 2022-01-12 ENCOUNTER — Ambulatory Visit: Payer: BC Managed Care – PPO | Admitting: Physical Therapy

## 2022-01-18 ENCOUNTER — Encounter: Payer: Self-pay | Admitting: Plastic Surgery

## 2022-01-18 ENCOUNTER — Ambulatory Visit (INDEPENDENT_AMBULATORY_CARE_PROVIDER_SITE_OTHER): Payer: BC Managed Care – PPO | Admitting: Plastic Surgery

## 2022-01-18 VITALS — BP 127/86 | HR 74 | Ht 67.0 in | Wt 175.0 lb

## 2022-01-18 DIAGNOSIS — Z9012 Acquired absence of left breast and nipple: Secondary | ICD-10-CM

## 2022-01-18 DIAGNOSIS — Z170 Estrogen receptor positive status [ER+]: Secondary | ICD-10-CM | POA: Diagnosis not present

## 2022-01-18 DIAGNOSIS — Z9011 Acquired absence of right breast and nipple: Secondary | ICD-10-CM

## 2022-01-18 DIAGNOSIS — C50412 Malignant neoplasm of upper-outer quadrant of left female breast: Secondary | ICD-10-CM

## 2022-01-18 DIAGNOSIS — Z9013 Acquired absence of bilateral breasts and nipples: Secondary | ICD-10-CM

## 2022-01-18 NOTE — Progress Notes (Signed)
   Subjective:    Patient ID: Denise [name], female    DOB: 07/20/1975, 46 y.o.   MRN: [id number]  The patient is a 46 year old female here for further discussion about her left breast.  Patient started having issues with her left breast in 2015.  She underwent multiple lumpectomies.  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  She had expanders placed at that time.  She did receive left-sided radiation.  In June 2019 she was diagnosed with a papilloma of the right breast and decided for a mastectomy.  She underwent right breast mastectomy with immediate placement of an expander.  In October 2020 she had bilateral removal of expanders and placement of implants -Mentor smooth round high-profile gel 535 cc implants.  She noted tightness in the left breast area several months ago and went to physical therapy.  This did help with her range of motion.  She now has occasional tightness or spasms in the left chest area.  She was on initially worried about it but now it is infrequent enough that she would like to just watch it.  On exam I do not feel anything worrisome.  She has some capsular contracture on the left breast but this is to be expected for her surgery as well as the radiation.    Review of Systems  Constitutional: Negative.   Eyes: Negative.   Respiratory: Negative.    Cardiovascular: Negative.   Gastrointestinal: Negative.   Endocrine: Negative.   Genitourinary: Negative.   Musculoskeletal: Negative.   Hematological: Negative.        Objective:   Physical Exam Constitutional:      Appearance: Normal appearance.  Cardiovascular:     Rate and Rhythm: Normal rate.     Pulses: Normal pulses.  Pulmonary:     Effort: Pulmonary effort is normal.  Abdominal:     Palpations: Abdomen is soft.  Musculoskeletal:        General: No swelling or deformity.  Skin:    General: Skin is warm.     Capillary Refill:  Capillary refill takes less than 2 seconds.     Coloration: Skin is not jaundiced.     Findings: No bruising.  Neurological:     Mental Status: She is alert and oriented to person, place, and time.  Psychiatric:        Mood and Affect: Mood normal.        Behavior: Behavior normal.         Assessment & Plan:     ICD-10-CM   1. Carcinoma of upper-outer quadrant of left breast in female, estrogen receptor positive ([place])  C50.412    Z17.0     2. Acquired absence of left breast  Z90.12     3. Acquired absence of right breast  Z90.11        Pictures were obtained of the patient and placed in the chart with the patient's or guardian's permission.  We talked about the option of an ultrasound or an MRI.  The patient wants to just watch it for right now but knows that those are available if needed.  She was also offered more physical therapy but would like to wait for now.  I would like to see her back in at least 1 year if not sooner.

## 2022-01-20 ENCOUNTER — Institutional Professional Consult (permissible substitution): Payer: BC Managed Care – PPO | Admitting: Plastic Surgery

## 2022-03-03 ENCOUNTER — Inpatient Hospital Stay: Payer: BC Managed Care – PPO | Attending: Internal Medicine

## 2022-03-03 DIAGNOSIS — Z800 Family history of malignant neoplasm of digestive organs: Secondary | ICD-10-CM | POA: Insufficient documentation

## 2022-03-03 DIAGNOSIS — Z79810 Long term (current) use of selective estrogen receptor modulators (SERMs): Secondary | ICD-10-CM | POA: Insufficient documentation

## 2022-03-03 DIAGNOSIS — Z170 Estrogen receptor positive status [ER+]: Secondary | ICD-10-CM | POA: Insufficient documentation

## 2022-03-03 DIAGNOSIS — Z923 Personal history of irradiation: Secondary | ICD-10-CM | POA: Insufficient documentation

## 2022-03-03 DIAGNOSIS — Z1501 Genetic susceptibility to malignant neoplasm of breast: Secondary | ICD-10-CM | POA: Insufficient documentation

## 2022-03-03 DIAGNOSIS — Z8041 Family history of malignant neoplasm of ovary: Secondary | ICD-10-CM | POA: Insufficient documentation

## 2022-03-03 DIAGNOSIS — Z9013 Acquired absence of bilateral breasts and nipples: Secondary | ICD-10-CM | POA: Diagnosis not present

## 2022-03-03 DIAGNOSIS — Z803 Family history of malignant neoplasm of breast: Secondary | ICD-10-CM | POA: Diagnosis not present

## 2022-03-03 DIAGNOSIS — R232 Flushing: Secondary | ICD-10-CM | POA: Diagnosis not present

## 2022-03-03 DIAGNOSIS — Z79899 Other long term (current) drug therapy: Secondary | ICD-10-CM | POA: Insufficient documentation

## 2022-03-03 DIAGNOSIS — C50412 Malignant neoplasm of upper-outer quadrant of left female breast: Secondary | ICD-10-CM | POA: Insufficient documentation

## 2022-03-03 DIAGNOSIS — Z9221 Personal history of antineoplastic chemotherapy: Secondary | ICD-10-CM | POA: Diagnosis not present

## 2022-03-03 DIAGNOSIS — Z1509 Genetic susceptibility to other malignant neoplasm: Secondary | ICD-10-CM | POA: Insufficient documentation

## 2022-03-04 LAB — CANCER ANTIGEN 27.29: CA 27.29: 18.4 U/mL (ref 0.0–38.6)

## 2022-05-04 ENCOUNTER — Other Ambulatory Visit: Payer: Self-pay | Admitting: Internal Medicine

## 2022-05-16 ENCOUNTER — Encounter: Payer: Self-pay | Admitting: Internal Medicine

## 2022-05-29 ENCOUNTER — Inpatient Hospital Stay: Payer: BC Managed Care – PPO | Attending: Internal Medicine

## 2022-05-29 DIAGNOSIS — Z9221 Personal history of antineoplastic chemotherapy: Secondary | ICD-10-CM | POA: Insufficient documentation

## 2022-05-29 DIAGNOSIS — Z170 Estrogen receptor positive status [ER+]: Secondary | ICD-10-CM | POA: Insufficient documentation

## 2022-05-29 DIAGNOSIS — Z8041 Family history of malignant neoplasm of ovary: Secondary | ICD-10-CM | POA: Insufficient documentation

## 2022-05-29 DIAGNOSIS — Z803 Family history of malignant neoplasm of breast: Secondary | ICD-10-CM | POA: Insufficient documentation

## 2022-05-29 DIAGNOSIS — R232 Flushing: Secondary | ICD-10-CM | POA: Diagnosis not present

## 2022-05-29 DIAGNOSIS — C50412 Malignant neoplasm of upper-outer quadrant of left female breast: Secondary | ICD-10-CM | POA: Insufficient documentation

## 2022-05-29 DIAGNOSIS — Z923 Personal history of irradiation: Secondary | ICD-10-CM | POA: Diagnosis not present

## 2022-05-29 DIAGNOSIS — Z9013 Acquired absence of bilateral breasts and nipples: Secondary | ICD-10-CM | POA: Diagnosis not present

## 2022-05-29 DIAGNOSIS — Z1501 Genetic susceptibility to malignant neoplasm of breast: Secondary | ICD-10-CM | POA: Diagnosis not present

## 2022-05-29 DIAGNOSIS — Z800 Family history of malignant neoplasm of digestive organs: Secondary | ICD-10-CM | POA: Insufficient documentation

## 2022-05-29 DIAGNOSIS — Z79899 Other long term (current) drug therapy: Secondary | ICD-10-CM | POA: Insufficient documentation

## 2022-05-29 DIAGNOSIS — Z1509 Genetic susceptibility to other malignant neoplasm: Secondary | ICD-10-CM | POA: Insufficient documentation

## 2022-05-29 DIAGNOSIS — Z79810 Long term (current) use of selective estrogen receptor modulators (SERMs): Secondary | ICD-10-CM | POA: Insufficient documentation

## 2022-05-29 LAB — CBC WITH DIFFERENTIAL/PLATELET
Abs Immature Granulocytes: 0 10*3/uL (ref 0.00–0.07)
Basophils Absolute: 0 10*3/uL (ref 0.0–0.1)
Basophils Relative: 1 %
Eosinophils Absolute: 0.1 10*3/uL (ref 0.0–0.5)
Eosinophils Relative: 2 %
HCT: 37.6 % (ref 36.0–46.0)
Hemoglobin: 12.6 g/dL (ref 12.0–15.0)
Immature Granulocytes: 0 %
Lymphocytes Relative: 29 %
Lymphs Abs: 1.6 10*3/uL (ref 0.7–4.0)
MCH: 28.8 pg (ref 26.0–34.0)
MCHC: 33.5 g/dL (ref 30.0–36.0)
MCV: 86 fL (ref 80.0–100.0)
Monocytes Absolute: 0.4 10*3/uL (ref 0.1–1.0)
Monocytes Relative: 7 %
Neutro Abs: 3.3 10*3/uL (ref 1.7–7.7)
Neutrophils Relative %: 61 %
Platelets: 219 10*3/uL (ref 150–400)
RBC: 4.37 MIL/uL (ref 3.87–5.11)
RDW: 13.5 % (ref 11.5–15.5)
WBC: 5.4 10*3/uL (ref 4.0–10.5)
nRBC: 0 % (ref 0.0–0.2)

## 2022-05-29 LAB — COMPREHENSIVE METABOLIC PANEL
ALT: 20 U/L (ref 0–44)
AST: 24 U/L (ref 15–41)
Albumin: 4.2 g/dL (ref 3.5–5.0)
Alkaline Phosphatase: 27 U/L — ABNORMAL LOW (ref 38–126)
Anion gap: 10 (ref 5–15)
BUN: 14 mg/dL (ref 6–20)
CO2: 27 mmol/L (ref 22–32)
Calcium: 9 mg/dL (ref 8.9–10.3)
Chloride: 100 mmol/L (ref 98–111)
Creatinine, Ser: 0.68 mg/dL (ref 0.44–1.00)
GFR, Estimated: >60 mL/min (ref >60)
Glucose, Bld: 91 mg/dL (ref 70–99)
Potassium: 3.9 mmol/L (ref 3.5–5.1)
Sodium: 137 mmol/L (ref 135–145)
Total Bilirubin: 0.9 mg/dL (ref 0.3–1.2)
Total Protein: 7.6 g/dL (ref 6.5–8.1)

## 2022-05-29 LAB — VITAMIN D 25 HYDROXY (VIT D DEFICIENCY, FRACTURES): Vit D, 25-Hydroxy: 55.98 ng/mL (ref 30–100)

## 2022-05-30 ENCOUNTER — Other Ambulatory Visit: Payer: BC Managed Care – PPO

## 2022-05-30 LAB — CANCER ANTIGEN 27.29: CA 27.29: 18.7 U/mL (ref 0.0–38.6)

## 2022-05-31 ENCOUNTER — Other Ambulatory Visit: Payer: Self-pay | Admitting: *Deleted

## 2022-05-31 DIAGNOSIS — Z170 Estrogen receptor positive status [ER+]: Secondary | ICD-10-CM

## 2022-06-01 ENCOUNTER — Inpatient Hospital Stay (HOSPITAL_BASED_OUTPATIENT_CLINIC_OR_DEPARTMENT_OTHER): Payer: BC Managed Care – PPO | Admitting: Internal Medicine

## 2022-06-01 ENCOUNTER — Encounter: Payer: Self-pay | Admitting: Internal Medicine

## 2022-06-01 ENCOUNTER — Inpatient Hospital Stay: Payer: BC Managed Care – PPO

## 2022-06-01 VITALS — BP 123/85 | HR 85 | Temp 99.0°F | Resp 16 | Ht 66.0 in | Wt 176.7 lb

## 2022-06-01 DIAGNOSIS — C50412 Malignant neoplasm of upper-outer quadrant of left female breast: Secondary | ICD-10-CM

## 2022-06-01 DIAGNOSIS — Z170 Estrogen receptor positive status [ER+]: Secondary | ICD-10-CM | POA: Diagnosis not present

## 2022-06-01 MED ORDER — ESCITALOPRAM OXALATE 20 MG PO TABS: 20.0000 mg | ORAL_TABLET | Freq: Every day | ORAL | 1 refills | Status: DC

## 2022-06-01 MED ORDER — SODIUM CHLORIDE 0.9 % IV SOLN
Freq: Once | INTRAVENOUS | Status: AC
  Filled 2022-06-01: qty 250

## 2022-06-01 MED ORDER — ZOLEDRONIC ACID 4 MG/100ML IV SOLN
4.0000 mg | Freq: Once | INTRAVENOUS | Status: AC
  Administered 2022-06-01: 4 mg via INTRAVENOUS
  Filled 2022-06-01: qty 100

## 2022-06-01 NOTE — Progress Notes (Signed)
Denise [name] NOTE  Patient Care Team: Venetia Maxon, Rolanda Jay, PA-C as PCP - General (Family Medicine) Bary Castilla, Forest Gleason, MD (General Surgery) Gae Dry, MD as Referring Physician (Obstetrics and Gynecology) Lequita Asal, MD (Inactive) as Referring Physician (Hematology and Oncology) Rico Junker, RN as Oncology Nurse Navigator Noreene Filbert, MD as Referring Physician (Radiation Oncology) [place] Pink, MD as Consulting Physician (Family Medicine) Cammie Sickle, MD as Consulting Physician (Oncology)  CHIEF COMPLAINTS/PURPOSE OF CONSULTATION: Breast cancer  #  Oncology History Overview Note  Denise [name] is a 46 y.o. 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  Pathology revealed no residual carcinoma in the breast.  One of three sentinel lymph nodes were positive.  One lymph node had 2 metastatic foci (2.1 mm and 1 mm).  Pathologic stage revealed ypT0 ypN1a.   Index breast mass and axillary node biopsy on 12/06/2017 revealed grade II invasive ductal carcinoma with calcifications at the 11 o'clock position.  There was metastatic carcinoma in 1 of 1 lymph nodes.  Tumor was ER + (100%), PR + (100%), and Ki67 5%.  Her2/neu was 3+ by IHC (heterogeneous) and FISH +.   Diagnostic left mammogram and ultrasound on 12/06/2017 revealed a 1.7 x 1.3 x 1.3 cm irregular hypoechoic mass with internal calcifications at the 11 o'clock position 2 cm from the nipple with internal calcifications. There was a similar appearing 0.9 x 0.8 x 0.7 cm mass at the 11 o'clock position 4 cm from the nipple (2.4 cm from the index lesion).  There were suspicious, segmental calcifications originating from the index lesion and extending 5 cm posteriorly.  There was a 0.6 cm morphologically abnormal left axillary lymph node.   Bilateral breast MRI on  12/10/2017 revealed a papilloma in the right breast  The recently biopsied malignancy in the left breast (15 x 16 x 16 mm) was identified. The adjacent and more superiorly located 11 o'clock mass (7 x 12 mm) seen on recent ultrasound, 4 cm from the nipple on ultrasound, was also identified. There were 2 probable satellite lesions (7 mm, medial lesion; posterior and lateral lesion). The total span of malignancy was 3 x 3.1 x 4.5 cm.  There were calcifications extending up to 5 cm posterior to the biopsied malignancy which were highly suspicious on mammography but not appreciated.  There was a mass in the lower outer left breast measured 5 mm, representing a change.  The known metastatic node in the left axilla was identified. A node along the posterolateral margin of the pectoralis minor (cortex 5 mm) was nonspecific but at least somewhat suspicious.   Chest, abdomen, and pelvic CT on 12/19/2017 revealed a solitary enlarged biopsy-proven metastatic left axillary lymph node.  There were no additional findings suspicious for metastatic disease in the chest, abdomen or pelvis.  There was a nonspecific tiny sclerotic upper right sacral lesion, more likely a benign bone island. Bone scintigraphy correlation versus follow-up CT could be considered.   Bone scan on 01/11/2018 was negative for metastatic pattern. No areas of focal tracer uptake. Area of concern in sacrum on CT likely represents benign bone island.    [facility] genetic testing on 08/26/2013 was negative for BRCA1 and 2. 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  She has a family history significant for breast, ovarian, pancreatic, and prostate cancer.   She received 6 cycles  of TCHP chemotherapy (12/21/2017 - 04/17/2018) with Margarette Canada support. Cycle #3 was held due to elevated LFTs on 02/04/2018. She received Herceptin + Perjeta alone (04/30/2018 - 05/30/2018).  She is s/p cycle #1 Kadcyla (06/20/2018).   She began breast  radiation on 06/25/2018    Echocardiograms:  12/20/2017 - EF of 60-65%, 03/27/2018 - EF of 60-65%, and 06/18/2018 - EF of 55-60%.   She has a history of elevated LFTs.  Hepatitis B and C serologies were negative on 02/04/2018.  RUQ ultrasound on 02/06/2018 revealed a small anterior gallbladder wall polyp. There was no evidence of cholelithiasis or cholecystis. CBD measured normal at 2 mm, with no evidence of choledocholithiasis. There was normal direction of blood flow towards the liver noted.   Carcinoma of upper-outer quadrant of left breast in female, estrogen receptor positive ([place])  09/17/2014 Initial Diagnosis   [name]   12/21/2017 - 04/18/2018 Chemotherapy   The patient had dexamethasone (DECADRON) 4 MG tablet, 8 mg, Oral, 2 times daily, 2 of 2 cycles palonosetron (ALOXI) injection 0.25 mg, 0.25 mg, Intravenous,  Once, 6 of 6 cycles Administration: 0.25 mg (12/21/2017), 0.25 mg (01/14/2018), 0.25 mg (03/28/2018), 0.25 mg (04/17/2018), 0.25 mg (02/11/2018), 0.25 mg (03/07/2018) pegfilgrastim (NEULASTA ONPRO KIT) injection 6 mg, 6 mg, Subcutaneous, Once, 1 of 1 cycle Administration: 6 mg (12/21/2017) pegfilgrastim-cbqv (UDENYCA) injection 6 mg, 6 mg, Subcutaneous, Once, 6 of 6 cycles Administration: 6 mg (01/15/2018), 6 mg (03/29/2018), 6 mg (04/18/2018), 6 mg (02/12/2018), 6 mg (03/08/2018) trastuzumab (HERCEPTIN) 600 mg in sodium chloride 0.9 % 250 mL chemo infusion, 672 mg, Intravenous,  Once, 6 of 6 cycles Administration: 600 mg (12/21/2017), 450 mg (01/14/2018), 500 mg (03/28/2018), 500 mg (04/17/2018), 500 mg (02/11/2018), 500 mg (03/07/2018) CARBOplatin (PARAPLATIN) 870 mg in sodium chloride 0.9 % 500 mL chemo infusion, 890 mg (100 % of original dose 885 mg), Intravenous,  Once, 6 of 6 cycles Dose modification:   (original dose 885 mg, Cycle 1),   (original dose 885 mg, Cycle 2),   (original dose 885 mg, Cycle 5),   (original dose 885 mg,  Cycle 6),   (original dose 885 mg, Cycle 3),   (original dose 885 mg, Cycle 4) Administration: 870 mg (12/21/2017), 870 mg (01/14/2018), 870 mg (03/28/2018), 870 mg (04/17/2018), 870 mg (02/11/2018), 870 mg (03/07/2018) DOCEtaxel (TAXOTERE) 150 mg in sodium chloride 0.9 % 250 mL chemo infusion, 75 mg/m2 = 150 mg, Intravenous,  Once, 6 of 6 cycles Dose modification: 55 mg/m2 (original dose 75 mg/m2, Cycle 5, Reason: Change in LFTs, Comment: per pharmacy) Administration: 150 mg (12/21/2017), 150 mg (01/14/2018), 110 mg (03/28/2018), 150 mg (04/17/2018), 150 mg (02/11/2018), 150 mg (03/07/2018) pertuzumab (PERJETA) 840 mg in sodium chloride 0.9 % 250 mL chemo infusion, 840 mg, Intravenous, Once, 6 of 6 cycles Administration: 840 mg (12/21/2017), 420 mg (01/14/2018), 420 mg (03/28/2018), 420 mg (04/17/2018), 420 mg (02/11/2018), 420 mg (03/07/2018) fosaprepitant (EMEND) 150 mg, dexamethasone (DECADRON) 12 mg in sodium chloride 0.9 % 145 mL IVPB, , Intravenous,  Once, 6 of 6 cycles Administration:  (12/21/2017),  (01/14/2018),  (03/28/2018),  (04/17/2018),  (02/11/2018),  (03/07/2018)  for chemotherapy treatment.    05/09/2018 - 05/30/2018 Chemotherapy   The patient had trastuzumab (HERCEPTIN) 500 mg in sodium chloride 0.9 % 250 mL chemo infusion, 504 mg, Intravenous,  Once, 2 of 11 cycles Administration: 500 mg (05/30/2018), 500 mg (05/09/2018) pertuzumab (PERJETA) 420 mg in sodium chloride 0.9 % 250 mL chemo infusion,  420 mg, Intravenous, Once, 2 of 11 cycles Administration: 420 mg (05/30/2018), 420 mg (05/09/2018)  for chemotherapy treatment.    01/17/2019 - 02/14/2019 Chemotherapy   Patient is on Treatment Plan : BREAST ([place]) Trastuzumab q21d       HISTORY OF PRESENTING ILLNESS: Ambulating independently.  Alone.  Denise [name] 46 y.o.  female with a history of stage Ib ER/PR positive HER2/neu positive breast cancer currently on tamoxifen is here for follow-up/review results of tumor marker.   July, 2023   patient weaned off Effexor [because of dizzyness. Pt was started on Escitalopram-tolerating well.  However continues to have hot flashes.  New night sweats at least twice a week for the past couple of weeks.   Patient denies any new onset of bone pain or joint pains.   Not any worse.  No shortness of breath or cough.  Denies any jaw pain.  Review of Systems  Constitutional:  Negative for chills, diaphoresis, fever, malaise/fatigue and weight loss.  HENT:  Negative for nosebleeds and sore throat.   Eyes:  Negative for double vision.  Respiratory:  Negative for cough, hemoptysis, sputum production, shortness of breath and wheezing.   Cardiovascular:  Negative for chest pain, palpitations, orthopnea and leg swelling.  Gastrointestinal:  Negative for abdominal pain, blood in stool, constipation, diarrhea, heartburn, melena, nausea and vomiting.  Genitourinary:  Negative for dysuria, frequency and urgency.  Musculoskeletal:  Negative for back pain and joint pain.  Skin: Negative.  Negative for itching and rash.  Neurological:  Negative for dizziness, tingling, focal weakness, weakness and headaches.  Endo/Heme/Allergies:  Does not bruise/bleed easily.  Psychiatric/Behavioral:  Negative for depression. The patient is not nervous/anxious and does not have insomnia.      MEDICAL HISTORY:  Past Medical History:  Diagnosis Date   Abnormal breast biopsy    [facility], [name]   Breast cancer [facility]) 11/2017   left breast; ER/PR/Her2neu POS   Family history of breast cancer    Family history of ovarian cancer    Fibroadenoma of breast, left 2000, 2016   Genetic screening 08/26/2013   BRCA/BART negative/Myriad, CHEK2 POS 2017   Hypertension    Increased risk of breast cancer 2017   IBIS=47%   Left breast lump 06/18/2017   Fluid/drained [name]   Migraine    Monoallelic mutation of CHEK2 gene in female patient 2017   increased risk of breast and colon cancer   Personal history of  chemotherapy    Personal history of radiation therapy     SURGICAL HISTORY: Past Surgical History:  Procedure Laterality Date   BREAST BIOPSY Bilateral 09/08/14   neg   BREAST BIOPSY Left 03/16/2015   Procedure: BREAST BIOPSY WITH NEEDLE LOCALIZATION;  Surgeon: [name], MD;  Location: [facility];  Service: General;  Laterality: Left;   BREAST BIOPSY Left 11/2017   positive   BREAST CYST ASPIRATION Left 06/18/2017   cyst aspiration   BREAST EXCISIONAL BIOPSY Left 09/2014   BREAST EXCISIONAL BIOPSY Right    age 13's   BREAST RECONSTRUCTION WITH PLACEMENT OF TISSUE EXPANDER AND FLEX HD (ACELLULAR HYDRATED DERMIS) Left 05/20/2018   Procedure: BREAST RECONSTRUCTION WITH PLACEMENT OF TISSUE EXPANDER AND FLEX HD (ACELLULAR HYDRATED DERMIS);  Surgeon: [name], DO;  Location: [facility];  Service: Plastics;  Laterality: Left;   BREAST RECONSTRUCTION WITH PLACEMENT OF TISSUE EXPANDER AND FLEX HD (ACELLULAR HYDRATED DERMIS) Right 12/09/2018   Procedure: BREAST RECONSTRUCTION WITH PLACEMENT OF TISSUE EXPANDER AND FLEX  HD (ACELLULAR HYDRATED DERMIS);  Surgeon: [name], DO;  Location: [facility];  Service: Plastics;  Laterality: Right;   BREAST SURGERY Right March 2000   benign fibroadenoma   BREAST SURGERY Left 08/08/13   excision   BREAST SURGERY Left 09/23/14   excision   COLONOSCOPY WITH PROPOFOL N/A 09/28/2021   Procedure: COLONOSCOPY WITH PROPOFOL;  Surgeon: [name], MD;  Location: [place];  Service: Endoscopy;  Laterality: N/A;   ESOPHAGOGASTRODUODENOSCOPY (EGD) WITH PROPOFOL N/A 01/20/2019   Procedure: ESOPHAGOGASTRODUODENOSCOPY (EGD) WITH PROPOFOL;  Surgeon: [name], MD;  Location: [place];  Service: Gastroenterology;  Laterality: N/A;   MASTECTOMY Left 05/20/2018   MASTECTOMY W/ SENTINEL NODE BIOPSY Left 05/20/2018   Procedure: MASTECTOMY WITH SENTINEL LYMPH NODE BIOPSY;  Surgeon: [name], MD;  Location: [facility];   Service: General;  Laterality: Left;   PORT-A-CATH REMOVAL Right 03/31/2019   Procedure: REMOVAL PORT-A-CATH;  Surgeon: [name], DO;  Location: [facility];  Service: Plastics;  Laterality: Right;   PORTACATH PLACEMENT Right 12/17/2017   Procedure: INSERTION PORT-A-CATH;  Surgeon: [name], MD;  Location: [facility];  Service: General;  Laterality: Right;   REMOVAL OF BILATERAL TISSUE EXPANDERS WITH PLACEMENT OF BILATERAL BREAST IMPLANTS Bilateral 03/31/2019   Procedure: REMOVAL OF BILATERAL TISSUE EXPANDERS WITH PLACEMENT OF BILATERAL BREAST IMPLANTS;  Surgeon: [name], DO;  Location: [facility];  Service: Plastics;  Laterality: Bilateral;  2 hours   SIMPLE MASTECTOMY WITH AXILLARY SENTINEL NODE BIOPSY Right 12/09/2018   Procedure: SIMPLE MASTECTOMY RIGHT;  Surgeon: [name], MD;  Location: [facility];  Service: General;  Laterality: Right;    SOCIAL HISTORY: Social History   Socioeconomic History   Marital status: Married    Spouse name: Not on file   Number of children: Not on file   Years of education: Not on file   Highest education level: Not on file  Occupational History   Not on file  Tobacco Use   Smoking status: Never   Smokeless tobacco: Never  Vaping Use   Vaping Use: Never used  Substance and Sexual Activity   Alcohol use: Yes    Comment: occasionally   Drug use: No   Sexual activity: Yes    Birth control/protection: I.U.D.    Comment: Paragard  Other Topics Concern   Not on file  Social History Narrative   Not on file   Social Determinants of Health   Financial Resource Strain: Low Risk  (01/08/2019)   Overall Financial Resource Strain (CARDIA)    Difficulty of Paying Living Expenses: Not very hard  Food Insecurity: No Food Insecurity (01/08/2019)   Hunger Vital Sign    Worried About Running Out of Food in the Last Year: Never true    Ran Out of Food in the Last Year: Never true  Transportation Needs: Unknown (01/08/2019)    PRAPARE - Hydrologist (Medical): No    Lack of Transportation (Non-Medical): Not on file  Physical Activity: Sufficiently Active (08/06/2017)   Exercise Vital Sign    Days of Exercise per Week: 3 days    Minutes of Exercise per Session: 60 min  Stress: No Stress Concern Present (08/06/2017)   [place]    Feeling of Stress : Only a little  Social Connections: Moderately Integrated (08/06/2017)   Social Connection and Isolation Panel [NHANES]    Frequency of Communication with Friends and  Family: Twice a week    Frequency of Social Gatherings with Friends and Family: Once a week    Attends Religious Services: More than 4 times per year    Active Member of [employer] or Organizations: No    Attends Archivist Meetings: Never    Marital Status: Married  Human resources officer Violence: Not At Risk (08/06/2017)   Humiliation, Afraid, Rape, and Kick questionnaire    Fear of Current or Ex-Partner: No    Emotionally Abused: No    Physically Abused: No    Sexually Abused: No    FAMILY HISTORY: Family History  Problem Relation Age of Onset   Skin cancer Mother    Prostate cancer Father 63       requiring tx/hormones   Depression Sister    Breast cancer Paternal Grandmother 72   Breast cancer Maternal Aunt 57   Ovarian cancer Maternal Aunt 70   Pancreatic cancer Maternal Aunt 75   Breast cancer Paternal Aunt 80   Stroke Maternal Grandmother    Stroke Maternal Grandfather    Alcohol abuse Paternal Grandfather    Prostate cancer Maternal Uncle    Alcohol abuse Paternal Uncle    Anxiety disorder Paternal Uncle     ALLERGIES:  is allergic to steri-strip compound benzoin [benzoin compound], meloxicam, silicone, and tape.  MEDICATIONS:  Current Outpatient Medications  Medication Sig Dispense Refill   acetaminophen (TYLENOL) 500 MG tablet Take 1,000 mg by mouth every 6 (six) hours as needed  for moderate pain or headache.      Calcium Carbonate (CALCIUM 600 PO) Take 1 tablet by mouth daily.     cholecalciferol (VITAMIN D3) 25 MCG (1000 UNIT) tablet Take 3,000 Units by mouth daily.     loratadine (CLARITIN) 10 MG tablet Take 10 mg by mouth daily.     tamoxifen (NOLVADEX) 20 MG tablet Take 1 tablet (20 mg total) by mouth daily. 90 tablet 1   vitamin C (ASCORBIC ACID) 500 MG tablet Take 500 mg by mouth daily.     escitalopram (LEXAPRO) 20 MG tablet Take 1 tablet (20 mg total) by mouth daily. 90 tablet 1   Current Facility-Administered Medications  Medication Dose Route Frequency Provider Last Rate Last Admin   paragard intrauterine copper IUD   Intrauterine Once Copland, Alicia B, PA-C       Facility-Administered Medications Ordered in Other Visits  Medication Dose Route Frequency Provider Last Rate Last Admin   sodium chloride flush (NS) 0.9 % injection 10 mL  10 mL Intravenous PRN Lequita Asal, MD   10 mL at 04/17/18 0844      .  PHYSICAL EXAMINATION: ECOG PERFORMANCE STATUS: 0 - Asymptomatic  Vitals:   06/01/22 1000  BP: 123/85  Pulse: 85  Resp: 16  Temp: 99 F (37.2 C)   Filed Weights   06/01/22 1000  Weight: 176 lb 11.2 oz (80.2 kg)    Physical Exam Vitals and nursing note reviewed.  Constitutional:      Comments: Alone; ambulating independently  HENT:     Head: Normocephalic and atraumatic.     Mouth/Throat:     Mouth: Mucous membranes are moist.     Pharynx: Oropharynx is clear. No oropharyngeal exudate.  Eyes:     Extraocular Movements: Extraocular movements intact.     Pupils: Pupils are equal, round, and reactive to light.  Cardiovascular:     Rate and Rhythm: Normal rate and regular rhythm.  Pulmonary:     Effort:  No respiratory distress.     Breath sounds: No wheezing.  Abdominal:     General: Bowel sounds are normal. There is no distension.     Palpations: Abdomen is soft. There is no mass.     Tenderness: There is no abdominal  tenderness. There is no guarding or rebound.  Musculoskeletal:        General: No tenderness. Normal range of motion.     Cervical back: Normal range of motion and neck supple.  Skin:    General: Skin is warm.  Neurological:     General: No focal deficit present.     Mental Status: She is alert and oriented to person, place, and time.  Psychiatric:        Mood and Affect: Affect normal.        Behavior: Behavior normal.        Judgment: Judgment normal.     Latest Reference Range & Units Most Recent 05/09/19 09:50 08/12/19 08:18 12/04/19 13:10 04/08/20 13:51 08/05/20 13:29 01/04/21 10:05 04/22/21 09:58 08/19/21 10:45  CA 27.29 0.0 - 38.6 U/mL 22.9 08/19/21 10:45 21.9 19.9 18.3 16.8 17.7 22.9 19.7 22.9    LABORATORY DATA:  I have reviewed the data as listed Lab Results  Component Value Date   WBC 5.4 05/29/2022   HGB 12.6 05/29/2022   HCT 37.6 05/29/2022   MCV 86.0 05/29/2022   PLT 219 05/29/2022   Recent Labs    08/19/21 1045 11/29/21 1135 05/29/22 1054  NA 137 136 137  K 3.8 3.9 3.9  CL 102 101 100  CO2 _0 GLUCOSE 110* 87 91  BUN _1 CREATININE 0.65 0.74 0.68  CALCIUM 9.1 8.7* 9.0  GFRNONAA >60 >60 >60  PROT 7.6 7.6 7.6  ALBUMIN 4.2 4.3 4.2  AST _2 ALT _3 ALKPHOS 28* 32* 27*  BILITOT 0.7 1.0 0.9    RADIOGRAPHIC STUDIES: I have personally reviewed the radiological images as listed and agreed with the findings in the report. No results found.  ASSESSMENT & PLAN:   Carcinoma of upper-outer quadrant of left breast in female, estrogen receptor positive ([place]) # 2019 Stage IB multifocal LEFT breast cancer ER-positive; Her2 positive- on Tamoxifen [? Fall 2020].  S/p left mastectomy; right prophylactic mastectomy. July 2022 CT scan chest and pelvis negative for any metastatic disease; bilateral mastectomies and mild scarring noted in the left upper lobe/radiation-induced likely.   STABLE.   #Patient continues to be on adjuvant tamoxifen.   No concerns for any recurrence. Continue Zometa adjuvantly every 6 months x3 years. [#1 on Nov 14th, 2022].   #Tumor marker: Within normal limits; however up-and-down.  Informed the patient that this is unlikely to be concerning.  Would recommend holding off on imaging unless significantly elevated; or an abnormal trend noted.    # Hot flashes-G-1-2; patient previously on Effexor [dizziness]; currently on Lexapro 10 mg a day.  Given the continued hot flashes recommend increasing the dose to 20 mg a day.  #  Premenopausal [July 5361]-WERXVQ ultrasound[IUD-cystic changes in the endometrium] in place; yearly follow-up with gynecology; STABLE.   # CHEK- 2 mutations: discussed re: colonoscopy starting 40 years;/sp  April 11th- colonoscopy- NEG [Dr.Byrnett]-reviewed normal.  # Screening for osteoporosis-JUNE 2023- The BMDT-score of 0.0.  Normal continue calcium plus vitamin D [? 600 u].   my chart # DISPOSITION: # zometa today # follow up in 6 months- MD; 3 days prior-labs-cbc/cmp/ca-27-29;Vit D 25OH  levels; Zometa ;  priorDr.B      All questions were answered. The patient knows to call the clinic with any problems, questions or concerns.       Cammie Sickle, MD 06/01/2022 11:21 AM

## 2022-06-01 NOTE — Assessment & Plan Note (Addendum)
#  2019 Stage IB multifocal LEFT breast cancer ER-positive; Her2 positive- on Tamoxifen [? Fall 2020].  S/p left mastectomy; right prophylactic mastectomy. July 2022 CT scan chest and pelvis negative for any metastatic disease; bilateral mastectomies and mild scarring noted in the left upper lobe/radiation-induced likely.   STABLE.   #Patient continues to be on adjuvant tamoxifen.  No concerns for any recurrence. Continue Zometa adjuvantly every 6 months x3 years. [#1 on Nov 14th, 2022].  No jaw pain.  Calcium normal.  #Tumor marker: Within normal limits; however up-and-down.  Informed the patient that this is unlikely to be concerning.  Would recommend holding off on imaging unless significantly elevated; or an abnormal trend noted.    # Hot flashes-G-1-2; patient previously on Effexor [dizziness]; currently on Lexapro 10 mg a day.  Given the continued hot flashes recommend increasing the dose to 20 mg a day.  #  Premenopausal [July 8329]-VBTYOM ultrasound[IUD-cystic changes in the endometrium] in place; yearly follow-up with gynecology; STABLE.   # CHEK- 2 mutations: discussed re: colonoscopy starting 40 years;/sp  April 11th- colonoscopy- NEG [Dr.Byrnett]-reviewed normal.  # Screening for osteoporosis-JUNE 2023- The BMDT-score of 0.0.  Normal continue calcium plus vitamin D [? 600 u].   my chart # DISPOSITION: # zometa today # follow up in 6 months- MD; 3 days prior-labs-cbc/cmp/ca-27-29;Vit D 25OH levels; Zometa ;  priorDr.B

## 2022-06-01 NOTE — Progress Notes (Signed)
New night sweats at least twice a week for the past couple of weeks.  Has stopped Effexor and started on Escitalopram.

## 2022-06-01 NOTE — Patient Instructions (Signed)

## 2022-06-16 ENCOUNTER — Other Ambulatory Visit: Payer: Self-pay | Admitting: Internal Medicine

## 2022-06-16 DIAGNOSIS — C50419 Malignant neoplasm of upper-outer quadrant of unspecified female breast: Secondary | ICD-10-CM

## 2022-09-13 ENCOUNTER — Other Ambulatory Visit: Payer: Self-pay | Admitting: Internal Medicine

## 2022-09-13 DIAGNOSIS — C50419 Malignant neoplasm of upper-outer quadrant of unspecified female breast: Secondary | ICD-10-CM

## 2022-11-28 ENCOUNTER — Inpatient Hospital Stay: Payer: BC Managed Care – PPO | Attending: Internal Medicine

## 2022-11-28 DIAGNOSIS — R42 Dizziness and giddiness: Secondary | ICD-10-CM | POA: Diagnosis not present

## 2022-11-28 DIAGNOSIS — Z800 Family history of malignant neoplasm of digestive organs: Secondary | ICD-10-CM | POA: Diagnosis not present

## 2022-11-28 DIAGNOSIS — Z1501 Genetic susceptibility to malignant neoplasm of breast: Secondary | ICD-10-CM | POA: Diagnosis not present

## 2022-11-28 DIAGNOSIS — C50412 Malignant neoplasm of upper-outer quadrant of left female breast: Secondary | ICD-10-CM | POA: Insufficient documentation

## 2022-11-28 DIAGNOSIS — Z9013 Acquired absence of bilateral breasts and nipples: Secondary | ICD-10-CM | POA: Diagnosis not present

## 2022-11-28 DIAGNOSIS — R232 Flushing: Secondary | ICD-10-CM | POA: Diagnosis not present

## 2022-11-28 DIAGNOSIS — Z8041 Family history of malignant neoplasm of ovary: Secondary | ICD-10-CM | POA: Diagnosis not present

## 2022-11-28 DIAGNOSIS — E611 Iron deficiency: Secondary | ICD-10-CM | POA: Diagnosis not present

## 2022-11-28 DIAGNOSIS — Z79810 Long term (current) use of selective estrogen receptor modulators (SERMs): Secondary | ICD-10-CM | POA: Insufficient documentation

## 2022-11-28 DIAGNOSIS — Z1502 Genetic susceptibility to malignant neoplasm of ovary: Secondary | ICD-10-CM | POA: Insufficient documentation

## 2022-11-28 DIAGNOSIS — Z923 Personal history of irradiation: Secondary | ICD-10-CM | POA: Insufficient documentation

## 2022-11-28 DIAGNOSIS — Z9221 Personal history of antineoplastic chemotherapy: Secondary | ICD-10-CM | POA: Diagnosis not present

## 2022-11-28 DIAGNOSIS — G5600 Carpal tunnel syndrome, unspecified upper limb: Secondary | ICD-10-CM | POA: Insufficient documentation

## 2022-11-28 DIAGNOSIS — Z803 Family history of malignant neoplasm of breast: Secondary | ICD-10-CM | POA: Insufficient documentation

## 2022-11-28 DIAGNOSIS — Z79899 Other long term (current) drug therapy: Secondary | ICD-10-CM | POA: Diagnosis not present

## 2022-11-28 DIAGNOSIS — Z170 Estrogen receptor positive status [ER+]: Secondary | ICD-10-CM | POA: Insufficient documentation

## 2022-11-28 DIAGNOSIS — Z1509 Genetic susceptibility to other malignant neoplasm: Secondary | ICD-10-CM | POA: Diagnosis not present

## 2022-11-28 LAB — CBC WITH DIFFERENTIAL/PLATELET
Abs Immature Granulocytes: 0.01 10*3/uL (ref 0.00–0.07)
Basophils Absolute: 0 10*3/uL (ref 0.0–0.1)
Basophils Relative: 1 %
Eosinophils Absolute: 0.1 10*3/uL (ref 0.0–0.5)
Eosinophils Relative: 2 %
HCT: 34.2 % — ABNORMAL LOW (ref 36.0–46.0)
Hemoglobin: 11.4 g/dL — ABNORMAL LOW (ref 12.0–15.0)
Immature Granulocytes: 0 %
Lymphocytes Relative: 33 %
Lymphs Abs: 2.1 10*3/uL (ref 0.7–4.0)
MCH: 28.6 pg (ref 26.0–34.0)
MCHC: 33.3 g/dL (ref 30.0–36.0)
MCV: 85.9 fL (ref 80.0–100.0)
Monocytes Absolute: 0.6 10*3/uL (ref 0.1–1.0)
Monocytes Relative: 10 %
Neutro Abs: 3.4 10*3/uL (ref 1.7–7.7)
Neutrophils Relative %: 54 %
Platelets: 211 10*3/uL (ref 150–400)
RBC: 3.98 MIL/uL (ref 3.87–5.11)
RDW: 13.3 % (ref 11.5–15.5)
WBC: 6.3 10*3/uL (ref 4.0–10.5)
nRBC: 0 % (ref 0.0–0.2)

## 2022-11-28 LAB — COMPREHENSIVE METABOLIC PANEL
ALT: 18 U/L (ref 0–44)
AST: 22 U/L (ref 15–41)
Albumin: 3.9 g/dL (ref 3.5–5.0)
Alkaline Phosphatase: 26 U/L — ABNORMAL LOW (ref 38–126)
Anion gap: 9 (ref 5–15)
BUN: 12 mg/dL (ref 6–20)
CO2: 26 mmol/L (ref 22–32)
Calcium: 8.9 mg/dL (ref 8.9–10.3)
Chloride: 104 mmol/L (ref 98–111)
Creatinine, Ser: 0.89 mg/dL (ref 0.44–1.00)
GFR, Estimated: >60 mL/min (ref >60)
Glucose, Bld: 96 mg/dL (ref 70–99)
Potassium: 4 mmol/L (ref 3.5–5.1)
Sodium: 139 mmol/L (ref 135–145)
Total Bilirubin: 0.4 mg/dL (ref 0.3–1.2)
Total Protein: 6.9 g/dL (ref 6.5–8.1)

## 2022-11-28 LAB — VITAMIN D 25 HYDROXY (VIT D DEFICIENCY, FRACTURES): Vit D, 25-Hydroxy: 48.03 ng/mL (ref 30–100)

## 2022-11-29 LAB — CANCER ANTIGEN 27.29: CA 27.29: 14 U/mL (ref 0.0–38.6)

## 2022-12-01 ENCOUNTER — Inpatient Hospital Stay (HOSPITAL_BASED_OUTPATIENT_CLINIC_OR_DEPARTMENT_OTHER): Payer: BC Managed Care – PPO | Admitting: Internal Medicine

## 2022-12-01 ENCOUNTER — Inpatient Hospital Stay: Payer: BC Managed Care – PPO

## 2022-12-01 ENCOUNTER — Encounter: Payer: Self-pay | Admitting: Internal Medicine

## 2022-12-01 DIAGNOSIS — Z170 Estrogen receptor positive status [ER+]: Secondary | ICD-10-CM

## 2022-12-01 DIAGNOSIS — C50412 Malignant neoplasm of upper-outer quadrant of left female breast: Secondary | ICD-10-CM | POA: Diagnosis not present

## 2022-12-01 MED ORDER — ZOLEDRONIC ACID 4 MG/100ML IV SOLN
4.0000 mg | Freq: Once | INTRAVENOUS | Status: AC
  Administered 2022-12-01: 4 mg via INTRAVENOUS
  Filled 2022-12-01: qty 100

## 2022-12-01 MED ORDER — SODIUM CHLORIDE 0.9 % IV SOLN
Freq: Once | INTRAVENOUS | Status: AC
  Filled 2022-12-01: qty 250

## 2022-12-01 NOTE — Progress Notes (Signed)
No concerns today 

## 2022-12-01 NOTE — Patient Instructions (Signed)
#  Recommend gentle iron [iron biglycinate; 28 mg ] 1 pill a day.  This pill is unlikely to cause stomach upset or cause constipation.  

## 2022-12-01 NOTE — Progress Notes (Signed)
[name]  Patient Care Team: Venetia Maxon, Rolanda Jay, PA-C as PCP - General (Family Medicine) Bary Castilla, Forest Gleason, MD (General Surgery) Gae Dry, MD as Referring Physician (Obstetrics and Gynecology) Lequita Asal, MD (Inactive) as Referring Physician (Hematology and Oncology) Rico Junker, RN as Oncology Nurse Navigator Noreene Filbert, MD as Referring Physician (Radiation Oncology) [place] Pink, MD as Consulting Physician (Family Medicine) Cammie Sickle, MD as Consulting Physician (Oncology)  CHIEF COMPLAINTS/PURPOSE OF CONSULTATION: Breast cancer  #  Oncology History Overview Note  Denise [name] is a 47 y.o. 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  Pathology revealed no residual carcinoma in the breast.  One of three sentinel lymph nodes were positive.  One lymph node had 2 metastatic foci (2.1 mm and 1 mm).  Pathologic stage revealed ypT0 ypN1a.   Index breast mass and axillary node biopsy on 12/06/2017 revealed grade II invasive ductal carcinoma with calcifications at the 11 o'clock position.  There was metastatic carcinoma in 1 of 1 lymph nodes.  Tumor was ER + (100%), PR + (100%), and Ki67 5%.  Her2/neu was 3+ by IHC (heterogeneous) and FISH +.   Diagnostic left mammogram and ultrasound on 12/06/2017 revealed a 1.7 x 1.3 x 1.3 cm irregular hypoechoic mass with internal calcifications at the 11 o'clock position 2 cm from the nipple with internal calcifications. There was a similar appearing 0.9 x 0.8 x 0.7 cm mass at the 11 o'clock position 4 cm from the nipple (2.4 cm from the index lesion).  There were suspicious, segmental calcifications originating from the index lesion and extending 5 cm posteriorly.  There was a 0.6 cm morphologically abnormal left axillary lymph node.   Bilateral breast MRI on  12/10/2017 revealed a papilloma in the right breast  The recently biopsied malignancy in the left breast (15 x 16 x 16 mm) was identified. The adjacent and more superiorly located 11 o'clock mass (7 x 12 mm) seen on recent ultrasound, 4 cm from the nipple on ultrasound, was also identified. There were 2 probable satellite lesions (7 mm, medial lesion; posterior and lateral lesion). The total span of malignancy was 3 x 3.1 x 4.5 cm.  There were calcifications extending up to 5 cm posterior to the biopsied malignancy which were highly suspicious on mammography but not appreciated.  There was a mass in the lower outer left breast measured 5 mm, representing a change.  The known metastatic node in the left axilla was identified. A node along the posterolateral margin of the pectoralis minor (cortex 5 mm) was nonspecific but at least somewhat suspicious.   Chest, abdomen, and pelvic CT on 12/19/2017 revealed a solitary enlarged biopsy-proven metastatic left axillary lymph node.  There were no additional findings suspicious for metastatic disease in the chest, abdomen or pelvis.  There was a nonspecific tiny sclerotic upper right sacral lesion, more likely a benign bone island. Bone scintigraphy correlation versus follow-up CT could be considered.   Bone scan on 01/11/2018 was negative for metastatic pattern. No areas of focal tracer uptake. Area of concern in sacrum on CT likely represents benign bone island.    Myriad genetic testing on 08/26/2013 was negative for BRCA1 and 2. 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  She has a family history significant for breast, ovarian, pancreatic, and prostate cancer.   She received 6 cycles  of TCHP chemotherapy (12/21/2017 - 04/17/2018) with Margarette Canada support. Cycle #3 was held due to elevated LFTs on 02/04/2018. She received Herceptin + Perjeta alone (04/30/2018 - 05/30/2018).  She is s/p cycle #1 Kadcyla (06/20/2018).   She began breast  radiation on 06/25/2018    Echocardiograms:  12/20/2017 - EF of 60-65%, 03/27/2018 - EF of 60-65%, and 06/18/2018 - EF of 55-60%.   She has a history of elevated LFTs.  Hepatitis B and C serologies were negative on 02/04/2018.  RUQ ultrasound on 02/06/2018 revealed a small anterior gallbladder wall polyp. There was no evidence of cholelithiasis or cholecystis. CBD measured normal at 2 mm, with no evidence of choledocholithiasis. There was normal direction of blood flow towards the liver noted.   Carcinoma of upper-outer quadrant of left breast in female, estrogen receptor positive ([facility])  09/17/2014 Initial Diagnosis   [name]   12/21/2017 - 04/18/2018 Chemotherapy   The patient had dexamethasone (DECADRON) 4 MG tablet, 8 mg, Oral, 2 times daily, 2 of 2 cycles palonosetron (ALOXI) injection 0.25 mg, 0.25 mg, Intravenous,  Once, 6 of 6 cycles Administration: 0.25 mg (12/21/2017), 0.25 mg (01/14/2018), 0.25 mg (03/28/2018), 0.25 mg (04/17/2018), 0.25 mg (02/11/2018), 0.25 mg (03/07/2018) pegfilgrastim (NEULASTA ONPRO KIT) injection 6 mg, 6 mg, Subcutaneous, Once, 1 of 1 cycle Administration: 6 mg (12/21/2017) pegfilgrastim-cbqv (UDENYCA) injection 6 mg, 6 mg, Subcutaneous, Once, 6 of 6 cycles Administration: 6 mg (01/15/2018), 6 mg (03/29/2018), 6 mg (04/18/2018), 6 mg (02/12/2018), 6 mg (03/08/2018) trastuzumab (HERCEPTIN) 600 mg in sodium chloride 0.9 % 250 mL chemo infusion, 672 mg, Intravenous,  Once, 6 of 6 cycles Administration: 600 mg (12/21/2017), 450 mg (01/14/2018), 500 mg (03/28/2018), 500 mg (04/17/2018), 500 mg (02/11/2018), 500 mg (03/07/2018) CARBOplatin (PARAPLATIN) 870 mg in sodium chloride 0.9 % 500 mL chemo infusion, 890 mg (100 % of original dose 885 mg), Intravenous,  Once, 6 of 6 cycles Dose modification:   (original dose 885 mg, Cycle 1),   (original dose 885 mg, Cycle 2),   (original dose 885 mg, Cycle 5),   (original dose 885 mg,  Cycle 6),   (original dose 885 mg, Cycle 3),   (original dose 885 mg, Cycle 4) Administration: 870 mg (12/21/2017), 870 mg (01/14/2018), 870 mg (03/28/2018), 870 mg (04/17/2018), 870 mg (02/11/2018), 870 mg (03/07/2018) DOCEtaxel (TAXOTERE) 150 mg in sodium chloride 0.9 % 250 mL chemo infusion, 75 mg/m2 = 150 mg, Intravenous,  Once, 6 of 6 cycles Dose modification: 55 mg/m2 (original dose 75 mg/m2, Cycle 5, Reason: Change in LFTs, Comment: per pharmacy) Administration: 150 mg (12/21/2017), 150 mg (01/14/2018), 110 mg (03/28/2018), 150 mg (04/17/2018), 150 mg (02/11/2018), 150 mg (03/07/2018) pertuzumab (PERJETA) 840 mg in sodium chloride 0.9 % 250 mL chemo infusion, 840 mg, Intravenous, Once, 6 of 6 cycles Administration: 840 mg (12/21/2017), 420 mg (01/14/2018), 420 mg (03/28/2018), 420 mg (04/17/2018), 420 mg (02/11/2018), 420 mg (03/07/2018) fosaprepitant (EMEND) 150 mg, dexamethasone (DECADRON) 12 mg in sodium chloride 0.9 % 145 mL IVPB, , Intravenous,  Once, 6 of 6 cycles Administration:  (12/21/2017),  (01/14/2018),  (03/28/2018),  (04/17/2018),  (02/11/2018),  (03/07/2018)  for chemotherapy treatment.    05/09/2018 - 05/30/2018 Chemotherapy   The patient had trastuzumab (HERCEPTIN) 500 mg in sodium chloride 0.9 % 250 mL chemo infusion, 504 mg, Intravenous,  Once, 2 of 11 cycles Administration: 500 mg (05/30/2018), 500 mg (05/09/2018) pertuzumab (PERJETA) 420 mg in sodium chloride 0.9 % 250 mL chemo infusion,  420 mg, Intravenous, Once, 2 of 11 cycles Administration: 420 mg (05/30/2018), 420 mg (05/09/2018)  for chemotherapy treatment.    01/17/2019 - 02/14/2019 Chemotherapy   Patient is on Treatment Plan : BREAST ([place]) Trastuzumab q21d       HISTORY OF PRESENTING ILLNESS: Ambulating independently.  Alone.  AMADEA VIK 47 y.o.  female with a history of stage Ib ER/PR positive HER2/neu positive breast cancer currently on tamoxifen- on zometa q 6 M adjuvant is here for follow-up/review results of  tumor marker.  Patient doing well. Intermittent  hot flashes. She continues to have Escitalopram. Continues to be compliance with Tamoxifen.   Patient denies any new onset of bone pain or joint pains.   Not any worse.  No shortness of breath or cough.  Denies any jaw pain.  Review of Systems  Constitutional:  Negative for chills, diaphoresis, fever, malaise/fatigue and weight loss.  HENT:  Negative for nosebleeds and sore throat.   Eyes:  Negative for double vision.  Respiratory:  Negative for cough, hemoptysis, sputum production, shortness of breath and wheezing.   Cardiovascular:  Negative for chest pain, palpitations, orthopnea and leg swelling.  Gastrointestinal:  Negative for abdominal pain, blood in stool, constipation, diarrhea, heartburn, melena, nausea and vomiting.  Genitourinary:  Negative for dysuria, frequency and urgency.  Musculoskeletal:  Negative for back pain and joint pain.  Skin: Negative.  Negative for itching and rash.  Neurological:  Negative for dizziness, tingling, focal weakness, weakness and headaches.  Endo/Heme/Allergies:  Does not bruise/bleed easily.  Psychiatric/Behavioral:  Negative for depression. The patient is not nervous/anxious and does not have insomnia.      MEDICAL HISTORY:  Past Medical History:  Diagnosis Date   Abnormal breast biopsy    PASH, Dr. Lemar Livings   Breast cancer [facility]) 11/2017   left breast; ER/PR/Her2neu POS   Family history of breast cancer    Family history of ovarian cancer    Fibroadenoma of breast, left 2000, 2016   Genetic screening 08/26/2013   BRCA/BART negative/Myriad, CHEK2 POS 2017   Hypertension    Increased risk of breast cancer 2017   IBIS=47%   Left breast lump 06/18/2017   Fluid/drained [name]   Migraine    Monoallelic mutation of CHEK2 gene in female patient 2017   increased risk of breast and colon cancer   Personal history of chemotherapy    Personal history of radiation therapy     SURGICAL  HISTORY: Past Surgical History:  Procedure Laterality Date   BREAST BIOPSY Bilateral 09/08/14   neg   BREAST BIOPSY Left 03/16/2015   Procedure: BREAST BIOPSY WITH NEEDLE LOCALIZATION;  Surgeon: Earline Mayotte, MD;  Location: [facility] ORS;  Service: General;  Laterality: Left;   BREAST BIOPSY Left 11/2017   positive   BREAST CYST ASPIRATION Left 06/18/2017   cyst aspiration   BREAST EXCISIONAL BIOPSY Left 09/2014   BREAST EXCISIONAL BIOPSY Right    age 40's   BREAST RECONSTRUCTION WITH PLACEMENT OF TISSUE EXPANDER AND FLEX HD (ACELLULAR HYDRATED DERMIS) Left 05/20/2018   Procedure: BREAST RECONSTRUCTION WITH PLACEMENT OF TISSUE EXPANDER AND FLEX HD (ACELLULAR HYDRATED DERMIS);  Surgeon: Peggye Form, DO;  Location: [facility] ORS;  Service: Plastics;  Laterality: Left;   BREAST RECONSTRUCTION WITH PLACEMENT OF TISSUE EXPANDER AND FLEX HD (ACELLULAR HYDRATED DERMIS) Right 12/09/2018   Procedure: BREAST RECONSTRUCTION WITH PLACEMENT OF TISSUE EXPANDER AND FLEX HD (ACELLULAR HYDRATED DERMIS);  Surgeon: Peggye Form, DO;  Location: [facility] ORS;  Service: Government social research officer;  Laterality: Right;   BREAST SURGERY Right March 2000   benign fibroadenoma   BREAST SURGERY Left 08/08/13   excision   BREAST SURGERY Left 09/23/14   excision   COLONOSCOPY WITH PROPOFOL N/A 09/28/2021   Procedure: COLONOSCOPY WITH PROPOFOL;  Surgeon: Earline Mayotte, MD;  Location: [facility] ENDOSCOPY;  Service: Endoscopy;  Laterality: N/A;   ESOPHAGOGASTRODUODENOSCOPY (EGD) WITH PROPOFOL N/A 01/20/2019   Procedure: ESOPHAGOGASTRODUODENOSCOPY (EGD) WITH PROPOFOL;  Surgeon: Toney Reil, MD;  Location: [facility] ENDOSCOPY;  Service: Gastroenterology;  Laterality: N/A;   MASTECTOMY Left 05/20/2018   MASTECTOMY W/ SENTINEL NODE BIOPSY Left 05/20/2018   Procedure: MASTECTOMY WITH SENTINEL LYMPH NODE BIOPSY;  Surgeon: Earline Mayotte, MD;  Location: [facility] ORS;  Service: General;  Laterality: Left;   PORT-A-CATH REMOVAL Right 03/31/2019    Procedure: REMOVAL PORT-A-CATH;  Surgeon: Peggye Form, DO;  Location: [facility] OR[facility];  Service: Plastics;  Laterality: Right;   PORTACATH PLACEMENT Right 12/17/2017   Procedure: INSERTION PORT-A-CATH;  Surgeon: Earline Mayotte, MD;  Location: [facility] ORS;  Service: General;  Laterality: Right;   REMOVAL OF BILATERAL TISSUE EXPANDERS WITH PLACEMENT OF BILATERAL BREAST IMPLANTS Bilateral 03/31/2019   Procedure: REMOVAL OF BILATERAL TISSUE EXPANDERS WITH PLACEMENT OF BILATERAL BREAST IMPLANTS;  Surgeon: Peggye Form, DO;  Location: [facility] ORS;  Service: Plastics;  Laterality: Bilateral;  2 hours   SIMPLE MASTECTOMY WITH AXILLARY SENTINEL NODE BIOPSY Right 12/09/2018   Procedure: SIMPLE MASTECTOMY RIGHT;  Surgeon: Earline Mayotte, MD;  Location: [facility] ORS;  Service: General;  Laterality: Right;    SOCIAL HISTORY: Social History   Socioeconomic History   Marital status: Married    Spouse name: Not on file   Number of children: Not on file   Years of education: Not on file   Highest education level: Not on file  Occupational History   Not on file  Tobacco Use   Smoking status: Never   Smokeless tobacco: Never  Vaping Use   Vaping Use: Never used  Substance and Sexual Activity   Alcohol use: Yes    Comment: occasionally   Drug use: No   Sexual activity: Yes    Birth control/protection: I.U.D.    Comment: Paragard  Other Topics Concern   Not on file  Social History Narrative   Not on file   Social Determinants of Health   Financial Resource Strain: Low Risk  (01/08/2019)   Overall Financial Resource Strain (CARDIA)    Difficulty of Paying Living Expenses: Not very hard  Food Insecurity: No Food Insecurity (01/08/2019)   Hunger Vital Sign    Worried About Running Out of Food in the Last Year: Never true    Ran Out of Food in the Last Year: Never true  Transportation Needs: Unknown (01/08/2019)   PRAPARE - Administrator, Civil Service (Medical): No    Lack  of Transportation (Non-Medical): Not on file  Physical Activity: Sufficiently Active (08/06/2017)   Exercise Vital Sign    Days of Exercise per Week: 3 days    Minutes of Exercise per Session: 60 min  Stress: No Stress Concern Present (08/06/2017)   [employer] of Occupational Health - Occupational Stress Questionnaire    Feeling of Stress : Only a little  Social Connections: Moderately Integrated (08/06/2017)   Social Connection and Isolation Panel [NHANES]    Frequency of Communication with Friends and Family: Twice a week    Frequency of Social Gatherings with Friends and Family:  Once a week    Attends Religious Services: More than 4 times per year    Active Member of Clubs or Organizations: No    Attends Banker Meetings: Never    Marital Status: Married  Catering manager Violence: Not At Risk (08/06/2017)   Humiliation, Afraid, Rape, and Kick questionnaire    Fear of Current or Ex-Partner: No    Emotionally Abused: No    Physically Abused: No    Sexually Abused: No    FAMILY HISTORY: Family History  Problem Relation Age of Onset   Skin cancer Mother    Prostate cancer Father 57       requiring tx/hormones   Depression Sister    Breast cancer Paternal Grandmother 49   Breast cancer Maternal Aunt 60   Ovarian cancer Maternal Aunt 70   Pancreatic cancer Maternal Aunt 75   Breast cancer Paternal Aunt 69   Stroke Maternal Grandmother    Stroke Maternal Grandfather    Alcohol abuse Paternal Grandfather    Prostate cancer Maternal Uncle    Alcohol abuse Paternal Uncle    Anxiety disorder Paternal Uncle     ALLERGIES:  is allergic to steri-strip compound benzoin [benzoin compound], meloxicam, silicone, and tape.  MEDICATIONS:  Current Outpatient Medications  Medication Sig Dispense Refill   acetaminophen (TYLENOL) 500 MG tablet Take 1,000 mg by mouth every 6 (six) hours as needed for moderate pain or headache.      Calcium Carbonate (CALCIUM 600 PO) Take  1 tablet by mouth daily.     cholecalciferol (VITAMIN D3) 25 MCG (1000 UNIT) tablet Take 3,000 Units by mouth daily.     escitalopram (LEXAPRO) 20 MG tablet Take 1 tablet (20 mg total) by mouth daily. 90 tablet 1   tamoxifen (NOLVADEX) 20 MG tablet Take 1 tablet by mouth once daily 90 tablet 0   vitamin C (ASCORBIC ACID) 500 MG tablet Take 500 mg by mouth daily.     loratadine (CLARITIN) 10 MG tablet Take 10 mg by mouth daily. (Patient not taking: Reported on 12/01/2022)     Current Facility-Administered Medications  Medication Dose Route Frequency Provider Last Rate Last Admin   paragard intrauterine copper IUD   Intrauterine Once Copland, Alicia B, PA-C       Facility-Administered Medications Ordered in Other Visits  Medication Dose Route Frequency Provider Last Rate Last Admin   sodium chloride flush (NS) 0.9 % injection 10 mL  10 mL Intravenous PRN Nelva Nay C, MD   10 mL at 04/17/18 0844   Zoledronic Acid (ZOMETA) IVPB 4 mg  4 mg Intravenous Once Louretta Shorten R, MD 400 mL/hr at 12/01/22 1339 4 mg at 12/01/22 1339      .  PHYSICAL EXAMINATION: ECOG PERFORMANCE STATUS: 0 - Asymptomatic  Vitals:   12/01/22 1253  BP: (!) 143/85  Pulse: 93  Temp: 98.7 F (37.1 C)  SpO2: 96%   Filed Weights   12/01/22 1253  Weight: 188 lb 12.8 oz (85.6 kg)    Physical Exam Vitals and nursing note reviewed.  Constitutional:      Comments: Alone; ambulating independently  HENT:     Head: Normocephalic and atraumatic.     Mouth/Throat:     Mouth: Mucous membranes are moist.     Pharynx: Oropharynx is clear. No oropharyngeal exudate.  Eyes:     Extraocular Movements: Extraocular movements intact.     Pupils: Pupils are equal, round, and reactive to light.  Cardiovascular:  Rate and Rhythm: Normal rate and regular rhythm.  Pulmonary:     Effort: No respiratory distress.     Breath sounds: No wheezing.  Abdominal:     General: Bowel sounds are normal. There is no  distension.     Palpations: Abdomen is soft. There is no mass.     Tenderness: There is no abdominal tenderness. There is no guarding or rebound.  Musculoskeletal:        General: No tenderness. Normal range of motion.     Cervical back: Normal range of motion and neck supple.  Skin:    General: Skin is warm.  Neurological:     General: No focal deficit present.     Mental Status: She is alert and oriented to person, place, and time.  Psychiatric:        Mood and Affect: Affect normal.        Behavior: Behavior normal.        Judgment: Judgment normal.     Latest Reference Range & Units Most Recent 05/09/19 09:50 08/12/19 08:18 12/04/19 13:10 04/08/20 13:51 08/05/20 13:29 01/04/21 10:05 04/22/21 09:58 08/19/21 10:45  CA 27.29 0.0 - 38.6 U/mL 22.9 08/19/21 10:45 21.9 19.9 18.3 16.8 17.7 22.9 19.7 22.9    LABORATORY DATA:  I have reviewed the data as listed Lab Results  Component Value Date   WBC 6.3 11/28/2022   HGB 11.4 (L) 11/28/2022   HCT 34.2 (L) 11/28/2022   MCV 85.9 11/28/2022   PLT 211 11/28/2022   Recent Labs    05/29/22 1054 11/28/22 1446  NA 137 139  K 3.9 4.0  CL 100 104  CO2 27 26  GLUCOSE 91 96  BUN 14 12  CREATININE 0.68 0.89  CALCIUM 9.0 8.9  GFRNONAA >60 >60  PROT 7.6 6.9  ALBUMIN 4.2 3.9  AST 24 22  ALT 20 18  ALKPHOS 27* 26*  BILITOT 0.9 0.4    RADIOGRAPHIC STUDIES: I have personally reviewed the radiological images as listed and agreed with the findings in the report. No results found.  ASSESSMENT & PLAN:   Carcinoma of upper-outer quadrant of left breast in female, estrogen receptor positive ([facility]) # 2019 Stage IB multifocal LEFT breast cancer ER-positive; Her2 positive- on Tamoxifen [started in  Fall 2020].  S/p left mastectomy; right prophylactic mastectomy. July 2022 CT scan chest and pelvis negative for any metastatic disease; bilateral mastectomies and mild scarring noted in the left upper lobe/radiation-induced likely.   Stable.     #Patient continues to be on adjuvant tamoxifen.  No concerns for any recurrence. Continue Zometa adjuvantly every 6 months x 3 years. [#1 on Nov 14th, 2022].  No jaw pain.  Calcium normal.  #Tumor marker: Within normal limits; however up-and-down.  Informed the patient that this is unlikely to be concerning.  Would recommend holding off on imaging unless significantly elevated; or an abnormal trend noted.    # Hot flashes-G-1-2;  [Effexor -dizziness]; currently on Lexapro 20 mg a day- stable.   #  Premenopausal [July 2022]-pelvic ultrasound[IUD-cystic changes in the endometrium] in place; yearly follow-up with gynecology- stable.   # Mild Anemia- ? Iron def from menstrual cycles- #Recommend gentle iron [iron biglycinate; 28 mg ] 1 pill a day.  This pill is unlikely to cause stomach upset or cause constipation.    # CHEK- 2 mutations: s/p  April 11th, 2023- colonoscopy- NEG [Dr.Byrnett]-reviewed normal.   # Screening for osteoporosis-JUNE 2023- The BMD T-score of 0.0.  Normal  continue calcium plus vitamin D [? 600 u].  Vit D MAY 2024- 48.   # Bil hand T/numbess: carpal tunnel- discussed re: Splint vs orthopedic surgery. Defer to PCP.   [#1 on Nov 14th, 2022- nov 2025] my chart # DISPOSITION: # zometa today # follow up in 6 months- MD; 3 days prior-labs-cbc/cmp/ca-27-29;Vit D 25OH levels; iron studies; ferritin; Zometa - Dr.B   All questions were answered. The patient knows to call the clinic with any problems, questions or concerns.     Earna Coder, MD 12/01/2022 1:42 PM

## 2022-12-01 NOTE — Assessment & Plan Note (Addendum)
#   2019 Stage IB multifocal LEFT breast cancer ER-positive; Her2 positive- on Tamoxifen [started in  Fall 2020].  S/p left mastectomy; right prophylactic mastectomy. July 2022 CT scan chest and pelvis negative for any metastatic disease; bilateral mastectomies and mild scarring noted in the left upper lobe/radiation-induced likely.   Stable.    #Patient continues to be on adjuvant tamoxifen.  No concerns for any recurrence. Continue Zometa adjuvantly every 6 months x 3 years. [#1 on Nov 14th, 2022].  No jaw pain.  Calcium normal.  #Tumor marker: Within normal limits; however up-and-down.  Informed the patient that this is unlikely to be concerning.  Would recommend holding off on imaging unless significantly elevated; or an abnormal trend noted.    # Hot flashes-G-1-2;  [Effexor -dizziness]; currently on Lexapro 20 mg a day- stable.   #  Premenopausal [July 2022]-pelvic ultrasound[IUD-cystic changes in the endometrium] in place; yearly follow-up with gynecology- stable.   # Mild Anemia- ? Iron def from menstrual cycles- #Recommend gentle iron [iron biglycinate; 28 mg ] 1 pill a day.  This pill is unlikely to cause stomach upset or cause constipation.    # CHEK- 2 mutations: s/p  April 11th, 2023- colonoscopy- NEG [Dr.Byrnett]-reviewed normal.   # Screening for osteoporosis-JUNE 2023- The BMD T-score of 0.0.  Normal continue calcium plus vitamin D [? 600 u].  Vit D MAY 2024- 48.   # Bil hand T/numbess: carpal tunnel- discussed re: Splint vs orthopedic surgery. Defer to PCP.   [#1 on Nov 14th, 2022- nov 2025] my chart # DISPOSITION: # zometa today # follow up in 6 months- MD; 3 days prior-labs-cbc/cmp/ca-27-29;Vit D 25OH levels; iron studies; ferritin; Zometa - Dr.B

## 2022-12-17 ENCOUNTER — Other Ambulatory Visit: Payer: Self-pay | Admitting: Internal Medicine

## 2022-12-17 DIAGNOSIS — C50419 Malignant neoplasm of upper-outer quadrant of unspecified female breast: Secondary | ICD-10-CM

## 2022-12-18 ENCOUNTER — Encounter: Payer: Self-pay | Admitting: Internal Medicine

## 2023-01-04 ENCOUNTER — Encounter: Payer: Self-pay | Admitting: Internal Medicine

## 2023-01-18 ENCOUNTER — Encounter: Payer: Self-pay | Admitting: Internal Medicine

## 2023-01-19 ENCOUNTER — Encounter: Payer: Self-pay | Admitting: Internal Medicine

## 2023-01-19 ENCOUNTER — Telehealth: Payer: Self-pay | Admitting: *Deleted

## 2023-01-19 MED ORDER — ESCITALOPRAM OXALATE 20 MG PO TABS: 20.0000 mg | ORAL_TABLET | Freq: Every day | ORAL | 2 refills | Status: DC

## 2023-01-19 NOTE — Telephone Encounter (Signed)
Refill request

## 2023-03-19 ENCOUNTER — Other Ambulatory Visit: Payer: Self-pay | Admitting: Internal Medicine

## 2023-03-19 DIAGNOSIS — C50419 Malignant neoplasm of upper-outer quadrant of unspecified female breast: Secondary | ICD-10-CM

## 2023-06-06 ENCOUNTER — Inpatient Hospital Stay: Payer: Self-pay

## 2023-06-07 ENCOUNTER — Inpatient Hospital Stay: Payer: BC Managed Care – PPO | Attending: Internal Medicine

## 2023-06-07 DIAGNOSIS — Z9013 Acquired absence of bilateral breasts and nipples: Secondary | ICD-10-CM | POA: Insufficient documentation

## 2023-06-07 DIAGNOSIS — C50412 Malignant neoplasm of upper-outer quadrant of left female breast: Secondary | ICD-10-CM | POA: Diagnosis present

## 2023-06-07 DIAGNOSIS — R0683 Snoring: Secondary | ICD-10-CM | POA: Diagnosis not present

## 2023-06-07 DIAGNOSIS — Z1501 Genetic susceptibility to malignant neoplasm of breast: Secondary | ICD-10-CM | POA: Diagnosis not present

## 2023-06-07 DIAGNOSIS — R232 Flushing: Secondary | ICD-10-CM | POA: Insufficient documentation

## 2023-06-07 DIAGNOSIS — D500 Iron deficiency anemia secondary to blood loss (chronic): Secondary | ICD-10-CM | POA: Diagnosis not present

## 2023-06-07 DIAGNOSIS — Z803 Family history of malignant neoplasm of breast: Secondary | ICD-10-CM | POA: Insufficient documentation

## 2023-06-07 DIAGNOSIS — Z800 Family history of malignant neoplasm of digestive organs: Secondary | ICD-10-CM | POA: Insufficient documentation

## 2023-06-07 DIAGNOSIS — Z923 Personal history of irradiation: Secondary | ICD-10-CM | POA: Diagnosis not present

## 2023-06-07 DIAGNOSIS — Z1509 Genetic susceptibility to other malignant neoplasm: Secondary | ICD-10-CM | POA: Diagnosis not present

## 2023-06-07 DIAGNOSIS — Z170 Estrogen receptor positive status [ER+]: Secondary | ICD-10-CM | POA: Diagnosis not present

## 2023-06-07 DIAGNOSIS — G5600 Carpal tunnel syndrome, unspecified upper limb: Secondary | ICD-10-CM | POA: Insufficient documentation

## 2023-06-07 DIAGNOSIS — Z1502 Genetic susceptibility to malignant neoplasm of ovary: Secondary | ICD-10-CM | POA: Insufficient documentation

## 2023-06-07 DIAGNOSIS — Z79810 Long term (current) use of selective estrogen receptor modulators (SERMs): Secondary | ICD-10-CM | POA: Insufficient documentation

## 2023-06-07 DIAGNOSIS — Z9221 Personal history of antineoplastic chemotherapy: Secondary | ICD-10-CM | POA: Diagnosis not present

## 2023-06-07 DIAGNOSIS — Z8041 Family history of malignant neoplasm of ovary: Secondary | ICD-10-CM | POA: Insufficient documentation

## 2023-06-07 DIAGNOSIS — N920 Excessive and frequent menstruation with regular cycle: Secondary | ICD-10-CM | POA: Insufficient documentation

## 2023-06-07 DIAGNOSIS — Z79899 Other long term (current) drug therapy: Secondary | ICD-10-CM | POA: Insufficient documentation

## 2023-06-07 LAB — CMP (CANCER CENTER ONLY)
ALT: 28 U/L (ref 0–44)
AST: 27 U/L (ref 15–41)
Albumin: 4 g/dL (ref 3.5–5.0)
Alkaline Phosphatase: 32 U/L — ABNORMAL LOW (ref 38–126)
Anion gap: 10 (ref 5–15)
BUN: 12 mg/dL (ref 6–20)
CO2: 27 mmol/L (ref 22–32)
Calcium: 9.1 mg/dL (ref 8.9–10.3)
Chloride: 101 mmol/L (ref 98–111)
Creatinine: 0.71 mg/dL (ref 0.44–1.00)
GFR, Estimated: >60 mL/min (ref >60)
Glucose, Bld: 98 mg/dL (ref 70–99)
Potassium: 3.6 mmol/L (ref 3.5–5.1)
Sodium: 138 mmol/L (ref 135–145)
Total Bilirubin: 0.6 mg/dL (ref <1.2)
Total Protein: 6.8 g/dL (ref 6.5–8.1)

## 2023-06-07 LAB — CBC WITH DIFFERENTIAL (CANCER CENTER ONLY)
Abs Immature Granulocytes: 0.02 10*3/uL (ref 0.00–0.07)
Basophils Absolute: 0 10*3/uL (ref 0.0–0.1)
Basophils Relative: 1 %
Eosinophils Absolute: 0.1 10*3/uL (ref 0.0–0.5)
Eosinophils Relative: 2 %
HCT: 38.2 % (ref 36.0–46.0)
Hemoglobin: 12.5 g/dL (ref 12.0–15.0)
Immature Granulocytes: 0 %
Lymphocytes Relative: 32 %
Lymphs Abs: 2.2 10*3/uL (ref 0.7–4.0)
MCH: 28.5 pg (ref 26.0–34.0)
MCHC: 32.7 g/dL (ref 30.0–36.0)
MCV: 87.2 fL (ref 80.0–100.0)
Monocytes Absolute: 0.6 10*3/uL (ref 0.1–1.0)
Monocytes Relative: 9 %
Neutro Abs: 3.8 10*3/uL (ref 1.7–7.7)
Neutrophils Relative %: 56 %
Platelet Count: 243 10*3/uL (ref 150–400)
RBC: 4.38 MIL/uL (ref 3.87–5.11)
RDW: 13.6 % (ref 11.5–15.5)
WBC Count: 6.7 10*3/uL (ref 4.0–10.5)
nRBC: 0 % (ref 0.0–0.2)

## 2023-06-07 LAB — IRON AND TIBC
Iron: 61 ug/dL (ref 28–170)
Saturation Ratios: 13 % (ref 10.4–31.8)
TIBC: 461 ug/dL — ABNORMAL HIGH (ref 250–450)
UIBC: 400 ug/dL

## 2023-06-07 LAB — VITAMIN D 25 HYDROXY (VIT D DEFICIENCY, FRACTURES): Vit D, 25-Hydroxy: 71.17 ng/mL (ref 30–100)

## 2023-06-07 LAB — FERRITIN: Ferritin: 25 ng/mL (ref 11–307)

## 2023-06-08 ENCOUNTER — Ambulatory Visit: Payer: BC Managed Care – PPO | Admitting: Internal Medicine

## 2023-06-08 ENCOUNTER — Ambulatory Visit: Payer: BC Managed Care – PPO

## 2023-06-08 LAB — CANCER ANTIGEN 27.29: CA 27.29: 19.3 U/mL (ref 0.0–38.6)

## 2023-06-15 ENCOUNTER — Ambulatory Visit: Payer: BC Managed Care – PPO | Admitting: Internal Medicine

## 2023-06-15 ENCOUNTER — Inpatient Hospital Stay: Payer: BC Managed Care – PPO

## 2023-06-15 ENCOUNTER — Other Ambulatory Visit: Payer: Self-pay | Admitting: Internal Medicine

## 2023-06-15 ENCOUNTER — Encounter: Payer: Self-pay | Admitting: Nurse Practitioner

## 2023-06-15 ENCOUNTER — Ambulatory Visit: Payer: BC Managed Care – PPO

## 2023-06-15 ENCOUNTER — Inpatient Hospital Stay (HOSPITAL_BASED_OUTPATIENT_CLINIC_OR_DEPARTMENT_OTHER): Payer: BC Managed Care – PPO | Admitting: Nurse Practitioner

## 2023-06-15 VITALS — BP 134/88 | HR 88 | Temp 99.1°F | Wt 185.0 lb

## 2023-06-15 DIAGNOSIS — Z08 Encounter for follow-up examination after completed treatment for malignant neoplasm: Secondary | ICD-10-CM

## 2023-06-15 DIAGNOSIS — C50419 Malignant neoplasm of upper-outer quadrant of unspecified female breast: Secondary | ICD-10-CM | POA: Diagnosis not present

## 2023-06-15 DIAGNOSIS — Z79810 Long term (current) use of selective estrogen receptor modulators (SERMs): Secondary | ICD-10-CM

## 2023-06-15 DIAGNOSIS — Z5181 Encounter for therapeutic drug level monitoring: Secondary | ICD-10-CM | POA: Diagnosis not present

## 2023-06-15 DIAGNOSIS — C50412 Malignant neoplasm of upper-outer quadrant of left female breast: Secondary | ICD-10-CM | POA: Diagnosis not present

## 2023-06-15 DIAGNOSIS — Z7983 Long term (current) use of bisphosphonates: Secondary | ICD-10-CM

## 2023-06-15 DIAGNOSIS — Z853 Personal history of malignant neoplasm of breast: Secondary | ICD-10-CM

## 2023-06-15 DIAGNOSIS — R0683 Snoring: Secondary | ICD-10-CM

## 2023-06-15 DIAGNOSIS — Z1589 Genetic susceptibility to other disease: Secondary | ICD-10-CM | POA: Diagnosis not present

## 2023-06-15 MED ORDER — TAMOXIFEN CITRATE 20 MG PO TABS
20.0000 mg | ORAL_TABLET | Freq: Every day | ORAL | 0 refills | Status: DC
Start: 2023-06-15 — End: 2023-06-15

## 2023-06-15 MED ORDER — ZOLEDRONIC ACID 4 MG/100ML IV SOLN
4.0000 mg | Freq: Once | INTRAVENOUS | Status: AC
Start: 2023-06-15 — End: 2023-06-15
  Administered 2023-06-15: 4 mg via INTRAVENOUS
  Filled 2023-06-15: qty 100

## 2023-06-15 MED ORDER — SODIUM CHLORIDE 0.9 % IV SOLN
Freq: Once | INTRAVENOUS | Status: AC
  Filled 2023-06-15: qty 250

## 2023-06-15 MED ORDER — TAMOXIFEN CITRATE 20 MG PO TABS: 20.0000 mg | ORAL_TABLET | Freq: Every day | ORAL | 3 refills | Status: DC

## 2023-06-15 NOTE — Telephone Encounter (Signed)
V/o to approve RF for tamoxifen

## 2023-06-15 NOTE — Progress Notes (Signed)
[facility] CONSULT NOTE  Patient Care Team: Harlon Flor, Jonnie Finner, PA-C as PCP - General (Family Medicine) Lemar Livings, Merrily Pew, MD (General Surgery) Nadara Mustard, MD as Referring Physician (Obstetrics and Gynecology) [name], MD (Inactive) as Referring Physician (Hematology and Oncology) Jim Like, RN as Oncology Nurse Navigator Carmina Miller, MD as Referring Physician (Radiation Oncology) Jerl Mina, MD as Consulting Physician (Family Medicine) Earna Coder, MD as Consulting Physician (Oncology)  CHIEF COMPLAINTS/PURPOSE OF CONSULTATION: Breast cancer  #  Oncology History Overview Note  Denise [name] is a 47 y.o. 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  Pathology revealed no residual carcinoma in the breast.  One of three sentinel lymph nodes were positive.  One lymph node had 2 metastatic foci (2.1 mm and 1 mm).  Pathologic stage revealed ypT0 ypN1a.   Index breast mass and axillary node biopsy on 12/06/2017 revealed grade II invasive ductal carcinoma with calcifications at the 11 o'clock position.  There was metastatic carcinoma in 1 of 1 lymph nodes.  Tumor was ER + (100%), PR + (100%), and Ki67 5%.  Her2/neu was 3+ by IHC (heterogeneous) and FISH +.   Diagnostic left mammogram and ultrasound on 12/06/2017 revealed a 1.7 x 1.3 x 1.3 cm irregular hypoechoic mass with internal calcifications at the 11 o'clock position 2 cm from the nipple with internal calcifications. There was a similar appearing 0.9 x 0.8 x 0.7 cm mass at the 11 o'clock position 4 cm from the nipple (2.4 cm from the index lesion).  There were suspicious, segmental calcifications originating from the index lesion and extending 5 cm posteriorly.  There was a 0.6 cm morphologically abnormal left axillary lymph node.   Bilateral breast MRI on  12/10/2017 revealed a papilloma in the right breast  The recently biopsied malignancy in the left breast (15 x 16 x 16 mm) was identified. The adjacent and more superiorly located 11 o'clock mass (7 x 12 mm) seen on recent ultrasound, 4 cm from the nipple on ultrasound, was also identified. There were 2 probable satellite lesions (7 mm, medial lesion; posterior and lateral lesion). The total span of malignancy was 3 x 3.1 x 4.5 cm.  There were calcifications extending up to 5 cm posterior to the biopsied malignancy which were highly suspicious on mammography but not appreciated.  There was a mass in the lower outer left breast measured 5 mm, representing a change.  The known metastatic node in the left axilla was identified. A node along the posterolateral margin of the pectoralis minor (cortex 5 mm) was nonspecific but at least somewhat suspicious.   Chest, abdomen, and pelvic CT on 12/19/2017 revealed a solitary enlarged biopsy-proven metastatic left axillary lymph node.  There were no additional findings suspicious for metastatic disease in the chest, abdomen or pelvis.  There was a nonspecific tiny sclerotic upper right sacral lesion, more likely a benign bone island. Bone scintigraphy correlation versus follow-up CT could be considered.   Bone scan on 01/11/2018 was negative for metastatic pattern. No areas of focal tracer uptake. Area of concern in sacrum on CT likely represents benign bone island.    [facility] genetic testing on 08/26/2013 was negative for BRCA1 and 2. 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  She has a family history significant for breast, ovarian, pancreatic, and prostate cancer.   She received 6 cycles  of TCHP chemotherapy (12/21/2017 - 04/17/2018) with Criss Alvine support. Cycle #3 was held due to elevated LFTs on 02/04/2018. She received Herceptin + Perjeta alone (04/30/2018 - 05/30/2018).  She is s/p cycle #1 Kadcyla (06/20/2018).   She began breast  radiation on 06/25/2018    Echocardiograms:  12/20/2017 - EF of 60-65%, 03/27/2018 - EF of 60-65%, and 06/18/2018 - EF of 55-60%.   She has a history of elevated LFTs.  Hepatitis B and C serologies were negative on 02/04/2018.  RUQ ultrasound on 02/06/2018 revealed a small anterior gallbladder wall polyp. There was no evidence of cholelithiasis or cholecystis. CBD measured normal at 2 mm, with no evidence of choledocholithiasis. There was normal direction of blood flow towards the liver noted.   Carcinoma of upper-outer quadrant of left breast in female, estrogen receptor positive (HCC)  09/17/2014 Initial Diagnosis   Malignant neoplasm of upper-outer quadrant of female breast (HCC)   12/21/2017 - 04/18/2018 Chemotherapy   The patient had dexamethasone (DECADRON) 4 MG tablet, 8 mg, Oral, 2 times daily, 2 of 2 cycles palonosetron (ALOXI) injection 0.25 mg, 0.25 mg, Intravenous,  Once, 6 of 6 cycles Administration: 0.25 mg (12/21/2017), 0.25 mg (01/14/2018), 0.25 mg (03/28/2018), 0.25 mg (04/17/2018), 0.25 mg (02/11/2018), 0.25 mg (03/07/2018) pegfilgrastim (NEULASTA ONPRO KIT) injection 6 mg, 6 mg, Subcutaneous, Once, 1 of 1 cycle Administration: 6 mg (12/21/2017) pegfilgrastim-cbqv (UDENYCA) injection 6 mg, 6 mg, Subcutaneous, Once, 6 of 6 cycles Administration: 6 mg (01/15/2018), 6 mg (03/29/2018), 6 mg (04/18/2018), 6 mg (02/12/2018), 6 mg (03/08/2018) trastuzumab (HERCEPTIN) 600 mg in sodium chloride 0.9 % 250 mL chemo infusion, 672 mg, Intravenous,  Once, 6 of 6 cycles Administration: 600 mg (12/21/2017), 450 mg (01/14/2018), 500 mg (03/28/2018), 500 mg (04/17/2018), 500 mg (02/11/2018), 500 mg (03/07/2018) CARBOplatin (PARAPLATIN) 870 mg in sodium chloride 0.9 % 500 mL chemo infusion, 890 mg (100 % of original dose 885 mg), Intravenous,  Once, 6 of 6 cycles Dose modification:   (original dose 885 mg, Cycle 1),   (original dose 885 mg, Cycle 2),   (original dose 885 mg, Cycle 5),   (original dose 885 mg,  Cycle 6),   (original dose 885 mg, Cycle 3),   (original dose 885 mg, Cycle 4) Administration: 870 mg (12/21/2017), 870 mg (01/14/2018), 870 mg (03/28/2018), 870 mg (04/17/2018), 870 mg (02/11/2018), 870 mg (03/07/2018) DOCEtaxel (TAXOTERE) 150 mg in sodium chloride 0.9 % 250 mL chemo infusion, 75 mg/m2 = 150 mg, Intravenous,  Once, 6 of 6 cycles Dose modification: 55 mg/m2 (original dose 75 mg/m2, Cycle 5, Reason: Change in LFTs, Comment: per pharmacy) Administration: 150 mg (12/21/2017), 150 mg (01/14/2018), 110 mg (03/28/2018), 150 mg (04/17/2018), 150 mg (02/11/2018), 150 mg (03/07/2018) pertuzumab (PERJETA) 840 mg in sodium chloride 0.9 % 250 mL chemo infusion, 840 mg, Intravenous, Once, 6 of 6 cycles Administration: 840 mg (12/21/2017), 420 mg (01/14/2018), 420 mg (03/28/2018), 420 mg (04/17/2018), 420 mg (02/11/2018), 420 mg (03/07/2018) fosaprepitant (EMEND) 150 mg, dexamethasone (DECADRON) 12 mg in sodium chloride 0.9 % 145 mL IVPB, , Intravenous,  Once, 6 of 6 cycles Administration:  (12/21/2017),  (01/14/2018),  (03/28/2018),  (04/17/2018),  (02/11/2018),  (03/07/2018)  for chemotherapy treatment.    05/09/2018 - 05/30/2018 Chemotherapy   The patient had trastuzumab (HERCEPTIN) 500 mg in sodium chloride 0.9 % 250 mL chemo infusion, 504 mg, Intravenous,  Once, 2 of 11 cycles Administration: 500 mg (05/30/2018), 500 mg (05/09/2018) pertuzumab (PERJETA) 420 mg in sodium chloride 0.9 % 250 mL chemo infusion,  420 mg, Intravenous, Once, 2 of 11 cycles Administration: 420 mg (05/30/2018), 420 mg (05/09/2018)  for chemotherapy treatment.    01/17/2019 - 02/14/2019 Chemotherapy   Patient is on Treatment Plan : BREAST (Kanjinti) Trastuzumab q21d       HISTORY OF PRESENTING ILLNESS: Ambulating independently.  Alone.  Denise [name] 47 y.o.  female with a history of stage Ib ER/PR positive HER2/neu positive breast cancer currently on tamoxifen- on zometa q 6 M adjuvant is here for follow-up/review results of  tumor marker.  Patient doing well. Intermittent  hot flashes. She continues to have Escitalopram. Continues to be compliance with Tamoxifen.   Patient denies any new onset of bone pain or joint pains.   Not any worse.  No shortness of breath or cough.  Denies any jaw pain.  Review of Systems  Constitutional:  Negative for chills, diaphoresis, fever, malaise/fatigue and weight loss.  HENT:  Negative for nosebleeds and sore throat.   Eyes:  Negative for double vision.  Respiratory:  Negative for cough, hemoptysis, sputum production, shortness of breath and wheezing.   Cardiovascular:  Negative for chest pain, palpitations, orthopnea and leg swelling.  Gastrointestinal:  Negative for abdominal pain, blood in stool, constipation, diarrhea, heartburn, melena, nausea and vomiting.  Genitourinary:  Negative for dysuria, frequency and urgency.  Musculoskeletal:  Negative for back pain and joint pain.  Skin: Negative.  Negative for itching and rash.  Neurological:  Negative for dizziness, tingling, focal weakness, weakness and headaches.  Endo/Heme/Allergies:  Does not bruise/bleed easily.  Psychiatric/Behavioral:  Negative for depression. The patient is not nervous/anxious and does not have insomnia.      MEDICAL HISTORY:  Past Medical History:  Diagnosis Date   Abnormal breast biopsy    PASH, Dr. Lemar Livings   Breast cancer [facility]) 11/2017   left breast; ER/PR/Her2neu POS   Family history of breast cancer    Family history of ovarian cancer    Fibroadenoma of breast, left 2000, 2016   Genetic screening 08/26/2013   BRCA/BART negative/Myriad, CHEK2 POS 2017   Hypertension    Increased risk of breast cancer 2017   IBIS=47%   Left breast lump 06/18/2017   Fluid/drained [name]   Migraine    Monoallelic mutation of CHEK2 gene in female patient 2017   increased risk of breast and colon cancer   Personal history of chemotherapy    Personal history of radiation therapy     SURGICAL  HISTORY: Past Surgical History:  Procedure Laterality Date   BREAST BIOPSY Bilateral 09/08/14   neg   BREAST BIOPSY Left 03/16/2015   Procedure: BREAST BIOPSY WITH NEEDLE LOCALIZATION;  Surgeon: Earline Mayotte, MD;  Location: [facility] ORS;  Service: General;  Laterality: Left;   BREAST BIOPSY Left 11/2017   positive   BREAST CYST ASPIRATION Left 06/18/2017   cyst aspiration   BREAST EXCISIONAL BIOPSY Left 09/2014   BREAST EXCISIONAL BIOPSY Right    age 47's   BREAST RECONSTRUCTION WITH PLACEMENT OF TISSUE EXPANDER AND FLEX HD (ACELLULAR HYDRATED DERMIS) Left 05/20/2018   Procedure: BREAST RECONSTRUCTION WITH PLACEMENT OF TISSUE EXPANDER AND FLEX HD (ACELLULAR HYDRATED DERMIS);  Surgeon: Peggye Form, DO;  Location: [facility] ORS;  Service: Plastics;  Laterality: Left;   BREAST RECONSTRUCTION WITH PLACEMENT OF TISSUE EXPANDER AND FLEX HD (ACELLULAR HYDRATED DERMIS) Right 12/09/2018   Procedure: BREAST RECONSTRUCTION WITH PLACEMENT OF TISSUE EXPANDER AND FLEX HD (ACELLULAR HYDRATED DERMIS);  Surgeon: Peggye Form, DO;  Location: [facility] ORS;  Service: Government social research officer;  Laterality: Right;   BREAST SURGERY Right March 2000   benign fibroadenoma   BREAST SURGERY Left 08/08/13   excision   BREAST SURGERY Left 09/23/14   excision   COLONOSCOPY WITH PROPOFOL N/A 09/28/2021   Procedure: COLONOSCOPY WITH PROPOFOL;  Surgeon: Earline Mayotte, MD;  Location: [facility] ENDOSCOPY;  Service: Endoscopy;  Laterality: N/A;   ESOPHAGOGASTRODUODENOSCOPY (EGD) WITH PROPOFOL N/A 01/20/2019   Procedure: ESOPHAGOGASTRODUODENOSCOPY (EGD) WITH PROPOFOL;  Surgeon: Toney Reil, MD;  Location: [facility] ENDOSCOPY;  Service: Gastroenterology;  Laterality: N/A;   MASTECTOMY Left 05/20/2018   MASTECTOMY W/ SENTINEL NODE BIOPSY Left 05/20/2018   Procedure: MASTECTOMY WITH SENTINEL LYMPH NODE BIOPSY;  Surgeon: Earline Mayotte, MD;  Location: [facility] ORS;  Service: General;  Laterality: Left;   PORT-A-CATH REMOVAL Right 03/31/2019    Procedure: REMOVAL PORT-A-CATH;  Surgeon: Peggye Form, DO;  Location: [facility] OR[facility];  Service: Plastics;  Laterality: Right;   PORTACATH PLACEMENT Right 12/17/2017   Procedure: INSERTION PORT-A-CATH;  Surgeon: Earline Mayotte, MD;  Location: [facility] ORS;  Service: General;  Laterality: Right;   REMOVAL OF BILATERAL TISSUE EXPANDERS WITH PLACEMENT OF BILATERAL BREAST IMPLANTS Bilateral 03/31/2019   Procedure: REMOVAL OF BILATERAL TISSUE EXPANDERS WITH PLACEMENT OF BILATERAL BREAST IMPLANTS;  Surgeon: Peggye Form, DO;  Location: [facility] ORS;  Service: Plastics;  Laterality: Bilateral;  2 hours   SIMPLE MASTECTOMY WITH AXILLARY SENTINEL NODE BIOPSY Right 12/09/2018   Procedure: SIMPLE MASTECTOMY RIGHT;  Surgeon: Earline Mayotte, MD;  Location: [facility] ORS;  Service: General;  Laterality: Right;    SOCIAL HISTORY: Social History   Socioeconomic History   Marital status: Married    Spouse name: Not on file   Number of children: Not on file   Years of education: Not on file   Highest education level: Not on file  Occupational History   Not on file  Tobacco Use   Smoking status: Never   Smokeless tobacco: Never  Vaping Use   Vaping status: Never Used  Substance and Sexual Activity   Alcohol use: Yes    Comment: occasionally   Drug use: No   Sexual activity: Yes    Birth control/protection: I.U.D.    Comment: Paragard  Other Topics Concern   Not on file  Social History Narrative   Not on file   Social Drivers of Health   Financial Resource Strain: Low Risk  (01/08/2019)   Overall Financial Resource Strain (CARDIA)    Difficulty of Paying Living Expenses: Not very hard  Food Insecurity: No Food Insecurity (01/08/2019)   Hunger Vital Sign    Worried About Running Out of Food in the Last Year: Never true    Ran Out of Food in the Last Year: Never true  Transportation Needs: Unknown (01/08/2019)   PRAPARE - Administrator, Civil Service (Medical): No    Lack of  Transportation (Non-Medical): Not on file  Physical Activity: Sufficiently Active (08/06/2017)   Exercise Vital Sign    Days of Exercise per Week: 3 days    Minutes of Exercise per Session: 60 min  Stress: No Stress Concern Present (08/06/2017)   [employer] of Occupational Health - Occupational Stress Questionnaire    Feeling of Stress : Only a little  Social Connections: Moderately Integrated (08/06/2017)   Social Connection and Isolation Panel [NHANES]    Frequency of Communication with Friends and Family: Twice a week    Frequency of Social Gatherings with Friends and Family:  Once a week    Attends Religious Services: More than 4 times per year    Active Member of Clubs or Organizations: No    Attends Banker Meetings: Never    Marital Status: Married  Catering manager Violence: Not At Risk (08/06/2017)   Humiliation, Afraid, Rape, and Kick questionnaire    Fear of Current or Ex-Partner: No    Emotionally Abused: No    Physically Abused: No    Sexually Abused: No    FAMILY HISTORY: Family History  Problem Relation Age of Onset   Skin cancer Mother    Prostate cancer Father 79       requiring tx/hormones   Depression Sister    Breast cancer Paternal Grandmother 56   Breast cancer Maternal Aunt 60   Ovarian cancer Maternal Aunt 70   Pancreatic cancer Maternal Aunt 75   Breast cancer Paternal Aunt 25   Stroke Maternal Grandmother    Stroke Maternal Grandfather    Alcohol abuse Paternal Grandfather    Prostate cancer Maternal Uncle    Alcohol abuse Paternal Uncle    Anxiety disorder Paternal Uncle     ALLERGIES:  is allergic to steri-strip compound benzoin [benzoin], meloxicam, silicone, and tape.  MEDICATIONS:  Current Outpatient Medications  Medication Sig Dispense Refill   acetaminophen (TYLENOL) 500 MG tablet Take 1,000 mg by mouth every 6 (six) hours as needed for moderate pain or headache.      Calcium Carbonate (CALCIUM 600 PO) Take 1 tablet by  mouth daily.     cholecalciferol (VITAMIN D3) 25 MCG (1000 UNIT) tablet Take 3,000 Units by mouth daily.     escitalopram (LEXAPRO) 20 MG tablet Take 1 tablet (20 mg total) by mouth daily. 90 tablet 2   tamoxifen (NOLVADEX) 20 MG tablet Take 1 tablet by mouth once daily 90 tablet 0   vitamin C (ASCORBIC ACID) 500 MG tablet Take 500 mg by mouth daily.     loratadine (CLARITIN) 10 MG tablet Take 10 mg by mouth daily. (Patient not taking: Reported on 06/15/2023)     Current Facility-Administered Medications  Medication Dose Route Frequency Provider Last Rate Last Admin   paragard intrauterine copper IUD   Intrauterine Once Copland, Alicia B, PA-C       Facility-Administered Medications Ordered in Other Visits  Medication Dose Route Frequency Provider Last Rate Last Admin   sodium chloride flush (NS) 0.9 % injection 10 mL  10 mL Intravenous PRN [name], MD   10 mL at 04/17/18 0844      .  PHYSICAL EXAMINATION: ECOG PERFORMANCE STATUS: 0 - Asymptomatic  Vitals:   06/15/23 1417  BP: 134/88  Pulse: 88  Temp: 99.1 F (37.3 C)  SpO2: 98%   Filed Weights   06/15/23 1417  Weight: 185 lb (83.9 kg)    Physical Exam Vitals and nursing note reviewed.  Constitutional:      Comments: Alone; ambulating independently  HENT:     Head: Normocephalic and atraumatic.     Mouth/Throat:     Mouth: Mucous membranes are moist.     Pharynx: Oropharynx is clear. No oropharyngeal exudate.  Eyes:     Extraocular Movements: Extraocular movements intact.     Pupils: Pupils are equal, round, and reactive to light.  Cardiovascular:     Rate and Rhythm: Normal rate and regular rhythm.  Pulmonary:     Effort: No respiratory distress.     Breath sounds: No wheezing.  Abdominal:  General: Bowel sounds are normal. There is no distension.     Palpations: Abdomen is soft. There is no mass.     Tenderness: There is no abdominal tenderness. There is no guarding or rebound.   Musculoskeletal:        General: No tenderness. Normal range of motion.     Cervical back: Normal range of motion and neck supple.  Skin:    General: Skin is warm.  Neurological:     General: No focal deficit present.     Mental Status: She is alert and oriented to person, place, and time.  Psychiatric:        Mood and Affect: Affect normal.        Behavior: Behavior normal.        Judgment: Judgment normal.    Latest Reference Range & Units Most Recent 05/09/19 09:50 08/12/19 08:18 12/04/19 13:10 04/08/20 13:51 08/05/20 13:29 01/04/21 10:05 04/22/21 09:58 08/19/21 10:45  CA 27.29 0.0 - 38.6 U/mL 22.9 08/19/21 10:45 21.9 19.9 18.3 16.8 17.7 22.9 19.7 22.9    LABORATORY DATA:  I have reviewed the data as listed Lab Results  Component Value Date   WBC 6.7 06/07/2023   HGB 12.5 06/07/2023   HCT 38.2 06/07/2023   MCV 87.2 06/07/2023   PLT 243 06/07/2023   Recent Labs    11/28/22 1446 06/07/23 1557  NA 139 138  K 4.0 3.6  CL 104 101  CO2 26 27  GLUCOSE 96 98  BUN 12 12  CREATININE 0.89 0.71  CALCIUM 8.9 9.1  GFRNONAA >60 >60  PROT 6.9 6.8  ALBUMIN 3.9 4.0  AST 22 27  ALT 18 28  ALKPHOS 26* 32*  BILITOT 0.4 0.6    RADIOGRAPHIC STUDIES: I have personally reviewed the radiological images as listed and agreed with the findings in the report. No results found.  ASSESSMENT & PLAN:   Carcinoma of upper-outer quadrant of left breast in female, estrogen receptor positive  # 2019 Stage IB multifocal LEFT breast cancer ER-positive; Her2 positive- on Tamoxifen [started in  Fall 2020].  S/p left mastectomy; right prophylactic mastectomy. July 2022 CT scan chest and pelvis negative for any metastatic disease; bilateral mastectomies and mild scarring noted in the left upper lobe/radiation-induced likely.  Stable.     # She continues adjuvant tamoxifen. Clinically no evidence of recurrent disease today. Continue zometa adjuvantly every 6 months x 3 years. She received #1 on  05/02/21. Plan to continue through 05/02/2024. Labs reviewed. Calcium normal.Continue dental care. No jaw pain.   # Tumor mark - wnl but varies. No imaging recommended unless significant trending or elevation or clinical symptoms.   # Hot flashes-G-1-2- [Effexor - dizziness]. Currently on Lexapro 20 mg a day. Stable.    # Premenopausal [July 2022]- pelvic ultrasound [IUD-cystic changes in the endometrium] in place; yearly follow-up with gynecology- stable.    # Mild Anemia- ? Iron def from menstrual cycles- #Recommend gentle iron [iron biglycinate; 28 mg ] 1 pill a day.  This pill is unlikely to cause stomach upset or cause constipation.     # CHEK- 2 mutations: Could consider risk reducing TLH-BSO OR BSO or bilateral salpingectomy. Defer to gynecology. s/p  April 11th, 2023- colonoscopy- NEG [Dr.Byrnett]- normal. Recommend cascade testing. Her oldest child is 24. She's undecided but will talk to daughter and let us know.   # Snoring- new. No weight gain. Refer to sleep center. Chest xray today.    # Screening for osteoporosis-  JUNE 2023- The BMD T-score of 0.0.  Normal continue calcium plus vitamin D [? 600 u].  Vit D MAY 2024- 48. Vitamin D is normal.    # Bil hand T/numbess: carpal tunnel- discussed re: Splint vs orthopedic surgery. Defer to PCP.    [#1 on Nov 14th, 2022- nov 2025]  my chart  # DISPOSITION: Zometa today Chest xray 6 mo- labs (cbc, cmp, ca 29-29, vit d, ferritin, iron) Few days later- Dr Donneta Romberg, zometa- la   No problem-specific Assessment & Plan notes found for this encounter.  All questions were answered. The patient knows to call the clinic with any problems, questions or concerns.    Alinda Dooms, NP 06/15/2023

## 2023-06-15 NOTE — Patient Instructions (Signed)

## 2023-06-18 ENCOUNTER — Encounter: Payer: Self-pay | Admitting: Internal Medicine

## 2023-09-27 ENCOUNTER — Other Ambulatory Visit: Payer: Self-pay | Admitting: Internal Medicine

## 2023-12-14 ENCOUNTER — Inpatient Hospital Stay: Payer: BC Managed Care – PPO | Attending: Internal Medicine

## 2023-12-14 DIAGNOSIS — C50412 Malignant neoplasm of upper-outer quadrant of left female breast: Secondary | ICD-10-CM | POA: Diagnosis present

## 2023-12-14 DIAGNOSIS — Z79810 Long term (current) use of selective estrogen receptor modulators (SERMs): Secondary | ICD-10-CM | POA: Insufficient documentation

## 2023-12-14 DIAGNOSIS — Z1731 Human epidermal growth factor receptor 2 positive status: Secondary | ICD-10-CM | POA: Diagnosis not present

## 2023-12-14 DIAGNOSIS — Z1721 Progesterone receptor positive status: Secondary | ICD-10-CM | POA: Insufficient documentation

## 2023-12-14 DIAGNOSIS — D649 Anemia, unspecified: Secondary | ICD-10-CM | POA: Diagnosis not present

## 2023-12-14 DIAGNOSIS — Z170 Estrogen receptor positive status [ER+]: Secondary | ICD-10-CM | POA: Diagnosis not present

## 2023-12-14 DIAGNOSIS — C50419 Malignant neoplasm of upper-outer quadrant of unspecified female breast: Secondary | ICD-10-CM

## 2023-12-14 LAB — CMP (CANCER CENTER ONLY)
ALT: 22 U/L (ref 0–44)
AST: 23 U/L (ref 15–41)
Albumin: 4 g/dL (ref 3.5–5.0)
Alkaline Phosphatase: 33 U/L — ABNORMAL LOW (ref 38–126)
Anion gap: 7 (ref 5–15)
BUN: 13 mg/dL (ref 6–20)
CO2: 25 mmol/L (ref 22–32)
Calcium: 8.9 mg/dL (ref 8.9–10.3)
Chloride: 105 mmol/L (ref 98–111)
Creatinine: 0.77 mg/dL (ref 0.44–1.00)
GFR, Estimated: >60 mL/min (ref >60)
Glucose, Bld: 99 mg/dL (ref 70–99)
Potassium: 4.1 mmol/L (ref 3.5–5.1)
Sodium: 137 mmol/L (ref 135–145)
Total Bilirubin: 0.7 mg/dL (ref 0.0–1.2)
Total Protein: 7.1 g/dL (ref 6.5–8.1)

## 2023-12-14 LAB — CBC WITH DIFFERENTIAL (CANCER CENTER ONLY)
Abs Immature Granulocytes: 0.03 10*3/uL (ref 0.00–0.07)
Basophils Absolute: 0.1 10*3/uL (ref 0.0–0.1)
Basophils Relative: 1 %
Eosinophils Absolute: 0.2 10*3/uL (ref 0.0–0.5)
Eosinophils Relative: 2 %
HCT: 40.6 % (ref 36.0–46.0)
Hemoglobin: 13.4 g/dL (ref 12.0–15.0)
Immature Granulocytes: 0 %
Lymphocytes Relative: 29 %
Lymphs Abs: 2.2 10*3/uL (ref 0.7–4.0)
MCH: 28.3 pg (ref 26.0–34.0)
MCHC: 33 g/dL (ref 30.0–36.0)
MCV: 85.7 fL (ref 80.0–100.0)
Monocytes Absolute: 0.6 10*3/uL (ref 0.1–1.0)
Monocytes Relative: 7 %
Neutro Abs: 4.6 10*3/uL (ref 1.7–7.7)
Neutrophils Relative %: 61 %
Platelet Count: 244 10*3/uL (ref 150–400)
RBC: 4.74 MIL/uL (ref 3.87–5.11)
RDW: 12.8 % (ref 11.5–15.5)
WBC Count: 7.6 10*3/uL (ref 4.0–10.5)
nRBC: 0 % (ref 0.0–0.2)

## 2023-12-14 LAB — IRON AND TIBC
Iron: 63 ug/dL (ref 28–170)
Saturation Ratios: 15 % (ref 10.4–31.8)
TIBC: 435 ug/dL (ref 250–450)
UIBC: 372 ug/dL

## 2023-12-14 LAB — FERRITIN: Ferritin: 26 ng/mL (ref 11–307)

## 2023-12-14 LAB — VITAMIN D 25 HYDROXY (VIT D DEFICIENCY, FRACTURES): Vit D, 25-Hydroxy: 50.05 ng/mL (ref 30–100)

## 2023-12-15 LAB — CANCER ANTIGEN 27.29: CA 27.29: 26.1 U/mL (ref 0.0–38.6)

## 2023-12-18 ENCOUNTER — Inpatient Hospital Stay: Payer: BC Managed Care – PPO

## 2023-12-18 ENCOUNTER — Encounter: Payer: Self-pay | Admitting: Internal Medicine

## 2023-12-18 ENCOUNTER — Inpatient Hospital Stay: Payer: BC Managed Care – PPO | Attending: Internal Medicine | Admitting: Internal Medicine

## 2023-12-18 VITALS — BP 124/82 | HR 68 | Temp 98.1°F | Resp 16 | Ht 66.0 in | Wt 185.0 lb

## 2023-12-18 DIAGNOSIS — D649 Anemia, unspecified: Secondary | ICD-10-CM | POA: Insufficient documentation

## 2023-12-18 DIAGNOSIS — Z1731 Human epidermal growth factor receptor 2 positive status: Secondary | ICD-10-CM | POA: Insufficient documentation

## 2023-12-18 DIAGNOSIS — Z79810 Long term (current) use of selective estrogen receptor modulators (SERMs): Secondary | ICD-10-CM | POA: Insufficient documentation

## 2023-12-18 DIAGNOSIS — C50412 Malignant neoplasm of upper-outer quadrant of left female breast: Secondary | ICD-10-CM | POA: Insufficient documentation

## 2023-12-18 DIAGNOSIS — Z1721 Progesterone receptor positive status: Secondary | ICD-10-CM | POA: Insufficient documentation

## 2023-12-18 DIAGNOSIS — Z170 Estrogen receptor positive status [ER+]: Secondary | ICD-10-CM | POA: Insufficient documentation

## 2023-12-18 NOTE — Progress Notes (Signed)
 [facility] CONSULT NOTE  Patient Care Team: Cyrus, Selinda Moose, PA-C as PCP - General (Family Medicine) Dessa, Reyes ORN, MD (General Surgery) Arloa Lamar SQUIBB, MD as Referring Physician (Obstetrics and Gynecology) Rudell Eleanor BROCKS, MD (Inactive) as Referring Physician (Hematology and Oncology) Cindie Jesusa HERO, RN as Oncology Nurse Navigator Lenn Aran, MD as Referring Physician (Radiation Oncology) Valora Agent, MD as Consulting Physician (Family Medicine) Rennie Cindy SAUNDERS, MD as Consulting Physician (Oncology)  CHIEF COMPLAINTS/PURPOSE OF CONSULTATION: Breast cancer  #  Oncology History Overview Note  Denise [name] is a 48 y.o. 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  Pathology revealed no residual carcinoma in the breast.  One of three sentinel lymph nodes were positive.  One lymph node had 2 metastatic foci (2.1 mm and 1 mm).  Pathologic stage revealed ypT0 ypN1a.   Index breast mass and axillary node biopsy on 12/06/2017 revealed grade II invasive ductal carcinoma with calcifications at the 11 o'clock position.  There was metastatic carcinoma in 1 of 1 lymph nodes.  Tumor was ER + (100%), PR + (100%), and Ki67 5%.  Her2/neu was 3+ by IHC (heterogeneous) and FISH +.   Diagnostic left mammogram and ultrasound on 12/06/2017 revealed a 1.7 x 1.3 x 1.3 cm irregular hypoechoic mass with internal calcifications at the 11 o'clock position 2 cm from the nipple with internal calcifications. There was a similar appearing 0.9 x 0.8 x 0.7 cm mass at the 11 o'clock position 4 cm from the nipple (2.4 cm from the index lesion).  There were suspicious, segmental calcifications originating from the index lesion and extending 5 cm posteriorly.  There was a 0.6 cm morphologically abnormal left axillary lymph node.   Bilateral breast MRI on  12/10/2017 revealed a papilloma in the right breast  The recently biopsied malignancy in the left breast (15 x 16 x 16 mm) was identified. The adjacent and more superiorly located 11 o'clock mass (7 x 12 mm) seen on recent ultrasound, 4 cm from the nipple on ultrasound, was also identified. There were 2 probable satellite lesions (7 mm, medial lesion; posterior and lateral lesion). The total span of malignancy was 3 x 3.1 x 4.5 cm.  There were calcifications extending up to 5 cm posterior to the biopsied malignancy which were highly suspicious on mammography but not appreciated.  There was a mass in the lower outer left breast measured 5 mm, representing a change.  The known metastatic node in the left axilla was identified. A node along the posterolateral margin of the pectoralis minor (cortex 5 mm) was nonspecific but at least somewhat suspicious.   Chest, abdomen, and pelvic CT on 12/19/2017 revealed a solitary enlarged biopsy-proven metastatic left axillary lymph node.  There were no additional findings suspicious for metastatic disease in the chest, abdomen or pelvis.  There was a nonspecific tiny sclerotic upper right sacral lesion, more likely a benign bone island. Bone scintigraphy correlation versus follow-up CT could be considered.   Bone scan on 01/11/2018 was negative for metastatic pattern. No areas of focal tracer uptake. Area of concern in sacrum on CT likely represents benign bone island.    Myriad genetic testing on 08/26/2013 was negative for BRCA1 and 2. 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  She has a family history significant for breast, ovarian, pancreatic, and prostate cancer.   She received 6 cycles  of TCHP chemotherapy (12/21/2017 - 04/17/2018) with On[facility] support. Cycle #3 was held due to elevated LFTs on 02/04/2018. She received Herceptin  + Perjeta  alone (04/30/2018 - 05/30/2018).  She is s/p cycle #1 Kadcyla  (06/20/2018).   She began breast  radiation on 06/25/2018    Echocardiograms:  12/20/2017 - EF of 60-65%, 03/27/2018 - EF of 60-65%, and 06/18/2018 - EF of 55-60%.   She has a history of elevated LFTs.  Hepatitis B and C serologies were negative on 02/04/2018.  RUQ ultrasound on 02/06/2018 revealed a small anterior gallbladder wall polyp. There was no evidence of cholelithiasis or cholecystis. CBD measured normal at 2 mm, with no evidence of choledocholithiasis. There was normal direction of blood flow towards the liver noted.   Carcinoma of upper-outer quadrant of left breast in female, estrogen receptor positive (HCC)  09/17/2014 Initial Diagnosis   Malignant neoplasm of upper-outer quadrant of female breast (HCC)   12/21/2017 - 04/18/2018 Chemotherapy   The patient had dexamethasone  (DECADRON ) 4 MG tablet, 8 mg, Oral, 2 times daily, 2 of 2 cycles palonosetron  (ALOXI ) injection 0.25 mg, 0.25 mg, Intravenous,  Once, 6 of 6 cycles Administration: 0.25 mg (12/21/2017), 0.25 mg (01/14/2018), 0.25 mg (03/28/2018), 0.25 mg (04/17/2018), 0.25 mg (02/11/2018), 0.25 mg (03/07/2018) pegfilgrastim  (NEULASTA  ONPRO KIT) injection 6 mg, 6 mg, Subcutaneous, Once, 1 of 1 cycle Administration: 6 mg (12/21/2017) pegfilgrastim -cbqv (UDENYCA ) injection 6 mg, 6 mg, Subcutaneous, Once, 6 of 6 cycles Administration: 6 mg (01/15/2018), 6 mg (03/29/2018), 6 mg (04/18/2018), 6 mg (02/12/2018), 6 mg (03/08/2018) trastuzumab  (HERCEPTIN ) 600 mg in sodium chloride  0.9 % 250 mL chemo infusion, 672 mg, Intravenous,  Once, 6 of 6 cycles Administration: 600 mg (12/21/2017), 450 mg (01/14/2018), 500 mg (03/28/2018), 500 mg (04/17/2018), 500 mg (02/11/2018), 500 mg (03/07/2018) CARBOplatin  (PARAPLATIN ) 870 mg in sodium chloride  0.9 % 500 mL chemo infusion, 890 mg (100 % of original dose 885 mg), Intravenous,  Once, 6 of 6 cycles Dose modification:   (original dose 885 mg, Cycle 1),   (original dose 885 mg, Cycle 2),   (original dose 885 mg, Cycle 5),   (original dose 885 mg,  Cycle 6),   (original dose 885 mg, Cycle 3),   (original dose 885 mg, Cycle 4) Administration: 870 mg (12/21/2017), 870 mg (01/14/2018), 870 mg (03/28/2018), 870 mg (04/17/2018), 870 mg (02/11/2018), 870 mg (03/07/2018) DOCEtaxel  (TAXOTERE ) 150 mg in sodium chloride  0.9 % 250 mL chemo infusion, 75 mg/m2 = 150 mg, Intravenous,  Once, 6 of 6 cycles Dose modification: 55 mg/m2 (original dose 75 mg/m2, Cycle 5, Reason: Change in LFTs, Comment: per pharmacy) Administration: 150 mg (12/21/2017), 150 mg (01/14/2018), 110 mg (03/28/2018), 150 mg (04/17/2018), 150 mg (02/11/2018), 150 mg (03/07/2018) pertuzumab  (PERJETA ) 840 mg in sodium chloride  0.9 % 250 mL chemo infusion, 840 mg, Intravenous, Once, 6 of 6 cycles Administration: 840 mg (12/21/2017), 420 mg (01/14/2018), 420 mg (03/28/2018), 420 mg (04/17/2018), 420 mg (02/11/2018), 420 mg (03/07/2018) fosaprepitant  (EMEND ) 150 mg, dexamethasone  (DECADRON ) 12 mg in sodium chloride  0.9 % 145 mL IVPB, , Intravenous,  Once, 6 of 6 cycles Administration:  (12/21/2017),  (01/14/2018),  (03/28/2018),  (04/17/2018),  (02/11/2018),  (03/07/2018)  for chemotherapy treatment.    05/09/2018 - 05/30/2018 Chemotherapy   The patient had trastuzumab  (HERCEPTIN ) 500 mg in sodium chloride  0.9 % 250 mL chemo infusion, 504 mg, Intravenous,  Once, 2 of 11 cycles Administration: 500 mg (05/30/2018), 500 mg (05/09/2018) pertuzumab  (PERJETA ) 420 mg in sodium chloride  0.9 % 250 mL chemo infusion,  420 mg, Intravenous, Once, 2 of 11 cycles Administration: 420 mg (05/30/2018), 420 mg (05/09/2018)  for chemotherapy treatment.    01/17/2019 - 02/14/2019 Chemotherapy   Patient is on Treatment Plan : BREAST (Kanjinti ) Trastuzumab  q21d       HISTORY OF PRESENTING ILLNESS: Ambulating independently.  Alone.  ZONA PEDRO 48 y.o.  female with a history of stage Ib ER/PR positive HER2/neu positive breast cancer currently on tamoxifen - on zometa  q 6 M adjuvant is here for follow-up/review results of  tumor marker.  C/o hands having more numbness and tingling.   Patient doing well. Intermittent  hot flashes. She continues to have Escitalopram . Continues to be compliance with Tamoxifen .   Patient denies any new onset of bone pain or joint pains.   Not any worse.  No shortness of breath or cough.  Denies any jaw pain.  Review of Systems  Constitutional:  Negative for chills, diaphoresis, fever, malaise/fatigue and weight loss.  HENT:  Negative for nosebleeds and sore throat.   Eyes:  Negative for double vision.  Respiratory:  Negative for cough, hemoptysis, sputum production, shortness of breath and wheezing.   Cardiovascular:  Negative for chest pain, palpitations, orthopnea and leg swelling.  Gastrointestinal:  Negative for abdominal pain, blood in stool, constipation, diarrhea, heartburn, melena, nausea and vomiting.  Genitourinary:  Negative for dysuria, frequency and urgency.  Musculoskeletal:  Negative for back pain and joint pain.  Skin: Negative.  Negative for itching and rash.  Neurological:  Negative for dizziness, tingling, focal weakness, weakness and headaches.  Endo/Heme/Allergies:  Does not bruise/bleed easily.  Psychiatric/Behavioral:  Negative for depression. The patient is not nervous/anxious and does not have insomnia.      MEDICAL HISTORY:  Past Medical History:  Diagnosis Date   Abnormal breast biopsy    PASH, Dr. Dessa   Breast cancer [facility]) 11/2017   left breast; ER/PR/Her2neu POS   Family history of breast cancer    Family history of ovarian cancer    Fibroadenoma of breast, left 2000, 2016   Genetic screening 08/26/2013   BRCA/BART negative/Myriad, CHEK2 POS 2017   Hypertension    Increased risk of breast cancer 2017   IBIS=47%   Left breast lump 06/18/2017   Fluid/drained [name]   Migraine    Monoallelic mutation of CHEK2 gene in female patient 2017   increased risk of breast and colon cancer   Personal history of chemotherapy    Personal  history of radiation therapy     SURGICAL HISTORY: Past Surgical History:  Procedure Laterality Date   BREAST BIOPSY Bilateral 09/08/14   neg   BREAST BIOPSY Left 03/16/2015   Procedure: BREAST BIOPSY WITH NEEDLE LOCALIZATION;  Surgeon: Reyes LELON Dessa, MD;  Location: [facility];  Service: General;  Laterality: Left;   BREAST BIOPSY Left 11/2017   positive   BREAST CYST ASPIRATION Left 06/18/2017   cyst aspiration   BREAST EXCISIONAL BIOPSY Left 09/2014   BREAST EXCISIONAL BIOPSY Right    age 75's   BREAST RECONSTRUCTION WITH PLACEMENT OF TISSUE EXPANDER AND FLEX HD (ACELLULAR HYDRATED DERMIS) Left 05/20/2018   Procedure: BREAST RECONSTRUCTION WITH PLACEMENT OF TISSUE EXPANDER AND FLEX HD (ACELLULAR HYDRATED DERMIS);  Surgeon: Lowery Estefana RAMAN, DO;  Location: [facility];  Service: Plastics;  Laterality: Left;   BREAST RECONSTRUCTION WITH PLACEMENT OF TISSUE EXPANDER AND FLEX HD (ACELLULAR HYDRATED DERMIS) Right 12/09/2018   Procedure: BREAST RECONSTRUCTION WITH PLACEMENT OF TISSUE EXPANDER AND FLEX HD (ACELLULAR HYDRATED DERMIS);  Surgeon:  Dillingham, Estefana RAMAN, DO;  Location: [facility];  Service: Plastics;  Laterality: Right;   BREAST SURGERY Right March 2000   benign fibroadenoma   BREAST SURGERY Left 08/08/13   excision   BREAST SURGERY Left 09/23/14   excision   COLONOSCOPY WITH PROPOFOL  N/A 09/28/2021   Procedure: COLONOSCOPY WITH PROPOFOL ;  Surgeon: Dessa Reyes ORN, MD;  Location: [facility];  Service: Endoscopy;  Laterality: N/A;   ESOPHAGOGASTRODUODENOSCOPY (EGD) WITH PROPOFOL  N/A 01/20/2019   Procedure: ESOPHAGOGASTRODUODENOSCOPY (EGD) WITH PROPOFOL ;  Surgeon: Unk Corinn Skiff, MD;  Location: [facility];  Service: Gastroenterology;  Laterality: N/A;   MASTECTOMY Left 05/20/2018   MASTECTOMY W/ SENTINEL NODE BIOPSY Left 05/20/2018   Procedure: MASTECTOMY WITH SENTINEL LYMPH NODE BIOPSY;  Surgeon: Dessa Reyes ORN, MD;  Location: [facility];  Service: General;  Laterality:  Left;   PORT-A-CATH REMOVAL Right 03/31/2019   Procedure: REMOVAL PORT-A-CATH;  Surgeon: Lowery Estefana RAMAN, DO;  Location: [facility];  Service: Plastics;  Laterality: Right;   PORTACATH PLACEMENT Right 12/17/2017   Procedure: INSERTION PORT-A-CATH;  Surgeon: Dessa Reyes ORN, MD;  Location: [facility];  Service: General;  Laterality: Right;   REMOVAL OF BILATERAL TISSUE EXPANDERS WITH PLACEMENT OF BILATERAL BREAST IMPLANTS Bilateral 03/31/2019   Procedure: REMOVAL OF BILATERAL TISSUE EXPANDERS WITH PLACEMENT OF BILATERAL BREAST IMPLANTS;  Surgeon: Lowery Estefana RAMAN, DO;  Location: [facility];  Service: Plastics;  Laterality: Bilateral;  2 hours   SIMPLE MASTECTOMY WITH AXILLARY SENTINEL NODE BIOPSY Right 12/09/2018   Procedure: SIMPLE MASTECTOMY RIGHT;  Surgeon: Dessa Reyes ORN, MD;  Location: [facility];  Service: General;  Laterality: Right;    SOCIAL HISTORY: Social History   Socioeconomic History   Marital status: Married    Spouse name: Not on file   Number of children: Not on file   Years of education: Not on file   Highest education level: Not on file  Occupational History   Not on file  Tobacco Use   Smoking status: Never   Smokeless tobacco: Never  Vaping Use   Vaping status: Never Used  Substance and Sexual Activity   Alcohol use: Yes    Comment: occasionally   Drug use: No   Sexual activity: Yes    Birth control/protection: I.U.D.    Comment: Paragard   Other Topics Concern   Not on file  Social History Narrative   Not on file   Social Drivers of Health   Financial Resource Strain: Low Risk  (01/08/2019)   Overall Financial Resource Strain (CARDIA)    Difficulty of Paying Living Expenses: Not very hard  Food Insecurity: No Food Insecurity (01/08/2019)   Hunger Vital Sign    Worried About Running Out of Food in the Last Year: Never true    Ran Out of Food in the Last Year: Never true  Transportation Needs: Unknown (01/08/2019)   PRAPARE - Therapist, art (Medical): No    Lack of Transportation (Non-Medical): Not on file  Physical Activity: Sufficiently Active (08/06/2017)   Exercise Vital Sign    Days of Exercise per Week: 3 days    Minutes of Exercise per Session: 60 min  Stress: No Stress Concern Present (08/06/2017)   [employer] of Occupational Health - Occupational Stress Questionnaire    Feeling of Stress : Only a little  Social Connections: Moderately Integrated (08/06/2017)   Social Connection and Isolation Panel    Frequency of Communication with Friends and Family: Twice a week  Frequency of Social Gatherings with Friends and Family: Once a week    Attends Religious Services: More than 4 times per year    Active Member of [employer] or Organizations: No    Attends Banker Meetings: Never    Marital Status: Married  Catering manager Violence: Not At Risk (08/06/2017)   Humiliation, Afraid, Rape, and Kick questionnaire    Fear of Current or Ex-Partner: No    Emotionally Abused: No    Physically Abused: No    Sexually Abused: No    FAMILY HISTORY: Family History  Problem Relation Age of Onset   Skin cancer Mother    Prostate cancer Father 41       requiring tx/hormones   Depression Sister    Breast cancer Paternal Grandmother 23   Breast cancer Maternal Aunt 60   Ovarian cancer Maternal Aunt 70   Pancreatic cancer Maternal Aunt 75   Breast cancer Paternal Aunt 56   Stroke Maternal Grandmother    Stroke Maternal Grandfather    Alcohol abuse Paternal Grandfather    Prostate cancer Maternal Uncle    Alcohol abuse Paternal Uncle    Anxiety disorder Paternal Uncle     ALLERGIES:  is allergic to steri-strip compound benzoin [benzoin], meloxicam , silicone, and tape.  MEDICATIONS:  Current Outpatient Medications  Medication Sig Dispense Refill   acetaminophen  (TYLENOL ) 500 MG tablet Take 1,000 mg by mouth every 6 (six) hours as needed for moderate pain or headache.      Calcium  Carbonate (CALCIUM 600 PO) Take 1 tablet by mouth daily.     cholecalciferol (VITAMIN D3) 25 MCG (1000 UNIT) tablet Take 3,000 Units by mouth daily.     escitalopram  (LEXAPRO ) 20 MG tablet Take 1 tablet by mouth once daily 90 tablet 0   tamoxifen  (NOLVADEX ) 20 MG tablet Take 1 tablet (20 mg total) by mouth daily. 90 tablet 3   loratadine (CLARITIN) 10 MG tablet Take 10 mg by mouth daily. (Patient not taking: Reported on 12/18/2023)     vitamin C (ASCORBIC ACID) 500 MG tablet Take 500 mg by mouth daily. (Patient not taking: Reported on 12/18/2023)     Current Facility-Administered Medications  Medication Dose Route Frequency Provider Last Rate Last Admin   paragard  intrauterine copper  IUD   Intrauterine Once Copland, Alicia B, PA-C       Facility-Administered Medications Ordered in Other Visits  Medication Dose Route Frequency Provider Last Rate Last Admin   sodium chloride  flush (NS) 0.9 % injection 10 mL  10 mL Intravenous PRN Corcoran, Melissa C, MD   10 mL at 04/17/18 0844      .  PHYSICAL EXAMINATION: ECOG PERFORMANCE STATUS: 0 - Asymptomatic  Vitals:   12/18/23 1424  BP: 124/82  Pulse: 68  Resp: 16  Temp: 98.1 F (36.7 C)  SpO2: 100%   Filed Weights   12/18/23 1424  Weight: 185 lb (83.9 kg)    Physical Exam Vitals and nursing note reviewed.  Constitutional:      Comments: Alone; ambulating independently  HENT:     Head: Normocephalic and atraumatic.     Mouth/Throat:     Mouth: Mucous membranes are moist.     Pharynx: Oropharynx is clear. No oropharyngeal exudate.   Eyes:     Extraocular Movements: Extraocular movements intact.     Pupils: Pupils are equal, round, and reactive to light.    Cardiovascular:     Rate and Rhythm: Normal rate and regular rhythm.  Pulmonary:     Effort: No respiratory distress.     Breath sounds: No wheezing.  Abdominal:     General: Bowel sounds are normal. There is no distension.     Palpations: Abdomen is soft. There is no  mass.     Tenderness: There is no abdominal tenderness. There is no guarding or rebound.   Musculoskeletal:        General: No tenderness. Normal range of motion.     Cervical back: Normal range of motion and neck supple.   Skin:    General: Skin is warm.   Neurological:     General: No focal deficit present.     Mental Status: She is alert and oriented to person, place, and time.   Psychiatric:        Mood and Affect: Affect normal.        Behavior: Behavior normal.        Judgment: Judgment normal.     Latest Reference Range & Units Most Recent 05/09/19 09:50 08/12/19 08:18 12/04/19 13:10 04/08/20 13:51 08/05/20 13:29 01/04/21 10:05 04/22/21 09:58 08/19/21 10:45  CA 27.29 0.0 - 38.6 U/mL 22.9 08/19/21 10:45 21.9 19.9 18.3 16.8 17.7 22.9 19.7 22.9    LABORATORY DATA:  I have reviewed the data as listed Lab Results  Component Value Date   WBC 7.6 12/14/2023   HGB 13.4 12/14/2023   HCT 40.6 12/14/2023   MCV 85.7 12/14/2023   PLT 244 12/14/2023   Recent Labs    06/07/23 1557 12/14/23 1422  NA 138 137  K 3.6 4.1  CL 101 105  CO2 27 25  GLUCOSE 98 99  BUN 12 13  CREATININE 0.71 0.77  CALCIUM 9.1 8.9  GFRNONAA >60 >60  PROT 6.8 7.1  ALBUMIN 4.0 4.0  AST 27 23  ALT 28 22  ALKPHOS 32* 33*  BILITOT 0.6 0.7    RADIOGRAPHIC STUDIES: I have personally reviewed the radiological images as listed and agreed with the findings in the report. No results found.  ASSESSMENT & PLAN:   Carcinoma of upper-outer quadrant of left breast in female, estrogen receptor positive (HCC) # 2019 Stage IB multifocal LEFT breast cancer ER-positive; Her2 positive- on Tamoxifen  [started in  Fall 2020].  S/p left mastectomy; right prophylactic mastectomy. July 2022 CT scan chest and pelvis negative for any metastatic disease; bilateral mastectomies and mild scarring noted in the left upper lobe/radiation-induced likely.   Stable.    #Patient continues to be on adjuvant tamoxifen  [until Fall  of 2030].  No concerns for any recurrence. Continue Zometa  adjuvantly every 6 months x 3 years. [#1 on Nov 14th, 2020].  No jaw pain.  Calcium normal.  #Tumor marker: Within normal limits; however up-and-down.  Informed the patient that this is unlikely to be concerning.  Would recommend holding off on imaging unless significantly elevated; or an abnormal trend noted.    # Hot flashes-G-1-2;  [Effexor  -dizziness]; currently on Lexapro  20 mg a day- stable.   #  Premenopausal [July 2022]-pelvic ultrasound[IUD-cystic changes in the endometrium] in place; yearly follow-up with gynecology- stable.   # CHEK- 2 mutations: s/p  April 11th, 2023- colonoscopy- NEG [Dr.Byrnett]-reviewed normal.   # Screening for osteoporosis-JUNE 2023- The BMD T-score of 0.0.  Normal continue calcium plus vitamin D  [? 600 u].  Vit D MAY 2024- 48. Check BMD in 12 month- ordered.   # Bil hand T/numbess: carpal tunnel- discussed re: Splint vs orthopedic surgery. Defer to PCP.   [#  1 on Nov 14th, 2020- x7 STOP]  my chart # DISPOSITION: # NO zometa  today # follow up in 6 months- MD; 3 days prior-labs-cbc/cmp/ca-27-29;Vit D 25 OH levels; - Dr.B   All questions were answered. The patient knows to call the clinic with any problems, questions or concerns.     Cindy JONELLE Joe, MD 12/18/2023 3:13 PM

## 2023-12-18 NOTE — Assessment & Plan Note (Signed)
#   2019 Stage IB multifocal LEFT breast cancer ER-positive; Her2 positive- on Tamoxifen  [started in  Fall 2020].  S/p left mastectomy; right prophylactic mastectomy. July 2022 CT scan chest and pelvis negative for any metastatic disease; bilateral mastectomies and mild scarring noted in the left upper lobe/radiation-induced likely.   Stable.    #Patient continues to be on adjuvant tamoxifen  [until Fall of 2030].  No concerns for any recurrence. Continue Zometa  adjuvantly every 6 months x 3 years. [#1 on Nov 14th, 2020].  No jaw pain.  Calcium normal.  #Tumor marker: Within normal limits; however up-and-down.  Informed the patient that this is unlikely to be concerning.  Would recommend holding off on imaging unless significantly elevated; or an abnormal trend noted.    # Hot flashes-G-1-2;  [Effexor  -dizziness]; currently on Lexapro  20 mg a day- stable.   #  Premenopausal [July 2022]-pelvic ultrasound[IUD-cystic changes in the endometrium] in place; yearly follow-up with gynecology- stable.   # CHEK- 2 mutations: s/p  April 11th, 2023- colonoscopy- NEG [Dr.Byrnett]-reviewed normal.   # Screening for osteoporosis-JUNE 2023- The BMD T-score of 0.0.  Normal continue calcium plus vitamin D  [? 600 u].  Vit D MAY 2024- 48. Check BMD in 12 month- ordered.   # Bil hand T/numbess: carpal tunnel- discussed re: Splint vs orthopedic surgery. Defer to PCP.   [#1 on Nov 14th, 2020- x7 STOP]  my chart # DISPOSITION: # NO zometa  today # follow up in 6 months- MD; 3 days prior-labs-cbc/cmp/ca-27-29;Vit D 25 OH levels; - Dr.B

## 2023-12-18 NOTE — Progress Notes (Signed)
 C/o hands having more numbness and tingling.

## 2024-01-11 ENCOUNTER — Other Ambulatory Visit: Payer: Self-pay | Admitting: Internal Medicine

## 2024-04-12 ENCOUNTER — Other Ambulatory Visit: Payer: Self-pay | Admitting: Internal Medicine

## 2024-04-14 ENCOUNTER — Encounter: Payer: Self-pay | Admitting: Internal Medicine

## 2024-06-18 ENCOUNTER — Inpatient Hospital Stay

## 2024-06-20 ENCOUNTER — Telehealth: Payer: Self-pay | Admitting: Internal Medicine

## 2024-06-20 NOTE — Telephone Encounter (Signed)
 Called patient to reschedule appointment due to missing lab appointment. Left her a voicemail- rescheduled labs to original MD appointment time and rescheduled MD appointment. Informed patient she can see these on mychart and to call if she had any questions.

## 2024-06-23 ENCOUNTER — Inpatient Hospital Stay: Admitting: Internal Medicine

## 2024-06-23 ENCOUNTER — Inpatient Hospital Stay

## 2024-06-24 ENCOUNTER — Inpatient Hospital Stay: Attending: Internal Medicine

## 2024-06-24 DIAGNOSIS — Z1731 Human epidermal growth factor receptor 2 positive status: Secondary | ICD-10-CM | POA: Diagnosis not present

## 2024-06-24 DIAGNOSIS — Z79810 Long term (current) use of selective estrogen receptor modulators (SERMs): Secondary | ICD-10-CM | POA: Insufficient documentation

## 2024-06-24 DIAGNOSIS — Z1721 Progesterone receptor positive status: Secondary | ICD-10-CM | POA: Insufficient documentation

## 2024-06-24 DIAGNOSIS — Z8041 Family history of malignant neoplasm of ovary: Secondary | ICD-10-CM | POA: Insufficient documentation

## 2024-06-24 DIAGNOSIS — R232 Flushing: Secondary | ICD-10-CM | POA: Diagnosis not present

## 2024-06-24 DIAGNOSIS — Z803 Family history of malignant neoplasm of breast: Secondary | ICD-10-CM | POA: Insufficient documentation

## 2024-06-24 DIAGNOSIS — Z800 Family history of malignant neoplasm of digestive organs: Secondary | ICD-10-CM | POA: Diagnosis not present

## 2024-06-24 DIAGNOSIS — Z170 Estrogen receptor positive status [ER+]: Secondary | ICD-10-CM | POA: Insufficient documentation

## 2024-06-24 DIAGNOSIS — R5383 Other fatigue: Secondary | ICD-10-CM | POA: Diagnosis not present

## 2024-06-24 DIAGNOSIS — C50412 Malignant neoplasm of upper-outer quadrant of left female breast: Secondary | ICD-10-CM | POA: Insufficient documentation

## 2024-06-24 LAB — CBC WITH DIFFERENTIAL (CANCER CENTER ONLY)
Abs Immature Granulocytes: 0.02 K/uL (ref 0.00–0.07)
Basophils Absolute: 0 K/uL (ref 0.0–0.1)
Basophils Relative: 1 %
Eosinophils Absolute: 0.2 K/uL (ref 0.0–0.5)
Eosinophils Relative: 3 %
HCT: 37.4 % (ref 36.0–46.0)
Hemoglobin: 12.4 g/dL (ref 12.0–15.0)
Immature Granulocytes: 0 %
Lymphocytes Relative: 39 %
Lymphs Abs: 2.6 K/uL (ref 0.7–4.0)
MCH: 27.9 pg (ref 26.0–34.0)
MCHC: 33.2 g/dL (ref 30.0–36.0)
MCV: 84.2 fL (ref 80.0–100.0)
Monocytes Absolute: 0.5 K/uL (ref 0.1–1.0)
Monocytes Relative: 7 %
Neutro Abs: 3.4 K/uL (ref 1.7–7.7)
Neutrophils Relative %: 50 %
Platelet Count: 222 K/uL (ref 150–400)
RBC: 4.44 MIL/uL (ref 3.87–5.11)
RDW: 13.8 % (ref 11.5–15.5)
WBC Count: 6.8 K/uL (ref 4.0–10.5)
nRBC: 0 % (ref 0.0–0.2)

## 2024-06-24 LAB — VITAMIN D 25 HYDROXY (VIT D DEFICIENCY, FRACTURES): Vit D, 25-Hydroxy: 49.6 ng/mL (ref 30–100)

## 2024-06-24 LAB — CMP (CANCER CENTER ONLY)
ALT: 18 U/L (ref 0–44)
AST: 23 U/L (ref 15–41)
Albumin: 4.3 g/dL (ref 3.5–5.0)
Alkaline Phosphatase: 43 U/L (ref 38–126)
Anion gap: 10 (ref 5–15)
BUN: 11 mg/dL (ref 6–20)
CO2: 25 mmol/L (ref 22–32)
Calcium: 9.1 mg/dL (ref 8.9–10.3)
Chloride: 102 mmol/L (ref 98–111)
Creatinine: 0.69 mg/dL (ref 0.44–1.00)
GFR, Estimated: >60 mL/min
Glucose, Bld: 79 mg/dL (ref 70–99)
Potassium: 4.1 mmol/L (ref 3.5–5.1)
Sodium: 138 mmol/L (ref 135–145)
Total Bilirubin: 0.5 mg/dL (ref 0.0–1.2)
Total Protein: 7.2 g/dL (ref 6.5–8.1)

## 2024-06-25 LAB — CANCER ANTIGEN 27.29: CA 27.29: 23.3 U/mL (ref 0.0–38.6)

## 2024-06-26 ENCOUNTER — Encounter: Payer: Self-pay | Admitting: Nurse Practitioner

## 2024-06-26 ENCOUNTER — Inpatient Hospital Stay: Admitting: Nurse Practitioner

## 2024-06-26 VITALS — BP 126/81 | HR 76 | Temp 97.3°F | Resp 16 | Wt 184.0 lb

## 2024-06-26 DIAGNOSIS — Z79810 Long term (current) use of selective estrogen receptor modulators (SERMs): Secondary | ICD-10-CM

## 2024-06-26 DIAGNOSIS — Z853 Personal history of malignant neoplasm of breast: Secondary | ICD-10-CM

## 2024-06-26 DIAGNOSIS — Z5181 Encounter for therapeutic drug level monitoring: Secondary | ICD-10-CM

## 2024-06-26 DIAGNOSIS — C50412 Malignant neoplasm of upper-outer quadrant of left female breast: Secondary | ICD-10-CM | POA: Diagnosis not present

## 2024-06-26 DIAGNOSIS — Z08 Encounter for follow-up examination after completed treatment for malignant neoplasm: Secondary | ICD-10-CM

## 2024-06-26 NOTE — Addendum Note (Signed)
 Addended by: Teghan Philbin G on: 06/26/2024 03:59 PM   Modules accepted: Orders

## 2024-06-26 NOTE — Progress Notes (Signed)
 [facility] CONSULT NOTE  Patient Care Team: Cyrus, Selinda Moose, PA-C as PCP - General (Family Medicine) Dessa, Reyes ORN, MD (General Surgery) Arloa Lamar SQUIBB, MD as Referring Physician (Obstetrics and Gynecology) [name] Eleanor BROCKS, MD (Inactive) as Referring Physician (Hematology and Oncology) Cindie Jesusa HERO, RN as Oncology Nurse Navigator Lenn Aran, MD as Referring Physician (Radiation Oncology) Valora Lynwood FALCON, MD as Consulting Physician (Family Medicine) Rennie Cindy SAUNDERS, MD as Consulting Physician (Oncology)  CHIEF COMPLAINTS/PURPOSE OF CONSULTATION: Breast cancer  #  Oncology History Overview Note  Denise [name] is a 49 y.o. 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  Pathology revealed no residual carcinoma in the breast.  One of three sentinel lymph nodes were positive.  One lymph node had 2 metastatic foci (2.1 mm and 1 mm).  Pathologic stage revealed ypT0 ypN1a.   Index breast mass and axillary node biopsy on 12/06/2017 revealed grade II invasive ductal carcinoma with calcifications at the 11 o'clock position.  There was metastatic carcinoma in 1 of 1 lymph nodes.  Tumor was ER + (100%), PR + (100%), and Ki67 5%.  Her2/neu was 3+ by IHC (heterogeneous) and FISH +.   Diagnostic left mammogram and ultrasound on 12/06/2017 revealed a 1.7 x 1.3 x 1.3 cm irregular hypoechoic mass with internal calcifications at the 11 o'clock position 2 cm from the nipple with internal calcifications. There was a similar appearing 0.9 x 0.8 x 0.7 cm mass at the 11 o'clock position 4 cm from the nipple (2.4 cm from the index lesion).  There were suspicious, segmental calcifications originating from the index lesion and extending 5 cm posteriorly.  There was a 0.6 cm morphologically abnormal left axillary lymph node.   Bilateral breast MRI on  12/10/2017 revealed a papilloma in the right breast  The recently biopsied malignancy in the left breast (15 x 16 x 16 mm) was identified. The adjacent and more superiorly located 11 o'clock mass (7 x 12 mm) seen on recent ultrasound, 4 cm from the nipple on ultrasound, was also identified. There were 2 probable satellite lesions (7 mm, medial lesion; posterior and lateral lesion). The total span of malignancy was 3 x 3.1 x 4.5 cm.  There were calcifications extending up to 5 cm posterior to the biopsied malignancy which were highly suspicious on mammography but not appreciated.  There was a mass in the lower outer left breast measured 5 mm, representing a change.  The known metastatic node in the left axilla was identified. A node along the posterolateral margin of the pectoralis minor (cortex 5 mm) was nonspecific but at least somewhat suspicious.   Chest, abdomen, and pelvic CT on 12/19/2017 revealed a solitary enlarged biopsy-proven metastatic left axillary lymph node.  There were no additional findings suspicious for metastatic disease in the chest, abdomen or pelvis.  There was a nonspecific tiny sclerotic upper right sacral lesion, more likely a benign bone island. Bone scintigraphy correlation versus follow-up CT could be considered.   Bone scan on 01/11/2018 was negative for metastatic pattern. No areas of focal tracer uptake. Area of concern in sacrum on CT likely represents benign bone island.    [facility] genetic testing on 08/26/2013 was negative for BRCA1 and 2. 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  She has a family history significant for breast, ovarian, pancreatic, and prostate cancer.   She received 6  cycles of TCHP chemotherapy (12/21/2017 - 04/17/2018) with Onita support. Cycle #3 was held due to elevated LFTs on 02/04/2018. She received Herceptin  + Perjeta  alone (04/30/2018 - 05/30/2018).  She is s/p cycle #1 Kadcyla  (06/20/2018).   She began breast  radiation on 06/25/2018    Echocardiograms:  12/20/2017 - EF of 60-65%, 03/27/2018 - EF of 60-65%, and 06/18/2018 - EF of 55-60%.   She has a history of elevated LFTs.  Hepatitis B and C serologies were negative on 02/04/2018.  RUQ ultrasound on 02/06/2018 revealed a small anterior gallbladder wall polyp. There was no evidence of cholelithiasis or cholecystis. CBD measured normal at 2 mm, with no evidence of choledocholithiasis. There was normal direction of blood flow towards the liver noted.   Carcinoma of upper-outer quadrant of left breast in female, estrogen receptor positive (HCC)  09/17/2014 Initial Diagnosis   Malignant neoplasm of upper-outer quadrant of female breast (HCC)   12/21/2017 - 04/18/2018 Chemotherapy   The patient had dexamethasone  (DECADRON ) 4 MG tablet, 8 mg, Oral, 2 times daily, 2 of 2 cycles palonosetron  (ALOXI ) injection 0.25 mg, 0.25 mg, Intravenous,  Once, 6 of 6 cycles Administration: 0.25 mg (12/21/2017), 0.25 mg (01/14/2018), 0.25 mg (03/28/2018), 0.25 mg (04/17/2018), 0.25 mg (02/11/2018), 0.25 mg (03/07/2018) pegfilgrastim  (NEULASTA  ONPRO KIT) injection 6 mg, 6 mg, Subcutaneous, Once, 1 of 1 cycle Administration: 6 mg (12/21/2017) pegfilgrastim -cbqv (UDENYCA ) injection 6 mg, 6 mg, Subcutaneous, Once, 6 of 6 cycles Administration: 6 mg (01/15/2018), 6 mg (03/29/2018), 6 mg (04/18/2018), 6 mg (02/12/2018), 6 mg (03/08/2018) trastuzumab  (HERCEPTIN ) 600 mg in sodium chloride  0.9 % 250 mL chemo infusion, 672 mg, Intravenous,  Once, 6 of 6 cycles Administration: 600 mg (12/21/2017), 450 mg (01/14/2018), 500 mg (03/28/2018), 500 mg (04/17/2018), 500 mg (02/11/2018), 500 mg (03/07/2018) CARBOplatin  (PARAPLATIN ) 870 mg in sodium chloride  0.9 % 500 mL chemo infusion, 890 mg (100 % of original dose 885 mg), Intravenous,  Once, 6 of 6 cycles Dose modification:   (original dose 885 mg, Cycle 1),   (original dose 885 mg, Cycle 2),   (original dose 885 mg, Cycle 5),   (original dose 885 mg,  Cycle 6),   (original dose 885 mg, Cycle 3),   (original dose 885 mg, Cycle 4) Administration: 870 mg (12/21/2017), 870 mg (01/14/2018), 870 mg (03/28/2018), 870 mg (04/17/2018), 870 mg (02/11/2018), 870 mg (03/07/2018) DOCEtaxel  (TAXOTERE ) 150 mg in sodium chloride  0.9 % 250 mL chemo infusion, 75 mg/m2 = 150 mg, Intravenous,  Once, 6 of 6 cycles Dose modification: 55 mg/m2 (original dose 75 mg/m2, Cycle 5, Reason: Change in LFTs, Comment: per pharmacy) Administration: 150 mg (12/21/2017), 150 mg (01/14/2018), 110 mg (03/28/2018), 150 mg (04/17/2018), 150 mg (02/11/2018), 150 mg (03/07/2018) pertuzumab  (PERJETA ) 840 mg in sodium chloride  0.9 % 250 mL chemo infusion, 840 mg, Intravenous, Once, 6 of 6 cycles Administration: 840 mg (12/21/2017), 420 mg (01/14/2018), 420 mg (03/28/2018), 420 mg (04/17/2018), 420 mg (02/11/2018), 420 mg (03/07/2018) fosaprepitant  (EMEND ) 150 mg, dexamethasone  (DECADRON ) 12 mg in sodium chloride  0.9 % 145 mL IVPB, , Intravenous,  Once, 6 of 6 cycles Administration:  (12/21/2017),  (01/14/2018),  (03/28/2018),  (04/17/2018),  (02/11/2018),  (03/07/2018)  for chemotherapy treatment.    05/09/2018 - 05/30/2018 Chemotherapy   The patient had trastuzumab  (HERCEPTIN ) 500 mg in sodium chloride  0.9 % 250 mL chemo infusion, 504 mg, Intravenous,  Once, 2 of 11 cycles Administration: 500 mg (05/30/2018), 500 mg (05/09/2018) pertuzumab  (PERJETA ) 420 mg in sodium chloride  0.9 % 250 mL chemo  infusion, 420 mg, Intravenous, Once, 2 of 11 cycles Administration: 420 mg (05/30/2018), 420 mg (05/09/2018)  for chemotherapy treatment.    01/17/2019 - 02/14/2019 Chemotherapy   Patient is on Treatment Plan : BREAST (Kanjinti ) Trastuzumab  q21d       HISTORY OF PRESENTING ILLNESS: Ambulating independently.  Alone.  SCHERRIE SENECA 49 y.o.  female with a history of stage Ib ER/PR positive HER2/neu positive breast cancer currently on tamoxifen - on zometa  q 6 M adjuvant is here for follow-up/review results of  tumor marker. Feels well. Has some hot flashes and fatigue which she accounts to tamoxifen  and working 3 jobs. Denies heavy or abnormal vaginal bleeding, cramping, bloating. No unintentional weight loss, new lumps or bumps. Denies shortness of breath. No jaw pain. Denies new onset bone or joint pain. Feels well and denies complaints.   Review of Systems  Constitutional:  Negative for chills, diaphoresis, fever, malaise/fatigue and weight loss.  HENT:  Negative for nosebleeds and sore throat.   Eyes:  Negative for double vision.  Respiratory:  Negative for cough, hemoptysis, sputum production, shortness of breath and wheezing.   Cardiovascular:  Negative for chest pain, palpitations, orthopnea and leg swelling.  Gastrointestinal:  Negative for abdominal pain, blood in stool, constipation, diarrhea, heartburn, melena, nausea and vomiting.  Genitourinary:  Negative for dysuria, frequency and urgency.  Musculoskeletal:  Negative for back pain and joint pain.  Skin: Negative.  Negative for itching and rash.  Neurological:  Negative for dizziness, tingling, focal weakness, weakness and headaches.  Endo/Heme/Allergies:  Does not bruise/bleed easily.  Psychiatric/Behavioral:  Negative for depression. The patient is not nervous/anxious and does not have insomnia.      MEDICAL HISTORY:  Past Medical History:  Diagnosis Date   Abnormal breast biopsy    PASH, Dr. Dessa   Breast cancer [facility]) 11/2017   left breast; ER/PR/Her2neu POS   Family history of breast cancer    Family history of ovarian cancer    Fibroadenoma of breast, left 2000, 2016   Genetic screening 08/26/2013   BRCA/BART negative/[facility], CHEK2 POS 2017   Hypertension    Increased risk of breast cancer 2017   IBIS=47%   Left breast lump 06/18/2017   Fluid/drained [name]   Migraine    Monoallelic mutation of CHEK2 gene in female patient 2017   increased risk of breast and colon cancer   Personal history of chemotherapy     Personal history of radiation therapy     SURGICAL HISTORY: Past Surgical History:  Procedure Laterality Date   BREAST BIOPSY Bilateral 09/08/14   neg   BREAST BIOPSY Left 03/16/2015   Procedure: BREAST BIOPSY WITH NEEDLE LOCALIZATION;  Surgeon: Reyes LELON Dessa, MD;  Location: [facility] OR[facility];  Service: General;  Laterality: Left;   BREAST BIOPSY Left 11/2017   positive   BREAST CYST ASPIRATION Left 06/18/2017   cyst aspiration   BREAST EXCISIONAL BIOPSY Left 09/2014   BREAST EXCISIONAL BIOPSY Right    age 33's   BREAST RECONSTRUCTION WITH PLACEMENT OF TISSUE EXPANDER AND FLEX HD (ACELLULAR HYDRATED DERMIS) Left 05/20/2018   Procedure: BREAST RECONSTRUCTION WITH PLACEMENT OF TISSUE EXPANDER AND FLEX HD (ACELLULAR HYDRATED DERMIS);  Surgeon: Lowery Estefana RAMAN, DO;  Location: [facility];  Service: Plastics;  Laterality: Left;   BREAST RECONSTRUCTION WITH PLACEMENT OF TISSUE EXPANDER AND FLEX HD (ACELLULAR HYDRATED DERMIS) Right 12/09/2018   Procedure: BREAST RECONSTRUCTION WITH PLACEMENT OF TISSUE EXPANDER AND FLEX HD (ACELLULAR HYDRATED DERMIS);  Surgeon: Lowery Estefana RAMAN, DO;  Location: [facility];  Service: Plastics;  Laterality: Right;   BREAST SURGERY Right March 2000   benign fibroadenoma   BREAST SURGERY Left 08/08/13   excision   BREAST SURGERY Left 09/23/14   excision   COLONOSCOPY WITH PROPOFOL  N/A 09/28/2021   Procedure: COLONOSCOPY WITH PROPOFOL ;  Surgeon: Dessa Reyes ORN, MD;  Location: [facility];  Service: Endoscopy;  Laterality: N/A;   ESOPHAGOGASTRODUODENOSCOPY (EGD) WITH PROPOFOL  N/A 01/20/2019   Procedure: ESOPHAGOGASTRODUODENOSCOPY (EGD) WITH PROPOFOL ;  Surgeon: Unk Corinn Skiff, MD;  Location: [facility];  Service: Gastroenterology;  Laterality: N/A;   MASTECTOMY Left 05/20/2018   MASTECTOMY W/ SENTINEL NODE BIOPSY Left 05/20/2018   Procedure: MASTECTOMY WITH SENTINEL LYMPH NODE BIOPSY;  Surgeon: Dessa Reyes ORN, MD;  Location: [facility];  Service: General;   Laterality: Left;   PORT-A-CATH REMOVAL Right 03/31/2019   Procedure: REMOVAL PORT-A-CATH;  Surgeon: Lowery Estefana RAMAN, DO;  Location: [facility];  Service: Plastics;  Laterality: Right;   PORTACATH PLACEMENT Right 12/17/2017   Procedure: INSERTION PORT-A-CATH;  Surgeon: Dessa Reyes ORN, MD;  Location: [facility];  Service: General;  Laterality: Right;   REMOVAL OF BILATERAL TISSUE EXPANDERS WITH PLACEMENT OF BILATERAL BREAST IMPLANTS Bilateral 03/31/2019   Procedure: REMOVAL OF BILATERAL TISSUE EXPANDERS WITH PLACEMENT OF BILATERAL BREAST IMPLANTS;  Surgeon: Lowery Estefana RAMAN, DO;  Location: [facility];  Service: Plastics;  Laterality: Bilateral;  2 hours   SIMPLE MASTECTOMY WITH AXILLARY SENTINEL NODE BIOPSY Right 12/09/2018   Procedure: SIMPLE MASTECTOMY RIGHT;  Surgeon: Dessa Reyes ORN, MD;  Location: [facility];  Service: General;  Laterality: Right;    SOCIAL HISTORY: Social History   Socioeconomic History   Marital status: Married    Spouse name: Not on file   Number of children: Not on file   Years of education: Not on file   Highest education level: Not on file  Occupational History   Not on file  Tobacco Use   Smoking status: Never   Smokeless tobacco: Never  Vaping Use   Vaping status: Never Used  Substance and Sexual Activity   Alcohol use: Yes    Comment: occasionally   Drug use: No   Sexual activity: Yes    Birth control/protection: I.U.D.    Comment: Paragard   Other Topics Concern   Not on file  Social History Narrative   Not on file   Social Drivers of Health   Tobacco Use: Low Risk (06/26/2024)   Patient History    Smoking Tobacco Use: Never    Smokeless Tobacco Use: Never    Passive Exposure: Not on file  Financial Resource Strain: Not on file  Food Insecurity: Not on file  Transportation Needs: Not on file  Physical Activity: Not on file  Stress: Not on file  Social Connections: Not on file  Intimate Partner Violence: Not on file  Depression  (PHQ2-9): Low Risk (06/26/2024)   Depression (PHQ2-9)    PHQ-2 Score: 0  Alcohol Screen: Not on file  Housing: Not on file  Utilities: Not on file  Health Literacy: Not on file    FAMILY HISTORY: Family History  Problem Relation Age of Onset   Skin cancer Mother    Prostate cancer Father 68       requiring tx/hormones   Depression Sister    Breast cancer Paternal Grandmother 26   Breast cancer Maternal Aunt 60   Ovarian cancer Maternal Aunt 70   Pancreatic cancer Maternal Aunt 75   Breast cancer Paternal Aunt 61  Stroke Maternal Grandmother    Stroke Maternal Grandfather    Alcohol abuse Paternal Grandfather    Prostate cancer Maternal Uncle    Alcohol abuse Paternal Uncle    Anxiety disorder Paternal Uncle     ALLERGIES:  is allergic to steri-strip compound benzoin [benzoin], meloxicam , silicone, and tape.  MEDICATIONS:  Current Outpatient Medications  Medication Sig Dispense Refill   acetaminophen  (TYLENOL ) 500 MG tablet Take 1,000 mg by mouth every 6 (six) hours as needed for moderate pain or headache.      Calcium Carbonate (CALCIUM 600 PO) Take 1 tablet by mouth daily.     cholecalciferol (VITAMIN D3) 25 MCG (1000 UNIT) tablet Take 3,000 Units by mouth daily.     escitalopram  (LEXAPRO ) 20 MG tablet Take 1 tablet by mouth once daily (Patient not taking: Reported on 06/26/2024) 90 tablet 0   loratadine (CLARITIN) 10 MG tablet Take 10 mg by mouth daily. (Patient not taking: Reported on 06/26/2024)     tamoxifen  (NOLVADEX ) 20 MG tablet Take 1 tablet (20 mg total) by mouth daily. (Patient not taking: Reported on 06/26/2024) 90 tablet 3   vitamin C (ASCORBIC ACID) 500 MG tablet Take 500 mg by mouth daily. (Patient not taking: Reported on 06/26/2024)     Current Facility-Administered Medications  Medication Dose Route Frequency Provider Last Rate Last Admin   paragard  intrauterine copper  IUD   Intrauterine Once Copland, Alicia B, PA-C       Facility-Administered Medications Ordered  in Other Visits  Medication Dose Route Frequency Provider Last Rate Last Admin   sodium chloride  flush (NS) 0.9 % injection 10 mL  10 mL Intravenous PRN Corcoran, Melissa C, MD   10 mL at 04/17/18 0844      .  PHYSICAL EXAMINATION: ECOG PERFORMANCE STATUS: 0 - Asymptomatic  Vitals:   06/26/24 1504  BP: 126/81  Pulse: 76  Resp: 16  Temp: (!) 97.3 F (36.3 C)  SpO2: 100%   Filed Weights   06/26/24 1504  Weight: 184 lb (83.5 kg)    Physical Exam Vitals reviewed.  Constitutional:      Appearance: She is not ill-appearing.     Comments: Alone; ambulating independently  HENT:     Head: Normocephalic and atraumatic.     Mouth/Throat:     Mouth: Mucous membranes are moist.     Pharynx: Oropharynx is clear. No oropharyngeal exudate.  Cardiovascular:     Rate and Rhythm: Normal rate and regular rhythm.  Pulmonary:     Effort: No respiratory distress.     Breath sounds: No wheezing.  Chest:     Comments: S/p bilateral mastectomy with reconstructed breasts. Symmetrical, non-tender, no suspicious masses, skin or nipple changes. No lymphadenopathy.  Abdominal:     General: There is no distension.     Palpations: Abdomen is soft.     Tenderness: There is no abdominal tenderness. There is no guarding.  Musculoskeletal:        General: No tenderness or deformity.  Lymphadenopathy:     Cervical: No cervical adenopathy.  Skin:    General: Skin is warm.     Coloration: Skin is not pale.  Neurological:     Mental Status: She is alert and oriented to person, place, and time.  Psychiatric:        Mood and Affect: Mood and affect normal.        Behavior: Behavior normal.     LABORATORY DATA:  I have reviewed the data as listed  Lab Results  Component Value Date   WBC 6.8 06/24/2024   HGB 12.4 06/24/2024   HCT 37.4 06/24/2024   MCV 84.2 06/24/2024   PLT 222 06/24/2024   Recent Labs    12/14/23 1422 06/24/24 1528  NA 137 138  K 4.1 4.1  CL 105 102  CO2 25 25   GLUCOSE 99 79  BUN 13 11  CREATININE 0.77 0.69  CALCIUM 8.9 9.1  GFRNONAA >60 >60  PROT 7.1 7.2  ALBUMIN 4.0 4.3  AST 23 23  ALT 22 18  ALKPHOS 33* 43  BILITOT 0.7 0.5   Component Ref Range & Units (hover) 2 d ago (06/24/24) 6 mo ago (12/14/23) 1 yr ago (06/07/23) 1 yr ago (11/28/22) 2 yr ago (05/29/22) 2 yr ago (03/03/22) 2 yr ago (11/29/21)  CA 27.29 23.3 26.1 CM 19.3 CM 14.0 CM 18.7 CM 18.4 CM 23.1    Component Ref Range & Units (hover) 2 d ago (06/24/24) 6 mo ago (12/14/23) 1 yr ago (06/07/23) 1 yr ago (11/28/22) 2 yr ago (05/29/22) 2 yr ago (11/29/21)  Vit D, 25-Hydroxy 49.6 50.05 CM 71.17 CM 48.03 CM 55.98 CM 38.35     RADIOGRAPHIC STUDIES: I have personally reviewed the radiological images as listed and agreed with the findings in the report. No results found.  ASSESSMENT & PLAN:   arcinoma of upper-outer quadrant of left breast in female, estrogen receptor positive  # 2019 Stage IB multifocal LEFT breast cancer ER-positive; Her2 positive- on Tamoxifen  [started in  Fall 2020].  S/p left mastectomy; right prophylactic mastectomy. July 2022 CT scan chest and pelvis negative for any metastatic disease; bilateral mastectomies and mild scarring noted in the left upper lobe/radiation-induced likely.   Stable.     # Patient continues to be on adjuvant tamoxifen  [until Fall of 2030].  No concerns for any recurrence. Completed adjuvant zometa  - #1 on Nov 14th, 2020. Completed 05/2023. No jaw pain.  Calcium normal.    # Tumor marker: Within normal limits; however up-and-down.  Informed the patient that this is unlikely to be concerning.  Would recommend holding off on imaging unless significantly elevated or an abnormal trend noted.  Reviewed symptoms that would be concerning for recurrence and warrant sooner return.    # Hot flashes-G-1-2;  [Effexor  -dizziness]; currently on Lexapro  20 mg a day- stable.    #  Premenopausal [July 2022]-pelvic ultrasound [IUD-cystic changes in the  endometrium] in place; yearly follow-up with gynecology. Encouraged her to follow up with gyn. Reviewed risk of endometrial hyperplasia with tamoxifen . No abnormal bleeding currently.    # CHEK- 2 mutations: s/p  April 11th, 2023- colonoscopy- NEG [Dr.Byrnett]-reviewed normal.    # Screening for osteoporosis-JUNE 2023- The BMD T-score of 0.0.  Normal continue calcium plus vitamin D  - goal > 50. Currently on 5000 mcg daily. Check BMD in 12 month- ordered.    # Bil hand T/numbess: carpal tunnel- discussed re: Splint vs orthopedic surgery. Defer to PCP.    [#1 on Nov 14th, 2020- x7 STOP]   my chart  # DISPOSITION: Bone density June 2026 6 mo- labs (cbc, cmp, ca 27-29, vit d) Few days later- Dr Rennie- la  No problem-specific Assessment & Plan notes found for this encounter.  All questions were answered. The patient knows to call the clinic with any problems, questions or concerns.   Tinnie KANDICE Dawn, NP 06/26/2024

## 2024-07-09 NOTE — Progress Notes (Signed)
 " History of Present Illness:   Denise [name] is a 49 y.o. female here for acute onset of lower abdominal pain this morning.  She states that it started around 11:15 AM this morning.  It lasted for about 30 to 40 minutes.  She describes it as sharp, and constant.  It then resolved.  She denies any pain at this time.  She denies any specific trigger.  She denies any urinary symptoms.  She denies any nausea or vomiting.  She denies any change in bowel habits.  She denies any abdominal surgeries.  She still has her appendix.  She denies any new foods.  She denies any fever or chills.  She denies any sick contacts.  She has never experienced this before.   Past Medical History:   Past Medical History:  Diagnosis Date   Cancer (CMS/HHS-HCC) 05/20/2018   Left breast clinical T1c,N1 prior to neo-adjuvant chemotherapy. Post neoadjuvant chemotherapy, complete resolution of breast primary, positive axillary nodes 2/3.  Whole breast radiation.   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   Fibroadenoma 2016   left breast   Genetic screening 08/26/2013   Monoallelic mutation of CHEK2 gene in female patient 2017   Personal history of chemotherapy    Personal history of radiation therapy     Past Surgical History:   Past Surgical History:  Procedure Laterality Date   BREAST EXCISIONAL BIOPSY Right 08/1998   BREAST EXCISIONAL BIOPSY Left 08/08/2013   BREAST EXCISIONAL BIOPSY Bilateral 09/08/2014   OPEN EXCISION BREAST LESION Left 09/2014   PERCUTANEOUS BIOPSY BREAST W/NEEDLE LOCALIZATION  03/16/2015   BREAST EXCISIONAL BIOPSY Left 11/2017   port a cath placement Right 12/17/2017   MASTECTOMY SIMPLE Left 05/20/2018   No residual cancer post neoadjuvant, 1/3 lymph nodes positive, microscopic foci measuring 1.0 and 2.1 cm in a single node) C.  Whole breast radiation.   breast reconstruction with placement of tissue expander and fled hd Left 05/20/2018    MASTECTOMY SIMPLE Right 12/09/2018   with axillary sentinel node biopsy sclerosing adenosis.  Prophylactic.   breast reconstruction with placement of tissue expander adn fled hd Right 12/09/2018   EGD  01/20/2019   For history of melena, completed by Dr. Vanco.  No bleeding source identified.   removal of bilateral tissue expanders with placement of bilateral breast implants  03/31/2019   port a cath removal Right 03/31/2019    Allergies:   Allergies  Allergen Reactions   Benzoin Other (See Comments)    Steri-strip ,   Benzoin Compound Rash    Steri-strip ,    Other Unknown    Steri-strip compound benzoin   Adhesive Other (See Comments)    Minor skin irritation   Adhesive Tape-Silicones Other (See Comments)    Minor skin irritation Minor skin irritation  Minor skin irritation   Meloxicam  Rash   Silicone Rash    Minor skin irritation  Minor skin irritation    Minor skin irritation  Minor skin irritation  Minor skin irritation    Minor skin irritation  Minor skin irritation Minor skin irritation  Minor skin irritation    Current Medications:   Prior to Admission medications  Medication Sig Taking? Last Dose  acetaminophen  (TYLENOL ) 500 MG tablet Take 500 mg by mouth every 6 (six) hours as needed for Pain Yes PRN Not Currently Taking  cholecalciferol (VITAMIN D3) 1000 unit tablet Take by mouth Yes Taking  copper  intrauterine contraceptive (PARAGARD  T 380A) IUD  Insert into the uterus Yes Taking  escitalopram  oxalate (LEXAPRO ) 20 MG tablet Take 20 mg by mouth once daily Yes Taking  tamoxifen  (NOLVADEX ) 20 MG tablet Take 20 mg by mouth Yes Taking  ascorbic acid (VITAMIN C ORAL) Take by mouth once daily Patient not taking: Reported on 07/09/2024  Not Taking  bisacodyL (DULCOLAX) 5 mg EC tablet Take two tablets morning and two tablets afternoon day prior to Miralax  prep. Patient not taking: Reported on 07/09/2024  Not Taking  blood glucose diagnostic test  strip Use 1 each (1 strip total) once daily Use as instructed. Patient not taking: Reported on 09/25/2019    blood glucose meter kit Use as directed Patient not taking: Reported on 09/25/2019    CALCIUM ORAL Take by mouth once daily Patient not taking: Reported on 07/09/2024  Not Taking  diazePAM  (VALIUM ) 2 MG tablet TAKE 1 TABLET BY MOUTH EVERY 12 HOURS AS NEEDED FOR MUSCLE SPASM Patient not taking: Reported on 07/09/2024  Not Taking  lancing device with lancets kit Use 1 each once daily Use as instructed. Patient not taking: Reported on 09/25/2019    loratadine (CLARITIN) 10 mg tablet Take 10 mg by mouth once daily Patient not taking: Reported on 07/09/2024  Not Taking  metoprolol  succinate (TOPROL -XL) 25 MG XL tablet Take 1 tablet (25 mg total) by mouth once daily Patient not taking: Reported on 07/09/2024  Not Taking  omeprazole  (PRILOSEC ) 20 MG DR capsule Take 20 mg by mouth once daily as needed Patient not taking: Reported on 07/09/2024  Not Taking  polyethylene glycol (MIRALAX ) powder One bottle for colonoscopy prep. Use as directed. Patient not taking: Reported on 07/09/2024  Not Taking  venlafaxine  (EFFEXOR -XR) 37.5 MG XR capsule Take by mouth Patient not taking: Reported on 07/09/2024  Not Taking    Family History:   Family History  Problem Relation Name Age of Onset   Skin cancer Mother Berle Charity    Prostate cancer Father Dempsey Charity    Depression Sister Lamarr Charity    Stroke Maternal Grandmother Delayne Haas    Stroke Maternal Grandfather Kayla Haas    Breast cancer Paternal Grandmother Rojelio Charity    Alcohol abuse Paternal Grandfather Zachary Charity    Breast cancer Maternal Aunt Gwenlyn Alert    Ovarian cancer Maternal Aunt Gwenlyn Alert    Pancreatic cancer Maternal Aunt     Prostate cancer Maternal Uncle Kayla Haas    Breast cancer Paternal Aunt Sonny Lindau    Alcohol abuse Paternal Uncle Cathlyn Charity    Anxiety  Paternal Uncle Alm Charity     Social History:   Social History   Socioeconomic History   Marital status: Married  Tobacco Use   Smoking status: Never   Smokeless tobacco: Never  Vaping Use   Vaping status: Never Used  Substance and Sexual Activity   Alcohol use: Not Currently   Drug use: Never   Sexual activity: Yes    Partners: Male    Birth control/protection: Condom   Social Drivers of Health   Financial Resource Strain: Low Risk (01/08/2019)   Received from [facility]   Overall Financial Resource Strain (CARDIA)    Difficulty of Paying Living Expenses: Not very hard  Food Insecurity: No Food Insecurity (01/08/2019)   Received from [facility]   Hunger Vital Sign    Within the past 12 months, you worried that your food would run out before you got the money to buy more.: Never true  Within the past 12 months, the food you bought just didn't last and you didn't have money to get more.: Never true  Transportation Needs: Unknown (01/08/2019)   Received from [facility] - Transportation    Lack of Transportation (Medical): No  Physical Activity: Sufficiently Active (08/06/2017)   Received from [facility]   Exercise Vital Sign    Days of Exercise per Week: 3 days    Minutes of Exercise per Session: 60 min  Stress: No Stress Concern Present (08/06/2017)   Received from [facility] - Occupational Stress Questionnaire    Feeling of Stress : Only a little  Social Connections: Moderately Integrated (08/06/2017)   Received from [facility]   Social Connection and Isolation Panel    Frequency of Communication with Friends and Family: Twice a week    Frequency of Social Gatherings with Friends and Family: Once a week    Attends Religious Services: More than 4 times per year    Active Member of [employer] or Organizations: No    Attends Banker Meetings: Never    Marital Status: Married   Housing Stability: Unknown (07/09/2024)   Housing Stability Vital Sign    Homeless in the Last Year: No    Review of Systems:   Review of Systems  Constitutional:  Negative for appetite change, chills and fever.  HENT:  Negative for trouble swallowing.   Gastrointestinal:  Positive for abdominal pain. Negative for constipation, diarrhea, nausea and vomiting.  All other systems reviewed and are negative.   Vitals:   Vitals:   07/09/24 1300  BP: 132/81  Pulse: 75  Temp: 36.8 C (98.2 F)  TempSrc: Oral  SpO2: 99%  Weight: 83.4 kg (183 lb 12.8 oz)  Height: 170.2 cm (5' 7)     Body mass index is 28.79 kg/m.  Physical Exam:   Physical Exam Vitals and nursing note reviewed.  Constitutional:      Appearance: She is well-developed.  HENT:     Head: Normocephalic and atraumatic.     Right Ear: Hearing, tympanic membrane, ear canal and external ear normal.     Left Ear: Hearing, tympanic membrane, ear canal and external ear normal.     Nose: Nose normal.     Mouth/Throat:     Pharynx: Oropharynx is clear. Uvula midline.  Eyes:     General: Lids are normal. Vision grossly intact.     Conjunctiva/sclera: Conjunctivae normal.     Pupils: Pupils are equal, round, and reactive to light.  Cardiovascular:     Rate and Rhythm: Normal rate and regular rhythm.     Pulses: Normal pulses.     Heart sounds: Normal heart sounds. No murmur heard. Pulmonary:     Effort: Pulmonary effort is normal.     Breath sounds: Normal breath sounds. No wheezing.  Abdominal:     General: Bowel sounds are normal.     Palpations: Abdomen is soft.     Tenderness: There is abdominal tenderness in the suprapubic area. There is no guarding or rebound. Negative signs include Murphy's sign and McBurney's sign.     Comments: Gastrointestinal system examined as above.  Musculoskeletal:        General: Normal range of motion.     Cervical back: Full passive range of motion without pain, normal range of  motion and neck supple.  Skin:    General: Skin is warm and dry.  Neurological:  General: No focal deficit present.     Mental Status: She is alert and oriented to person, place, and time.  Psychiatric:        Attention and Perception: Attention and perception normal.        Mood and Affect: Mood and affect normal.        Speech: Speech normal.        Behavior: Behavior normal. Behavior is cooperative.        Thought Content: Thought content normal.     Assessment and Plan:   Results for orders placed or performed in visit on 07/09/24  X-ray abdomen flat and upright   Narrative   EXAM: X-ray abdomen flat and upright  INDICATION:   Bilateral lower abdominal pain [R10.31, R10.32 (ICD-10-CM)]  COMPARISON: None  FINDINGS: No acute osseous abnormality. Visualized lung bases are clear. Focal distended loop of bowel in the left upper quadrant. There is gas and stool throughout the colon. No free intraperitoneal air.    Impression   Focal gas distended loop of bowel in the left upper quadrant, with gas and  stool throughout the colon.  Differential includes focal ileus as well as  early small bowel obstruction.  If symptoms persist, recommend further  assessment with CT.  BRENDAN CHRISTOPHER CLINE, MD     Urinalysis w/Microscopic  Result Value Ref Range   Color Light Yellow Colorless, Straw, Light Yellow, Yellow, Dark Yellow   Clarity Clear Clear   Specific Gravity 1.024 1.005 - 1.030   pH, Urine 5.0 5.0 - 8.0   Protein, Urinalysis Negative Negative mg/dL   Glucose, Urinalysis Negative Negative mg/dL   Ketones, Urinalysis Negative Negative mg/dL   Blood, Urinalysis Negative Negative   Nitrite, Urinalysis Negative Negative   Leukocyte Esterase, Urinalysis Negative Negative   Bilirubin, Urinalysis Negative Negative   Urobilinogen, Urinalysis 0.2 0.2 - 1.0 mg/dL   WBC, UA 2 <=5 /hpf   Red Blood Cells, Urinalysis <1 <=3 /hpf   Bacteria, Urinalysis 0-5 0 - 5 /hpf    Squamous Epithelial Cells, Urinalysis 2 /hpf  CBC w/auto Differential (5 Part)  Result Value Ref Range   WBC (White Blood Cell Count) 10.0 4.1 - 10.2 103/uL   RBC (Red Blood Cell Count) 4.74 4.04 - 5.48 106/uL   Hemoglobin 13.3 12.0 - 15.0 gm/dL   Hematocrit 59.3 64.9 - 47.0 %   MCV (Mean Corpuscular Volume) 85.7 80.0 - 100.0 fl   MCH (Mean Corpuscular Hemoglobin) 28.1 27.0 - 31.2 pg   MCHC (Mean Corpuscular Hemoglobin Concentration) 32.8 32.0 - 36.0 gm/dL   Platelet Count 745 849 - 450 103/uL   RDW-CV (Red Cell Distribution Width) 14.1 11.6 - 14.8 %   MPV (Mean Platelet Volume) 10.5 9.4 - 12.4 fl   Neutrophils 6.52 1.50 - 7.80 103/uL   Lymphocytes 2.74 1.00 - 3.60 103/uL   Monocytes 0.60 0.00 - 1.50 103/uL   Eosinophils 0.12 0.00 - 0.55 103/uL   Basophils 0.04 0.00 - 0.09 103/uL   Neutrophil % 64.9 32.0 - 70.0 %   Lymphocyte % 27.3 10.0 - 50.0 %   Monocyte % 6.0 4.0 - 13.0 %   Eosinophil % 1.2 1.0 - 5.0 %   Basophil% 0.4 0.0 - 2.0 %   Immature Granulocyte % 0.2 <=0.7 %   Immature Granulocyte Count 0.02 <=0.06 10^3/L    Diagnoses and all orders for this visit:  Bilateral lower abdominal pain -     Urinalysis w/Microscopic -     Urine  Culture, Routine - [facility] -     X-ray abdomen flat and upright -     CBC w/auto Differential (5 Part)  NOTE: xray reviewed by me; air-fluid levels with large gas bubble noted on my review; await official report   Patient Instructions  As we discussed, your workup today including labs, and x-ray, were reassuring.  This seems to be an isolated episode.  This could have been gas or gastritis.  Recommend aggressive hydration.  Present to the ER immediately should symptoms return, change, or worsen in any way.  Portions of this note were created using dictation software and may contain typographical errors.   Patient received an After Visit Summary    "

## 2024-07-15 ENCOUNTER — Other Ambulatory Visit: Payer: Self-pay | Admitting: Nurse Practitioner

## 2024-07-16 ENCOUNTER — Encounter: Payer: Self-pay | Admitting: Nurse Practitioner

## 2024-07-16 ENCOUNTER — Other Ambulatory Visit: Payer: Self-pay | Admitting: *Deleted

## 2024-07-16 DIAGNOSIS — Z1589 Genetic susceptibility to other disease: Secondary | ICD-10-CM

## 2024-07-16 DIAGNOSIS — Z5181 Encounter for therapeutic drug level monitoring: Secondary | ICD-10-CM

## 2024-07-16 DIAGNOSIS — C50419 Malignant neoplasm of upper-outer quadrant of unspecified female breast: Secondary | ICD-10-CM

## 2024-07-16 DIAGNOSIS — Z08 Encounter for follow-up examination after completed treatment for malignant neoplasm: Secondary | ICD-10-CM

## 2024-07-16 MED ORDER — TAMOXIFEN CITRATE 20 MG PO TABS: 20.0000 mg | ORAL_TABLET | Freq: Every day | ORAL | 3 refills | Status: AC
